# Patient Record
Sex: Female | Born: 1935 | Race: Black or African American | Hispanic: No | Smoking: Never smoker
Health system: Southern US, Community
[De-identification: ages and names within clinical notes are randomized; demographics above are authoritative.]

## PROBLEM LIST (undated history)

## (undated) DIAGNOSIS — R062 Wheezing: Secondary | ICD-10-CM

## (undated) DIAGNOSIS — I10 Essential (primary) hypertension: Secondary | ICD-10-CM

## (undated) DIAGNOSIS — R6251 Failure to thrive (child): Secondary | ICD-10-CM

## (undated) DIAGNOSIS — E785 Hyperlipidemia, unspecified: Secondary | ICD-10-CM

## (undated) DIAGNOSIS — R0600 Dyspnea, unspecified: Secondary | ICD-10-CM

## (undated) DIAGNOSIS — M810 Age-related osteoporosis without current pathological fracture: Secondary | ICD-10-CM

## (undated) DIAGNOSIS — R1900 Intra-abdominal and pelvic swelling, mass and lump, unspecified site: Secondary | ICD-10-CM

## (undated) DIAGNOSIS — G629 Polyneuropathy, unspecified: Secondary | ICD-10-CM

## (undated) HISTORY — DX: Hyperlipidemia, unspecified: E78.5

## (undated) HISTORY — PX: COLONOSCOPY: SHX174

## (undated) HISTORY — DX: Essential (primary) hypertension: I10

## (undated) HISTORY — PX: ABDOMINAL HYSTERECTOMY: SHX81

## (undated) HISTORY — PX: MULTIPLE TOOTH EXTRACTIONS: SHX2053

## (undated) HISTORY — DX: Age-related osteoporosis without current pathological fracture: M81.0

## (undated) HISTORY — PX: COLON SURGERY: SHX602

---

## 2005-12-18 HISTORY — PX: GLAUCOMA SURGERY: SHX656

## 2009-03-16 ENCOUNTER — Encounter: Admission: RE | Admit: 2009-03-16 | Discharge: 2009-03-16 | Payer: Self-pay | Admitting: Internal Medicine

## 2009-10-04 ENCOUNTER — Ambulatory Visit (HOSPITAL_COMMUNITY): Admission: RE | Admit: 2009-10-04 | Discharge: 2009-10-04 | Payer: Self-pay | Admitting: Ophthalmology

## 2009-12-23 ENCOUNTER — Ambulatory Visit: Payer: Self-pay | Admitting: Family Medicine

## 2010-01-18 HISTORY — PX: CATARACT EXTRACTION: SUR2

## 2010-03-23 ENCOUNTER — Encounter: Admission: RE | Admit: 2010-03-23 | Discharge: 2010-03-23 | Payer: Self-pay | Admitting: Family Medicine

## 2010-03-23 ENCOUNTER — Ambulatory Visit: Payer: Self-pay | Admitting: Family Medicine

## 2010-07-22 ENCOUNTER — Ambulatory Visit: Payer: Self-pay | Admitting: Family Medicine

## 2010-07-29 ENCOUNTER — Encounter: Admission: RE | Admit: 2010-07-29 | Discharge: 2010-07-29 | Payer: Self-pay | Admitting: Family Medicine

## 2010-07-29 ENCOUNTER — Ambulatory Visit: Payer: Self-pay | Admitting: Family Medicine

## 2010-11-07 ENCOUNTER — Ambulatory Visit: Payer: Self-pay | Admitting: Family Medicine

## 2011-03-08 ENCOUNTER — Other Ambulatory Visit: Payer: Self-pay | Admitting: Family Medicine

## 2011-03-08 DIAGNOSIS — Z1231 Encounter for screening mammogram for malignant neoplasm of breast: Secondary | ICD-10-CM

## 2011-04-04 ENCOUNTER — Ambulatory Visit
Admission: RE | Admit: 2011-04-04 | Discharge: 2011-04-04 | Disposition: A | Discharge status: Home / Self Care | Payer: Medicare Other | Source: Ambulatory Visit | Attending: Family Medicine | Admitting: Family Medicine

## 2011-04-04 DIAGNOSIS — Z1231 Encounter for screening mammogram for malignant neoplasm of breast: Secondary | ICD-10-CM

## 2011-04-13 ENCOUNTER — Encounter: Payer: Self-pay | Admitting: Family Medicine

## 2011-05-08 ENCOUNTER — Ambulatory Visit (INDEPENDENT_AMBULATORY_CARE_PROVIDER_SITE_OTHER): Payer: Medicare Other | Admitting: Family Medicine

## 2011-05-08 ENCOUNTER — Encounter: Payer: Self-pay | Admitting: Family Medicine

## 2011-05-08 DIAGNOSIS — E785 Hyperlipidemia, unspecified: Secondary | ICD-10-CM

## 2011-05-08 DIAGNOSIS — E1169 Type 2 diabetes mellitus with other specified complication: Secondary | ICD-10-CM

## 2011-05-08 DIAGNOSIS — I10 Essential (primary) hypertension: Secondary | ICD-10-CM

## 2011-05-08 DIAGNOSIS — E119 Type 2 diabetes mellitus without complications: Secondary | ICD-10-CM

## 2011-05-08 DIAGNOSIS — E1159 Type 2 diabetes mellitus with other circulatory complications: Secondary | ICD-10-CM

## 2011-05-08 LAB — POCT GLYCOSYLATED HEMOGLOBIN (HGB A1C): Hemoglobin A1C: 6.1

## 2011-05-08 MED ORDER — MAGNESIUM OXIDE 400 MG PO TABS: 400.0000 mg | ORAL_TABLET | Freq: Every day | ORAL | Status: AC

## 2011-05-08 MED ORDER — GLIMEPIRIDE 2 MG PO TABS: 2.0000 mg | ORAL_TABLET | ORAL | Status: DC

## 2011-05-08 MED ORDER — PRAVASTATIN SODIUM 40 MG PO TABS: 40.0000 mg | ORAL_TABLET | Freq: Every evening | ORAL | Status: DC

## 2011-05-08 MED ORDER — METFORMIN HCL 1000 MG PO TABS: 1000.0000 mg | ORAL_TABLET | Freq: Every day | ORAL | Status: DC

## 2011-05-08 MED ORDER — VALSARTAN-HYDROCHLOROTHIAZIDE 160-12.5 MG PO TABS: 1.0000 | ORAL_TABLET | Freq: Every day | ORAL | Status: DC

## 2011-05-08 NOTE — Progress Notes (Signed)
Subjective:    Patient ID: Theresa Howe, female    DOB: [birth year], 77 y.o.   MRN: [id number]  HPI she is here for recheck on her diabetes. Her medications were reviewed. She is having no difficulty with them. She checks her blood sugars daily and they run below 100. She has had an exam within the last several months. She checks her feet regularly. She does not smoke or drink. She walks 2 miles a day. She is been retired since 1998    Review of Systems negative except as above     Objective:   Physical Exam alert and in no distress otherwise not examined        Assessment & Plan:  Diabetes. Hyperlipidemia. Hypertension Will switch her to Diovan HCT since there is no good reason when she is on spironolactone . Continue on her other medications. Recheck here in 4 months.

## 2011-05-08 NOTE — Patient Instructions (Signed)
Stop the Aldactone. I will switch to  Diovan/hct. I will renew all your other medications. Checking your feet and your blood sugar

## 2011-09-08 ENCOUNTER — Ambulatory Visit: Payer: No Typology Code available for payment source | Admitting: Family Medicine

## 2011-09-18 ENCOUNTER — Ambulatory Visit (INDEPENDENT_AMBULATORY_CARE_PROVIDER_SITE_OTHER): Payer: Medicare Other | Admitting: Family Medicine

## 2011-09-18 ENCOUNTER — Encounter: Payer: Self-pay | Admitting: Family Medicine

## 2011-09-18 ENCOUNTER — Ambulatory Visit: Payer: No Typology Code available for payment source | Admitting: Family Medicine

## 2011-09-18 DIAGNOSIS — H409 Unspecified glaucoma: Secondary | ICD-10-CM | POA: Insufficient documentation

## 2011-09-18 DIAGNOSIS — E1159 Type 2 diabetes mellitus with other circulatory complications: Secondary | ICD-10-CM

## 2011-09-18 DIAGNOSIS — IMO0001 Reserved for inherently not codable concepts without codable children: Secondary | ICD-10-CM | POA: Insufficient documentation

## 2011-09-18 DIAGNOSIS — E785 Hyperlipidemia, unspecified: Secondary | ICD-10-CM

## 2011-09-18 DIAGNOSIS — I10 Essential (primary) hypertension: Secondary | ICD-10-CM | POA: Insufficient documentation

## 2011-09-18 DIAGNOSIS — Z79899 Other long term (current) drug therapy: Secondary | ICD-10-CM

## 2011-09-18 DIAGNOSIS — Z23 Encounter for immunization: Secondary | ICD-10-CM

## 2011-09-18 DIAGNOSIS — E1169 Type 2 diabetes mellitus with other specified complication: Secondary | ICD-10-CM

## 2011-09-18 DIAGNOSIS — E119 Type 2 diabetes mellitus without complications: Secondary | ICD-10-CM

## 2011-09-18 LAB — CBC WITH DIFFERENTIAL/PLATELET
Eosinophils Absolute: 0 10*3/uL (ref 0.0–0.7)
Eosinophils Relative: 1 % (ref 0–5)
Hemoglobin: 11.8 g/dL — ABNORMAL LOW (ref 12.0–15.0)
Lymphocytes Relative: 41 % (ref 12–46)
Lymphs Abs: 1.3 10*3/uL (ref 0.7–4.0)
Monocytes Absolute: 0.2 10*3/uL (ref 0.1–1.0)
Neutrophils Relative %: 50 % (ref 43–77)
Platelets: 269 10*3/uL (ref 150–400)
RBC: 4.2 MIL/uL (ref 3.87–5.11)
WBC: 3.3 10*3/uL — ABNORMAL LOW (ref 4.0–10.5)

## 2011-09-18 LAB — POCT UA - MICROALBUMIN
Albumin/Creatinine Ratio, Urine, POC: <16.6
Microalbumin Ur, POC: <5 mg/dL

## 2011-09-18 LAB — POCT GLYCOSYLATED HEMOGLOBIN (HGB A1C): Hemoglobin A1C: 6.1

## 2011-09-18 NOTE — Progress Notes (Signed)
Subjective:    Patient ID: Theresa Howe, female    DOB: January 25, 1931, 77 y.o.   MRN: [id number]  HPI She is here for a diabetes recheck. She continues on medications listed in the chart. Her blood sugars run quite low. She does not smoke or drink. She keeps himself fairly active. She does check her blood sugars regularly. Does have an appointment to see her eye doctor concerning her glaucoma. She has no other concerns or complaints.   Review of Systems     Objective:   Physical Exam Alert and in no distress. Exam of her feet shows good pulses. Skin is normal. Ankle reflex diminished. Hemoglobin A1c 6.1       Assessment & Plan:   1. Diabetes mellitus  POCT HgB A1C, POCT UA - Microalbumin, CBC with Differential, Comprehensive metabolic panel, Lipid panel  2. Need for prophylactic vaccination and inoculation against influenza  Flu vaccine greater than or equal to 3yo preservative free IM  3. Hypertension associated with diabetes  CBC with Differential, Comprehensive metabolic panel, Lipid panel  4. Hyperlipidemia LDL goal <70  Lipid panel  5. Glaucoma    6. Encounter for long-term (current) use of other medications  CBC with Differential, Comprehensive metabolic panel, Lipid panel   continue to take excellent care of herself. Return here in 4 months

## 2011-09-18 NOTE — Patient Instructions (Signed)
Continue take good care of yourself and stay on your present medication regimen

## 2011-09-19 LAB — LIPID PANEL
Cholesterol: 187 mg/dL (ref 0–200)
LDL Cholesterol: 112 mg/dL — ABNORMAL HIGH (ref 0–99)
Total CHOL/HDL Ratio: 3 Ratio
Triglycerides: 63 mg/dL (ref <150)
VLDL: 13 mg/dL (ref 0–40)

## 2011-09-19 LAB — COMPREHENSIVE METABOLIC PANEL
AST: 21 U/L (ref 0–37)
Albumin: 4.4 g/dL (ref 3.5–5.2)
Alkaline Phosphatase: 57 U/L (ref 39–117)
BUN: 16 mg/dL (ref 6–23)
CO2: 26 mEq/L (ref 19–32)
Creat: 1.02 mg/dL (ref 0.50–1.10)
Glucose, Bld: 81 mg/dL (ref 70–99)

## 2011-09-19 MED ORDER — PRAVASTATIN SODIUM 80 MG PO TABS: 80.0000 mg | ORAL_TABLET | Freq: Every day | ORAL | Status: DC

## 2011-09-19 NOTE — Progress Notes (Signed)
Addended by: Ronnald Nian on: 09/19/2011 01:13 PM   Modules accepted: Orders

## 2011-09-19 NOTE — Progress Notes (Signed)
Subjective:    Patient ID: Theresa Howe, female    DOB: 1931/11/03, 77 y.o.   MRN: [id number]  HPI    Review of Systems     Objective:   Physical Exam        Assessment & Plan:  Lipids are not at goal. I will increase her Pravachol to 80 mg area and recheck with next office visit

## 2011-11-21 ENCOUNTER — Telehealth: Payer: Self-pay | Admitting: Family Medicine

## 2011-11-21 NOTE — Telephone Encounter (Signed)
Left message

## 2012-01-16 ENCOUNTER — Encounter: Payer: Self-pay | Admitting: Internal Medicine

## 2012-01-24 ENCOUNTER — Ambulatory Visit: Payer: Medicare Other | Admitting: Family Medicine

## 2012-01-26 ENCOUNTER — Encounter: Payer: Self-pay | Admitting: Family Medicine

## 2012-01-26 ENCOUNTER — Other Ambulatory Visit: Payer: Self-pay

## 2012-01-26 ENCOUNTER — Ambulatory Visit (INDEPENDENT_AMBULATORY_CARE_PROVIDER_SITE_OTHER): Payer: Medicare Other | Admitting: Family Medicine

## 2012-01-26 DIAGNOSIS — Z23 Encounter for immunization: Secondary | ICD-10-CM | POA: Diagnosis not present

## 2012-01-26 DIAGNOSIS — E1169 Type 2 diabetes mellitus with other specified complication: Secondary | ICD-10-CM

## 2012-01-26 DIAGNOSIS — E119 Type 2 diabetes mellitus without complications: Secondary | ICD-10-CM | POA: Diagnosis not present

## 2012-01-26 DIAGNOSIS — E785 Hyperlipidemia, unspecified: Secondary | ICD-10-CM

## 2012-01-26 DIAGNOSIS — E1159 Type 2 diabetes mellitus with other circulatory complications: Secondary | ICD-10-CM

## 2012-01-26 DIAGNOSIS — H409 Unspecified glaucoma: Secondary | ICD-10-CM

## 2012-01-26 DIAGNOSIS — I10 Essential (primary) hypertension: Secondary | ICD-10-CM

## 2012-01-26 MED ORDER — PRAVASTATIN SODIUM 80 MG PO TABS: 80.0000 mg | ORAL_TABLET | Freq: Every day | ORAL | Status: DC

## 2012-01-26 MED ORDER — VALSARTAN-HYDROCHLOROTHIAZIDE 160-12.5 MG PO TABS: 1.0000 | ORAL_TABLET | Freq: Every day | ORAL | Status: DC

## 2012-01-26 NOTE — Telephone Encounter (Signed)
Pt request 90 day

## 2012-01-26 NOTE — Patient Instructions (Signed)
Keep taking good care of yourself 

## 2012-01-26 NOTE — Progress Notes (Signed)
Subjective:    Patient ID: Theresa Howe, female    DOB: 06-22-1931, 78 y.o.   MRN: [id number]  HPI She is here for a recheck. She walks 2 miles almost everyday. She does check her sugars daily. She gets followup on her glaucoma. She continues on medications listed in the chart. She checks her feet regularly. She continues on medications listed in the chart. She did injure her head approximately one month ago. She states that it was in the mid parietal area. Since then she has had a funny sensation in that part of her head but no increased headache, blurred vision, double vision, change in orientation.   Review of Systems     Objective:   Physical Exam alert and in no distress. Tympanic membranes and canals are normal. Throat is clear. Tonsils are normal. Neck is supple without adenopathy or thyromegaly. Cardiac exam shows a regular sinus rhythm without murmurs or gallops. Lungs are clear to auscultation. DTRs 1+. Foot exam shows 1+ pulses, decreased ankle reflex, normal skin in appearance.        Assessment & Plan:   1. Diabetes mellitus  POCT HgB A1C  2. Glaucoma    3. Hyperlipidemia LDL goal <70    4. Hypertension associated with diabetes     prescription written for Zostavax with instructions on where to get the shot TDaP also given.

## 2012-02-12 ENCOUNTER — Emergency Department (HOSPITAL_COMMUNITY): Payer: Medicare Other

## 2012-02-12 ENCOUNTER — Encounter (HOSPITAL_COMMUNITY): Payer: Self-pay | Admitting: *Deleted

## 2012-02-12 ENCOUNTER — Emergency Department (HOSPITAL_COMMUNITY)
Admission: EM | Admit: 2012-02-12 | Discharge: 2012-02-12 | Disposition: A | Discharge status: Home Health | Payer: Medicare Other | Attending: Emergency Medicine | Admitting: Emergency Medicine

## 2012-02-12 DIAGNOSIS — R51 Headache: Secondary | ICD-10-CM | POA: Insufficient documentation

## 2012-02-12 DIAGNOSIS — Z7982 Long term (current) use of aspirin: Secondary | ICD-10-CM | POA: Diagnosis not present

## 2012-02-12 DIAGNOSIS — Y92009 Unspecified place in unspecified non-institutional (private) residence as the place of occurrence of the external cause: Secondary | ICD-10-CM | POA: Insufficient documentation

## 2012-02-12 DIAGNOSIS — S0990XA Unspecified injury of head, initial encounter: Secondary | ICD-10-CM | POA: Insufficient documentation

## 2012-02-12 DIAGNOSIS — E119 Type 2 diabetes mellitus without complications: Secondary | ICD-10-CM | POA: Insufficient documentation

## 2012-02-12 DIAGNOSIS — Z79899 Other long term (current) drug therapy: Secondary | ICD-10-CM | POA: Diagnosis not present

## 2012-02-12 DIAGNOSIS — H409 Unspecified glaucoma: Secondary | ICD-10-CM | POA: Diagnosis not present

## 2012-02-12 DIAGNOSIS — E785 Hyperlipidemia, unspecified: Secondary | ICD-10-CM | POA: Diagnosis not present

## 2012-02-12 DIAGNOSIS — I10 Essential (primary) hypertension: Secondary | ICD-10-CM | POA: Insufficient documentation

## 2012-02-12 DIAGNOSIS — IMO0002 Reserved for concepts with insufficient information to code with codable children: Secondary | ICD-10-CM | POA: Insufficient documentation

## 2012-02-12 MED ORDER — TETRACAINE HCL 0.5 % OP SOLN
OPHTHALMIC | Status: AC
  Administered 2012-02-12: 17:00:00
  Filled 2012-02-12: qty 2

## 2012-02-12 NOTE — ED Notes (Signed)
Family at bedside. 

## 2012-02-12 NOTE — ED Notes (Signed)
The pt says she fell one month ago and struck her head.  No loc.  She is here for a strange feeling in her head.  Steady gait alert oriented skin warm and dry  No distress

## 2012-02-12 NOTE — ED Notes (Signed)
Patient states she hit her head in her closet over a mth ago.  She has had funny feelings in her head since and is concerned that she may need an xray.  Patient denies dizziness,  Denies confusion.  She states she has pain that radiates into her neck at times.

## 2012-02-12 NOTE — Discharge Instructions (Signed)

## 2012-02-12 NOTE — ED Provider Notes (Signed)
History     CSN: [id number]  Arrival date & time 02/12/12  1219   First MD Initiated Contact with Patient 02/12/12 1559      Chief Complaint  Patient presents with  . Head Injury  . Headache    (Consider location/radiation/quality/duration/timing/severity/associated sxs/prior treatment) Patient is a 78 y.o. female presenting with headaches. The history is provided by the patient.  Headache  This is a recurrent problem. Episode onset: One month ago after hitting her head on a beam in her attic. Episode frequency: Several times a week he states her head feels funny. The problem has not changed since onset.The headache is associated with nothing. Pain location: States it's located on the top of her head. The quality of the pain is described as dull. The pain is at a severity of 5/10. The patient is experiencing no pain. The pain does not radiate. Pertinent negatives include no anorexia, no near-syncope, no palpitations, no shortness of breath, no nausea and no vomiting. She has tried aspirin for the symptoms. The treatment provided significant relief.    Past Medical History  Diagnosis Date  . Hypertension   . Hyperlipidemia   . Glaucoma   . Diabetes mellitus     Past Surgical History  Procedure Date  . Cataract extraction 01/2010    Right Eye    No family history on file.  History  Substance Use Topics  . Smoking status: Never Smoker   . Smokeless tobacco: Never Used  . Alcohol Use: No    OB History    Grav Para Term Preterm Abortions TAB SAB Ect Mult Living                  Review of Systems  Eyes: Negative for visual disturbance.       States she has glaucoma and always has eye issues but nothing different from normal  Respiratory: Negative for shortness of breath.   Cardiovascular: Negative for palpitations and near-syncope.  Gastrointestinal: Negative for nausea, vomiting and anorexia.  Neurological: Positive for headaches.  All other systems reviewed and  are negative.    Allergies  Review of patient's allergies indicates no known allergies.  Home Medications   Current Outpatient Rx  Name Route Sig Dispense Refill  . ASPIRIN 81 MG PO TABS Oral Take 81 mg by mouth daily.      Marland Kitchen CALCIUM 600 + D PO Oral Take 1 tablet by mouth daily.     . DORZOLAMIDE HCL 2 % OP SOLN Both Eyes Place 1 drop into both eyes 2 (two) times daily.    Marland Kitchen GLIMEPIRIDE 2 MG PO TABS Oral Take 1 tablet (2 mg total) by mouth every morning. 90 tablet 1  . LATANOPROST 0.005 % OP SOLN Both Eyes Place 1 drop into both eyes at bedtime.    Marland Kitchen MAGNESIUM OXIDE 400 MG PO TABS Oral Take 1 tablet (400 mg total) by mouth daily. 90 tablet 4  . METFORMIN HCL 1000 MG PO TABS Oral Take 1,000 mg by mouth 2 (two) times daily with a meal.      . CENTRUM SILVER PO Oral Take 1 tablet by mouth daily.     Marland Kitchen PRAVASTATIN SODIUM 80 MG PO TABS Oral Take 80 mg by mouth daily. Patient takes in the afternoon.    Marland Kitchen SPIRONOLACTONE 25 MG PO TABS Oral Take 25 mg by mouth 4 (four) times daily.      Marland Kitchen VALSARTAN-HYDROCHLOROTHIAZIDE 160-12.5 MG PO TABS Oral Take 1 tablet by  mouth daily. Patient takes this in the afternoon.      BP 134/68  Pulse 84  Temp(Src) 97.8 F (36.6 C) (Oral)  Resp 18  SpO2 97%  Physical Exam  Nursing note and vitals reviewed. Constitutional: She is oriented to person, place, and time. She appears well-developed and well-nourished. No distress.  HENT:  Head: Normocephalic and atraumatic.  Right Ear: Tympanic membrane and ear canal normal.  Left Ear: Tympanic membrane and ear canal normal.  Mouth/Throat: Oropharynx is clear and moist.  Eyes: EOM are normal.       Abnormal pupil on the right.  Intraocular pressures 13 on the right and 11 on the left  Neck: Normal range of motion. Neck supple. No JVD present. Carotid bruit is not present.  Cardiovascular: Normal rate, regular rhythm, normal heart sounds and intact distal pulses.  Exam reveals no friction rub.   No murmur  heard. Pulmonary/Chest: Effort normal and breath sounds normal. She has no wheezes. She has no rales.  Abdominal: Soft. Bowel sounds are normal. She exhibits no distension. There is no tenderness. There is no rebound and no guarding.  Musculoskeletal: Normal range of motion. She exhibits no tenderness.       No edema  Neurological: She is alert and oriented to person, place, and time. She has normal strength. No cranial nerve deficit or sensory deficit. She displays a negative Romberg sign. Coordination and gait normal.  Skin: Skin is warm and dry. No rash noted.  Psychiatric: She has a normal mood and affect. Her behavior is normal.    ED Course  Procedures (including critical care time)  Labs Reviewed - No data to display Ct Head Wo Contrast  02/12/2012  *RADIOLOGY REPORT*  Clinical Data: Head trauma.  Strange feeling and head.  CT HEAD WITHOUT CONTRAST  Technique:  Contiguous axial images were obtained from the base of the skull through the vertex without contrast.  Comparison: 07/29/2010  Findings: The brain shows mild age related atrophy.  There is no evidence of old or acute focal small or large vessel infarction. No evidence of mass lesion, hemorrhage, hydrocephalus or extra- axial collection.  No skull fracture.  No fluid in the sinuses, middle ears or mastoids.  There is atherosclerotic calcification of the major vessels at the base of the brain.  IMPRESSION: Negative head CT for age.  No evidence of acute, reversible or traumatic finding.  Original Report Authenticated By: Thomasenia Sales, M.D.     1. Headache       MDM   Patient presented to intermittent head pain and feeling strange after hitting her head one month ago. She is not on any anticoagulation therapy and has no symptoms suggestive of stroke or focal deficits. Her head CT is negative for bleeding or increased intercranial pressure. 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 She still taking  her carcoma drops to bilateral eyes. Her pressures today are normal and bilateral eyes at 13 in the right and 11 and the left. She asked to be referred to Dr. Karleen Hampshire as she's heard good things about him. She was given the referral. Otherwise given normal tests and normal vital signs a normal exam will discharge home.        Gwyneth Sprout, MD 02/12/12 [phone]

## 2012-02-14 DIAGNOSIS — H538 Other visual disturbances: Secondary | ICD-10-CM | POA: Diagnosis not present

## 2012-02-14 DIAGNOSIS — H2510 Age-related nuclear cataract, unspecified eye: Secondary | ICD-10-CM | POA: Diagnosis not present

## 2012-02-14 DIAGNOSIS — H4011X0 Primary open-angle glaucoma, stage unspecified: Secondary | ICD-10-CM | POA: Diagnosis not present

## 2012-03-11 ENCOUNTER — Other Ambulatory Visit: Payer: Self-pay | Admitting: Family Medicine

## 2012-03-11 DIAGNOSIS — Z1231 Encounter for screening mammogram for malignant neoplasm of breast: Secondary | ICD-10-CM

## 2012-05-02 DIAGNOSIS — E785 Hyperlipidemia, unspecified: Secondary | ICD-10-CM | POA: Diagnosis not present

## 2012-05-02 DIAGNOSIS — E109 Type 1 diabetes mellitus without complications: Secondary | ICD-10-CM | POA: Diagnosis not present

## 2012-05-02 DIAGNOSIS — R29890 Loss of height: Secondary | ICD-10-CM | POA: Diagnosis not present

## 2012-05-02 DIAGNOSIS — E119 Type 2 diabetes mellitus without complications: Secondary | ICD-10-CM | POA: Diagnosis not present

## 2012-05-02 DIAGNOSIS — Z780 Asymptomatic menopausal state: Secondary | ICD-10-CM | POA: Diagnosis not present

## 2012-05-02 DIAGNOSIS — I10 Essential (primary) hypertension: Secondary | ICD-10-CM | POA: Diagnosis not present

## 2012-05-02 DIAGNOSIS — N3090 Cystitis, unspecified without hematuria: Secondary | ICD-10-CM | POA: Diagnosis not present

## 2012-05-07 DIAGNOSIS — E785 Hyperlipidemia, unspecified: Secondary | ICD-10-CM | POA: Diagnosis not present

## 2012-05-07 DIAGNOSIS — E119 Type 2 diabetes mellitus without complications: Secondary | ICD-10-CM | POA: Diagnosis not present

## 2012-05-07 DIAGNOSIS — Z780 Asymptomatic menopausal state: Secondary | ICD-10-CM | POA: Diagnosis not present

## 2012-05-07 DIAGNOSIS — N3090 Cystitis, unspecified without hematuria: Secondary | ICD-10-CM | POA: Diagnosis not present

## 2012-05-07 DIAGNOSIS — I10 Essential (primary) hypertension: Secondary | ICD-10-CM | POA: Diagnosis not present

## 2012-05-07 DIAGNOSIS — E109 Type 1 diabetes mellitus without complications: Secondary | ICD-10-CM | POA: Diagnosis not present

## 2012-05-07 DIAGNOSIS — R29890 Loss of height: Secondary | ICD-10-CM | POA: Diagnosis not present

## 2012-05-13 DIAGNOSIS — E109 Type 1 diabetes mellitus without complications: Secondary | ICD-10-CM | POA: Diagnosis not present

## 2012-05-13 DIAGNOSIS — N3090 Cystitis, unspecified without hematuria: Secondary | ICD-10-CM | POA: Diagnosis not present

## 2012-05-13 DIAGNOSIS — E785 Hyperlipidemia, unspecified: Secondary | ICD-10-CM | POA: Diagnosis not present

## 2012-05-13 DIAGNOSIS — I10 Essential (primary) hypertension: Secondary | ICD-10-CM | POA: Diagnosis not present

## 2012-05-13 DIAGNOSIS — E119 Type 2 diabetes mellitus without complications: Secondary | ICD-10-CM | POA: Diagnosis not present

## 2012-05-13 DIAGNOSIS — Z780 Asymptomatic menopausal state: Secondary | ICD-10-CM | POA: Diagnosis not present

## 2012-05-13 DIAGNOSIS — R29890 Loss of height: Secondary | ICD-10-CM | POA: Diagnosis not present

## 2012-05-14 ENCOUNTER — Other Ambulatory Visit: Payer: Self-pay | Admitting: Internal Medicine

## 2012-05-14 DIAGNOSIS — Z1231 Encounter for screening mammogram for malignant neoplasm of breast: Secondary | ICD-10-CM

## 2012-05-16 ENCOUNTER — Ambulatory Visit
Admission: RE | Admit: 2012-05-16 | Discharge: 2012-05-16 | Disposition: A | Discharge status: Home / Self Care | Payer: Medicare Other | Source: Ambulatory Visit | Attending: Internal Medicine | Admitting: Internal Medicine

## 2012-05-16 DIAGNOSIS — Z1231 Encounter for screening mammogram for malignant neoplasm of breast: Secondary | ICD-10-CM | POA: Diagnosis not present

## 2012-05-23 ENCOUNTER — Telehealth: Payer: Self-pay | Admitting: Family Medicine

## 2012-05-24 ENCOUNTER — Ambulatory Visit: Payer: Medicare Other | Admitting: Family Medicine

## 2012-05-27 NOTE — Telephone Encounter (Signed)
FYI

## 2012-05-28 DIAGNOSIS — Z1211 Encounter for screening for malignant neoplasm of colon: Secondary | ICD-10-CM | POA: Diagnosis not present

## 2012-05-29 ENCOUNTER — Telehealth: Payer: Self-pay | Admitting: Family Medicine

## 2012-05-29 NOTE — Telephone Encounter (Signed)
LM

## 2012-06-06 ENCOUNTER — Ambulatory Visit: Payer: Medicare Other

## 2012-06-06 DIAGNOSIS — Z1211 Encounter for screening for malignant neoplasm of colon: Secondary | ICD-10-CM | POA: Diagnosis not present

## 2012-06-06 DIAGNOSIS — K648 Other hemorrhoids: Secondary | ICD-10-CM | POA: Diagnosis not present

## 2012-08-15 DIAGNOSIS — H2510 Age-related nuclear cataract, unspecified eye: Secondary | ICD-10-CM | POA: Diagnosis not present

## 2012-08-15 DIAGNOSIS — H4011X0 Primary open-angle glaucoma, stage unspecified: Secondary | ICD-10-CM | POA: Diagnosis not present

## 2012-08-20 DIAGNOSIS — E785 Hyperlipidemia, unspecified: Secondary | ICD-10-CM | POA: Diagnosis not present

## 2012-08-20 DIAGNOSIS — E119 Type 2 diabetes mellitus without complications: Secondary | ICD-10-CM | POA: Diagnosis not present

## 2012-08-20 DIAGNOSIS — H409 Unspecified glaucoma: Secondary | ICD-10-CM | POA: Diagnosis not present

## 2012-08-20 DIAGNOSIS — I10 Essential (primary) hypertension: Secondary | ICD-10-CM | POA: Diagnosis not present

## 2012-08-20 DIAGNOSIS — E782 Mixed hyperlipidemia: Secondary | ICD-10-CM | POA: Diagnosis not present

## 2012-09-05 DIAGNOSIS — I10 Essential (primary) hypertension: Secondary | ICD-10-CM | POA: Diagnosis not present

## 2012-09-05 DIAGNOSIS — Z79899 Other long term (current) drug therapy: Secondary | ICD-10-CM | POA: Diagnosis not present

## 2012-09-05 DIAGNOSIS — L02519 Cutaneous abscess of unspecified hand: Secondary | ICD-10-CM | POA: Diagnosis not present

## 2012-09-05 DIAGNOSIS — T6391XA Toxic effect of contact with unspecified venomous animal, accidental (unintentional), initial encounter: Secondary | ICD-10-CM | POA: Diagnosis not present

## 2012-09-05 DIAGNOSIS — L03119 Cellulitis of unspecified part of limb: Secondary | ICD-10-CM | POA: Diagnosis not present

## 2012-09-05 DIAGNOSIS — Z7982 Long term (current) use of aspirin: Secondary | ICD-10-CM | POA: Diagnosis not present

## 2012-09-05 DIAGNOSIS — E119 Type 2 diabetes mellitus without complications: Secondary | ICD-10-CM | POA: Diagnosis not present

## 2012-11-19 DIAGNOSIS — E119 Type 2 diabetes mellitus without complications: Secondary | ICD-10-CM | POA: Diagnosis not present

## 2012-11-19 DIAGNOSIS — M549 Dorsalgia, unspecified: Secondary | ICD-10-CM | POA: Diagnosis not present

## 2012-11-19 DIAGNOSIS — I10 Essential (primary) hypertension: Secondary | ICD-10-CM | POA: Diagnosis not present

## 2012-11-19 DIAGNOSIS — E785 Hyperlipidemia, unspecified: Secondary | ICD-10-CM | POA: Diagnosis not present

## 2012-11-21 DIAGNOSIS — Z23 Encounter for immunization: Secondary | ICD-10-CM | POA: Diagnosis not present

## 2012-12-26 DIAGNOSIS — E119 Type 2 diabetes mellitus without complications: Secondary | ICD-10-CM | POA: Diagnosis not present

## 2012-12-26 DIAGNOSIS — E785 Hyperlipidemia, unspecified: Secondary | ICD-10-CM | POA: Diagnosis not present

## 2012-12-26 DIAGNOSIS — I10 Essential (primary) hypertension: Secondary | ICD-10-CM | POA: Diagnosis not present

## 2012-12-26 DIAGNOSIS — R1031 Right lower quadrant pain: Secondary | ICD-10-CM | POA: Diagnosis not present

## 2013-01-02 DIAGNOSIS — R109 Unspecified abdominal pain: Secondary | ICD-10-CM | POA: Diagnosis not present

## 2013-02-07 DIAGNOSIS — R109 Unspecified abdominal pain: Secondary | ICD-10-CM | POA: Diagnosis not present

## 2013-02-07 DIAGNOSIS — C250 Malignant neoplasm of head of pancreas: Secondary | ICD-10-CM | POA: Diagnosis not present

## 2013-02-14 DIAGNOSIS — H2510 Age-related nuclear cataract, unspecified eye: Secondary | ICD-10-CM | POA: Diagnosis not present

## 2013-02-14 DIAGNOSIS — H4011X0 Primary open-angle glaucoma, stage unspecified: Secondary | ICD-10-CM | POA: Diagnosis not present

## 2013-02-17 DIAGNOSIS — R109 Unspecified abdominal pain: Secondary | ICD-10-CM | POA: Diagnosis not present

## 2013-03-03 DIAGNOSIS — R933 Abnormal findings on diagnostic imaging of other parts of digestive tract: Secondary | ICD-10-CM | POA: Diagnosis not present

## 2013-03-03 DIAGNOSIS — K319 Disease of stomach and duodenum, unspecified: Secondary | ICD-10-CM | POA: Diagnosis not present

## 2013-03-03 DIAGNOSIS — R634 Abnormal weight loss: Secondary | ICD-10-CM | POA: Diagnosis not present

## 2013-03-05 DIAGNOSIS — R634 Abnormal weight loss: Secondary | ICD-10-CM | POA: Diagnosis not present

## 2013-03-05 DIAGNOSIS — R109 Unspecified abdominal pain: Secondary | ICD-10-CM | POA: Diagnosis not present

## 2013-03-18 DIAGNOSIS — R109 Unspecified abdominal pain: Secondary | ICD-10-CM | POA: Diagnosis not present

## 2013-03-24 ENCOUNTER — Other Ambulatory Visit: Payer: Self-pay

## 2013-03-24 ENCOUNTER — Telehealth: Payer: Self-pay

## 2013-03-24 DIAGNOSIS — R933 Abnormal findings on diagnostic imaging of other parts of digestive tract: Secondary | ICD-10-CM

## 2013-03-24 NOTE — Telephone Encounter (Signed)
Instructions have been mailed

## 2013-03-24 NOTE — Telephone Encounter (Signed)
No answer and no voice mail.

## 2013-03-25 NOTE — Telephone Encounter (Signed)
No answer and voicemail has not been set up

## 2013-03-26 NOTE — Telephone Encounter (Signed)
Pt has been instructed and meds reviewed pt will call with any questions after reviewing the information that was mailed

## 2013-03-27 ENCOUNTER — Other Ambulatory Visit: Payer: Self-pay | Admitting: Internal Medicine

## 2013-03-27 DIAGNOSIS — I10 Essential (primary) hypertension: Secondary | ICD-10-CM | POA: Diagnosis not present

## 2013-03-27 DIAGNOSIS — E119 Type 2 diabetes mellitus without complications: Secondary | ICD-10-CM | POA: Diagnosis not present

## 2013-03-27 DIAGNOSIS — E785 Hyperlipidemia, unspecified: Secondary | ICD-10-CM | POA: Diagnosis not present

## 2013-03-27 DIAGNOSIS — Z1231 Encounter for screening mammogram for malignant neoplasm of breast: Secondary | ICD-10-CM

## 2013-03-31 ENCOUNTER — Encounter (HOSPITAL_COMMUNITY): Payer: Self-pay | Admitting: *Deleted

## 2013-04-02 DIAGNOSIS — M79609 Pain in unspecified limb: Secondary | ICD-10-CM | POA: Diagnosis not present

## 2013-04-02 DIAGNOSIS — M2040 Other hammer toe(s) (acquired), unspecified foot: Secondary | ICD-10-CM | POA: Diagnosis not present

## 2013-04-11 ENCOUNTER — Encounter (HOSPITAL_COMMUNITY): Payer: Self-pay | Admitting: Pharmacy Technician

## 2013-04-17 ENCOUNTER — Encounter (HOSPITAL_COMMUNITY): Payer: Self-pay

## 2013-04-17 ENCOUNTER — Encounter (HOSPITAL_COMMUNITY): Payer: Self-pay | Admitting: Registered Nurse

## 2013-04-17 ENCOUNTER — Ambulatory Visit (HOSPITAL_COMMUNITY): Payer: Medicare Other | Admitting: Registered Nurse

## 2013-04-17 ENCOUNTER — Encounter (HOSPITAL_COMMUNITY)
Admission: RE | Disposition: A | Discharge status: Home / Self Care | Payer: Self-pay | Source: Ambulatory Visit | Attending: Gastroenterology

## 2013-04-17 ENCOUNTER — Ambulatory Visit (HOSPITAL_COMMUNITY)
Admission: RE | Admit: 2013-04-17 | Discharge: 2013-04-17 | Disposition: A | Discharge status: Home / Self Care | Payer: Medicare Other | Source: Ambulatory Visit | Attending: Gastroenterology | Admitting: Gastroenterology

## 2013-04-17 ENCOUNTER — Other Ambulatory Visit: Payer: Self-pay | Admitting: Family Medicine

## 2013-04-17 DIAGNOSIS — K8689 Other specified diseases of pancreas: Secondary | ICD-10-CM | POA: Insufficient documentation

## 2013-04-17 DIAGNOSIS — I10 Essential (primary) hypertension: Secondary | ICD-10-CM | POA: Insufficient documentation

## 2013-04-17 DIAGNOSIS — H409 Unspecified glaucoma: Secondary | ICD-10-CM | POA: Insufficient documentation

## 2013-04-17 DIAGNOSIS — R933 Abnormal findings on diagnostic imaging of other parts of digestive tract: Secondary | ICD-10-CM | POA: Diagnosis not present

## 2013-04-17 DIAGNOSIS — Q441 Other congenital malformations of gallbladder: Secondary | ICD-10-CM | POA: Insufficient documentation

## 2013-04-17 DIAGNOSIS — E119 Type 2 diabetes mellitus without complications: Secondary | ICD-10-CM | POA: Diagnosis not present

## 2013-04-17 DIAGNOSIS — E785 Hyperlipidemia, unspecified: Secondary | ICD-10-CM | POA: Insufficient documentation

## 2013-04-17 DIAGNOSIS — Q4479 Other congenital malformations of liver: Secondary | ICD-10-CM | POA: Insufficient documentation

## 2013-04-17 DIAGNOSIS — R932 Abnormal findings on diagnostic imaging of liver and biliary tract: Secondary | ICD-10-CM

## 2013-04-17 HISTORY — PX: EUS: SHX5427

## 2013-04-17 LAB — GLUCOSE, CAPILLARY: Glucose-Capillary: 91 mg/dL (ref 70–99)

## 2013-04-17 SURGERY — UPPER ENDOSCOPIC ULTRASOUND (EUS) LINEAR
Anesthesia: Monitor Anesthesia Care

## 2013-04-17 MED ORDER — LIDOCAINE HCL (CARDIAC) 20 MG/ML IV SOLN
INTRAVENOUS | Status: DC | PRN
  Administered 2013-04-17: 30 mg via INTRAVENOUS

## 2013-04-17 MED ORDER — GLYCOPYRROLATE 0.2 MG/ML IJ SOLN
INTRAMUSCULAR | Status: DC | PRN
  Administered 2013-04-17: 0.2 mg via INTRAVENOUS

## 2013-04-17 MED ORDER — SODIUM CHLORIDE 0.9 % IV SOLN: INTRAVENOUS | Status: DC

## 2013-04-17 MED ORDER — PROPOFOL 10 MG/ML IV EMUL
INTRAVENOUS | Status: DC | PRN
  Administered 2013-04-17: 30 mg via INTRAVENOUS

## 2013-04-17 MED ORDER — FENTANYL CITRATE 0.05 MG/ML IJ SOLN
INTRAMUSCULAR | Status: DC | PRN
  Administered 2013-04-17: 50 ug via INTRAVENOUS

## 2013-04-17 MED ORDER — PROPOFOL INFUSION 10 MG/ML OPTIME
INTRAVENOUS | Status: DC | PRN
  Administered 2013-04-17: 120 ug/kg/min via INTRAVENOUS

## 2013-04-17 MED ORDER — LACTATED RINGERS IV SOLN
INTRAVENOUS | Status: DC
  Administered 2013-04-17: 09:00:00 via INTRAVENOUS

## 2013-04-17 MED ORDER — MIDAZOLAM HCL 5 MG/5ML IJ SOLN
INTRAMUSCULAR | Status: DC | PRN
  Administered 2013-04-17: 1 mg via INTRAVENOUS

## 2013-04-17 NOTE — Op Note (Signed)
[facility] [address] [place], [address]   ENDOSCOPIC ULTRASOUND PROCEDURE REPORT  PATIENT: Theresa Howe, Theresa Howe  MR#: [id number] BIRTHDATE: [birth year]  GENDER: Female ENDOSCOPIST: Rachael Fee, MD REFERRED BY:  Lurline Del, MD PROCEDURE DATE:  04/17/2013 PROCEDURE:   Upper EUS ASA CLASS:      Class II INDICATIONS:   incidentally noted dilated PD, slightly dilated CBD; normal LFTs, normal CA 19-9; no weight loss, no previous personal or familial pancreatic disease. MEDICATIONS: MAC sedation, administered by CRNA  DESCRIPTION OF PROCEDURE:   After the risks benefits and alternatives of the procedure were  explained, informed consent was obtained. The patient was then placed in the left, lateral, decubitus postion and IV sedation was administered. Throughout the procedure, the patients blood pressure, pulse and oxygen saturations were monitored continuously.  Under direct visualization, the Pentax EUS Radial [id number]  endoscope was introduced through the mouth  and advanced to the second portion of the duodenum .  Water was used as necessary to provide an acoustic interface.  Upon completion of the imaging, water was removed and the patient was sent to the recovery room in satisfactory condition.   Endoscopic findings (with linear echoendoscope and standard duodenoscope): 1. Normal UGI tract including very good views of normal major papilla  EUS findings: 1. The main pancreatic duct was slightly dilated diffusely, into the head of pancreas. This measured 5.74mm in neck, body. There were no filling defects within the PD and no associated solid masses. 2. Uncinate pancreas and head of pancreas had mild lobularity with hyperechoic stranding. 3. No discrete pancreatic masses 4. CBD was normal; non-dilated and contained no stones 5. No peripancreatic adenopathy 6. Limited views of liver, spleen, portal and splenic vessels were all  normal  Impression: Main pancreatic duct was diffusely, slightly dilated without any signficant associated pathology. The parenchyma in head/uncinate may have some chronic scarring (mild chronic pancreatitis).  Since she has normal LFTs, no symptoms I recommend following her clinically.    _______________________________ eSignedRachael Fee, MD 04/17/2013 9:34 AM

## 2013-04-17 NOTE — H&P (Signed)
  HPI: This is a 79 yo woman, with chronic RLQ pain. Had CT, outside report states "dilation of the proximal pancreatic ducty measuring 5mm.  slightl prominence of the distal CBD"    Past Medical History  Diagnosis Date  . Hypertension   . Hyperlipidemia   . Glaucoma   . Diabetes mellitus     Past Surgical History  Procedure Laterality Date  . Cataract extraction  01/2010    Right Eye  . Glaucoma sugery  2007  . Abdominal hysterectomy      Current Facility-Administered Medications  Medication Dose Route Frequency Provider Last Rate Last Dose  . 0.9 %  sodium chloride infusion   Intravenous Continuous Rachael Fee, MD        Allergies as of 03/24/2013  . (No Known Allergies)    History reviewed. No pertinent family history.  History   Social History  . Marital Status: Widowed    Spouse Name: N/A    Number of Children: N/A  . Years of Education: N/A   Occupational History  . Not on file.   Social History Main Topics  . Smoking status: Never Smoker   . Smokeless tobacco: Never Used  . Alcohol Use: No  . Drug Use: No  . Sexually Active: Not Currently   Other Topics Concern  . Not on file   Social History Narrative  . No narrative on file      Physical Exam: BP 134/75  Pulse 77  Temp(Src) 98 F (36.7 C)  Resp 10  Ht 5\' 5"  (1.651 m)  Wt 138 lb (62.596 kg)  BMI 22.96 kg/m2  SpO2 98% Constitutional: generally well-appearing Psychiatric: alert and oriented x3 Abdomen: soft, nontender, nondistended, no obvious ascites, no peritoneal signs, normal bowel sounds     Assessment and plan: 79 y.o. female with slightly dilated pd and cbd  Normal lfts, normal ca 19 9.  For eus today

## 2013-04-17 NOTE — Anesthesia Preprocedure Evaluation (Signed)
Anesthesia Evaluation  Patient identified by MRN, date of birth, ID band Patient awake    Reviewed: Allergy & Precautions, H&P , NPO status , Patient's Chart, lab work & pertinent test results  Airway Mallampati: II TM Distance: >3 FB Neck ROM: Full    Dental  (+) Dental Advisory Given   Pulmonary neg pulmonary ROS,  breath sounds clear to auscultation  Pulmonary exam normal       Cardiovascular hypertension, Rhythm:Regular Rate:Normal     Neuro/Psych negative neurological ROS  negative psych ROS   GI/Hepatic negative GI ROS, Neg liver ROS,   Endo/Other  diabetes, Type 2, Oral Hypoglycemic Agents  Renal/GU negative Renal ROS  negative genitourinary   Musculoskeletal negative musculoskeletal ROS (+)   Abdominal   Peds  Hematology negative hematology ROS (+)   Anesthesia Other Findings   Reproductive/Obstetrics                           Anesthesia Physical Anesthesia Plan  ASA: II  Anesthesia Plan: MAC   Post-op Pain Management:    Induction: Intravenous  Airway Management Planned: Nasal Cannula  Additional Equipment:   Intra-op Plan:   Post-operative Plan:   Informed Consent:   Dental advisory given  Plan Discussed with: CRNA  Anesthesia Plan Comments:         Anesthesia Quick Evaluation

## 2013-04-17 NOTE — Transfer of Care (Signed)
Immediate Anesthesia Transfer of Care Note  Patient: Theresa Howe  Procedure(s) Performed: Procedure(s): UPPER ENDOSCOPIC ULTRASOUND (EUS) LINEAR (N/A)  Patient Location: PACU  Anesthesia Type:MAC  Level of Consciousness: awake, alert , oriented and patient cooperative  Airway & Oxygen Therapy: Patient Spontanous Breathing and Patient connected to nasal cannula oxygen  Post-op Assessment: Report given to PACU RN, Post -op Vital signs reviewed and stable and Patient moving all extremities X 4  Post vital signs: stable  Complications: No apparent anesthesia complications

## 2013-04-17 NOTE — Anesthesia Postprocedure Evaluation (Signed)
Anesthesia Post Note  Patient: Theresa Howe  Procedure(s) Performed: Procedure(s) (LRB): UPPER ENDOSCOPIC ULTRASOUND (EUS) LINEAR (N/A)  Anesthesia type: MAC  Patient location: PACU  Post pain: Pain level controlled  Post assessment: Post-op Vital signs reviewed  Last Vitals:  Filed Vitals:   04/17/13 0954  BP: 93/63  Pulse:   Temp:   Resp: 12    Post vital signs: Reviewed  Level of consciousness: sedated  Complications: No apparent anesthesia complications

## 2013-04-18 ENCOUNTER — Encounter (HOSPITAL_COMMUNITY): Payer: Self-pay | Admitting: Gastroenterology

## 2013-04-18 LAB — POCT I-STAT 4, (NA,K, GLUC, HGB,HCT): Sodium: 140 mEq/L (ref 135–145)

## 2013-04-19 ENCOUNTER — Other Ambulatory Visit: Payer: Self-pay | Admitting: Family Medicine

## 2013-04-21 DIAGNOSIS — R0989 Other specified symptoms and signs involving the circulatory and respiratory systems: Secondary | ICD-10-CM | POA: Diagnosis not present

## 2013-04-21 DIAGNOSIS — R011 Cardiac murmur, unspecified: Secondary | ICD-10-CM | POA: Diagnosis not present

## 2013-05-19 ENCOUNTER — Ambulatory Visit
Admission: RE | Admit: 2013-05-19 | Discharge: 2013-05-19 | Disposition: A | Discharge status: Home / Self Care | Payer: Medicare Other | Source: Ambulatory Visit | Attending: Internal Medicine | Admitting: Internal Medicine

## 2013-05-19 DIAGNOSIS — Z1231 Encounter for screening mammogram for malignant neoplasm of breast: Secondary | ICD-10-CM | POA: Diagnosis not present

## 2013-05-20 ENCOUNTER — Other Ambulatory Visit: Payer: Self-pay | Admitting: Family Medicine

## 2013-06-24 ENCOUNTER — Other Ambulatory Visit: Payer: Self-pay | Admitting: Family Medicine

## 2013-06-26 DIAGNOSIS — I10 Essential (primary) hypertension: Secondary | ICD-10-CM | POA: Diagnosis not present

## 2013-06-26 DIAGNOSIS — E785 Hyperlipidemia, unspecified: Secondary | ICD-10-CM | POA: Diagnosis not present

## 2013-06-26 DIAGNOSIS — E119 Type 2 diabetes mellitus without complications: Secondary | ICD-10-CM | POA: Diagnosis not present

## 2013-07-02 DIAGNOSIS — E119 Type 2 diabetes mellitus without complications: Secondary | ICD-10-CM | POA: Diagnosis not present

## 2013-07-02 DIAGNOSIS — E785 Hyperlipidemia, unspecified: Secondary | ICD-10-CM | POA: Diagnosis not present

## 2013-07-02 DIAGNOSIS — I10 Essential (primary) hypertension: Secondary | ICD-10-CM | POA: Diagnosis not present

## 2013-07-21 ENCOUNTER — Other Ambulatory Visit: Payer: Self-pay | Admitting: Family Medicine

## 2013-08-01 ENCOUNTER — Encounter: Payer: Self-pay | Admitting: Family Medicine

## 2013-08-01 ENCOUNTER — Ambulatory Visit (INDEPENDENT_AMBULATORY_CARE_PROVIDER_SITE_OTHER): Payer: Medicare Other | Admitting: Family Medicine

## 2013-08-01 VITALS — BP 138/80 | HR 78 | Temp 96.1°F | Resp 16 | Ht 65.0 in | Wt 133.5 lb

## 2013-08-01 DIAGNOSIS — E1169 Type 2 diabetes mellitus with other specified complication: Secondary | ICD-10-CM | POA: Diagnosis not present

## 2013-08-01 DIAGNOSIS — E119 Type 2 diabetes mellitus without complications: Secondary | ICD-10-CM | POA: Diagnosis not present

## 2013-08-01 DIAGNOSIS — M810 Age-related osteoporosis without current pathological fracture: Secondary | ICD-10-CM

## 2013-08-01 DIAGNOSIS — N8110 Cystocele, unspecified: Secondary | ICD-10-CM

## 2013-08-01 DIAGNOSIS — E785 Hyperlipidemia, unspecified: Secondary | ICD-10-CM

## 2013-08-01 DIAGNOSIS — N8111 Cystocele, midline: Secondary | ICD-10-CM | POA: Diagnosis not present

## 2013-08-01 DIAGNOSIS — E1159 Type 2 diabetes mellitus with other circulatory complications: Secondary | ICD-10-CM

## 2013-08-01 DIAGNOSIS — I152 Hypertension secondary to endocrine disorders: Secondary | ICD-10-CM

## 2013-08-01 DIAGNOSIS — I10 Essential (primary) hypertension: Secondary | ICD-10-CM

## 2013-08-01 NOTE — Progress Notes (Signed)
Subjective:    Patient ID: Theresa Howe, female    DOB: [birth year], 79 y.o.   MRN: [id number]  HPI Pt here to establish care, previous PCP Dr. Welton Flakes in [place] Medications and history reviewed DM- approx 15-20 years, well controlled, had recent labs done, fasting CBG 80-100, followed by opthalmology has small cataract being watched Mass in vagina- feels like something is protruding for the past few months, has not had this evaluated, scheduled an appt with GYN for next week Osteoporosis- recently had Bone Density scan started on fosamax also taking Vit D  HTN- taking meds as prescribed, no difficulties   Husband died in 11/30/07, moved back to [place], her twin brother lives in [place] Retired worked in school system  Review of Systems  GEN- denies fatigue, fever, weight loss,weakness, recent illness HEENT- denies eye drainage, change in vision, nasal discharge, CVS- denies chest pain, palpitations RESP- denies SOB, cough, wheeze ABD- denies N/V, change in stools, abd pain GU- denies dysuria, hematuria, dribbling, incontinence MSK- denies joint pain, muscle aches, injury Neuro- denies headache, dizziness, syncope, seizure activity      Objective:   Physical Exam  GEN- NAD, alert and oriented x3 HEENT- PERRL, EOMI, non injected sclera, pink conjunctiva, MMM, oropharynx clear Neck- Supple,  CVS- RRR, no murmur RESP-CTAB ABD-NABS,SOFT,ND,NT EXT- No edema Pulses- Radial, DP- 2+ GU- normal external genitalia, mild vaginal atrophy , prolapse of bladder seen at introitus, no vaginal bleeding, mucuosa moist.       Assessment & Plan:

## 2013-08-01 NOTE — Patient Instructions (Addendum)
Please get release of records from [facility], [place]- include, labs, and colonoscopy report You will see specialist for bladder prolapse Continue current medications F/U 3 monthsProlapse  Prolapse means the falling down, bulging, dropping, or drooping of a body part. Organs that commonly prolapse include the rectum, small intestine, bladder, urethra, vagina (birth canal), uterus (womb), and cervix. Prolapse occurs when the ligaments and muscle tissue around the rectum, bladder, and uterus are damaged or weakened.  CAUSES  This happens especially with:  Childbirth. Some women feel pelvic pressure or have trouble holding their urine right after childbirth, because of stretching and tearing of pelvic tissues. This generally gets better with time and the feeling usually goes away, but it may return with aging.  Chronic heavy lifting.  Aging.  Menopause, with loss of estrogen production weakening the pelvic ligaments and muscles.  Past pelvic surgery.  Obesity.  Chronic constipation.  Chronic cough. Prolapse may affect a single organ, or several organs may prolapse at the same time. The front wall of the vagina holds up the bladder. The back wall holds up part of the lower intestine, or rectum. The uterus fills a spot in the middle. All these organs can be involved when the ligaments and muscles around the vagina relax too much. This often gets worse when women stop producing estrogen (menopause). SYMPTOMS  Uncontrolled loss of urine (incontinence) with cough, sneeze, straining, and exercise.  More force may be required to have a bowel movement, due to trapping of the stool.  When part of an organ bulges through the opening of the vagina, there is sometimes a feeling of heaviness or pressure. It may feel as though something is falling out. This sensation increases with coughing or bearing down.  If the organs protrude through the opening of the vagina and rub  against the clothing, there may be soreness, ulcers, infection, pain, and bleeding.  Lower back pain.  Pushing in the upper or lower part of the vagina, to pass urine or have a bowel movement.  Problems having sexual intercourse.  Being unable to insert a tampon or applicator. DIAGNOSIS  Usually, a physical exam is all that is needed to identify the problem. During the examination, you may be asked to cough and strain while lying down, sitting up, and standing up. Your caregiver will determine if more testing is required, such as bladder function tests. Some diagnoses are:  Cystocele: Bulging and falling of the bladder into the top of the vagina.  Rectocele: Part of the rectum bulging into the vagina.  Prolapse of the uterus: The uterus falls or drops into the vagina.  Enterocele: Bulging of the top of the vagina, after a hysterectomy (uterus removal), with the small intestine bulging into the vagina. A hernia in the top of the vagina.  Urethrocele: The urethra (urine carrying tube) bulging into the vagina. TREATMENT  In most cases, prolapse needs to be treated only if it produces symptoms. If the symptoms are interfering with your usual daily or sexual activities, treatment may be necessary. The following are some measures that may be used to treat prolapse.  Estrogen may help elderly women with mild prolapse.  Kegel exercises may help mild cases of prolapse, by strengthening and tightening the muscles of the pelvic floor.  Pessaries are used in women who choose not to, or are unable to, have surgery. A pessary is a doughnut-shaped piece of plastic or rubber that is put into the vagina to keep the organs in  place. This device must be fitted by your caregiver. Your caregiver will also explain how to care for yourself with the pessary. If it works well for you, this may be the only treatment required.  Surgery is often the only form of treatment for more severe prolapses. There are  different types of surgery available. You should discuss what the best procedure is for you. If the uterus is prolapsed, it may be removed (hysterectomy) as part of the surgical treatment. Your caregiver will discuss the risks and benefits with you.  Uterine-vaginal suspension (surgery to hold up the organs) may be used, especially if you want to maintain your fertility. No form of treatment is guaranteed to correct the prolapse or relieve the symptoms. HOME CARE INSTRUCTIONS   Wear a sanitary pad or absorbent product if you have incontinence of urine.  Avoid heavy lifting and straining with exercise and work.  Take over-the-counter pain medicine for minor discomfort.  Try taking estrogen or using estrogen vaginal cream.  Try Kegel exercises or use a pessary, before deciding to have surgery.  Do Kegel exercises after having a baby. SEEK MEDICAL CARE IF:   Your symptoms interfere with your daily activities.  You need medicine to help with the discomfort.  You need to be fitted with a pessary.  You notice bleeding from the vagina.  You think you have ulcers or you notice ulcers on the cervix.  You have an oral temperature above 102 F (38.9 C).  You develop pain or blood with urination.  You have bleeding with a bowel movement.  The symptoms are interfering with your sex life.  You have urinary incontinence that interferes with your daily activities.  You lose urine with sexual intercourse.  You have a chronic cough.  You have chronic constipation. Document Released: 06/10/2003 Document Revised: 02/26/2012 Document Reviewed: 12/19/2009 [facility] Patient Information 2014 [place], [place]. Prolapse  Prolapse means the falling down, bulging, dropping, or drooping of a body part. Organs that commonly prolapse include the rectum, small intestine, bladder, urethra, vagina (birth canal), uterus (womb), and cervix. Prolapse occurs when the ligaments and muscle tissue around the  rectum, bladder, and uterus are damaged or weakened.  CAUSES  This happens especially with:  Childbirth. Some women feel pelvic pressure or have trouble holding their urine right after childbirth, because of stretching and tearing of pelvic tissues. This generally gets better with time and the feeling usually goes away, but it may return with aging.  Chronic heavy lifting.  Aging.  Menopause, with loss of estrogen production weakening the pelvic ligaments and muscles.  Past pelvic surgery.  Obesity.  Chronic constipation.  Chronic cough. Prolapse may affect a single organ, or several organs may prolapse at the same time. The front wall of the vagina holds up the bladder. The back wall holds up part of the lower intestine, or rectum. The uterus fills a spot in the middle. All these organs can be involved when the ligaments and muscles around the vagina relax too much. This often gets worse when women stop producing estrogen (menopause). SYMPTOMS  Uncontrolled loss of urine (incontinence) with cough, sneeze, straining, and exercise.  More force may be required to have a bowel movement, due to trapping of the stool.  When part of an organ bulges through the opening of the vagina, there is sometimes a feeling of heaviness or pressure. It may feel as though something is falling out. This sensation increases with coughing or bearing down.  If the organs  protrude through the opening of the vagina and rub against the clothing, there may be soreness, ulcers, infection, pain, and bleeding.  Lower back pain.  Pushing in the upper or lower part of the vagina, to pass urine or have a bowel movement.  Problems having sexual intercourse.  Being unable to insert a tampon or applicator. DIAGNOSIS  Usually, a physical exam is all that is needed to identify the problem. During the examination, you may be asked to cough and strain while lying down, sitting up, and standing up. Your caregiver will  determine if more testing is required, such as bladder function tests. Some diagnoses are:  Cystocele: Bulging and falling of the bladder into the top of the vagina.  Rectocele: Part of the rectum bulging into the vagina.  Prolapse of the uterus: The uterus falls or drops into the vagina.  Enterocele: Bulging of the top of the vagina, after a hysterectomy (uterus removal), with the small intestine bulging into the vagina. A hernia in the top of the vagina.  Urethrocele: The urethra (urine carrying tube) bulging into the vagina. TREATMENT  In most cases, prolapse needs to be treated only if it produces symptoms. If the symptoms are interfering with your usual daily or sexual activities, treatment may be necessary. The following are some measures that may be used to treat prolapse.  Estrogen may help elderly women with mild prolapse.  Kegel exercises may help mild cases of prolapse, by strengthening and tightening the muscles of the pelvic floor.  Pessaries are used in women who choose not to, or are unable to, have surgery. A pessary is a doughnut-shaped piece of plastic or rubber that is put into the vagina to keep the organs in place. This device must be fitted by your caregiver. Your caregiver will also explain how to care for yourself with the pessary. If it works well for you, this may be the only treatment required.  Surgery is often the only form of treatment for more severe prolapses. There are different types of surgery available. You should discuss what the best procedure is for you. If the uterus is prolapsed, it may be removed (hysterectomy) as part of the surgical treatment. Your caregiver will discuss the risks and benefits with you.  Uterine-vaginal suspension (surgery to hold up the organs) may be used, especially if you want to maintain your fertility. No form of treatment is guaranteed to correct the prolapse or relieve the symptoms. HOME CARE INSTRUCTIONS   Wear a sanitary  pad or absorbent product if you have incontinence of urine.  Avoid heavy lifting and straining with exercise and work.  Take over-the-counter pain medicine for minor discomfort.  Try taking estrogen or using estrogen vaginal cream.  Try Kegel exercises or use a pessary, before deciding to have surgery.  Do Kegel exercises after having a baby. SEEK MEDICAL CARE IF:   Your symptoms interfere with your daily activities.  You need medicine to help with the discomfort.  You need to be fitted with a pessary.  You notice bleeding from the vagina.  You think you have ulcers or you notice ulcers on the cervix.  You have an oral temperature above 102 F (38.9 C).  You develop pain or blood with urination.  You have bleeding with a bowel movement.  The symptoms are interfering with your sex life.  You have urinary incontinence that interferes with your daily activities.  You lose urine with sexual intercourse.  You have a chronic cough.  You have chronic constipation. Document Released: 06/10/2003 Document Revised: 02/26/2012 Document Reviewed: 12/19/2009 [facility] Patient Information 2014 [place], [place].

## 2013-08-03 DIAGNOSIS — N8110 Cystocele, unspecified: Secondary | ICD-10-CM | POA: Insufficient documentation

## 2013-08-03 DIAGNOSIS — M810 Age-related osteoporosis without current pathological fracture: Secondary | ICD-10-CM | POA: Insufficient documentation

## 2013-08-03 NOTE — Assessment & Plan Note (Signed)
She is not symptomatic with leaking, with her vaginal/bladder prolapse I will see if the GYN she is scheduled with will treat her for this, if not needs referral to urology

## 2013-08-03 NOTE — Assessment & Plan Note (Signed)
Continue statin obtain records

## 2013-08-03 NOTE — Assessment & Plan Note (Signed)
Continues meds

## 2013-08-03 NOTE — Assessment & Plan Note (Signed)
Well controlled 

## 2013-08-03 NOTE — Assessment & Plan Note (Signed)
Obtain last set of labs and records appears to be well controlled on oral meds On ASA and ACEI

## 2013-08-04 ENCOUNTER — Telehealth: Payer: Self-pay | Admitting: Family Medicine

## 2013-08-04 NOTE — Telephone Encounter (Signed)
She has been seeing Dr. Billy Coast [phone]  She said that you were going to call him about something and you needed his number.

## 2013-08-04 NOTE — Telephone Encounter (Signed)
Please let pt know, I spoke with office. Dr. Billy Coast can treat the bladder prolapse, keep the appointment for now

## 2013-08-04 NOTE — Telephone Encounter (Signed)
Pt aware.

## 2013-08-06 DIAGNOSIS — N8111 Cystocele, midline: Secondary | ICD-10-CM | POA: Diagnosis not present

## 2013-08-12 DIAGNOSIS — N8111 Cystocele, midline: Secondary | ICD-10-CM | POA: Diagnosis not present

## 2013-08-15 DIAGNOSIS — H4011X0 Primary open-angle glaucoma, stage unspecified: Secondary | ICD-10-CM | POA: Diagnosis not present

## 2013-08-15 DIAGNOSIS — H2510 Age-related nuclear cataract, unspecified eye: Secondary | ICD-10-CM | POA: Diagnosis not present

## 2013-08-15 DIAGNOSIS — H538 Other visual disturbances: Secondary | ICD-10-CM | POA: Diagnosis not present

## 2013-10-06 ENCOUNTER — Ambulatory Visit (INDEPENDENT_AMBULATORY_CARE_PROVIDER_SITE_OTHER): Payer: Medicare Other | Admitting: Family Medicine

## 2013-10-06 ENCOUNTER — Encounter: Payer: Self-pay | Admitting: Family Medicine

## 2013-10-06 VITALS — BP 138/70 | HR 78 | Temp 97.9°F | Resp 19 | Wt 131.0 lb

## 2013-10-06 DIAGNOSIS — I152 Hypertension secondary to endocrine disorders: Secondary | ICD-10-CM

## 2013-10-06 DIAGNOSIS — E119 Type 2 diabetes mellitus without complications: Secondary | ICD-10-CM | POA: Diagnosis not present

## 2013-10-06 DIAGNOSIS — N8111 Cystocele, midline: Secondary | ICD-10-CM | POA: Diagnosis not present

## 2013-10-06 DIAGNOSIS — E785 Hyperlipidemia, unspecified: Secondary | ICD-10-CM

## 2013-10-06 DIAGNOSIS — F411 Generalized anxiety disorder: Secondary | ICD-10-CM

## 2013-10-06 DIAGNOSIS — E1169 Type 2 diabetes mellitus with other specified complication: Secondary | ICD-10-CM

## 2013-10-06 DIAGNOSIS — E1159 Type 2 diabetes mellitus with other circulatory complications: Secondary | ICD-10-CM

## 2013-10-06 DIAGNOSIS — I10 Essential (primary) hypertension: Secondary | ICD-10-CM

## 2013-10-06 DIAGNOSIS — N898 Other specified noninflammatory disorders of vagina: Secondary | ICD-10-CM | POA: Diagnosis not present

## 2013-10-06 DIAGNOSIS — Z23 Encounter for immunization: Secondary | ICD-10-CM | POA: Diagnosis not present

## 2013-10-06 DIAGNOSIS — N8110 Cystocele, unspecified: Secondary | ICD-10-CM

## 2013-10-06 LAB — CBC WITH DIFFERENTIAL/PLATELET
Basophils Relative: 0 % (ref 0–1)
Eosinophils Absolute: 0 10*3/uL (ref 0.0–0.7)
HCT: 35.5 % — ABNORMAL LOW (ref 36.0–46.0)
Hemoglobin: 11.7 g/dL — ABNORMAL LOW (ref 12.0–15.0)
Lymphs Abs: 1 10*3/uL (ref 0.7–4.0)
MCH: 28.1 pg (ref 26.0–34.0)
MCHC: 33 g/dL (ref 30.0–36.0)
Monocytes Absolute: 0.3 10*3/uL (ref 0.1–1.0)
Monocytes Relative: 9 % (ref 3–12)
RBC: 4.16 MIL/uL (ref 3.87–5.11)

## 2013-10-06 LAB — LIPID PANEL
HDL: 63 mg/dL (ref >39)
LDL Cholesterol: 84 mg/dL (ref 0–99)
Total CHOL/HDL Ratio: 2.6 Ratio
Triglycerides: 91 mg/dL (ref <150)
VLDL: 18 mg/dL (ref 0–40)

## 2013-10-06 LAB — COMPLETE METABOLIC PANEL WITH GFR
Albumin: 4.2 g/dL (ref 3.5–5.2)
Alkaline Phosphatase: 46 U/L (ref 39–117)
BUN: 8 mg/dL (ref 6–23)
CO2: 26 mEq/L (ref 19–32)
GFR, Est African American: 59 mL/min — ABNORMAL LOW
GFR, Est Non African American: 51 mL/min — ABNORMAL LOW
Glucose, Bld: 76 mg/dL (ref 70–99)
Potassium: 3.9 mEq/L (ref 3.5–5.3)
Sodium: 140 mEq/L (ref 135–145)
Total Bilirubin: 0.6 mg/dL (ref 0.3–1.2)
Total Protein: 6.7 g/dL (ref 6.0–8.3)

## 2013-10-06 LAB — MICROALBUMIN / CREATININE URINE RATIO: Creatinine, Urine: 145.6 mg/dL

## 2013-10-06 LAB — HEMOGLOBIN A1C: Hgb A1c MFr Bld: 6.5 % — ABNORMAL HIGH (ref <5.7)

## 2013-10-06 MED ORDER — ESCITALOPRAM OXALATE 5 MG PO TABS: 5.0000 mg | ORAL_TABLET | Freq: Every day | ORAL | Status: DC

## 2013-10-06 NOTE — Assessment & Plan Note (Signed)
I think this is more situational anxiety secondary to where she lives in her neighborhood however she is unable to move from the current place. She states that her nerves are bad therefore we will try her on Lexapro 5 mg and see how she does.

## 2013-10-06 NOTE — Assessment & Plan Note (Signed)
Check A1C, Bmet, CBC Urine Micro

## 2013-10-06 NOTE — Assessment & Plan Note (Signed)
Well controlled 

## 2013-10-06 NOTE — Progress Notes (Signed)
Subjective:    Patient ID: Theresa Howe, female    DOB: 08/15/1931, 79 y.o.   MRN: [id number]  HPI  Pt here for f/u. She is a few concerns She has her colonoscopy from 2013 she was looking at the pictures and became very anxious and worried. Colonoscopy was reviewed with her it showed internal hemorrhoids and a poor prep otherwise normal.  She was seen by GYN and had pessary placed secondary to bladder prolapse and incontinence. She noticed over the past week she's had some bleeding and a vaginal discharge which was present before. She denies any abdominal pain or dysuria.  Diabetes mellitus her blood sugars have been around 80 to 100s fasting she denies any hypoglycemia no polyuria no polydipsia .  Anxiety she states that her nerves are bad she was in a very bad neighborhood and they're always gains hanging around her home. She does not sleep well at night he needs something to help calm her nerves. She lives alone   Review of Systems  GEN- denies fatigue, fever, weight loss,weakness, recent illness HEENT- denies eye drainage, change in vision, nasal discharge, CVS- denies chest pain, palpitations RESP- denies SOB, cough, wheeze ABD- denies N/V, change in stools, abd pain GU- denies dysuria, hematuria, dribbling, incontinence MSK- denies joint pain, muscle aches, injury Neuro- denies headache, dizziness, syncope, seizure activity      Objective:   Physical Exam GEN- NAD, alert and oriented x3 HEENT- PERRL, EOMI, non injected sclera, pink conjunctiva, MMM, oropharynx clear CVS- RRR, no murmur RESP-CTAB abd-nabs,soft,NT,ND GU- normal external genitalia, vaginal mucosa atrophy bladder prolpase, greenish/yellow thin watery discharge, pessary on floor of vagina, no bleeding seen, absent cervix/uterus EXT- No edema Pulses- Radial, DP- 2+ Psych- normal affect and mood        Assessment & Plan:

## 2013-10-06 NOTE — Assessment & Plan Note (Signed)
Check FLP 

## 2013-10-06 NOTE — Assessment & Plan Note (Signed)
I did check her pessary area she does have a significant amount of discharge and it seems like the pessary may be out of place. I did call her GYN they will see her this afternoon therefore  I will discard my  samples and let them take over.

## 2013-10-06 NOTE — Patient Instructions (Addendum)
Start the lexapro once a day for nerves Continue all other medications Appt Dr. Billy Coast- 1:45pm today We will call with lab results Flu shot given F/U 4 weeks for your mood

## 2013-11-03 ENCOUNTER — Ambulatory Visit (INDEPENDENT_AMBULATORY_CARE_PROVIDER_SITE_OTHER): Payer: Medicare Other | Admitting: Family Medicine

## 2013-11-03 ENCOUNTER — Encounter: Payer: Self-pay | Admitting: Family Medicine

## 2013-11-03 VITALS — BP 110/60 | HR 78 | Temp 97.9°F | Resp 18 | Ht 63.0 in | Wt 132.0 lb

## 2013-11-03 DIAGNOSIS — Z23 Encounter for immunization: Secondary | ICD-10-CM

## 2013-11-03 DIAGNOSIS — F411 Generalized anxiety disorder: Secondary | ICD-10-CM | POA: Diagnosis not present

## 2013-11-03 NOTE — Patient Instructions (Signed)
Continue current medications F/U 3 months  

## 2013-11-03 NOTE — Progress Notes (Signed)
Subjective:    Patient ID: Theresa Howe, female    DOB: Mar 07, 1931, 79 y.o.   MRN: [id number]  HPI Patient here to followup anxiety. She was started on Lexapro 5 mg at the last visit. She thinks that this really helped keep her calm during the day and also helps her sleep well. She's not had any difficulties with the medication. Of note she was seen by her GYN at her last visit and her pessary was cleaned and replaced   Review of Systems - per above  GEN- denies fatigue, fever, weight loss,weakness, recent illness HEENT- denies eye drainage, change in vision, nasal discharge, CVS- denies chest pain, palpitations RESP- denies SOB, cough, wheeze Neuro- denies headache, dizziness, syncope, seizure activity       Objective:   Physical Exam  GEN-NAD,alert and oriented x 3 Psych- normal affect and mood, good eye contact, well groomed, no SI      Assessment & Plan:

## 2013-11-03 NOTE — Assessment & Plan Note (Signed)
Symptoms much improved Continue low dose of lexapro

## 2013-12-08 ENCOUNTER — Telehealth: Payer: Self-pay | Admitting: Family Medicine

## 2013-12-08 MED ORDER — METFORMIN HCL 1000 MG PO TABS: 1000.0000 mg | ORAL_TABLET | Freq: Two times a day (BID) | ORAL | Status: DC

## 2013-12-08 NOTE — Telephone Encounter (Signed)
Meds refilled.

## 2013-12-08 NOTE — Telephone Encounter (Signed)
Needs refill on Metformin HCL 1000mg  #180 sent in to [facility]

## 2013-12-23 DIAGNOSIS — H4089 Other specified glaucoma: Secondary | ICD-10-CM | POA: Diagnosis not present

## 2013-12-23 DIAGNOSIS — H2510 Age-related nuclear cataract, unspecified eye: Secondary | ICD-10-CM | POA: Diagnosis not present

## 2013-12-23 DIAGNOSIS — Z961 Presence of intraocular lens: Secondary | ICD-10-CM | POA: Diagnosis not present

## 2013-12-23 DIAGNOSIS — H538 Other visual disturbances: Secondary | ICD-10-CM | POA: Diagnosis not present

## 2013-12-24 DIAGNOSIS — N8111 Cystocele, midline: Secondary | ICD-10-CM | POA: Diagnosis not present

## 2014-01-27 ENCOUNTER — Other Ambulatory Visit: Payer: Self-pay | Admitting: Family Medicine

## 2014-02-04 ENCOUNTER — Ambulatory Visit: Payer: Medicare Other | Admitting: Family Medicine

## 2014-02-11 ENCOUNTER — Ambulatory Visit (INDEPENDENT_AMBULATORY_CARE_PROVIDER_SITE_OTHER): Payer: Medicare Other | Admitting: Family Medicine

## 2014-02-11 ENCOUNTER — Encounter: Payer: Self-pay | Admitting: Family Medicine

## 2014-02-11 VITALS — BP 142/76 | HR 76 | Temp 98.2°F | Resp 18 | Ht 64.0 in | Wt 133.0 lb

## 2014-02-11 DIAGNOSIS — I152 Hypertension secondary to endocrine disorders: Secondary | ICD-10-CM

## 2014-02-11 DIAGNOSIS — I10 Essential (primary) hypertension: Secondary | ICD-10-CM | POA: Diagnosis not present

## 2014-02-11 DIAGNOSIS — E785 Hyperlipidemia, unspecified: Secondary | ICD-10-CM

## 2014-02-11 DIAGNOSIS — E1169 Type 2 diabetes mellitus with other specified complication: Secondary | ICD-10-CM | POA: Diagnosis not present

## 2014-02-11 DIAGNOSIS — E1159 Type 2 diabetes mellitus with other circulatory complications: Secondary | ICD-10-CM

## 2014-02-11 DIAGNOSIS — E119 Type 2 diabetes mellitus without complications: Secondary | ICD-10-CM | POA: Diagnosis not present

## 2014-02-11 LAB — CBC WITH DIFFERENTIAL/PLATELET
BASOS PCT: 0 % (ref 0–1)
Basophils Absolute: 0 10*3/uL (ref 0.0–0.1)
EOS ABS: 0 10*3/uL (ref 0.0–0.7)
EOS PCT: 1 % (ref 0–5)
HCT: 36.2 % (ref 36.0–46.0)
Hemoglobin: 11.7 g/dL — ABNORMAL LOW (ref 12.0–15.0)
Lymphocytes Relative: 44 % (ref 12–46)
Lymphs Abs: 1.5 10*3/uL (ref 0.7–4.0)
MCH: 27.6 pg (ref 26.0–34.0)
MCHC: 32.3 g/dL (ref 30.0–36.0)
MCV: 85.4 fL (ref 78.0–100.0)
Monocytes Absolute: 0.3 10*3/uL (ref 0.1–1.0)
Monocytes Relative: 9 % (ref 3–12)
NEUTROS PCT: 46 % (ref 43–77)
Neutro Abs: 1.5 10*3/uL — ABNORMAL LOW (ref 1.7–7.7)
PLATELETS: 282 10*3/uL (ref 150–400)
RBC: 4.24 MIL/uL (ref 3.87–5.11)
RDW: 14.7 % (ref 11.5–15.5)
WBC: 3.3 10*3/uL — ABNORMAL LOW (ref 4.0–10.5)

## 2014-02-11 LAB — COMPREHENSIVE METABOLIC PANEL WITH GFR
ALT: 11 U/L (ref 0–35)
AST: 24 U/L (ref 0–37)
Albumin: 4.1 g/dL (ref 3.5–5.2)
Alkaline Phosphatase: 52 U/L (ref 39–117)
BUN: 10 mg/dL (ref 6–23)
CO2: 27 meq/L (ref 19–32)
Calcium: 9.6 mg/dL (ref 8.4–10.5)
Chloride: 103 meq/L (ref 96–112)
Creat: 0.98 mg/dL (ref 0.50–1.10)
Glucose, Bld: 74 mg/dL (ref 70–99)
Potassium: 3.9 meq/L (ref 3.5–5.3)
Sodium: 140 meq/L (ref 135–145)
Total Bilirubin: 0.6 mg/dL (ref 0.2–1.2)
Total Protein: 6.4 g/dL (ref 6.0–8.3)

## 2014-02-11 LAB — HEMOGLOBIN A1C
HEMOGLOBIN A1C: 6.1 % — AB (ref <5.7)
MEAN PLASMA GLUCOSE: 128 mg/dL — AB (ref <117)

## 2014-02-11 NOTE — Assessment & Plan Note (Addendum)
I will check A1C and renal function I may be able to stop the amaryl

## 2014-02-11 NOTE — Progress Notes (Signed)
Patient ID: Theresa Howe, female   DOB: 05-16-1931, 80 y.o.   MRN: [id number]   Subjective:    Patient ID: Theresa Howe, female    DOB: 1931/06/08, 80 y.o.   MRN: [id number]  Patient presents for F/U No specific concerns, she has some chest discomfort a few weeks ago, non radiating , thought it was due to stress as she as having difficulty with a family member, has resolved now.  DM- CBG fasting around100, 1 episode of hypoglycemia , no polyuria, polydipsida, taking meds as prescribed  Anxiety- doing well on lexapro, states the neighbor that was bothering her has moved   Review Of Systems:  GEN- denies fatigue, fever, weight loss,weakness, recent illness HEENT- denies eye drainage, change in vision, nasal discharge, CVS- denies chest pain, palpitations RESP- denies SOB, cough, wheeze ABD- denies N/V, change in stools, abd pain GU- denies dysuria, hematuria, dribbling, incontinence MSK- denies joint pain, muscle aches, injury Neuro- denies headache, dizziness, syncope, seizure activity       Objective:    BP 142/76  Pulse 76  Temp(Src) 98.2 F (36.8 C)  Resp 18  Ht 5\' 4"  (1.626 m)  Wt 133 lb (60.328 kg)  BMI 22.82 kg/m2 GEN- NAD, alert and oriented x3 HEENT- PERRL, EOMI, non injected sclera, pink conjunctiva, MMM, oropharynx clear CVS- RRR, no murmur RESP-CTAB EXT- No edema Pulses- Radial, DP- 2+         Assessment & Plan:      Problem List Items Addressed This Visit   Diabetes mellitus - Primary     I will check A1C and renal function    Relevant Orders      CBC with Differential      Comprehensive metabolic panel      Hemoglobin A1c      Note: This dictation was prepared with Dragon dictation along with smaller phrase technology. Any transcriptional errors that result from this process are unintentional.

## 2014-02-11 NOTE — Patient Instructions (Signed)
We will call with lab results Continue current medications F/U 4 months 

## 2014-02-13 NOTE — Assessment & Plan Note (Signed)
LDL at goal 4 months ago, continue zocor

## 2014-02-13 NOTE — Assessment & Plan Note (Signed)
Well controlled 

## 2014-02-16 ENCOUNTER — Encounter: Payer: Self-pay | Admitting: *Deleted

## 2014-02-25 ENCOUNTER — Other Ambulatory Visit: Payer: Self-pay | Admitting: Family Medicine

## 2014-02-25 DIAGNOSIS — F419 Anxiety disorder, unspecified: Secondary | ICD-10-CM

## 2014-02-26 NOTE — Telephone Encounter (Signed)
Refill appropriate and filled per protocol. 

## 2014-03-25 DIAGNOSIS — N8111 Cystocele, midline: Secondary | ICD-10-CM | POA: Diagnosis not present

## 2014-04-03 ENCOUNTER — Other Ambulatory Visit: Payer: Self-pay | Admitting: *Deleted

## 2014-04-03 MED ORDER — SIMVASTATIN 40 MG PO TABS: 40.0000 mg | ORAL_TABLET | Freq: Every evening | ORAL | Status: DC

## 2014-04-03 NOTE — Telephone Encounter (Signed)
Refill appropriate and filled per protocol. 

## 2014-04-21 DIAGNOSIS — Z961 Presence of intraocular lens: Secondary | ICD-10-CM | POA: Diagnosis not present

## 2014-04-21 DIAGNOSIS — H4089 Other specified glaucoma: Secondary | ICD-10-CM | POA: Diagnosis not present

## 2014-04-21 DIAGNOSIS — H538 Other visual disturbances: Secondary | ICD-10-CM | POA: Diagnosis not present

## 2014-05-05 DIAGNOSIS — L97309 Non-pressure chronic ulcer of unspecified ankle with unspecified severity: Secondary | ICD-10-CM | POA: Diagnosis not present

## 2014-05-05 DIAGNOSIS — M79609 Pain in unspecified limb: Secondary | ICD-10-CM | POA: Diagnosis not present

## 2014-05-12 ENCOUNTER — Other Ambulatory Visit: Payer: Self-pay | Admitting: *Deleted

## 2014-05-12 MED ORDER — GLIMEPIRIDE 2 MG PO TABS: 2.0000 mg | ORAL_TABLET | Freq: Every day | ORAL | Status: DC

## 2014-05-12 NOTE — Telephone Encounter (Signed)
Refill appropriate and filled per protocol. 

## 2014-05-26 DIAGNOSIS — M79609 Pain in unspecified limb: Secondary | ICD-10-CM | POA: Diagnosis not present

## 2014-05-26 DIAGNOSIS — M898X9 Other specified disorders of bone, unspecified site: Secondary | ICD-10-CM | POA: Diagnosis not present

## 2014-06-01 ENCOUNTER — Other Ambulatory Visit: Payer: Self-pay | Admitting: Family Medicine

## 2014-06-01 ENCOUNTER — Other Ambulatory Visit: Payer: Self-pay

## 2014-06-01 DIAGNOSIS — Z1231 Encounter for screening mammogram for malignant neoplasm of breast: Secondary | ICD-10-CM

## 2014-06-02 ENCOUNTER — Ambulatory Visit: Payer: Medicare Other

## 2014-06-03 ENCOUNTER — Ambulatory Visit
Admission: RE | Admit: 2014-06-03 | Discharge: 2014-06-03 | Disposition: A | Discharge status: Home / Self Care | Payer: Medicare Other | Source: Ambulatory Visit

## 2014-06-03 DIAGNOSIS — Z1231 Encounter for screening mammogram for malignant neoplasm of breast: Secondary | ICD-10-CM | POA: Diagnosis not present

## 2014-06-12 ENCOUNTER — Ambulatory Visit (INDEPENDENT_AMBULATORY_CARE_PROVIDER_SITE_OTHER): Payer: Medicare Other | Admitting: Family Medicine

## 2014-06-12 ENCOUNTER — Encounter: Payer: Self-pay | Admitting: Family Medicine

## 2014-06-12 VITALS — BP 132/68 | HR 64 | Temp 97.5°F | Resp 16 | Ht 64.0 in | Wt 131.0 lb

## 2014-06-12 DIAGNOSIS — I152 Hypertension secondary to endocrine disorders: Secondary | ICD-10-CM

## 2014-06-12 DIAGNOSIS — M810 Age-related osteoporosis without current pathological fracture: Secondary | ICD-10-CM

## 2014-06-12 DIAGNOSIS — E1159 Type 2 diabetes mellitus with other circulatory complications: Secondary | ICD-10-CM

## 2014-06-12 DIAGNOSIS — E119 Type 2 diabetes mellitus without complications: Secondary | ICD-10-CM | POA: Diagnosis not present

## 2014-06-12 DIAGNOSIS — M7918 Myalgia, other site: Secondary | ICD-10-CM

## 2014-06-12 DIAGNOSIS — E785 Hyperlipidemia, unspecified: Secondary | ICD-10-CM | POA: Diagnosis not present

## 2014-06-12 DIAGNOSIS — IMO0001 Reserved for inherently not codable concepts without codable children: Secondary | ICD-10-CM

## 2014-06-12 DIAGNOSIS — E1169 Type 2 diabetes mellitus with other specified complication: Secondary | ICD-10-CM | POA: Diagnosis not present

## 2014-06-12 DIAGNOSIS — I10 Essential (primary) hypertension: Secondary | ICD-10-CM

## 2014-06-12 LAB — COMPREHENSIVE METABOLIC PANEL
ALK PHOS: 48 U/L (ref 39–117)
ALT: 14 U/L (ref 0–35)
AST: 29 U/L (ref 0–37)
Albumin: 4.3 g/dL (ref 3.5–5.2)
BUN: 12 mg/dL (ref 6–23)
CO2: 23 mEq/L (ref 19–32)
Calcium: 8.9 mg/dL (ref 8.4–10.5)
Chloride: 100 mEq/L (ref 96–112)
Creat: 0.98 mg/dL (ref 0.50–1.10)
Glucose, Bld: 70 mg/dL (ref 70–99)
POTASSIUM: 3.5 meq/L (ref 3.5–5.3)
SODIUM: 137 meq/L (ref 135–145)
TOTAL PROTEIN: 6.9 g/dL (ref 6.0–8.3)
Total Bilirubin: 0.7 mg/dL (ref 0.2–1.2)

## 2014-06-12 LAB — CBC WITH DIFFERENTIAL/PLATELET
BASOS PCT: 0 % (ref 0–1)
Basophils Absolute: 0 10*3/uL (ref 0.0–0.1)
EOS ABS: 0 10*3/uL (ref 0.0–0.7)
Eosinophils Relative: 1 % (ref 0–5)
HCT: 35.5 % — ABNORMAL LOW (ref 36.0–46.0)
Hemoglobin: 11.5 g/dL — ABNORMAL LOW (ref 12.0–15.0)
LYMPHS ABS: 1.3 10*3/uL (ref 0.7–4.0)
Lymphocytes Relative: 40 % (ref 12–46)
MCH: 27.7 pg (ref 26.0–34.0)
MCHC: 32.4 g/dL (ref 30.0–36.0)
MCV: 85.5 fL (ref 78.0–100.0)
Monocytes Absolute: 0.3 10*3/uL (ref 0.1–1.0)
Monocytes Relative: 10 % (ref 3–12)
NEUTROS ABS: 1.6 10*3/uL — AB (ref 1.7–7.7)
NEUTROS PCT: 49 % (ref 43–77)
PLATELETS: 270 10*3/uL (ref 150–400)
RBC: 4.15 MIL/uL (ref 3.87–5.11)
RDW: 14.9 % (ref 11.5–15.5)
WBC: 3.3 10*3/uL — ABNORMAL LOW (ref 4.0–10.5)

## 2014-06-12 LAB — LIPID PANEL
CHOL/HDL RATIO: 2.4 ratio
Cholesterol: 173 mg/dL (ref 0–200)
HDL: 72 mg/dL (ref >39)
LDL CALC: 87 mg/dL (ref 0–99)
Triglycerides: 68 mg/dL (ref <150)
VLDL: 14 mg/dL (ref 0–40)

## 2014-06-12 LAB — HEMOGLOBIN A1C
Hgb A1c MFr Bld: 6.6 % — ABNORMAL HIGH (ref <5.7)
Mean Plasma Glucose: 143 mg/dL — ABNORMAL HIGH (ref <117)

## 2014-06-12 MED ORDER — FLUTICASONE PROPIONATE 50 MCG/ACT NA SUSP: 2.0000 | Freq: Every day | NASAL | Status: DC

## 2014-06-12 NOTE — Assessment & Plan Note (Signed)
Check A1c her last was 6.1% on Amaryl which she does tolerate fairly well

## 2014-06-12 NOTE — Assessment & Plan Note (Signed)
I will hold her cholesterol medication for a couple weeks to see if this is contributing to her muscle aches

## 2014-06-12 NOTE — Assessment & Plan Note (Signed)
Blood pressure looks good today no change in medication 

## 2014-06-12 NOTE — Assessment & Plan Note (Signed)
Hold her cholesterol medication see at the statins are causing her muscle aches,

## 2014-06-12 NOTE — Patient Instructions (Addendum)
Start the flonase  Stop the Paulina your cholesterol medication for a few weeks to see if it helps  We will call with lab results  Bone Density test to be done Release of records- Dr. Frederico Hamman- EYE DOCTOR F/U 3 months

## 2014-06-12 NOTE — Progress Notes (Signed)
Patient ID: Theresa Howe, female   DOB: 05-Jul-1931, 80 y.o.   MRN: [id number]   Subjective:    Patient ID: Theresa Howe, female    DOB: Jun 28, 1931, 80 y.o.   MRN: [id number]  Patient presents for 4 month F/U and Allergies  patient here to followup chronic medical problems. She does complain of some muscle pain on and off for the past couple months. She denies any recent falls. Denies any knee swelling. She's been taking her medications as prescribed she does not have her glucometer with her today but states that her sugars have been less than 100 fasting. No polyuria no polydipsia She is history of osteoporosis and is currently on the Xanax and is due for repeat bone density. She has also had some nasal drainage for the past couple months she did try some Coricidin as it makes her cough and no allergy medications   Review Of Systems:  GEN- denies fatigue, fever, weight loss,weakness, recent illness HEENT- denies eye drainage, change in vision, nasal discharge, CVS- denies chest pain, palpitations RESP- denies SOB, cough, wheeze ABD- denies N/V, change in stools, abd pain GU- denies dysuria, hematuria, dribbling, +incontinence MSK- +oint pain, muscle aches, injury Neuro- denies headache, dizziness, syncope, seizure activity       Objective:    BP 132/68  Pulse 64  Temp(Src) 97.5 F (36.4 C) (Oral)  Resp 16  Ht 5\' 4"  (1.626 m)  Wt 131 lb (59.421 kg)  BMI 22.47 kg/m2 GEN- NAD, alert and oriented x3 HEENT- PERRL, EOMI, non injected sclera, pink conjunctiva, MMM, oropharynx clear Neck- Supple, no LAD CVS- RRR, no murmur RESP-CTAB ABD-NABS,soft,NT,ND MSK- spine NT, good ROM back, knees-normal inspection, fair ROM EXT- No edema Pulses- Radial, DP- 2+        Assessment & Plan:      Problem List Items Addressed This Visit   Osteoporosis, unspecified   Relevant Orders      DG Bone Density   Hypertension associated with diabetes     Blood pressure looks good today no  change in medication    Hyperlipidemia LDL goal <70     I will hold her cholesterol medication for a couple weeks to see if this is contributing to her muscle aches    Relevant Orders      Lipid panel   Diabetes mellitus - Primary     Check A1c her last was 6.1% on Amaryl which she does tolerate fairly well    Relevant Orders      CBC with Differential      Comprehensive metabolic panel      Hemoglobin A1c      Note: This dictation was prepared with Dragon dictation along with smaller phrase technology. Any transcriptional errors that result from this process are unintentional.

## 2014-06-24 ENCOUNTER — Other Ambulatory Visit: Payer: Self-pay | Admitting: Family Medicine

## 2014-06-25 DIAGNOSIS — N8111 Cystocele, midline: Secondary | ICD-10-CM | POA: Diagnosis not present

## 2014-07-01 ENCOUNTER — Encounter (HOSPITAL_COMMUNITY): Payer: Self-pay | Admitting: Emergency Medicine

## 2014-07-01 ENCOUNTER — Emergency Department (INDEPENDENT_AMBULATORY_CARE_PROVIDER_SITE_OTHER): Payer: Medicare Other

## 2014-07-01 ENCOUNTER — Emergency Department (INDEPENDENT_AMBULATORY_CARE_PROVIDER_SITE_OTHER)
Admission: EM | Admit: 2014-07-01 | Discharge: 2014-07-01 | Disposition: A | Discharge status: Home / Self Care | Payer: Medicare Other | Source: Home / Self Care | Attending: Family Medicine | Admitting: Family Medicine

## 2014-07-01 DIAGNOSIS — J189 Pneumonia, unspecified organism: Secondary | ICD-10-CM

## 2014-07-01 DIAGNOSIS — J988 Other specified respiratory disorders: Secondary | ICD-10-CM | POA: Diagnosis not present

## 2014-07-01 MED ORDER — IPRATROPIUM-ALBUTEROL 0.5-2.5 (3) MG/3ML IN SOLN
RESPIRATORY_TRACT | Status: AC
  Filled 2014-07-01: qty 3

## 2014-07-01 MED ORDER — GUAIFENESIN-CODEINE 100-10 MG/5ML PO SOLN: 5.0000 mL | Freq: Every evening | ORAL | Status: DC | PRN

## 2014-07-01 MED ORDER — IPRATROPIUM-ALBUTEROL 0.5-2.5 (3) MG/3ML IN SOLN
3.0000 mL | Freq: Once | RESPIRATORY_TRACT | Status: AC
  Administered 2014-07-01: 3 mL via RESPIRATORY_TRACT

## 2014-07-01 MED ORDER — LEVOFLOXACIN 500 MG PO TABS: 500.0000 mg | ORAL_TABLET | Freq: Every day | ORAL | Status: DC

## 2014-07-01 MED ORDER — ALBUTEROL SULFATE HFA 108 (90 BASE) MCG/ACT IN AERS: 2.0000 | INHALATION_SPRAY | Freq: Four times a day (QID) | RESPIRATORY_TRACT | Status: DC | PRN

## 2014-07-01 NOTE — ED Notes (Signed)
C/o cough States she was seen by pcp two weeks ago States cough is productive with white clear mucous

## 2014-07-01 NOTE — Discharge Instructions (Signed)
Thank you for coming in today. Take Levaquin daily for 7 days Use albuterol inhaler and codeine containing cough medication as needed Check your blood sugar more frequently while taking Levaquin Followup with your primary care Dr. in about 2 weeks for recheck Call or go to the emergency room if you get worse, have trouble breathing, have chest pains, or palpitations.   Pneumonia, Adult Pneumonia is an infection of the lungs.  CAUSES Pneumonia may be caused by bacteria or a virus. Usually, these infections are caused by breathing infectious particles into the lungs (respiratory tract). SYMPTOMS   Cough.  Fever.  Chest pain.  Increased rate of breathing.  Wheezing.  Mucus production. DIAGNOSIS  If you have the common symptoms of pneumonia, your caregiver will typically confirm the diagnosis with a chest X-ray. The X-ray will show an abnormality in the lung (pulmonary infiltrate) if you have pneumonia. Other tests of your blood, urine, or sputum may be done to find the specific cause of your pneumonia. Your caregiver may also do tests (blood gases or pulse oximetry) to see how well your lungs are working. TREATMENT  Some forms of pneumonia may be spread to other people when you cough or sneeze. You may be asked to wear a mask before and during your exam. Pneumonia that is caused by bacteria is treated with antibiotic medicine. Pneumonia that is caused by the influenza virus may be treated with an antiviral medicine. Most other viral infections must run their course. These infections will not respond to antibiotics.  PREVENTION A pneumococcal shot (vaccine) is available to prevent a common bacterial cause of pneumonia. This is usually suggested for:  People over 28 years old.  Patients on chemotherapy.  People with chronic lung problems, such as bronchitis or emphysema.  People with immune system problems. If you are over 65 or have a high risk condition, you may receive the  pneumococcal vaccine if you have not received it before. In some countries, a routine influenza vaccine is also recommended. This vaccine can help prevent some cases of pneumonia.You may be offered the influenza vaccine as part of your care. If you smoke, it is time to quit. You may receive instructions on how to stop smoking. Your caregiver can provide medicines and counseling to help you quit. HOME CARE INSTRUCTIONS   Cough suppressants may be used if you are losing too much rest. However, coughing protects you by clearing your lungs. You should avoid using cough suppressants if you can.  Your caregiver may have prescribed medicine if he or she thinks your pneumonia is caused by a bacteria or influenza. Finish your medicine even if you start to feel better.  Your caregiver may also prescribe an expectorant. This loosens the mucus to be coughed up.  Only take over-the-counter or prescription medicines for pain, discomfort, or fever as directed by your caregiver.  Do not smoke. Smoking is a common cause of bronchitis and can contribute to pneumonia. If you are a smoker and continue to smoke, your cough may last several weeks after your pneumonia has cleared.  A cold steam vaporizer or humidifier in your room or home may help loosen mucus.  Coughing is often worse at night. Sleeping in a semi-upright position in a recliner or using a couple pillows under your head will help with this.  Get rest as you feel it is needed. Your body will usually let you know when you need to rest. SEEK IMMEDIATE MEDICAL CARE IF:   Your illness  becomes worse. This is especially true if you are elderly or weakened from any other disease.  You cannot control your cough with suppressants and are losing sleep.  You begin coughing up blood.  You develop pain which is getting worse or is uncontrolled with medicines.  You have a fever.  Any of the symptoms which initially brought you in for treatment are getting  worse rather than better.  You develop shortness of breath or chest pain. MAKE SURE YOU:   Understand these instructions.  Will watch your condition.  Will get help right away if you are not doing well or get worse. Document Released: 12/04/2005 Document Revised: 02/26/2012 Document Reviewed: 02/23/2011 [facility] Patient Information 2015 [place], [place]. This information is not intended to replace advice given to you by your health care provider. Make sure you discuss any questions you have with your health care provider.

## 2014-07-01 NOTE — ED Provider Notes (Signed)
Theresa Howe is a 80 y.o. female who presents to Urgent Care today for cough. Patient notes a productive cough that has been present for about 4 weeks. The cough is bothersome. She's tried Flonase nasal spray which did not help much. She denies any significant fevers or chills nausea vomiting or diarrhea. She notes her mother history of asthma in the 55s. She denies any chest pains or palpitations or significant shortness of breath.   Past Medical History  Diagnosis Date  . Hypertension   . Hyperlipidemia   . Glaucoma   . Diabetes mellitus   . Osteoporosis    History  Substance Use Topics  . Smoking status: Never Smoker   . Smokeless tobacco: Never Used  . Alcohol Use: No   ROS as above Medications: No current facility-administered medications for this encounter.   Current Outpatient Prescriptions  Medication Sig Dispense Refill  . albuterol (PROVENTIL HFA;VENTOLIN HFA) 108 (90 BASE) MCG/ACT inhaler Inhale 2 puffs into the lungs every 6 (six) hours as needed for wheezing or shortness of breath.  1 Inhaler  2  . alendronate (FOSAMAX) 70 MG tablet Take 70 mg by mouth every 7 (seven) days. Take with a full glass of water on an empty stomach.      Marland Kitchen aspirin 81 MG tablet Take 81 mg by mouth daily.        . Calcium Carbonate-Vitamin D (CALCIUM 600 + D PO) Take 1 tablet by mouth daily.       . dorzolamide (TRUSOPT) 2 % ophthalmic solution Place 1 drop into both eyes 2 (two) times daily.      Marland Kitchen escitalopram (LEXAPRO) 5 MG tablet TAKE 1 TABLET BY MOUTH EVERY DAY  30 tablet  3  . escitalopram (LEXAPRO) 5 MG tablet TAKE 1 TABLET BY MOUTH EVERY DAY  30 tablet  3  . fluticasone (FLONASE) 50 MCG/ACT nasal spray Place 2 sprays into both nostrils daily.  16 g  6  . glimepiride (AMARYL) 2 MG tablet Take 1 tablet (2 mg total) by mouth daily.  30 tablet  3  . guaiFENesin-codeine 100-10 MG/5ML syrup Take 5 mLs by mouth at bedtime as needed for cough.  120 mL  0  . latanoprost (XALATAN) 0.005 %  ophthalmic solution Place 1 drop into both eyes at bedtime.      Marland Kitchen levofloxacin (LEVAQUIN) 500 MG tablet Take 1 tablet (500 mg total) by mouth daily.  7 tablet  0  . metFORMIN (GLUCOPHAGE) 1000 MG tablet Take 1 tablet (1,000 mg total) by mouth 2 (two) times daily with a meal.  180 tablet  2  . Multiple Vitamins-Minerals (CENTRUM SILVER PO) Take 1 tablet by mouth daily.       . simvastatin (ZOCOR) 40 MG tablet Take 1 tablet (40 mg total) by mouth every evening.  30 tablet  6  . valsartan-hydrochlorothiazide (DIOVAN-HCT) 160-12.5 MG per tablet TAKE 1 TABLET BY MOUTH EVERY DAY  30 tablet  11    Exam:  BP 138/81  Pulse 86  Temp(Src) 98.1 F (36.7 C) (Oral)  Resp 16  SpO2 100% Gen: Well NAD HEENT: EOMI,  MMM posterior pharynx with cobblestoning Lungs: Normal work of breathing. CTABL Heart: RRR no MRG Abd: NABS, Soft. NT, ND Exts: Brisk capillary refill, warm and well perfused.   Patient was given a DuoNeb nebulizer treatment, and felt a little better  No results found for this or any previous visit (from the past 24 hour(s)). Dg Chest 2 View  07/01/2014   CLINICAL DATA:  Persistent cough for 1 month.  History of diabetes.  EXAM: CHEST  2 VIEW  COMPARISON:  12/18/2008 [facility].  FINDINGS: The heart size and mediastinal contours are stable with aortic atherosclerosis. There is new patchy left lower lobe airspace disease. The right lung is clear. There is no significant pleural effusion. Thoracolumbar scoliosis appears stable.  IMPRESSION: New patchy left lower lobe airspace disease suspicious for pneumonia, possibly partially treated. Radiographic follow up recommended to document clearing.   Electronically Signed   By: Camie Patience M.D.   On: 07/01/2014 10:36    Assessment and Plan: 80 y.o. female with CAP. Her symptoms may be due to old history of asthma. Symptoms present for around 1 month.  Plan to treat with Levaquin and albuterol. Use codeine cough medicine as needed.   F/u with PCP for repeat xray. If not better consider prednisone at that time.   Discussed warning signs or symptoms. Please see discharge instructions. Patient expresses understanding.    Gregor Hams, MD 07/01/14 1120

## 2014-07-03 ENCOUNTER — Ambulatory Visit (INDEPENDENT_AMBULATORY_CARE_PROVIDER_SITE_OTHER): Payer: 59 | Admitting: Family Medicine

## 2014-07-03 ENCOUNTER — Encounter: Payer: Self-pay | Admitting: Family Medicine

## 2014-07-03 VITALS — BP 128/62 | HR 68 | Temp 98.2°F | Resp 12 | Ht 63.5 in | Wt 132.0 lb

## 2014-07-03 DIAGNOSIS — J189 Pneumonia, unspecified organism: Secondary | ICD-10-CM

## 2014-07-03 NOTE — Progress Notes (Signed)
Patient ID: Theresa Howe, female   DOB: 10/17/1931, 80 y.o.   MRN: [id number]   Subjective:    Patient ID: Theresa Howe, female    DOB: 01-23-1931, 80 y.o.   MRN: [id number]  Patient presents for F/U Urgent Care- dx: pneumonia  patient here for interim followup on pneumonia. She was seen at the urgent care on the 15th at that time she had a few days of cough left side pain no fever. Chest x-ray showed concern for left infiltrate. She was started on Levaquin as well as Robitussin with codeine and given azithromycin and she did have some wheezing. She's taken her medications as prescribed and is improving. She has no new concerns today.    Review Of Systems:  GEN- + fatigue, fever, weight loss,weakness, recent illness HEENT- denies eye drainage, change in vision, nasal discharge, CVS- denies chest pain, palpitations RESP- denies SOB, +cough, wheeze ABD- denies N/V, change in stools, abd pain GU- denies dysuria, hematuria, dribbling, incontinence MSK- denies joint pain, muscle aches, injury Neuro- denies headache, dizziness, syncope, seizure activity       Objective:    BP 128/62  Pulse 68  Temp(Src) 98.2 F (36.8 C) (Oral)  Resp 12  Ht 5' 3.5" (1.613 m)  Wt 132 lb (59.875 kg)  BMI 23.01 kg/m2  SpO2 98% GEN- NAD, alert and oriented x3 HEENT- PERRL, EOMI, non injected sclera, pink conjunctiva, MMM, oropharynx clear Neck- Supple, no lymphadenopathy CVS- RRR, no murmur RESP-few crackles left base, no wheezing normal work of breathing good air movement EXT- No edema Pulses- Radial 2+        Assessment & Plan:      Problem List Items Addressed This Visit   None    Visit Diagnoses   CAP (community acquired pneumonia)    -  Primary    Her oxygen saturation is normal her exam looks okay today she's stable to be treated as an outpatient. Continue antibiotics recheck chest x-ray in 4 weeks       Note: This dictation was prepared with Dragon dictation along with smaller  phrase technology. Any transcriptional errors that result from this process are unintentional.

## 2014-07-03 NOTE — Patient Instructions (Signed)
Xray in 4 weeks Complete the antibiotics and cough syrup  Bone Density to be scheduled F/U as previous in 3 months

## 2014-07-03 NOTE — Addendum Note (Signed)
Addended by: Vic Blackbird F on: 07/03/2014 01:58 PM   Modules accepted: Orders

## 2014-07-06 ENCOUNTER — Telehealth: Payer: Self-pay | Admitting: *Deleted

## 2014-07-06 ENCOUNTER — Encounter: Payer: Self-pay | Admitting: *Deleted

## 2014-07-06 NOTE — Telephone Encounter (Signed)
Per Dr. Buelah Manis, check on scheduling bone density for pt, I have tried calling numerous times to contact patient, I sent out a letter today to pt stating needing to contact [facility]iology to make appointment for Bone Density test.

## 2014-07-07 ENCOUNTER — Other Ambulatory Visit: Payer: Self-pay | Admitting: Family Medicine

## 2014-07-08 ENCOUNTER — Ambulatory Visit (HOSPITAL_COMMUNITY)
Admission: RE | Admit: 2014-07-08 | Discharge: 2014-07-08 | Disposition: A | Discharge status: Home / Self Care | Payer: Medicare Other | Source: Ambulatory Visit | Attending: Family Medicine | Admitting: Family Medicine

## 2014-07-08 ENCOUNTER — Encounter: Payer: Self-pay | Admitting: *Deleted

## 2014-07-08 DIAGNOSIS — J189 Pneumonia, unspecified organism: Secondary | ICD-10-CM | POA: Diagnosis not present

## 2014-07-10 ENCOUNTER — Ambulatory Visit (HOSPITAL_COMMUNITY)
Admission: RE | Admit: 2014-07-10 | Discharge: 2014-07-10 | Disposition: A | Discharge status: Home / Self Care | Payer: Medicare Other | Source: Ambulatory Visit | Attending: Family Medicine | Admitting: Family Medicine

## 2014-07-10 DIAGNOSIS — M949 Disorder of cartilage, unspecified: Secondary | ICD-10-CM | POA: Diagnosis not present

## 2014-07-10 DIAGNOSIS — M899 Disorder of bone, unspecified: Secondary | ICD-10-CM | POA: Diagnosis not present

## 2014-07-10 DIAGNOSIS — Z780 Asymptomatic menopausal state: Secondary | ICD-10-CM | POA: Diagnosis not present

## 2014-07-10 DIAGNOSIS — M810 Age-related osteoporosis without current pathological fracture: Secondary | ICD-10-CM | POA: Insufficient documentation

## 2014-07-17 ENCOUNTER — Inpatient Hospital Stay: Payer: Medicare Other | Admitting: Family Medicine

## 2014-08-10 ENCOUNTER — Telehealth: Payer: Self-pay | Admitting: *Deleted

## 2014-08-10 MED ORDER — ALBUTEROL SULFATE HFA 108 (90 BASE) MCG/ACT IN AERS: 2.0000 | INHALATION_SPRAY | Freq: Four times a day (QID) | RESPIRATORY_TRACT | Status: DC | PRN

## 2014-08-10 MED ORDER — HYDROCODONE-HOMATROPINE 5-1.5 MG/5ML PO SYRP: 5.0000 mL | ORAL_SOLUTION | Freq: Four times a day (QID) | ORAL | Status: DC | PRN

## 2014-08-10 NOTE — Telephone Encounter (Signed)
Ok with proair and hycodan 1 tsp poq6 hrs prn 4 oz.

## 2014-08-10 NOTE — Telephone Encounter (Signed)
Prescription sent to pharmacy for [place].   Prescription printed for cough syrup and patient made aware to come to office to pick up.

## 2014-08-10 NOTE — Telephone Encounter (Signed)
Received call from patient.   Reports that she was seen in July 2015 for pneumonia and states that she continues to have cough.   Requested refill on ProAir inhaler and cough syrup with codeine.   Ok to refill?

## 2014-08-11 ENCOUNTER — Other Ambulatory Visit: Payer: Self-pay | Admitting: *Deleted

## 2014-08-11 MED ORDER — VALSARTAN-HYDROCHLOROTHIAZIDE 160-12.5 MG PO TABS: ORAL_TABLET | ORAL | Status: DC

## 2014-08-11 NOTE — Telephone Encounter (Signed)
Received fax requesting refill on [place].   Refill appropriate and filled per protocol.

## 2014-08-27 ENCOUNTER — Other Ambulatory Visit: Payer: Self-pay | Admitting: Family Medicine

## 2014-08-29 NOTE — Telephone Encounter (Signed)
Refill appropriate and filled per protocol. 

## 2014-08-30 ENCOUNTER — Other Ambulatory Visit: Payer: Self-pay | Admitting: Family Medicine

## 2014-09-14 ENCOUNTER — Ambulatory Visit: Payer: Medicare Other | Admitting: Family Medicine

## 2014-09-21 ENCOUNTER — Ambulatory Visit (INDEPENDENT_AMBULATORY_CARE_PROVIDER_SITE_OTHER): Payer: 59 | Admitting: Family Medicine

## 2014-09-21 ENCOUNTER — Encounter: Payer: Self-pay | Admitting: Family Medicine

## 2014-09-21 VITALS — BP 126/60 | HR 78 | Temp 97.6°F | Resp 14 | Ht 64.0 in | Wt 132.0 lb

## 2014-09-21 DIAGNOSIS — Z23 Encounter for immunization: Secondary | ICD-10-CM

## 2014-09-21 DIAGNOSIS — E1159 Type 2 diabetes mellitus with other circulatory complications: Secondary | ICD-10-CM

## 2014-09-21 DIAGNOSIS — I10 Essential (primary) hypertension: Secondary | ICD-10-CM

## 2014-09-21 DIAGNOSIS — M791 Myalgia: Secondary | ICD-10-CM

## 2014-09-21 DIAGNOSIS — E119 Type 2 diabetes mellitus without complications: Secondary | ICD-10-CM | POA: Diagnosis not present

## 2014-09-21 DIAGNOSIS — I152 Hypertension secondary to endocrine disorders: Secondary | ICD-10-CM

## 2014-09-21 DIAGNOSIS — M25559 Pain in unspecified hip: Secondary | ICD-10-CM | POA: Insufficient documentation

## 2014-09-21 DIAGNOSIS — E1169 Type 2 diabetes mellitus with other specified complication: Secondary | ICD-10-CM

## 2014-09-21 DIAGNOSIS — E785 Hyperlipidemia, unspecified: Secondary | ICD-10-CM | POA: Diagnosis not present

## 2014-09-21 DIAGNOSIS — M7918 Myalgia, other site: Secondary | ICD-10-CM

## 2014-09-21 DIAGNOSIS — M25552 Pain in left hip: Secondary | ICD-10-CM

## 2014-09-21 LAB — CBC WITH DIFFERENTIAL/PLATELET
Basophils Absolute: 0 10*3/uL (ref 0.0–0.1)
Basophils Relative: 1 % (ref 0–1)
EOS PCT: 2 % (ref 0–5)
Eosinophils Absolute: 0.1 10*3/uL (ref 0.0–0.7)
HCT: 34.4 % — ABNORMAL LOW (ref 36.0–46.0)
Hemoglobin: 11.6 g/dL — ABNORMAL LOW (ref 12.0–15.0)
LYMPHS PCT: 29 % (ref 12–46)
Lymphs Abs: 1.2 10*3/uL (ref 0.7–4.0)
MCH: 28.2 pg (ref 26.0–34.0)
MCHC: 33.7 g/dL (ref 30.0–36.0)
MCV: 83.7 fL (ref 78.0–100.0)
Monocytes Absolute: 0.3 10*3/uL (ref 0.1–1.0)
Monocytes Relative: 7 % (ref 3–12)
NEUTROS ABS: 2.4 10*3/uL (ref 1.7–7.7)
NEUTROS PCT: 61 % (ref 43–77)
Platelets: 273 10*3/uL (ref 150–400)
RBC: 4.11 MIL/uL (ref 3.87–5.11)
RDW: 15 % (ref 11.5–15.5)
WBC: 4 10*3/uL (ref 4.0–10.5)

## 2014-09-21 LAB — COMPLETE METABOLIC PANEL WITH GFR
ALBUMIN: 4.1 g/dL (ref 3.5–5.2)
ALT: 13 U/L (ref 0–35)
AST: 24 U/L (ref 0–37)
Alkaline Phosphatase: 55 U/L (ref 39–117)
BUN: 10 mg/dL (ref 6–23)
CO2: 26 mEq/L (ref 19–32)
Calcium: 9.5 mg/dL (ref 8.4–10.5)
Chloride: 100 mEq/L (ref 96–112)
Creat: 0.98 mg/dL (ref 0.50–1.10)
GFR, Est African American: 63 mL/min
GFR, Est Non African American: 54 mL/min — ABNORMAL LOW
Glucose, Bld: 80 mg/dL (ref 70–99)
Potassium: 3.7 mEq/L (ref 3.5–5.3)
Sodium: 135 mEq/L (ref 135–145)
Total Bilirubin: 0.5 mg/dL (ref 0.2–1.2)
Total Protein: 6.5 g/dL (ref 6.0–8.3)

## 2014-09-21 LAB — HEMOGLOBIN A1C
HEMOGLOBIN A1C: 6.5 % — AB (ref <5.7)
Mean Plasma Glucose: 140 mg/dL — ABNORMAL HIGH (ref <117)

## 2014-09-21 LAB — LIPID PANEL
Cholesterol: 252 mg/dL — ABNORMAL HIGH (ref 0–200)
HDL: 63 mg/dL (ref >39)
LDL Cholesterol: 166 mg/dL — ABNORMAL HIGH (ref 0–99)
TRIGLYCERIDES: 116 mg/dL (ref <150)
Total CHOL/HDL Ratio: 4 Ratio
VLDL: 23 mg/dL (ref 0–40)

## 2014-09-21 NOTE — Progress Notes (Signed)
Patient ID: Theresa Howe, female   DOB: [birth year], 80 y.o.   MRN: [id number]   Subjective:    Patient ID: Theresa Howe, female    DOB: July 13, 1931, 80 y.o.   MRN: [id number]  Patient presents for 3 month F/U and Insomnia  Patient here for followup on chronic medical problems. She does complain of left leg pain in airborne pain on and off. She tries to stay very active but sometimes it flares up on her. At her last visit she did complain of some leg pain was stopped her back and drug however she did not notice a difference with the episodes.  She's also had some difficulty sleeping this has occurred since she's had the pneumonia this summer where she coughs so much at night but now she wakes up during the night. She does not want a prescription drug to help with this. Her cough is actually resolved with no she could stop taking the cough medicine.  Diabetes mellitus she's currently on metformin and Amaryl her last A1c was 6.6% her fasting blood sugars around 98 to low 100s. Denies any hypoglycemia symptoms   Review Of Systems:  GEN- denies fatigue, fever, weight loss,weakness, recent illness HEENT- denies eye drainage, change in vision, nasal discharge, CVS- denies chest pain, palpitations RESP- denies SOB, cough, wheeze ABD- denies N/V, change in stools, abd pain GU- denies dysuria, hematuria, dribbling, incontinence MSK- denies + joint pain  denies headache, dizziness, syncope, seizure activity       Objective:    BP 126/60  Pulse 78  Temp(Src) 97.6 F (36.4 C) (Oral)  Resp 14  Ht 5\' 4"  (1.626 m)  Wt 132 lb (59.875 kg)  BMI 22.65 kg/m2 GEN- NAD, alert and oriented x3 HEENT- PERRL, EOMI, non injected sclera, pink conjunctiva, MMM, oropharynx clear Neck- Supple, no LAD CVS- RRR, no murmur RESP-CTAB ABD-NABS,soft,NT,ND MSK- Bilat Hip- neg hip rock, fair ROM, no click noted, no pain with IR/ER, knees-normal inspection, fair ROM EXT- No edema Pulses- Radial, DP- 2+          Assessment & Plan:      Problem List Items Addressed This Visit   Hypertension associated with diabetes   Hyperlipidemia LDL goal <70   Relevant Orders      Lipid panel (Completed)   Hip pain   Diabetes mellitus   Relevant Orders      COMPLETE METABOLIC PANEL WITH GFR (Completed)      CBC with Differential (Completed)      Hemoglobin A1c (Completed)    Other Visit Diagnoses   Need for prophylactic vaccination and inoculation against influenza    -  Primary       Note: This dictation was prepared with Dragon dictation along with smaller phrase technology. Any transcriptional errors that result from this process are unintentional.

## 2014-09-21 NOTE — Assessment & Plan Note (Signed)
Blood pressure well controlled

## 2014-09-21 NOTE — Patient Instructions (Signed)
Melatonin for sleep- over the counter Flu shot given Use tylenol may as needed for pain We will call with lab results F/U 4 months

## 2014-09-21 NOTE — Assessment & Plan Note (Signed)
Recheck A1c we'll continue metformin and glimepiride

## 2014-09-21 NOTE — Assessment & Plan Note (Signed)
This may be some referred pain from her hip. I will have her restart her cholesterol medication and she is diabetic and this will provide protection against coronary artery disease

## 2014-09-21 NOTE — Assessment & Plan Note (Signed)
I think that this is muscle skeletal and she probably has some underlying osteoarthritis. She's actually fairly active. We will just use Tylenol as needed she does not want any specific intervention at that time I think this is very reasonable based on the examination

## 2014-09-25 ENCOUNTER — Other Ambulatory Visit: Payer: Self-pay | Admitting: Family Medicine

## 2014-09-25 NOTE — Telephone Encounter (Signed)
Medication refilled per protocol. 

## 2014-09-28 DIAGNOSIS — N8111 Cystocele, midline: Secondary | ICD-10-CM | POA: Diagnosis not present

## 2014-10-20 DIAGNOSIS — H4050X0 Glaucoma secondary to other eye disorders, unspecified eye, stage unspecified: Secondary | ICD-10-CM | POA: Diagnosis not present

## 2014-10-20 DIAGNOSIS — H2512 Age-related nuclear cataract, left eye: Secondary | ICD-10-CM | POA: Diagnosis not present

## 2014-10-20 DIAGNOSIS — Z961 Presence of intraocular lens: Secondary | ICD-10-CM | POA: Diagnosis not present

## 2014-10-20 DIAGNOSIS — H538 Other visual disturbances: Secondary | ICD-10-CM | POA: Diagnosis not present

## 2014-11-09 ENCOUNTER — Encounter: Payer: Self-pay | Admitting: Family Medicine

## 2014-11-09 ENCOUNTER — Ambulatory Visit (INDEPENDENT_AMBULATORY_CARE_PROVIDER_SITE_OTHER): Payer: Medicare Other | Admitting: Family Medicine

## 2014-11-09 VITALS — BP 148/76 | HR 88 | Temp 98.7°F | Resp 18 | Wt 136.0 lb

## 2014-11-09 DIAGNOSIS — J209 Acute bronchitis, unspecified: Secondary | ICD-10-CM

## 2014-11-09 MED ORDER — ALBUTEROL SULFATE HFA 108 (90 BASE) MCG/ACT IN AERS: 2.0000 | INHALATION_SPRAY | Freq: Four times a day (QID) | RESPIRATORY_TRACT | Status: DC | PRN

## 2014-11-09 MED ORDER — AZITHROMYCIN 250 MG PO TABS: ORAL_TABLET | ORAL | Status: DC

## 2014-11-09 MED ORDER — PREDNISONE 20 MG PO TABS: 40.0000 mg | ORAL_TABLET | Freq: Every day | ORAL | Status: DC

## 2014-11-09 MED ORDER — HYDROCODONE-HOMATROPINE 5-1.5 MG/5ML PO SYRP: 5.0000 mL | ORAL_SOLUTION | Freq: Four times a day (QID) | ORAL | Status: DC | PRN

## 2014-11-09 NOTE — Progress Notes (Signed)
Patient ID: Theresa Howe, female   DOB: [birth year], 80 y.o.   MRN: [id number]   Subjective:    Patient ID: Theresa Howe, female    DOB: Apr 15, 1931, 80 y.o.   MRN: [id number]  Patient presents for Cough patient here with cough with production wheezing worsening over the past week. She's not had any fever. She feels tight in her chest. She does not have her albuterol inhaler. She does have remote history of asthma and states it feels like when she had asthma in the past. Of note she was treated for pneumonia a couple months ago.    Review Of Systems:  GEN- denies fatigue, fever, weight loss,weakness, recent illness HEENT- denies eye drainage, change in vision, nasal discharge, CVS- denies chest pain, palpitations RESP- +SOB, +cough, +wheeze ABD- denies N/V, change in stools, abd pain Neuro- denies headache, dizziness, syncope, seizure activity       Objective:    BP 148/76 mmHg  Pulse 88  Temp(Src) 98.7 F (37.1 C) (Oral)  Resp 18  Wt 136 lb (61.689 kg)  SpO2 98% GEN- NAD, alert and oriented x3 HEENT- PERRL, EOMI, non injected sclera, pink conjunctiva, MMM, oropharynx mild injection Neck- Supple, no LAD CVS- RRR, no murmur RESP-mild rhonchi bilat, no wheeze, normal WOB, harsh cough EXT- No edema Pulses- Radial, DP- 2+        Assessment & Plan:      Problem List Items Addressed This Visit    None    Visit Diagnoses    Acute bronchitis, unspecified organism    -  Primary    cover with zpak, recent pneumonia, steroid burst, albuterol, cough medication, no improvement CXR, stable for outpatient treatment       Note: This dictation was prepared with Dragon dictation along with smaller phrase technology. Any transcriptional errors that result from this process are unintentional.

## 2014-11-09 NOTE — Patient Instructions (Signed)
Take antibiotics as prescribed Take the prednisone, use the albuterol inhaler as needed Call if not getting better F/u -as previous

## 2014-11-21 ENCOUNTER — Other Ambulatory Visit: Payer: Self-pay | Admitting: Family Medicine

## 2014-11-23 ENCOUNTER — Telehealth: Payer: Self-pay | Admitting: Family Medicine

## 2014-11-23 MED ORDER — GLUCOSE BLOOD VI STRP: ORAL_STRIP | Status: DC

## 2014-11-23 NOTE — Telephone Encounter (Signed)
Prescription sent to pharmacy.

## 2014-11-23 NOTE — Telephone Encounter (Signed)
[phone]  Pt is needing a refill on her test strips (she uses one touch meter)  [facility]

## 2014-11-24 NOTE — Telephone Encounter (Signed)
Refill appropriate and filled per protocol. 

## 2014-12-23 DIAGNOSIS — N8111 Cystocele, midline: Secondary | ICD-10-CM | POA: Diagnosis not present

## 2014-12-29 ENCOUNTER — Other Ambulatory Visit: Payer: Self-pay | Admitting: Family Medicine

## 2014-12-29 NOTE — Telephone Encounter (Signed)
Refill appropriate and filled per protocol. 

## 2015-01-22 ENCOUNTER — Ambulatory Visit (INDEPENDENT_AMBULATORY_CARE_PROVIDER_SITE_OTHER): Payer: Medicare Other | Admitting: Family Medicine

## 2015-01-22 ENCOUNTER — Encounter: Payer: Self-pay | Admitting: Family Medicine

## 2015-01-22 VITALS — BP 128/74 | HR 68 | Temp 98.1°F | Resp 16 | Ht 64.0 in | Wt 136.0 lb

## 2015-01-22 DIAGNOSIS — E1159 Type 2 diabetes mellitus with other circulatory complications: Secondary | ICD-10-CM

## 2015-01-22 DIAGNOSIS — E119 Type 2 diabetes mellitus without complications: Secondary | ICD-10-CM | POA: Diagnosis not present

## 2015-01-22 DIAGNOSIS — I10 Essential (primary) hypertension: Secondary | ICD-10-CM

## 2015-01-22 DIAGNOSIS — I152 Hypertension secondary to endocrine disorders: Secondary | ICD-10-CM

## 2015-01-22 DIAGNOSIS — E785 Hyperlipidemia, unspecified: Secondary | ICD-10-CM

## 2015-01-22 DIAGNOSIS — F411 Generalized anxiety disorder: Secondary | ICD-10-CM

## 2015-01-22 DIAGNOSIS — E1169 Type 2 diabetes mellitus with other specified complication: Secondary | ICD-10-CM

## 2015-01-22 LAB — CBC WITH DIFFERENTIAL/PLATELET
Basophils Absolute: 0.1 10*3/uL (ref 0.0–0.1)
Basophils Relative: 2 % — ABNORMAL HIGH (ref 0–1)
Eosinophils Absolute: 0.6 10*3/uL (ref 0.0–0.7)
Eosinophils Relative: 18 % — ABNORMAL HIGH (ref 0–5)
HEMATOCRIT: 37.1 % (ref 36.0–46.0)
Hemoglobin: 11.8 g/dL — ABNORMAL LOW (ref 12.0–15.0)
LYMPHS PCT: 31 % (ref 12–46)
Lymphs Abs: 1 10*3/uL (ref 0.7–4.0)
MCH: 28.5 pg (ref 26.0–34.0)
MCHC: 31.8 g/dL (ref 30.0–36.0)
MCV: 89.6 fL (ref 78.0–100.0)
MONOS PCT: 7 % (ref 3–12)
MPV: 10 fL (ref 8.6–12.4)
Monocytes Absolute: 0.2 10*3/uL (ref 0.1–1.0)
NEUTROS PCT: 42 % — AB (ref 43–77)
Neutro Abs: 1.4 10*3/uL — ABNORMAL LOW (ref 1.7–7.7)
Platelets: 282 10*3/uL (ref 150–400)
RBC: 4.14 MIL/uL (ref 3.87–5.11)
RDW: 14.5 % (ref 11.5–15.5)
WBC: 3.3 10*3/uL — AB (ref 4.0–10.5)

## 2015-01-22 LAB — COMPREHENSIVE METABOLIC PANEL
ALK PHOS: 58 U/L (ref 39–117)
ALT: 16 U/L (ref 0–35)
AST: 28 U/L (ref 0–37)
Albumin: 4.3 g/dL (ref 3.5–5.2)
BILIRUBIN TOTAL: 0.6 mg/dL (ref 0.2–1.2)
BUN: 7 mg/dL (ref 6–23)
CO2: 25 meq/L (ref 19–32)
CREATININE: 0.99 mg/dL (ref 0.50–1.10)
Calcium: 9.8 mg/dL (ref 8.4–10.5)
Chloride: 102 mEq/L (ref 96–112)
GLUCOSE: 79 mg/dL (ref 70–99)
Potassium: 4 mEq/L (ref 3.5–5.3)
Sodium: 141 mEq/L (ref 135–145)
TOTAL PROTEIN: 6.8 g/dL (ref 6.0–8.3)

## 2015-01-22 LAB — LIPID PANEL
Cholesterol: 170 mg/dL (ref 0–200)
HDL: 76 mg/dL (ref >39)
LDL CALC: 83 mg/dL (ref 0–99)
TRIGLYCERIDES: 55 mg/dL (ref <150)
Total CHOL/HDL Ratio: 2.2 Ratio
VLDL: 11 mg/dL (ref 0–40)

## 2015-01-22 LAB — HEMOGLOBIN A1C
HEMOGLOBIN A1C: 6.4 % — AB (ref <5.7)
MEAN PLASMA GLUCOSE: 137 mg/dL — AB (ref <117)

## 2015-01-22 LAB — MICROALBUMIN / CREATININE URINE RATIO
CREATININE, URINE: 81.6 mg/dL
MICROALB UR: 0.2 mg/dL (ref <2.0)
Microalb Creat Ratio: 2.5 mg/g (ref 0.0–30.0)

## 2015-01-22 MED ORDER — ESCITALOPRAM OXALATE 10 MG PO TABS: 10.0000 mg | ORAL_TABLET | Freq: Every day | ORAL | Status: DC

## 2015-01-22 NOTE — Assessment & Plan Note (Signed)
MTF as prescribed, recheck A1C and renal function

## 2015-01-22 NOTE — Assessment & Plan Note (Signed)
Recheck LDL on statin drug , tolerating without difficulty

## 2015-01-22 NOTE — Progress Notes (Signed)
Patient ID: Theresa Howe, female   DOB: 19-Aug-1931, 81 y.o.   MRN: [id number]   Subjective:    Patient ID: Theresa Howe, female    DOB: Dec 01, 1931, 81 y.o.   MRN: [id number]  Patient presents for 4 month F/U  patient had a follow-up chronic medical problems. She has been under a lot of stress recently with her niece and does not think that her Lexapro dose is strong enough. She signs herself still getting quite upset and anxious. She denies any suicidal ideations. She sleeps okay however she does go to bed quite early therefore wakes up very early in the morning.  Diabetes mellitus her blood sugars have been around the low 100s fasting and she has not had any hypoglycemia symptoms no polyuria polydipsia. She does get urinary frequency at nighttime but tends to drink a lot of caffeine such as coffee and teabefore bedtime. She has not had any urinary incontinence  Meds reviewed    Review Of Systems:  GEN- denies fatigue, fever, weight loss,weakness, recent illness HEENT- denies eye drainage, change in vision, nasal discharge, CVS- denies chest pain, palpitations RESP- denies SOB, cough, wheeze ABD- denies N/V, change in stools, abd pain GU- denies dysuria, hematuria, dribbling, incontinence MSK- + joint pain, muscle aches, injury Neuro- denies headache, dizziness, syncope, seizure activity       Objective:    BP 128/74 mmHg  Pulse 68  Temp(Src) 98.1 F (36.7 C) (Oral)  Resp 16  Ht 5\' 4"  (1.626 m)  Wt 136 lb (61.689 kg)  BMI 23.33 kg/m2 GEN- NAD, alert and oriented x3 HEENT- PERRL, EOMI, non injected sclera, pink conjunctiva, MMM, oropharynx clear Neck- Supple, no thyromegaly CVS- RRR, no murmur RESP-CTAB ABD-NABS,soft,NT,ND Psych- normal affect and mood, very pleasant EXT- No edema Pulses- Radial, DP- 2+        Assessment & Plan:      Problem List Items Addressed This Visit      Unprioritized   Hypertension associated with diabetes - Primary   Relevant  Orders   CBC with Differential/Platelet   Comprehensive metabolic panel   Hyperlipidemia LDL goal <70   Relevant Orders   Lipid panel   Diabetes mellitus   Relevant Orders   Hemoglobin A1c   HM DIABETES FOOT EXAM (Completed)   Microalbumin / creatinine urine ratio   Anxiety state   Relevant Medications   escitalopram (LEXAPRO) tablet      Note: This dictation was prepared with Dragon dictation along with smaller phrase technology. Any transcriptional errors that result from this process are unintentional.

## 2015-01-22 NOTE — Patient Instructions (Signed)
New dose of stress medication We will call with lab results F/U 4 months

## 2015-01-22 NOTE — Assessment & Plan Note (Signed)
Well controlled no change to meds 

## 2015-01-22 NOTE — Assessment & Plan Note (Signed)
Increase lexapro to 10mg  at bedtime

## 2015-03-23 DIAGNOSIS — N8111 Cystocele, midline: Secondary | ICD-10-CM | POA: Diagnosis not present

## 2015-04-14 ENCOUNTER — Ambulatory Visit: Payer: Medicare Other | Admitting: Family Medicine

## 2015-04-14 ENCOUNTER — Ambulatory Visit (HOSPITAL_COMMUNITY)
Admission: RE | Admit: 2015-04-14 | Discharge: 2015-04-14 | Disposition: A | Discharge status: Home / Self Care | Payer: Medicare Other | Source: Ambulatory Visit | Attending: Family Medicine | Admitting: Family Medicine

## 2015-04-14 ENCOUNTER — Emergency Department (HOSPITAL_COMMUNITY)
Admission: EM | Admit: 2015-04-14 | Discharge: 2015-04-14 | Discharge status: Against Medical Advice | Payer: Medicare Other | Attending: Emergency Medicine | Admitting: Emergency Medicine

## 2015-04-14 ENCOUNTER — Ambulatory Visit (INDEPENDENT_AMBULATORY_CARE_PROVIDER_SITE_OTHER): Payer: Medicare Other | Admitting: Family Medicine

## 2015-04-14 ENCOUNTER — Encounter: Payer: Self-pay | Admitting: Family Medicine

## 2015-04-14 ENCOUNTER — Encounter (HOSPITAL_COMMUNITY): Payer: Self-pay | Admitting: Emergency Medicine

## 2015-04-14 VITALS — BP 126/68 | HR 72 | Temp 98.3°F | Resp 16 | Ht 64.0 in | Wt 132.0 lb

## 2015-04-14 DIAGNOSIS — R42 Dizziness and giddiness: Secondary | ICD-10-CM | POA: Diagnosis not present

## 2015-04-14 DIAGNOSIS — R51 Headache: Secondary | ICD-10-CM | POA: Diagnosis not present

## 2015-04-14 DIAGNOSIS — W19XXXA Unspecified fall, initial encounter: Secondary | ICD-10-CM | POA: Diagnosis not present

## 2015-04-14 DIAGNOSIS — R55 Syncope and collapse: Secondary | ICD-10-CM | POA: Diagnosis not present

## 2015-04-14 DIAGNOSIS — M25511 Pain in right shoulder: Secondary | ICD-10-CM

## 2015-04-14 DIAGNOSIS — E119 Type 2 diabetes mellitus without complications: Secondary | ICD-10-CM | POA: Insufficient documentation

## 2015-04-14 DIAGNOSIS — G44319 Acute post-traumatic headache, not intractable: Secondary | ICD-10-CM

## 2015-04-14 DIAGNOSIS — M542 Cervicalgia: Secondary | ICD-10-CM | POA: Diagnosis not present

## 2015-04-14 DIAGNOSIS — I10 Essential (primary) hypertension: Secondary | ICD-10-CM | POA: Insufficient documentation

## 2015-04-14 DIAGNOSIS — S0990XA Unspecified injury of head, initial encounter: Secondary | ICD-10-CM | POA: Diagnosis not present

## 2015-04-14 MED ORDER — TRAMADOL HCL 50 MG PO TABS: 50.0000 mg | ORAL_TABLET | Freq: Two times a day (BID) | ORAL | Status: DC | PRN

## 2015-04-14 NOTE — Progress Notes (Signed)
Patient ID: Theresa Howe, female   DOB: Sep 26, 1931, 81 y.o.   MRN: [id number]   Subjective:    Patient ID: Theresa Howe, female    DOB: July 05, 1931, 81 y.o.   MRN: [id number]  Patient presents for Fall and Edema  patient here status post fall. She fell on April 5 she states that she was very tired and she sat on some steps that she has by her bed and she fell asleep and fell over onto the floor she states that her floors concrete and has carpet over top. She had a large knot on her head on the right side as well as a smaller not she also hurt her shoulder and has had pain in her neck ever since then. She's had intermittent headaches for a while as well. She states that she thinks she blacked out for a few moments before she woke up on the floor. It seems that she tried to seek care she went to urgent care they told her she needed to be seen in the emergency room is that of going to the emergency room she actually went to the radiology department and they told her that she needed an order to have her head scan she became frustrated and went back home. She presents today with the ongoing symptoms. She states that her blood pressure and her blood sugar have been good. She is only taking aspirin for the pain which helps some. She also noted today that she had some mild swelling of her right foot where her sandal ended   Review Of Systems:  GEN- denies fatigue, fever, weight loss,weakness, recent illness HEENT- denies eye drainage, change in vision, nasal discharge, CVS- denies chest pain, palpitations RESP- denies SOB, cough, wheeze ABD- denies N/V, change in stools, abd pain GU- denies dysuria, hematuria, dribbling, incontinence MSK- + joint pain, +muscle aches, injury Neuro- + headache, dizziness, syncope, seizure activity       Objective:    BP 126/68 mmHg  Pulse 72  Temp(Src) 98.3 F (36.8 C) (Oral)  Resp 16  Ht 5\' 4"  (1.626 m)  Wt 132 lb (59.875 kg)  BMI 22.65 kg/m2 GEN- NAD,  alert and oriented x3 HEENT- PERRL, EOMI, non injected sclera, pink conjunctiva, MMM, oropharynx clear Neck- Supple, mild TTP along right side of neck, no spasm noted, fair ROM CVS- RRR, no murmur RESP-CTAB ABD-NABS,soft,NT,ND MSK- Right parieto region- small knot palpated, NT,no erythema,no fluctuance, bilat UE fair ROM, TTP along, pain with empty can on right side, biceps in tact, no impingement signs, Skin-  no bruising of skin Neuro-CNII-XII intact, no focal deficits EXT- trace of over midfoot of right foot with sandal imprint Pulses- Radial, DP- 2     Assessment & Plan:    No significant swelling in feet   Problem List Items Addressed This Visit    None    Visit Diagnoses    Fall, initial encounter    -  Primary    Relevant Orders    CT Head Wo Contrast    DG Cervical Spine Complete    Neck pain        Relevant Orders    DG Cervical Spine Complete    Pain in joint, shoulder region, right        Xray of shoulder and neck to be done, doubt fracture but still in pain, possible strain on rotator cuff depending on how she fell. Ultram for pain    Relevant Orders    DG  Shoulder Right    Acute post-traumatic headache, not intractable        Concern for ongoing HA, she does have small hematoma now resolving, STAT CT of head to be done, mentation is at baseline and neurological exam    Relevant Medications    traMADol (ULTRAM) 50 MG tablet    Other Relevant Orders    CT Head Wo Contrast       Note: This dictation was prepared with Dragon dictation along with smaller phrase technology. Any transcriptional errors that result from this process are unintentional.

## 2015-04-14 NOTE — ED Provider Notes (Signed)
Pt registered, then after speaking with her PCP, pt has elected to NOT be seen in ER, but to go to Radiology for out patient studies ordered by her PCP. Not seen by ER MD.  Tanna Furry, MD 04/14/15 [phone]

## 2015-04-14 NOTE — ED Notes (Signed)
Pt reports headache since falling off of a stool on April 5th. Pt denies being on any blood thinners. Pt reports hit head and brief loc. Pt alert and oriented. nad noted.

## 2015-04-14 NOTE — Patient Instructions (Signed)
Take the ultram for pain Continue Aspirin as needed We will call with results F/U as previous

## 2015-04-15 ENCOUNTER — Other Ambulatory Visit: Payer: Self-pay | Admitting: Family Medicine

## 2015-04-15 ENCOUNTER — Ambulatory Visit (HOSPITAL_COMMUNITY)
Admission: RE | Admit: 2015-04-15 | Discharge: 2015-04-15 | Disposition: A | Discharge status: Home / Self Care | Payer: Medicare Other | Source: Ambulatory Visit | Attending: Family Medicine | Admitting: Family Medicine

## 2015-04-15 DIAGNOSIS — W1789XA Other fall from one level to another, initial encounter: Secondary | ICD-10-CM | POA: Insufficient documentation

## 2015-04-15 DIAGNOSIS — M25511 Pain in right shoulder: Secondary | ICD-10-CM | POA: Diagnosis not present

## 2015-04-15 DIAGNOSIS — W19XXXA Unspecified fall, initial encounter: Secondary | ICD-10-CM

## 2015-04-15 DIAGNOSIS — S4991XA Unspecified injury of right shoulder and upper arm, initial encounter: Secondary | ICD-10-CM | POA: Diagnosis not present

## 2015-04-15 DIAGNOSIS — M5031 Other cervical disc degeneration,  high cervical region: Secondary | ICD-10-CM | POA: Diagnosis not present

## 2015-04-15 DIAGNOSIS — S199XXA Unspecified injury of neck, initial encounter: Secondary | ICD-10-CM | POA: Diagnosis not present

## 2015-04-15 DIAGNOSIS — M7581 Other shoulder lesions, right shoulder: Secondary | ICD-10-CM

## 2015-04-15 DIAGNOSIS — M542 Cervicalgia: Secondary | ICD-10-CM | POA: Diagnosis not present

## 2015-04-15 DIAGNOSIS — R937 Abnormal findings on diagnostic imaging of other parts of musculoskeletal system: Secondary | ICD-10-CM | POA: Insufficient documentation

## 2015-04-15 DIAGNOSIS — S43431A Superior glenoid labrum lesion of right shoulder, initial encounter: Secondary | ICD-10-CM

## 2015-04-20 DIAGNOSIS — H4050X0 Glaucoma secondary to other eye disorders, unspecified eye, stage unspecified: Secondary | ICD-10-CM | POA: Diagnosis not present

## 2015-04-20 DIAGNOSIS — Z961 Presence of intraocular lens: Secondary | ICD-10-CM | POA: Diagnosis not present

## 2015-04-20 DIAGNOSIS — H2512 Age-related nuclear cataract, left eye: Secondary | ICD-10-CM | POA: Diagnosis not present

## 2015-04-20 DIAGNOSIS — H538 Other visual disturbances: Secondary | ICD-10-CM | POA: Diagnosis not present

## 2015-04-22 ENCOUNTER — Other Ambulatory Visit: Payer: Self-pay | Admitting: *Deleted

## 2015-04-22 MED ORDER — ESCITALOPRAM OXALATE 10 MG PO TABS: 10.0000 mg | ORAL_TABLET | Freq: Every day | ORAL | Status: DC

## 2015-04-22 NOTE — Telephone Encounter (Signed)
Received fax requesting refill on Lexapro.   Refill appropriate and filled per protocol.  

## 2015-04-28 DIAGNOSIS — M542 Cervicalgia: Secondary | ICD-10-CM | POA: Diagnosis not present

## 2015-04-28 DIAGNOSIS — M5030 Other cervical disc degeneration, unspecified cervical region: Secondary | ICD-10-CM | POA: Diagnosis not present

## 2015-05-25 ENCOUNTER — Encounter: Payer: Self-pay | Admitting: Family Medicine

## 2015-05-25 ENCOUNTER — Ambulatory Visit (INDEPENDENT_AMBULATORY_CARE_PROVIDER_SITE_OTHER): Payer: Medicare Other | Admitting: Family Medicine

## 2015-05-25 VITALS — BP 130/78 | HR 68 | Temp 97.9°F | Resp 14 | Ht 63.0 in | Wt 131.0 lb

## 2015-05-25 DIAGNOSIS — I152 Hypertension secondary to endocrine disorders: Secondary | ICD-10-CM

## 2015-05-25 DIAGNOSIS — E785 Hyperlipidemia, unspecified: Secondary | ICD-10-CM | POA: Diagnosis not present

## 2015-05-25 DIAGNOSIS — E119 Type 2 diabetes mellitus without complications: Secondary | ICD-10-CM | POA: Diagnosis not present

## 2015-05-25 DIAGNOSIS — E1169 Type 2 diabetes mellitus with other specified complication: Secondary | ICD-10-CM | POA: Diagnosis not present

## 2015-05-25 DIAGNOSIS — I10 Essential (primary) hypertension: Secondary | ICD-10-CM | POA: Diagnosis not present

## 2015-05-25 DIAGNOSIS — E1159 Type 2 diabetes mellitus with other circulatory complications: Secondary | ICD-10-CM

## 2015-05-25 LAB — CBC WITH DIFFERENTIAL/PLATELET
BASOS PCT: 2 % — AB (ref 0–1)
Basophils Absolute: 0.1 10*3/uL (ref 0.0–0.1)
Eosinophils Absolute: 0.1 10*3/uL (ref 0.0–0.7)
Eosinophils Relative: 3 % (ref 0–5)
HCT: 37 % (ref 36.0–46.0)
Hemoglobin: 11.8 g/dL — ABNORMAL LOW (ref 12.0–15.0)
Lymphocytes Relative: 34 % (ref 12–46)
Lymphs Abs: 1.2 10*3/uL (ref 0.7–4.0)
MCH: 27.9 pg (ref 26.0–34.0)
MCHC: 31.9 g/dL (ref 30.0–36.0)
MCV: 87.5 fL (ref 78.0–100.0)
MONOS PCT: 5 % (ref 3–12)
MPV: 10 fL (ref 8.6–12.4)
Monocytes Absolute: 0.2 10*3/uL (ref 0.1–1.0)
Neutro Abs: 2 10*3/uL (ref 1.7–7.7)
Neutrophils Relative %: 56 % (ref 43–77)
Platelets: 296 10*3/uL (ref 150–400)
RBC: 4.23 MIL/uL (ref 3.87–5.11)
RDW: 14.5 % (ref 11.5–15.5)
WBC: 3.5 10*3/uL — ABNORMAL LOW (ref 4.0–10.5)

## 2015-05-25 NOTE — Progress Notes (Signed)
Patient ID: Theresa Howe, female   DOB: 1931-02-11, 81 y.o.   MRN: [id number]   Subjective:    Patient ID: Theresa Howe, female    DOB: 21-Jul-1931, 81 y.o.   MRN: [id number]  Patient presents for 4 month F/U  patient to follow-up chronic medical problems. She has no particular concerns. She's history of diabetes mellitus hypertension hyperlipidemia. She states her sugars have been good. Her last A1c was 6.4% her fasting blood sugars are less than 120. No hypoglycemia and no polyuria or polydipsia. Regarding her last visit where she had the fall she was seen by orthopedics there was no particular intervention she was actually quite upset about the office visit she felt like they did not listen to her. But her shoulder pain is now resolved and she has not had any further headaches.    Review Of Systems:  GEN- denies fatigue, fever, weight loss,weakness, recent illness HEENT- denies eye drainage, change in vision, nasal discharge, CVS- denies chest pain, palpitations RESP- denies SOB, cough, wheeze ABD- denies N/V, change in stools, abd pain GU- denies dysuria, hematuria, dribbling, incontinence MSK- denies joint pain, muscle aches, injury Neuro- denies headache, dizziness, syncope, seizure activity       Objective:    BP 130/78 mmHg  Pulse 68  Temp(Src) 97.9 F (36.6 C) (Oral)  Resp 14  Ht 5\' 3"  (1.6 m)  Wt 131 lb (59.421 kg)  BMI 23.21 kg/m2 GEN- NAD, alert and oriented x3 HEENT- PERRL, EOMI, non injected sclera, pink conjunctiva, MMM, oropharynx clear CVS- RRR, no murmur RESP-CTAB EXT- No edema Pulses- Radial, DP- 2+        Assessment & Plan:      Problem List Items Addressed This Visit    Hypertension associated with diabetes - Primary   Relevant Orders   Comprehensive metabolic panel   CBC with Differential/Platelet   Hyperlipidemia LDL goal <70   Relevant Orders   Lipid panel   Diabetes mellitus   Relevant Orders   Hemoglobin A1c      Note: This  dictation was prepared with Dragon dictation along with smaller phrase technology. Any transcriptional errors that result from this process are unintentional.

## 2015-05-25 NOTE — Assessment & Plan Note (Signed)
Well controlled recheck A1C On statin and ACEI

## 2015-05-25 NOTE — Assessment & Plan Note (Signed)
Well controlled no change to meds 

## 2015-05-25 NOTE — Patient Instructions (Addendum)
Try the GLucerna or Boost for diabetes Continue current medications Release of records Dr. Frederico Hamman - Eye doctor - [facility] [place] F/U 4 months - Physical

## 2015-05-26 LAB — COMPREHENSIVE METABOLIC PANEL
ALT: 17 U/L (ref 0–35)
AST: 28 U/L (ref 0–37)
Albumin: 4 g/dL (ref 3.5–5.2)
Alkaline Phosphatase: 49 U/L (ref 39–117)
BUN: 9 mg/dL (ref 6–23)
CALCIUM: 9 mg/dL (ref 8.4–10.5)
CHLORIDE: 102 meq/L (ref 96–112)
CO2: 26 mEq/L (ref 19–32)
CREATININE: 0.99 mg/dL (ref 0.50–1.10)
Glucose, Bld: 86 mg/dL (ref 70–99)
Potassium: 3.7 mEq/L (ref 3.5–5.3)
SODIUM: 138 meq/L (ref 135–145)
Total Bilirubin: 0.5 mg/dL (ref 0.2–1.2)
Total Protein: 6.3 g/dL (ref 6.0–8.3)

## 2015-05-26 LAB — LIPID PANEL
CHOL/HDL RATIO: 2.3 ratio
Cholesterol: 171 mg/dL (ref 0–200)
HDL: 75 mg/dL (ref >=46)
LDL CALC: 79 mg/dL (ref 0–99)
Triglycerides: 84 mg/dL (ref <150)
VLDL: 17 mg/dL (ref 0–40)

## 2015-05-26 LAB — HEMOGLOBIN A1C
Hgb A1c MFr Bld: 6.6 % — ABNORMAL HIGH (ref <5.7)
MEAN PLASMA GLUCOSE: 143 mg/dL — AB (ref <117)

## 2015-05-27 ENCOUNTER — Encounter: Payer: Self-pay | Admitting: *Deleted

## 2015-05-31 ENCOUNTER — Telehealth: Payer: Self-pay | Admitting: Family Medicine

## 2015-05-31 MED ORDER — ALENDRONATE SODIUM 70 MG PO TABS: 70.0000 mg | ORAL_TABLET | ORAL | Status: DC

## 2015-05-31 NOTE — Telephone Encounter (Signed)
Prescription sent to pharmacy.

## 2015-05-31 NOTE — Telephone Encounter (Signed)
Patient needs refill on alendronate  If possible  [facility]  [phone] if any questions for patient

## 2015-06-23 ENCOUNTER — Other Ambulatory Visit: Payer: Self-pay | Admitting: Family Medicine

## 2015-06-23 ENCOUNTER — Other Ambulatory Visit: Payer: Self-pay

## 2015-06-23 DIAGNOSIS — Z1231 Encounter for screening mammogram for malignant neoplasm of breast: Secondary | ICD-10-CM

## 2015-06-24 ENCOUNTER — Ambulatory Visit: Payer: Medicare Other

## 2015-06-24 DIAGNOSIS — N8111 Cystocele, midline: Secondary | ICD-10-CM | POA: Diagnosis not present

## 2015-06-24 DIAGNOSIS — Z1231 Encounter for screening mammogram for malignant neoplasm of breast: Secondary | ICD-10-CM | POA: Diagnosis not present

## 2015-06-25 ENCOUNTER — Telehealth: Payer: Self-pay | Admitting: *Deleted

## 2015-06-25 NOTE — Telephone Encounter (Signed)
Discussed with MD.   Advised that if S/Sx return to go to ER for eval.   Call placed to patient and patient made aware.

## 2015-06-25 NOTE — Telephone Encounter (Signed)
Agree with above 

## 2015-06-25 NOTE — Telephone Encounter (Signed)
Received call from patient.   Reports that she woke up last night about 10:30- 11pm with swelling and pain to the R side of her neck. States that she went back to sleep and woke up again with nosebleed. States that blood noted bright red to begin with, but then progressed to brown, then black in color. States that blood noted to be coming out of B nares. Reports that swelling and pain resolved after nosebleed.   Reports that neck has swollen in the past, but has not caused nosebleed.   MD please advise.

## 2015-07-05 ENCOUNTER — Other Ambulatory Visit: Payer: Self-pay | Admitting: Family Medicine

## 2015-07-06 NOTE — Telephone Encounter (Signed)
Medication refilled per protocol. 

## 2015-07-16 ENCOUNTER — Other Ambulatory Visit: Payer: Self-pay | Admitting: Family Medicine

## 2015-07-16 NOTE — Telephone Encounter (Signed)
Medication refilled per protocol. 

## 2015-07-19 ENCOUNTER — Other Ambulatory Visit: Payer: Self-pay | Admitting: Family Medicine

## 2015-07-19 NOTE — Telephone Encounter (Signed)
Medication refilled per protocol. 

## 2015-07-27 ENCOUNTER — Other Ambulatory Visit: Payer: Self-pay | Admitting: Family Medicine

## 2015-07-27 NOTE — Telephone Encounter (Signed)
Medication refilled per protocol. 

## 2015-08-19 ENCOUNTER — Other Ambulatory Visit: Payer: Self-pay | Admitting: Family Medicine

## 2015-08-19 NOTE — Telephone Encounter (Signed)
Refill appropriate and filled per protocol. 

## 2015-08-24 DIAGNOSIS — H538 Other visual disturbances: Secondary | ICD-10-CM | POA: Diagnosis not present

## 2015-08-24 DIAGNOSIS — H2512 Age-related nuclear cataract, left eye: Secondary | ICD-10-CM | POA: Diagnosis not present

## 2015-08-24 DIAGNOSIS — H4050X0 Glaucoma secondary to other eye disorders, unspecified eye, stage unspecified: Secondary | ICD-10-CM | POA: Diagnosis not present

## 2015-09-28 ENCOUNTER — Ambulatory Visit (INDEPENDENT_AMBULATORY_CARE_PROVIDER_SITE_OTHER): Payer: Medicare Other | Admitting: Family Medicine

## 2015-09-28 ENCOUNTER — Encounter: Payer: Self-pay | Admitting: Family Medicine

## 2015-09-28 VITALS — BP 104/62 | HR 68 | Temp 98.2°F | Resp 16 | Ht 63.0 in | Wt 132.0 lb

## 2015-09-28 DIAGNOSIS — E1143 Type 2 diabetes mellitus with diabetic autonomic (poly)neuropathy: Secondary | ICD-10-CM

## 2015-09-28 DIAGNOSIS — Z23 Encounter for immunization: Secondary | ICD-10-CM

## 2015-09-28 DIAGNOSIS — E1159 Type 2 diabetes mellitus with other circulatory complications: Secondary | ICD-10-CM | POA: Diagnosis not present

## 2015-09-28 DIAGNOSIS — E119 Type 2 diabetes mellitus without complications: Secondary | ICD-10-CM

## 2015-09-28 DIAGNOSIS — I10 Essential (primary) hypertension: Secondary | ICD-10-CM | POA: Diagnosis not present

## 2015-09-28 DIAGNOSIS — E1140 Type 2 diabetes mellitus with diabetic neuropathy, unspecified: Secondary | ICD-10-CM | POA: Insufficient documentation

## 2015-09-28 DIAGNOSIS — F411 Generalized anxiety disorder: Secondary | ICD-10-CM | POA: Diagnosis not present

## 2015-09-28 DIAGNOSIS — Z0000 Encounter for general adult medical examination without abnormal findings: Secondary | ICD-10-CM

## 2015-09-28 DIAGNOSIS — H409 Unspecified glaucoma: Secondary | ICD-10-CM

## 2015-09-28 DIAGNOSIS — I152 Hypertension secondary to endocrine disorders: Secondary | ICD-10-CM

## 2015-09-28 LAB — CBC WITH DIFFERENTIAL/PLATELET
Basophils Absolute: 0 10*3/uL (ref 0.0–0.1)
Basophils Relative: 1 % (ref 0–1)
EOS ABS: 0.1 10*3/uL (ref 0.0–0.7)
Eosinophils Relative: 3 % (ref 0–5)
HCT: 36.2 % (ref 36.0–46.0)
Hemoglobin: 11.9 g/dL — ABNORMAL LOW (ref 12.0–15.0)
LYMPHS ABS: 1.1 10*3/uL (ref 0.7–4.0)
LYMPHS PCT: 28 % (ref 12–46)
MCH: 27.7 pg (ref 26.0–34.0)
MCHC: 32.9 g/dL (ref 30.0–36.0)
MCV: 84.2 fL (ref 78.0–100.0)
MONOS PCT: 10 % (ref 3–12)
MPV: 10 fL (ref 8.6–12.4)
Monocytes Absolute: 0.4 10*3/uL (ref 0.1–1.0)
NEUTROS PCT: 58 % (ref 43–77)
Neutro Abs: 2.2 10*3/uL (ref 1.7–7.7)
PLATELETS: 315 10*3/uL (ref 150–400)
RBC: 4.3 MIL/uL (ref 3.87–5.11)
RDW: 14.4 % (ref 11.5–15.5)
WBC: 3.8 10*3/uL — ABNORMAL LOW (ref 4.0–10.5)

## 2015-09-28 LAB — COMPREHENSIVE METABOLIC PANEL
ALBUMIN: 4.1 g/dL (ref 3.6–5.1)
ALT: 14 U/L (ref 6–29)
AST: 26 U/L (ref 10–35)
Alkaline Phosphatase: 54 U/L (ref 33–130)
BUN: 13 mg/dL (ref 7–25)
CHLORIDE: 99 mmol/L (ref 98–110)
CO2: 27 mmol/L (ref 20–31)
Calcium: 9.4 mg/dL (ref 8.6–10.4)
Creat: 0.93 mg/dL — ABNORMAL HIGH (ref 0.60–0.88)
Glucose, Bld: 72 mg/dL (ref 70–99)
Potassium: 3.6 mmol/L (ref 3.5–5.3)
Sodium: 137 mmol/L (ref 135–146)
Total Bilirubin: 0.6 mg/dL (ref 0.2–1.2)
Total Protein: 6.6 g/dL (ref 6.1–8.1)

## 2015-09-28 LAB — LIPID PANEL
Cholesterol: 162 mg/dL (ref 125–200)
HDL: 70 mg/dL (ref >=46)
LDL Cholesterol: 76 mg/dL (ref <130)
TRIGLYCERIDES: 81 mg/dL (ref <150)
Total CHOL/HDL Ratio: 2.3 Ratio (ref <=5.0)
VLDL: 16 mg/dL (ref <30)

## 2015-09-28 MED ORDER — GABAPENTIN 100 MG PO CAPS: ORAL_CAPSULE | ORAL | Status: DC

## 2015-09-28 NOTE — Assessment & Plan Note (Signed)
Well controlled 

## 2015-09-28 NOTE — Patient Instructions (Addendum)
Continue current medications FLu shot given We will call with lab results Release of records- Dr. Edwyna Ready- last exam and Mammogram  Start the gabapentin- take 1 at bedtime for a week, then go up to 2  dIABETIC SHOES to be ordered  F/U 4 months

## 2015-09-28 NOTE — Progress Notes (Signed)
Patient ID: Theresa Howe, female   DOB: Dec 17, 1931, 81 y.o.   MRN: [id number] Subjective:   Patient presents for Medicare Annual/Subsequent preventive examination.    Pt here for Wellness exam, doing fairly well. Seen by GYN recently had Mammogram UTD   DM- blood sugars have been good , 120 fasting, no hypoglycemia    She has had burning sensation on both feet at the tips for past few months, also has a corn on her foot that is painful. Recently started using OTC corn medication.   Had acid reflux, thsi resolved after stopped eating tomatoes Review Past Medical/Family/Social: Per EMR   Risk Factors  Current exercise habits: walks Dietary issues discussed: None of concern  Cardiac risk factors: HTN,DM  Depression Screen -- Current treatment  (Note: if answer to either of the following is "Yes", a more complete depression screening is indicated)  Over the past two weeks, have you felt down, depressed or hopeless? No Over the past two weeks, have you felt little interest or pleasure in doing things? No Have you lost interest or pleasure in daily life? No Do you often feel hopeless? No Do you cry easily over simple problems? No   Activities of Daily Living  In your present state of health, do you have any difficulty performing the following activities?:  Driving? No  Managing money? No  Feeding yourself? No  Getting from bed to chair? No  Climbing a flight of stairs? No  Preparing food and eating?: No  Bathing or showering? No  Getting dressed: No  Getting to the toilet? No  Using the toilet:No  Moving around from place to place: No  In the past year have you fallen or had a near fall?:No  Are you sexually active? No  Do you have more than one partner? No   Hearing Difficulties: No  Do you often ask people to speak up or repeat themselves? No  Do you experience ringing or noises in your ears? No Do you have difficulty understanding soft or whispered voices? No  Do you feel  that you have a problem with memory? No Do you often misplace items? No  Do you feel safe at home? Yes  Cognitive Testing  Alert? Yes Normal Appearance?Yes  Oriented to person? Yes Place? Yes  Time? Yes  Recall of three objects? Yes  Can perform simple calculations? Yes  Displays appropriate judgment?Yes  Can read the correct time from a watch face?Yes   List the Names of Other Physician/Practitioners you currently use:   Dr. Rosemary Holms   Screening Tests / Date Colonoscopy  - age limit                   Zostavax -unable to afford Mammogram -UTD 2015 Influenza Vaccine Due today  Tetanus/tdap- UTD  ROS: GEN- denies fatigue, fever, weight loss,weakness, recent illness HEENT- denies eye drainage, change in vision, nasal discharge, CVS- denies chest pain, palpitations RESP- denies SOB, cough, wheeze ABD- denies N/V, change in stools, abd pain GU- denies dysuria, hematuria, dribbling, incontinence MSK- denies joint pain, muscle aches, injury Neuro- denies headache, dizziness, syncope, seizure activity  PHYSICAL: GEN- NAD, alert and oriented x3 HEENT- PERRL, EOMI, non injected sclera, pink conjunctiva, MMM, oropharynx clear Neck- Supple, no thryomegaly CVS- RRR, no murmur RESP-CTAB EXT- No edema, hammer toe- Right foot 2nd digit, corn 4th digit right foot Pulses- Radial, DP- 2+     Assessment:    Annual wellness medicare exam   Plan:  During the course of the visit the patient was educated and counseled about appropriate screening and preventive services including:  Screening mammography - obtain from GYN Fasting labs  Diabetic shoes to be ordered    Diet review for nutrition referral? Yes ____ Not Indicated __x__  Patient Instructions (the written plan) was given to the patient.  Medicare Attestation  I have personally reviewed:  The patient's medical and social history  Their use of alcohol, tobacco or illicit drugs  Their current medications and supplements   The patient's functional ability including ADLs,fall risks, home safety risks, cognitive, and hearing and visual impairment  Diet and physical activities  Evidence for depression or mood disorders  The patient's weight, height, BMI, and visual acuity have been recorded in the chart. I have made referrals, counseling, and provided education to the patient based on review of the above and I have provided the patient with a written personalized care plan for preventive services.

## 2015-09-28 NOTE — Assessment & Plan Note (Signed)
DM has been well-controlled, continue current meds Recheck A1C Diabetic shoes Start gabapentin at bedtime for neuropathic symptoms

## 2015-09-28 NOTE — Assessment & Plan Note (Signed)
Continue drops per opthalmology

## 2015-09-29 ENCOUNTER — Other Ambulatory Visit: Payer: Self-pay | Admitting: *Deleted

## 2015-09-29 ENCOUNTER — Encounter: Payer: Self-pay | Admitting: *Deleted

## 2015-09-29 LAB — HEMOGLOBIN A1C
HEMOGLOBIN A1C: 6.5 % — AB (ref <5.7)
Mean Plasma Glucose: 140 mg/dL — ABNORMAL HIGH (ref <117)

## 2015-09-29 MED ORDER — GABAPENTIN 100 MG PO CAPS: ORAL_CAPSULE | ORAL | Status: DC

## 2015-09-29 MED ORDER — FLUTICASONE PROPIONATE 50 MCG/ACT NA SUSP: 2.0000 | Freq: Every day | NASAL | Status: DC

## 2015-09-29 MED ORDER — VALSARTAN-HYDROCHLOROTHIAZIDE 160-12.5 MG PO TABS: 1.0000 | ORAL_TABLET | Freq: Every day | ORAL | Status: DC

## 2015-09-29 MED ORDER — SIMVASTATIN 40 MG PO TABS: ORAL_TABLET | ORAL | Status: DC

## 2015-09-29 MED ORDER — METFORMIN HCL 1000 MG PO TABS: 500.0000 mg | ORAL_TABLET | Freq: Two times a day (BID) | ORAL | Status: DC

## 2015-09-29 MED ORDER — GLIMEPIRIDE 2 MG PO TABS: 2.0000 mg | ORAL_TABLET | Freq: Every day | ORAL | Status: DC

## 2015-09-29 MED ORDER — ALENDRONATE SODIUM 70 MG PO TABS: 70.0000 mg | ORAL_TABLET | ORAL | Status: DC

## 2015-09-29 MED ORDER — ALBUTEROL SULFATE HFA 108 (90 BASE) MCG/ACT IN AERS: 2.0000 | INHALATION_SPRAY | Freq: Four times a day (QID) | RESPIRATORY_TRACT | Status: DC | PRN

## 2015-09-29 MED ORDER — ESCITALOPRAM OXALATE 10 MG PO TABS: 10.0000 mg | ORAL_TABLET | Freq: Every day | ORAL | Status: DC

## 2015-10-06 ENCOUNTER — Telehealth: Payer: Self-pay | Admitting: Family Medicine

## 2015-10-06 DIAGNOSIS — N8111 Cystocele, midline: Secondary | ICD-10-CM | POA: Diagnosis not present

## 2015-10-06 NOTE — Telephone Encounter (Signed)
Patient is calling to get some clarification on directions for her gabapentin [phone]

## 2015-10-06 NOTE — Telephone Encounter (Signed)
Call placed to patient and directions clarified

## 2015-10-18 DIAGNOSIS — L89892 Pressure ulcer of other site, stage 2: Secondary | ICD-10-CM | POA: Diagnosis not present

## 2015-10-18 DIAGNOSIS — E1140 Type 2 diabetes mellitus with diabetic neuropathy, unspecified: Secondary | ICD-10-CM | POA: Diagnosis not present

## 2015-10-19 ENCOUNTER — Telehealth: Payer: Self-pay | Admitting: Family Medicine

## 2015-10-19 NOTE — Telephone Encounter (Signed)
[facility] called and order corrected to say Metformin 1000 mg BID.

## 2015-10-19 NOTE — Telephone Encounter (Signed)
Med list corrected to say 1000 mg Metformin BID.  Niece called back and had Aunt on conference call.  Both very confused.  Pt is going to bring her medications here at 2 PM today and we will go over face to face.

## 2015-10-19 NOTE — Telephone Encounter (Signed)
Pt was on Metformin 1000 mg BID.  Last refill says take 1/2 tablet (500 mg) BID.  Is this correct???

## 2015-10-19 NOTE — Telephone Encounter (Signed)
I did not change her meds, so it should be 1 tab BID,

## 2015-11-08 DIAGNOSIS — L89892 Pressure ulcer of other site, stage 2: Secondary | ICD-10-CM | POA: Diagnosis not present

## 2015-11-08 DIAGNOSIS — E1140 Type 2 diabetes mellitus with diabetic neuropathy, unspecified: Secondary | ICD-10-CM | POA: Diagnosis not present

## 2015-11-26 ENCOUNTER — Other Ambulatory Visit: Payer: Self-pay | Admitting: Family Medicine

## 2015-11-26 NOTE — Telephone Encounter (Signed)
Test strips refills

## 2015-12-14 ENCOUNTER — Telehealth: Payer: Self-pay | Admitting: *Deleted

## 2015-12-14 MED ORDER — ALBUTEROL SULFATE HFA 108 (90 BASE) MCG/ACT IN AERS: 2.0000 | INHALATION_SPRAY | Freq: Four times a day (QID) | RESPIRATORY_TRACT | Status: DC | PRN

## 2015-12-14 NOTE — Telephone Encounter (Signed)
[facility] from [facility].   Advised that Proventil will no longer be covered; instead ProAir will be covered.   MD made aware and approved interchange.   Prescription sent to pharmacy.

## 2015-12-23 ENCOUNTER — Telehealth: Payer: Self-pay | Admitting: *Deleted

## 2015-12-23 MED ORDER — ALBUTEROL SULFATE HFA 108 (90 BASE) MCG/ACT IN AERS: 2.0000 | INHALATION_SPRAY | Freq: Four times a day (QID) | RESPIRATORY_TRACT | Status: DC | PRN

## 2015-12-23 NOTE — Telephone Encounter (Signed)
Received fax from insurance regarding the prescription for Proventil.   Insurance will not cover this medication.   MD made aware and recommendations are as follows:  D/C Proventil.  Begin ProAir.    Prescription sent to pharmacy.

## 2016-01-03 DIAGNOSIS — E1151 Type 2 diabetes mellitus with diabetic peripheral angiopathy without gangrene: Secondary | ICD-10-CM | POA: Diagnosis not present

## 2016-01-03 DIAGNOSIS — E1140 Type 2 diabetes mellitus with diabetic neuropathy, unspecified: Secondary | ICD-10-CM | POA: Diagnosis not present

## 2016-01-04 DIAGNOSIS — N8111 Cystocele, midline: Secondary | ICD-10-CM | POA: Diagnosis not present

## 2016-02-01 ENCOUNTER — Ambulatory Visit (INDEPENDENT_AMBULATORY_CARE_PROVIDER_SITE_OTHER): Payer: Medicare Other | Admitting: Family Medicine

## 2016-02-01 ENCOUNTER — Encounter: Payer: Self-pay | Admitting: Family Medicine

## 2016-02-01 VITALS — BP 110/68 | HR 78 | Temp 98.7°F | Resp 14 | Ht 63.0 in | Wt 132.0 lb

## 2016-02-01 DIAGNOSIS — G990 Autonomic neuropathy in diseases classified elsewhere: Secondary | ICD-10-CM | POA: Diagnosis not present

## 2016-02-01 DIAGNOSIS — E785 Hyperlipidemia, unspecified: Secondary | ICD-10-CM

## 2016-02-01 DIAGNOSIS — I10 Essential (primary) hypertension: Secondary | ICD-10-CM

## 2016-02-01 DIAGNOSIS — E1159 Type 2 diabetes mellitus with other circulatory complications: Secondary | ICD-10-CM | POA: Diagnosis not present

## 2016-02-01 DIAGNOSIS — E1143 Type 2 diabetes mellitus with diabetic autonomic (poly)neuropathy: Secondary | ICD-10-CM | POA: Diagnosis not present

## 2016-02-01 DIAGNOSIS — I152 Hypertension secondary to endocrine disorders: Secondary | ICD-10-CM

## 2016-02-01 LAB — CBC WITH DIFFERENTIAL/PLATELET
BASOS ABS: 0 10*3/uL (ref 0.0–0.1)
Basophils Relative: 1 % (ref 0–1)
Eosinophils Absolute: 0.1 10*3/uL (ref 0.0–0.7)
Eosinophils Relative: 3 % (ref 0–5)
HEMATOCRIT: 36 % (ref 36.0–46.0)
HEMOGLOBIN: 11.7 g/dL — AB (ref 12.0–15.0)
LYMPHS ABS: 1.2 10*3/uL (ref 0.7–4.0)
LYMPHS PCT: 34 % (ref 12–46)
MCH: 28 pg (ref 26.0–34.0)
MCHC: 32.5 g/dL (ref 30.0–36.0)
MCV: 86.1 fL (ref 78.0–100.0)
MPV: 9.8 fL (ref 8.6–12.4)
Monocytes Absolute: 0.3 10*3/uL (ref 0.1–1.0)
Monocytes Relative: 9 % (ref 3–12)
NEUTROS ABS: 1.9 10*3/uL (ref 1.7–7.7)
Neutrophils Relative %: 53 % (ref 43–77)
Platelets: 293 10*3/uL (ref 150–400)
RBC: 4.18 MIL/uL (ref 3.87–5.11)
RDW: 14.5 % (ref 11.5–15.5)
WBC: 3.6 10*3/uL — ABNORMAL LOW (ref 4.0–10.5)

## 2016-02-01 LAB — BASIC METABOLIC PANEL
BUN: 12 mg/dL (ref 7–25)
CO2: 26 mmol/L (ref 20–31)
Calcium: 9.6 mg/dL (ref 8.6–10.4)
Chloride: 102 mmol/L (ref 98–110)
Creat: 1.05 mg/dL — ABNORMAL HIGH (ref 0.60–0.88)
GLUCOSE: 73 mg/dL (ref 70–99)
POTASSIUM: 3.8 mmol/L (ref 3.5–5.3)
Sodium: 137 mmol/L (ref 135–146)

## 2016-02-01 LAB — HEMOGLOBIN A1C
Hgb A1c MFr Bld: 6.6 % — ABNORMAL HIGH (ref <5.7)
MEAN PLASMA GLUCOSE: 143 mg/dL — AB (ref <117)

## 2016-02-01 NOTE — Assessment & Plan Note (Signed)
Well controlled, no changes 

## 2016-02-01 NOTE — Progress Notes (Signed)
Patient ID: Theresa Howe, female   DOB: 1931/03/28, 82 y.o.   MRN: [id number]   Subjective:    Patient ID: Theresa Howe, female    DOB: Dec 20, 1930, 82 y.o.   MRN: [id number]  Patient presents for 4 month F/U  DM- last A1C 6.5% , her blood sugars are less than 120 fasting no hypoglycemia she is taking her medicine as prescribed LDL at goal  she is following with the foot doctor   He is taking all her medicines as described she has no new concerns today she did get her flu shot at the pharmacy    Review Of Systems:  GEN- denies fatigue, fever, weight loss,weakness, recent illness HEENT- denies eye drainage, change in vision, nasal discharge, CVS- denies chest pain, palpitations RESP- denies SOB, cough, wheeze ABD- denies N/V, change in stools, abd pain GU- denies dysuria, hematuria, dribbling, incontinence MSK- denies joint pain, muscle aches, injury Neuro- denies headache, dizziness, syncope, seizure activity       Objective:    BP 110/68 mmHg  Pulse 78  Temp(Src) 98.7 F (37.1 C) (Oral)  Resp 14  Ht 5\' 3"  (1.6 m)  Wt 132 lb (59.875 kg)  BMI 23.39 kg/m2 GEN- NAD, alert and oriented x3 HEENT- PERRL, EOMI, non injected sclera, pink conjunctiva, MMM, oropharynx clear CVS- RRR, no murmur RESP-CTAB ABD-NABS,soft,NT,ND EXT- No edema Pulses- Radial  2+        Assessment & Plan:      Problem List Items Addressed This Visit    Hypertension associated with diabetes ([place])    Well controlled, no changes      Hyperlipidemia LDL goal <70   Diabetic neuropathy (HCC)   Diabetes with neurologic complications ([place]) - Primary    Well controlled, continue meds Check A1C Lipids at goal on statin and ASA, ACEI      Relevant Orders   CBC with Differential/Platelet   Basic metabolic panel   Hemoglobin A1c      Note: This dictation was prepared with Dragon dictation along with smaller phrase technology. Any transcriptional errors that result from this process are  unintentional.

## 2016-02-01 NOTE — Assessment & Plan Note (Signed)
Well controlled, continue meds Check A1C Lipids at goal on statin and ASA, ACEI

## 2016-02-01 NOTE — Patient Instructions (Signed)
Dr. Berline [phone] We will call with lab results F/U 4 months

## 2016-02-03 ENCOUNTER — Encounter: Payer: Self-pay | Admitting: *Deleted

## 2016-02-22 DIAGNOSIS — H2512 Age-related nuclear cataract, left eye: Secondary | ICD-10-CM | POA: Diagnosis not present

## 2016-02-22 DIAGNOSIS — H4050X0 Glaucoma secondary to other eye disorders, unspecified eye, stage unspecified: Secondary | ICD-10-CM | POA: Diagnosis not present

## 2016-02-22 DIAGNOSIS — H538 Other visual disturbances: Secondary | ICD-10-CM | POA: Diagnosis not present

## 2016-02-23 DIAGNOSIS — N8111 Cystocele, midline: Secondary | ICD-10-CM | POA: Diagnosis not present

## 2016-03-14 DIAGNOSIS — E1151 Type 2 diabetes mellitus with diabetic peripheral angiopathy without gangrene: Secondary | ICD-10-CM | POA: Diagnosis not present

## 2016-03-14 DIAGNOSIS — E1140 Type 2 diabetes mellitus with diabetic neuropathy, unspecified: Secondary | ICD-10-CM | POA: Diagnosis not present

## 2016-04-12 ENCOUNTER — Ambulatory Visit (INDEPENDENT_AMBULATORY_CARE_PROVIDER_SITE_OTHER): Payer: Medicare Other | Admitting: Family Medicine

## 2016-04-12 ENCOUNTER — Encounter: Payer: Self-pay | Admitting: Family Medicine

## 2016-04-12 ENCOUNTER — Other Ambulatory Visit: Payer: Self-pay | Admitting: Family Medicine

## 2016-04-12 VITALS — BP 128/64 | HR 72 | Temp 97.9°F | Resp 18 | Ht 63.0 in | Wt 130.0 lb

## 2016-04-12 DIAGNOSIS — W1809XA Striking against other object with subsequent fall, initial encounter: Secondary | ICD-10-CM

## 2016-04-12 DIAGNOSIS — M25551 Pain in right hip: Secondary | ICD-10-CM

## 2016-04-12 NOTE — Patient Instructions (Signed)
Okay to use tylenol F/U as previous

## 2016-04-12 NOTE — Telephone Encounter (Signed)
Refill appropriate and filled per protocol. 

## 2016-04-13 ENCOUNTER — Encounter: Payer: Self-pay | Admitting: Family Medicine

## 2016-04-13 NOTE — Assessment & Plan Note (Signed)
Based on exam deep bruising, doubt fracture, symptoms pretty much resolved Discussed xray but will hold off at this time She wanted more reassurance today

## 2016-04-13 NOTE — Progress Notes (Signed)
Patient ID: Theresa Howe, female   DOB: 12/23/1930, 82 y.o.   MRN: [id number]    Subjective:    Patient ID: Theresa Howe, female    DOB: 07/19/1931, 82 y.o.   MRN: [id number]  Patient presents for R Hip Pain   Pt here with right hip pain. She fell 2-3 weeks ago at [employer], she was picking up a heavy case of water and shifted her weight falling into the display. She did not hit the ground, but had pain in hip, top of ilium , no bruisng seen, no swelling She took tylenol. Now minimal pain, able to do her regular activites, no pain with walking sitting or climbing stairs    Review Of Systems:  GEN- denies fatigue, fever, weight loss,weakness, recent illness HEENT- denies eye drainage, change in vision, nasal discharge, CVS- denies chest pain, palpitations RESP- denies SOB, cough, wheeze ABD- denies N/V, change in stools, abd pain GU- denies dysuria, hematuria, dribbling, incontinence MSK-+joint pain, muscle aches, injury Neuro- denies headache, dizziness, syncope, seizure activity       Objective:    BP 128/64 mmHg  Pulse 72  Temp(Src) 97.9 F (36.6 C) (Oral)  Resp 18  Ht 5\' 3"  (1.6 m)  Wt 130 lb (58.968 kg)  BMI 23.03 kg/m2 GEN- NAD, alert and oriented x3 CVS- RRR, no murmur RESP-CTAB ABD-NABS,soft,NT,ND MSK- spine NT, fair ROM , Hips, NT, neg hip rock, fair ROM, no restricted movement, knees no effusion,normal appearance fair ROM  Skin in tact  EXT- No edema Pulses- Radial 2+        Assessment & Plan:      Problem List Items Addressed This Visit    Hip pain - Primary    Based on exam deep bruising, doubt fracture, symptoms pretty much resolved Discussed xray but will hold off at this time She wanted more reassurance today       Other Visit Diagnoses    Fall against object, initial encounter           Note: This dictation was prepared with Dragon dictation along with smaller phrase technology. Any transcriptional errors that result from this process  are unintentional.

## 2016-05-05 ENCOUNTER — Other Ambulatory Visit: Payer: Self-pay | Admitting: Family Medicine

## 2016-05-05 NOTE — Telephone Encounter (Signed)
Refill appropriate and filled per protocol. 

## 2016-05-26 DIAGNOSIS — N993 Prolapse of vaginal vault after hysterectomy: Secondary | ICD-10-CM | POA: Diagnosis not present

## 2016-05-28 ENCOUNTER — Other Ambulatory Visit: Payer: Self-pay | Admitting: Family Medicine

## 2016-05-29 NOTE — Telephone Encounter (Signed)
Refill appropriate and filled per protocol. 

## 2016-05-30 DIAGNOSIS — E1151 Type 2 diabetes mellitus with diabetic peripheral angiopathy without gangrene: Secondary | ICD-10-CM | POA: Diagnosis not present

## 2016-05-30 DIAGNOSIS — E1140 Type 2 diabetes mellitus with diabetic neuropathy, unspecified: Secondary | ICD-10-CM | POA: Diagnosis not present

## 2016-06-02 ENCOUNTER — Ambulatory Visit (INDEPENDENT_AMBULATORY_CARE_PROVIDER_SITE_OTHER): Payer: Medicare Other | Admitting: Family Medicine

## 2016-06-02 ENCOUNTER — Encounter: Payer: Self-pay | Admitting: Family Medicine

## 2016-06-02 VITALS — BP 122/64 | HR 76 | Temp 97.5°F | Resp 18 | Wt 125.0 lb

## 2016-06-02 DIAGNOSIS — I152 Hypertension secondary to endocrine disorders: Secondary | ICD-10-CM

## 2016-06-02 DIAGNOSIS — I10 Essential (primary) hypertension: Secondary | ICD-10-CM

## 2016-06-02 DIAGNOSIS — E785 Hyperlipidemia, unspecified: Secondary | ICD-10-CM

## 2016-06-02 DIAGNOSIS — E1143 Type 2 diabetes mellitus with diabetic autonomic (poly)neuropathy: Secondary | ICD-10-CM

## 2016-06-02 DIAGNOSIS — E1159 Type 2 diabetes mellitus with other circulatory complications: Secondary | ICD-10-CM | POA: Diagnosis not present

## 2016-06-02 DIAGNOSIS — G990 Autonomic neuropathy in diseases classified elsewhere: Secondary | ICD-10-CM | POA: Diagnosis not present

## 2016-06-02 LAB — LIPID PANEL
CHOLESTEROL: 178 mg/dL (ref 125–200)
HDL: 73 mg/dL (ref >=46)
LDL Cholesterol: 82 mg/dL (ref <130)
TRIGLYCERIDES: 113 mg/dL (ref <150)
Total CHOL/HDL Ratio: 2.4 Ratio (ref <=5.0)
VLDL: 23 mg/dL (ref <30)

## 2016-06-02 LAB — CBC WITH DIFFERENTIAL/PLATELET
BASOS ABS: 37 {cells}/uL (ref 0–200)
Basophils Relative: 1 %
EOS ABS: 111 {cells}/uL (ref 15–500)
Eosinophils Relative: 3 %
HEMATOCRIT: 35.2 % (ref 35.0–45.0)
HEMOGLOBIN: 11.4 g/dL — AB (ref 12.0–15.0)
LYMPHS ABS: 1258 {cells}/uL (ref 850–3900)
Lymphocytes Relative: 34 %
MCH: 28.2 pg (ref 27.0–33.0)
MCHC: 32.4 g/dL (ref 32.0–36.0)
MCV: 87.1 fL (ref 80.0–100.0)
MPV: 10 fL (ref 7.5–12.5)
Monocytes Absolute: 259 cells/uL (ref 200–950)
Monocytes Relative: 7 %
NEUTROS ABS: 2035 {cells}/uL (ref 1500–7800)
NEUTROS PCT: 55 %
Platelets: 348 10*3/uL (ref 140–400)
RBC: 4.04 MIL/uL (ref 3.80–5.10)
RDW: 14.4 % (ref 11.0–15.0)
WBC: 3.7 10*3/uL — ABNORMAL LOW (ref 3.8–10.8)

## 2016-06-02 LAB — COMPREHENSIVE METABOLIC PANEL
ALBUMIN: 4.1 g/dL (ref 3.6–5.1)
ALK PHOS: 53 U/L (ref 33–130)
ALT: 11 U/L (ref 6–29)
AST: 19 U/L (ref 10–35)
BILIRUBIN TOTAL: 0.5 mg/dL (ref 0.2–1.2)
BUN: 21 mg/dL (ref 7–25)
CO2: 25 mmol/L (ref 20–31)
CREATININE: 1.05 mg/dL — AB (ref 0.60–0.88)
Calcium: 9.5 mg/dL (ref 8.6–10.4)
Chloride: 101 mmol/L (ref 98–110)
Glucose, Bld: 83 mg/dL (ref 70–99)
Potassium: 3.9 mmol/L (ref 3.5–5.3)
SODIUM: 139 mmol/L (ref 135–146)
TOTAL PROTEIN: 7 g/dL (ref 6.1–8.1)

## 2016-06-02 MED ORDER — VALSARTAN-HYDROCHLOROTHIAZIDE 160-12.5 MG PO TABS
1.0000 | ORAL_TABLET | Freq: Every day | ORAL | Status: DC
Start: 2016-06-02 — End: 2016-12-06

## 2016-06-02 MED ORDER — ALENDRONATE SODIUM 70 MG PO TABS: 70.0000 mg | ORAL_TABLET | ORAL | Status: DC

## 2016-06-02 MED ORDER — DORZOLAMIDE HCL 2 % OP SOLN: 1.0000 [drp] | Freq: Two times a day (BID) | OPHTHALMIC | Status: DC

## 2016-06-02 MED ORDER — GLIMEPIRIDE 2 MG PO TABS: 2.0000 mg | ORAL_TABLET | Freq: Every day | ORAL | Status: DC

## 2016-06-02 MED ORDER — LATANOPROST 0.005 % OP SOLN: 1.0000 [drp] | Freq: Every day | OPHTHALMIC | Status: DC

## 2016-06-02 MED ORDER — METFORMIN HCL 1000 MG PO TABS: 1000.0000 mg | ORAL_TABLET | Freq: Two times a day (BID) | ORAL | Status: DC

## 2016-06-02 MED ORDER — ESCITALOPRAM OXALATE 10 MG PO TABS: 10.0000 mg | ORAL_TABLET | Freq: Every day | ORAL | Status: DC

## 2016-06-02 MED ORDER — GABAPENTIN 100 MG PO CAPS: ORAL_CAPSULE | ORAL | Status: DC

## 2016-06-02 MED ORDER — SIMVASTATIN 40 MG PO TABS: ORAL_TABLET | ORAL | Status: DC

## 2016-06-02 NOTE — Patient Instructions (Signed)
Continue current medications We will call with lab results  F/U 4 months  

## 2016-06-02 NOTE — Progress Notes (Signed)
Patient ID: Theresa Howe, female   DOB: 27-Dec-1930, 82 y.o.   MRN: [id number]   Subjective:    Patient ID: Theresa Howe, female    DOB: 1931-05-10, 81 y.o.   MRN: [id number]  Patient presents for 4 mth check up Patient to follow chronic medical problems. States that in general she's been doing well. Her pains in her back and her hip is now resolved. She is back to her regular activities.  Diabetes mellitus states her blood sugar has been fluctuating some G did have a couple times when he got up to 200 but has been mostly less than 130 fasting. She is taking her metformin and her Amaryl as described. No hypoglycemic symptoms.  Medications reviewed  Review Of Systems:  GEN- denies fatigue, fever, weight loss,weakness, recent illness HEENT- denies eye drainage, change in vision, nasal discharge, CVS- denies chest pain, palpitations RESP- denies SOB, cough, wheeze ABD- denies N/V, change in stools, abd pain GU- denies dysuria, hematuria, dribbling, incontinence MSK- denies joint pain, muscle aches, injury Neuro- denies headache, dizziness, syncope, seizure activity       Objective:    BP 122/64 mmHg  Pulse 76  Temp(Src) 97.5 F (36.4 C) (Oral)  Resp 18  Wt 125 lb (56.7 kg) GEN- NAD, alert and oriented x3 HEENT- PERRL, EOMI, non injected sclera, pink conjunctiva, MMM, oropharynx clear Neck- Supple, no thyromegaly CVS- RRR, no murmur RESP-CTAB ABD-NABS,soft,NT,ND EXT- No edema Pulses- Radial, DP- 2+         Assessment & Plan:      Problem List Items Addressed This Visit    Hypertension associated with diabetes ([place]) - Primary    Blood pressure is well-controlled. She is also on statin drug. I did note that her weight was down a few pounds she states now she is eating 3 meals a day also uses some boost      Relevant Medications   simvastatin (ZOCOR) 40 MG tablet   metFORMIN (GLUCOPHAGE) 1000 MG tablet   glimepiride (AMARYL) 2 MG tablet   valsartan-hydrochlorothiazide (DIOVAN-HCT) 160-12.5 MG tablet   Other Relevant Orders   CBC with Differential/Platelet   Comprehensive metabolic panel   Hyperlipidemia LDL goal <70   Relevant Medications   simvastatin (ZOCOR) 40 MG tablet   valsartan-hydrochlorothiazide (DIOVAN-HCT) 160-12.5 MG tablet   Other Relevant Orders   Lipid panel   Diabetic neuropathy (HCC)   Relevant Medications   simvastatin (ZOCOR) 40 MG tablet   metFORMIN (GLUCOPHAGE) 1000 MG tablet   glimepiride (AMARYL) 2 MG tablet   valsartan-hydrochlorothiazide (DIOVAN-HCT) 160-12.5 MG tablet   Other Relevant Orders   Hemoglobin A1c   Diabetes with neurologic complications ([place])    Diabetes has been well-controlled last A1c is 6.6% recheck her labs today: As around 7% when she starts having hypoglycemic and will allow her A1c go up a little more      Relevant Medications   simvastatin (ZOCOR) 40 MG tablet   metFORMIN (GLUCOPHAGE) 1000 MG tablet   glimepiride (AMARYL) 2 MG tablet   valsartan-hydrochlorothiazide (DIOVAN-HCT) 160-12.5 MG tablet   Other Relevant Orders   Hemoglobin A1c      Note: This dictation was prepared with Dragon dictation along with smaller phrase technology. Any transcriptional errors that result from this process are unintentional.

## 2016-06-02 NOTE — Assessment & Plan Note (Signed)
Diabetes has been well-controlled last A1c is 6.6% recheck her labs today: As around 7% when she starts having hypoglycemic and will allow her A1c go up a little more

## 2016-06-02 NOTE — Assessment & Plan Note (Signed)
Blood pressure is well-controlled. She is also on statin drug. I did note that her weight was down a few pounds she states now she is eating 3 meals a day also uses some boost

## 2016-06-03 LAB — HEMOGLOBIN A1C
Hgb A1c MFr Bld: 6.5 % — ABNORMAL HIGH (ref <5.7)
MEAN PLASMA GLUCOSE: 140 mg/dL

## 2016-06-06 ENCOUNTER — Encounter: Payer: Self-pay | Admitting: *Deleted

## 2016-06-11 ENCOUNTER — Other Ambulatory Visit: Payer: Self-pay | Admitting: Physician Assistant

## 2016-06-12 NOTE — Telephone Encounter (Signed)
Medication refilled per protocol. 

## 2016-06-14 ENCOUNTER — Other Ambulatory Visit: Payer: Self-pay | Admitting: Family Medicine

## 2016-06-26 DIAGNOSIS — Z1231 Encounter for screening mammogram for malignant neoplasm of breast: Secondary | ICD-10-CM | POA: Diagnosis not present

## 2016-07-11 ENCOUNTER — Other Ambulatory Visit: Payer: Self-pay | Admitting: *Deleted

## 2016-07-11 MED ORDER — METFORMIN HCL 1000 MG PO TABS: 1000.0000 mg | ORAL_TABLET | Freq: Two times a day (BID) | ORAL | 2 refills | Status: DC

## 2016-07-11 NOTE — Telephone Encounter (Signed)
Received fax requesting refill on MTF.   Refill appropriate and filled per protocol. 

## 2016-07-14 ENCOUNTER — Other Ambulatory Visit: Payer: Self-pay | Admitting: *Deleted

## 2016-07-14 MED ORDER — METFORMIN HCL 1000 MG PO TABS: 1000.0000 mg | ORAL_TABLET | Freq: Two times a day (BID) | ORAL | 2 refills | Status: DC

## 2016-07-14 NOTE — Telephone Encounter (Signed)
Received fax requesting refill on MTF.   Refill appropriate and filled per protocol. 

## 2016-08-16 DIAGNOSIS — E1151 Type 2 diabetes mellitus with diabetic peripheral angiopathy without gangrene: Secondary | ICD-10-CM | POA: Diagnosis not present

## 2016-08-16 DIAGNOSIS — E1140 Type 2 diabetes mellitus with diabetic neuropathy, unspecified: Secondary | ICD-10-CM | POA: Diagnosis not present

## 2016-08-22 ENCOUNTER — Other Ambulatory Visit: Payer: Self-pay | Admitting: Family Medicine

## 2016-08-22 DIAGNOSIS — H2512 Age-related nuclear cataract, left eye: Secondary | ICD-10-CM | POA: Diagnosis not present

## 2016-08-22 DIAGNOSIS — H538 Other visual disturbances: Secondary | ICD-10-CM | POA: Diagnosis not present

## 2016-08-22 DIAGNOSIS — Z961 Presence of intraocular lens: Secondary | ICD-10-CM | POA: Diagnosis not present

## 2016-08-22 DIAGNOSIS — H4050X0 Glaucoma secondary to other eye disorders, unspecified eye, stage unspecified: Secondary | ICD-10-CM | POA: Diagnosis not present

## 2016-08-25 DIAGNOSIS — N8111 Cystocele, midline: Secondary | ICD-10-CM | POA: Diagnosis not present

## 2016-10-03 ENCOUNTER — Ambulatory Visit (INDEPENDENT_AMBULATORY_CARE_PROVIDER_SITE_OTHER): Payer: Medicare Other | Admitting: Family Medicine

## 2016-10-03 ENCOUNTER — Encounter: Payer: Self-pay | Admitting: Family Medicine

## 2016-10-03 VITALS — BP 120/62 | HR 82 | Temp 98.4°F | Resp 14 | Ht 63.0 in | Wt 123.0 lb

## 2016-10-03 DIAGNOSIS — Z23 Encounter for immunization: Secondary | ICD-10-CM | POA: Diagnosis not present

## 2016-10-03 DIAGNOSIS — I152 Hypertension secondary to endocrine disorders: Secondary | ICD-10-CM

## 2016-10-03 DIAGNOSIS — E1159 Type 2 diabetes mellitus with other circulatory complications: Secondary | ICD-10-CM | POA: Diagnosis not present

## 2016-10-03 DIAGNOSIS — E785 Hyperlipidemia, unspecified: Secondary | ICD-10-CM

## 2016-10-03 DIAGNOSIS — I10 Essential (primary) hypertension: Secondary | ICD-10-CM

## 2016-10-03 DIAGNOSIS — E1143 Type 2 diabetes mellitus with diabetic autonomic (poly)neuropathy: Secondary | ICD-10-CM

## 2016-10-03 LAB — CBC WITH DIFFERENTIAL/PLATELET
BASOS ABS: 33 {cells}/uL (ref 0–200)
Basophils Relative: 1 %
EOS PCT: 4 %
Eosinophils Absolute: 132 cells/uL (ref 15–500)
HCT: 35.1 % (ref 35.0–45.0)
HEMOGLOBIN: 11.2 g/dL — AB (ref 12.0–15.0)
Lymphocytes Relative: 32 %
Lymphs Abs: 1056 cells/uL (ref 850–3900)
MCH: 27.3 pg (ref 27.0–33.0)
MCHC: 31.9 g/dL — AB (ref 32.0–36.0)
MCV: 85.6 fL (ref 80.0–100.0)
MONOS PCT: 9 %
MPV: 9.8 fL (ref 7.5–12.5)
Monocytes Absolute: 297 cells/uL (ref 200–950)
NEUTROS PCT: 54 %
Neutro Abs: 1782 cells/uL (ref 1500–7800)
PLATELETS: 306 10*3/uL (ref 140–400)
RBC: 4.1 MIL/uL (ref 3.80–5.10)
RDW: 15.3 % — ABNORMAL HIGH (ref 11.0–15.0)
WBC: 3.3 10*3/uL — ABNORMAL LOW (ref 3.8–10.8)

## 2016-10-03 LAB — HEMOGLOBIN A1C
HEMOGLOBIN A1C: 6 % — AB (ref <5.7)
Mean Plasma Glucose: 126 mg/dL

## 2016-10-03 NOTE — Assessment & Plan Note (Addendum)
Continue with Zocor her LDL is at goal of 82, approx 4 months ago

## 2016-10-03 NOTE — Progress Notes (Signed)
Subjective:    Patient ID: Theresa Howe, female    DOB: October 06, 1931, 82 y.o.   MRN: [id number]  Patient presents for 4 month F/U (is fasting)  Patient to follow-up chronic medical problems. She is no particular concerns today. Diabetes mellitus she is taking her metformin and Amaryl as prescribed. Her last A1c was 6.5% she denies any hypoglycemia symptoms. She did not bring her meter with her today. She is on statin drug and ARB.  She is still followed by her ophthalmologist.   Review Of Systems:  GEN- denies fatigue, fever, weight loss,weakness, recent illness HEENT- denies eye drainage, change in vision, nasal discharge, CVS- denies chest pain, palpitations RESP- denies SOB, cough, wheeze ABD- denies N/V, change in stools, abd pain GU- denies dysuria, hematuria, dribbling, incontinence MSK- denies joint pain, muscle aches, injury Neuro- denies headache, dizziness, syncope, seizure activity       Objective:    BP 120/62 (BP Location: Left Arm, Patient Position: Sitting, Cuff Size: Normal)   Pulse 82   Temp 98.4 F (36.9 C) (Oral)   Resp 14   Ht 5\' 3"  (1.6 m)   Wt 123 lb (55.8 kg)   BMI 21.79 kg/m  GEN- NAD, alert and oriented x3 HEENT- PERRL, EOMI, non injected sclera, pink conjunctiva, MMM, oropharynx clear Neck- Supple, no thyromegaly CVS- RRR, no murmur RESP-CTAB ABD-NABS,soft,NT,ND EXT- No edema Pulses- Radial, DP- 2+        Assessment & Plan:      Problem List Items Addressed This Visit    Hypertension associated with diabetes (HCC) - Primary    Blood pressure well controlled to change her medication check electrolytes      Hyperlipidemia LDL goal <70    Continue with Zocor her LDL is at goal of 82, approx 4 months ago      Diabetic neuropathy (HCC)   Diabetes with neurologic complications (HCC)    Diabetes well controlled she does have underlying neuropathy that is also controlled at this time. No change to her current regimen continue to  monitor her renal function with the metformin      Relevant Orders   CBC with Differential/Platelet   Hemoglobin A1c   HM DIABETES FOOT EXAM    Other Visit Diagnoses    Need for prophylactic vaccination and inoculation against influenza       Relevant Orders   Flu Vaccine QUAD 36+ mos PF IM (Fluarix & Fluzone Quad PF) (Completed)      Note: This dictation was prepared with Dragon dictation along with smaller phrase technology. Any transcriptional errors that result from this process are unintentional.

## 2016-10-03 NOTE — Assessment & Plan Note (Signed)
Blood pressure well controlled to change her medication check electrolytes

## 2016-10-03 NOTE — Assessment & Plan Note (Signed)
Diabetes well controlled she does have underlying neuropathy that is also controlled at this time. No change to her current regimen continue to monitor her renal function with the metformin

## 2016-10-03 NOTE — Patient Instructions (Signed)
F/U 4 months We will call with lab results Flu shot

## 2016-10-09 ENCOUNTER — Other Ambulatory Visit: Payer: Self-pay | Admitting: *Deleted

## 2016-10-09 MED ORDER — METFORMIN HCL 1000 MG PO TABS: 500.0000 mg | ORAL_TABLET | Freq: Two times a day (BID) | ORAL | 2 refills | Status: DC

## 2016-10-09 NOTE — Telephone Encounter (Signed)
Received fax requesting refill on MTF.   Refill appropriate and filled per protocol. 

## 2016-10-31 DIAGNOSIS — E1140 Type 2 diabetes mellitus with diabetic neuropathy, unspecified: Secondary | ICD-10-CM | POA: Diagnosis not present

## 2016-10-31 DIAGNOSIS — E1151 Type 2 diabetes mellitus with diabetic peripheral angiopathy without gangrene: Secondary | ICD-10-CM | POA: Diagnosis not present

## 2016-11-21 DIAGNOSIS — N8111 Cystocele, midline: Secondary | ICD-10-CM | POA: Diagnosis not present

## 2016-12-06 ENCOUNTER — Other Ambulatory Visit: Payer: Self-pay | Admitting: Family Medicine

## 2016-12-26 ENCOUNTER — Other Ambulatory Visit: Payer: Self-pay | Admitting: *Deleted

## 2016-12-26 MED ORDER — METFORMIN HCL 1000 MG PO TABS: 500.0000 mg | ORAL_TABLET | Freq: Two times a day (BID) | ORAL | 2 refills | Status: DC

## 2016-12-26 MED ORDER — VALSARTAN-HYDROCHLOROTHIAZIDE 160-12.5 MG PO TABS: 1.0000 | ORAL_TABLET | Freq: Every day | ORAL | 2 refills | Status: DC

## 2016-12-26 MED ORDER — GLIMEPIRIDE 2 MG PO TABS: 2.0000 mg | ORAL_TABLET | Freq: Every day | ORAL | 1 refills | Status: DC

## 2016-12-26 MED ORDER — GABAPENTIN 100 MG PO CAPS: ORAL_CAPSULE | ORAL | 2 refills | Status: DC

## 2016-12-26 MED ORDER — ALENDRONATE SODIUM 70 MG PO TABS: ORAL_TABLET | ORAL | 2 refills | Status: DC

## 2016-12-26 MED ORDER — SIMVASTATIN 40 MG PO TABS: ORAL_TABLET | ORAL | 1 refills | Status: DC

## 2016-12-26 MED ORDER — ESCITALOPRAM OXALATE 10 MG PO TABS: 10.0000 mg | ORAL_TABLET | Freq: Every day | ORAL | 2 refills | Status: DC

## 2016-12-26 MED ORDER — FLUTICASONE PROPIONATE 50 MCG/ACT NA SUSP: 2.0000 | Freq: Every day | NASAL | 2 refills | Status: DC

## 2016-12-26 NOTE — Telephone Encounter (Signed)
Received letter from patient insurance.   Requested refills on routine medications.   Refill appropriate and filled per protocol.

## 2017-01-16 DIAGNOSIS — E1140 Type 2 diabetes mellitus with diabetic neuropathy, unspecified: Secondary | ICD-10-CM | POA: Diagnosis not present

## 2017-01-16 DIAGNOSIS — E1151 Type 2 diabetes mellitus with diabetic peripheral angiopathy without gangrene: Secondary | ICD-10-CM | POA: Diagnosis not present

## 2017-02-06 ENCOUNTER — Ambulatory Visit (INDEPENDENT_AMBULATORY_CARE_PROVIDER_SITE_OTHER): Payer: Medicare Other | Admitting: Family Medicine

## 2017-02-06 ENCOUNTER — Encounter: Payer: Self-pay | Admitting: Family Medicine

## 2017-02-06 DIAGNOSIS — I10 Essential (primary) hypertension: Secondary | ICD-10-CM

## 2017-02-06 DIAGNOSIS — E1159 Type 2 diabetes mellitus with other circulatory complications: Secondary | ICD-10-CM | POA: Diagnosis not present

## 2017-02-06 DIAGNOSIS — E785 Hyperlipidemia, unspecified: Secondary | ICD-10-CM

## 2017-02-06 DIAGNOSIS — I152 Hypertension secondary to endocrine disorders: Secondary | ICD-10-CM

## 2017-02-06 DIAGNOSIS — E1143 Type 2 diabetes mellitus with diabetic autonomic (poly)neuropathy: Secondary | ICD-10-CM | POA: Diagnosis not present

## 2017-02-06 LAB — CBC WITH DIFFERENTIAL/PLATELET
Basophils Absolute: 29 cells/uL (ref 0–200)
Basophils Relative: 1 %
Eosinophils Absolute: 58 cells/uL (ref 15–500)
Eosinophils Relative: 2 %
HEMATOCRIT: 36.6 % (ref 35.0–45.0)
Hemoglobin: 11.8 g/dL — ABNORMAL LOW (ref 12.0–15.0)
LYMPHS PCT: 39 %
Lymphs Abs: 1131 cells/uL (ref 850–3900)
MCH: 28.3 pg (ref 27.0–33.0)
MCHC: 32.2 g/dL (ref 32.0–36.0)
MCV: 87.8 fL (ref 80.0–100.0)
MONO ABS: 261 {cells}/uL (ref 200–950)
MPV: 10.1 fL (ref 7.5–12.5)
Monocytes Relative: 9 %
NEUTROS PCT: 49 %
Neutro Abs: 1421 cells/uL — ABNORMAL LOW (ref 1500–7800)
Platelets: 294 10*3/uL (ref 140–400)
RBC: 4.17 MIL/uL (ref 3.80–5.10)
RDW: 15.1 % — AB (ref 11.0–15.0)
WBC: 2.9 10*3/uL — ABNORMAL LOW (ref 3.8–10.8)

## 2017-02-06 LAB — COMPREHENSIVE METABOLIC PANEL
ALK PHOS: 52 U/L (ref 33–130)
ALT: 14 U/L (ref 6–29)
AST: 30 U/L (ref 10–35)
Albumin: 4 g/dL (ref 3.6–5.1)
BUN: 11 mg/dL (ref 7–25)
CHLORIDE: 106 mmol/L (ref 98–110)
CO2: 25 mmol/L (ref 20–31)
CREATININE: 0.96 mg/dL — AB (ref 0.60–0.88)
Calcium: 9.5 mg/dL (ref 8.6–10.4)
Glucose, Bld: 87 mg/dL (ref 70–99)
POTASSIUM: 3.8 mmol/L (ref 3.5–5.3)
SODIUM: 141 mmol/L (ref 135–146)
TOTAL PROTEIN: 6.7 g/dL (ref 6.1–8.1)
Total Bilirubin: 0.6 mg/dL (ref 0.2–1.2)

## 2017-02-06 LAB — LIPID PANEL
CHOLESTEROL: 197 mg/dL (ref <200)
HDL: 88 mg/dL (ref >50)
LDL Cholesterol: 94 mg/dL (ref <100)
TRIGLYCERIDES: 76 mg/dL (ref <150)
Total CHOL/HDL Ratio: 2.2 Ratio (ref <5.0)
VLDL: 15 mg/dL (ref <30)

## 2017-02-06 NOTE — Assessment & Plan Note (Signed)
Diabetes has been well-controlled. There is confusion on which medication she is taking. I decrease her to a half a tablet of metformin so 500 mg twice a day but she states she has been taking 1000 mg twice a day. She is not sure she is taking the glimepiride. Med check her labs today she will calls with the names of her medications that I can titrate things. I would like to stop the sulfonylurea

## 2017-02-06 NOTE — Patient Instructions (Addendum)
Check medicine box look for glimiperide or amaryl Metformin should be 1/2 tablet twice a day  F/U  6 months for Physical

## 2017-02-06 NOTE — Progress Notes (Signed)
Subjective:    Patient ID: Theresa Howe, female    DOB: 1931/01/08, 83 y.o.   MRN: [id number]  Patient presents for 4 month F/U (is fasting)   DM- Last A1C 6%, no hypoglycemia, taking amaryl 2mg  once a day and metformin on statin and ASA   - last LDL 82,states CBG   Has eye appt next week- Dr. Kateri Mc with podiatry    Hypertension- taking diovan without difficulty   Her dog was injured by some men in the neighborhood, she has been taking care of him, giving meds and checking on the dog every 2-3 hours therefore not sleeping well   She is walking regulary and exercising   Review Of Systems:  GEN- denies fatigue, fever, weight loss,weakness, recent illness HEENT- denies eye drainage, change in vision, nasal discharge, CVS- denies chest pain, palpitations RESP- denies SOB, cough, wheeze ABD- denies N/V, change in stools, abd pain GU- denies dysuria, hematuria, dribbling, incontinence MSK- denies joint pain, muscle aches, injury Neuro- denies headache, dizziness, syncope, seizure activity       Objective:    BP 122/78   Pulse 68   Temp 98.1 F (36.7 C) (Oral)   Resp 16   Ht 5\' 3"  (1.6 m)   Wt 127 lb (57.6 kg)   SpO2 98%   BMI 22.50 kg/m  GEN- NAD, alert and oriented x3 HEENT- PERRL, EOMI, non injected sclera, pink conjunctiva, MMM, oropharynx clear CVS- RRR, no murmur RESP-CTAB ABD-NABS,soft,NT,ND EXT- No edema Pulses- Radial  2+        Assessment & Plan:      Problem List Items Addressed This Visit    Hypertension associated with diabetes (HCC)    Pressures controlled to change her medication. Continue statin drug check her labs today      Hyperlipidemia LDL goal <70 - Primary   Relevant Orders   Lipid panel   Diabetic neuropathy (HCC)   Diabetes with neurologic complications (HCC)    Diabetes has been well-controlled. There is confusion on which medication she is taking. I decrease her to a half a tablet of metformin so 500 mg twice a day  but she states she has been taking 1000 mg twice a day. She is not sure she is taking the glimepiride. Med check her labs today she will calls with the names of her medications that I can titrate things. I would like to stop the sulfonylurea      Relevant Orders   CBC with Differential/Platelet   Hemoglobin A1c   Comprehensive metabolic panel      Note: This dictation was prepared with Dragon dictation along with smaller phrase technology. Any transcriptional errors that result from this process are unintentional.

## 2017-02-06 NOTE — Assessment & Plan Note (Signed)
Pressures controlled to change her medication. Continue statin drug check her labs today

## 2017-02-07 LAB — HEMOGLOBIN A1C
Hgb A1c MFr Bld: 6.2 % — ABNORMAL HIGH (ref <5.7)
Mean Plasma Glucose: 131 mg/dL

## 2017-02-09 ENCOUNTER — Other Ambulatory Visit: Payer: Self-pay

## 2017-02-20 ENCOUNTER — Other Ambulatory Visit: Payer: Self-pay | Admitting: Family Medicine

## 2017-02-20 DIAGNOSIS — H2512 Age-related nuclear cataract, left eye: Secondary | ICD-10-CM | POA: Diagnosis not present

## 2017-02-20 DIAGNOSIS — N8111 Cystocele, midline: Secondary | ICD-10-CM | POA: Diagnosis not present

## 2017-02-20 DIAGNOSIS — H4050X0 Glaucoma secondary to other eye disorders, unspecified eye, stage unspecified: Secondary | ICD-10-CM | POA: Diagnosis not present

## 2017-02-20 DIAGNOSIS — H538 Other visual disturbances: Secondary | ICD-10-CM | POA: Diagnosis not present

## 2017-03-27 DIAGNOSIS — E1151 Type 2 diabetes mellitus with diabetic peripheral angiopathy without gangrene: Secondary | ICD-10-CM | POA: Diagnosis not present

## 2017-03-27 DIAGNOSIS — E1140 Type 2 diabetes mellitus with diabetic neuropathy, unspecified: Secondary | ICD-10-CM | POA: Diagnosis not present

## 2017-05-16 DIAGNOSIS — N8111 Cystocele, midline: Secondary | ICD-10-CM | POA: Diagnosis not present

## 2017-06-26 DIAGNOSIS — E1151 Type 2 diabetes mellitus with diabetic peripheral angiopathy without gangrene: Secondary | ICD-10-CM | POA: Diagnosis not present

## 2017-06-26 DIAGNOSIS — E1140 Type 2 diabetes mellitus with diabetic neuropathy, unspecified: Secondary | ICD-10-CM | POA: Diagnosis not present

## 2017-07-17 ENCOUNTER — Telehealth: Payer: Self-pay | Admitting: *Deleted

## 2017-07-17 MED ORDER — LOSARTAN POTASSIUM-HCTZ 50-12.5 MG PO TABS: 1.0000 | ORAL_TABLET | Freq: Every day | ORAL | 3 refills | Status: DC

## 2017-07-17 NOTE — Telephone Encounter (Signed)
Received letter from insurance requesting MD to advise on alternative for Valsartan D/T recall.   MD made aware and NO given for Losartan HCTZ 50/12.5mg .   Prescription sent to pharmacy.   Call placed to patient. Theresa Howe.

## 2017-07-23 NOTE — Telephone Encounter (Signed)
Call placed to patient. LMTRC.  

## 2017-07-24 NOTE — Telephone Encounter (Signed)
Multiple calls placed to patient with no answer and no return call.   Message to be closed.  

## 2017-08-07 ENCOUNTER — Ambulatory Visit: Payer: Medicare Other | Admitting: Family Medicine

## 2017-08-07 DIAGNOSIS — N8111 Cystocele, midline: Secondary | ICD-10-CM | POA: Diagnosis not present

## 2017-08-07 DIAGNOSIS — Z1231 Encounter for screening mammogram for malignant neoplasm of breast: Secondary | ICD-10-CM | POA: Diagnosis not present

## 2017-08-08 ENCOUNTER — Encounter: Payer: Self-pay | Admitting: Family Medicine

## 2017-08-08 ENCOUNTER — Ambulatory Visit (INDEPENDENT_AMBULATORY_CARE_PROVIDER_SITE_OTHER): Payer: Medicare Other | Admitting: Family Medicine

## 2017-08-08 VITALS — BP 136/72 | HR 62 | Temp 97.9°F | Resp 14 | Ht 63.0 in | Wt 114.0 lb

## 2017-08-08 DIAGNOSIS — R634 Abnormal weight loss: Secondary | ICD-10-CM

## 2017-08-08 DIAGNOSIS — I10 Essential (primary) hypertension: Secondary | ICD-10-CM | POA: Diagnosis not present

## 2017-08-08 DIAGNOSIS — E785 Hyperlipidemia, unspecified: Secondary | ICD-10-CM

## 2017-08-08 DIAGNOSIS — E1143 Type 2 diabetes mellitus with diabetic autonomic (poly)neuropathy: Secondary | ICD-10-CM

## 2017-08-08 DIAGNOSIS — E1159 Type 2 diabetes mellitus with other circulatory complications: Secondary | ICD-10-CM

## 2017-08-08 DIAGNOSIS — I152 Hypertension secondary to endocrine disorders: Secondary | ICD-10-CM

## 2017-08-08 LAB — CBC WITH DIFFERENTIAL/PLATELET
Basophils Absolute: 32 cells/uL (ref 0–200)
Basophils Relative: 1 %
EOS PCT: 2 %
Eosinophils Absolute: 64 cells/uL (ref 15–500)
HCT: 35.7 % (ref 35.0–45.0)
HEMOGLOBIN: 11.2 g/dL — AB (ref 12.0–15.0)
LYMPHS ABS: 1440 {cells}/uL (ref 850–3900)
Lymphocytes Relative: 45 %
MCH: 27.6 pg (ref 27.0–33.0)
MCHC: 31.4 g/dL — ABNORMAL LOW (ref 32.0–36.0)
MCV: 87.9 fL (ref 80.0–100.0)
MONO ABS: 256 {cells}/uL (ref 200–950)
MPV: 10 fL (ref 7.5–12.5)
Monocytes Relative: 8 %
NEUTROS ABS: 1408 {cells}/uL — AB (ref 1500–7800)
Neutrophils Relative %: 44 %
Platelets: 316 10*3/uL (ref 140–400)
RBC: 4.06 MIL/uL (ref 3.80–5.10)
RDW: 14.8 % (ref 11.0–15.0)
WBC: 3.2 10*3/uL — AB (ref 3.8–10.8)

## 2017-08-08 LAB — TSH: TSH: 0.78 mIU/L

## 2017-08-08 NOTE — Assessment & Plan Note (Signed)
Controlled, no changes. 

## 2017-08-08 NOTE — Patient Instructions (Addendum)
Eat more protein We will call with lab results  F/U 1 month for WEIGHT CHECK

## 2017-08-08 NOTE — Progress Notes (Signed)
Subjective:    Patient ID: Theresa Howe, female    DOB: [birth year], 83 y.o.   MRN: [id number]  Patient presents for Follow-up (is fasting)   Went UC 2-3 weeks ago, after eating restaurant had vomiting/diarrhea,had fever. Was sick for 3 days then went to UC given nausea medication, no IVF were given.   drinking boost once a day  States she also had a period were her cousin died and someone else died in the family she did not eat well for about a week this was or she had a stomach bug. Now her appetite is good is not having pain no nausea vomiting  DM-  CBG this morning was 122, her last A1c was 6.2% and discontinue the Amaryl she was continued on metformin 500 mg twice a day.  HTN -changed to losartan HCTZ daily for blood pressure is not had any difficulty with this medication.     Review Of Systems:  GEN- denies fatigue, fever, +weight loss,weakness, recent illness HEENT- denies eye drainage, change in vision, nasal discharge, CVS- denies chest pain, palpitations RESP- denies SOB, cough, wheeze ABD- denies N/V, change in stools, abd pain GU- denies dysuria, hematuria, dribbling, incontinence MSK- denies joint pain, muscle aches, injury Neuro- denies headache, dizziness, syncope, seizure activity       Objective:    BP 136/72   Pulse 62   Temp 97.9 F (36.6 C) (Oral)   Resp 14   Ht 5\' 3"  (1.6 m)   Wt 114 lb (51.7 kg)   SpO2 98%   BMI 20.19 kg/m  GEN- NAD, alert and oriented x3 HEENT- PERRL, EOMI, non injected sclera, pink conjunctiva, MMM, oropharynx clear Neck- Supple, no thyromegaly CVS- RRR, no murmur RESP-CTAB ABD-NABS,soft,NT,ND EXT- No edema Pulses- Radial  2+        Assessment & Plan:      Problem List Items Addressed This Visit      Unprioritized   Diabetic neuropathy (HCC)   Hypertension associated with diabetes (HCC) - Primary    Controlled, no changes       Relevant Orders   CBC with Differential/Platelet   Comprehensive metabolic  panel   Hyperlipidemia LDL goal <70    At goal on zocor, no changes       Diabetes with neurologic complications (HCC)    Diabetes has been quite well-controlled now with her weight loss would likely need to decrease the metformin as well as check an A1c today as well as a metabolic panel. Will also check a TSH with her weight loss but I think was due to accommodation of the stomach and her not eating very well when she gets a low mood. We discussed eating more protein discussed some meals that she can use as well as increase in her bases to twice a day. I will have her come back in one month for recheck on her weight and she has continued to lose weight we'll look at malignancy screening      Relevant Orders   Hemoglobin A1c    Other Visit Diagnoses    Weight loss       Relevant Orders   TSH      Note: This dictation was prepared with Dragon dictation along with smaller phrase technology. Any transcriptional errors that result from this process are unintentional.

## 2017-08-08 NOTE — Assessment & Plan Note (Signed)
At goal on zocor, no changes

## 2017-08-08 NOTE — Assessment & Plan Note (Signed)
Diabetes has been quite well-controlled now with her weight loss would likely need to decrease the metformin as well as check an A1c today as well as a metabolic panel. Will also check a TSH with her weight loss but I think was due to accommodation of the stomach and her not eating very well when she gets a low mood. We discussed eating more protein discussed some meals that she can use as well as increase in her bases to twice a day. I will have her come back in one month for recheck on her weight and she has continued to lose weight we'll look at malignancy screening

## 2017-08-09 ENCOUNTER — Telehealth: Payer: Self-pay | Admitting: *Deleted

## 2017-08-09 LAB — COMPREHENSIVE METABOLIC PANEL
ALK PHOS: 53 U/L (ref 33–130)
ALT: 12 U/L (ref 6–29)
AST: 22 U/L (ref 10–35)
Albumin: 3.9 g/dL (ref 3.6–5.1)
BILIRUBIN TOTAL: 0.7 mg/dL (ref 0.2–1.2)
BUN: 11 mg/dL (ref 7–25)
CO2: 24 mmol/L (ref 20–32)
CREATININE: 0.81 mg/dL (ref 0.60–0.88)
Calcium: 9.6 mg/dL (ref 8.6–10.4)
Chloride: 103 mmol/L (ref 98–110)
Glucose, Bld: 88 mg/dL (ref 70–99)
Potassium: 3.6 mmol/L (ref 3.5–5.3)
SODIUM: 141 mmol/L (ref 135–146)
TOTAL PROTEIN: 6.3 g/dL (ref 6.1–8.1)

## 2017-08-09 LAB — HEMOGLOBIN A1C
HEMOGLOBIN A1C: 6.9 % — AB (ref <5.7)
MEAN PLASMA GLUCOSE: 151 mg/dL

## 2017-08-09 MED ORDER — BLOOD GLUCOSE SYSTEM PAK KIT: PACK | 1 refills | Status: DC

## 2017-08-09 NOTE — Telephone Encounter (Signed)
Patient in office and requested new prescription for meter.   Prescription sent to pharmacy.

## 2017-08-14 ENCOUNTER — Encounter: Payer: Self-pay | Admitting: *Deleted

## 2017-08-27 ENCOUNTER — Other Ambulatory Visit: Payer: Self-pay | Admitting: Family Medicine

## 2017-08-27 MED ORDER — ALENDRONATE SODIUM 70 MG PO TABS: ORAL_TABLET | ORAL | 0 refills | Status: DC

## 2017-08-27 NOTE — Telephone Encounter (Signed)
Refill appropriate 

## 2017-08-27 NOTE — Addendum Note (Signed)
Addended by: Vonna Kotyk A on: 08/27/2017 04:59 PM   Modules accepted: Orders

## 2017-08-28 ENCOUNTER — Other Ambulatory Visit: Payer: Self-pay | Admitting: Family Medicine

## 2017-08-28 DIAGNOSIS — H538 Other visual disturbances: Secondary | ICD-10-CM | POA: Diagnosis not present

## 2017-08-28 DIAGNOSIS — H2512 Age-related nuclear cataract, left eye: Secondary | ICD-10-CM | POA: Diagnosis not present

## 2017-08-28 DIAGNOSIS — H4050X0 Glaucoma secondary to other eye disorders, unspecified eye, stage unspecified: Secondary | ICD-10-CM | POA: Diagnosis not present

## 2017-08-30 MED ORDER — ALENDRONATE SODIUM 70 MG PO TABS: ORAL_TABLET | ORAL | 0 refills | Status: DC

## 2017-08-30 NOTE — Addendum Note (Signed)
Addended by: Vonna Kotyk A on: 08/30/2017 02:27 PM   Modules accepted: Orders

## 2017-09-10 ENCOUNTER — Other Ambulatory Visit: Payer: Self-pay | Admitting: Family Medicine

## 2017-09-10 NOTE — Telephone Encounter (Signed)
Test strips refilled

## 2017-09-11 ENCOUNTER — Encounter: Payer: Self-pay | Admitting: Family Medicine

## 2017-09-11 ENCOUNTER — Ambulatory Visit (INDEPENDENT_AMBULATORY_CARE_PROVIDER_SITE_OTHER): Payer: Medicare Other | Admitting: Family Medicine

## 2017-09-11 VITALS — BP 122/64 | HR 80 | Temp 97.9°F | Resp 16 | Ht 63.0 in | Wt 110.0 lb

## 2017-09-11 DIAGNOSIS — E440 Moderate protein-calorie malnutrition: Secondary | ICD-10-CM

## 2017-09-11 DIAGNOSIS — R634 Abnormal weight loss: Secondary | ICD-10-CM | POA: Insufficient documentation

## 2017-09-11 DIAGNOSIS — Z23 Encounter for immunization: Secondary | ICD-10-CM

## 2017-09-11 DIAGNOSIS — E46 Unspecified protein-calorie malnutrition: Secondary | ICD-10-CM | POA: Insufficient documentation

## 2017-09-11 DIAGNOSIS — R194 Change in bowel habit: Secondary | ICD-10-CM | POA: Diagnosis not present

## 2017-09-11 LAB — CBC WITH DIFFERENTIAL/PLATELET
BASOS ABS: 11 {cells}/uL (ref 0–200)
Basophils Relative: 0.4 %
EOS ABS: 20 {cells}/uL (ref 15–500)
Eosinophils Relative: 0.7 %
HCT: 36 % (ref 35.0–45.0)
Hemoglobin: 11.5 g/dL — ABNORMAL LOW (ref 11.7–15.5)
Lymphs Abs: 899 cells/uL (ref 850–3900)
MCH: 27.1 pg (ref 27.0–33.0)
MCHC: 31.9 g/dL — AB (ref 32.0–36.0)
MCV: 84.9 fL (ref 80.0–100.0)
MONOS PCT: 9 %
MPV: 11.1 fL (ref 7.5–12.5)
Neutro Abs: 1618 cells/uL (ref 1500–7800)
Neutrophils Relative %: 57.8 %
PLATELETS: 294 10*3/uL (ref 140–400)
RBC: 4.24 10*6/uL (ref 3.80–5.10)
RDW: 13.4 % (ref 11.0–15.0)
TOTAL LYMPHOCYTE: 32.1 %
WBC mixed population: 252 cells/uL (ref 200–950)
WBC: 2.8 10*3/uL — ABNORMAL LOW (ref 3.8–10.8)

## 2017-09-11 LAB — BASIC METABOLIC PANEL
BUN: 13 mg/dL (ref 7–25)
CALCIUM: 9.4 mg/dL (ref 8.6–10.4)
CO2: 25 mmol/L (ref 20–32)
Chloride: 99 mmol/L (ref 98–110)
Creat: 0.79 mg/dL (ref 0.60–0.88)
GLUCOSE: 175 mg/dL — AB (ref 65–99)
Potassium: 3.4 mmol/L — ABNORMAL LOW (ref 3.5–5.3)
SODIUM: 137 mmol/L (ref 135–146)

## 2017-09-11 MED ORDER — SIMVASTATIN 40 MG PO TABS: ORAL_TABLET | ORAL | 2 refills | Status: DC

## 2017-09-11 MED ORDER — MIRTAZAPINE 15 MG PO TABS: 7.5000 mg | ORAL_TABLET | Freq: Every day | ORAL | 3 refills | Status: DC

## 2017-09-11 NOTE — Patient Instructions (Signed)
Release of records- Dr. Ronita Hipps, need last Mammogram/Exam Start remeron 7.5mg  at bedtime- appettite  We will call for your chest xray and CT scan

## 2017-09-11 NOTE — Progress Notes (Signed)
Subjective:    Patient ID: Theresa Howe, female    DOB: Feb 16, 1931, 83 y.o.   MRN: [id number]  Patient presents for Follow-up (is not fasting)  Pt here for weight check, seen 8/22 she has been stressed states she was not eating well. Her weight at last visit 114lbs. Since Feb she has lost 17lbs.  Recent labs were normal  Face and her brothers are both sick right now but has been on her mind. States she has been trying to eat better in drink ensure. When asked if she had any pain anywhere she states no but did state that she knows a change in her stools sometimes it is darker last week she had diarrhea after eating. Sometimes she just doesn't feel like eating and gets full early. Other times he has a good appetite.  24 hour recall: Dinner- had fried potatoes, pork chop, cabbage Lunch- [place] sandwhich and fruit  Breakfast this morning- banana and cereal  Ensure drinks 1-2 a day,   Mammogram UTD done by GYN Colonoscopy 2013 internal hemorroids  Review Of Systems:  GEN- denies fatigue, fever,+ weight loss,weakness, recent illness HEENT- denies eye drainage, change in vision, nasal discharge, CVS- denies chest pain, palpitations RESP- denies SOB, cough, wheeze ABD- denies N/V, change in stools, abd pain GU- denies dysuria, hematuria, dribbling, incontinence MSK- denies joint pain, muscle aches, injury Neuro- denies headache, dizziness, syncope, seizure activity       Objective:    BP 122/64   Pulse 80   Temp 97.9 F (36.6 C) (Oral)   Resp 16   Ht 5\' 3"  (1.6 m)   Wt 110 lb (49.9 kg)   SpO2 99%   BMI 19.49 kg/m  GEN- NAD, alert and oriented x3,well appearing  HEENT- PERRL, EOMI, non injected sclera, pink conjunctiva, MMM, oropharynx clear Neck- Supple, no thyromegaly CVS- RRR, no murmur RESP-CTAB ABD-NABS,soft,NT,ND EXT- No edema Pulses- Radial  2+        Assessment & Plan:      Problem List Items Addressed This Visit      Unprioritized   Weight loss   I'm concerned about her ongoing and intentional weight loss. While I think there is a component with her being a little and the depressed side from her family members being sick she still is losing significant weight. Her blood sugars have been okay. Thyroid is normal her last metabolic CBC were normal though I am repeating this today. She is a nonsmoker. As she has had some GI discomfort and upset recently questionable early satiety and then obtain a CT of abdomen and pelvis also didn't obtain a chest x-ray to look for any other underlying cause of weight loss. We'll start her on mirtazapine 7.5 mg to help with appetite she will continue ensure twice a day      Relevant Orders   DG Chest 2 View   Protein-calorie malnutrition (HCC)   Relevant Orders   CBC with Differential/Platelet   Basic metabolic panel    Other Visit Diagnoses    Bowel habit changes    -  Primary   Relevant Orders   CT Abdomen Pelvis Wo Contrast   Encounter for immunization       Relevant Medications   simvastatin (ZOCOR) 40 MG tablet   mirtazapine (REMERON) 15 MG tablet   Other Relevant Orders   Flu vaccine HIGH DOSE PF (Completed)   CBC with Differential/Platelet   Basic metabolic panel   DG Chest 2 View  Abnormal weight loss       Relevant Orders   DG Chest 2 View   CT Abdomen Pelvis Wo Contrast      Note: This dictation was prepared with Dragon dictation along with smaller phrase technology. Any transcriptional errors that result from this process are unintentional.

## 2017-09-11 NOTE — Assessment & Plan Note (Signed)
I'm concerned about her ongoing and intentional weight loss. While I think there is a component with her being a little and the depressed side from her family members being sick she still is losing significant weight. Her blood sugars have been okay. Thyroid is normal her last metabolic CBC were normal though I am repeating this today. She is a nonsmoker. As she has had some GI discomfort and upset recently questionable early satiety and then obtain a CT of abdomen and pelvis also didn't obtain a chest x-ray to look for any other underlying cause of weight loss. We'll start her on mirtazapine 7.5 mg to help with appetite she will continue ensure twice a day

## 2017-09-13 ENCOUNTER — Other Ambulatory Visit: Payer: Self-pay | Admitting: *Deleted

## 2017-09-13 MED ORDER — POTASSIUM CHLORIDE CRYS ER 20 MEQ PO TBCR: 20.0000 meq | EXTENDED_RELEASE_TABLET | Freq: Every day | ORAL | 0 refills | Status: DC

## 2017-09-20 ENCOUNTER — Other Ambulatory Visit: Payer: Self-pay | Admitting: Family Medicine

## 2017-09-20 MED ORDER — ALENDRONATE SODIUM 70 MG PO TABS: ORAL_TABLET | ORAL | 3 refills | Status: DC

## 2017-09-25 ENCOUNTER — Ambulatory Visit (HOSPITAL_COMMUNITY)
Admission: RE | Admit: 2017-09-25 | Discharge: 2017-09-25 | Disposition: A | Discharge status: Home / Self Care | Payer: Medicare Other | Source: Ambulatory Visit | Attending: Family Medicine | Admitting: Family Medicine

## 2017-09-25 DIAGNOSIS — R634 Abnormal weight loss: Secondary | ICD-10-CM | POA: Diagnosis not present

## 2017-09-25 DIAGNOSIS — J984 Other disorders of lung: Secondary | ICD-10-CM | POA: Diagnosis not present

## 2017-09-25 DIAGNOSIS — M47896 Other spondylosis, lumbar region: Secondary | ICD-10-CM | POA: Diagnosis not present

## 2017-09-25 DIAGNOSIS — R194 Change in bowel habit: Secondary | ICD-10-CM | POA: Diagnosis not present

## 2017-09-25 DIAGNOSIS — Z23 Encounter for immunization: Secondary | ICD-10-CM | POA: Diagnosis not present

## 2017-09-25 DIAGNOSIS — M5136 Other intervertebral disc degeneration, lumbar region: Secondary | ICD-10-CM | POA: Insufficient documentation

## 2017-09-25 DIAGNOSIS — I700 Atherosclerosis of aorta: Secondary | ICD-10-CM | POA: Insufficient documentation

## 2017-09-25 DIAGNOSIS — M419 Scoliosis, unspecified: Secondary | ICD-10-CM | POA: Diagnosis not present

## 2017-09-25 DIAGNOSIS — K6389 Other specified diseases of intestine: Secondary | ICD-10-CM | POA: Diagnosis not present

## 2017-09-27 ENCOUNTER — Other Ambulatory Visit: Payer: Self-pay | Admitting: Family Medicine

## 2017-09-27 DIAGNOSIS — I81 Portal vein thrombosis: Secondary | ICD-10-CM

## 2017-09-27 DIAGNOSIS — K8689 Other specified diseases of pancreas: Secondary | ICD-10-CM

## 2017-09-27 DIAGNOSIS — D3A00 Benign carcinoid tumor of unspecified site: Secondary | ICD-10-CM

## 2017-09-27 DIAGNOSIS — D490 Neoplasm of unspecified behavior of digestive system: Secondary | ICD-10-CM

## 2017-09-27 DIAGNOSIS — R634 Abnormal weight loss: Secondary | ICD-10-CM

## 2017-09-27 NOTE — Progress Notes (Signed)
Needs MRI upper abodmen, pancreatic protcal Also to look at portal vein

## 2017-09-28 ENCOUNTER — Ambulatory Visit: Payer: Medicare Other | Admitting: Family Medicine

## 2017-10-01 ENCOUNTER — Ambulatory Visit (INDEPENDENT_AMBULATORY_CARE_PROVIDER_SITE_OTHER): Payer: Medicare Other | Admitting: Family Medicine

## 2017-10-01 ENCOUNTER — Telehealth: Payer: Self-pay | Admitting: Gastroenterology

## 2017-10-01 ENCOUNTER — Encounter: Payer: Self-pay | Admitting: Family Medicine

## 2017-10-01 VITALS — BP 122/64 | HR 80 | Temp 98.0°F | Resp 16 | Ht 63.0 in | Wt 107.0 lb

## 2017-10-01 DIAGNOSIS — D3A00 Benign carcinoid tumor of unspecified site: Secondary | ICD-10-CM

## 2017-10-01 DIAGNOSIS — K8689 Other specified diseases of pancreas: Secondary | ICD-10-CM

## 2017-10-01 MED ORDER — LATANOPROST 0.005 % OP SOLN: 1.0000 [drp] | Freq: Every day | OPHTHALMIC | 3 refills | Status: DC

## 2017-10-01 MED ORDER — FLUTICASONE PROPIONATE 50 MCG/ACT NA SUSP: 2.0000 | Freq: Every day | NASAL | 3 refills | Status: DC

## 2017-10-01 MED ORDER — ALENDRONATE SODIUM 70 MG PO TABS: ORAL_TABLET | ORAL | 3 refills | Status: DC

## 2017-10-01 NOTE — Progress Notes (Signed)
Subjective:    Patient ID: Theresa Howe, female    DOB: [birth year], 83 y.o.   MRN: [id number]  Patient presents for Discuss CT  Patient here to discuss her CT scan. She was seen secondary to weight loss couple months ago she had continued weight loss on her follow-up visits therefore started cancer workup. 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 Recommendation was to have an MRI of her upper abdomen to distinguish the pancreatic duct lesion in the portal vein lesion to make sure these are not metastatic lesions. This is scheduled for Wednesday.She denies any abdominal pain states that her bowels are back to normal states that she is eating well her weight is down another 3 pounds. She denies any symptoms of carcinoid at this time.  I did send a referral to her gastroenterologist with Theresa Howe a message back to states that they recommended sending directly to general surgery.   Review Of Systems:  GEN- denies fatigue, fever, weight loss,weakness, recent illness HEENT- denies eye drainage, change in vision, nasal discharge, CVS- denies chest pain, palpitations RESP- denies SOB, cough, wheeze ABD- denies N/V, change in stools, abd pain GU- denies dysuria, hematuria, dribbling, incontinence MSK- denies joint pain, muscle aches, injury Neuro- denies headache, dizziness, syncope, seizure activity       Objective:    BP 122/64   Pulse 80   Temp 98 F (36.7 C) (Oral)   Resp 16   Ht 5\' 3"  (1.6 m)   Wt 107 lb (48.5 kg)   SpO2 94%   BMI 18.95 kg/m  GEN- NAD, alert and oriented x3        Assessment & Plan:      Problem List Items Addressed This Visit    None    Visit Diagnoses    Carcinoid tumor    -  Primary   Refer to general surgery, MRI to be done Wed of upper abdomen, to look at pancreatic  duct and portal vein. Discussed with pt, also discussed with her Niece Theresa Howe the patient's request that she does not have any children of her own. Her niece will also accompany her to the surgeon's appointment.   Dilated pancreatic duct          Note: This dictation was prepared with Dragon dictation along with smaller phrase technology. Any transcriptional errors that result from this process are unintentional.

## 2017-10-01 NOTE — Patient Instructions (Signed)
MRI to be done Wed at 3:45pm Do not eat after [facility]  Referral to surgeon for tumor F/U pending results

## 2017-10-01 NOTE — Telephone Encounter (Signed)
Theresa Howe please see Dr Ardis Hughs note

## 2017-10-01 NOTE — Telephone Encounter (Signed)
Sent message to Dr.Butte's office notifying them of Dr.Jacobs' recommendation.

## 2017-10-01 NOTE — Telephone Encounter (Signed)
I reviewed her CT scan, she does not need to see GI. She should be referred to general surgery instead.  Thanks

## 2017-10-01 NOTE — Telephone Encounter (Signed)
Dr Jacobs please advise  

## 2017-10-02 DIAGNOSIS — E1151 Type 2 diabetes mellitus with diabetic peripheral angiopathy without gangrene: Secondary | ICD-10-CM | POA: Diagnosis not present

## 2017-10-02 DIAGNOSIS — E1140 Type 2 diabetes mellitus with diabetic neuropathy, unspecified: Secondary | ICD-10-CM | POA: Diagnosis not present

## 2017-10-03 ENCOUNTER — Other Ambulatory Visit: Payer: Self-pay | Admitting: Family Medicine

## 2017-10-03 ENCOUNTER — Ambulatory Visit (HOSPITAL_COMMUNITY)
Admission: RE | Admit: 2017-10-03 | Discharge: 2017-10-03 | Disposition: A | Discharge status: Home / Self Care | Payer: Medicare Other | Source: Ambulatory Visit | Attending: Family Medicine | Admitting: Family Medicine

## 2017-10-03 DIAGNOSIS — K8689 Other specified diseases of pancreas: Secondary | ICD-10-CM | POA: Insufficient documentation

## 2017-10-03 DIAGNOSIS — R634 Abnormal weight loss: Secondary | ICD-10-CM

## 2017-10-03 DIAGNOSIS — D3A00 Benign carcinoid tumor of unspecified site: Secondary | ICD-10-CM

## 2017-10-03 DIAGNOSIS — I81 Portal vein thrombosis: Secondary | ICD-10-CM

## 2017-10-03 MED ORDER — GADOBENATE DIMEGLUMINE 529 MG/ML IV SOLN
10.0000 mL | Freq: Once | INTRAVENOUS | Status: AC | PRN
  Administered 2017-10-03: 9 mL via INTRAVENOUS

## 2017-10-04 ENCOUNTER — Other Ambulatory Visit: Payer: Self-pay | Admitting: *Deleted

## 2017-10-04 DIAGNOSIS — R630 Anorexia: Secondary | ICD-10-CM

## 2017-10-04 MED ORDER — MIRTAZAPINE 15 MG PO TABS: 7.5000 mg | ORAL_TABLET | Freq: Every day | ORAL | 3 refills | Status: DC

## 2017-10-05 ENCOUNTER — Other Ambulatory Visit: Payer: Medicare Other

## 2017-10-05 ENCOUNTER — Telehealth: Payer: Self-pay | Admitting: Family Medicine

## 2017-10-05 DIAGNOSIS — K769 Liver disease, unspecified: Secondary | ICD-10-CM

## 2017-10-05 DIAGNOSIS — D3A019 Benign carcinoid tumor of the small intestine, unspecified portion: Secondary | ICD-10-CM

## 2017-10-05 DIAGNOSIS — K8689 Other specified diseases of pancreas: Secondary | ICD-10-CM

## 2017-10-05 DIAGNOSIS — R634 Abnormal weight loss: Secondary | ICD-10-CM

## 2017-10-05 NOTE — Telephone Encounter (Signed)
I spoke to patient, she will come in to have labs drawn And will have CT and MRI was insufficient  I also left message with her Niece Seth Bake about the same information She will see Surgeon for this

## 2017-10-06 LAB — COMPLETE METABOLIC PANEL WITH GFR
AG RATIO: 1.4 (calc) (ref 1.0–2.5)
ALBUMIN MSPROF: 3.7 g/dL (ref 3.6–5.1)
ALT: 17 U/L (ref 6–29)
AST: 25 U/L (ref 10–35)
Alkaline phosphatase (APISO): 70 U/L (ref 33–130)
BUN / CREAT RATIO: 8 (calc) (ref 6–22)
BUN: 8 mg/dL (ref 7–25)
CHLORIDE: 104 mmol/L (ref 98–110)
CO2: 26 mmol/L (ref 20–32)
CREATININE: 0.97 mg/dL — AB (ref 0.60–0.88)
Calcium: 9.4 mg/dL (ref 8.6–10.4)
GFR, EST AFRICAN AMERICAN: 62 mL/min/{1.73_m2} (ref >=60)
GFR, EST NON AFRICAN AMERICAN: 54 mL/min/{1.73_m2} — AB (ref >=60)
GLOBULIN: 2.6 g/dL (ref 1.9–3.7)
Glucose, Bld: 144 mg/dL — ABNORMAL HIGH (ref 65–99)
Potassium: 4.6 mmol/L (ref 3.5–5.3)
Sodium: 140 mmol/L (ref 135–146)
Total Bilirubin: 0.5 mg/dL (ref 0.2–1.2)
Total Protein: 6.3 g/dL (ref 6.1–8.1)

## 2017-10-06 LAB — LIPASE: LIPASE: 54 U/L (ref 7–60)

## 2017-10-08 ENCOUNTER — Other Ambulatory Visit: Payer: Self-pay | Admitting: *Deleted

## 2017-10-13 ENCOUNTER — Other Ambulatory Visit: Payer: Self-pay | Admitting: Family Medicine

## 2017-10-17 ENCOUNTER — Ambulatory Visit (HOSPITAL_COMMUNITY)
Admission: RE | Admit: 2017-10-17 | Discharge: 2017-10-17 | Disposition: A | Discharge status: Home / Self Care | Payer: Medicare Other | Source: Ambulatory Visit | Attending: Family Medicine | Admitting: Family Medicine

## 2017-10-17 DIAGNOSIS — K769 Liver disease, unspecified: Secondary | ICD-10-CM | POA: Insufficient documentation

## 2017-10-17 DIAGNOSIS — K8689 Other specified diseases of pancreas: Secondary | ICD-10-CM | POA: Diagnosis not present

## 2017-10-17 DIAGNOSIS — Z681 Body mass index (BMI) 19 or less, adult: Secondary | ICD-10-CM | POA: Diagnosis not present

## 2017-10-17 DIAGNOSIS — R634 Abnormal weight loss: Secondary | ICD-10-CM | POA: Diagnosis not present

## 2017-10-17 DIAGNOSIS — D3A019 Benign carcinoid tumor of the small intestine, unspecified portion: Secondary | ICD-10-CM | POA: Insufficient documentation

## 2017-10-17 MED ORDER — IOPAMIDOL (ISOVUE-300) INJECTION 61%
100.0000 mL | Freq: Once | INTRAVENOUS | Status: AC | PRN
  Administered 2017-10-17: 100 mL via INTRAVENOUS

## 2017-10-18 DIAGNOSIS — R1900 Intra-abdominal and pelvic swelling, mass and lump, unspecified site: Secondary | ICD-10-CM

## 2017-10-18 HISTORY — DX: Intra-abdominal and pelvic swelling, mass and lump, unspecified site: R19.00

## 2017-10-24 ENCOUNTER — Other Ambulatory Visit: Payer: Self-pay | Admitting: Family Medicine

## 2017-10-25 ENCOUNTER — Other Ambulatory Visit: Payer: Self-pay | Admitting: *Deleted

## 2017-10-25 MED ORDER — LOSARTAN POTASSIUM-HCTZ 50-12.5 MG PO TABS: 1.0000 | ORAL_TABLET | Freq: Every day | ORAL | 3 refills | Status: DC

## 2017-10-30 ENCOUNTER — Telehealth: Payer: Self-pay | Admitting: *Deleted

## 2017-10-30 NOTE — Telephone Encounter (Signed)
I dont see anything in system from surgery yet I called [facility], left message with referral coordinator Can you check on this, she was suppose to be seen for the carcinoid tumor

## 2017-10-30 NOTE — Telephone Encounter (Signed)
Received call from Lilli Light, patient niece. [phone]~ telephone.   Requested update on patient with her current health to make sure she is not missing anything.   Advised that caller is not on DPR list. States that she has been discussing patient care with MD and requested MD to call back to update.   MD made aware.

## 2017-10-31 NOTE — Telephone Encounter (Signed)
[facility] called.  Explained pt has confusion, reason why not making appt.  They gave me appt for 11/06/17 ck in 130 PM Dr Barry Dienes.  Niece Venora Maples was called and made aware.Marland Kitchen

## 2017-10-31 NOTE — Telephone Encounter (Signed)
noted 

## 2017-11-06 ENCOUNTER — Other Ambulatory Visit: Payer: Self-pay | Admitting: General Surgery

## 2017-11-06 DIAGNOSIS — K6389 Other specified diseases of intestine: Secondary | ICD-10-CM | POA: Diagnosis not present

## 2017-11-13 ENCOUNTER — Other Ambulatory Visit: Payer: Self-pay | Admitting: Family Medicine

## 2017-11-27 NOTE — H&P (Signed)
Theresa Howe 11/06/2017 2:16 PM Location: [facility] Patient #: [id number] DOB: June 26, 1931 Single / Language: [place] / Race: Black or African American Female   History of Present Illness Almond Lint MD; 11/06/2017 7:32 PM) The patient is a 83 year old female who presents with an abdominal mass. Pt is an 83 yo F who presented to her PCP with significant weight loss. The patient attributed it to stress, but based on bowel habit changing and blood in stool, a CT was done. This showed a mesenteric mass and small bowel mass. She had a dilated pancreatic duct as well. An MRi was done to evaluate this. NO pancreatic head mass was seen. There was also seen a mass in the liver. Due to the issues with breath hold on MRI and no contrast on CT, a triple phase CT was done with contrast to help resolve the liver issues.   She actually had an EUS in 2014 to assess the pancreatic ductal dilation. This was negaitve for pancreatic duct lesions or masses.   The patient does have some bloating, but no n/v. She has lost 25-30 pounds. She is still mowing her lawn and 4 of her family members, moving the lawnmower in her car. She has had several deaths in the family this year. Her brother has had cancer and Dr. Janee Morn operated on him this year.     CT abd/p;elvis 09/26/2017 IMPRESSION: 1. 2.9 cm mass in the left pelvic small bowel, image 55/3. 3.1 cm central pelvic mesenteric mass, image 49/3. Features are suspicious for carcinoid tumor, although metastatic disease or adenocarcinoma could have a similar appearance. I do not see obvious hepatic metastatic lesions, but today's exam lacks IV contrast and accordingly has reduced negative predictive value for hepatic metastatic lesions. 2. Soft tissue density along the right pelvic sidewall is probably ovarian tissue, less likely to represent further tumor extension. 3. Indistinctness of tissue planes around the portal vein.  Well possibly incidental, portal vein thrombosis is not excluded. Limited right upper quadrant sonography could be used to ensure expected hepatopetal flow in the portal vein. 4. Dilated dorsal pancreatic duct in the pancreatic head and pancreatic body. Mild nonspecific fullness of the pancreatic head. Strictly speaking I cannot exclude pancreatic cancer. 5. Aortic Atherosclerosis (ICD10-I70.0). 6. Multilevel lumbar impingement due to spondylosis, scoliosis, and degenerative disc disease.  MRI abd 10/03/2017 IMPRESSION: 1. Nondiagnostic study due to motion artifact. 2. Diffuse distention of main pancreatic duct in the head and body of pancreas as seen on CT. 3. 11 mm enhancing lesion in the inferior right liver cannot be further characterized. Follow-up imaging may prove helpful to ensure stability. If the patient is able to receive iodinated intravenous contrast, CT may be a better means of follow-up due to the better temporal resolution of CT imaging.   Ct abd triple phase contrast. 10/17/2017  IMPRESSION: 1. Subcapsular enhancing lesion in the RIGHT hepatic lobe is favored a benign vascular phenomena. Recommend attention on follow-up. 2. Dilatation of the pancreatic duct without obstructing mass identified. Cannot exclude a ampullary lesion. Consider ERCP for evaluation. 3. The small bowel and pelvic mesenteric mass are not imaged on CT abdomen. In patient with potential neuroendocrine tumor, consider a Ga 73 DOTATATE PET/CT scan which has high sensitivity and specificity for well differentiated neuroendocrine tumors. This could also evaluate the potential ampullary lesion if of neuroendocrine origin.   Past Surgical History (Tanisha A. Manson Passey, RMA; 11/06/2017 2:16 PM) Hysterectomy (not due to cancer) - Complete  Diagnostic Studies History (Tanisha A. Manson Passey, RMA; 11/06/2017 2:16 PM) Colonoscopy  >10 years ago Mammogram  within last year Pap Smear  >5 years  ago  Allergies (Tanisha A. Manson Passey, RMA; 11/06/2017 2:17 PM) No Known Drug Allergies 11/06/2017 Allergies Reconciled   Medication History (Tanisha A. Manson Passey, RMA; 11/06/2017 2:17 PM) Alendronate Sodium (70MG  Tablet, Oral) Active. Losartan Potassium-HCTZ (50-12.5MG  Tablet, Oral) Active. Mirtazapine (15MG  Tablet, Oral) Active. OneTouch Ultra Blue (In Vitro) Active. Simvastatin (40MG  Tablet, Oral) Active. Klor-Con M20 ( Tablet ER, Oral) Active. Medications Reconciled  Social History (Tanisha A. Manson Passey, RMA; 11/06/2017 2:16 PM) Caffeine use  Coffee, Tea. No alcohol use  No drug use  Tobacco use  Never smoker.  Family History (Tanisha A. Manson Passey, RMA; 11/06/2017 2:16 PM) Arthritis  Brother, Mother. Cancer  Brother. Cerebrovascular Accident  Father, Sister. Cervical Cancer  Sister. Colon Polyps  Brother. Depression  Family Members In General, Sister. Diabetes Mellitus  Family Members In General. Heart Disease  Brother. Hypertension  Brother, Family Members In [place], Sister. Migraine Headache  Brother, Family Members In General. Prostate Cancer  Brother.  Pregnancy / Birth History (Tanisha A. Manson Passey, RMA; 11/06/2017 2:16 PM) Age at menarche  19 years. Age of menopause  <47 Gravida  0 Irregular periods  Para  0  Other Problems (Tanisha A. Manson Passey, RMA; 11/06/2017 2:16 PM) Anxiety Disorder  Arthritis  Diabetes Mellitus  High blood pressure  Hypercholesterolemia     Review of Systems (Tanisha A. Brown RMA; 11/06/2017 2:16 PM) General Present- Appetite Loss and Weight Loss. Not Present- Chills, Fatigue, Fever, Night Sweats and Weight Gain. HEENT Present- Wears glasses/contact lenses. Not Present- Earache, Hearing Loss, Hoarseness, Nose Bleed, Oral Ulcers, Ringing in the Ears, Seasonal Allergies, Sinus Pain, Sore Throat, Visual Disturbances and Yellow Eyes. Respiratory Not Present- Bloody sputum, Chronic Cough, Difficulty Breathing, Snoring and  Wheezing. Breast Not Present- Breast Mass, Breast Pain, Nipple Discharge and Skin Changes. Cardiovascular Present- Swelling of Extremities. Not Present- Chest Pain, Difficulty Breathing Lying Down, Leg Cramps, Palpitations, Rapid Heart Rate and Shortness of Breath. Gastrointestinal Present- Abdominal Pain, Bloating, Gets full quickly at meals and Indigestion. Not Present- Bloody Stool, Change in Bowel Habits, Chronic diarrhea, Constipation, Difficulty Swallowing, Excessive gas, Hemorrhoids, Nausea, Rectal Pain and Vomiting. Female Genitourinary Not Present- Frequency, Nocturia, Painful Urination, Pelvic Pain and Urgency. Musculoskeletal Present- Back Pain. Not Present- Joint Pain, Joint Stiffness, Muscle Pain, Muscle Weakness and Swelling of Extremities. Neurological Not Present- Decreased Memory, Fainting, Headaches, Numbness, Seizures, Tingling, Tremor, Trouble walking and Weakness. Psychiatric Present- Anxiety. Not Present- Bipolar, Change in Sleep Pattern, Depression, Fearful and Frequent crying.  Vitals (Tanisha A. Brown RMA; 11/06/2017 2:17 PM) 11/06/2017 2:16 PM Weight: 114.6 lb Height: 63in Body Surface Area: 1.53 m Body Mass Index: 20.3 kg/m  Temp.: 98.53F  Pulse: 89 (Regular)  BP: 122/68 (Sitting, Left Arm, Standard)       Physical Exam Almond Lint MD; 11/06/2017 7:32 PM) General Mental Status-Alert. General Appearance-Consistent with stated age. Hydration-Well hydrated. Voice-Normal.  Head and Neck Head-normocephalic, atraumatic with no lesions or palpable masses. Trachea-midline. Thyroid Gland Characteristics - normal size and consistency.  Eye Eyeball - Bilateral-Extraocular movements intact. Sclera/Conjunctiva - Bilateral-No scleral icterus.  Chest and Lung Exam Chest and lung exam reveals -quiet, even and easy respiratory effort with no use of accessory muscles and on auscultation, normal breath sounds, no adventitious sounds and  normal vocal resonance. Inspection Chest Wall - Normal. Back - normal.  Cardiovascular Cardiovascular examination reveals -normal heart sounds, regular rate and rhythm with  no murmurs and normal pedal pulses bilaterally.  Abdomen Inspection Inspection of the abdomen reveals - No Hernias. Palpation/Percussion Palpation and Percussion of the abdomen reveal - Soft, Non Tender, No Rebound tenderness, No Rigidity (guarding) and No hepatosplenomegaly. Auscultation Auscultation of the abdomen reveals - Bowel sounds normal. Note: lower midline scar c/w hysterectomy   Neurologic Neurologic evaluation reveals -alert and oriented x 3 with no impairment of recent or remote memory. Mental Status-Normal.  Musculoskeletal Global Assessment -Note: no gross deformities.  Normal Exam - Left-Upper Extremity Strength Normal and Lower Extremity Strength Normal. Normal Exam - Right-Upper Extremity Strength Normal and Lower Extremity Strength Normal.  Lymphatic Head & Neck  General Head & Neck Lymphatics: Bilateral - Description - Normal. Axillary  General Axillary Region: Bilateral - Description - Normal. Tenderness - Non Tender. Femoral & Inguinal  Generalized Femoral & Inguinal Lymphatics: Bilateral - Description - No Generalized lymphadenopathy.    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 I will plan for a laparoscopic small bowel resection. This may be difficult due to the potential for adhesions in the pelvis. I discussed that I may need to make the incision larger in order to minimize bleeding or potential bowel injury.  I reviewed risks of surgery including the possibility that I may find significant cancer. I will also plan to examine the external liver and biopsy any visible lesions.  I discussed bleeding, infection, damage to adjacent structures, leak of connection  possibly leading to an ostomy, pneumonia, death, possible prolonged recovery or hospital stay, delirium, pain, blood clot and more.  I advised her and family member that she will need someone with her when discharged for at least a week.  She understands and wishes to proceed. Current Plans Pt Education - [facility] HCI Schedule for Surgery   Signed by Almond Lint, MD (11/06/2017 7:35 PM)

## 2017-11-27 NOTE — Pre-Procedure Instructions (Signed)
KATENA PETITJEAN  11/27/2017      [id number] [place] [place] [address] [place] [address] Phone: [phone] Fax: [phone]  [facility] [place] [place] [facility] [address] [place] [address] Phone: [phone] Fax: [phone]    Your procedure is scheduled on December 18  Report to [facility] at [place].M.  Call this number if you have problems the morning of surgery:  [phone]   Remember:  Do not eat food or drink liquids after midnight.  Continue all medications as directed by your physician except follow these medication instructions before surgery below  Please complete your PRE-SURGERY ENSURE that was given to before you leave your house the morning of surgery.  Please, if able, drink it in one setting. DO NOT SIP.   Take these medicines the morning of surgery with A SIP OF WATER  albuterol (PROAIR HFA)  Eye drops fluticasone (FLONASE) PROAIR HFA  7 days prior to surgery STOP taking any Aspirin(unless otherwise instructed by your surgeon), Aleve, Naproxen, Ibuprofen, Motrin, Advil, Goody's, BC's, all herbal medications, fish oil, and all vitamins  Follow your doctors instructions regarding your Aspirin.  If no instructions were given by your doctor, then you will need to call the prescribing office office to get instructions.     WHAT DO I DO ABOUT MY DIABETES MEDICATION?   Marland Kitchen Do not take oral diabetes medicines (pills) the morning of surgery. metFORMIN (GLUCOPHAGE)   How to Manage Your Diabetes Before and After Surgery  Why is it important to control my blood sugar before and after surgery? . Improving blood sugar levels before and after surgery helps healing and can limit problems. . A way of improving blood sugar control is eating a healthy diet by: o  Eating less sugar and  carbohydrates o  Increasing activity/exercise o  Talking with your doctor about reaching your blood sugar goals . High blood sugars (greater than 180 mg/dL) can raise your risk of infections and slow your recovery, so you will need to focus on controlling your diabetes during the weeks before surgery. . Make sure that the doctor who takes care of your diabetes knows about your planned surgery including the date and location.  How do I manage my blood sugar before surgery? . Check your blood sugar at least 4 times a day, starting 2 days before surgery, to make sure that the level is not too high or low. o Check your blood sugar the morning of your surgery when you wake up and every 2 hours until you get to the Short Stay unit. . If your blood sugar is less than 70 mg/dL, you will need to treat for low blood sugar: o Do not take insulin. o Treat a low blood sugar (less than 70 mg/dL) with  cup of clear juice (cranberry or apple), 4 glucose tablets, OR glucose gel. o Recheck blood sugar in 15 minutes after treatment (to make sure it is greater than 70 mg/dL). If your blood sugar is not greater than 70 mg/dL on recheck, call [phone] for further instructions. . Report your blood sugar to the short stay nurse when you get to Short Stay.  . If you are admitted to the hospital after surgery: o Your blood sugar will be checked by the staff and you will  probably be given insulin after surgery (instead of oral diabetes medicines) to make sure you have good blood sugar levels. o The goal for blood sugar control after surgery is 80-180 mg/dL.    Do not wear jewelry, make-up or nail polish.  Do not wear lotions, powders, or perfumes, or deodorant.  Do not shave 48 hours prior to surgery.    Do not bring valuables to the hospital.  [facility] is not responsible for any belongings or valuables.  Contacts, dentures or bridgework may not be worn into surgery.  Leave your suitcase in the car.  After  surgery it may be brought to your room.  For patients admitted to the hospital, discharge time will be determined by your treatment team.  Patients discharged the day of surgery will not be allowed to drive home.    Special instructions:   [facility]- Preparing For Surgery  Before surgery, you can play an important role. Because skin is not sterile, your skin needs to be as free of germs as possible. You can reduce the number of germs on your skin by washing with CHG (chlorahexidine gluconate) Soap before surgery.  CHG is an antiseptic cleaner which kills germs and bonds with the skin to continue killing germs even after washing.  Please do not use if you have an allergy to CHG or antibacterial soaps. If your skin becomes reddened/irritated stop using the CHG.  Do not shave (including legs and underarms) for at least 48 hours prior to first CHG shower. It is OK to shave your face.  Please follow these instructions carefully.   1. Shower the NIGHT BEFORE SURGERY and the MORNING OF SURGERY with CHG.   2. If you chose to wash your hair, wash your hair first as usual with your normal shampoo.  3. After you shampoo, rinse your hair and body thoroughly to remove the shampoo.  4. Use CHG as you would any other liquid soap. You can apply CHG directly to the skin and wash gently with a scrungie or a clean washcloth.   5. Apply the CHG Soap to your body ONLY FROM THE NECK DOWN.  Do not use on open wounds or open sores. Avoid contact with your eyes, ears, mouth and genitals (private parts). Wash Face and genitals (private parts)  with your normal soap.  6. Wash thoroughly, paying special attention to the area where your surgery will be performed.  7. Thoroughly rinse your body with warm water from the neck down.  8. DO NOT shower/wash with your normal soap after using and rinsing off the CHG Soap.  9. Pat yourself dry with a CLEAN TOWEL.  10. Wear CLEAN PAJAMAS to bed the night before  surgery, wear comfortable clothes the morning of surgery  11. Place CLEAN SHEETS on your bed the night of your first shower and DO NOT SLEEP WITH PETS.    Day of Surgery: Do not apply any deodorants/lotions. Please wear clean clothes to the hospital/surgery center.      Please read over the following fact sheets that you were given.

## 2017-11-28 ENCOUNTER — Other Ambulatory Visit: Payer: Self-pay

## 2017-11-28 ENCOUNTER — Telehealth: Payer: Self-pay | Admitting: Family Medicine

## 2017-11-28 ENCOUNTER — Other Ambulatory Visit: Payer: Self-pay | Admitting: *Deleted

## 2017-11-28 ENCOUNTER — Encounter (HOSPITAL_COMMUNITY)
Admission: RE | Admit: 2017-11-28 | Discharge: 2017-11-28 | Disposition: A | Discharge status: Home / Self Care | Payer: Medicare Other | Source: Ambulatory Visit | Attending: General Surgery | Admitting: General Surgery

## 2017-11-28 ENCOUNTER — Encounter (HOSPITAL_COMMUNITY): Payer: Self-pay

## 2017-11-28 DIAGNOSIS — E1159 Type 2 diabetes mellitus with other circulatory complications: Secondary | ICD-10-CM | POA: Insufficient documentation

## 2017-11-28 DIAGNOSIS — E876 Hypokalemia: Secondary | ICD-10-CM

## 2017-11-28 DIAGNOSIS — N8110 Cystocele, unspecified: Secondary | ICD-10-CM | POA: Diagnosis not present

## 2017-11-28 DIAGNOSIS — Z01812 Encounter for preprocedural laboratory examination: Secondary | ICD-10-CM | POA: Diagnosis not present

## 2017-11-28 DIAGNOSIS — F411 Generalized anxiety disorder: Secondary | ICD-10-CM | POA: Insufficient documentation

## 2017-11-28 DIAGNOSIS — R634 Abnormal weight loss: Secondary | ICD-10-CM | POA: Diagnosis not present

## 2017-11-28 DIAGNOSIS — M810 Age-related osteoporosis without current pathological fracture: Secondary | ICD-10-CM | POA: Insufficient documentation

## 2017-11-28 DIAGNOSIS — E1149 Type 2 diabetes mellitus with other diabetic neurological complication: Secondary | ICD-10-CM | POA: Diagnosis not present

## 2017-11-28 DIAGNOSIS — H409 Unspecified glaucoma: Secondary | ICD-10-CM | POA: Insufficient documentation

## 2017-11-28 DIAGNOSIS — I10 Essential (primary) hypertension: Secondary | ICD-10-CM | POA: Insufficient documentation

## 2017-11-28 DIAGNOSIS — E785 Hyperlipidemia, unspecified: Secondary | ICD-10-CM | POA: Insufficient documentation

## 2017-11-28 DIAGNOSIS — M7918 Myalgia, other site: Secondary | ICD-10-CM | POA: Diagnosis not present

## 2017-11-28 HISTORY — DX: Polyneuropathy, unspecified: G62.9

## 2017-11-28 HISTORY — DX: Wheezing: R06.2

## 2017-11-28 HISTORY — DX: Dyspnea, unspecified: R06.00

## 2017-11-28 LAB — ABO/RH: ABO/RH(D): AB POS

## 2017-11-28 LAB — CBC WITH DIFFERENTIAL/PLATELET
Basophils Absolute: 0 10*3/uL (ref 0.0–0.1)
Basophils Relative: 0 %
EOS ABS: 0 10*3/uL (ref 0.0–0.7)
Eosinophils Relative: 0 %
HEMATOCRIT: 34.7 % — AB (ref 36.0–46.0)
HEMOGLOBIN: 11.6 g/dL — AB (ref 12.0–15.0)
LYMPHS ABS: 1.2 10*3/uL (ref 0.7–4.0)
Lymphocytes Relative: 42 %
MCH: 27.6 pg (ref 26.0–34.0)
MCHC: 33.4 g/dL (ref 30.0–36.0)
MCV: 82.4 fL (ref 78.0–100.0)
MONOS PCT: 12 %
Monocytes Absolute: 0.3 10*3/uL (ref 0.1–1.0)
NEUTROS ABS: 1.2 10*3/uL — AB (ref 1.7–7.7)
NEUTROS PCT: 46 %
Platelets: 259 10*3/uL (ref 150–400)
RBC: 4.21 MIL/uL (ref 3.87–5.11)
RDW: 14.5 % (ref 11.5–15.5)
WBC: 2.7 10*3/uL — ABNORMAL LOW (ref 4.0–10.5)

## 2017-11-28 LAB — TYPE AND SCREEN
ABO/RH(D): AB POS
Antibody Screen: NEGATIVE

## 2017-11-28 LAB — COMPREHENSIVE METABOLIC PANEL
ALK PHOS: 61 U/L (ref 38–126)
ALT: 18 U/L (ref 14–54)
ANION GAP: 11 (ref 5–15)
AST: 30 U/L (ref 15–41)
Albumin: 3.4 g/dL — ABNORMAL LOW (ref 3.5–5.0)
BILIRUBIN TOTAL: 0.8 mg/dL (ref 0.3–1.2)
BUN: 10 mg/dL (ref 6–20)
CALCIUM: 9 mg/dL (ref 8.9–10.3)
CO2: 25 mmol/L (ref 22–32)
Chloride: 101 mmol/L (ref 101–111)
Creatinine, Ser: 0.82 mg/dL (ref 0.44–1.00)
GFR calc non Af Amer: >60 mL/min (ref >60)
Glucose, Bld: 107 mg/dL — ABNORMAL HIGH (ref 65–99)
Potassium: 2.7 mmol/L — CL (ref 3.5–5.1)
Sodium: 137 mmol/L (ref 135–145)
TOTAL PROTEIN: 6.7 g/dL (ref 6.5–8.1)

## 2017-11-28 LAB — PROTIME-INR
INR: 1.08
Prothrombin Time: 14 seconds (ref 11.4–15.2)

## 2017-11-28 LAB — HEMOGLOBIN A1C
Hgb A1c MFr Bld: 7.1 % — ABNORMAL HIGH (ref 4.8–5.6)
Mean Plasma Glucose: 157.07 mg/dL

## 2017-11-28 LAB — GLUCOSE, CAPILLARY: Glucose-Capillary: 101 mg/dL — ABNORMAL HIGH (ref 65–99)

## 2017-11-28 MED ORDER — POTASSIUM CHLORIDE CRYS ER 20 MEQ PO TBCR: EXTENDED_RELEASE_TABLET | ORAL | 0 refills | Status: DC

## 2017-11-28 NOTE — Progress Notes (Signed)
Patient unable to give urine sample at PAT appointment send urine cup home with  Patient and asked to bring back the morning of surgery

## 2017-11-28 NOTE — Progress Notes (Signed)
PCP - [facility] Cardiologist - denies  Chest x-ray - 09/26/17 EKG - 11/28/17 Stress Test - denies ECHO - 2014 Cardiac Cathdenies -    Fasting Blood Sugar - 120s Checks Blood Sugar ___2__ times a day   Aspirin Instructions: stop 5 days per office  Anesthesia review: NO  Patient denies shortness of breath, fever, cough and chest pain at PAT appointment   Patient verbalized understanding of instructions that were given to them at the PAT appointment. Patient was also instructed that they will need to review over the PAT instructions again at home before surgery.

## 2017-11-28 NOTE — Telephone Encounter (Signed)
Surgeon's office called Pre-op potassium was 2.7, sent in 36meq

## 2017-11-28 NOTE — Progress Notes (Signed)
Anesthesia Chart Review:  Patient is an 83 year old female scheduled for laparoscopic small bowel resection on 12/04/2017 with Stark Klein, M.D.  - PCP is Vic Blackbird, MD  PMH includes:  HTN, DM, hyperlipidemia, glaucoma.  Never smoker.  Medications include: Albuterol, ASA 81 mg, potassium, losartan-HCTZ, metformin, simvastatin  BP 133/78   Pulse 86   Temp 36.8 C   Resp 18   Ht 5' 4.5" (1.638 m)   Wt 106 lb 3.2 oz (48.2 kg)   SpO2 100%   BMI 17.95 kg/m   Preoperative labs reviewed.   - UA to be obtained day of surgery - HbA1c 7.1, glucose 107 - K 2.7.  Pre-admission testing RN notified Dr. Marlowe Aschoff office.  Will recheck K day of surgery.   CXR 09/26/17:  - Minimal linear scarring at the left base.  No active disease.  EKG 11/28/17: NSR  Carotid duplex 04/21/13:  - No significant carotid disease with antegrade flow and vertebrals  Echo 04/21/13:  1. Normal chamber size. 2. Normal LV systolic function. Moderate septal hypokinesis due to CAD. Mild LV hypertrophy with mild diastolic dysfunction. 3. Mild to moderate pulmonary regurgitation. 4. Trace tricuspid regurgitation.  5. Normal pulmonary artery pressure. 6. Trace mitral regurgitation. 7. Fiber calcified aortic valve without stenosis. 8. No pericardial effusion.  If labs acceptable day of surgery, I anticipate pt can proceed with surgery as scheduled.   Willeen Cass, FNP-BC [facility]/Anesthesiology Phone: [phone] 11/28/2017 2:36 PM

## 2017-11-28 NOTE — Progress Notes (Signed)
CRITICAL VALUE ALERT  Critical Value:  K 2.7  Date & Time Notied: 11/28/17 2:13 PM   Provider Notified: Levada Dy NP, Dr. Marlowe Aschoff office  Orders Received/Actions taken: office is calling patient for verification of previous KLOR medication ordered for patient - requested office to call me back

## 2017-11-30 LAB — CHROMOGRANIN A: CHROMOGRANIN A: 7 nmol/L — AB (ref 0–5)

## 2017-12-02 ENCOUNTER — Other Ambulatory Visit: Payer: Self-pay | Admitting: Family Medicine

## 2017-12-03 ENCOUNTER — Other Ambulatory Visit: Payer: Self-pay | Admitting: *Deleted

## 2017-12-03 ENCOUNTER — Other Ambulatory Visit: Payer: Medicare Other

## 2017-12-03 DIAGNOSIS — E7800 Pure hypercholesterolemia, unspecified: Secondary | ICD-10-CM

## 2017-12-03 MED ORDER — SIMVASTATIN 40 MG PO TABS: ORAL_TABLET | ORAL | 2 refills | Status: DC

## 2017-12-04 ENCOUNTER — Encounter (HOSPITAL_COMMUNITY)
Admission: RE | Disposition: A | Discharge status: Skilled Nursing Facility | Payer: Self-pay | Source: Ambulatory Visit | Attending: General Surgery

## 2017-12-04 ENCOUNTER — Inpatient Hospital Stay (HOSPITAL_COMMUNITY): Payer: Medicare Other | Admitting: Emergency Medicine

## 2017-12-04 ENCOUNTER — Encounter (HOSPITAL_COMMUNITY): Payer: Self-pay

## 2017-12-04 ENCOUNTER — Other Ambulatory Visit: Payer: Self-pay

## 2017-12-04 ENCOUNTER — Inpatient Hospital Stay (HOSPITAL_COMMUNITY)
Admission: RE | Admit: 2017-12-04 | Discharge: 2017-12-13 | DRG: 329 | Disposition: A | Discharge status: Skilled Nursing Facility | Payer: Medicare Other | Source: Ambulatory Visit | Attending: General Surgery | Admitting: General Surgery

## 2017-12-04 DIAGNOSIS — I700 Atherosclerosis of aorta: Secondary | ICD-10-CM | POA: Diagnosis present

## 2017-12-04 DIAGNOSIS — C7A019 Malignant carcinoid tumor of the small intestine, unspecified portion: Principal | ICD-10-CM | POA: Diagnosis present

## 2017-12-04 DIAGNOSIS — Z681 Body mass index (BMI) 19 or less, adult: Secondary | ICD-10-CM

## 2017-12-04 DIAGNOSIS — D649 Anemia, unspecified: Secondary | ICD-10-CM | POA: Diagnosis not present

## 2017-12-04 DIAGNOSIS — E1140 Type 2 diabetes mellitus with diabetic neuropathy, unspecified: Secondary | ICD-10-CM | POA: Diagnosis present

## 2017-12-04 DIAGNOSIS — K56609 Unspecified intestinal obstruction, unspecified as to partial versus complete obstruction: Secondary | ICD-10-CM | POA: Diagnosis not present

## 2017-12-04 DIAGNOSIS — Z5331 Laparoscopic surgical procedure converted to open procedure: Secondary | ICD-10-CM | POA: Diagnosis not present

## 2017-12-04 DIAGNOSIS — F339 Major depressive disorder, recurrent, unspecified: Secondary | ICD-10-CM | POA: Diagnosis not present

## 2017-12-04 DIAGNOSIS — F411 Generalized anxiety disorder: Secondary | ICD-10-CM | POA: Diagnosis not present

## 2017-12-04 DIAGNOSIS — E1149 Type 2 diabetes mellitus with other diabetic neurological complication: Secondary | ICD-10-CM | POA: Diagnosis not present

## 2017-12-04 DIAGNOSIS — H409 Unspecified glaucoma: Secondary | ICD-10-CM | POA: Diagnosis present

## 2017-12-04 DIAGNOSIS — E43 Unspecified severe protein-calorie malnutrition: Secondary | ICD-10-CM | POA: Diagnosis present

## 2017-12-04 DIAGNOSIS — C772 Secondary and unspecified malignant neoplasm of intra-abdominal lymph nodes: Secondary | ICD-10-CM | POA: Diagnosis present

## 2017-12-04 DIAGNOSIS — K5649 Other impaction of intestine: Secondary | ICD-10-CM | POA: Diagnosis not present

## 2017-12-04 DIAGNOSIS — E7800 Pure hypercholesterolemia, unspecified: Secondary | ICD-10-CM | POA: Diagnosis present

## 2017-12-04 DIAGNOSIS — M6281 Muscle weakness (generalized): Secondary | ICD-10-CM | POA: Diagnosis not present

## 2017-12-04 DIAGNOSIS — D3A8 Other benign neuroendocrine tumors: Secondary | ICD-10-CM | POA: Diagnosis not present

## 2017-12-04 DIAGNOSIS — E871 Hypo-osmolality and hyponatremia: Secondary | ICD-10-CM | POA: Diagnosis not present

## 2017-12-04 DIAGNOSIS — C7A8 Other malignant neuroendocrine tumors: Secondary | ICD-10-CM | POA: Diagnosis present

## 2017-12-04 DIAGNOSIS — C179 Malignant neoplasm of small intestine, unspecified: Secondary | ICD-10-CM | POA: Diagnosis not present

## 2017-12-04 DIAGNOSIS — M1280 Other specific arthropathies, not elsewhere classified, unspecified site: Secondary | ICD-10-CM | POA: Diagnosis not present

## 2017-12-04 DIAGNOSIS — K6389 Other specified diseases of intestine: Secondary | ICD-10-CM | POA: Diagnosis not present

## 2017-12-04 DIAGNOSIS — R100 Acute abdomen: Secondary | ICD-10-CM | POA: Diagnosis not present

## 2017-12-04 DIAGNOSIS — C7A1 Malignant poorly differentiated neuroendocrine tumors: Secondary | ICD-10-CM | POA: Diagnosis not present

## 2017-12-04 DIAGNOSIS — M810 Age-related osteoporosis without current pathological fracture: Secondary | ICD-10-CM | POA: Diagnosis not present

## 2017-12-04 DIAGNOSIS — I10 Essential (primary) hypertension: Secondary | ICD-10-CM | POA: Diagnosis present

## 2017-12-04 DIAGNOSIS — E7849 Other hyperlipidemia: Secondary | ICD-10-CM | POA: Diagnosis not present

## 2017-12-04 DIAGNOSIS — F419 Anxiety disorder, unspecified: Secondary | ICD-10-CM | POA: Diagnosis present

## 2017-12-04 DIAGNOSIS — C7B8 Other secondary neuroendocrine tumors: Secondary | ICD-10-CM | POA: Diagnosis not present

## 2017-12-04 HISTORY — PX: LAPAROSCOPY: SHX197

## 2017-12-04 HISTORY — PX: LAPAROSCOPIC SMALL BOWEL RESECTION: SHX5929

## 2017-12-04 HISTORY — PX: LAPAROSCOPIC SMALL BOWEL RESECTION: SUR793

## 2017-12-04 LAB — GLUCOSE, CAPILLARY
GLUCOSE-CAPILLARY: 232 mg/dL — AB (ref 65–99)
Glucose-Capillary: 226 mg/dL — ABNORMAL HIGH (ref 65–99)
Glucose-Capillary: 243 mg/dL — ABNORMAL HIGH (ref 65–99)
Glucose-Capillary: 257 mg/dL — ABNORMAL HIGH (ref 65–99)

## 2017-12-04 LAB — CBC
HCT: 33 % — ABNORMAL LOW (ref 36.0–46.0)
HEMOGLOBIN: 10.7 g/dL — AB (ref 12.0–15.0)
MCH: 27.2 pg (ref 26.0–34.0)
MCHC: 32.4 g/dL (ref 30.0–36.0)
MCV: 83.8 fL (ref 78.0–100.0)
PLATELETS: 250 10*3/uL (ref 150–400)
RBC: 3.94 MIL/uL (ref 3.87–5.11)
RDW: 15 % (ref 11.5–15.5)
WBC: 7.7 10*3/uL (ref 4.0–10.5)

## 2017-12-04 LAB — POCT I-STAT 4, (NA,K, GLUC, HGB,HCT)
Glucose, Bld: 269 mg/dL — ABNORMAL HIGH (ref 65–99)
HEMATOCRIT: 31 % — AB (ref 36.0–46.0)
HEMOGLOBIN: 10.5 g/dL — AB (ref 12.0–15.0)
Potassium: 3.3 mmol/L — ABNORMAL LOW (ref 3.5–5.1)
SODIUM: 136 mmol/L (ref 135–145)

## 2017-12-04 LAB — CREATININE, SERUM
CREATININE: 0.9 mg/dL (ref 0.44–1.00)
GFR calc Af Amer: >60 mL/min (ref >60)
GFR, EST NON AFRICAN AMERICAN: 57 mL/min — AB (ref >60)

## 2017-12-04 LAB — URINALYSIS, ROUTINE W REFLEX MICROSCOPIC
BILIRUBIN URINE: NEGATIVE
Glucose, UA: NEGATIVE mg/dL
Ketones, ur: 80 mg/dL — AB
Nitrite: NEGATIVE
PROTEIN: 30 mg/dL — AB
Specific Gravity, Urine: 1.033 — ABNORMAL HIGH (ref 1.005–1.030)
pH: 5 (ref 5.0–8.0)

## 2017-12-04 SURGERY — EXCISION, SMALL INTESTINE, LAPAROSCOPIC
Anesthesia: General | Site: Abdomen

## 2017-12-04 MED ORDER — ACETAMINOPHEN 325 MG PO TABS
650.0000 mg | ORAL_TABLET | Freq: Four times a day (QID) | ORAL | Status: DC | PRN
  Administered 2017-12-05: 650 mg via ORAL
  Filled 2017-12-04: qty 2

## 2017-12-04 MED ORDER — ACETAMINOPHEN 500 MG PO TABS
1000.0000 mg | ORAL_TABLET | ORAL | Status: AC
  Administered 2017-12-04: 1000 mg via ORAL

## 2017-12-04 MED ORDER — DEXMEDETOMIDINE HCL IN NACL 200 MCG/50ML IV SOLN
INTRAVENOUS | Status: AC
  Filled 2017-12-04: qty 50

## 2017-12-04 MED ORDER — SUGAMMADEX SODIUM 200 MG/2ML IV SOLN
INTRAVENOUS | Status: DC | PRN
Start: 2017-12-04 — End: 2017-12-04
  Administered 2017-12-04: 200 mg via INTRAVENOUS

## 2017-12-04 MED ORDER — NALOXONE HCL 0.4 MG/ML IJ SOLN: 0.4000 mg | INTRAMUSCULAR | Status: DC | PRN

## 2017-12-04 MED ORDER — LIDOCAINE 2% (20 MG/ML) 5 ML SYRINGE
INTRAMUSCULAR | Status: AC
  Filled 2017-12-04: qty 10

## 2017-12-04 MED ORDER — LIDOCAINE 2% (20 MG/ML) 5 ML SYRINGE
INTRAMUSCULAR | Status: DC | PRN
  Administered 2017-12-04: 1.5 mg/kg/h via INTRAVENOUS
  Administered 2017-12-04: 09:00:00 via INTRAVENOUS

## 2017-12-04 MED ORDER — PHENYLEPHRINE 40 MCG/ML (10ML) SYRINGE FOR IV PUSH (FOR BLOOD PRESSURE SUPPORT)
PREFILLED_SYRINGE | INTRAVENOUS | Status: DC | PRN
  Administered 2017-12-04: 80 ug via INTRAVENOUS
  Administered 2017-12-04: 120 ug via INTRAVENOUS
  Administered 2017-12-04: 80 ug via INTRAVENOUS

## 2017-12-04 MED ORDER — KETAMINE HCL-SODIUM CHLORIDE 100-0.9 MG/10ML-% IV SOSY
PREFILLED_SYRINGE | INTRAVENOUS | Status: AC
  Filled 2017-12-04: qty 10

## 2017-12-04 MED ORDER — ENOXAPARIN SODIUM 40 MG/0.4ML ~~LOC~~ SOLN
40.0000 mg | SUBCUTANEOUS | Status: DC
  Administered 2017-12-05 – 2017-12-13 (×9): 40 mg via SUBCUTANEOUS
  Filled 2017-12-04 (×9): qty 0.4

## 2017-12-04 MED ORDER — SUGAMMADEX SODIUM 200 MG/2ML IV SOLN
INTRAVENOUS | Status: AC
  Filled 2017-12-04: qty 2

## 2017-12-04 MED ORDER — ONDANSETRON HCL 4 MG/2ML IJ SOLN
4.0000 mg | Freq: Four times a day (QID) | INTRAMUSCULAR | Status: DC | PRN
  Administered 2017-12-06 – 2017-12-10 (×3): 4 mg via INTRAVENOUS
  Filled 2017-12-04 (×4): qty 2

## 2017-12-04 MED ORDER — FLUTICASONE PROPIONATE 50 MCG/ACT NA SUSP
2.0000 | Freq: Every day | NASAL | Status: DC
  Administered 2017-12-05 – 2017-12-13 (×9): 2 via NASAL
  Filled 2017-12-04 (×2): qty 16

## 2017-12-04 MED ORDER — ACETAMINOPHEN 650 MG RE SUPP: 650.0000 mg | Freq: Four times a day (QID) | RECTAL | Status: DC | PRN

## 2017-12-04 MED ORDER — ROCURONIUM BROMIDE 100 MG/10ML IV SOLN
INTRAVENOUS | Status: DC | PRN
  Administered 2017-12-04 (×2): 10 mg via INTRAVENOUS
  Administered 2017-12-04: 50 mg via INTRAVENOUS

## 2017-12-04 MED ORDER — FENTANYL CITRATE (PF) 250 MCG/5ML IJ SOLN
INTRAMUSCULAR | Status: AC
  Filled 2017-12-04: qty 5

## 2017-12-04 MED ORDER — MEPERIDINE HCL 25 MG/ML IJ SOLN: 6.2500 mg | INTRAMUSCULAR | Status: DC | PRN

## 2017-12-04 MED ORDER — LIDOCAINE 2% (20 MG/ML) 5 ML SYRINGE
INTRAMUSCULAR | Status: AC
  Filled 2017-12-04: qty 5

## 2017-12-04 MED ORDER — SIMETHICONE 80 MG PO CHEW: 40.0000 mg | CHEWABLE_TABLET | Freq: Four times a day (QID) | ORAL | Status: DC | PRN

## 2017-12-04 MED ORDER — HYDRALAZINE HCL 20 MG/ML IJ SOLN: 10.0000 mg | INTRAMUSCULAR | Status: DC | PRN

## 2017-12-04 MED ORDER — KETOROLAC TROMETHAMINE 15 MG/ML IJ SOLN: 15.0000 mg | Freq: Four times a day (QID) | INTRAMUSCULAR | Status: AC | PRN

## 2017-12-04 MED ORDER — ALVIMOPAN 12 MG PO CAPS
ORAL_CAPSULE | ORAL | Status: AC
  Administered 2017-12-04: 12 mg via ORAL
  Filled 2017-12-04: qty 1

## 2017-12-04 MED ORDER — HYDROMORPHONE HCL 1 MG/ML IJ SOLN: 0.2500 mg | INTRAMUSCULAR | Status: DC | PRN

## 2017-12-04 MED ORDER — KETOROLAC TROMETHAMINE 15 MG/ML IJ SOLN
INTRAMUSCULAR | Status: AC
  Filled 2017-12-04: qty 1

## 2017-12-04 MED ORDER — SUCCINYLCHOLINE CHLORIDE 200 MG/10ML IV SOSY
PREFILLED_SYRINGE | INTRAVENOUS | Status: AC
  Filled 2017-12-04: qty 10

## 2017-12-04 MED ORDER — INSULIN ASPART 100 UNIT/ML ~~LOC~~ SOLN
0.0000 [IU] | Freq: Three times a day (TID) | SUBCUTANEOUS | Status: DC
  Administered 2017-12-04: 3 [IU] via SUBCUTANEOUS
  Administered 2017-12-05: 2 [IU] via SUBCUTANEOUS
  Administered 2017-12-05 (×2): 5 [IU] via SUBCUTANEOUS
  Administered 2017-12-06 (×3): 3 [IU] via SUBCUTANEOUS
  Administered 2017-12-07: 5 [IU] via SUBCUTANEOUS
  Administered 2017-12-07 (×2): 3 [IU] via SUBCUTANEOUS
  Administered 2017-12-08: 5 [IU] via SUBCUTANEOUS
  Administered 2017-12-08: 2 [IU] via SUBCUTANEOUS
  Administered 2017-12-08: 5 [IU] via SUBCUTANEOUS
  Administered 2017-12-09: 2 [IU] via SUBCUTANEOUS
  Administered 2017-12-09: 3 [IU] via SUBCUTANEOUS
  Administered 2017-12-10: 2 [IU] via SUBCUTANEOUS
  Administered 2017-12-10 (×2): 3 [IU] via SUBCUTANEOUS
  Administered 2017-12-11: 2 [IU] via SUBCUTANEOUS
  Administered 2017-12-11 (×2): 1 [IU] via SUBCUTANEOUS
  Administered 2017-12-12 (×3): 2 [IU] via SUBCUTANEOUS
  Administered 2017-12-13: 1 [IU] via SUBCUTANEOUS

## 2017-12-04 MED ORDER — ALVIMOPAN 12 MG PO CAPS
12.0000 mg | ORAL_CAPSULE | ORAL | Status: AC
  Administered 2017-12-04: 12 mg via ORAL

## 2017-12-04 MED ORDER — GABAPENTIN 100 MG PO CAPS
200.0000 mg | ORAL_CAPSULE | Freq: Two times a day (BID) | ORAL | Status: DC
  Administered 2017-12-04 – 2017-12-13 (×17): 200 mg via ORAL
  Filled 2017-12-04 (×18): qty 2

## 2017-12-04 MED ORDER — CEFAZOLIN SODIUM-DEXTROSE 2-4 GM/100ML-% IV SOLN
2.0000 g | INTRAVENOUS | Status: AC
  Administered 2017-12-04: 2 g via INTRAVENOUS

## 2017-12-04 MED ORDER — DEXAMETHASONE SODIUM PHOSPHATE 10 MG/ML IJ SOLN
INTRAMUSCULAR | Status: DC | PRN
  Administered 2017-12-04: 4 mg via INTRAVENOUS

## 2017-12-04 MED ORDER — CEFAZOLIN SODIUM-DEXTROSE 2-4 GM/100ML-% IV SOLN
2.0000 g | Freq: Three times a day (TID) | INTRAVENOUS | Status: AC
  Administered 2017-12-04: 2 g via INTRAVENOUS
  Filled 2017-12-04: qty 100

## 2017-12-04 MED ORDER — MIRTAZAPINE 7.5 MG PO TABS
7.5000 mg | ORAL_TABLET | Freq: Every day | ORAL | Status: DC
  Administered 2017-12-04 – 2017-12-12 (×8): 7.5 mg via ORAL
  Filled 2017-12-04 (×10): qty 1

## 2017-12-04 MED ORDER — LACTATED RINGERS IV SOLN
INTRAVENOUS | Status: DC | PRN
  Administered 2017-12-04: 07:00:00 via INTRAVENOUS

## 2017-12-04 MED ORDER — BUPIVACAINE-EPINEPHRINE (PF) 0.25% -1:200000 IJ SOLN
INTRAMUSCULAR | Status: DC | PRN
  Administered 2017-12-04: 40 mL via INTRAMUSCULAR

## 2017-12-04 MED ORDER — OXYCODONE HCL 5 MG/5ML PO SOLN: 5.0000 mg | Freq: Once | ORAL | Status: DC | PRN

## 2017-12-04 MED ORDER — LIDOCAINE HCL (CARDIAC) 20 MG/ML IV SOLN
INTRAVENOUS | Status: DC | PRN
  Administered 2017-12-04: 60 mg via INTRAVENOUS

## 2017-12-04 MED ORDER — DIPHENHYDRAMINE HCL 50 MG/ML IJ SOLN: 12.5000 mg | Freq: Four times a day (QID) | INTRAMUSCULAR | Status: DC | PRN

## 2017-12-04 MED ORDER — DIPHENHYDRAMINE HCL 12.5 MG/5ML PO ELIX: 12.5000 mg | ORAL_SOLUTION | Freq: Four times a day (QID) | ORAL | Status: DC | PRN

## 2017-12-04 MED ORDER — ALBUTEROL SULFATE (2.5 MG/3ML) 0.083% IN NEBU: 2.5000 mg | INHALATION_SOLUTION | Freq: Four times a day (QID) | RESPIRATORY_TRACT | Status: DC | PRN

## 2017-12-04 MED ORDER — DORZOLAMIDE HCL 2 % OP SOLN
1.0000 [drp] | Freq: Two times a day (BID) | OPHTHALMIC | Status: DC
  Administered 2017-12-04 – 2017-12-13 (×17): 1 [drp] via OPHTHALMIC
  Filled 2017-12-04: qty 10

## 2017-12-04 MED ORDER — BUPIVACAINE-EPINEPHRINE (PF) 0.25% -1:200000 IJ SOLN
INTRAMUSCULAR | Status: AC
  Filled 2017-12-04: qty 30

## 2017-12-04 MED ORDER — PHENYLEPHRINE 40 MCG/ML (10ML) SYRINGE FOR IV PUSH (FOR BLOOD PRESSURE SUPPORT)
PREFILLED_SYRINGE | INTRAVENOUS | Status: AC
  Filled 2017-12-04: qty 10

## 2017-12-04 MED ORDER — KCL IN DEXTROSE-NACL 40-5-0.45 MEQ/L-%-% IV SOLN
INTRAVENOUS | Status: DC
  Administered 2017-12-04 – 2017-12-13 (×16): via INTRAVENOUS
  Filled 2017-12-04 (×18): qty 1000

## 2017-12-04 MED ORDER — ONDANSETRON HCL 4 MG/2ML IJ SOLN: 4.0000 mg | Freq: Four times a day (QID) | INTRAMUSCULAR | Status: DC | PRN

## 2017-12-04 MED ORDER — PROMETHAZINE HCL 25 MG/ML IJ SOLN: 6.2500 mg | INTRAMUSCULAR | Status: DC | PRN

## 2017-12-04 MED ORDER — KETAMINE HCL 10 MG/ML IJ SOLN
INTRAMUSCULAR | Status: DC | PRN
  Administered 2017-12-04: 10 mg via INTRAVENOUS
  Administered 2017-12-04: 25 mg via INTRAVENOUS

## 2017-12-04 MED ORDER — DEXAMETHASONE SODIUM PHOSPHATE 10 MG/ML IJ SOLN
INTRAMUSCULAR | Status: AC
  Filled 2017-12-04: qty 1

## 2017-12-04 MED ORDER — ACETAMINOPHEN 500 MG PO TABS
ORAL_TABLET | ORAL | Status: AC
  Administered 2017-12-04: 1000 mg via ORAL
  Filled 2017-12-04: qty 2

## 2017-12-04 MED ORDER — LIDOCAINE HCL (PF) 1 % IJ SOLN
INTRAMUSCULAR | Status: AC
  Filled 2017-12-04: qty 30

## 2017-12-04 MED ORDER — ONDANSETRON HCL 4 MG/2ML IJ SOLN
INTRAMUSCULAR | Status: DC | PRN
  Administered 2017-12-04: 4 mg via INTRAVENOUS

## 2017-12-04 MED ORDER — OXYCODONE HCL 5 MG PO TABS: 5.0000 mg | ORAL_TABLET | Freq: Once | ORAL | Status: DC | PRN

## 2017-12-04 MED ORDER — GABAPENTIN 300 MG PO CAPS
ORAL_CAPSULE | ORAL | Status: AC
  Filled 2017-12-04: qty 1

## 2017-12-04 MED ORDER — ALBUTEROL SULFATE HFA 108 (90 BASE) MCG/ACT IN AERS: 2.0000 | INHALATION_SPRAY | Freq: Four times a day (QID) | RESPIRATORY_TRACT | Status: DC | PRN

## 2017-12-04 MED ORDER — CHLORHEXIDINE GLUCONATE CLOTH 2 % EX PADS: 6.0000 | MEDICATED_PAD | Freq: Once | CUTANEOUS | Status: DC

## 2017-12-04 MED ORDER — FENTANYL CITRATE (PF) 100 MCG/2ML IJ SOLN
INTRAMUSCULAR | Status: DC | PRN
  Administered 2017-12-04: 50 ug via INTRAVENOUS
  Administered 2017-12-04: 12.5 ug via INTRAVENOUS
  Administered 2017-12-04 (×2): 25 ug via INTRAVENOUS
  Administered 2017-12-04: 12.5 ug via INTRAVENOUS
  Administered 2017-12-04: 50 ug via INTRAVENOUS

## 2017-12-04 MED ORDER — KETOROLAC TROMETHAMINE 15 MG/ML IJ SOLN
15.0000 mg | Freq: Four times a day (QID) | INTRAMUSCULAR | Status: AC
  Administered 2017-12-04 – 2017-12-05 (×3): 15 mg via INTRAVENOUS
  Filled 2017-12-04 (×2): qty 1

## 2017-12-04 MED ORDER — GABAPENTIN 100 MG PO CAPS
200.0000 mg | ORAL_CAPSULE | ORAL | Status: AC
  Administered 2017-12-04: 200 mg via ORAL
  Filled 2017-12-04: qty 2

## 2017-12-04 MED ORDER — 0.9 % SODIUM CHLORIDE (POUR BTL) OPTIME
TOPICAL | Status: DC | PRN
  Administered 2017-12-04: 1000 mL

## 2017-12-04 MED ORDER — EPHEDRINE 5 MG/ML INJ
INTRAVENOUS | Status: AC
  Filled 2017-12-04: qty 10

## 2017-12-04 MED ORDER — ONDANSETRON 4 MG PO TBDP
4.0000 mg | ORAL_TABLET | Freq: Four times a day (QID) | ORAL | Status: DC | PRN
  Administered 2017-12-13: 4 mg via ORAL
  Filled 2017-12-04: qty 1

## 2017-12-04 MED ORDER — PROPOFOL 10 MG/ML IV BOLUS
INTRAVENOUS | Status: AC
  Filled 2017-12-04: qty 20

## 2017-12-04 MED ORDER — ESCITALOPRAM OXALATE 10 MG PO TABS
10.0000 mg | ORAL_TABLET | Freq: Every day | ORAL | Status: DC
  Administered 2017-12-04 – 2017-12-12 (×8): 10 mg via ORAL
  Filled 2017-12-04 (×9): qty 1

## 2017-12-04 MED ORDER — LATANOPROST 0.005 % OP SOLN
1.0000 [drp] | Freq: Every day | OPHTHALMIC | Status: DC
  Administered 2017-12-04 – 2017-12-12 (×8): 1 [drp] via OPHTHALMIC
  Filled 2017-12-04: qty 2.5

## 2017-12-04 MED ORDER — STERILE WATER FOR IRRIGATION IR SOLN
Status: DC | PRN
  Administered 2017-12-04 (×2): 1000 mL

## 2017-12-04 MED ORDER — DEXMEDETOMIDINE HCL 200 MCG/2ML IV SOLN
INTRAVENOUS | Status: DC | PRN
  Administered 2017-12-04: 4 ug via INTRAVENOUS

## 2017-12-04 MED ORDER — CEFAZOLIN SODIUM-DEXTROSE 2-4 GM/100ML-% IV SOLN
INTRAVENOUS | Status: AC
  Filled 2017-12-04: qty 100

## 2017-12-04 MED ORDER — SODIUM CHLORIDE 0.9% FLUSH: 9.0000 mL | INTRAVENOUS | Status: DC | PRN

## 2017-12-04 MED ORDER — PROPOFOL 10 MG/ML IV BOLUS
INTRAVENOUS | Status: DC | PRN
  Administered 2017-12-04: 120 mg via INTRAVENOUS

## 2017-12-04 MED ORDER — MORPHINE SULFATE 2 MG/ML IV SOLN
INTRAVENOUS | Status: DC
  Administered 2017-12-04: 11:00:00 via INTRAVENOUS
  Filled 2017-12-04: qty 30

## 2017-12-04 MED ORDER — ROCURONIUM BROMIDE 10 MG/ML (PF) SYRINGE
PREFILLED_SYRINGE | INTRAVENOUS | Status: AC
  Filled 2017-12-04: qty 5

## 2017-12-04 MED ORDER — METHOCARBAMOL 500 MG PO TABS
500.0000 mg | ORAL_TABLET | Freq: Four times a day (QID) | ORAL | Status: DC | PRN
  Administered 2017-12-09: 500 mg via ORAL
  Filled 2017-12-04: qty 1

## 2017-12-04 MED ORDER — ONDANSETRON HCL 4 MG/2ML IJ SOLN
INTRAMUSCULAR | Status: AC
  Filled 2017-12-04: qty 2

## 2017-12-04 SURGICAL SUPPLY — 82 items
APPLIER CLIP 5 13 M/L LIGAMAX5 (MISCELLANEOUS)
APPLIER CLIP ROT 10 11.4 M/L (STAPLE)
BLADE CLIPPER SURG (BLADE) IMPLANT
CANISTER SUCT 3000ML PPV (MISCELLANEOUS) ×3 IMPLANT
CELLS DAT CNTRL 66122 CELL SVR (MISCELLANEOUS) IMPLANT
CHLORAPREP W/TINT 26ML (MISCELLANEOUS) ×3 IMPLANT
CLIP APPLIE 5 13 M/L LIGAMAX5 (MISCELLANEOUS) IMPLANT
CLIP APPLIE ROT 10 11.4 M/L (STAPLE) IMPLANT
COVER MAYO STAND STRL (DRAPES) ×3 IMPLANT
COVER SURGICAL LIGHT HANDLE (MISCELLANEOUS) ×6 IMPLANT
DISSECTOR BLUNT TIP ENDO 5MM (MISCELLANEOUS) IMPLANT
DRAPE HALF SHEET 40X57 (DRAPES) ×3 IMPLANT
DRAPE UTILITY XL STRL (DRAPES) IMPLANT
DRAPE WARM FLUID 44X44 (DRAPE) ×3 IMPLANT
DRSG COVADERM 4X8 (GAUZE/BANDAGES/DRESSINGS) ×3 IMPLANT
DRSG OPSITE POSTOP 4X10 (GAUZE/BANDAGES/DRESSINGS) IMPLANT
DRSG OPSITE POSTOP 4X8 (GAUZE/BANDAGES/DRESSINGS) IMPLANT
ELECT BLADE 6.5 EXT (BLADE) ×3 IMPLANT
ELECT CAUTERY BLADE 6.4 (BLADE) ×9 IMPLANT
ELECT REM PT RETURN 9FT ADLT (ELECTROSURGICAL) ×3
ELECTRODE REM PT RTRN 9FT ADLT (ELECTROSURGICAL) ×1 IMPLANT
GEL ULTRASOUND 20GR AQUASONIC (MISCELLANEOUS) IMPLANT
GLOVE BIO SURGEON STRL SZ 6 (GLOVE) ×6 IMPLANT
GLOVE BIO SURGEON STRL SZ7.5 (GLOVE) ×3 IMPLANT
GLOVE BIOGEL PI IND STRL 6.5 (GLOVE) ×2 IMPLANT
GLOVE BIOGEL PI IND STRL 7.5 (GLOVE) ×1 IMPLANT
GLOVE BIOGEL PI INDICATOR 6.5 (GLOVE) ×4
GLOVE BIOGEL PI INDICATOR 7.5 (GLOVE) ×2
GOWN STRL REUS W/ TWL LRG LVL3 (GOWN DISPOSABLE) ×6 IMPLANT
GOWN STRL REUS W/TWL 2XL LVL3 (GOWN DISPOSABLE) ×6 IMPLANT
GOWN STRL REUS W/TWL LRG LVL3 (GOWN DISPOSABLE) ×12
KIT BASIN OR (CUSTOM PROCEDURE TRAY) ×3 IMPLANT
KIT ROOM TURNOVER OR (KITS) ×3 IMPLANT
L-HOOK LAP DISP 36CM (ELECTROSURGICAL) ×3
LEGGING LITHOTOMY PAIR STRL (DRAPES) IMPLANT
LHOOK LAP DISP 36CM (ELECTROSURGICAL) ×1 IMPLANT
LIGASURE IMPACT 36 18CM CVD LR (INSTRUMENTS) IMPLANT
LIGASURE MARYLAND LAP STAND (ELECTROSURGICAL) IMPLANT
NS IRRIG 1000ML POUR BTL (IV SOLUTION) ×9 IMPLANT
PAD ARMBOARD 7.5X6 YLW CONV (MISCELLANEOUS) ×6 IMPLANT
PENCIL BUTTON HOLSTER BLD 10FT (ELECTRODE) ×9 IMPLANT
RELOAD PROXIMATE 75MM BLUE (ENDOMECHANICALS) ×6 IMPLANT
RTRCTR WOUND ALEXIS 18CM MED (MISCELLANEOUS)
SCISSORS LAP 5X35 DISP (ENDOMECHANICALS) ×3 IMPLANT
SET IRRIG TUBING LAPAROSCOPIC (IRRIGATION / IRRIGATOR) ×3 IMPLANT
SHEARS HARMONIC ACE PLUS 36CM (ENDOMECHANICALS) ×3 IMPLANT
SLEEVE ENDOPATH XCEL 5M (ENDOMECHANICALS) ×3 IMPLANT
SPECIMEN JAR LARGE (MISCELLANEOUS) ×3 IMPLANT
SPONGE LAP 18X18 X RAY DECT (DISPOSABLE) ×3 IMPLANT
STAPLER GUN LINEAR PROX 60 (STAPLE) ×3 IMPLANT
STAPLER PROXIMATE 75MM BLUE (STAPLE) ×3 IMPLANT
STAPLER VISISTAT 35W (STAPLE) ×6 IMPLANT
SUCTION POOLE TIP (SUCTIONS) ×3 IMPLANT
SURGILUBE 2OZ TUBE FLIPTOP (MISCELLANEOUS) IMPLANT
SUT PDS AB 1 CT  36 (SUTURE)
SUT PDS AB 1 CT 36 (SUTURE) IMPLANT
SUT PDS II 0 TP-1 LOOPED 60 (SUTURE) ×6 IMPLANT
SUT PROLENE 2 0 CT2 30 (SUTURE) IMPLANT
SUT PROLENE 2 0 KS (SUTURE) IMPLANT
SUT VIC AB 2-0 SH 18 (SUTURE) ×6 IMPLANT
SUT VIC AB 2-0 SH 27 (SUTURE) ×4
SUT VIC AB 2-0 SH 27XBRD (SUTURE) ×2 IMPLANT
SUT VIC AB 3-0 SH 18 (SUTURE) ×3 IMPLANT
SUT VICRYL AB 2 0 TIES (SUTURE) ×3 IMPLANT
SUT VICRYL AB 3 0 TIES (SUTURE) ×3 IMPLANT
SYR BULB IRRIGATION 50ML (SYRINGE) ×3 IMPLANT
SYS LAPSCP GELPORT 120MM (MISCELLANEOUS)
SYSTEM LAPSCP GELPORT 120MM (MISCELLANEOUS) IMPLANT
TOWEL OR 17X26 10 PK STRL BLUE (TOWEL DISPOSABLE) ×6 IMPLANT
TRAY FOLEY W/METER SILVER 16FR (SET/KITS/TRAYS/PACK) ×3 IMPLANT
TRAY LAPAROSCOPIC MC (CUSTOM PROCEDURE TRAY) ×3 IMPLANT
TRAY PROCTOSCOPIC FIBER OPTIC (SET/KITS/TRAYS/PACK) IMPLANT
TROCAR XCEL 12X100 BLDLESS (ENDOMECHANICALS) IMPLANT
TROCAR XCEL BLUNT TIP 100MML (ENDOMECHANICALS) IMPLANT
TROCAR XCEL NON-BLD 11X100MML (ENDOMECHANICALS) IMPLANT
TROCAR XCEL NON-BLD 5MMX100MML (ENDOMECHANICALS) ×3 IMPLANT
TUBE CONNECTING 12'X1/4 (SUCTIONS) ×2
TUBE CONNECTING 12X1/4 (SUCTIONS) ×4 IMPLANT
TUBING INSUF HEATED (TUBING) IMPLANT
TUBING INSUFFLATION (TUBING) ×3 IMPLANT
WATER STERILE IRR 1000ML POUR (IV SOLUTION) ×6 IMPLANT
YANKAUER SUCT BULB TIP NO VENT (SUCTIONS) ×6 IMPLANT

## 2017-12-04 NOTE — Anesthesia Postprocedure Evaluation (Signed)
Anesthesia Post Note  Patient: Theresa Howe  Procedure(s) Performed: LAPAROSCOPIC SMALL BOWEL RESECTION (N/A Abdomen) LAPAROSCOPY DIAGNOSTIC (N/A Abdomen)     Patient location during evaluation: PACU Anesthesia Type: General Level of consciousness: awake and alert Pain management: pain level controlled Vital Signs Assessment: post-procedure vital signs reviewed and stable Respiratory status: spontaneous breathing, nonlabored ventilation and respiratory function stable Cardiovascular status: blood pressure returned to baseline and stable Postop Assessment: no apparent nausea or vomiting Anesthetic complications: no    Last Vitals:  Vitals:   12/04/17 1115 12/04/17 1130  BP: (!) 97/57 119/65  Pulse: 80 80  Resp: 16 (!) 22  Temp:  (!) 36.3 C  SpO2: 100% 99%    Last Pain:  Vitals:   12/04/17 1130  TempSrc:   PainSc: [place]

## 2017-12-04 NOTE — Discharge Instructions (Signed)
[facility] °[phone] ° °ABDOMINAL SURGERY: POST OP INSTRUCTIONS ° °Always review your discharge instruction sheet given to you by the facility where your surgery was performed. ° °IF YOU HAVE DISABILITY OR FAMILY LEAVE FORMS, YOU MUST BRING THEM TO THE OFFICE FOR PROCESSING.  PLEASE DO NOT GIVE THEM TO YOUR DOCTOR. ° °1. A prescription for pain medication may be given to you upon discharge.  Take your pain medication as prescribed, if needed.  If narcotic pain medicine is not needed, then you may take acetaminophen (Tylenol) or ibuprofen (Advil) as needed. °2. Take your usually prescribed medications unless otherwise directed. °3. If you need a refill on your pain medication, please contact your pharmacy. They will contact our office to request authorization.  Prescriptions will not be filled after 5pm or on week-ends. °4. You should follow a light diet the first few days after arrival home, such as soup and crackers, pudding, etc.unless your doctor has advised otherwise. A high-fiber, low fat diet can be resumed as tolerated.   Be sure to include lots of fluids daily. Most patients will experience some swelling and bruising on the chest and neck area.  Ice packs will help.  Swelling and bruising can take several days to resolve °5. Most patients will experience some swelling and bruising in the area of the incision. Ice pack will help. Swelling and bruising can take several days to resolve..  °6. It is common to experience some constipation if taking pain medication after surgery.  Increasing fluid intake and taking a stool softener will usually help or prevent this problem from occurring.  A mild laxative (Milk of Magnesia or Miralax) should be taken according to package directions if there are no bowel movements after 48 hours. °7.  You may have steri-strips (small skin tapes) in place directly over the incision.  These strips should be left on the skin for 10-14 days.  If your  surgeon used skin glue on the incision, you may shower in 48 hours.  The glue will flake off over the next 2-3 weeks.  Any sutures or staples will be removed at the office during your follow-up visit. You may find that a light gauze bandage over your incision may keep your staples from being rubbed or pulled. You may shower and replace the bandage daily. °8. ACTIVITIES:  You may resume regular (light) daily activities beginning the next day--such as daily self-care, walking, climbing stairs--gradually increasing activities as tolerated.  You may have sexual intercourse when it is comfortable.  Refrain from any heavy lifting or straining until approved by your doctor. °a. You may drive when you no longer are taking prescription pain medication, you can comfortably wear a seatbelt, and you can safely maneuver your car and apply brakes °b. Return to Work: __________8 weeks if applicable_________________________ °9. You should see your doctor in the office for a follow-up appointment approximately two weeks after your surgery.  Make sure that you call for this appointment within a day or two after you arrive home to insure a convenient appointment time. °OTHER INSTRUCTIONS:  °_____________________________________________________________ °_____________________________________________________________ ° °WHEN TO CALL YOUR DOCTOR: °1. Fever over 101.0 °2. Inability to urinate °3. Nausea and/or vomiting °4. Extreme swelling or bruising °5. Continued bleeding from incision. °6. Increased pain, redness, or drainage from the incision. °7. Difficulty swallowing or breathing °8. Muscle cramping or spasms. °9. Numbness or tingling in hands or feet or around lips. ° °The clinic staff is   available to answer your questions during regular business hours.  Please don’t hesitate to call and ask to speak to one of the nurses if you have concerns. ° °For further questions, please visit [email or web address] ° ° ° °

## 2017-12-04 NOTE — Transfer of Care (Signed)
Immediate Anesthesia Transfer of Care Note  Patient: Theresa Howe  Procedure(s) Performed: LAPAROSCOPIC SMALL BOWEL RESECTION (N/A Abdomen) LAPAROSCOPY DIAGNOSTIC (N/A Abdomen)  Patient Location: PACU  Anesthesia Type:General  Level of Consciousness: awake, alert  and oriented  Airway & Oxygen Therapy: Patient Spontanous Breathing and Patient connected to face mask oxygen  Post-op Assessment: Report given to RN and Post -op Vital signs reviewed and stable  Post vital signs: Reviewed and stable  Last Vitals:  Vitals:   12/04/17 0628  BP: (!) 126/58  Pulse: 86  Resp: 18  Temp: 37 C  SpO2: 100%    Last Pain:  Vitals:   12/04/17 0628  TempSrc: Oral      Patients Stated Pain Goal: 3 (76/22/63 3354)  Complications: No apparent anesthesia complications

## 2017-12-04 NOTE — Op Note (Signed)
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  FINDINGS: Obstructing small bowel mass tethered in the pelvis, large mesenteric mass, mesenteric and peritoneal implants, liver implants.  EBL: 50 mLs  PROCEDURE:  Patient was identified in the holding area and taken to the operating room where she was placed supine on the operating room table.  General endotracheal anesthesia was induced.  A Foley catheter was placed and her arms were tucked.    Her abdomen was prepped and draped in sterile fashion.  A timeout was performed according to the surgical safety checklist.  When all was correct, we continued.  The patient was placed into reverse Trendelenburg position and rotated to the right.  Local anesthetic was administered at the costal margin on the left and a 6 mm incision was made with a #11 blade.  A 5 mm Optiview trocar was placed under direct visualization.  The abdomen was insufflated to a pressure of 15 mmHg.  The patient was then placed into Trendelenburg position.  Two additional 5 mm trocars were placed in the left abdomen after administration of local.  The small bowel was attempted to be run. There was a loop that was resistant to staying in the upper abdomen.  An additional 5 mm trocar was placed in the right abdomen to assist with retraction.  It was then apparent that the loop of  small bowel was tethered in the pelvis and that area was not able to be visualized.  Decision was made to convert to open.  The previous midline incision was opened with a #10 blade.  This was all infraumbilical.  The subcutaneous tissues were divided with the cautery.  A wound protector was placed in the abdomen.  The small bowel was partially eviscerated.  There were numerous peritoneal and mesenteric implants visible.   Palpation confirmed several superficial liver lesions on the right liver and along the right falciform.  There was a partial obstruction however from the mass as demonstrated by significant change in bowel caliber from before the mass to after the mass.  The small bowel was sharply dissected from the pelvis where it was adherent to the left fallopian tube.  The small bowel was divided proximal to the mass approximately 20 cm.  The mesentery was divided with a combination of the harmonic scalpel and clamps and ties.  The mesenteric mass was identified in the mesentery was divided proximal to the presumed lymph node conglomerate.  Distally, the small bowel was divided past the mass then past the area that had been tethered in the pelvis.  GIA-75 stapler was used.  The proximal mesentery was suture ligated to minimize risk of bleeding.  A side-to-side, functional end-to-end anastomosis was created using a GIA-75 stapler and the TX 60 stapler.  The apex of the anastomosis was reinforced with 3-0 Vicryl.  The mesenteric defect was closed using running 2-0 Vicryl.  The bowel was placed back into the  abdomen and the abdomen was irrigated with saline and then with water.  The bowel at the anastamosis was well perfused.  The peritoneum was closed using running 0 Vicryl.  The fascia was closed using running 0 looped PDS suture.  The skin of all the incisions was closed with staples.  The abdomen was then cleaned, dried, and dressed with dry sterile dressings.  The patient was allowed to emerge from  anesthesia and was taken to the PACU in stable condition.  Needle, sponge, and instrument counts were correct x2.

## 2017-12-04 NOTE — Anesthesia Procedure Notes (Signed)
Procedure Name: Intubation Date/Time: 12/04/2017 7:50 AM Performed by: Lynda Rainwater, MD Pre-anesthesia Checklist: Patient identified, Emergency Drugs available and Patient being monitored Patient Re-evaluated:Patient Re-evaluated prior to induction Oxygen Delivery Method: Circle system utilized Preoxygenation: Pre-oxygenation with 100% oxygen Induction Type: IV induction Ventilation: Mask ventilation without difficulty Laryngoscope Size: Mac and 3 Grade View: Grade I Tube type: Oral Tube size: 6.5 mm Number of attempts: 1 Airway Equipment and Method: Stylet Placement Confirmation: ETT inserted through vocal cords under direct vision,  positive ETCO2,  CO2 detector and breath sounds checked- equal and bilateral Secured at: 22 cm Tube secured with: Tape Dental Injury: Teeth and Oropharynx as per pre-operative assessment

## 2017-12-04 NOTE — Interval H&P Note (Signed)
History and Physical Interval Note:  12/04/2017 7:18 AM  Theresa Howe  has presented today for surgery, with the diagnosis of small bowel resection.  The various methods of treatment have been discussed with the patient and family. After consideration of risks, benefits and other options for treatment, the patient has consented to  Procedure(s): [place] (N/A) as a surgical intervention .  The patient's history has been reviewed, patient examined, no change in status, stable for surgery.  I have reviewed the patient's chart and labs.  Questions were answered to the patient's satisfaction.     Stark Klein

## 2017-12-04 NOTE — Anesthesia Preprocedure Evaluation (Signed)
Anesthesia Evaluation  Patient identified by MRN, date of birth, ID band Patient awake    Reviewed: Allergy & Precautions, H&P , NPO status , Patient's Chart, lab work & pertinent test results  Airway Mallampati: II  TM Distance: >3 FB Neck ROM: Full    Dental  (+) Dental Advisory Given   Pulmonary neg pulmonary ROS,    Pulmonary exam normal breath sounds clear to auscultation       Cardiovascular hypertension, Pt. on medications and Pt. on home beta blockers Normal cardiovascular exam Rhythm:Regular Rate:Normal     Neuro/Psych Anxiety negative neurological ROS  negative psych ROS   GI/Hepatic negative GI ROS, Neg liver ROS,   Endo/Other  diabetes, Type 2, Oral Hypoglycemic Agents  Renal/GU negative Renal ROS  negative genitourinary   Musculoskeletal negative musculoskeletal ROS (+)   Abdominal   Peds  Hematology negative hematology ROS (+)   Anesthesia Other Findings Abdominal mass  Reproductive/Obstetrics                             Anesthesia Physical  Anesthesia Plan  ASA: III  Anesthesia Plan: General   Post-op Pain Management:    Induction: Intravenous  PONV Risk Score and Plan: 3 and Ondansetron, Dexamethasone, Midazolam and Treatment may vary due to age or medical condition  Airway Management Planned: Oral ETT  Additional Equipment:   Intra-op Plan:   Post-operative Plan: Extubation in OR  Informed Consent:   Dental advisory given  Plan Discussed with: CRNA  Anesthesia Plan Comments:         Anesthesia Quick Evaluation

## 2017-12-05 ENCOUNTER — Encounter (HOSPITAL_COMMUNITY): Payer: Self-pay | Admitting: General Surgery

## 2017-12-05 LAB — CBC
HEMATOCRIT: 30.2 % — AB (ref 36.0–46.0)
Hemoglobin: 10.2 g/dL — ABNORMAL LOW (ref 12.0–15.0)
MCH: 27.8 pg (ref 26.0–34.0)
MCHC: 33.8 g/dL (ref 30.0–36.0)
MCV: 82.3 fL (ref 78.0–100.0)
PLATELETS: 225 10*3/uL (ref 150–400)
RBC: 3.67 MIL/uL — ABNORMAL LOW (ref 3.87–5.11)
RDW: 14.8 % (ref 11.5–15.5)
WBC: 10.5 10*3/uL (ref 4.0–10.5)

## 2017-12-05 LAB — BASIC METABOLIC PANEL
Anion gap: 9 (ref 5–15)
BUN: 18 mg/dL (ref 6–20)
CALCIUM: 7.9 mg/dL — AB (ref 8.9–10.3)
CO2: 19 mmol/L — AB (ref 22–32)
CREATININE: 0.89 mg/dL (ref 0.44–1.00)
Chloride: 103 mmol/L (ref 101–111)
GFR calc Af Amer: >60 mL/min (ref >60)
GFR calc non Af Amer: 58 mL/min — ABNORMAL LOW (ref >60)
GLUCOSE: 239 mg/dL — AB (ref 65–99)
Potassium: 4.3 mmol/L (ref 3.5–5.1)
Sodium: 131 mmol/L — ABNORMAL LOW (ref 135–145)

## 2017-12-05 LAB — GLUCOSE, CAPILLARY
Glucose-Capillary: 256 mg/dL — ABNORMAL HIGH (ref 65–99)
Glucose-Capillary: 273 mg/dL — ABNORMAL HIGH (ref 65–99)
Glucose-Capillary: 185 mg/dL — ABNORMAL HIGH (ref 65–99)
Glucose-Capillary: 210 mg/dL — ABNORMAL HIGH (ref 65–99)

## 2017-12-05 MED ORDER — BOOST / RESOURCE BREEZE PO LIQD CUSTOM
1.0000 | Freq: Three times a day (TID) | ORAL | Status: DC
  Administered 2017-12-05 – 2017-12-10 (×11): 1 via ORAL

## 2017-12-05 NOTE — Progress Notes (Signed)
Inpatient Diabetes Program Recommendations  AACE/ADA: New Consensus Statement on Inpatient Glycemic Control (2015)  Target Ranges:  Prepandial:   less than 140 mg/dL      Peak postprandial:   less than 180 mg/dL (1-2 hours)      Critically ill patients:  140 - 180 mg/dL   Lab Results  Component Value Date   GLUCAP 273 (H) 12/05/2017   HGBA1C 7.1 (H) 11/28/2017    Review of Glycemic Control Results for Theresa Howe, Theresa Howe (MRN [id number]) as of 12/05/2017 13:38  Ref. Range 12/04/2017 17:03 12/04/2017 21:34 12/05/2017 04:01 12/05/2017 07:56 12/05/2017 11:48  Glucose-Capillary Latest Ref Range: 65 - 99 mg/dL 243 (H) 257 (H)  256 (H) 273 (H)   Diabetes history: Type 2 DM Outpatient Diabetes medications: Metformin 1,000mg  BID Current orders for Inpatient glycemic control: Novolog 0-9 TID with meals  Inpatient Diabetes Program Recommendations:    Would recommend starting Levemir 6 Units QD due to increased blood sugars.   Thanks, Bronson Curb, MSN, RNC-OB Diabetes Coordinator [phone] (8a-5p)

## 2017-12-05 NOTE — Progress Notes (Signed)
   12/05/17 4665  What Happened  Was fall witnessed? No  Was patient injured? No  Patient found on floor  Found by Staff-comment (pt yelling for help)  Stated prior activity other (comment) (looking where a noise was coming from)  Follow Up  MD notified Dr. Georgette Dover  Time MD notified [phone]  Family notified Yes-comment  Time family notified [phone]  Additional tests No  Adult Fall Risk Assessment  Risk Factor Category (scoring not indicated) Fall has occurred during this admission (document High fall risk)  Age 83  Fall History: Fall within 6 months prior to admission 0  Elimination; Bowel and/or Urine Incontinence 0  Elimination; Bowel and/or Urine Urgency/Frequency 0  Medications: includes PCA/Opiates, Anti-convulsants, Anti-hypertensives, Diuretics, Hypnotics, Laxatives, Sedatives, and Psychotropics 5  Patient Care Equipment 2  Mobility-Assistance 2  Mobility-Gait 2  Mobility-Sensory Deficit 0  Altered awareness of immediate physical environment 0  Impulsiveness 0  Lack of understanding of one's physical/cognitive limitations 0  Total Score 14  Patient's Fall Risk High Fall Risk (>13 points)  Adult Fall Risk Interventions  Required Bundle Interventions *See Row Information* High fall risk - low, moderate, and high requirements implemented  Additional Interventions Family Supervision  Screening for Fall Injury Risk  Risk For Fall Injury- See Row Information  None identified

## 2017-12-05 NOTE — Progress Notes (Signed)
Initial Nutrition Assessment  DOCUMENTATION CODES:   Severe malnutrition in context of chronic illness  INTERVENTION:   -Boost Breeze po TID, each supplement provides 250 kcal and 9 grams of protein -RD will follow for diet advancement and supplement as appropriate  NUTRITION DIAGNOSIS:   Severe Malnutrition related to chronic illness(abdominal mass) as evidenced by energy intake < 75% for > or equal to 1 month, moderate fat depletion, severe fat depletion, moderate muscle depletion, severe muscle depletion.  GOAL:   Patient will meet greater than or equal to 90% of their needs  MONITOR:   PO intake, Supplement acceptance, Diet advancement, Labs, Weight trends, Skin, I & O's  REASON FOR ASSESSMENT:   Malnutrition Screening Tool    ASSESSMENT:   The patient is a 83 year old female who presents with an abdominal mass.   S/p Procedure(s) 12/04/17: Diagnostic laparoscopy and small bowel resection, partial omentectomy  Spoke with pt and niece at bedside. Pt reports appetite and decreased weight loss over the past 5-6 months, which he attributes to social circumstances (death of a family member and her dog's traumatic illness). She shares UBW is around 126#. Reviewed wt hx; noted 6.7% wt loss over the past 10 months, which is not significant for time frame.  Per pt niece, pt has always been small framed, but clothing size has decreased from a size 10 to a size 6-8.   Pt niece reports minimal intake over the past several months. Pt has been consuming about 2 meals per day- a lot of chicken and soup. Pt niece estimates that pt has been consuming about 50% of her usual intake. Despite weight loss and poor intake, pt has remained very independent (pt still lives alone and drives).   Pt consumed very little of clear liquids today (just a few bites of jello). Per niece, anticipating advancing diety to full liquids later on today. Pt was consuming Boost supplements at home. Discussed  importance of good meal and supplement intake to promote healing. Also discussed potential for further diet progression.   Labs reviewed: CBGS: 243-257 (inpatinet orders for glycemic control are 0-9 units insulin aspart TID with meals), Na: !31 (on IV supplementation)  NUTRITION - FOCUSED PHYSICAL EXAM:    Most Recent Value  Orbital Region  Moderate depletion  Upper Arm Region  Severe depletion  Thoracic and Lumbar Region  Moderate depletion  Buccal Region  Severe depletion  Temple Region  Severe depletion  Clavicle Bone Region  Severe depletion  Clavicle and Acromion Bone Region  Moderate depletion  Scapular Bone Region  Severe depletion  Dorsal Hand  Severe depletion  Patellar Region  Moderate depletion  Anterior Thigh Region  Moderate depletion  Posterior Calf Region  Moderate depletion  Edema (RD Assessment)  Mild  Hair  Reviewed  Eyes  Reviewed  Mouth  Reviewed  Skin  Reviewed  Nails  Reviewed       Diet Order:  Diet bariatric clear liquid Room service appropriate? Yes; Fluid consistency: Thin  EDUCATION NEEDS:   Education needs have been addressed  Skin:  Skin Assessment: Skin Integrity Issues: Skin Integrity Issues:: Incisions Incisions: closed abdomen  Last BM:  12/04/17  Height:   Ht Readings from Last 1 Encounters:  12/04/17 5\' 5"  (1.651 m)    Weight:   Wt Readings from Last 1 Encounters:  12/04/17 119 lb 11.4 oz (54.3 kg)    Ideal Body Weight:  56.8 kg  BMI:  Body mass index is 19.92 kg/m.  Estimated Nutritional Needs:   Kcal:  1400-1600  Protein:  65-80 grams  Fluid:  > 1.4 L    Theresa Howe, RD, LDN, CDE Pager: [phone] After hours Pager: [phone]

## 2017-12-06 DIAGNOSIS — E43 Unspecified severe protein-calorie malnutrition: Secondary | ICD-10-CM

## 2017-12-06 LAB — BASIC METABOLIC PANEL
ANION GAP: 6 (ref 5–15)
BUN: 16 mg/dL (ref 6–20)
CALCIUM: 7.9 mg/dL — AB (ref 8.9–10.3)
CO2: 18 mmol/L — AB (ref 22–32)
Chloride: 106 mmol/L (ref 101–111)
Creatinine, Ser: 0.86 mg/dL (ref 0.44–1.00)
Glucose, Bld: 255 mg/dL — ABNORMAL HIGH (ref 65–99)
Potassium: 4.5 mmol/L (ref 3.5–5.1)
SODIUM: 130 mmol/L — AB (ref 135–145)

## 2017-12-06 LAB — CBC
HEMATOCRIT: 29.6 % — AB (ref 36.0–46.0)
Hemoglobin: 10.1 g/dL — ABNORMAL LOW (ref 12.0–15.0)
MCH: 27.5 pg (ref 26.0–34.0)
MCHC: 34.1 g/dL (ref 30.0–36.0)
MCV: 80.7 fL (ref 78.0–100.0)
Platelets: 217 10*3/uL (ref 150–400)
RBC: 3.67 MIL/uL — ABNORMAL LOW (ref 3.87–5.11)
RDW: 14.9 % (ref 11.5–15.5)
WBC: 15.2 10*3/uL — AB (ref 4.0–10.5)

## 2017-12-06 LAB — GLUCOSE, CAPILLARY
GLUCOSE-CAPILLARY: 249 mg/dL — AB (ref 65–99)
GLUCOSE-CAPILLARY: 209 mg/dL — AB (ref 65–99)
GLUCOSE-CAPILLARY: 206 mg/dL — AB (ref 65–99)
Glucose-Capillary: 141 mg/dL — ABNORMAL HIGH (ref 65–99)

## 2017-12-06 MED ORDER — INSULIN GLARGINE 100 UNIT/ML ~~LOC~~ SOLN
6.0000 [IU] | Freq: Every day | SUBCUTANEOUS | Status: DC
  Administered 2017-12-06 – 2017-12-13 (×8): 6 [IU] via SUBCUTANEOUS
  Filled 2017-12-06 (×8): qty 0.06

## 2017-12-06 MED ORDER — PROCHLORPERAZINE EDISYLATE 5 MG/ML IJ SOLN: 5.0000 mg | Freq: Four times a day (QID) | INTRAMUSCULAR | Status: DC | PRN

## 2017-12-06 NOTE — Progress Notes (Signed)
Delayed entry from 12/19  POD 1   Subjective/Chief Complaint: No n/v.  No flatus.     Objective: Vital signs in last 24 hours: Temp:  [98 F (36.7 C)-99.5 F (37.5 C)] 98 F (36.7 C) (12/20 0500) Pulse Rate:  [116-131] 119 (12/20 0500) Resp:  [18] 18 (12/20 0500) BP: (94-111)/(60-66) 111/60 (12/20 0500) SpO2:  [99 %-100 %] 99 % (12/20 0500) Last BM Date: 12/05/17  Intake/Output from previous day: 12/19 0701 - 12/20 0700 In: 0  Out: 250 [Emesis/NG output:250] Intake/Output this shift: No intake/output data recorded.  General appearance: alert, cooperative and no distress Resp: breathing comfortably GI: soft, non distended, dressings c/d/i.  Lab Results:  Recent Labs    12/05/17 0401 12/06/17 0618  WBC 10.5 15.2*  HGB 10.2* 10.1*  HCT 30.2* 29.6*  PLT 225 217   BMET Recent Labs    12/05/17 0401 12/06/17 0618  NA 131* 130*  K 4.3 4.5  CL 103 106  CO2 19* 18*  GLUCOSE 239* 255*  BUN 18 16  CREATININE 0.89 0.86  CALCIUM 7.9* 7.9*   PT/INR No results for input(s): LABPROT, INR in the last 72 hours. ABG No results for input(s): PHART, HCO3 in the last 72 hours.  Invalid input(s): PCO2, PO2  Studies/Results: No results found.  Anti-infectives: Anti-infectives (From admission, onward)   Start     Dose/Rate Route Frequency Ordered Stop   12/04/17 1600  ceFAZolin (ANCEF) IVPB 2g/100 mL premix     2 g 200 mL/hr over 30 Minutes Intravenous Every 8 hours 12/04/17 1344 12/04/17 1746   12/04/17 0643  ceFAZolin (ANCEF) IVPB 2g/100 mL premix     2 g 200 mL/hr over 30 Minutes Intravenous On call to O.R. 12/04/17 1610 12/04/17 0825      Assessment/Plan: s/p Procedure(s): LAPAROSCOPIC SMALL BOWEL RESECTION (N/A) LAPAROSCOPY DIAGNOSTIC (N/A)   d/c foley  D/c PCA as not using. Clears Diabetes, - RISS   LOS: 2 days    Stark Klein 12/06/2017

## 2017-12-06 NOTE — Progress Notes (Signed)
Delayed entry from 12/19  POD 2  Subjective/Chief Complaint: Several episodes of emesis.     Objective: Vital signs in last 24 hours: Temp:  [98 F (36.7 C)-99.5 F (37.5 C)] 98 F (36.7 C) (12/20 0500) Pulse Rate:  [116-131] 119 (12/20 0500) Resp:  [18] 18 (12/20 0500) BP: (94-111)/(60-66) 111/60 (12/20 0500) SpO2:  [99 %-100 %] 99 % (12/20 0500) Last BM Date: 12/05/17  Intake/Output from previous day: 12/19 0701 - 12/20 0700 In: 0  Out: 250 [Emesis/NG output:250] Intake/Output this shift: No intake/output data recorded.  General appearance: alert, cooperative and no distress Resp: breathing comfortably GI: soft, mildly distended, dressings c/d/i.  Lab Results:  Recent Labs    12/05/17 0401 12/06/17 0618  WBC 10.5 15.2*  HGB 10.2* 10.1*  HCT 30.2* 29.6*  PLT 225 217   BMET Recent Labs    12/05/17 0401 12/06/17 0618  NA 131* 130*  K 4.3 4.5  CL 103 106  CO2 19* 18*  GLUCOSE 239* 255*  BUN 18 16  CREATININE 0.89 0.86  CALCIUM 7.9* 7.9*   PT/INR No results for input(s): LABPROT, INR in the last 72 hours. ABG No results for input(s): PHART, HCO3 in the last 72 hours.  Invalid input(s): PCO2, PO2  Studies/Results: No results found.  Anti-infectives: Anti-infectives (From admission, onward)   Start     Dose/Rate Route Frequency Ordered Stop   12/04/17 1600  ceFAZolin (ANCEF) IVPB 2g/100 mL premix     2 g 200 mL/hr over 30 Minutes Intravenous Every 8 hours 12/04/17 1344 12/04/17 1746   12/04/17 0643  ceFAZolin (ANCEF) IVPB 2g/100 mL premix     2 g 200 mL/hr over 30 Minutes Intravenous On call to O.R. 12/04/17 6226 12/04/17 0825      Assessment/Plan: s/p Procedure(s): LAPAROSCOPIC SMALL BOWEL RESECTION (N/A) LAPAROSCOPY DIAGNOSTIC (N/A)   d/c foley  Antiemetics. NGT if vomits again. Await return of bowel function Await pathology  Diabetes, - RISS   LOS: 2 days    Stark Klein 12/06/2017

## 2017-12-07 LAB — BASIC METABOLIC PANEL
Anion gap: 9 (ref 5–15)
BUN: 14 mg/dL (ref 6–20)
CO2: 15 mmol/L — ABNORMAL LOW (ref 22–32)
CREATININE: 0.74 mg/dL (ref 0.44–1.00)
Calcium: 8 mg/dL — ABNORMAL LOW (ref 8.9–10.3)
Chloride: 108 mmol/L (ref 101–111)
GFR calc Af Amer: >60 mL/min (ref >60)
Glucose, Bld: 207 mg/dL — ABNORMAL HIGH (ref 65–99)
Potassium: 4.2 mmol/L (ref 3.5–5.1)
SODIUM: 132 mmol/L — AB (ref 135–145)

## 2017-12-07 LAB — CBC
HCT: 29.7 % — ABNORMAL LOW (ref 36.0–46.0)
Hemoglobin: 10.1 g/dL — ABNORMAL LOW (ref 12.0–15.0)
MCH: 27.8 pg (ref 26.0–34.0)
MCHC: 34 g/dL (ref 30.0–36.0)
MCV: 81.8 fL (ref 78.0–100.0)
PLATELETS: 217 10*3/uL (ref 150–400)
RBC: 3.63 MIL/uL — ABNORMAL LOW (ref 3.87–5.11)
RDW: 15.4 % (ref 11.5–15.5)
WBC: 12.2 10*3/uL — ABNORMAL HIGH (ref 4.0–10.5)

## 2017-12-07 LAB — GLUCOSE, CAPILLARY
GLUCOSE-CAPILLARY: 237 mg/dL — AB (ref 65–99)
GLUCOSE-CAPILLARY: 187 mg/dL — AB (ref 65–99)
Glucose-Capillary: 276 mg/dL — ABNORMAL HIGH (ref 65–99)
Glucose-Capillary: 244 mg/dL — ABNORMAL HIGH (ref 65–99)

## 2017-12-07 MED ORDER — OXYCODONE HCL 5 MG/5ML PO SOLN
5.0000 mg | Freq: Four times a day (QID) | ORAL | Status: DC
  Administered 2017-12-07 – 2017-12-13 (×20): 5 mg via ORAL
  Filled 2017-12-07 (×24): qty 5

## 2017-12-07 NOTE — Progress Notes (Signed)
Inpatient Diabetes Program Recommendations  AACE/ADA: New Consensus Statement on Inpatient Glycemic Control (2015)  Target Ranges:  Prepandial:   less than 140 mg/dL      Peak postprandial:   less than 180 mg/dL (1-2 hours)      Critically ill patients:  140 - 180 mg/dL   Results for KHILYNN, BORNTREGER (MRN [id number]) as of 12/07/2017 13:45  Ref. Range 12/06/2017 07:48 12/06/2017 12:01 12/06/2017 17:10 12/06/2017 21:07  Glucose-Capillary Latest Ref Range: 65 - 99 mg/dL 249 (H) 209 (H) 206 (H) 141 (H)   Results for JANALYN, HIGBY (MRN [id number]) as of 12/07/2017 13:45  Ref. Range 12/07/2017 08:27 12/07/2017 12:07  Glucose-Capillary Latest Ref Range: 65 - 99 mg/dL 237 (H) 276 (H)   Results for YONG, WAHLQUIST (MRN [id number]) as of 12/07/2017 13:45  Ref. Range 11/28/2017 12:20  Hemoglobin A1C Latest Ref Range: 4.8 - 5.6 % 7.1 (H)    Home DM Meds: Metformin 500 mg BID  Current Insulin Orders: Lantus 6 units daily      Novolog Sensitive Correction Scale/ SSI (0-9 units) TID AC      MD- Patient has good glucose control at home with Metformin alone as evidenced by current A1c of 7.1%.   Having some glucose elevations here in hospital.  Please consider the following in-hospital adjustments while home Metformin on hold:  1. Increase Lantus to 10 units daily (0.2 units/kg dosing)  2. Increase frequency of Novolog SSI to Q4 hours while PO intake low     --Will follow patient during hospitalization--  Wyn Quaker RN, MSN, CDE Diabetes Coordinator Inpatient Glycemic Control Team Team Pager: [phone] (8a-5p)

## 2017-12-07 NOTE — Care Management Important Message (Signed)
Important Message  Patient Details  Name: Theresa Howe MRN: [id number] Date of Birth: [birth year]   Medicare Important Message Given:  Yes    Tijah Hane [address] 12/07/2017, 1:56 PM

## 2017-12-07 NOTE — Progress Notes (Signed)
Delayed entry from 12/19  POD 3  Subjective/Chief Complaint: + flatus and 4 BMs.  Is having some pain, but not asking for pain meds.  HR was a little elevated.  No more emesis since yesterday AM.   Objective: Vital signs in last 24 hours: Temp:  [98.2 F (36.8 C)-98.6 F (37 C)] 98.2 F (36.8 C) (12/21 0521) Pulse Rate:  [118-123] 118 (12/21 0521) Resp:  [17-18] 17 (12/21 0521) BP: (110-113)/(60-66) 112/66 (12/21 0521) SpO2:  [97 %-98 %] 97 % (12/21 0521) Last BM Date: 12/06/17  Intake/Output from previous day: 12/20 0701 - 12/21 0700 In: 3630 [P.O.:30; I.V.:3600] Out: 150 [Urine:150] Intake/Output this shift: No intake/output data recorded.  General appearance: alert, cooperative and no distress Resp: breathing comfortably GI: soft, non distended, dressings c/d/i. approp tender.  Lab Results:  Recent Labs    12/06/17 0618 12/07/17 0522  WBC 15.2* 12.2*  HGB 10.1* 10.1*  HCT 29.6* 29.7*  PLT 217 217   BMET Recent Labs    12/06/17 0618 12/07/17 0522  NA 130* 132*  K 4.5 4.2  CL 106 108  CO2 18* 15*  GLUCOSE 255* 207*  BUN 16 14  CREATININE 0.86 0.74  CALCIUM 7.9* 8.0*   PT/INR No results for input(s): LABPROT, INR in the last 72 hours. ABG No results for input(s): PHART, HCO3 in the last 72 hours.  Invalid input(s): PCO2, PO2  Studies/Results: No results found.  Anti-infectives: Anti-infectives (From admission, onward)   Start     Dose/Rate Route Frequency Ordered Stop   12/04/17 1600  ceFAZolin (ANCEF) IVPB 2g/100 mL premix     2 g 200 mL/hr over 30 Minutes Intravenous Every 8 hours 12/04/17 1344 12/04/17 1746   12/04/17 0643  ceFAZolin (ANCEF) IVPB 2g/100 mL premix     2 g 200 mL/hr over 30 Minutes Intravenous On call to O.R. 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  Residual low  volume carcinomatosis.  Oncology stopped by this AM, setting up outpatient follow up.   Back to clears, then fulls in AM.   Given the fact that patient is hurting but not asking for pain meds, will do low dose oxycodone q 6 hours.   PT consult.   Diabetes, - RISS   LOS: 3 days    Stark Klein 12/07/2017

## 2017-12-08 LAB — CBC
HCT: 24.6 % — ABNORMAL LOW (ref 36.0–46.0)
Hemoglobin: 8.3 g/dL — ABNORMAL LOW (ref 12.0–15.0)
MCH: 27.1 pg (ref 26.0–34.0)
MCHC: 33.7 g/dL (ref 30.0–36.0)
MCV: 80.4 fL (ref 78.0–100.0)
PLATELETS: 213 10*3/uL (ref 150–400)
RBC: 3.06 MIL/uL — AB (ref 3.87–5.11)
RDW: 14.9 % (ref 11.5–15.5)
WBC: 8.9 10*3/uL (ref 4.0–10.5)

## 2017-12-08 LAB — GLUCOSE, CAPILLARY
GLUCOSE-CAPILLARY: 254 mg/dL — AB (ref 65–99)
Glucose-Capillary: 183 mg/dL — ABNORMAL HIGH (ref 65–99)
Glucose-Capillary: 292 mg/dL — ABNORMAL HIGH (ref 65–99)
Glucose-Capillary: 190 mg/dL — ABNORMAL HIGH (ref 65–99)

## 2017-12-08 LAB — BASIC METABOLIC PANEL
Anion gap: 6 (ref 5–15)
BUN: 11 mg/dL (ref 6–20)
CALCIUM: 7.8 mg/dL — AB (ref 8.9–10.3)
CO2: 18 mmol/L — ABNORMAL LOW (ref 22–32)
CREATININE: 0.79 mg/dL (ref 0.44–1.00)
Chloride: 108 mmol/L (ref 101–111)
GFR calc Af Amer: >60 mL/min (ref >60)
Glucose, Bld: 260 mg/dL — ABNORMAL HIGH (ref 65–99)
POTASSIUM: 4.3 mmol/L (ref 3.5–5.1)
SODIUM: 132 mmol/L — AB (ref 135–145)

## 2017-12-08 NOTE — Evaluation (Signed)
Physical Therapy Evaluation Patient Details Name: Theresa Howe MRN: [id number] DOB: [birth year] Today's Date: 12/08/2017   History of Present Illness  Pt is an 83 y/o female who presents with an abdominal mass and 25-30 pound weight loss. CT revealed a mesenteric mass and small bowel mass, dilated pancreatic duct, and mass in the liver. The patient is now s/p small bowel resection on 12/04/17.  Clinical Impression  Pt admitted with above diagnosis. Pt currently with functional limitations due to the deficits listed below (see PT Problem List). At the time of PT eval pt was able to perform transfers and ambulation with gross min to mod assist and RW for support. Pt very unsteady on her feet and was moderately confused throughout session. Niece present for most of session and provided home living and PLOF. Discussed options for d/c at length, and feel continued rehab at the SNF level is most appropriate at this time. Pt and niece agreeable to SNF with the goal of improving independence to return home with niece and family to assist. Pt will benefit from skilled PT to increase their independence and safety with mobility to allow discharge to the venue listed below.       Follow Up Recommendations SNF;Supervision/Assistance - 24 hour    Equipment Recommendations  Rolling walker with 5" wheels;3in1 (PT)    Recommendations for Other Services       Precautions / Restrictions Precautions Precautions: Fall Precaution Comments: Abdominial incision Restrictions Weight Bearing Restrictions: No      Mobility  Bed Mobility Overal bed mobility: Needs Assistance Bed Mobility: Supine to Sit     Supine to sit: Min assist     General bed mobility comments: Pt initiating transition to EOB well, however required assist to complete. Posterior lean noted and heavy use of rails for support when scooting.   Transfers Overall transfer level: Needs assistance Equipment used: Rolling walker (2  wheeled);1 person hand held assist Transfers: Sit to/from Stand Sit to Stand: Min assist;Mod assist         General transfer comment: Initially with HHA only. Pt required min assist to power-up to full stand and mod assist to gain/maintain standing balance. Stood to [employer] with min assist throughout.   Ambulation/Gait Ambulation/Gait assistance: Min assist;Mod assist Ambulation Distance (Feet): 40 Feet Assistive device: Rolling walker (2 wheeled);1 person hand held assist Gait Pattern/deviations: Step-through pattern;Decreased stride length;Trunk flexed Gait velocity: Decreased Gait velocity interpretation: Below normal speed for age/gender General Gait Details: Initially with HHA for support - pt required mod assist for balance support. Posterior lean noted and pt with several staggering steps laterally. With RW, pt was able to ambulate with heavy min assist for balance, increasing to mod assist when turning.   Stairs            Wheelchair Mobility    Modified Rankin (Stroke Patients Only)       Balance Overall balance assessment: Needs assistance Sitting-balance support: Feet supported;No upper extremity supported Sitting balance-Leahy Scale: Poor Sitting balance - Comments: poor initially improving to fair by end of session Postural control: Posterior lean Standing balance support: Bilateral upper extremity supported;During functional activity Standing balance-Leahy Scale: Poor Standing balance comment: reliant on UE support during functional mobility.                             Pertinent Vitals/Pain Pain Assessment: Faces Faces Pain Scale: Hurts a little bit Pain Location: Abdomen Pain Descriptors /  Indicators: Operative site guarding;Grimacing Pain Intervention(s): Limited activity within patient's tolerance;Monitored during session;Repositioned    Home Living Family/patient expects to be discharged to:: Private residence Living Arrangements: Other  relatives Available Help at Discharge: Family;Available 24 hours/day Type of Home: House Home Access: Stairs to enter   [employer] of Steps: 2 Home Layout: Two level;Bed/bath upstairs Home Equipment: None Additional Comments: Above information is for niece's house    Prior Function Level of Independence: Independent         Comments: Per family, pt very independent PTA. Currently living alone with her dog, Charlie.      Hand Dominance        Extremity/Trunk Assessment   Upper Extremity Assessment Upper Extremity Assessment: Defer to OT evaluation    Lower Extremity Assessment Lower Extremity Assessment: Generalized weakness    Cervical / Trunk Assessment Cervical / Trunk Assessment: Kyphotic  Communication   Communication: No difficulties  Cognition Arousal/Alertness: Awake/alert Behavior During Therapy: WFL for tasks assessed/performed Overall Cognitive Status: Impaired/Different from baseline Area of Impairment: Following commands;Safety/judgement;Awareness;Problem solving                       Following Commands: Follows one step commands consistently;Follows one step commands with increased time;Follows multi-step commands inconsistently Safety/Judgement: Decreased awareness of safety;Decreased awareness of deficits Awareness: Intellectual Problem Solving: Slow processing;Decreased initiation;Difficulty sequencing;Requires verbal cues General Comments: Pt with mittens donned when PT entered. Room was empty and pt asked several times "if all the kids had left".       General Comments      Exercises     Assessment/Plan    PT Assessment Patient needs continued PT services  PT Problem List Decreased strength;Decreased range of motion;Decreased activity tolerance;Decreased balance;Decreased mobility;Decreased knowledge of use of DME;Decreased cognition;Decreased safety awareness;Decreased knowledge of precautions;Pain       PT  Treatment Interventions DME instruction;Gait training;Stair training;Functional mobility training;Therapeutic activities;Therapeutic exercise;Neuromuscular re-education;Patient/family education    PT Goals (Current goals can be found in the Care Plan section)  Acute Rehab PT Goals Patient Stated Goal: Get better, go to niece's house PT Goal Formulation: With patient/family Time For Goal Achievement: 12/22/17 Potential to Achieve Goals: Good    Frequency Min 2X/week   Barriers to discharge        Co-evaluation               AM-PAC PT "6 Clicks" Daily Activity  Outcome Measure Difficulty turning over in bed (including adjusting bedclothes, sheets and blankets)?: A Little Difficulty moving from lying on back to sitting on the side of the bed? : A Lot Difficulty sitting down on and standing up from a chair with arms (e.g., wheelchair, bedside commode, etc,.)?: Unable Help needed moving to and from a bed to chair (including a wheelchair)?: A Little Help needed walking in hospital room?: A Little Help needed climbing 3-5 steps with a railing? : Total 6 Click Score: 13    End of Session Equipment Utilized During Treatment: Gait belt Activity Tolerance: Patient tolerated treatment well Patient left: in bed;with call bell/phone within reach;with family/visitor present Nurse Communication: Mobility status PT Visit Diagnosis: Unsteadiness on feet (R26.81);Difficulty in walking, not elsewhere classified (R26.2);Pain Pain - part of body: (abdomen)    Time: 1610-9604 PT Time Calculation (min) (ACUTE ONLY): 38 min   Charges:   PT Evaluation $PT Eval Moderate Complexity: 1 Mod PT Treatments $Gait Training: 23-37 mins   PT G Codes:  Conni Slipper, PT, DPT Acute Rehabilitation Services Pager: [phone]   Marylynn Pearson 12/08/2017, 1:41 PM

## 2017-12-08 NOTE — Progress Notes (Signed)
[facility]/Trauma Progress Note  4 Days Post-Op   Assessment/Plan  Active Problems:   Small bowel mass   Protein-calorie malnutrition, severe DM - SSI HTN Glaucoma  - home drops  Neuroendocrine tumpr and carcinomatosis - S/P laparoscopic small bowel resection. Dr. Barry Dienes, 12/18 - no BM since yesterday and pt states no flatus, no BS appreciated, will leave on clears  FEN: clears VTE: SCD's, lovenox ID: Ancef pre-op only Follow up: Dr. Barry Dienes   LOS: 4 days   Subjective:  CC: Small Bowel resection  Pt denies nausea or vomiting. She states no flatus or BM since yesterday. Pain controlled.   Objective: Vital signs in last 24 hours: Temp:  [97.8 F (36.6 C)-98.3 F (36.8 C)] 98.3 F (36.8 C) (12/22 0601) Pulse Rate:  [63-102] 100 (12/22 0601) Resp:  [17-20] 17 (12/22 0601) BP: (112-117)/(58-64) 112/58 (12/22 0601) SpO2:  [96 %-97 %] 96 % (12/22 0601) Last BM Date: 12/07/17  Intake/Output from previous day: 12/21 0701 - 12/22 0700 In: 1017 [P.O.:240; I.V.:905] Out: 0  Intake/Output this shift: No intake/output data recorded.  PE: Gen:  Alert, NAD, pleasant Card:  RRR, no M/G/R heard Pulm:  CTA, no W/R/R, effort normal Abd: Soft, not distended, no BS appreciated, incisions C/D/I, TTP of right sided abdomen without guarding Skin: no rashes noted, warm and dry Extremities: no edema   Anti-infectives: Anti-infectives (From admission, onward)   Start     Dose/Rate Route Frequency Ordered Stop   12/04/17 1600  ceFAZolin (ANCEF) IVPB 2g/100 mL premix     2 g 200 mL/hr over 30 Minutes Intravenous Every 8 hours 12/04/17 1344 12/04/17 1746   12/04/17 0643  ceFAZolin (ANCEF) IVPB 2g/100 mL premix     2 g 200 mL/hr over 30 Minutes Intravenous On call to O.R. 12/04/17 0643 12/04/17 0825      Lab Results:  Recent Labs    12/07/17 0522 12/08/17 0653  WBC 12.2* 8.9  HGB 10.1* 8.3*  HCT 29.7* 24.6*  PLT 217 213   BMET Recent Labs     12/07/17 0522 12/08/17 0653  NA 132* 132*  K 4.2 4.3  CL 108 108  CO2 15* 18*  GLUCOSE 207* 260*  BUN 14 11  CREATININE 0.74 0.79  CALCIUM 8.0* 7.8*   PT/INR No results for input(s): LABPROT, INR in the last 72 hours. CMP     Component Value Date/Time   NA 132 (L) 12/08/2017 0653   K 4.3 12/08/2017 0653   CL 108 12/08/2017 0653   CO2 18 (L) 12/08/2017 0653   GLUCOSE 260 (H) 12/08/2017 0653   BUN 11 12/08/2017 0653   CREATININE 0.79 12/08/2017 0653   CREATININE 0.97 (H) 10/05/2017 1400   CALCIUM 7.8 (L) 12/08/2017 0653   PROT 6.7 11/28/2017 1220   ALBUMIN 3.4 (L) 11/28/2017 1220   AST 30 11/28/2017 1220   ALT 18 11/28/2017 1220   ALKPHOS 61 11/28/2017 1220   BILITOT 0.8 11/28/2017 1220   GFRNONAA >60 12/08/2017 0653   GFRNONAA 54 (L) 10/05/2017 1400   GFRAA >60 12/08/2017 0653   GFRAA 62 10/05/2017 1400   Lipase     Component Value Date/Time   LIPASE 54 10/05/2017 1400    Studies/Results: No results found.    [name] , [facility] 12/08/2017, 8:36 AM Pager: [phone] Consults: [phone] Mon-Fri 7:00 am-4:30 pm Sat-Sun 7:00 am-11:30 am

## 2017-12-09 LAB — GLUCOSE, CAPILLARY
GLUCOSE-CAPILLARY: 212 mg/dL — AB (ref 65–99)
GLUCOSE-CAPILLARY: 173 mg/dL — AB (ref 65–99)
Glucose-Capillary: 153 mg/dL — ABNORMAL HIGH (ref 65–99)
Glucose-Capillary: 100 mg/dL — ABNORMAL HIGH (ref 65–99)

## 2017-12-09 LAB — BASIC METABOLIC PANEL
Anion gap: 6 (ref 5–15)
BUN: 8 mg/dL (ref 6–20)
CALCIUM: 7.8 mg/dL — AB (ref 8.9–10.3)
CHLORIDE: 108 mmol/L (ref 101–111)
CO2: 17 mmol/L — ABNORMAL LOW (ref 22–32)
CREATININE: 0.75 mg/dL (ref 0.44–1.00)
GFR calc Af Amer: >60 mL/min (ref >60)
GFR calc non Af Amer: >60 mL/min (ref >60)
Glucose, Bld: 218 mg/dL — ABNORMAL HIGH (ref 65–99)
Potassium: 4.7 mmol/L (ref 3.5–5.1)
SODIUM: 131 mmol/L — AB (ref 135–145)

## 2017-12-09 LAB — CBC
HCT: 23.7 % — ABNORMAL LOW (ref 36.0–46.0)
HEMOGLOBIN: 8.1 g/dL — AB (ref 12.0–15.0)
MCH: 27.4 pg (ref 26.0–34.0)
MCHC: 34.2 g/dL (ref 30.0–36.0)
MCV: 80.1 fL (ref 78.0–100.0)
Platelets: 228 10*3/uL (ref 150–400)
RBC: 2.96 MIL/uL — ABNORMAL LOW (ref 3.87–5.11)
RDW: 15.5 % (ref 11.5–15.5)
WBC: 8.9 10*3/uL (ref 4.0–10.5)

## 2017-12-09 NOTE — Progress Notes (Signed)
[facility]/Trauma Progress Note  5 Days Post-Op   Assessment/Plan  Active Problems:   Small bowel mass   Protein-calorie malnutrition, severe DM - SSI HTN Glaucoma  - home drops  Neuroendocrine tumor and carcinomatosis - S/P laparoscopic small bowel resection. Dr. Barry Dienes, 12/18 - nurse states pt is not drinking much, episode of emesis and BM yesterday  Tachycardia up to 115 - cardiac monitoring - Hg 8.1 stable - IVF  Hyponatremia - 131 12/23 - IVF  FEN: clears, slow to advance VTE: SCD's, lovenox ID: Ancef pre-op only Follow up: Dr. Barry Dienes  Plan: AM labs, advance diet slowly, PT recommending SNF, SNF when pt tolerates diet   LOS: 5 days    Subjective:  CC: Small bowel resection  Pt is unsure if she vomited yesterday or if she had a BM. She states no abdominal pain. No family at bedside. Pt denies CP or SOB. No difficulty breathing.   Objective: Vital signs in last 24 hours: Temp:  [98.2 F (36.8 C)-99.1 F (37.3 C)] 98.2 F (36.8 C) (12/23 0449) Pulse Rate:  [109-115] 109 (12/23 0449) Resp:  [17-18] 17 (12/23 0449) BP: (100-111)/(54-63) 100/63 (12/23 0449) SpO2:  [98 %-99 %] 98 % (12/23 0449) Last BM Date: 12/08/17  Intake/Output from previous day: 12/22 0701 - 12/23 0700 In: 2255 [P.O.:240; I.V.:1575] Out: 300 [Urine:300] Intake/Output this shift: No intake/output data recorded.  PE: Gen:  Alert, NAD, pleasant Card:  tachycardic, regular rhythm, normal S1 and S2 Pulm:  CTA, no W/R/R, rate and effort normal Abd: Soft, not distended, hypoactive BS, incisions C/D/I, TTP of right sided abdomen without guarding Skin: no rashes noted, warm and dry Extremities: no edema   Anti-infectives: Anti-infectives (From admission, onward)   Start     Dose/Rate Route Frequency Ordered Stop   12/04/17 1600  ceFAZolin (ANCEF) IVPB 2g/100 mL premix     2 g 200 mL/hr over 30 Minutes Intravenous Every 8 hours 12/04/17 1344 12/04/17 1746   12/04/17 0643  ceFAZolin (ANCEF) IVPB 2g/100 mL premix     2 g 200 mL/hr over 30 Minutes Intravenous On call to O.R. 12/04/17 0643 12/04/17 0825      Lab Results:  Recent Labs    12/08/17 0653 12/09/17 0450  WBC 8.9 8.9  HGB 8.3* 8.1*  HCT 24.6* 23.7*  PLT 213 228   BMET Recent Labs    12/08/17 0653 12/09/17 0450  NA 132* 131*  K 4.3 4.7  CL 108 108  CO2 18* 17*  GLUCOSE 260* 218*  BUN 11 8  CREATININE 0.79 0.75  CALCIUM 7.8* 7.8*   PT/INR No results for input(s): LABPROT, INR in the last 72 hours. CMP     Component Value Date/Time   NA 131 (L) 12/09/2017 0450   K 4.7 12/09/2017 0450   CL 108 12/09/2017 0450   CO2 17 (L) 12/09/2017 0450   GLUCOSE 218 (H) 12/09/2017 0450   BUN 8 12/09/2017 0450   CREATININE 0.75 12/09/2017 0450   CREATININE 0.97 (H) 10/05/2017 1400   CALCIUM 7.8 (L) 12/09/2017 0450   PROT 6.7 11/28/2017 1220   ALBUMIN 3.4 (L) 11/28/2017 1220   AST 30 11/28/2017 1220   ALT 18 11/28/2017 1220   ALKPHOS 61 11/28/2017 1220   BILITOT 0.8 11/28/2017 1220   GFRNONAA >60 12/09/2017 0450   GFRNONAA 54 (L) 10/05/2017 1400   GFRAA >60 12/09/2017 0450   GFRAA 62 10/05/2017 1400   Lipase     Component Value Date/Time  LIPASE 54 10/05/2017 1400    Studies/Results: No results found.    [name] , [facility] 12/09/2017, 10:15 AM Pager: [phone] Consults: [phone] Mon-Fri 7:00 am-4:30 pm Sat-Sun 7:00 am-11:30 am

## 2017-12-09 NOTE — Progress Notes (Signed)
When Patient placed on monitor, she was not responding to staff, she was looking down to the right, tongue was protruding out, and right leg was jerking. Pt was not responding to RN's questions, Sternal rub was performed, Pt slow to respond to questions, she did state her name, but very weak to the response - mumbled, and then drifted her sight back to there right again.  She continued with the jerking of her leg.  Activity continued for approximately for 5+ minutes.   MD was notified - Dr. Grandville Silos.  Pt was re-evaluated with Charge nurse, and Pt jumped in bed when I called her name and opened her eyes immediately and was able to answers questions at her baseline.   Spoke to Dr. Grandville Silos again, advised him that Pt was back to her baseline after the episode.

## 2017-12-10 LAB — CBC
HEMATOCRIT: 26 % — AB (ref 36.0–46.0)
Hemoglobin: 8.8 g/dL — ABNORMAL LOW (ref 12.0–15.0)
MCH: 27 pg (ref 26.0–34.0)
MCHC: 33.8 g/dL (ref 30.0–36.0)
MCV: 79.8 fL (ref 78.0–100.0)
PLATELETS: 296 10*3/uL (ref 150–400)
RBC: 3.26 MIL/uL — ABNORMAL LOW (ref 3.87–5.11)
RDW: 15.3 % (ref 11.5–15.5)
WBC: 11.7 10*3/uL — AB (ref 4.0–10.5)

## 2017-12-10 LAB — BASIC METABOLIC PANEL
Anion gap: 6 (ref 5–15)
BUN: 7 mg/dL (ref 6–20)
CALCIUM: 7.9 mg/dL — AB (ref 8.9–10.3)
CO2: 18 mmol/L — ABNORMAL LOW (ref 22–32)
CREATININE: 0.68 mg/dL (ref 0.44–1.00)
Chloride: 106 mmol/L (ref 101–111)
GFR calc Af Amer: >60 mL/min (ref >60)
Glucose, Bld: 242 mg/dL — ABNORMAL HIGH (ref 65–99)
POTASSIUM: 4.7 mmol/L (ref 3.5–5.1)
SODIUM: 130 mmol/L — AB (ref 135–145)

## 2017-12-10 LAB — GLUCOSE, CAPILLARY
GLUCOSE-CAPILLARY: 236 mg/dL — AB (ref 65–99)
GLUCOSE-CAPILLARY: 227 mg/dL — AB (ref 65–99)
Glucose-Capillary: 170 mg/dL — ABNORMAL HIGH (ref 65–99)
Glucose-Capillary: 123 mg/dL — ABNORMAL HIGH (ref 65–99)

## 2017-12-10 MED ORDER — ENSURE ENLIVE PO LIQD
237.0000 mL | Freq: Three times a day (TID) | ORAL | Status: DC
  Administered 2017-12-10 – 2017-12-13 (×8): 237 mL via ORAL

## 2017-12-10 NOTE — Progress Notes (Signed)
Patient ID: Theresa Howe, female   DOB: [birth year], 83 y.o.   MRN: [id number] [facility] Surgery Progress Note:   6 Days Post-Op  Subjective: Mental status is alert;  No complaints Objective: Vital signs in last 24 hours: Temp:  [98.6 F (37 C)-99.5 F (37.5 C)] 98.7 F (37.1 C) (12/24 0505) Pulse Rate:  [111-120] 111 (12/24 0505) Resp:  [12-16] 16 (12/24 0505) BP: (96-119)/(60-70) 114/70 (12/24 0505) SpO2:  [94 %-99 %] 99 % (12/24 0505) Weight:  [57.6 kg (127 lb)] 57.6 kg (127 lb) (12/24 0505)  Intake/Output from previous day: 12/23 0701 - 12/24 0700 In: 0  Out: 400 [Urine:400] Intake/Output this shift: No intake/output data recorded.  Physical Exam: Work of breathing is normal.  Taking clears OK  Lab Results:  Results for orders placed or performed during the hospital encounter of 12/04/17 (from the past 48 hour(s))  Glucose, capillary     Status: Abnormal   Collection Time: 12/08/17 12:04 PM  Result Value Ref Range   Glucose-Capillary 183 (H) 65 - 99 mg/dL  Glucose, capillary     Status: Abnormal   Collection Time: 12/08/17  5:00 PM  Result Value Ref Range   Glucose-Capillary 292 (H) 65 - 99 mg/dL  Glucose, capillary     Status: Abnormal   Collection Time: 12/08/17  9:17 PM  Result Value Ref Range   Glucose-Capillary 190 (H) 65 - 99 mg/dL  CBC     Status: Abnormal   Collection Time: 12/09/17  4:50 AM  Result Value Ref Range   WBC 8.9 4.0 - 10.5 K/uL   RBC 2.96 (L) 3.87 - 5.11 MIL/uL   Hemoglobin 8.1 (L) 12.0 - 15.0 g/dL   HCT 78.2 (L) 95.6 - 21.3 %   MCV 80.1 78.0 - 100.0 fL   MCH 27.4 26.0 - 34.0 pg   MCHC 34.2 30.0 - 36.0 g/dL   RDW 08.6 57.8 - 46.9 %   Platelets 228 150 - 400 K/uL  Basic metabolic panel     Status: Abnormal   Collection Time: 12/09/17  4:50 AM  Result Value Ref Range   Sodium 131 (L) 135 - 145 mmol/L   Potassium 4.7 3.5 - 5.1 mmol/L   Chloride 108 101 - 111 mmol/L   CO2 17 (L) 22 - 32 mmol/L   Glucose, Bld 218 (H) 65 - 99 mg/dL    BUN 8 6 - 20 mg/dL   Creatinine, Ser 6.29 0.44 - 1.00 mg/dL   Calcium 7.8 (L) 8.9 - 10.3 mg/dL   GFR calc non Af Amer >60 >60 mL/min   GFR calc Af Amer >60 >60 mL/min    Comment: (NOTE) The eGFR has been calculated using the CKD EPI equation. This calculation has not been validated in all clinical situations. eGFR's persistently <60 mL/min signify possible Chronic Kidney Disease.    Anion gap 6 5 - 15  Glucose, capillary     Status: Abnormal   Collection Time: 12/09/17  8:04 AM  Result Value Ref Range   Glucose-Capillary 212 (H) 65 - 99 mg/dL  Glucose, capillary     Status: Abnormal   Collection Time: 12/09/17 11:52 AM  Result Value Ref Range   Glucose-Capillary 153 (H) 65 - 99 mg/dL  Glucose, capillary     Status: Abnormal   Collection Time: 12/09/17  4:39 PM  Result Value Ref Range   Glucose-Capillary 100 (H) 65 - 99 mg/dL  Glucose, capillary     Status: Abnormal   Collection  Time: 12/09/17  9:35 PM  Result Value Ref Range   Glucose-Capillary 173 (H) 65 - 99 mg/dL  Basic metabolic panel     Status: Abnormal   Collection Time: 12/10/17  6:55 AM  Result Value Ref Range   Sodium 130 (L) 135 - 145 mmol/L   Potassium 4.7 3.5 - 5.1 mmol/L   Chloride 106 101 - 111 mmol/L   CO2 18 (L) 22 - 32 mmol/L   Glucose, Bld 242 (H) 65 - 99 mg/dL   BUN 7 6 - 20 mg/dL   Creatinine, Ser 1.61 0.44 - 1.00 mg/dL   Calcium 7.9 (L) 8.9 - 10.3 mg/dL   GFR calc non Af Amer >60 >60 mL/min   GFR calc Af Amer >60 >60 mL/min    Comment: (NOTE) The eGFR has been calculated using the CKD EPI equation. This calculation has not been validated in all clinical situations. eGFR's persistently <60 mL/min signify possible Chronic Kidney Disease.    Anion gap 6 5 - 15  CBC     Status: Abnormal   Collection Time: 12/10/17  6:55 AM  Result Value Ref Range   WBC 11.7 (H) 4.0 - 10.5 K/uL   RBC 3.26 (L) 3.87 - 5.11 MIL/uL   Hemoglobin 8.8 (L) 12.0 - 15.0 g/dL   HCT 09.6 (L) 04.5 - 40.9 %   MCV 79.8 78.0  - 100.0 fL   MCH 27.0 26.0 - 34.0 pg   MCHC 33.8 30.0 - 36.0 g/dL   RDW 81.1 91.4 - 78.2 %   Platelets 296 150 - 400 K/uL  Glucose, capillary     Status: Abnormal   Collection Time: 12/10/17  7:58 AM  Result Value Ref Range   Glucose-Capillary 236 (H) 65 - 99 mg/dL    Radiology/Results: No results found.  Anti-infectives: Anti-infectives (From admission, onward)   Start     Dose/Rate Route Frequency Ordered Stop   12/04/17 1600  ceFAZolin (ANCEF) IVPB 2g/100 mL premix     2 g 200 mL/hr over 30 Minutes Intravenous Every 8 hours 12/04/17 1344 12/04/17 1746   12/04/17 0643  ceFAZolin (ANCEF) IVPB 2g/100 mL premix     2 g 200 mL/hr over 30 Minutes Intravenous On call to O.R. 12/04/17 0643 12/04/17 0825      Assessment/Plan: Problem List: Patient Active Problem List   Diagnosis Date Noted  . Protein-calorie malnutrition, severe 12/06/2017  . Small bowel mass 12/04/2017  . Protein-calorie malnutrition (HCC) 09/11/2017  . Weight loss 09/11/2017  . Diabetic neuropathy (HCC) 09/28/2015  . Hip pain 09/21/2014  . Muscle ache of extremity 06/12/2014  . Anxiety state 10/06/2013  . Bladder prolapse, female, acquired 08/03/2013  . Osteoporosis, unspecified 08/03/2013  . Nonspecific (abnormal) findings on radiological and other examination of biliary tract 04/17/2013  . Diabetes with neurologic complications (HCC) 09/18/2011  . Hypertension associated with diabetes (HCC) 09/18/2011  . Hyperlipidemia LDL goal <70 09/18/2011  . Glaucoma 09/18/2011    Advance to full liquid diet.  Patient lives with family.  May be able to go home soon.   6 Days Post-Op    LOS: 6 days   Matt B. Daphine Deutscher, MD, [facility] [phone] beeper [phone]  104/26/202018 9:35 AM

## 2017-12-10 NOTE — Progress Notes (Signed)
Nutrition Follow-up  DOCUMENTATION CODES:   Severe malnutrition in context of chronic illness  INTERVENTION:   -D/c Boost Breeze po TID, each supplement provides 250 kcal and 9 grams of protein -Ensure Enlive po TID, each supplement provides 350 kcal and 20 grams of protein  NUTRITION DIAGNOSIS:   Severe Malnutrition related to chronic illness(abdominal mass) as evidenced by energy intake < 75% for > or equal to 1 month, moderate fat depletion, severe fat depletion, moderate muscle depletion, severe muscle depletion.  Ongoing  GOAL:   Patient will meet greater than or equal to 90% of their needs  Progressing  MONITOR:   PO intake, Supplement acceptance, Diet advancement, Labs, Weight trends, Skin, I & O's  REASON FOR ASSESSMENT:   Malnutrition Screening Tool    ASSESSMENT:   The patient is a 83 year old female who presents with an abdominal mass.   S/p Procedure(s) 12/04/17: Diagnostic laparoscopy and small bowel resection, partial omentectomy  Per general surgery notes, pt with low grade neuroendocrine tumor with implants and + LN residual low volume carcinomatosis.   Pt sleeping soundly at time of visit. She was just advanced to a full liquid diet this morning. Meal completion has been very poor (PO: 0-10%). Pt has been accepting Boost Breeze supplements. Due to poor oral intake and malnutrition, pt would benefit from nutrient dense supplement. One Ensure Enlive supplement provides 350 kcals, 20 grams protein, and 44-45 grams of carbohydrate vs one Glucerna shake supplement, which provides 220 kcals, 10 grams of protein, and 26 grams of carbohydrate. Given pt's hx of DM, RD will continue to monitor PO intake, CBGS, and adjust supplement regimen as appropriate.   Labs reviewed: Na: 130 (on IV supplementation), CBGS: 100-236 (inpatient orders for glycemic control are 0-9 units insulin aspart TID with meals, 6 units insulin glargine daily).   Diet Order:  Diet full liquid  Room service appropriate? Yes; Fluid consistency: Thin  EDUCATION NEEDS:   Education needs have been addressed  Skin:  Skin Assessment: Skin Integrity Issues: Skin Integrity Issues:: Incisions Incisions: closed abdomen  Last BM:  12/08/17  Height:   Ht Readings from Last 1 Encounters:  12/04/17 5\' 5"  (1.651 m)    Weight:   Wt Readings from Last 1 Encounters:  12/10/17 127 lb (57.6 kg)    Ideal Body Weight:  56.8 kg  BMI:  Body mass index is 21.13 kg/m.  Estimated Nutritional Needs:   Kcal:  1400-1600  Protein:  65-80 grams  Fluid:  > 1.4 L    Theresa Howe, RD, LDN, CDE Pager: [phone] After hours Pager: [phone]

## 2017-12-10 NOTE — Progress Notes (Signed)
Pt had a one time episode of confusion this morning. Pt keep saying "How do I get out of here". Reoriented pt.

## 2017-12-10 NOTE — Progress Notes (Signed)
Pt ate less then 10% of her meals today, she refused majority of her nutritional drinks.  She should take a couple of sips and say that was all she would drink. She vomited a little of her dinner, and according to her niece there as some foam substance that came up prior to small amnt of vomit.

## 2017-12-10 NOTE — NC FL2 (Signed)
[place] MEDICAID FL2 LEVEL OF CARE SCREENING TOOL     IDENTIFICATION  Patient Name: Theresa Howe Birthdate: 08-25-1931 Sex: female Admission Date (Current Location): 12/04/2017  [facility] and [place] Number:  [employer] and Address:  [facility], [address], [place], [place] [address]      Provider Number: [id number]  Attending Physician Name and Address:  Almond Lint, MD  Relative Name and Phone Number:       Current Level of Care: Hospital Recommended Level of Care: Skilled Nursing Facility Prior Approval Number:    Date Approved/Denied:   PASRR Number: [id number]  Discharge Plan: SNF    Current Diagnoses: Patient Active Problem List   Diagnosis Date Noted  . Protein-calorie malnutrition, severe 12/06/2017  . Small bowel mass 12/04/2017  . Protein-calorie malnutrition (HCC) 09/11/2017  . Weight loss 09/11/2017  . Diabetic neuropathy (HCC) 09/28/2015  . Hip pain 09/21/2014  . Muscle ache of extremity 06/12/2014  . Anxiety state 10/06/2013  . Bladder prolapse, female, acquired 08/03/2013  . Osteoporosis, unspecified 08/03/2013  . Nonspecific (abnormal) findings on radiological and other examination of biliary tract 04/17/2013  . Diabetes with neurologic complications (HCC) 09/18/2011  . Hypertension associated with diabetes (HCC) 09/18/2011  . Hyperlipidemia LDL goal <70 09/18/2011  . Glaucoma 09/18/2011    Orientation RESPIRATION BLADDER Height & Weight     Self, Situation  Normal Continent Weight: 127 lb (57.6 kg) Height:  5\' 5"  (165.1 cm)  BEHAVIORAL SYMPTOMS/MOOD NEUROLOGICAL BOWEL NUTRITION STATUS      Continent Diet(see DC summary)  AMBULATORY STATUS COMMUNICATION OF NEEDS Skin   Extensive Assist Verbally Surgical wounds(abdominal wound with adhesive bandage)                       Personal Care Assistance Level of Assistance  Bathing, Dressing Bathing Assistance: Maximum assistance   Dressing  Assistance: Maximum assistance     Functional Limitations Info             SPECIAL CARE FACTORS FREQUENCY  PT (By licensed PT), OT (By licensed OT)     PT Frequency: 5/wk OT Frequency: 5/wk            Contractures      Additional Factors Info  Code Status, Allergies, Insulin Sliding Scale Code Status Info: FULL Allergies Info: NKA   Insulin Sliding Scale Info: 3/day       Current Medications (12020/03/2517):  This is the current hospital active medication list Current Facility-Administered Medications  Medication Dose Route Frequency Provider Last Rate Last Dose  . acetaminophen (TYLENOL) tablet 650 mg  650 mg Oral Q6H PRN Almond Lint, MD   650 mg at 12/05/17 1901   Or  . acetaminophen (TYLENOL) suppository 650 mg  650 mg Rectal Q6H PRN Almond Lint, MD      . albuterol (PROVENTIL) (2.5 MG/3ML) 0.083% nebulizer solution 2.5 mg  2.5 mg Nebulization Q6H PRN Almond Lint, MD      . dextrose 5 % and 0.45 % NaCl with KCl 40 mEq/L infusion   Intravenous Continuous Almond Lint, MD 75 mL/hr at 12/10/17 1006    . diphenhydrAMINE (BENADRYL) 12.5 MG/5ML elixir 12.5 mg  12.5 mg Oral Q6H PRN Almond Lint, MD       Or  . diphenhydrAMINE (BENADRYL) injection 12.5 mg  12.5 mg Intravenous Q6H PRN Almond Lint, MD      . dorzolamide (TRUSOPT) 2 % ophthalmic solution 1 drop  1 drop Both Eyes BID Almond Lint, MD   1 drop at 12/10/17 1006  . enoxaparin (LOVENOX) injection 40 mg  40 mg Subcutaneous Q24H Almond Lint, MD   40 mg at 12/10/17 0853  . escitalopram (LEXAPRO) tablet 10 mg  10 mg Oral QHS Almond Lint, MD   10 mg at 12/09/17 2003  . feeding supplement (ENSURE ENLIVE) (ENSURE ENLIVE) liquid 237 mL  237 mL Oral TID BM Almond Lint, MD      . fluticasone (FLONASE) 50 MCG/ACT nasal spray 2 spray  2 spray Each Nare Daily Almond Lint, MD   2 spray at 12/10/17 1006  . gabapentin (NEURONTIN) capsule 200 mg  200 mg Oral BID Almond Lint, MD   200 mg at 12/10/17 1005  .  hydrALAZINE (APRESOLINE) injection 10 mg  10 mg Intravenous Q2H PRN Almond Lint, MD      . insulin aspart (novoLOG) injection 0-9 Units  0-9 Units Subcutaneous TID WC Almond Lint, MD   3 Units at 12/10/17 1203  . insulin glargine (LANTUS) injection 6 Units  6 Units Subcutaneous Daily Almond Lint, MD   6 Units at 12/10/17 1006  . latanoprost (XALATAN) 0.005 % ophthalmic solution 1 drop  1 drop Both Eyes QHS Almond Lint, MD   1 drop at 12/09/17 2004  . methocarbamol (ROBAXIN) tablet 500 mg  500 mg Oral Q6H PRN Almond Lint, MD   500 mg at 12/09/17 2002  . mirtazapine (REMERON) tablet 7.5 mg  7.5 mg Oral QHS Almond Lint, MD   7.5 mg at 12/09/17 2002  . ondansetron (ZOFRAN-ODT) disintegrating tablet 4 mg  4 mg Oral Q6H PRN Almond Lint, MD       Or  . ondansetron (ZOFRAN) injection 4 mg  4 mg Intravenous Q6H PRN Almond Lint, MD   4 mg at 12/06/17 1920  . oxyCODONE (ROXICODONE) 5 MG/5ML solution 5 mg  5 mg Oral Q6H Almond Lint, MD   5 mg at 12/10/17 0845  . prochlorperazine (COMPAZINE) injection 5 mg  5 mg Intravenous Q6H PRN Almond Lint, MD      . simethicone (MYLICON) chewable tablet 40 mg  40 mg Oral Q6H PRN Almond Lint, MD         Discharge Medications: Please see discharge summary for a list of discharge medications.  Relevant Imaging Results:  Relevant Lab Results:   Additional Information SS#: [id number]  Burna Sis, LCSW

## 2017-12-10 NOTE — Progress Notes (Signed)
Physical Therapy Treatment Patient Details Name: Theresa Howe MRN: [id number] DOB: 1931/04/03 Today's Date: 123-Jan-202018    History of Present Illness Pt is an 83 y/o female who presents with an abdominal mass and 25-30 pound weight loss. CT revealed a mesenteric mass and small bowel mass, dilated pancreatic duct, and mass in the liver. The patient is now s/p small bowel resection on 12/04/17.    PT Comments    Pt required increased assistance this session for transfers and minimal ambulation with RW. Pt appears confused at times with inappropriate response to commands (pt began doffing gown when instructed to sit up EOB). SNF remains the most appropriate d/c disposition.   Follow Up Recommendations  SNF;Supervision/Assistance - 24 hour     Equipment Recommendations  Rolling walker with 5" wheels;3in1 (PT)    Recommendations for Other Services       Precautions / Restrictions Precautions Precautions: Fall Precaution Comments: Abdominial incision Restrictions Weight Bearing Restrictions: No    Mobility  Bed Mobility Overal bed mobility: Needs Assistance Bed Mobility: Supine to Sit     Supine to sit: Min assist     General bed mobility comments: Pt required hand over hand assist for use of rails and step-by-step cueing to complete transfer to EOB.   Transfers Overall transfer level: Needs assistance Equipment used: Rolling walker (2 wheeled) Transfers: Sit to/from Stand Sit to Stand: Mod assist         General transfer comment: VC's for hand placement on seated surface for safety. Pt required heavy mod assist to power up to full standing position and place hands on the walker. Pt not wanting to keep hands on the walker and consistently attempting to pull robe around her to cover her even though she was fully covered front and back.   Ambulation/Gait Ambulation/Gait assistance: Mod assist Ambulation Distance (Feet): 10 Feet(5 feet x2) Assistive device: Rolling  walker (2 wheeled) Gait Pattern/deviations: Step-to pattern;Decreased stride length;Shuffle;Trunk flexed;Narrow base of support Gait velocity: Decreased Gait velocity interpretation: Below normal speed for age/gender General Gait Details: Pt required multimodal cues to ambulate bed to chair. Assist provided for balance, advancing walker, and walker position during turns.    Stairs            Wheelchair Mobility    Modified Rankin (Stroke Patients Only)       Balance Overall balance assessment: Needs assistance Sitting-balance support: Feet supported;No upper extremity supported Sitting balance-Leahy Scale: Poor     Standing balance support: Bilateral upper extremity supported;During functional activity Standing balance-Leahy Scale: Poor                              Cognition Arousal/Alertness: Awake/alert Behavior During Therapy: WFL for tasks assessed/performed Overall Cognitive Status: Impaired/Different from baseline Area of Impairment: Following commands;Safety/judgement;Awareness;Problem solving                       Following Commands: Follows one step commands inconsistently;Follows one step commands with increased time Safety/Judgement: Decreased awareness of safety;Decreased awareness of deficits Awareness: Intellectual Problem Solving: Slow processing;Decreased initiation;Difficulty sequencing;Requires verbal cues General Comments: Pt distracted and appeared confused. When PT told pt we were going to sit up, pt immediately began doffing gown. When standing, pt perseverating on if she was covered, even though pt had a gown and robe donned. Not consistently answering questions       Exercises      General Comments  Pertinent Vitals/Pain Pain Assessment: Faces Faces Pain Scale: Hurts little more Pain Location: Abdomen Pain Descriptors / Indicators: Operative site guarding;Grimacing Pain Intervention(s): Limited activity within  patient's tolerance;Monitored during session;Repositioned    Home Living                      Prior Function            PT Goals (current goals can now be found in the care plan section) Acute Rehab PT Goals Patient Stated Goal: Get better, go to niece's house PT Goal Formulation: With patient/family Time For Goal Achievement: 12/22/17 Potential to Achieve Goals: Good Progress towards PT goals: Progressing toward goals    Frequency    Min 2X/week      PT Plan Current plan remains appropriate    Co-evaluation              AM-PAC PT "6 Clicks" Daily Activity  Outcome Measure  Difficulty turning over in bed (including adjusting bedclothes, sheets and blankets)?: Unable Difficulty moving from lying on back to sitting on the side of the bed? : Unable Difficulty sitting down on and standing up from a chair with arms (e.g., wheelchair, bedside commode, etc,.)?: Unable Help needed moving to and from a bed to chair (including a wheelchair)?: A Little Help needed walking in hospital room?: A Little Help needed climbing 3-5 steps with a railing? : Total 6 Click Score: 10    End of Session Equipment Utilized During Treatment: Gait belt Activity Tolerance: Patient tolerated treatment well Patient left: in bed;with call bell/phone within reach;with bed alarm set Nurse Communication: Mobility status PT Visit Diagnosis: Unsteadiness on feet (R26.81);Difficulty in walking, not elsewhere classified (R26.2);Pain Pain - part of body: (abdomen)     Time: 9381-0175 PT Time Calculation (min) (ACUTE ONLY): 30 min  Charges:  $Gait Training: 23-37 mins                    G Codes:       Conni Slipper, PT, DPT Acute Rehabilitation Services Pager: [phone]    Theresa Howe 103/03/2017, 11:32 AM

## 2017-12-11 LAB — GLUCOSE, CAPILLARY
GLUCOSE-CAPILLARY: 139 mg/dL — AB (ref 65–99)
GLUCOSE-CAPILLARY: 172 mg/dL — AB (ref 65–99)
Glucose-Capillary: 181 mg/dL — ABNORMAL HIGH (ref 65–99)
Glucose-Capillary: 132 mg/dL — ABNORMAL HIGH (ref 65–99)

## 2017-12-11 LAB — CREATININE, SERUM
CREATININE: 0.64 mg/dL (ref 0.44–1.00)
GFR calc Af Amer: >60 mL/min (ref >60)
GFR calc non Af Amer: >60 mL/min (ref >60)

## 2017-12-11 NOTE — Progress Notes (Signed)
Notified Kinsinger via text page pt's BP 90/57, HR 108, T 98.5, R 16, O2 99% on RA. Kinsinger returned call. No further orders given; stated to continue to monitor patient.

## 2017-12-11 NOTE — Progress Notes (Signed)
Patient ID: Theresa Howe, female   DOB: 1931/10/16, 83 y.o.   MRN: [id number] [facility] Surgery Progress Note:   7 Days Post-Op  Subjective: Mental status is fairly clear and coherent.  No complaints.  No nausea or vomiting complaints Objective: Vital signs in last 24 hours: Temp:  [98.4 F (36.9 C)-98.5 F (36.9 C)] 98.4 F (36.9 C) (12/25 0450) Pulse Rate:  [108-110] 109 (12/25 0450) Resp:  [16-17] 17 (12/25 0450) BP: (90-116)/(54-72) 97/54 (12/25 0450) SpO2:  [93 %-99 %] 93 % (12/25 0450)  Intake/Output from previous day: 12/24 0701 - 12/25 0700 In: 1905 [P.O.:150; I.V.:1755] Out: 100 [Urine:100] Intake/Output this shift: No intake/output data recorded.  Physical Exam: Work of breathing is normal  Abdomen is flat   Lab Results:  Results for orders placed or performed during the hospital encounter of 12/04/17 (from the past 48 hour(s))  Glucose, capillary     Status: Abnormal   Collection Time: 12/09/17 11:52 AM  Result Value Ref Range   Glucose-Capillary 153 (H) 65 - 99 mg/dL  Glucose, capillary     Status: Abnormal   Collection Time: 12/09/17  4:39 PM  Result Value Ref Range   Glucose-Capillary 100 (H) 65 - 99 mg/dL  Glucose, capillary     Status: Abnormal   Collection Time: 12/09/17  9:35 PM  Result Value Ref Range   Glucose-Capillary 173 (H) 65 - 99 mg/dL  Basic metabolic panel     Status: Abnormal   Collection Time: 12/10/17  6:55 AM  Result Value Ref Range   Sodium 130 (L) 135 - 145 mmol/L   Potassium 4.7 3.5 - 5.1 mmol/L   Chloride 106 101 - 111 mmol/L   CO2 18 (L) 22 - 32 mmol/L   Glucose, Bld 242 (H) 65 - 99 mg/dL   BUN 7 6 - 20 mg/dL   Creatinine, Ser 4.09 0.44 - 1.00 mg/dL   Calcium 7.9 (L) 8.9 - 10.3 mg/dL   GFR calc non Af Amer >60 >60 mL/min   GFR calc Af Amer >60 >60 mL/min    Comment: (NOTE) The eGFR has been calculated using the CKD EPI equation. This calculation has not been validated in all clinical situations. eGFR's persistently <60  mL/min signify possible Chronic Kidney Disease.    Anion gap 6 5 - 15  CBC     Status: Abnormal   Collection Time: 12/10/17  6:55 AM  Result Value Ref Range   WBC 11.7 (H) 4.0 - 10.5 K/uL   RBC 3.26 (L) 3.87 - 5.11 MIL/uL   Hemoglobin 8.8 (L) 12.0 - 15.0 g/dL   HCT 81.1 (L) 91.4 - 78.2 %   MCV 79.8 78.0 - 100.0 fL   MCH 27.0 26.0 - 34.0 pg   MCHC 33.8 30.0 - 36.0 g/dL   RDW 95.6 21.3 - 08.6 %   Platelets 296 150 - 400 K/uL  Glucose, capillary     Status: Abnormal   Collection Time: 12/10/17  7:58 AM  Result Value Ref Range   Glucose-Capillary 236 (H) 65 - 99 mg/dL  Glucose, capillary     Status: Abnormal   Collection Time: 12/10/17 11:49 AM  Result Value Ref Range   Glucose-Capillary 227 (H) 65 - 99 mg/dL  Glucose, capillary     Status: Abnormal   Collection Time: 12/10/17  5:26 PM  Result Value Ref Range   Glucose-Capillary 170 (H) 65 - 99 mg/dL  Glucose, capillary     Status: Abnormal   Collection  Time: 12/10/17  8:49 PM  Result Value Ref Range   Glucose-Capillary 123 (H) 65 - 99 mg/dL  Creatinine, serum     Status: None   Collection Time: 12/11/17  7:56 AM  Result Value Ref Range   Creatinine, Ser 0.64 0.44 - 1.00 mg/dL   GFR calc non Af Amer >60 >60 mL/min   GFR calc Af Amer >60 >60 mL/min    Comment: (NOTE) The eGFR has been calculated using the CKD EPI equation. This calculation has not been validated in all clinical situations. eGFR's persistently <60 mL/min signify possible Chronic Kidney Disease.   Glucose, capillary     Status: Abnormal   Collection Time: 12/11/17  8:31 AM  Result Value Ref Range   Glucose-Capillary 181 (H) 65 - 99 mg/dL    Radiology/Results: No results found.  Anti-infectives: Anti-infectives (From admission, onward)   Start     Dose/Rate Route Frequency Ordered Stop   12/04/17 1600  ceFAZolin (ANCEF) IVPB 2g/100 mL premix     2 g 200 mL/hr over 30 Minutes Intravenous Every 8 hours 12/04/17 1344 12/04/17 1746   12/04/17 0643   ceFAZolin (ANCEF) IVPB 2g/100 mL premix     2 g 200 mL/hr over 30 Minutes Intravenous On call to O.R. 12/04/17 0643 12/04/17 0825      Assessment/Plan: Problem List: Patient Active Problem List   Diagnosis Date Noted  . Protein-calorie malnutrition, severe 12/06/2017  . Small bowel mass 12/04/2017  . Protein-calorie malnutrition (HCC) 09/11/2017  . Weight loss 09/11/2017  . Diabetic neuropathy (HCC) 09/28/2015  . Hip pain 09/21/2014  . Muscle ache of extremity 06/12/2014  . Anxiety state 10/06/2013  . Bladder prolapse, female, acquired 08/03/2013  . Osteoporosis, unspecified 08/03/2013  . Nonspecific (abnormal) findings on radiological and other examination of biliary tract 04/17/2013  . Diabetes with neurologic complications (HCC) 09/18/2011  . Hypertension associated with diabetes (HCC) 09/18/2011  . Hyperlipidemia LDL goal <70 09/18/2011  . Glaucoma 09/18/2011    Will advance to regular diet.   7 Days Post-Op    LOS: 7 days   Matt B. Daphine Deutscher, MD, [facility] [phone] beeper [phone]  12/11/2017 9:14 AM

## 2017-12-12 LAB — GLUCOSE, CAPILLARY
GLUCOSE-CAPILLARY: 199 mg/dL — AB (ref 65–99)
GLUCOSE-CAPILLARY: 151 mg/dL — AB (ref 65–99)
GLUCOSE-CAPILLARY: 153 mg/dL — AB (ref 65–99)
Glucose-Capillary: 95 mg/dL (ref 65–99)

## 2017-12-12 MED ORDER — DOCUSATE SODIUM 100 MG PO CAPS
100.0000 mg | ORAL_CAPSULE | Freq: Two times a day (BID) | ORAL | Status: DC
  Administered 2017-12-12 – 2017-12-13 (×3): 100 mg via ORAL
  Filled 2017-12-12 (×3): qty 1

## 2017-12-12 MED ORDER — POLYETHYLENE GLYCOL 3350 17 G PO PACK
17.0000 g | PACK | Freq: Every day | ORAL | Status: DC
  Administered 2017-12-12 – 2017-12-13 (×2): 17 g via ORAL
  Filled 2017-12-12 (×2): qty 1

## 2017-12-12 NOTE — Social Work (Signed)
CSW spoke with pt nephew Theresa Howe, nieces and nephews would like placement at [facility].   [facility] aware and expresses that they will be able to take pt at discharge tomorrow.   CSW continuing to follow.   Alexander Mt, [employer] Work [phone]

## 2017-12-12 NOTE — Progress Notes (Signed)
PT Cancellation Note  Patient Details Name: Theresa Howe MRN: [id number] DOB: [birth year]   Cancelled Treatment:    Reason Eval/Treat Not Completed: Patient declined, no reason specified. Attempted to work with patient at [employer], patient refusing citing she is comfortable and wishes to remain in bed. Agreeable to work tomorrow 12/27.   Reinaldo Berber, PT, DPT Acute Rehab Services Pager: [phone]     Reinaldo Berber 12/12/2017, 6:37 PM

## 2017-12-12 NOTE — Clinical Social Work Note (Signed)
Clinical Social Work Assessment  Patient Details  Name: Theresa Howe MRN: [id number] Date of Birth: April 07, 1931  Date of referral:  12/12/17               Reason for consult:  Facility Placement                Permission sought to share information with:  Family Supports, Chartered certified accountant granted to share information::  Yes, Verbal Permission Granted  Name::        Agency::  SNF's  Relationship::  neices & nephews   Contact Information:     Housing/Transportation Living arrangements for the past 2 months:  [place] of Information:  Patient, Other (Comment Required)(nephew) Patient Interpreter Needed:  None Criminal Activity/Legal Involvement Pertinent to Current Situation/Hospitalization:  No - Comment as needed Significant Relationships:  Church, Other Family Members Lives with:  Self Do you feel safe going back to the place where you live?  Yes Need for family participation in patient care:  Yes (Comment)(decision making; mobility)  Care giving concerns: Pt lives alone, has some memory difficulties and needs support with mobility. SNF is recommended for safety and therapies.    Social Worker assessment / plan: CSW met with pt and one of her nephews (states he is POA) by the bedside. Pt quiet and confused. Pt nephew stated that pt had been living in his old home in [place], but that the nieces and nephews that support her care are all located in [place]. Pt nephew was interested in seeing SNF list and also wanted support with HCPOA/POA. Pt nephew stated that he would communicate with other neices and nephews to talk about care options. CSW will page chaplain re: HCPOA/POA. Pt nephew had no further concerns.  Employment status:  Retired Forensic scientist:  Medicare PT Recommendations:  [place] / Referral to community resources:  [place]  Patient/Family's Response to care:  Pt family is  supportive and appreciative of CSW and SNF recommendations.   Patient/Family's Understanding of and Emotional Response to Diagnosis, Current Treatment, and Prognosis:  Pt nephew states understanding of diagnosis, current treatment, and prognosis. Pt nephew was very calm and held pt hand during assessment. Pt nephew states that he will continue to communicate pt care decisions and make them with pt's other nieces and nephews.   Emotional Assessment Appearance:  Appears stated age Attitude/Demeanor/Rapport:  (quiet; slightly confused) Affect (typically observed):  Calm, Quiet Orientation:  Oriented to Self Alcohol / Substance use:  Never Used Psych involvement (Current and /or in the community):  No (Comment)  Discharge Needs  Concerns to be addressed:  Discharge Planning Concerns, Care Coordination Readmission within the last 30 days:  No Current discharge risk:  Lives alone, Cognitively Impaired Barriers to Discharge:  Ship broker, Continued Medical Work up   [employer], Tat Momoli 12/12/2017, 12:17 PM

## 2017-12-12 NOTE — Progress Notes (Signed)
[facility] Progress Note  8 Days Post-Op  Subjective: CC: No new complaints Patient denies abdominal pain. Tolerating regular diet. Having bowel function and no nausea. Lots of questions from family about further treatment. VSS.   Objective: Vital signs in last 24 hours: Temp:  [98.4 F (36.9 C)-99.1 F (37.3 C)] 99.1 F (37.3 C) (12/26 0559) Pulse Rate:  [108-109] 109 (12/26 0559) Resp:  [18] 18 (12/26 0559) BP: (107-118)/(54-80) 107/68 (12/26 0559) SpO2:  [98 %-99 %] 99 % (12/26 0559) Last BM Date: 12/08/17  Intake/Output from previous day: 12/25 0701 - 12/26 0700 In: 7989 [P.O.:300; I.V.:1470] Out: -  Intake/Output this shift: No intake/output data recorded.  PE: Gen:  Alert, NAD, pleasant Card:  Regular rate and rhythm, pedal pulses 2+ BL Pulm:  Normal effort, clear to auscultation bilaterally Abd: Soft, non-tender, non-distended, bowel sounds present in all 4 quadrants, no HSM, incisions C/D/I Skin: warm and dry, no rashes  Psych: A&Ox3   Lab Results:  Recent Labs    12/10/17 0655  WBC 11.7*  HGB 8.8*  HCT 26.0*  PLT 296   BMET Recent Labs    12/10/17 0655 12/11/17 0756  NA 130*  --   K 4.7  --   CL 106  --   CO2 18*  --   GLUCOSE 242*  --   BUN 7  --   CREATININE 0.68 0.64  CALCIUM 7.9*  --    PT/INR No results for input(s): LABPROT, INR in the last 72 hours. CMP     Component Value Date/Time   NA 130 (L) 1Apr 25, 202018 0655   K 4.7 1Apr 25, 202018 0655   CL 106 1Apr 25, 202018 0655   CO2 18 (L) 1Apr 25, 202018 0655   GLUCOSE 242 (H) 1Apr 25, 202018 0655   BUN 7 1Apr 25, 202018 0655   CREATININE 0.64 12/11/2017 0756   CREATININE 0.97 (H) 10/05/2017 1400   CALCIUM 7.9 (L) 1Apr 25, 202018 0655   PROT 6.7 11/28/2017 1220   ALBUMIN 3.4 (L) 11/28/2017 1220   AST 30 11/28/2017 1220   ALT 18 11/28/2017 1220   ALKPHOS 61 11/28/2017 1220   BILITOT 0.8 11/28/2017 1220   GFRNONAA >60 12/11/2017 0756   GFRNONAA 54 (L) 10/05/2017 1400   GFRAA >60 12/11/2017 0756    GFRAA 62 10/05/2017 1400   Lipase     Component Value Date/Time   LIPASE 54 10/05/2017 1400       Studies/Results: No results found.  Anti-infectives: Anti-infectives (From admission, onward)   Start     Dose/Rate Route Frequency Ordered Stop   12/04/17 1600  ceFAZolin (ANCEF) IVPB 2g/100 mL premix     2 g 200 mL/hr over 30 Minutes Intravenous Every 8 hours 12/04/17 1344 12/04/17 1746   12/04/17 0643  ceFAZolin (ANCEF) IVPB 2g/100 mL premix     2 g 200 mL/hr over 30 Minutes Intravenous On call to O.R. 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 Dr. Barry Dienes, 12/18 - patient is tolerating a diet and pain is well controlled - oncology working on outpatient follow up - patient is stable for discharge to SNF from a surgical standpoint.   FEN: regular diet VTE: SCD's, lovenox ID: Ancef pre-op only Follow up: Dr. Barry Dienes    LOS: 8 days    Brigid Re , [facility]  12/12/2017, 12:57 PM Pager: [phone] Consults: [phone] Mon-Fri 7:00 am-4:30 pm Sat-Sun 7:00 am-11:30 am

## 2017-12-13 DIAGNOSIS — R1312 Dysphagia, oropharyngeal phase: Secondary | ICD-10-CM | POA: Diagnosis not present

## 2017-12-13 DIAGNOSIS — R509 Fever, unspecified: Secondary | ICD-10-CM | POA: Diagnosis not present

## 2017-12-13 DIAGNOSIS — D649 Anemia, unspecified: Secondary | ICD-10-CM | POA: Diagnosis not present

## 2017-12-13 DIAGNOSIS — R188 Other ascites: Secondary | ICD-10-CM | POA: Diagnosis present

## 2017-12-13 DIAGNOSIS — Z7189 Other specified counseling: Secondary | ICD-10-CM | POA: Diagnosis not present

## 2017-12-13 DIAGNOSIS — Z4659 Encounter for fitting and adjustment of other gastrointestinal appliance and device: Secondary | ICD-10-CM | POA: Diagnosis not present

## 2017-12-13 DIAGNOSIS — R112 Nausea with vomiting, unspecified: Secondary | ICD-10-CM | POA: Diagnosis not present

## 2017-12-13 DIAGNOSIS — Z789 Other specified health status: Secondary | ICD-10-CM | POA: Diagnosis not present

## 2017-12-13 DIAGNOSIS — R262 Difficulty in walking, not elsewhere classified: Secondary | ICD-10-CM | POA: Diagnosis not present

## 2017-12-13 DIAGNOSIS — C800 Disseminated malignant neoplasm, unspecified: Secondary | ICD-10-CM | POA: Diagnosis not present

## 2017-12-13 DIAGNOSIS — R64 Cachexia: Secondary | ICD-10-CM | POA: Diagnosis present

## 2017-12-13 DIAGNOSIS — T183XXA Foreign body in small intestine, initial encounter: Secondary | ICD-10-CM | POA: Diagnosis present

## 2017-12-13 DIAGNOSIS — K5649 Other impaction of intestine: Secondary | ICD-10-CM | POA: Diagnosis not present

## 2017-12-13 DIAGNOSIS — K56609 Unspecified intestinal obstruction, unspecified as to partial versus complete obstruction: Secondary | ICD-10-CM | POA: Diagnosis not present

## 2017-12-13 DIAGNOSIS — R100 Acute abdomen: Secondary | ICD-10-CM | POA: Diagnosis not present

## 2017-12-13 DIAGNOSIS — C7A098 Malignant carcinoid tumors of other sites: Secondary | ICD-10-CM | POA: Diagnosis not present

## 2017-12-13 DIAGNOSIS — R110 Nausea: Secondary | ICD-10-CM | POA: Diagnosis not present

## 2017-12-13 DIAGNOSIS — R5383 Other fatigue: Secondary | ICD-10-CM | POA: Diagnosis not present

## 2017-12-13 DIAGNOSIS — R627 Adult failure to thrive: Secondary | ICD-10-CM | POA: Diagnosis not present

## 2017-12-13 DIAGNOSIS — T8149XA Infection following a procedure, other surgical site, initial encounter: Secondary | ICD-10-CM | POA: Diagnosis not present

## 2017-12-13 DIAGNOSIS — C772 Secondary and unspecified malignant neoplasm of intra-abdominal lymph nodes: Secondary | ICD-10-CM | POA: Diagnosis present

## 2017-12-13 DIAGNOSIS — Z9049 Acquired absence of other specified parts of digestive tract: Secondary | ICD-10-CM | POA: Diagnosis not present

## 2017-12-13 DIAGNOSIS — D638 Anemia in other chronic diseases classified elsewhere: Secondary | ICD-10-CM | POA: Diagnosis present

## 2017-12-13 DIAGNOSIS — I10 Essential (primary) hypertension: Secondary | ICD-10-CM | POA: Diagnosis not present

## 2017-12-13 DIAGNOSIS — R630 Anorexia: Secondary | ICD-10-CM | POA: Diagnosis not present

## 2017-12-13 DIAGNOSIS — C7989 Secondary malignant neoplasm of other specified sites: Secondary | ICD-10-CM | POA: Diagnosis present

## 2017-12-13 DIAGNOSIS — Z0189 Encounter for other specified special examinations: Secondary | ICD-10-CM | POA: Diagnosis not present

## 2017-12-13 DIAGNOSIS — Z515 Encounter for palliative care: Secondary | ICD-10-CM | POA: Diagnosis not present

## 2017-12-13 DIAGNOSIS — K5651 Intestinal adhesions [bands], with partial obstruction: Secondary | ICD-10-CM | POA: Diagnosis not present

## 2017-12-13 DIAGNOSIS — M1280 Other specific arthropathies, not elsewhere classified, unspecified site: Secondary | ICD-10-CM | POA: Diagnosis not present

## 2017-12-13 DIAGNOSIS — G629 Polyneuropathy, unspecified: Secondary | ICD-10-CM | POA: Diagnosis not present

## 2017-12-13 DIAGNOSIS — F411 Generalized anxiety disorder: Secondary | ICD-10-CM | POA: Diagnosis not present

## 2017-12-13 DIAGNOSIS — M6281 Muscle weakness (generalized): Secondary | ICD-10-CM | POA: Diagnosis not present

## 2017-12-13 DIAGNOSIS — R5382 Chronic fatigue, unspecified: Secondary | ICD-10-CM | POA: Diagnosis not present

## 2017-12-13 DIAGNOSIS — H409 Unspecified glaucoma: Secondary | ICD-10-CM | POA: Diagnosis not present

## 2017-12-13 DIAGNOSIS — Z681 Body mass index (BMI) 19 or less, adult: Secondary | ICD-10-CM | POA: Diagnosis not present

## 2017-12-13 DIAGNOSIS — R410 Disorientation, unspecified: Secondary | ICD-10-CM | POA: Diagnosis not present

## 2017-12-13 DIAGNOSIS — R0602 Shortness of breath: Secondary | ICD-10-CM | POA: Diagnosis not present

## 2017-12-13 DIAGNOSIS — I82411 Acute embolism and thrombosis of right femoral vein: Secondary | ICD-10-CM | POA: Diagnosis not present

## 2017-12-13 DIAGNOSIS — E870 Hyperosmolality and hypernatremia: Secondary | ICD-10-CM | POA: Diagnosis not present

## 2017-12-13 DIAGNOSIS — K769 Liver disease, unspecified: Secondary | ICD-10-CM | POA: Diagnosis not present

## 2017-12-13 DIAGNOSIS — E1140 Type 2 diabetes mellitus with diabetic neuropathy, unspecified: Secondary | ICD-10-CM | POA: Diagnosis present

## 2017-12-13 DIAGNOSIS — F339 Major depressive disorder, recurrent, unspecified: Secondary | ICD-10-CM | POA: Diagnosis not present

## 2017-12-13 DIAGNOSIS — F05 Delirium due to known physiological condition: Secondary | ICD-10-CM | POA: Diagnosis not present

## 2017-12-13 DIAGNOSIS — E785 Hyperlipidemia, unspecified: Secondary | ICD-10-CM | POA: Diagnosis not present

## 2017-12-13 DIAGNOSIS — E7849 Other hyperlipidemia: Secondary | ICD-10-CM | POA: Diagnosis not present

## 2017-12-13 DIAGNOSIS — D62 Acute posthemorrhagic anemia: Secondary | ICD-10-CM | POA: Diagnosis not present

## 2017-12-13 DIAGNOSIS — R634 Abnormal weight loss: Secondary | ICD-10-CM | POA: Diagnosis not present

## 2017-12-13 DIAGNOSIS — K921 Melena: Secondary | ICD-10-CM | POA: Diagnosis not present

## 2017-12-13 DIAGNOSIS — E119 Type 2 diabetes mellitus without complications: Secondary | ICD-10-CM | POA: Diagnosis not present

## 2017-12-13 DIAGNOSIS — C7A8 Other malignant neuroendocrine tumors: Secondary | ICD-10-CM | POA: Diagnosis not present

## 2017-12-13 DIAGNOSIS — B370 Candidal stomatitis: Secondary | ICD-10-CM | POA: Diagnosis not present

## 2017-12-13 DIAGNOSIS — E1143 Type 2 diabetes mellitus with diabetic autonomic (poly)neuropathy: Secondary | ICD-10-CM | POA: Diagnosis not present

## 2017-12-13 DIAGNOSIS — R1110 Vomiting, unspecified: Secondary | ICD-10-CM | POA: Diagnosis not present

## 2017-12-13 DIAGNOSIS — E11649 Type 2 diabetes mellitus with hypoglycemia without coma: Secondary | ICD-10-CM | POA: Diagnosis not present

## 2017-12-13 DIAGNOSIS — R1310 Dysphagia, unspecified: Secondary | ICD-10-CM | POA: Diagnosis not present

## 2017-12-13 DIAGNOSIS — M810 Age-related osteoporosis without current pathological fracture: Secondary | ICD-10-CM | POA: Diagnosis present

## 2017-12-13 DIAGNOSIS — E43 Unspecified severe protein-calorie malnutrition: Secondary | ICD-10-CM | POA: Diagnosis not present

## 2017-12-13 DIAGNOSIS — N179 Acute kidney failure, unspecified: Secondary | ICD-10-CM | POA: Diagnosis not present

## 2017-12-13 DIAGNOSIS — K651 Peritoneal abscess: Secondary | ICD-10-CM | POA: Diagnosis not present

## 2017-12-13 DIAGNOSIS — R000 Tachycardia, unspecified: Secondary | ICD-10-CM | POA: Diagnosis present

## 2017-12-13 LAB — GLUCOSE, CAPILLARY
GLUCOSE-CAPILLARY: 83 mg/dL (ref 65–99)
Glucose-Capillary: 136 mg/dL — ABNORMAL HIGH (ref 65–99)
Glucose-Capillary: 117 mg/dL — ABNORMAL HIGH (ref 65–99)

## 2017-12-13 MED ORDER — ACETAMINOPHEN 325 MG PO TABS: 650.0000 mg | ORAL_TABLET | Freq: Four times a day (QID) | ORAL | Status: DC | PRN

## 2017-12-13 MED ORDER — BISACODYL 10 MG RE SUPP
10.0000 mg | Freq: Once | RECTAL | Status: AC
  Administered 2017-12-13: 10 mg via RECTAL
  Filled 2017-12-13: qty 1

## 2017-12-13 MED ORDER — OXYCODONE HCL 5 MG/5ML PO SOLN: 5.0000 mg | Freq: Four times a day (QID) | ORAL | 0 refills | Status: DC

## 2017-12-13 MED ORDER — POLYETHYLENE GLYCOL 3350 17 G PO PACK: 17.0000 g | PACK | Freq: Every day | ORAL | 0 refills | Status: DC | PRN

## 2017-12-13 MED ORDER — DOCUSATE SODIUM 100 MG PO CAPS: 100.0000 mg | ORAL_CAPSULE | Freq: Two times a day (BID) | ORAL | 0 refills | Status: DC

## 2017-12-13 MED ORDER — ENSURE ENLIVE PO LIQD: 237.0000 mL | Freq: Three times a day (TID) | ORAL | 12 refills | Status: DC

## 2017-12-13 NOTE — Progress Notes (Signed)
Physical Therapy Treatment Patient Details Name: Theresa Howe MRN: [id number] DOB: 1931-07-20 Today's Date: 12/13/2017    History of Present Illness Pt is an 83 y/o female who presents with an abdominal mass and 25-30 pound weight loss. CT revealed a mesenteric mass and small bowel mass, dilated pancreatic duct, and mass in the liver. The patient is now s/p small bowel resection on 12/04/17.    PT Comments    Pt progressing towards physical therapy goals. Was able to perform transfers and ambulation with +2 mod assist for balance support and safety. Pt continues to be confused and had difficulty following commands at times. Multimodal cues provided throughout session for all aspects of mobility. Pt anticipates d/c to SNF today. Will continue to follow until d/c.    Follow Up Recommendations  SNF;Supervision/Assistance - 24 hour     Equipment Recommendations  Rolling walker with 5" wheels;3in1 (PT)    Recommendations for Other Services       Precautions / Restrictions Precautions Precautions: Fall Precaution Comments: Abdominial incision Restrictions Weight Bearing Restrictions: No    Mobility  Bed Mobility Overal bed mobility: Needs Assistance Bed Mobility: Supine to Sit     Supine to sit: Min assist     General bed mobility comments: Pt required hand over hand assist for use of rails and step-by-step cueing to complete transfer to EOB.   Transfers Overall transfer level: Needs assistance Equipment used: Rolling walker (2 wheeled);2 person hand held assist Transfers: Sit to/from [employer] Sit to Stand: Mod assist Stand pivot transfers: Mod assist;+2 safety/equipment;From elevated surface       General transfer comment: VC's for hand placement on seated surface for safety. SPT to [facility] with +2 assist no AD, and sit>stand to walker to ambulate to chair with +1.   Ambulation/Gait Ambulation/Gait assistance: Mod assist Ambulation Distance (Feet):  10 Feet Assistive device: Rolling walker (2 wheeled) Gait Pattern/deviations: Step-to pattern;Decreased stride length;Shuffle;Trunk flexed;Narrow base of support Gait velocity: Decreased Gait velocity interpretation: Below normal speed for age/gender General Gait Details: Pt required multimodal cues to ambulate bed to chair. Assist provided for balance, advancing walker, and walker position during turns.    Stairs            Wheelchair Mobility    Modified Rankin (Stroke Patients Only)       Balance Overall balance assessment: Needs assistance Sitting-balance support: Feet supported;No upper extremity supported Sitting balance-Leahy Scale: Poor Sitting balance - Comments: poor initially improving to fair by end of session Postural control: Posterior lean Standing balance support: Bilateral upper extremity supported;During functional activity Standing balance-Leahy Scale: Poor Standing balance comment: reliant on UE support during functional mobility.                            Cognition Arousal/Alertness: Awake/alert Behavior During Therapy: WFL for tasks assessed/performed Overall Cognitive Status: Impaired/Different from baseline Area of Impairment: Following commands;Safety/judgement;Awareness;Problem solving                       Following Commands: Follows one step commands inconsistently;Follows one step commands with increased time Safety/Judgement: Decreased awareness of safety;Decreased awareness of deficits Awareness: Intellectual Problem Solving: Slow processing;Decreased initiation;Difficulty sequencing;Requires verbal cues General Comments: Continues to be appear confused however attention is improved from prior session.       Exercises      General Comments        Pertinent Vitals/Pain Pain Assessment:  Faces Faces Pain Scale: Hurts a little bit Pain Location: Abdomen Pain Descriptors / Indicators: Operative site  guarding;Grimacing Pain Intervention(s): Limited activity within patient's tolerance;Monitored during session;Repositioned    Home Living                      Prior Function            PT Goals (current goals can now be found in the care plan section) Acute Rehab PT Goals Patient Stated Goal: Get better, go to niece's house PT Goal Formulation: With patient/family Time For Goal Achievement: 12/22/17 Potential to Achieve Goals: Good Progress towards PT goals: Progressing toward goals    Frequency    Min 2X/week      PT Plan Current plan remains appropriate    Co-evaluation              AM-PAC PT "6 Clicks" Daily Activity  Outcome Measure  Difficulty turning over in bed (including adjusting bedclothes, sheets and blankets)?: Unable Difficulty moving from lying on back to sitting on the side of the bed? : Unable Difficulty sitting down on and standing up from a chair with arms (e.g., wheelchair, bedside commode, etc,.)?: Unable Help needed moving to and from a bed to chair (including a wheelchair)?: A Little Help needed walking in hospital room?: A Little Help needed climbing 3-5 steps with a railing? : Total 6 Click Score: 10    End of Session Equipment Utilized During Treatment: Gait belt Activity Tolerance: Patient tolerated treatment well Patient left: in bed;with call bell/phone within reach;with bed alarm set Nurse Communication: Mobility status PT Visit Diagnosis: Unsteadiness on feet (R26.81);Difficulty in walking, not elsewhere classified (R26.2);Pain Pain - part of body: (abdomen)     Time: 3220-2542 PT Time Calculation (min) (ACUTE ONLY): 25 min  Charges:  $Gait Training: 23-37 mins                    G Codes:       Conni Slipper, PT, DPT Acute Rehabilitation Services Pager: [phone]    Marylynn Pearson 12/13/2017, 12:48 PM

## 2017-12-13 NOTE — Progress Notes (Signed)
[facility] Progress Note  9 Days Post-Op  Subjective: CC: No new complaints No issues overnight.   Objective: Vital signs in last 24 hours: Temp:  [97.8 F (36.6 C)-98.2 F (36.8 C)] 97.8 F (36.6 C) (12/27 0504) Pulse Rate:  [91-92] 92 (12/27 0554) Resp:  [16-18] 16 (12/27 0504) BP: (88-128)/(56-62) 101/60 (12/27 0554) SpO2:  [98 %-100 %] 100 % (12/27 0504) Last BM Date: 12/08/17  Intake/Output from previous day: 12/26 0701 - 12/27 0700 In: 2160 [P.O.:360; I.V.:1800] Out: 100 [Urine:100] Intake/Output this shift: No intake/output data recorded.  PE: Gen:  Alert, NAD, pleasant Card:  Regular rate and rhythm, pedal pulses 2+ BL Pulm:  Normal effort, clear to auscultation bilaterally Abd: Soft, non-tender, non-distended, bowel sounds present in all 4 quadrants, no HSM, incisions C/D/I Skin: warm and dry, no rashes  Psych: A&Ox3  GU: Wick in place  Lab Results:  No results for input(s): WBC, HGB, HCT, PLT in the last 72 hours. BMET Recent Labs    12/11/17 0756  CREATININE 0.64   PT/INR No results for input(s): LABPROT, INR in the last 72 hours. CMP     Component Value Date/Time   NA 130 (L) 1Aug 10, 202018 0655   K 4.7 1Aug 10, 202018 0655   CL 106 1Aug 10, 202018 0655   CO2 18 (L) 1Aug 10, 202018 0655   GLUCOSE 242 (H) 1Aug 10, 202018 0655   BUN 7 1Aug 10, 202018 0655   CREATININE 0.64 12/11/2017 0756   CREATININE 0.97 (H) 10/05/2017 1400   CALCIUM 7.9 (L) 1Aug 10, 202018 0655   PROT 6.7 11/28/2017 1220   ALBUMIN 3.4 (L) 11/28/2017 1220   AST 30 11/28/2017 1220   ALT 18 11/28/2017 1220   ALKPHOS 61 11/28/2017 1220   BILITOT 0.8 11/28/2017 1220   GFRNONAA >60 12/11/2017 0756   GFRNONAA 54 (L) 10/05/2017 1400   GFRAA >60 12/11/2017 0756   GFRAA 62 10/05/2017 1400   Lipase     Component Value Date/Time   LIPASE 54 10/05/2017 1400       Studies/Results: No results found.  Anti-infectives: Anti-infectives (From admission, onward)   Start     Dose/Rate Route  Frequency Ordered Stop   12/04/17 1600  ceFAZolin (ANCEF) IVPB 2g/100 mL premix     2 g 200 mL/hr over 30 Minutes Intravenous Every 8 hours 12/04/17 1344 12/04/17 1746   12/04/17 0643  ceFAZolin (ANCEF) IVPB 2g/100 mL premix     2 g 200 mL/hr over 30 Minutes Intravenous On call to O.R. 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 Dr. Barry Dienes, 12/18 - patient is tolerating a diet and pain is well controlled - oncology working on outpatient follow up - patient is stable for discharge to SNF from a surgical standpoint.   FEN: regular diet VTE: SCD's, lovenox ID: Ancef pre-op only Follow up: Dr. Barry Dienes    LOS: 9 days    Clovis Riley , [facility] 12/13/2017, 8:04 AM

## 2017-12-13 NOTE — Discharge Summary (Signed)
Physician Discharge Summary  Patient ID: Theresa Howe MRN: [id number] DOB/AGE: 81-Sep-1932 81 y.o.  Admit date: 12/04/2017 Discharge date: 12/13/2017  Admission Diagnoses:  Small bowel mass History of anxiety disorder. Arthritis Diabetes mellitus Hypertension Hypercholesterolemia  Discharge Diagnoses:  Neuroendocrine tumor with carcinomatosis. Protein malnutrition. Anemia Postop hyponatremia   Active Problems:   Small bowel mass   Protein-calorie malnutrition, severe   PROCEDURES: Diagnostic laparoscopy with small bowel resection, partial omentectomy, 12/04/17 Dr. Stark Klein.  Hospital Course: 83 year old female who presents with an abdominal mass. Pt is an 83 yo F who presented to her PCP with significant weight loss. The patient attributed it to stress, but based on bowel habit changing and blood in stool, a CT was done. This showed a mesenteric mass and small bowel mass. She had a dilated pancreatic duct as well. An MRi was done to evaluate this. NO pancreatic head mass was seen. There was also seen a mass in the liver. Due to the issues with breath hold on MRI and no contrast on CT, a triple phase CT was done with contrast to help resolve the liver issues.  She actually had an EUS in 2014 to assess the pancreatic ductal dilation. This was negaitve for pancreatic duct lesions or masses.  The patient does have some bloating, but no n/v. She has lost 25-30 pounds. She is still mowing her lawn and 4 of her family members is from notes.  To a skilled nursing facility.  She was seen on 12/13/17 by Dr. Windle Guard and she agrees from a surgical standpoint she is ready for discharge.  She is on a regular diet.  PT  was ordered but she would occasionally declined this.  We will take her staples out and Steri-Stripped the wound before discharge.  Follow-up is arranged for 12/24/17, with Dr. Barry Dienes.  I did participate care of patient, discharge summary is from the notes.  CBC  Latest Ref Rng & Units 12020/06/517 12/09/2017 12/08/2017  WBC 4.0 - 10.5 K/uL 11.7(H) 8.9 8.9  Hemoglobin 12.0 - 15.0 g/dL 8.8(L) 8.1(L) 8.3(L)  Hematocrit 36.0 - 46.0 % 26.0(L) 23.7(L) 24.6(L)  Platelets 150 - 400 K/uL 296 228 213   CMP Latest Ref Rng & Units 12/11/2017 12020/06/517 12/09/2017  Glucose 65 - 99 mg/dL - 242(H) 218(H)  BUN 6 - 20 mg/dL - 7 8  Creatinine 0.44 - 1.00 mg/dL 0.64 0.68 0.75  Sodium 135 - 145 mmol/L - 130(L) 131(L)  Potassium 3.5 - 5.1 mmol/L - 4.7 4.7  Chloride 101 - 111 mmol/L - 106 108  CO2 22 - 32 mmol/L - 18(L) 17(L)  Calcium 8.9 - 10.3 mg/dL - 7.9(L) 7.8(L)  Total Protein 6.5 - 8.1 g/dL - - -  Total Bilirubin 0.3 - 1.2 mg/dL - - -  Alkaline Phos 38 - 126 U/L - - -  AST 15 - 41 U/L - - -  ALT 14 - 54 U/L - - -   1. Small intestine, resection for tumor - LOW GRADE NEUROENDOCRINE TUMOR, 3.8 CM, EXTENDING TO SMALL BOWEL SEROSA. - PROXIMAL AND DISTAL MARGINS FREE OF TUMOR. - FOUR CM MESENTERIC NODULE CONSISTENT WITH LYMPH NODE METASTASIS. - METASTATIC LOW GRADE NEUROENDOCRINE TUMOR IN THREE OF ELEVEN LYMPH NODES (3/11). 2. Omentum, biopsy - NODULES OF LOW GRADE NEUROENDOCRINE TUMOR INVOLVING OMENTAL ADIPOSE TISSUE.  TNM Staging: pT4, pN2   Condition on DC:  Improving  Disposition: Skilled nursing home   Allergies as of 12/13/2017   No Known Allergies  Physician Discharge Summary  Patient ID: Theresa Howe MRN: [id number] DOB/AGE: 03-05-1931 83 y.o.  Admit date: 12/04/2017 Discharge date: 12/13/2017  Admission Diagnoses:  Small bowel mass History of anxiety disorder. Arthritis Diabetes mellitus Hypertension Hypercholesterolemia  Discharge Diagnoses:  Neuroendocrine tumor with carcinomatosis. Protein malnutrition. Anemia Postop hyponatremia   Active Problems:   Small bowel mass   Protein-calorie malnutrition, severe   PROCEDURES: Diagnostic laparoscopy with small bowel resection, partial omentectomy, 12/04/17 Dr. Stark Klein.  Hospital Course: 83 year old female who presents with an abdominal mass. Pt is an 83 yo F who presented to her PCP with significant weight loss. The patient attributed it to stress, but based on bowel habit changing and blood in stool, a CT was done. This showed a mesenteric mass and small bowel mass. She had a dilated pancreatic duct as well. An MRi was done to evaluate this. NO pancreatic head mass was seen. There was also seen a mass in the liver. Due to the issues with breath hold on MRI and no contrast on CT, a triple phase CT was done with contrast to help resolve the liver issues.  She actually had an EUS in 2014 to assess the pancreatic ductal dilation. This was negaitve for pancreatic duct lesions or masses.  The patient does have some bloating, but no n/v. She has lost 25-30 pounds. She is still mowing her lawn and 4 of her family members is from notes.  To a skilled nursing facility.  She was seen on 12/13/17 by Dr. Windle Guard and she agrees from a surgical standpoint she is ready for discharge.  She is on a regular diet.  PT  was ordered but she would occasionally declined this.  We will take her staples out and Steri-Stripped the wound before discharge.  Follow-up is arranged for 12/24/17, with Dr. Barry Dienes.  I did participate care of patient, discharge summary is from the notes.  CBC  Latest Ref Rng & Units 12020-02-2217 12/09/2017 12/08/2017  WBC 4.0 - 10.5 K/uL 11.7(H) 8.9 8.9  Hemoglobin 12.0 - 15.0 g/dL 8.8(L) 8.1(L) 8.3(L)  Hematocrit 36.0 - 46.0 % 26.0(L) 23.7(L) 24.6(L)  Platelets 150 - 400 K/uL 296 228 213   CMP Latest Ref Rng & Units 12/11/2017 12020-02-2217 12/09/2017  Glucose 65 - 99 mg/dL - 242(H) 218(H)  BUN 6 - 20 mg/dL - 7 8  Creatinine 0.44 - 1.00 mg/dL 0.64 0.68 0.75  Sodium 135 - 145 mmol/L - 130(L) 131(L)  Potassium 3.5 - 5.1 mmol/L - 4.7 4.7  Chloride 101 - 111 mmol/L - 106 108  CO2 22 - 32 mmol/L - 18(L) 17(L)  Calcium 8.9 - 10.3 mg/dL - 7.9(L) 7.8(L)  Total Protein 6.5 - 8.1 g/dL - - -  Total Bilirubin 0.3 - 1.2 mg/dL - - -  Alkaline Phos 38 - 126 U/L - - -  AST 15 - 41 U/L - - -  ALT 14 - 54 U/L - - -   1. Small intestine, resection for tumor - LOW GRADE NEUROENDOCRINE TUMOR, 3.8 CM, EXTENDING TO SMALL BOWEL SEROSA. - PROXIMAL AND DISTAL MARGINS FREE OF TUMOR. - FOUR CM MESENTERIC NODULE CONSISTENT WITH LYMPH NODE METASTASIS. - METASTATIC LOW GRADE NEUROENDOCRINE TUMOR IN THREE OF ELEVEN LYMPH NODES (3/11). 2. Omentum, biopsy - NODULES OF LOW GRADE NEUROENDOCRINE TUMOR INVOLVING OMENTAL ADIPOSE TISSUE.  TNM Staging: pT4, pN2   Condition on DC:  Improving  Disposition: Skilled nursing home   Allergies as of 12/13/2017   No Known Allergies  Physician Discharge Summary  Patient ID: Theresa Howe MRN: [id number] DOB/AGE: 03-05-1931 83 y.o.  Admit date: 12/04/2017 Discharge date: 12/13/2017  Admission Diagnoses:  Small bowel mass History of anxiety disorder. Arthritis Diabetes mellitus Hypertension Hypercholesterolemia  Discharge Diagnoses:  Neuroendocrine tumor with carcinomatosis. Protein malnutrition. Anemia Postop hyponatremia   Active Problems:   Small bowel mass   Protein-calorie malnutrition, severe   PROCEDURES: Diagnostic laparoscopy with small bowel resection, partial omentectomy, 12/04/17 Dr. Stark Klein.  Hospital Course: 83 year old female who presents with an abdominal mass. Pt is an 83 yo F who presented to her PCP with significant weight loss. The patient attributed it to stress, but based on bowel habit changing and blood in stool, a CT was done. This showed a mesenteric mass and small bowel mass. She had a dilated pancreatic duct as well. An MRi was done to evaluate this. NO pancreatic head mass was seen. There was also seen a mass in the liver. Due to the issues with breath hold on MRI and no contrast on CT, a triple phase CT was done with contrast to help resolve the liver issues.  She actually had an EUS in 2014 to assess the pancreatic ductal dilation. This was negaitve for pancreatic duct lesions or masses.  The patient does have some bloating, but no n/v. She has lost 25-30 pounds. She is still mowing her lawn and 4 of her family members is from notes.  To a skilled nursing facility.  She was seen on 12/13/17 by Dr. Windle Guard and she agrees from a surgical standpoint she is ready for discharge.  She is on a regular diet.  PT  was ordered but she would occasionally declined this.  We will take her staples out and Steri-Stripped the wound before discharge.  Follow-up is arranged for 12/24/17, with Dr. Barry Dienes.  I did participate care of patient, discharge summary is from the notes.  CBC  Latest Ref Rng & Units 12020-02-2217 12/09/2017 12/08/2017  WBC 4.0 - 10.5 K/uL 11.7(H) 8.9 8.9  Hemoglobin 12.0 - 15.0 g/dL 8.8(L) 8.1(L) 8.3(L)  Hematocrit 36.0 - 46.0 % 26.0(L) 23.7(L) 24.6(L)  Platelets 150 - 400 K/uL 296 228 213   CMP Latest Ref Rng & Units 12/11/2017 12020-02-2217 12/09/2017  Glucose 65 - 99 mg/dL - 242(H) 218(H)  BUN 6 - 20 mg/dL - 7 8  Creatinine 0.44 - 1.00 mg/dL 0.64 0.68 0.75  Sodium 135 - 145 mmol/L - 130(L) 131(L)  Potassium 3.5 - 5.1 mmol/L - 4.7 4.7  Chloride 101 - 111 mmol/L - 106 108  CO2 22 - 32 mmol/L - 18(L) 17(L)  Calcium 8.9 - 10.3 mg/dL - 7.9(L) 7.8(L)  Total Protein 6.5 - 8.1 g/dL - - -  Total Bilirubin 0.3 - 1.2 mg/dL - - -  Alkaline Phos 38 - 126 U/L - - -  AST 15 - 41 U/L - - -  ALT 14 - 54 U/L - - -   1. Small intestine, resection for tumor - LOW GRADE NEUROENDOCRINE TUMOR, 3.8 CM, EXTENDING TO SMALL BOWEL SEROSA. - PROXIMAL AND DISTAL MARGINS FREE OF TUMOR. - FOUR CM MESENTERIC NODULE CONSISTENT WITH LYMPH NODE METASTASIS. - METASTATIC LOW GRADE NEUROENDOCRINE TUMOR IN THREE OF ELEVEN LYMPH NODES (3/11). 2. Omentum, biopsy - NODULES OF LOW GRADE NEUROENDOCRINE TUMOR INVOLVING OMENTAL ADIPOSE TISSUE.  TNM Staging: pT4, pN2   Condition on DC:  Improving  Disposition: Skilled nursing home   Allergies as of 12/13/2017   No Known Allergies

## 2017-12-13 NOTE — Social Work (Addendum)
Clinical Social Worker facilitated patient discharge including contacting patient family and facility to confirm patient discharge plans.  Clinical information faxed to facility and family agreeable with plan.  CSW arranged ambulance transport via [facility] to [facility] Rm 102. RN to call [phone] with report  prior to discharge.  Clinical Social Worker will sign off for now as social work intervention is no longer needed. Please consult [place] again if new need arises.  Alexander Mt, [place] Social Worker

## 2017-12-13 NOTE — Progress Notes (Signed)
9 Days Post-Op    CC: small bowel mass  Subjective: Called nurse says pt has not had a BM for 5 days, some nausea and vomiting this AM Afebrile, VSS No labs since 12/24 Pt looks fine says she is cold and had BM today.  I have ordered a dulcolax and she is already on Miralax. Objective: Vital signs in last 24 hours: Temp:  [97.8 F (36.6 C)-98.2 F (36.8 C)] 97.8 F (36.6 C) (12/27 0504) Pulse Rate:  [91-92] 92 (12/27 0554) Resp:  [16-18] 16 (12/27 0504) BP: (88-128)/(56-62) 101/60 (12/27 0554) SpO2:  [98 %-100 %] 100 % (12/27 0504) Last BM Date: 12/08/17  Intake/Output from previous day: 12/26 0701 - 12/27 0700 In: 2160 [P.O.:360; I.V.:1800] Out: 100 [Urine:100] Intake/Output this shift: No intake/output data recorded.  General appearance: alert, cooperative and no distress GI: soft, non-tender; bowel sounds normal; no masses,  no organomegaly and staples and midline incision looks fine.  Lab Results:  No results for input(s): WBC, HGB, HCT, PLT in the last 72 hours.  BMET Recent Labs    12/11/17 0756  CREATININE 0.64   PT/INR No results for input(s): LABPROT, INR in the last 72 hours.  No results for input(s): AST, ALT, ALKPHOS, BILITOT, PROT, ALBUMIN in the last 168 hours.   Lipase     Component Value Date/Time   LIPASE 54 10/05/2017 1400     Medications: . docusate sodium  100 mg Oral BID  . dorzolamide  1 drop Both Eyes BID  . enoxaparin (LOVENOX) injection  40 mg Subcutaneous Q24H  . escitalopram  10 mg Oral QHS  . feeding supplement (ENSURE ENLIVE)  237 mL Oral TID BM  . fluticasone  2 spray Each Nare Daily  . gabapentin  200 mg Oral BID  . insulin aspart  0-9 Units Subcutaneous TID WC  . insulin glargine  6 Units Subcutaneous Daily  . latanoprost  1 drop Both Eyes QHS  . mirtazapine  7.5 mg Oral QHS  . oxyCODONE  5 mg Oral Q6H  . polyethylene glycol  17 g Oral Daily    Assessment/Plan Neuroendocrine tumor with carcinomatosis. S/p Diagnostic  laparoscopy with small bowel resection, partial omentectomy, 12/04/17 Dr. Stark Klein.   Protein malnutrition. Anemia Postop hyponatremia Protein-calorie malnutrition, severe  FEN: Regular diet ID:  None DVT:  Lovenox   Plan::  Added a dulcolax supp for now, follow up in SNF.             LOS: 9 days    Stclair Szymborski 12/13/2017 [phone]

## 2017-12-13 NOTE — Clinical Social Work Placement (Signed)
CLINICAL SOCIAL WORK PLACEMENT  NOTE [facility]  Date:  12/13/2017  Patient Details  Name: Theresa Howe MRN: [id number] Date of Birth: [birth year]  Clinical Social Work is seeking post-discharge placement for this patient at the Skilled  Nursing Facility level of care (*CSW will initial, date and re-position this form in  chart as items are completed):  Yes   Patient/family provided with [facility] Clinical Social Work Department's list of facilities offering this level of care within the geographic area requested by the patient (or if unable, by the patient's family).  Yes   Patient/family informed of their freedom to choose among providers that offer the needed level of care, that participate in Medicare, Medicaid or managed care program needed by the patient, have an available bed and are willing to accept the patient.  Yes   Patient/family informed of [facility]'s ownership interest in [facility] and [facility], as well as of the fact that they are under no obligation to receive care at these facilities.  PASRR submitted to EDS on 12/10/17     PASRR number received on 12/10/17     Existing PASRR number confirmed on       FL2 transmitted to all facilities in geographic area requested by pt/family on 12/10/17     FL2 transmitted to all facilities within larger geographic area on       Patient informed that his/her managed care company has contracts with or will negotiate with certain facilities, including the following:        Yes   Patient/family informed of bed offers received.  Patient chooses bed at [facility]     Physician recommends and patient chooses bed at      Patient to be transferred to [facility] on 12/13/17.  Patient to be transferred to facility by [facility]     Patient family notified on 12/13/17 of transfer.  Name of family member notified:  Jomarie Longs, nephew     PHYSICIAN Please sign FL2, Please prepare priority  discharge summary, including medications     Additional Comment:    _______________________________________________ Doy Hutching, LCSWA 12/13/2017, 3:11 PM

## 2017-12-13 NOTE — Progress Notes (Signed)
Mid abd incision and lap sites staples removed, steri strips applied, incisions are dry and intact.

## 2017-12-13 NOTE — Social Work (Addendum)
CSW left message for pt nephew Theresa Howe to discuss discharge timing to [facility] today.  12:41- Pt nephew Theresa Howe aware and amenable to transport of pt to [facility] at [place] continuing to follow to support discharge.  Alexander Mt, [place] Work [phone]

## 2017-12-13 NOTE — Progress Notes (Addendum)
1850 Discharged pt to [facility] via [place]. Report was given to Neville Route at [place]. All belongings was given to [facility] driver w/ her cell phone and the upper denture inside the black bag.

## 2017-12-17 DIAGNOSIS — R509 Fever, unspecified: Secondary | ICD-10-CM | POA: Diagnosis not present

## 2017-12-17 DIAGNOSIS — R5383 Other fatigue: Secondary | ICD-10-CM | POA: Diagnosis not present

## 2017-12-17 DIAGNOSIS — R110 Nausea: Secondary | ICD-10-CM | POA: Diagnosis not present

## 2017-12-17 DIAGNOSIS — R630 Anorexia: Secondary | ICD-10-CM | POA: Diagnosis not present

## 2017-12-17 DIAGNOSIS — R1110 Vomiting, unspecified: Secondary | ICD-10-CM | POA: Diagnosis not present

## 2017-12-18 DIAGNOSIS — E785 Hyperlipidemia, unspecified: Secondary | ICD-10-CM | POA: Diagnosis not present

## 2017-12-18 DIAGNOSIS — I10 Essential (primary) hypertension: Secondary | ICD-10-CM | POA: Diagnosis not present

## 2017-12-18 DIAGNOSIS — Z9049 Acquired absence of other specified parts of digestive tract: Secondary | ICD-10-CM | POA: Diagnosis not present

## 2017-12-18 DIAGNOSIS — E119 Type 2 diabetes mellitus without complications: Secondary | ICD-10-CM | POA: Diagnosis not present

## 2017-12-19 ENCOUNTER — Telehealth: Payer: Self-pay | Admitting: Nurse Practitioner

## 2017-12-19 DIAGNOSIS — D3A8 Other benign neuroendocrine tumors: Secondary | ICD-10-CM

## 2017-12-19 DIAGNOSIS — R112 Nausea with vomiting, unspecified: Secondary | ICD-10-CM | POA: Diagnosis not present

## 2017-12-19 DIAGNOSIS — R5382 Chronic fatigue, unspecified: Secondary | ICD-10-CM | POA: Diagnosis not present

## 2017-12-19 DIAGNOSIS — R1110 Vomiting, unspecified: Secondary | ICD-10-CM | POA: Diagnosis not present

## 2017-12-19 DIAGNOSIS — R509 Fever, unspecified: Secondary | ICD-10-CM | POA: Diagnosis not present

## 2017-12-19 NOTE — Telephone Encounter (Signed)
Called patients niece Jennet Maduro with appointment d/D/T/LOC/PH# and she asked that I also call [place] to notify them also of appointment so they can arrange transportation.  I spoke with [facility] facility and she will arrange transport for patient.

## 2017-12-22 ENCOUNTER — Other Ambulatory Visit: Payer: Self-pay | Admitting: Family Medicine

## 2017-12-24 ENCOUNTER — Inpatient Hospital Stay (HOSPITAL_COMMUNITY): Payer: Medicare Other

## 2017-12-24 ENCOUNTER — Other Ambulatory Visit: Payer: Self-pay

## 2017-12-24 ENCOUNTER — Inpatient Hospital Stay (HOSPITAL_COMMUNITY)
Admission: AD | Admit: 2017-12-24 | Discharge: 2018-01-25 | DRG: 329 | Disposition: A | Discharge status: Other Acute Inpatient Hospital | Payer: Medicare Other | Source: Ambulatory Visit | Attending: General Surgery | Admitting: General Surgery

## 2017-12-24 ENCOUNTER — Encounter (HOSPITAL_COMMUNITY): Payer: Self-pay | Admitting: General Practice

## 2017-12-24 ENCOUNTER — Other Ambulatory Visit: Payer: Self-pay | Admitting: General Surgery

## 2017-12-24 DIAGNOSIS — K8689 Other specified diseases of pancreas: Secondary | ICD-10-CM | POA: Diagnosis present

## 2017-12-24 DIAGNOSIS — I6782 Cerebral ischemia: Secondary | ICD-10-CM | POA: Diagnosis not present

## 2017-12-24 DIAGNOSIS — E43 Unspecified severe protein-calorie malnutrition: Secondary | ICD-10-CM | POA: Diagnosis present

## 2017-12-24 DIAGNOSIS — R160 Hepatomegaly, not elsewhere classified: Secondary | ICD-10-CM | POA: Diagnosis present

## 2017-12-24 DIAGNOSIS — Z7189 Other specified counseling: Secondary | ICD-10-CM | POA: Diagnosis not present

## 2017-12-24 DIAGNOSIS — C772 Secondary and unspecified malignant neoplasm of intra-abdominal lymph nodes: Secondary | ICD-10-CM | POA: Diagnosis present

## 2017-12-24 DIAGNOSIS — K8020 Calculus of gallbladder without cholecystitis without obstruction: Secondary | ICD-10-CM | POA: Diagnosis not present

## 2017-12-24 DIAGNOSIS — Z794 Long term (current) use of insulin: Secondary | ICD-10-CM

## 2017-12-24 DIAGNOSIS — K651 Peritoneal abscess: Secondary | ICD-10-CM

## 2017-12-24 DIAGNOSIS — J9811 Atelectasis: Secondary | ICD-10-CM | POA: Diagnosis not present

## 2017-12-24 DIAGNOSIS — T183XXA Foreign body in small intestine, initial encounter: Secondary | ICD-10-CM | POA: Diagnosis present

## 2017-12-24 DIAGNOSIS — E11649 Type 2 diabetes mellitus with hypoglycemia without coma: Secondary | ICD-10-CM | POA: Diagnosis not present

## 2017-12-24 DIAGNOSIS — B370 Candidal stomatitis: Secondary | ICD-10-CM

## 2017-12-24 DIAGNOSIS — I152 Hypertension secondary to endocrine disorders: Secondary | ICD-10-CM | POA: Diagnosis present

## 2017-12-24 DIAGNOSIS — R1110 Vomiting, unspecified: Secondary | ICD-10-CM | POA: Diagnosis not present

## 2017-12-24 DIAGNOSIS — C7A1 Malignant poorly differentiated neuroendocrine tumors: Secondary | ICD-10-CM | POA: Diagnosis not present

## 2017-12-24 DIAGNOSIS — E1143 Type 2 diabetes mellitus with diabetic autonomic (poly)neuropathy: Secondary | ICD-10-CM | POA: Diagnosis not present

## 2017-12-24 DIAGNOSIS — Z681 Body mass index (BMI) 19 or less, adult: Secondary | ICD-10-CM

## 2017-12-24 DIAGNOSIS — R188 Other ascites: Secondary | ICD-10-CM | POA: Diagnosis present

## 2017-12-24 DIAGNOSIS — Z79899 Other long term (current) drug therapy: Secondary | ICD-10-CM

## 2017-12-24 DIAGNOSIS — E639 Nutritional deficiency, unspecified: Secondary | ICD-10-CM | POA: Diagnosis not present

## 2017-12-24 DIAGNOSIS — Z515 Encounter for palliative care: Secondary | ICD-10-CM | POA: Diagnosis not present

## 2017-12-24 DIAGNOSIS — D638 Anemia in other chronic diseases classified elsewhere: Secondary | ICD-10-CM | POA: Diagnosis present

## 2017-12-24 DIAGNOSIS — R6251 Failure to thrive (child): Secondary | ICD-10-CM

## 2017-12-24 DIAGNOSIS — Z789 Other specified health status: Secondary | ICD-10-CM | POA: Diagnosis not present

## 2017-12-24 DIAGNOSIS — Z0189 Encounter for other specified special examinations: Secondary | ICD-10-CM

## 2017-12-24 DIAGNOSIS — R000 Tachycardia, unspecified: Secondary | ICD-10-CM | POA: Diagnosis present

## 2017-12-24 DIAGNOSIS — C7989 Secondary malignant neoplasm of other specified sites: Secondary | ICD-10-CM | POA: Diagnosis present

## 2017-12-24 DIAGNOSIS — R1030 Lower abdominal pain, unspecified: Secondary | ICD-10-CM | POA: Diagnosis not present

## 2017-12-24 DIAGNOSIS — R1310 Dysphagia, unspecified: Secondary | ICD-10-CM | POA: Diagnosis not present

## 2017-12-24 DIAGNOSIS — R319 Hematuria, unspecified: Secondary | ICD-10-CM | POA: Diagnosis not present

## 2017-12-24 DIAGNOSIS — K5669 Other partial intestinal obstruction: Secondary | ICD-10-CM | POA: Diagnosis not present

## 2017-12-24 DIAGNOSIS — C7A8 Other malignant neuroendocrine tumors: Secondary | ICD-10-CM | POA: Diagnosis not present

## 2017-12-24 DIAGNOSIS — E1165 Type 2 diabetes mellitus with hyperglycemia: Secondary | ICD-10-CM | POA: Diagnosis not present

## 2017-12-24 DIAGNOSIS — K5651 Intestinal adhesions [bands], with partial obstruction: Secondary | ICD-10-CM | POA: Diagnosis not present

## 2017-12-24 DIAGNOSIS — F05 Delirium due to known physiological condition: Secondary | ICD-10-CM | POA: Diagnosis not present

## 2017-12-24 DIAGNOSIS — L89159 Pressure ulcer of sacral region, unspecified stage: Secondary | ICD-10-CM | POA: Diagnosis present

## 2017-12-24 DIAGNOSIS — R1312 Dysphagia, oropharyngeal phase: Secondary | ICD-10-CM | POA: Diagnosis not present

## 2017-12-24 DIAGNOSIS — C800 Disseminated malignant neoplasm, unspecified: Secondary | ICD-10-CM | POA: Diagnosis not present

## 2017-12-24 DIAGNOSIS — D473 Essential (hemorrhagic) thrombocythemia: Secondary | ICD-10-CM | POA: Diagnosis not present

## 2017-12-24 DIAGNOSIS — I10 Essential (primary) hypertension: Secondary | ICD-10-CM

## 2017-12-24 DIAGNOSIS — K921 Melena: Secondary | ICD-10-CM | POA: Diagnosis not present

## 2017-12-24 DIAGNOSIS — Z7984 Long term (current) use of oral hypoglycemic drugs: Secondary | ICD-10-CM

## 2017-12-24 DIAGNOSIS — R64 Cachexia: Secondary | ICD-10-CM | POA: Diagnosis present

## 2017-12-24 DIAGNOSIS — R627 Adult failure to thrive: Secondary | ICD-10-CM | POA: Diagnosis present

## 2017-12-24 DIAGNOSIS — Z4659 Encounter for fitting and adjustment of other gastrointestinal appliance and device: Secondary | ICD-10-CM | POA: Diagnosis not present

## 2017-12-24 DIAGNOSIS — I82411 Acute embolism and thrombosis of right femoral vein: Secondary | ICD-10-CM | POA: Diagnosis not present

## 2017-12-24 DIAGNOSIS — E119 Type 2 diabetes mellitus without complications: Secondary | ICD-10-CM | POA: Diagnosis not present

## 2017-12-24 DIAGNOSIS — Z452 Encounter for adjustment and management of vascular access device: Secondary | ICD-10-CM | POA: Diagnosis not present

## 2017-12-24 DIAGNOSIS — G629 Polyneuropathy, unspecified: Secondary | ICD-10-CM | POA: Diagnosis not present

## 2017-12-24 DIAGNOSIS — R410 Disorientation, unspecified: Secondary | ICD-10-CM | POA: Diagnosis not present

## 2017-12-24 DIAGNOSIS — R634 Abnormal weight loss: Secondary | ICD-10-CM | POA: Diagnosis not present

## 2017-12-24 DIAGNOSIS — E870 Hyperosmolality and hypernatremia: Secondary | ICD-10-CM | POA: Diagnosis not present

## 2017-12-24 DIAGNOSIS — E860 Dehydration: Secondary | ICD-10-CM | POA: Diagnosis not present

## 2017-12-24 DIAGNOSIS — E1140 Type 2 diabetes mellitus with diabetic neuropathy, unspecified: Secondary | ICD-10-CM | POA: Diagnosis present

## 2017-12-24 DIAGNOSIS — E876 Hypokalemia: Secondary | ICD-10-CM | POA: Diagnosis not present

## 2017-12-24 DIAGNOSIS — N179 Acute kidney failure, unspecified: Secondary | ICD-10-CM | POA: Diagnosis not present

## 2017-12-24 DIAGNOSIS — K56609 Unspecified intestinal obstruction, unspecified as to partial versus complete obstruction: Secondary | ICD-10-CM | POA: Diagnosis not present

## 2017-12-24 DIAGNOSIS — R262 Difficulty in walking, not elsewhere classified: Secondary | ICD-10-CM | POA: Diagnosis not present

## 2017-12-24 DIAGNOSIS — IMO0001 Reserved for inherently not codable concepts without codable children: Secondary | ICD-10-CM | POA: Diagnosis present

## 2017-12-24 DIAGNOSIS — T8143XA Infection following a procedure, organ and space surgical site, initial encounter: Secondary | ICD-10-CM | POA: Diagnosis not present

## 2017-12-24 DIAGNOSIS — Z7951 Long term (current) use of inhaled steroids: Secondary | ICD-10-CM

## 2017-12-24 DIAGNOSIS — M810 Age-related osteoporosis without current pathological fracture: Secondary | ICD-10-CM | POA: Diagnosis present

## 2017-12-24 DIAGNOSIS — T8149XA Infection following a procedure, other surgical site, initial encounter: Secondary | ICD-10-CM | POA: Diagnosis not present

## 2017-12-24 DIAGNOSIS — R451 Restlessness and agitation: Secondary | ICD-10-CM | POA: Diagnosis not present

## 2017-12-24 DIAGNOSIS — H409 Unspecified glaucoma: Secondary | ICD-10-CM | POA: Diagnosis present

## 2017-12-24 DIAGNOSIS — R112 Nausea with vomiting, unspecified: Secondary | ICD-10-CM | POA: Diagnosis not present

## 2017-12-24 DIAGNOSIS — E785 Hyperlipidemia, unspecified: Secondary | ICD-10-CM | POA: Diagnosis present

## 2017-12-24 DIAGNOSIS — K769 Liver disease, unspecified: Secondary | ICD-10-CM | POA: Diagnosis not present

## 2017-12-24 DIAGNOSIS — R718 Other abnormality of red blood cells: Secondary | ICD-10-CM | POA: Diagnosis not present

## 2017-12-24 DIAGNOSIS — D62 Acute posthemorrhagic anemia: Secondary | ICD-10-CM | POA: Diagnosis not present

## 2017-12-24 DIAGNOSIS — Z7982 Long term (current) use of aspirin: Secondary | ICD-10-CM

## 2017-12-24 DIAGNOSIS — Z90710 Acquired absence of both cervix and uterus: Secondary | ICD-10-CM

## 2017-12-24 DIAGNOSIS — C7A098 Malignant carcinoid tumors of other sites: Secondary | ICD-10-CM | POA: Diagnosis not present

## 2017-12-24 DIAGNOSIS — R0602 Shortness of breath: Secondary | ICD-10-CM | POA: Diagnosis not present

## 2017-12-24 DIAGNOSIS — Z4682 Encounter for fitting and adjustment of non-vascular catheter: Secondary | ICD-10-CM | POA: Diagnosis not present

## 2017-12-24 DIAGNOSIS — Z818 Family history of other mental and behavioral disorders: Secondary | ICD-10-CM

## 2017-12-24 DIAGNOSIS — D3A019 Benign carcinoid tumor of the small intestine, unspecified portion: Secondary | ICD-10-CM | POA: Diagnosis not present

## 2017-12-24 DIAGNOSIS — R269 Unspecified abnormalities of gait and mobility: Secondary | ICD-10-CM | POA: Diagnosis present

## 2017-12-24 DIAGNOSIS — G4736 Sleep related hypoventilation in conditions classified elsewhere: Secondary | ICD-10-CM | POA: Diagnosis not present

## 2017-12-24 HISTORY — PX: PICC LINE INSERTION: CATH118290

## 2017-12-24 HISTORY — DX: Intra-abdominal and pelvic swelling, mass and lump, unspecified site: R19.00

## 2017-12-24 HISTORY — DX: Failure to thrive (child): R62.51

## 2017-12-24 LAB — COMPREHENSIVE METABOLIC PANEL
ALT: 12 U/L — ABNORMAL LOW (ref 14–54)
ANION GAP: 15 (ref 5–15)
AST: 16 U/L (ref 15–41)
Albumin: 1.8 g/dL — ABNORMAL LOW (ref 3.5–5.0)
Alkaline Phosphatase: 106 U/L (ref 38–126)
BILIRUBIN TOTAL: 1.4 mg/dL — AB (ref 0.3–1.2)
BUN: 16 mg/dL (ref 6–20)
CO2: 24 mmol/L (ref 22–32)
Calcium: 8.1 mg/dL — ABNORMAL LOW (ref 8.9–10.3)
Chloride: 99 mmol/L — ABNORMAL LOW (ref 101–111)
Creatinine, Ser: 0.74 mg/dL (ref 0.44–1.00)
Glucose, Bld: 180 mg/dL — ABNORMAL HIGH (ref 65–99)
POTASSIUM: 2.9 mmol/L — AB (ref 3.5–5.1)
Sodium: 138 mmol/L (ref 135–145)
TOTAL PROTEIN: 5.5 g/dL — AB (ref 6.5–8.1)

## 2017-12-24 LAB — PROTIME-INR
INR: 1.25
Prothrombin Time: 15.6 seconds — ABNORMAL HIGH (ref 11.4–15.2)

## 2017-12-24 LAB — PHOSPHORUS: PHOSPHORUS: 3.3 mg/dL (ref 2.5–4.6)

## 2017-12-24 LAB — MAGNESIUM: MAGNESIUM: 1.9 mg/dL (ref 1.7–2.4)

## 2017-12-24 MED ORDER — SODIUM CHLORIDE 0.9% FLUSH
10.0000 mL | INTRAVENOUS | Status: DC | PRN
  Administered 2017-12-31 – 2018-01-11 (×5): 10 mL
  Filled 2017-12-24 (×5): qty 40

## 2017-12-24 MED ORDER — ACETAMINOPHEN 650 MG RE SUPP
650.0000 mg | Freq: Four times a day (QID) | RECTAL | Status: DC | PRN
  Administered 2017-12-31: 650 mg via RECTAL
  Filled 2017-12-24: qty 1

## 2017-12-24 MED ORDER — PANTOPRAZOLE SODIUM 40 MG IV SOLR
40.0000 mg | Freq: Every day | INTRAVENOUS | Status: DC
  Administered 2017-12-24 – 2018-01-24 (×32): 40 mg via INTRAVENOUS
  Filled 2017-12-24 (×33): qty 40

## 2017-12-24 MED ORDER — OXYCODONE HCL 5 MG/5ML PO SOLN
5.0000 mg | Freq: Four times a day (QID) | ORAL | Status: DC | PRN
  Administered 2017-12-27 – 2017-12-30 (×2): 5 mg via ORAL
  Filled 2017-12-24 (×3): qty 5

## 2017-12-24 MED ORDER — ACETAMINOPHEN 325 MG PO TABS
650.0000 mg | ORAL_TABLET | Freq: Four times a day (QID) | ORAL | Status: DC | PRN
  Administered 2018-01-05 – 2018-01-21 (×2): 650 mg via ORAL
  Filled 2017-12-24 (×4): qty 2

## 2017-12-24 MED ORDER — ENOXAPARIN SODIUM 30 MG/0.3ML ~~LOC~~ SOLN
30.0000 mg | SUBCUTANEOUS | Status: DC
  Administered 2017-12-24 – 2017-12-25 (×2): 30 mg via SUBCUTANEOUS
  Filled 2017-12-24 (×3): qty 0.3

## 2017-12-24 MED ORDER — INSULIN ASPART 100 UNIT/ML ~~LOC~~ SOLN
0.0000 [IU] | Freq: Four times a day (QID) | SUBCUTANEOUS | Status: DC
  Administered 2017-12-25: 8 [IU] via SUBCUTANEOUS
  Administered 2017-12-25: 3 [IU] via SUBCUTANEOUS
  Administered 2017-12-25 – 2017-12-26 (×2): 8 [IU] via SUBCUTANEOUS
  Administered 2017-12-26: 15 [IU] via SUBCUTANEOUS

## 2017-12-24 MED ORDER — ASPIRIN 81 MG PO CHEW
81.0000 mg | CHEWABLE_TABLET | Freq: Every day | ORAL | Status: DC
  Administered 2017-12-29 – 2018-01-25 (×23): 81 mg via ORAL
  Filled 2017-12-24 (×27): qty 1

## 2017-12-24 MED ORDER — LATANOPROST 0.005 % OP SOLN
1.0000 [drp] | Freq: Every day | OPHTHALMIC | Status: DC
  Administered 2017-12-25 – 2018-01-25 (×32): 1 [drp] via OPHTHALMIC
  Filled 2017-12-24 (×5): qty 2.5

## 2017-12-24 MED ORDER — MIRTAZAPINE 7.5 MG PO TABS
7.5000 mg | ORAL_TABLET | Freq: Every day | ORAL | Status: DC
  Administered 2017-12-25 – 2018-01-21 (×25): 7.5 mg via ORAL
  Filled 2017-12-24 (×30): qty 1

## 2017-12-24 MED ORDER — COLLAGENASE 250 UNIT/GM EX OINT
1.0000 "application " | TOPICAL_OINTMENT | Freq: Every day | CUTANEOUS | Status: DC
  Administered 2017-12-25 – 2018-01-25 (×32): 1 via TOPICAL
  Filled 2017-12-24 (×3): qty 30

## 2017-12-24 MED ORDER — ONDANSETRON HCL 4 MG PO TABS: 4.0000 mg | ORAL_TABLET | Freq: Four times a day (QID) | ORAL | Status: DC | PRN

## 2017-12-24 MED ORDER — DEXTROSE IN LACTATED RINGERS 5 % IV SOLN
INTRAVENOUS | Status: AC
  Administered 2017-12-24 – 2017-12-25 (×3): via INTRAVENOUS

## 2017-12-24 MED ORDER — DIPHENHYDRAMINE HCL 12.5 MG/5ML PO ELIX
12.5000 mg | ORAL_SOLUTION | Freq: Four times a day (QID) | ORAL | Status: DC | PRN
  Administered 2017-12-29: 12.5 mg via ORAL
  Filled 2017-12-24: qty 5

## 2017-12-24 MED ORDER — DIPHENHYDRAMINE HCL 50 MG/ML IJ SOLN
12.5000 mg | Freq: Four times a day (QID) | INTRAMUSCULAR | Status: DC | PRN
  Administered 2017-12-30: 12.5 mg via INTRAVENOUS
  Filled 2017-12-24: qty 1

## 2017-12-24 MED ORDER — ESCITALOPRAM OXALATE 10 MG PO TABS
5.0000 mg | ORAL_TABLET | Freq: Every day | ORAL | Status: DC
  Administered 2017-12-25 – 2018-01-25 (×29): 5 mg via ORAL
  Filled 2017-12-24 (×3): qty 1
  Filled 2017-12-24: qty 0.5
  Filled 2017-12-24 (×8): qty 1
  Filled 2017-12-24: qty 0.5
  Filled 2017-12-24 (×24): qty 1

## 2017-12-24 MED ORDER — GABAPENTIN 100 MG PO CAPS
100.0000 mg | ORAL_CAPSULE | Freq: Every day | ORAL | Status: DC
  Administered 2017-12-24 – 2017-12-30 (×7): 100 mg via ORAL
  Filled 2017-12-24 (×7): qty 1

## 2017-12-24 MED ORDER — IOPAMIDOL (ISOVUE-300) INJECTION 61%
INTRAVENOUS | Status: AC
  Administered 2017-12-24: 100 mL via INTRAVENOUS
  Filled 2017-12-24: qty 100

## 2017-12-24 MED ORDER — MORPHINE SULFATE (PF) 4 MG/ML IV SOLN: 1.0000 mg | INTRAVENOUS | Status: DC | PRN

## 2017-12-24 MED ORDER — PROCHLORPERAZINE EDISYLATE 5 MG/ML IJ SOLN
5.0000 mg | Freq: Four times a day (QID) | INTRAMUSCULAR | Status: DC | PRN
  Administered 2017-12-30: 5 mg via INTRAVENOUS
  Filled 2017-12-24: qty 2

## 2017-12-24 MED ORDER — ONDANSETRON HCL 4 MG/2ML IJ SOLN
4.0000 mg | Freq: Four times a day (QID) | INTRAMUSCULAR | Status: DC | PRN
  Administered 2017-12-25 – 2018-01-14 (×7): 4 mg via INTRAVENOUS
  Filled 2017-12-24 (×7): qty 2

## 2017-12-24 MED ORDER — PANTOPRAZOLE SODIUM 40 MG PO TBEC: 40.0000 mg | DELAYED_RELEASE_TABLET | Freq: Every day | ORAL | Status: DC

## 2017-12-24 MED ORDER — DORZOLAMIDE HCL 2 % OP SOLN
1.0000 [drp] | Freq: Two times a day (BID) | OPHTHALMIC | Status: DC
  Administered 2017-12-25 – 2018-01-25 (×64): 1 [drp] via OPHTHALMIC
  Filled 2017-12-24 (×5): qty 10

## 2017-12-24 MED ORDER — ONDANSETRON 4 MG PO TBDP: 4.0000 mg | ORAL_TABLET | Freq: Four times a day (QID) | ORAL | Status: DC | PRN

## 2017-12-24 MED ORDER — HYDRALAZINE HCL 20 MG/ML IJ SOLN: 10.0000 mg | INTRAMUSCULAR | Status: DC | PRN

## 2017-12-24 MED ORDER — ALBUTEROL SULFATE (2.5 MG/3ML) 0.083% IN NEBU: 3.0000 mL | INHALATION_SOLUTION | Freq: Four times a day (QID) | RESPIRATORY_TRACT | Status: DC | PRN

## 2017-12-24 MED ORDER — PROCHLORPERAZINE MALEATE 10 MG PO TABS
10.0000 mg | ORAL_TABLET | Freq: Four times a day (QID) | ORAL | Status: DC | PRN
  Filled 2017-12-24 (×2): qty 1

## 2017-12-24 MED ORDER — POTASSIUM CHLORIDE 10 MEQ/50ML IV SOLN
10.0000 meq | INTRAVENOUS | Status: AC
  Administered 2017-12-24 – 2017-12-25 (×6): 10 meq via INTRAVENOUS
  Filled 2017-12-24 (×7): qty 50

## 2017-12-24 MED ORDER — IOPAMIDOL (ISOVUE-300) INJECTION 61%: 15.0000 mL | INTRAVENOUS | Status: AC

## 2017-12-24 MED ORDER — BOOST / RESOURCE BREEZE PO LIQD CUSTOM
1.0000 | Freq: Three times a day (TID) | ORAL | Status: DC
  Administered 2017-12-24 – 2017-12-29 (×5): 1 via ORAL

## 2017-12-24 NOTE — Progress Notes (Signed)
Peripherally Inserted Central Catheter/Midline Placement  The IV Nurse has discussed with the patient and/or persons authorized to consent for the patient, the purpose of this procedure and the potential benefits and risks involved with this procedure.  The benefits include less needle sticks, lab draws from the catheter, and the patient may be discharged home with the catheter. Risks include, but not limited to, infection, bleeding, blood clot (thrombus formation), and puncture of an artery; nerve damage and irregular heartbeat and possibility to perform a PICC exchange if needed/ordered by physician.  Alternatives to this procedure were also discussed.  Bard Power PICC patient education guide, fact sheet on infection prevention and patient information card has been provided to patient /or left at bedside.    PICC/Midline Placement Documentation        Theresa Howe 12/24/2017, 3:57 PM

## 2017-12-24 NOTE — Progress Notes (Signed)
Initial Nutrition Assessment  DOCUMENTATION CODES:   Severe malnutrition in context of chronic illness  INTERVENTION:   -TPN management per pharmacy  NUTRITION DIAGNOSIS:   Severe Malnutrition related to chronic illness(stage IV neuroendocrine tumor) as evidenced by energy intake < 75% for > or equal to 1 month, severe fat depletion, severe muscle depletion.  GOAL:   Patient will meet greater than or equal to 90% of their needs  MONITOR:   PO intake, Diet advancement, Labs, Weight trends, Skin, I & O's  REASON FOR ASSESSMENT:   Malnutrition Screening Tool, Consult New TPN/TNA  ASSESSMENT:   Patient is a 84 year old female who is status post laparoscopic assisted small bowel resection in mid December.  She was discharged to a skilled nursing facility.  She was doing better, but now is throwing up almost everything that she eats.  She is able to keep down a small amount of Ensure.  She also continues to have some postop pain.  She denies fevers and chills.  She is having difficulty working with physical therapy due to weakness.  She is also having difficulty taking her medication because of nausea and vomiting.  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   Pt familiar to this RD due to prior hospitalization (s/p Diagnostic laparoscopy and small bowel resection, partial omentectomy on 12/04/17).   Pt very lethargic at time of visit. Hx obtained from pt niece at bedside. She confirms ongoing poor oral intake, since prior to last hospitalization. Pt was consuming about 50% of her usual intake over the past several months. Per niece, intake has declined even more since transfer to SNF; pt taking sips of Ensure and Boost and bites at meals. Pt does not care for food at SNF and family brings in favorite foods from from that pt also does not eat. Intake consists mainly of grapes, bananas, and sips of supplements; she only tolerated clear liquids well during prior  hospitalization.. Niece reports pt has been very lethargic and weak due to not eating.   Reviewed records from [facility]; pt was on a heart healthy, carb modified, pureed diet. She was also receiving Ensure Plus TID and Medpass 2.0 BID. Per niece, pt even had difficulty keeping down medications unless they were administered in applesauce.   UBW around 162#. Noted wt of 127# during last admission, however, question accuracy. Of note, pt appears more frail and more depleted than RD's recollection of last admission.  Discussed with pt niece plan for PICC line and how pt will receive nutrition via TPN.   Per pharmacy notes, plan to start TPN tomorrow (12/25/16).   Labs reviewed: K: 2.9, CBGS: 83-136 (inpatient orders for glycemic control are 0-15 units insulin aspart every 6 hours).   NUTRITION - FOCUSED PHYSICAL EXAM:    Most Recent Value  Orbital Region  Severe depletion  Upper Arm Region  Severe depletion  Thoracic and Lumbar Region  Severe depletion  Buccal Region  Severe depletion  Temple Region  Severe depletion  Clavicle Bone Region  Severe depletion  Clavicle and Acromion Bone Region  Severe depletion  Scapular Bone Region  Severe depletion  Dorsal Hand  Severe depletion  Patellar Region  Severe depletion  Anterior Thigh Region  Severe depletion  Posterior Calf Region  Severe depletion  Edema (RD Assessment)  None  Hair  Reviewed  Eyes  Reviewed  Mouth  Reviewed  Skin  Reviewed  Nails  Reviewed  Diet Order:  Diet clear liquid Room service appropriate? Yes  EDUCATION NEEDS:   Education needs have been addressed  Skin:  Skin Integrity Issues:: Incisions, Stage II Stage II: coccyx, buttocks Incisions: closed abdominal incision  Last BM:  PTA  Height:   Ht Readings from Last 1 Encounters:  12/24/17 5\' 5"  (1.651 m)    Weight:   Wt Readings from Last 1 Encounters:  12/24/17 127 lb (57.6 kg)    Ideal Body Weight:  56.8 kg  BMI:  Body mass  index is 21.13 kg/m.  Estimated Nutritional Needs:   Kcal:  1500-1700  Protein:  75-90 grams  Fluid:  > 1.5 L    Lashia Niese A. Jimmye Norman, RD, LDN, CDE Pager: [phone] After hours Pager: [phone]

## 2017-12-24 NOTE — Progress Notes (Signed)
MEDICATION RELATED CONSULT NOTE - INITIAL   Pharmacy Consult for TPN Indication: Intolerance to enteral feeding  Plan:  No labs on admit and will not have till later today Ordered TPN labs and nursing orders Ordered height and weight Start D5 at 15ml/hr today Plan to initiate TPN tomorrow F/U TPN labs  Eryn Marandola J 12/24/2017,12:40 PM

## 2017-12-24 NOTE — H&P (Signed)
Theresa Howe is an 84 y.o. female.   Chief Complaint: failure to thrive HPI:  Patient is a 84 year old female who is status post laparoscopic assisted small bowel resection in mid December.  She was discharged to a skilled nursing facility.  She was doing better, but now is throwing up almost everything that she eats.  She is able to keep down a small amount of Ensure.  She also continues to have some postop pain.  She denies fevers and chills.  She is having difficulty working with physical therapy due to weakness.  She is also having difficulty taking her medication because of nausea and vomiting.  Past Medical History:  Diagnosis Date  . Diabetes mellitus    type 2  . Dyspnea   . Glaucoma   . Hyperlipidemia   . Hypertension   . Neuropathy   . Osteoporosis   . Wheezing     Past Surgical History:  Procedure Laterality Date  . ABDOMINAL HYSTERECTOMY    . CATARACT EXTRACTION Right 01/2010  . COLON SURGERY    . COLONOSCOPY    . EUS N/A 04/17/2013   Procedure: UPPER ENDOSCOPIC ULTRASOUND (EUS) LINEAR;  Surgeon: Milus Banister, MD;  Location: [facility];  Service: Endoscopy;  Laterality: N/A;  . GLAUCOMA SURGERY  2007  . LAPAROSCOPIC SMALL BOWEL RESECTION  12/04/2017  . LAPAROSCOPIC SMALL BOWEL RESECTION N/A 12/04/2017   Procedure: LAPAROSCOPIC SMALL BOWEL RESECTION;  Surgeon: Stark Klein, MD;  Location: [place];  Service: General;  Laterality: N/A;  . LAPAROSCOPY N/A 12/04/2017   Procedure: LAPAROSCOPY DIAGNOSTIC;  Surgeon: Stark Klein, MD;  Location: [place];  Service: General;  Laterality: N/A;  . MULTIPLE TOOTH EXTRACTIONS      No family history on file. Social History:  reports that  has never smoked. she has never used smokeless tobacco. She reports that she does not drink alcohol or use drugs.  Allergies: No Known Allergies  meds  acetaminophen (TYLENOL) 325 MG tablet    albuterol (PROAIR HFA) 108 (90 Base) MCG/ACT inhaler    alendronate (FOSAMAX) 70 MG tablet     aspirin 81 MG tablet    Blood Glucose Monitoring Suppl (BLOOD GLUCOSE SYSTEM PAK) KIT    Calcium Carbonate-Vitamin D (CALCIUM 600 + D PO)    docusate sodium (COLACE) 100 MG capsule    dorzolamide (TRUSOPT) 2 % ophthalmic solution    escitalopram (LEXAPRO) 10 MG tablet    feeding supplement, ENSURE ENLIVE, (ENSURE ENLIVE) LIQD    fluticasone (FLONASE) 50 MCG/ACT nasal spray    gabapentin (NEURONTIN) 100 MG capsule    latanoprost (XALATAN) 0.005 % ophthalmic solution    losartan-hydrochlorothiazide (HYZAAR) 50-12.5 MG tablet    metFORMIN (GLUCOPHAGE) 1000 MG tablet    mirtazapine (REMERON) 15 MG tablet    Multiple Vitamins-Minerals (CENTRUM SILVER PO)    ONE TOUCH ULTRA TEST test strip    oxyCODONE (ROXICODONE) 5 MG/5ML solution    polyethylene glycol (MIRALAX / GLYCOLAX) packet    PROAIR HFA 108 (90 Base) MCG/ACT inhaler    valsartan-hydrochlorothiazide (DIOVAN-HCT) 160-12.5 MG tablet      No results found for this or any previous visit (from the past 48 hour(s)). No results found.  Review of Systems  HENT: Negative.   Respiratory: Negative.   Cardiovascular: Negative.   Gastrointestinal: Positive for nausea and vomiting.  Genitourinary: Negative.   Musculoskeletal: Positive for back pain.  Skin:       Pressure sore left sacrum  Neurological: Positive for weakness.  Endo/Heme/Allergies: Negative.   Psychiatric/Behavioral: Negative.     There were no vitals taken for this visit. Physical Exam  Constitutional: She appears distressed.  Frail, thin appearing.  HENT:  Head: Normocephalic and atraumatic.  Right Ear: External ear normal.  Left Ear: External ear normal.  Dry mucous membranes  Eyes: Conjunctivae are normal. Pupils are equal, round, and reactive to light. Right eye exhibits no discharge. Left eye exhibits no discharge. No scleral icterus.  Neck: Normal range of motion. Neck supple. No thyromegaly present.  Cardiovascular: Normal rate and regular rhythm.   Respiratory: Effort normal. No respiratory distress.  GI: She exhibits no distension. There is no tenderness. There is no rebound.  Musculoskeletal:  Unable to stand up straight due to weakness  Psychiatric: She has a normal mood and affect. Her behavior is normal. Judgment and thought content normal.  Flat affect.     Assessment/Plan Stage IV neuroendocrine cancer of the small bowel Failure to thrive Severe protein calorie malnutrition Nausea/vomiting.  Plan admit for labs, rehydration, TNA Will need PICC.  Scan to eval anatomy/post op complications.    Stark Klein, MD 12/24/2017, 10:34 AM

## 2017-12-24 NOTE — Progress Notes (Signed)
Pt came from [place]

## 2017-12-24 NOTE — Progress Notes (Signed)
1550 Receive patient on 6N. Pt alert oriented x4. Patient accompanied by niece.

## 2017-12-25 ENCOUNTER — Telehealth: Payer: Self-pay | Admitting: *Deleted

## 2017-12-25 ENCOUNTER — Telehealth: Payer: Self-pay | Admitting: Oncology

## 2017-12-25 LAB — GLUCOSE, CAPILLARY
GLUCOSE-CAPILLARY: 155 mg/dL — AB (ref 65–99)
GLUCOSE-CAPILLARY: 368 mg/dL — AB (ref 65–99)
Glucose-Capillary: 266 mg/dL — ABNORMAL HIGH (ref 65–99)
Glucose-Capillary: 297 mg/dL — ABNORMAL HIGH (ref 65–99)

## 2017-12-25 LAB — COMPREHENSIVE METABOLIC PANEL
ALBUMIN: 1.7 g/dL — AB (ref 3.5–5.0)
ALK PHOS: 88 U/L (ref 38–126)
ALT: 12 U/L — ABNORMAL LOW (ref 14–54)
ANION GAP: 11 (ref 5–15)
AST: 18 U/L (ref 15–41)
BUN: 13 mg/dL (ref 6–20)
CALCIUM: 7.9 mg/dL — AB (ref 8.9–10.3)
CHLORIDE: 101 mmol/L (ref 101–111)
CO2: 26 mmol/L (ref 22–32)
Creatinine, Ser: 0.66 mg/dL (ref 0.44–1.00)
GFR calc Af Amer: >60 mL/min (ref >60)
GFR calc non Af Amer: >60 mL/min (ref >60)
Glucose, Bld: 187 mg/dL — ABNORMAL HIGH (ref 65–99)
Potassium: 3.5 mmol/L (ref 3.5–5.1)
SODIUM: 138 mmol/L (ref 135–145)
Total Bilirubin: 0.7 mg/dL (ref 0.3–1.2)
Total Protein: 4.8 g/dL — ABNORMAL LOW (ref 6.5–8.1)

## 2017-12-25 LAB — DIFFERENTIAL
BASOS ABS: 0 10*3/uL (ref 0.0–0.1)
Basophils Relative: 0 %
EOS ABS: 0 10*3/uL (ref 0.0–0.7)
EOS PCT: 0 %
LYMPHS ABS: 0.9 10*3/uL (ref 0.7–4.0)
Lymphocytes Relative: 8 %
MONO ABS: 1.1 10*3/uL — AB (ref 0.1–1.0)
Monocytes Relative: 10 %
Neutro Abs: 9.4 10*3/uL — ABNORMAL HIGH (ref 1.7–7.7)
Neutrophils Relative %: 82 %

## 2017-12-25 LAB — MRSA PCR SCREENING: MRSA BY PCR: NEGATIVE

## 2017-12-25 LAB — CBC
HCT: 23.1 % — ABNORMAL LOW (ref 36.0–46.0)
HEMOGLOBIN: 7.2 g/dL — AB (ref 12.0–15.0)
MCH: 24.7 pg — ABNORMAL LOW (ref 26.0–34.0)
MCHC: 31.2 g/dL (ref 30.0–36.0)
MCV: 79.4 fL (ref 78.0–100.0)
PLATELETS: 730 10*3/uL — AB (ref 150–400)
RBC: 2.91 MIL/uL — ABNORMAL LOW (ref 3.87–5.11)
RDW: 15.8 % — ABNORMAL HIGH (ref 11.5–15.5)
WBC: 11.4 10*3/uL — ABNORMAL HIGH (ref 4.0–10.5)

## 2017-12-25 LAB — PHOSPHORUS: PHOSPHORUS: 1.5 mg/dL — AB (ref 2.5–4.6)

## 2017-12-25 LAB — TRIGLYCERIDES: Triglycerides: 131 mg/dL (ref <150)

## 2017-12-25 LAB — PREALBUMIN: Prealbumin: 7.6 mg/dL — ABNORMAL LOW (ref 18–38)

## 2017-12-25 LAB — MAGNESIUM: MAGNESIUM: 1.7 mg/dL (ref 1.7–2.4)

## 2017-12-25 MED ORDER — DEXTROSE IN LACTATED RINGERS 5 % IV SOLN
INTRAVENOUS | Status: DC
  Administered 2017-12-26: via INTRAVENOUS

## 2017-12-25 MED ORDER — TRAVASOL 10 % IV SOLN
INTRAVENOUS | Status: AC
  Administered 2017-12-25: 18:00:00 via INTRAVENOUS
  Filled 2017-12-25: qty 396

## 2017-12-25 MED ORDER — MAGNESIUM SULFATE 2 GM/50ML IV SOLN
2.0000 g | Freq: Once | INTRAVENOUS | Status: AC
  Administered 2017-12-25: 2 g via INTRAVENOUS
  Filled 2017-12-25: qty 50

## 2017-12-25 MED ORDER — ALENDRONATE SODIUM 70 MG PO TABS: ORAL_TABLET | ORAL | 2 refills | Status: DC

## 2017-12-25 MED ORDER — MUPIROCIN 2 % EX OINT
1.0000 "application " | TOPICAL_OINTMENT | Freq: Two times a day (BID) | CUTANEOUS | Status: DC
  Filled 2017-12-25: qty 22

## 2017-12-25 MED ORDER — POTASSIUM PHOSPHATES 15 MMOLE/5ML IV SOLN
20.0000 mmol | Freq: Once | INTRAVENOUS | Status: DC
  Filled 2017-12-25: qty 6.67

## 2017-12-25 MED ORDER — CHLORHEXIDINE GLUCONATE CLOTH 2 % EX PADS: 6.0000 | MEDICATED_PAD | Freq: Every day | CUTANEOUS | Status: DC

## 2017-12-25 NOTE — NC FL2 (Signed)
[place] MEDICAID FL2 LEVEL OF CARE SCREENING TOOL     IDENTIFICATION  Patient Name: Theresa Howe Birthdate: 08-Sep-1931 Sex: female Admission Date (Current Location): 12/24/2017  [facility] and [place] Number:  Producer, television/film/video and Address:  [facility], [address] [place], [place] [address]      Provider Number: [id number]  Attending Physician Name and Address:  Almond Lint, MD  Relative Name and Phone Number:  Nicholes Stairs, [phone]    Current Level of Care: Hospital Recommended Level of Care: Skilled Nursing Facility Prior Approval Number:    Date Approved/Denied:   PASRR Number: [id number]  Discharge Plan: SNF    Current Diagnoses: Patient Active Problem List   Diagnosis Date Noted  . Failure to thrive (0-17) 12/24/2017  . Protein-calorie malnutrition, severe 12/06/2017  . Small bowel mass 12/04/2017  . Protein-calorie malnutrition (HCC) 09/11/2017  . Weight loss 09/11/2017  . Diabetic neuropathy (HCC) 09/28/2015  . Hip pain 09/21/2014  . Muscle ache of extremity 06/12/2014  . Anxiety state 10/06/2013  . Bladder prolapse, female, acquired 08/03/2013  . Osteoporosis, unspecified 08/03/2013  . Nonspecific (abnormal) findings on radiological and other examination of biliary tract 04/17/2013  . Diabetes with neurologic complications (HCC) 09/18/2011  . Hypertension associated with diabetes (HCC) 09/18/2011  . Hyperlipidemia LDL goal <70 09/18/2011  . Glaucoma 09/18/2011    Orientation RESPIRATION BLADDER Height & Weight     Self, Place  Normal External catheter Weight: 127 lb (57.6 kg)(12/10/17) Height:  5\' 5"  (165.1 cm)(12/04/17)  BEHAVIORAL SYMPTOMS/MOOD NEUROLOGICAL BOWEL NUTRITION STATUS      Incontinent TNA  AMBULATORY STATUS COMMUNICATION OF NEEDS Skin   Extensive Assist Verbally PU Stage and Appropriate Care, Surgical wounds(abdomen)   PU Stage 2 Dressing: (coccyx & buttocks)                    Personal Care Assistance Level of Assistance  Bathing, Feeding, Dressing Bathing Assistance: Maximum assistance Feeding assistance: Maximum assistance(TPN) Dressing Assistance: Maximum assistance     Functional Limitations Info  Sight, Hearing, Speech Sight Info: Adequate Hearing Info: Adequate Speech Info: Adequate    SPECIAL CARE FACTORS FREQUENCY  PT (By licensed PT), OT (By licensed OT)     PT Frequency: 5x week OT Frequency: 5x week            Contractures Contractures Info: Not present    Additional Factors Info  Code Status, Allergies Code Status Info: Full Code Allergies Info: NKA   Insulin Sliding Scale Info: 3/day       Current Medications (12/25/2017):  This is the current hospital active medication list Current Facility-Administered Medications  Medication Dose Route Frequency Provider Last Rate Last Dose  . acetaminophen (TYLENOL) tablet 650 mg  650 mg Oral Q6H PRN Almond Lint, MD       Or  . acetaminophen (TYLENOL) suppository 650 mg  650 mg Rectal Q6H PRN Almond Lint, MD      . albuterol (PROVENTIL) (2.5 MG/3ML) 0.083% nebulizer solution 3 mL  3 mL Inhalation Q6H PRN Almond Lint, MD      . aspirin chewable tablet 81 mg  81 mg Oral Daily Almond Lint, MD      . Chlorhexidine Gluconate Cloth 2 % PADS 6 each  6 each Topical Q0600 Almond Lint, MD      . collagenase (SANTYL) ointment 1 application  1 application Topical Daily Almond Lint, MD   1 application at 12/25/17 1131  .  dextrose 5 % in lactated ringers infusion   Intravenous Continuous Quenton Fetter, RPH 100 mL/hr at 12/25/17 1234    . dextrose 5 % in lactated ringers infusion   Intravenous Continuous Quenton Fetter, RPH      . diphenhydrAMINE (BENADRYL) 12.5 MG/5ML elixir 12.5 mg  12.5 mg Oral Q6H PRN Almond Lint, MD       Or  . diphenhydrAMINE (BENADRYL) injection 12.5 mg  12.5 mg Intravenous Q6H PRN Almond Lint, MD      . dorzolamide (TRUSOPT) 2 % ophthalmic solution 1  drop  1 drop Both Eyes BID Almond Lint, MD   1 drop at 12/25/17 1131  . enoxaparin (LOVENOX) injection 30 mg  30 mg Subcutaneous Q24H Almond Lint, MD   30 mg at 12/25/17 1153  . escitalopram (LEXAPRO) tablet 5 mg  5 mg Oral QHS Almond Lint, MD   5 mg at 12/25/17 0000  . feeding supplement (BOOST / RESOURCE BREEZE) liquid 1 Container  1 Container Oral TID BM Almond Lint, MD   1 Container at 12/24/17 2144  . gabapentin (NEURONTIN) capsule 100 mg  100 mg Oral QHS Almond Lint, MD   100 mg at 12/24/17 2308  . hydrALAZINE (APRESOLINE) injection 10 mg  10 mg Intravenous Q2H PRN Almond Lint, MD      . insulin aspart (novoLOG) injection 0-15 Units  0-15 Units Subcutaneous Q6H Armandina Stammer, RPH   8 Units at 12/25/17 1217  . latanoprost (XALATAN) 0.005 % ophthalmic solution 1 drop  1 drop Both Eyes QHS Almond Lint, MD   1 drop at 12/25/17 0000  . magnesium sulfate IVPB 2 g 50 mL  2 g Intravenous Once Quenton Fetter, RPH 50 mL/hr at 12/25/17 1155 2 g at 12/25/17 1155  . mirtazapine (REMERON) tablet 7.5 mg  7.5 mg Oral QHS Almond Lint, MD   7.5 mg at 12/25/17 0000  . morphine 4 MG/ML injection 1-2 mg  1-2 mg Intravenous Q4H PRN Almond Lint, MD      . mupirocin ointment (BACTROBAN) 2 % 1 application  1 application Nasal BID Almond Lint, MD      . ondansetron (ZOFRAN-ODT) disintegrating tablet 4 mg  4 mg Oral Q6H PRN Almond Lint, MD       Or  . ondansetron (ZOFRAN) injection 4 mg  4 mg Intravenous Q6H PRN Almond Lint, MD   4 mg at 12/25/17 1142  . oxyCODONE (ROXICODONE) 5 MG/5ML solution 5 mg  5 mg Oral Q6H PRN Almond Lint, MD      . pantoprazole (PROTONIX) injection 40 mg  40 mg Intravenous QHS Almond Lint, MD   40 mg at 12/24/17 2146  . potassium PHOSPHATE 20 mmol in dextrose 5 % 500 mL infusion  20 mmol Intravenous Once Quenton Fetter, RPH      . prochlorperazine (COMPAZINE) tablet 10 mg  10 mg Oral Q6H PRN Almond Lint, MD       Or  . prochlorperazine (COMPAZINE)  injection 5-10 mg  5-10 mg Intravenous Q6H PRN Almond Lint, MD      . sodium chloride flush (NS) 0.9 % injection 10-40 mL  10-40 mL Intracatheter PRN Almond Lint, MD      . TPN ADULT (ION)   Intravenous Continuous TPN Quenton Fetter, [facility]         Discharge Medications: Please see discharge summary for a list of discharge medications.  Relevant Imaging Results:  Relevant Lab Results:   Additional Information  SS# [id number] [place], [address]

## 2017-12-25 NOTE — Progress Notes (Signed)
Nutrition Follow-up  DOCUMENTATION CODES:   Severe malnutrition in context of chronic illness  INTERVENTION:   -TPN management per pharmacy -Continue Boost Breeze po TID, each supplement provides 250 kcal and 9 grams of protein  NUTRITION DIAGNOSIS:   Severe Malnutrition related to chronic illness(stage IV neuroendocrine tumor) as evidenced by energy intake < 75% for > or equal to 1 month, severe fat depletion, severe muscle depletion.  Ongoing  GOAL:   Patient will meet greater than or equal to 90% of their needs  Progressing  MONITOR:   PO intake, Diet advancement, Labs, Weight trends, Skin, I & O's  REASON FOR ASSESSMENT:   Malnutrition Screening Tool, Consult New TPN/TNA  ASSESSMENT:   Patient is a 84 year old female who is status post laparoscopic assisted small bowel resection in mid December.  She was discharged to a skilled nursing facility.  She was doing better, but now is throwing up almost everything that she eats.  She is able to keep down a small amount of Ensure.  She also continues to have some postop pain.  She denies fevers and chills.  She is having difficulty working with physical therapy due to weakness.  She is also having difficulty taking her medication because of nausea and vomiting.  1/7- PICC placed  Pt on a clear liquid diet; no meal completion data currently available. Noted Boost Breeze ordered on 12/24/17; received one dose last night.   Per pharmacy note, TPN at 30 mL/hr at 1800, which provides 40 g of protein, 108 g of dextrose, and 18 g of lipids which provides 706 kCals per day, meeting ~50% of patient needs. Pharmacy reports that TPN may not be increased to goal depending on intake of PO's and supplements.   Labs reviewed: CBGS: 266 (inpatient orders for glycemic control are 0-15 units insulin aspart every 6 hours), Phos: 1.5 (on IV supplementation). Mg and K WDL.  Diet Order:  Diet clear liquid Room service appropriate? Yes TPN ADULT  (ION)  EDUCATION NEEDS:   Education needs have been addressed  Skin:  Skin Integrity Issues:: Incisions, Stage II Stage II: coccyx, buttocks Incisions: closed abdominal incision  Last BM:  12/24/17  Height:   Ht Readings from Last 1 Encounters:  12/24/17 5\' 5"  (1.651 m)    Weight:   Wt Readings from Last 1 Encounters:  12/24/17 127 lb (57.6 kg)    Ideal Body Weight:  56.8 kg  BMI:  Body mass index is 21.13 kg/m.  Estimated Nutritional Needs:   Kcal:  1500-1700  Protein:  75-90 grams  Fluid:  > 1.5 L    Shawna Wearing A. Jimmye Norman, RD, LDN, CDE Pager: [phone] After hours Pager: [phone]

## 2017-12-25 NOTE — Telephone Encounter (Signed)
Left message on voicemail for patient regarding appointment

## 2017-12-25 NOTE — Progress Notes (Signed)
PHARMACY - ADULT TOTAL PARENTERAL NUTRITION CONSULT NOTE   Pharmacy Consult for TPN Indication: Intolerance to enteral feeding  Patient Measurements: Height: 5\' 5"  (165.1 cm)(12/04/17) Weight: 127 lb (57.6 kg)(12/10/17) IBW/kg (Calculated) : 57 TPN AdjBW (KG): 57.6 Body mass index is 21.13 kg/m.  Assessment:  84 year old female with stage IV neuroendocrine small bowel cancer status post laparoscopic assisted small bowel resection in December who was re-admitted from SNF after she began having nausea and vomiting and unable to tolerate enteral feeding.   GI: s/p SBR in December. Intolerance to enteral feeds. Albumin 1.7. Pre-albumin low 7.6. LBM 1/6.  Endo: Hx of DM on Metformin pta. CBGs 180-187. Insulin requirements in the past 24 hours: 8 units Lytes: K 3.5 after 6 runs of KCl given yesterday. Mg down to 1.7. Phos 1.5.  IVF- D5- LR at 100 mL/hr.  Renal: SCr 0.66 (stable), BUN 13  Pulm: RA Cards: BP soft Hepatobil: LFTs wnl Neuro: ID: WBC up 11.4, Afebrile  TPN Access: PICC 12/24/17 TPN start date: 12/25/17 Nutritional Goals (per RD recommendation on 12/24/17): KCal: 1500-1700 Protein: 75-90 grams Fluid: >1.5L  Goal TPN rate is 60 ml/hr (provides 100% needs)  Current Nutrition: Clear liquid diet Boost 1 TIDWC ( 1 can = 240 kcal, 10g protein, 4g Fat, 41g Carbs)- received 1 can yesterday  Plan:  Initiate TPN at 30 mL/hr.  Reduced requirements with Boost on board.  May not require increase to goal.  This TPN provides 40 g of protein, 108 g of dextrose, and 18 g of lipids which provides 706 kCals per day, meeting ~50% of patient needs.  Electrolytes in TPN: standard Add MVI and trace elements to TPN Continue moderate SSI and adjust as needed Reduce D5-LR to 70 ml/hr at 1800 when new TPN is hung. Monitor TPN labs F/U toleration of Boost and clear liquids and ability to titrate off TPN  Potassium Phosphorus 20 mmol x1 today Magnesium 2g IV x1 today  Sloan Leiter,  PharmD, BCPS, BCCCP Clinical Pharmacist Clinical phone 12/25/2017 until 3:30PM- [phone] After hours, please call #[phone] 12/25/2017,7:59 AM

## 2017-12-25 NOTE — Social Work (Signed)
CSW acknowledging consult that pt arrived from SNF and now is requiring TPN.   Pt is a SNF resident at [facility] [place] contacted [facility] in regards to ability to continue TPN at SNF if still needing so at discharge.   CSW received approval that TPN could be continued at SNF if still necessary at discharge.   CSW continuing to follow to support discharge when medically appropriate.  Alexander Mt, [place] Work [phone]

## 2017-12-25 NOTE — Telephone Encounter (Signed)
Received fax requesting refill on Fosamax.   Refill appropriate and filled per protocol.  

## 2017-12-25 NOTE — Progress Notes (Signed)
Subjective/Chief Complaint: Feeling better with IV fluids.  CT showed unusual dilated structure near anastamosis that seems to be bowel loop.     Objective: Vital signs in last 24 hours: Temp:  [98.3 F (36.8 C)-99.3 F (37.4 C)] 99.3 F (37.4 C) (01/08 1437) Pulse Rate:  [97-100] 97 (01/08 1437) Resp:  [16-18] 18 (01/08 1437) BP: (92-106)/(40-62) 92/40 (01/08 1437) SpO2:  [97 %-100 %] 100 % (01/08 1437) Weight:  [57.6 kg (127 lb)] 57.6 kg (127 lb) (01/07 1618) Last BM Date: 12/23/17  Intake/Output from previous day: 01/07 0701 - 01/08 0700 In: 68 [P.O.:60] Out: -  Intake/Output this shift: Total I/O In: 0  Out: 60 [Urine:60]  General appearance: alert, cooperative and no distress Resp: breathing normally GI: soft, non tender, non distended Extremities: extremities normal, atraumatic, no cyanosis or edema  Lab Results:  Recent Labs    12/25/17 0415  WBC 11.4*  HGB 7.2*  HCT 23.1*  PLT 730*   BMET Recent Labs    12/24/17 1301 12/25/17 0415  NA 138 138  K 2.9* 3.5  CL 99* 101  CO2 24 26  GLUCOSE 180* 187*  BUN 16 13  CREATININE 0.74 0.66  CALCIUM 8.1* 7.9*   PT/INR Recent Labs    12/24/17 1301  LABPROT 15.6*  INR 1.25   ABG No results for input(s): PHART, HCO3 in the last 72 hours.  Invalid input(s): PCO2, PO2  Studies/Results: Ct Abdomen Pelvis W Contrast  Result Date: 12/24/2017 CLINICAL DATA:  Nausea and vomiting. History of neuroendocrine tumor with carcinomatosis, status post diagnostic laparoscopy with small bowel resection and partial omentectomy 12/04/2017. EXAM: CT ABDOMEN AND PELVIS WITH CONTRAST TECHNIQUE: Multidetector CT imaging of the abdomen and pelvis was performed using the standard protocol following bolus administration of intravenous contrast. CONTRAST:  167mL ISOVUE-300 IOPAMIDOL (ISOVUE-300) INJECTION 61% COMPARISON:  CT abdomen 12/17/2017, noncontrast abdomen/ pelvis CT 09/25/2017 FINDINGS: Lower chest: Breathing motion  artifact with mild hypoventilatory change at the right greater than left lung base. No pleural effusion or consolidation. No evidence pulmonary mass. Central line with tip at the atrial caval junction. Hepatobiliary: Subcapsular early enhancing lesion on prior CT is not visualized on this portal venous phase study. No new focal lesion. No biliary dilatation. Gallbladder physiologically distended. Pancreas: Pancreatic ductal prominence measuring 5 mm, chronic. Parenchymal atrophy. No peripancreatic inflammation. Spleen: Normal in size without focal abnormality. Adrenals/Urinary Tract: No adrenal nodule. No hydronephrosis or perinephric edema. Homogeneous renal enhancement with symmetric excretion on delayed phase imaging. Urinary bladder is physiologically distended without wall thickening. Stomach/Bowel: Lack of enteric contrast and paucity of intra-abdominal fat limits detailed bowel assessment. No abnormal gastric distension. Physiologic fluid in the stomach. Enteric sutures in the small bowel in the right abdomen. Arising from the enteric sutures is a tubular structure extending from the right upper quadrant of the pelvis containing air, fluid, and fecal material measuring approximately 20 cm cranial caudal, 9 x 11 cm (TR by AP) in the pelvis. Some hyperdensity is noted in the dependent portion. There there is thin peripheral enhancement. Only minimal surrounding inflammation. Apparent bowel connection is adjacent to enteric sutures, for example image 41 series 3 as well as in the right lower quadrant image 44 series 6. More distal small bowel is fluid-filled, mildly dilated measuring up to 4 cm. Small bowel proximally is nondilated. Small bowel distally is fluid-filled and prominent with mild mucosal enhancement. The colon is nondistended. Small volume of colonic stool. Vascular/Lymphatic: Aortic atherosclerosis, mild. No definite mesenteric  adenopathy. Reproductive: Pessary in place.  Post hysterectomy. Other:  No ascites.  No definite free air. Musculoskeletal: Advanced degenerative change in the spine. No acute osseous abnormality. IMPRESSION: 1. Post recent small bowel resection. Arising from the enteric sutures is a tubular structure containing air, fluid, and fecal material within peripheral enhancement. It is unclear whether this represents a dilated loop of small bowel or extraluminal collection secondary to contained anastomotic leak. There are two separate appearing connections to small bowel which favor bowel dilatation. Consider repeat exam with enteric contrast for further evaluation. 2. Early enhancing lesion on prior hepatic protocol CT is not seen, favoring vascular phenomenon. Electronically Signed   By: Jeb Levering M.D.   On: 12/24/2017 23:21    Anti-infectives: Anti-infectives (From admission, onward)   None      Assessment/Plan: S/p lap asst SBR for obstructing neuroendocrine tumor around 3 weeks ago.  CT looks like small bowel proximal to anastamosis has dilated up and is partially obstructed.  Question stricture due to poor blood flow? She does not appear to have a perforation.   Plan takeback to OR on Thursday.  Will likely be around 1 or 1:30 pm.  Plan to look laparoscopically and may need to redo anastamosis.   Start TNA Small volume clear liquids OK, but would not expect her to take much in. Mainly palliative.  Replete phos and K.   LOS: 1 day    Stark Klein 12/25/2017

## 2017-12-26 ENCOUNTER — Ambulatory Visit: Payer: Medicare Other | Admitting: Nurse Practitioner

## 2017-12-26 LAB — GLUCOSE, CAPILLARY
GLUCOSE-CAPILLARY: 256 mg/dL — AB (ref 65–99)
GLUCOSE-CAPILLARY: 127 mg/dL — AB (ref 65–99)
Glucose-Capillary: 285 mg/dL — ABNORMAL HIGH (ref 65–99)
Glucose-Capillary: 251 mg/dL — ABNORMAL HIGH (ref 65–99)

## 2017-12-26 LAB — BASIC METABOLIC PANEL
ANION GAP: 7 (ref 5–15)
BUN: 12 mg/dL (ref 6–20)
CALCIUM: 7.9 mg/dL — AB (ref 8.9–10.3)
CO2: 26 mmol/L (ref 22–32)
CREATININE: 0.57 mg/dL (ref 0.44–1.00)
Chloride: 104 mmol/L (ref 101–111)
GFR calc Af Amer: >60 mL/min (ref >60)
GFR calc non Af Amer: >60 mL/min (ref >60)
GLUCOSE: 231 mg/dL — AB (ref 65–99)
Potassium: 3.2 mmol/L — ABNORMAL LOW (ref 3.5–5.1)
Sodium: 137 mmol/L (ref 135–145)

## 2017-12-26 LAB — PHOSPHORUS: Phosphorus: 1.7 mg/dL — ABNORMAL LOW (ref 2.5–4.6)

## 2017-12-26 LAB — MAGNESIUM: Magnesium: 1.5 mg/dL — ABNORMAL LOW (ref 1.7–2.4)

## 2017-12-26 MED ORDER — TRAVASOL 10 % IV SOLN
INTRAVENOUS | Status: AC
  Administered 2017-12-26: 17:00:00 via INTRAVENOUS
  Filled 2017-12-26: qty 396

## 2017-12-26 MED ORDER — POTASSIUM PHOSPHATES 15 MMOLE/5ML IV SOLN
40.0000 meq | Freq: Once | INTRAVENOUS | Status: AC
  Filled 2017-12-26: qty 9.09

## 2017-12-26 MED ORDER — POTASSIUM PHOSPHATES 15 MMOLE/5ML IV SOLN
20.0000 mmol | Freq: Once | INTRAVENOUS | Status: AC
  Administered 2017-12-26: 20 mmol via INTRAVENOUS
  Filled 2017-12-26: qty 6.67

## 2017-12-26 MED ORDER — MAGNESIUM SULFATE 4 GM/100ML IV SOLN
4.0000 g | Freq: Once | INTRAVENOUS | Status: AC
  Administered 2017-12-26: 4 g via INTRAVENOUS
  Filled 2017-12-26: qty 100

## 2017-12-26 MED ORDER — LACTATED RINGERS IV SOLN
INTRAVENOUS | Status: AC
  Administered 2017-12-26 – 2017-12-27 (×5): via INTRAVENOUS

## 2017-12-26 MED ORDER — INSULIN ASPART 100 UNIT/ML ~~LOC~~ SOLN
0.0000 [IU] | SUBCUTANEOUS | Status: DC
  Administered 2017-12-26: 8 [IU] via SUBCUTANEOUS
  Administered 2017-12-26: 2 [IU] via SUBCUTANEOUS
  Administered 2017-12-26: 8 [IU] via SUBCUTANEOUS
  Administered 2017-12-28 (×2): 3 [IU] via SUBCUTANEOUS
  Administered 2017-12-28: 8 [IU] via SUBCUTANEOUS
  Administered 2017-12-28: 3 [IU] via SUBCUTANEOUS
  Administered 2017-12-28 (×2): 8 [IU] via SUBCUTANEOUS
  Administered 2017-12-29: 2 [IU] via SUBCUTANEOUS
  Administered 2017-12-29: 3 [IU] via SUBCUTANEOUS
  Administered 2017-12-29: 2 [IU] via SUBCUTANEOUS
  Administered 2017-12-29: 5 [IU] via SUBCUTANEOUS
  Administered 2017-12-29: 2 [IU] via SUBCUTANEOUS
  Administered 2017-12-30 (×2): 3 [IU] via SUBCUTANEOUS
  Administered 2017-12-30: 8 [IU] via SUBCUTANEOUS
  Administered 2017-12-30 (×2): 2 [IU] via SUBCUTANEOUS
  Administered 2017-12-30: 3 [IU] via SUBCUTANEOUS
  Administered 2018-01-01: 2 [IU] via SUBCUTANEOUS

## 2017-12-26 NOTE — H&P (View-Only) (Signed)
Subjective/Chief Complaint: Pt states that she feels less weak.  She got TNA started. She is passing gas and having small bms, but has still a "bad feeling" in abdomen.     Objective: Vital signs in last 24 hours: Temp:  [98.6 F (37 C)-99.3 F (37.4 C)] 99 F (37.2 C) (01/09 0447) Pulse Rate:  [93-104] 104 (01/09 0447) Resp:  [17-18] 17 (01/09 0447) BP: (92-115)/(40-57) 115/57 (01/09 0447) SpO2:  [97 %-100 %] 97 % (01/09 0447) Last BM Date: 12/25/17  Intake/Output from previous day: 01/08 0701 - 01/09 0700 In: 1220.8 [I.V.:1220.8] Out: 110 [Urine:110] Intake/Output this shift: No intake/output data recorded.  General appearance: alert, cooperative and no distress Resp: breathing normally GI: soft,  non distended, mild RLQ tenderness.   Extremities: extremities normal, atraumatic, no cyanosis or edema  Lab Results:  Recent Labs    12/25/17 0415  WBC 11.4*  HGB 7.2*  HCT 23.1*  PLT 730*   BMET Recent Labs    12/25/17 0415 12/26/17 0438  NA 138 137  K 3.5 3.2*  CL 101 104  CO2 26 26  GLUCOSE 187* 231*  BUN 13 12  CREATININE 0.66 0.57  CALCIUM 7.9* 7.9*   PT/INR Recent Labs    12/24/17 1301  LABPROT 15.6*  INR 1.25   ABG No results for input(s): PHART, HCO3 in the last 72 hours.  Invalid input(s): PCO2, PO2  Studies/Results: Ct Abdomen Pelvis W Contrast  Result Date: 12/24/2017 CLINICAL DATA:  Nausea and vomiting. History of neuroendocrine tumor with carcinomatosis, status post diagnostic laparoscopy with small bowel resection and partial omentectomy 12/04/2017. EXAM: CT ABDOMEN AND PELVIS WITH CONTRAST TECHNIQUE: Multidetector CT imaging of the abdomen and pelvis was performed using the standard protocol following bolus administration of intravenous contrast. CONTRAST:  166mL ISOVUE-300 IOPAMIDOL (ISOVUE-300) INJECTION 61% COMPARISON:  CT abdomen 12/17/2017, noncontrast abdomen/ pelvis CT 09/25/2017 FINDINGS: Lower chest: Breathing motion artifact  with mild hypoventilatory change at the right greater than left lung base. No pleural effusion or consolidation. No evidence pulmonary mass. Central line with tip at the atrial caval junction. Hepatobiliary: Subcapsular early enhancing lesion on prior CT is not visualized on this portal venous phase study. No new focal lesion. No biliary dilatation. Gallbladder physiologically distended. Pancreas: Pancreatic ductal prominence measuring 5 mm, chronic. Parenchymal atrophy. No peripancreatic inflammation. Spleen: Normal in size without focal abnormality. Adrenals/Urinary Tract: No adrenal nodule. No hydronephrosis or perinephric edema. Homogeneous renal enhancement with symmetric excretion on delayed phase imaging. Urinary bladder is physiologically distended without wall thickening. Stomach/Bowel: Lack of enteric contrast and paucity of intra-abdominal fat limits detailed bowel assessment. No abnormal gastric distension. Physiologic fluid in the stomach. Enteric sutures in the small bowel in the right abdomen. Arising from the enteric sutures is a tubular structure extending from the right upper quadrant of the pelvis containing air, fluid, and fecal material measuring approximately 20 cm cranial caudal, 9 x 11 cm (TR by AP) in the pelvis. Some hyperdensity is noted in the dependent portion. There there is thin peripheral enhancement. Only minimal surrounding inflammation. Apparent bowel connection is adjacent to enteric sutures, for example image 41 series 3 as well as in the right lower quadrant image 44 series 6. More distal small bowel is fluid-filled, mildly dilated measuring up to 4 cm. Small bowel proximally is nondilated. Small bowel distally is fluid-filled and prominent with mild mucosal enhancement. The colon is nondistended. Small volume of colonic stool. Vascular/Lymphatic: Aortic atherosclerosis, mild. No definite mesenteric adenopathy. Reproductive: Pessary  in place.  Post hysterectomy. Other: No  ascites.  No definite free air. Musculoskeletal: Advanced degenerative change in the spine. No acute osseous abnormality. IMPRESSION: 1. Post recent small bowel resection. Arising from the enteric sutures is a tubular structure containing air, fluid, and fecal material within peripheral enhancement. It is unclear whether this represents a dilated loop of small bowel or extraluminal collection secondary to contained anastomotic leak. There are two separate appearing connections to small bowel which favor bowel dilatation. Consider repeat exam with enteric contrast for further evaluation. 2. Early enhancing lesion on prior hepatic protocol CT is not seen, favoring vascular phenomenon. Electronically Signed   By: Jeb Levering M.D.   On: 12/24/2017 23:21    Anti-infectives: Anti-infectives (From admission, onward)   None      Assessment/Plan: S/p lap asst SBR for obstructing neuroendocrine tumor around 3 weeks ago.  CT looks like small bowel proximal to anastamosis has dilated up and is partially obstructed.  Question stricture due to poor blood flow?  Plan takeback to OR on Thursday.  Will be around 1 pm.  Plan to look laparoscopically and may need to redo anastamosis.   TNA for severe protein calorie malnutrition. Small volume clear liquids OK, but would not expect her to take much in. Mainly palliative.  Replete phos and mg for hypophosphatemia and hypomagnesemia.   LOS: 2 days    Theresa Howe 12/26/2017

## 2017-12-26 NOTE — Progress Notes (Signed)
Subjective/Chief Complaint: Pt states that she feels less weak.  She got TNA started. She is passing gas and having small bms, but has still a "bad feeling" in abdomen.     Objective: Vital signs in last 24 hours: Temp:  [98.6 F (37 C)-99.3 F (37.4 C)] 99 F (37.2 C) (01/09 0447) Pulse Rate:  [93-104] 104 (01/09 0447) Resp:  [17-18] 17 (01/09 0447) BP: (92-115)/(40-57) 115/57 (01/09 0447) SpO2:  [97 %-100 %] 97 % (01/09 0447) Last BM Date: 12/25/17  Intake/Output from previous day: 01/08 0701 - 01/09 0700 In: 1220.8 [I.V.:1220.8] Out: 110 [Urine:110] Intake/Output this shift: No intake/output data recorded.  General appearance: alert, cooperative and no distress Resp: breathing normally GI: soft,  non distended, mild RLQ tenderness.   Extremities: extremities normal, atraumatic, no cyanosis or edema  Lab Results:  Recent Labs    12/25/17 0415  WBC 11.4*  HGB 7.2*  HCT 23.1*  PLT 730*   BMET Recent Labs    12/25/17 0415 12/26/17 0438  NA 138 137  K 3.5 3.2*  CL 101 104  CO2 26 26  GLUCOSE 187* 231*  BUN 13 12  CREATININE 0.66 0.57  CALCIUM 7.9* 7.9*   PT/INR Recent Labs    12/24/17 1301  LABPROT 15.6*  INR 1.25   ABG No results for input(s): PHART, HCO3 in the last 72 hours.  Invalid input(s): PCO2, PO2  Studies/Results: Ct Abdomen Pelvis W Contrast  Result Date: 12/24/2017 CLINICAL DATA:  Nausea and vomiting. History of neuroendocrine tumor with carcinomatosis, status post diagnostic laparoscopy with small bowel resection and partial omentectomy 12/04/2017. EXAM: CT ABDOMEN AND PELVIS WITH CONTRAST TECHNIQUE: Multidetector CT imaging of the abdomen and pelvis was performed using the standard protocol following bolus administration of intravenous contrast. CONTRAST:  144mL ISOVUE-300 IOPAMIDOL (ISOVUE-300) INJECTION 61% COMPARISON:  CT abdomen 12/17/2017, noncontrast abdomen/ pelvis CT 09/25/2017 FINDINGS: Lower chest: Breathing motion artifact  with mild hypoventilatory change at the right greater than left lung base. No pleural effusion or consolidation. No evidence pulmonary mass. Central line with tip at the atrial caval junction. Hepatobiliary: Subcapsular early enhancing lesion on prior CT is not visualized on this portal venous phase study. No new focal lesion. No biliary dilatation. Gallbladder physiologically distended. Pancreas: Pancreatic ductal prominence measuring 5 mm, chronic. Parenchymal atrophy. No peripancreatic inflammation. Spleen: Normal in size without focal abnormality. Adrenals/Urinary Tract: No adrenal nodule. No hydronephrosis or perinephric edema. Homogeneous renal enhancement with symmetric excretion on delayed phase imaging. Urinary bladder is physiologically distended without wall thickening. Stomach/Bowel: Lack of enteric contrast and paucity of intra-abdominal fat limits detailed bowel assessment. No abnormal gastric distension. Physiologic fluid in the stomach. Enteric sutures in the small bowel in the right abdomen. Arising from the enteric sutures is a tubular structure extending from the right upper quadrant of the pelvis containing air, fluid, and fecal material measuring approximately 20 cm cranial caudal, 9 x 11 cm (TR by AP) in the pelvis. Some hyperdensity is noted in the dependent portion. There there is thin peripheral enhancement. Only minimal surrounding inflammation. Apparent bowel connection is adjacent to enteric sutures, for example image 41 series 3 as well as in the right lower quadrant image 44 series 6. More distal small bowel is fluid-filled, mildly dilated measuring up to 4 cm. Small bowel proximally is nondilated. Small bowel distally is fluid-filled and prominent with mild mucosal enhancement. The colon is nondistended. Small volume of colonic stool. Vascular/Lymphatic: Aortic atherosclerosis, mild. No definite mesenteric adenopathy. Reproductive: Pessary  in place.  Post hysterectomy. Other: No  ascites.  No definite free air. Musculoskeletal: Advanced degenerative change in the spine. No acute osseous abnormality. IMPRESSION: 1. Post recent small bowel resection. Arising from the enteric sutures is a tubular structure containing air, fluid, and fecal material within peripheral enhancement. It is unclear whether this represents a dilated loop of small bowel or extraluminal collection secondary to contained anastomotic leak. There are two separate appearing connections to small bowel which favor bowel dilatation. Consider repeat exam with enteric contrast for further evaluation. 2. Early enhancing lesion on prior hepatic protocol CT is not seen, favoring vascular phenomenon. Electronically Signed   By: Jeb Levering M.D.   On: 12/24/2017 23:21    Anti-infectives: Anti-infectives (From admission, onward)   None      Assessment/Plan: S/p lap asst SBR for obstructing neuroendocrine tumor around 3 weeks ago.  CT looks like small bowel proximal to anastamosis has dilated up and is partially obstructed.  Question stricture due to poor blood flow?  Plan takeback to OR on Thursday.  Will be around 1 pm.  Plan to look laparoscopically and may need to redo anastamosis.   TNA for severe protein calorie malnutrition. Small volume clear liquids OK, but would not expect her to take much in. Mainly palliative.  Replete phos and mg for hypophosphatemia and hypomagnesemia.   LOS: 2 days    Stark Klein 12/26/2017

## 2017-12-26 NOTE — Progress Notes (Signed)
[facility] NOTE   Pharmacy Consult for TPN Indication: Intolerance to enteral feeding  Patient Measurements: Height: 5\' 5"  (165.1 cm)(12/04/17) Weight: 127 lb (57.6 kg)(12/10/17) IBW/kg (Calculated) : 57 TPN AdjBW (KG): 57.6 Body mass index is 21.13 kg/m.  Assessment:  84 year old female with stage IV neuroendocrine small bowel cancer status post laparoscopic assisted small bowel resection in December who was re-admitted from SNF after she began having nausea and vomiting and unable to tolerate enteral feeding.   GI: s/p SBR in December. Intolerance to enteral feeds. Does not seem to be having emesis since admit. Albumin 1.7. Pre-albumin low 7.6. LBM 1/8. Had one Boost supplement yesterday. Endo: Hx of DM on Metformin pta. CBGs uncontrolled (150-360s) Insulin requirements in the past 24 hours: 34 units Lytes:wnl exc K down to 3.2. Phos and Mg low today. Never received Phos replacement yesterday. IVF- D5- LR at 100 mL/hr.  Renal: SCr stable, BUN 12. UOP not all charted Pulm: RA Cards: BP soft Hepatobil: LFTs wnl Neuro: ID: WBC up 11.4, Afebrile  TPN Access: PICC 12/24/17 TPN start date: 12/25/17 Nutritional Goals (per RD recommendation on 12/24/17): KCal: 1500-1700 Protein: 75-90 grams Fluid: >1.5L  Goal TPN rate is 60 ml/hr (provides 100% needs)  Current Nutrition: Clear liquid diet Boost 1 TIDWC (1 can = 240 kcal, 10g protein, 4g Fat, 41g Carbs) received 1 can yesterday  Plan:  Continue TPN at 30 mL/hr.  This TPN provides 40 g of protein, 108 g of dextrose, and 18 g of lipids which provides 706 kCals per day, meeting ~50% of patient needs. Will titrate to goal as tolerated. Electrolytes in TPN: increase K, Phos, Mg. Cl:Ac 1:1 Add MVI and trace elements, and add 25 units of regular insulin to TPN Continue Boost TID per MD Change moderate SSI to Q4h and adjust as needed Change D5-LR to LR at 70 ml/hr  Monitor TPN labs tomorrow F/U  toleration of Boost and clear liquids, OR on 1/10  Potassium Phosphorus 20 mmol x1 today  Magnesium 4g IV x1 today  Elenor Quinones, PharmD, [facility] Clinical Pharmacist Pager [phone] 12/26/2017 9:06 AM

## 2017-12-27 ENCOUNTER — Encounter (HOSPITAL_COMMUNITY)
Admission: AD | Disposition: A | Discharge status: Nursing Home (Includes ICF) | Payer: Self-pay | Source: Ambulatory Visit | Attending: General Surgery

## 2017-12-27 ENCOUNTER — Encounter (HOSPITAL_COMMUNITY): Payer: Self-pay | Admitting: Surgery

## 2017-12-27 ENCOUNTER — Inpatient Hospital Stay (HOSPITAL_COMMUNITY): Payer: Medicare Other | Admitting: Anesthesiology

## 2017-12-27 HISTORY — PX: LAPAROSCOPY: SHX197

## 2017-12-27 LAB — POCT I-STAT 4, (NA,K, GLUC, HGB,HCT)
Glucose, Bld: 152 mg/dL — ABNORMAL HIGH (ref 65–99)
HEMATOCRIT: 32 % — AB (ref 36.0–46.0)
Hemoglobin: 10.9 g/dL — ABNORMAL LOW (ref 12.0–15.0)
Potassium: 4.4 mmol/L (ref 3.5–5.1)
SODIUM: 136 mmol/L (ref 135–145)

## 2017-12-27 LAB — COMPREHENSIVE METABOLIC PANEL
ALBUMIN: 1.6 g/dL — AB (ref 3.5–5.0)
ALK PHOS: 88 U/L (ref 38–126)
ALT: 12 U/L — AB (ref 14–54)
AST: 21 U/L (ref 15–41)
Anion gap: 8 (ref 5–15)
BUN: 10 mg/dL (ref 6–20)
CHLORIDE: 104 mmol/L (ref 101–111)
CO2: 24 mmol/L (ref 22–32)
CREATININE: 0.48 mg/dL (ref 0.44–1.00)
Calcium: 7.6 mg/dL — ABNORMAL LOW (ref 8.9–10.3)
GFR calc Af Amer: >60 mL/min (ref >60)
GFR calc non Af Amer: >60 mL/min (ref >60)
GLUCOSE: 104 mg/dL — AB (ref 65–99)
Potassium: 3.6 mmol/L (ref 3.5–5.1)
SODIUM: 136 mmol/L (ref 135–145)
Total Bilirubin: 0.4 mg/dL (ref 0.3–1.2)
Total Protein: 4.5 g/dL — ABNORMAL LOW (ref 6.5–8.1)

## 2017-12-27 LAB — PREPARE RBC (CROSSMATCH)

## 2017-12-27 LAB — GLUCOSE, CAPILLARY
GLUCOSE-CAPILLARY: 115 mg/dL — AB (ref 65–99)
GLUCOSE-CAPILLARY: 131 mg/dL — AB (ref 65–99)
Glucose-Capillary: 101 mg/dL — ABNORMAL HIGH (ref 65–99)
Glucose-Capillary: 153 mg/dL — ABNORMAL HIGH (ref 65–99)
Glucose-Capillary: 77 mg/dL (ref 65–99)

## 2017-12-27 LAB — MAGNESIUM: Magnesium: 1.7 mg/dL (ref 1.7–2.4)

## 2017-12-27 LAB — PHOSPHORUS: PHOSPHORUS: 2.9 mg/dL (ref 2.5–4.6)

## 2017-12-27 SURGERY — LAPAROSCOPY, DIAGNOSTIC
Anesthesia: General | Site: Abdomen

## 2017-12-27 MED ORDER — ALBUMIN HUMAN 5 % IV SOLN
INTRAVENOUS | Status: DC | PRN
  Administered 2017-12-27 (×2): via INTRAVENOUS

## 2017-12-27 MED ORDER — FENTANYL CITRATE (PF) 250 MCG/5ML IJ SOLN
INTRAMUSCULAR | Status: AC
  Filled 2017-12-27: qty 5

## 2017-12-27 MED ORDER — CEFAZOLIN SODIUM-DEXTROSE 2-3 GM-%(50ML) IV SOLR
INTRAVENOUS | Status: DC | PRN
  Administered 2017-12-27: 2 g via INTRAVENOUS

## 2017-12-27 MED ORDER — MAGNESIUM SULFATE IN D5W 1-5 GM/100ML-% IV SOLN
1.0000 g | Freq: Once | INTRAVENOUS | Status: DC
  Filled 2017-12-27: qty 100

## 2017-12-27 MED ORDER — FLUTICASONE PROPIONATE 50 MCG/ACT NA SUSP
2.0000 | Freq: Every day | NASAL | Status: DC
Start: 2017-12-27 — End: 2018-01-26
  Administered 2017-12-27 – 2018-01-25 (×28): 2 via NASAL
  Filled 2017-12-27 (×2): qty 16

## 2017-12-27 MED ORDER — SODIUM CHLORIDE 0.9 % IV SOLN: Freq: Once | INTRAVENOUS | Status: DC

## 2017-12-27 MED ORDER — DEXAMETHASONE SODIUM PHOSPHATE 10 MG/ML IJ SOLN
INTRAMUSCULAR | Status: AC
  Filled 2017-12-27: qty 1

## 2017-12-27 MED ORDER — BUPIVACAINE LIPOSOME 1.3 % IJ SUSP
INTRAMUSCULAR | Status: DC | PRN
  Administered 2017-12-27: 20 mL

## 2017-12-27 MED ORDER — FENTANYL CITRATE (PF) 100 MCG/2ML IJ SOLN: 25.0000 ug | INTRAMUSCULAR | Status: DC | PRN

## 2017-12-27 MED ORDER — MIDAZOLAM HCL 2 MG/2ML IJ SOLN
INTRAMUSCULAR | Status: AC
  Filled 2017-12-27: qty 2

## 2017-12-27 MED ORDER — PHENYLEPHRINE 40 MCG/ML (10ML) SYRINGE FOR IV PUSH (FOR BLOOD PRESSURE SUPPORT)
PREFILLED_SYRINGE | INTRAVENOUS | Status: DC | PRN
  Administered 2017-12-27 (×2): 120 ug via INTRAVENOUS
  Administered 2017-12-27: 80 ug via INTRAVENOUS

## 2017-12-27 MED ORDER — SODIUM CHLORIDE 0.9 % IJ SOLN
INTRAMUSCULAR | Status: DC | PRN
  Administered 2017-12-27: 20 mL

## 2017-12-27 MED ORDER — DEXAMETHASONE SODIUM PHOSPHATE 10 MG/ML IJ SOLN
INTRAMUSCULAR | Status: DC | PRN
  Administered 2017-12-27: 10 mg via INTRAVENOUS

## 2017-12-27 MED ORDER — SODIUM CHLORIDE 0.9 % IV SOLN
INTRAVENOUS | Status: DC | PRN
  Administered 2017-12-27: 16:00:00 via INTRAVENOUS

## 2017-12-27 MED ORDER — MORPHINE SULFATE (PF) 4 MG/ML IV SOLN
1.0000 mg | INTRAVENOUS | Status: DC | PRN
  Administered 2017-12-30 – 2017-12-31 (×3): 1 mg via INTRAVENOUS
  Administered 2017-12-31 – 2018-01-17 (×9): 2 mg via INTRAVENOUS
  Filled 2017-12-27 (×12): qty 1

## 2017-12-27 MED ORDER — ESMOLOL HCL 100 MG/10ML IV SOLN
INTRAVENOUS | Status: DC | PRN
  Administered 2017-12-27 (×3): 30 mg via INTRAVENOUS
  Administered 2017-12-27: 20 mg via INTRAVENOUS

## 2017-12-27 MED ORDER — BUPIVACAINE-EPINEPHRINE (PF) 0.25% -1:200000 IJ SOLN
INTRAMUSCULAR | Status: AC
  Filled 2017-12-27: qty 30

## 2017-12-27 MED ORDER — PROPOFOL 10 MG/ML IV BOLUS
INTRAVENOUS | Status: AC
  Filled 2017-12-27: qty 40

## 2017-12-27 MED ORDER — LIDOCAINE HCL (PF) 1 % IJ SOLN
INTRAMUSCULAR | Status: AC
  Filled 2017-12-27: qty 30

## 2017-12-27 MED ORDER — 0.9 % SODIUM CHLORIDE (POUR BTL) OPTIME
TOPICAL | Status: DC | PRN
  Administered 2017-12-27: 1000 mL
  Administered 2017-12-27: 2000 mL
  Administered 2017-12-27 (×2): 1000 mL
  Administered 2017-12-27: 2000 mL

## 2017-12-27 MED ORDER — ESMOLOL HCL 100 MG/10ML IV SOLN
INTRAVENOUS | Status: AC
  Filled 2017-12-27: qty 10

## 2017-12-27 MED ORDER — SODIUM CHLORIDE 0.9 % IV SOLN: 10.0000 mL/h | Freq: Once | INTRAVENOUS | Status: DC

## 2017-12-27 MED ORDER — BUPIVACAINE LIPOSOME 1.3 % IJ SUSP
20.0000 mL | Freq: Once | INTRAMUSCULAR | Status: DC
  Filled 2017-12-27: qty 20

## 2017-12-27 MED ORDER — LIDOCAINE 2% (20 MG/ML) 5 ML SYRINGE
INTRAMUSCULAR | Status: DC | PRN
  Administered 2017-12-27: 100 mg via INTRAVENOUS

## 2017-12-27 MED ORDER — FENTANYL CITRATE (PF) 250 MCG/5ML IJ SOLN
INTRAMUSCULAR | Status: DC | PRN
  Administered 2017-12-27: 100 ug via INTRAVENOUS
  Administered 2017-12-27 (×3): 50 ug via INTRAVENOUS

## 2017-12-27 MED ORDER — ONDANSETRON HCL 4 MG/2ML IJ SOLN
INTRAMUSCULAR | Status: AC
  Filled 2017-12-27: qty 2

## 2017-12-27 MED ORDER — TRAVASOL 10 % IV SOLN
INTRAVENOUS | Status: AC
  Administered 2017-12-27: 20:00:00 via INTRAVENOUS
  Filled 2017-12-27 (×3): qty 594

## 2017-12-27 MED ORDER — ROCURONIUM BROMIDE 10 MG/ML (PF) SYRINGE
PREFILLED_SYRINGE | INTRAVENOUS | Status: DC | PRN
  Administered 2017-12-27 (×2): 10 mg via INTRAVENOUS
  Administered 2017-12-27: 20 mg via INTRAVENOUS
  Administered 2017-12-27: 40 mg via INTRAVENOUS

## 2017-12-27 MED ORDER — PROPOFOL 10 MG/ML IV BOLUS
INTRAVENOUS | Status: DC | PRN
  Administered 2017-12-27: 110 mg via INTRAVENOUS

## 2017-12-27 MED ORDER — ONDANSETRON HCL 4 MG/2ML IJ SOLN: 4.0000 mg | Freq: Once | INTRAMUSCULAR | Status: DC | PRN

## 2017-12-27 MED ORDER — PHENYLEPHRINE HCL 10 MG/ML IJ SOLN
INTRAMUSCULAR | Status: DC | PRN
  Administered 2017-12-27: 20 ug/min via INTRAVENOUS

## 2017-12-27 MED ORDER — SUGAMMADEX SODIUM 200 MG/2ML IV SOLN
INTRAVENOUS | Status: DC | PRN
  Administered 2017-12-27: 225 mg via INTRAVENOUS

## 2017-12-27 MED ORDER — FENTANYL CITRATE (PF) 100 MCG/2ML IJ SOLN
INTRAMUSCULAR | Status: AC
  Filled 2017-12-27: qty 2

## 2017-12-27 MED ORDER — LIDOCAINE HCL 1 % IJ SOLN
INTRAMUSCULAR | Status: DC | PRN
  Administered 2017-12-27: 2 mL via INTRAMUSCULAR

## 2017-12-27 MED ORDER — LACTATED RINGERS IV SOLN
INTRAVENOUS | Status: DC | PRN
  Administered 2017-12-27: 15:00:00 via INTRAVENOUS

## 2017-12-27 MED ORDER — LACTATED RINGERS IV SOLN
INTRAVENOUS | Status: AC
  Administered 2017-12-28: 06:00:00 via INTRAVENOUS

## 2017-12-27 MED ORDER — CEFAZOLIN SODIUM 1 G IJ SOLR
INTRAMUSCULAR | Status: AC
  Filled 2017-12-27: qty 20

## 2017-12-27 MED ORDER — ONDANSETRON HCL 4 MG/2ML IJ SOLN
INTRAMUSCULAR | Status: DC | PRN
  Administered 2017-12-27: 4 mg via INTRAVENOUS

## 2017-12-27 SURGICAL SUPPLY — 88 items
APPLIER CLIP ROT 10 11.4 M/L (STAPLE)
BANDAGE ADH SHEER 1  50/CT (GAUZE/BANDAGES/DRESSINGS) ×4 IMPLANT
BLADE CLIPPER SURG (BLADE) IMPLANT
BNDG GAUZE ELAST 4 BULKY (GAUZE/BANDAGES/DRESSINGS) ×4 IMPLANT
CANISTER SUCT 3000ML PPV (MISCELLANEOUS) ×4 IMPLANT
CELLS DAT CNTRL 66122 CELL SVR (MISCELLANEOUS) IMPLANT
CHLORAPREP W/TINT 26ML (MISCELLANEOUS) ×4 IMPLANT
CLIP APPLIE ROT 10 11.4 M/L (STAPLE) IMPLANT
CLOSURE WOUND 1/2 X4 (GAUZE/BANDAGES/DRESSINGS) ×1
COVER SURGICAL LIGHT HANDLE (MISCELLANEOUS) ×4 IMPLANT
DECANTER SPIKE VIAL GLASS SM (MISCELLANEOUS) ×8 IMPLANT
DISSECTOR BLUNT TIP ENDO 5MM (MISCELLANEOUS) IMPLANT
DRAPE HALF SHEET 40X57 (DRAPES) IMPLANT
DRAPE LAPAROSCOPIC ABDOMINAL (DRAPES) ×4 IMPLANT
DRAPE UTILITY XL STRL (DRAPES) ×8 IMPLANT
DRAPE WARM FLUID 44X44 (DRAPE) ×4 IMPLANT
ELECT BLADE 6.5 EXT (BLADE) IMPLANT
ELECT CAUTERY BLADE 6.4 (BLADE) ×8 IMPLANT
ELECT REM PT RETURN 9FT ADLT (ELECTROSURGICAL) ×4
ELECTRODE REM PT RTRN 9FT ADLT (ELECTROSURGICAL) ×2 IMPLANT
GAUZE SPONGE 4X4 12PLY STRL (GAUZE/BANDAGES/DRESSINGS) ×4 IMPLANT
GEL ULTRASOUND 20GR AQUASONIC (MISCELLANEOUS) IMPLANT
GLOVE BIO SURGEON STRL SZ 6 (GLOVE) ×8 IMPLANT
GLOVE INDICATOR 6.5 STRL GRN (GLOVE) ×4 IMPLANT
GOWN STRL REUS W/ TWL LRG LVL3 (GOWN DISPOSABLE) ×4 IMPLANT
GOWN STRL REUS W/TWL 2XL LVL3 (GOWN DISPOSABLE) ×8 IMPLANT
GOWN STRL REUS W/TWL LRG LVL3 (GOWN DISPOSABLE) ×4
KIT BASIN OR (CUSTOM PROCEDURE TRAY) ×4 IMPLANT
KIT ROOM TURNOVER OR (KITS) ×4 IMPLANT
L-HOOK LAP DISP 36CM (ELECTROSURGICAL) ×4
LEGGING LITHOTOMY PAIR STRL (DRAPES) IMPLANT
LHOOK LAP DISP 36CM (ELECTROSURGICAL) ×2 IMPLANT
LIGASURE IMPACT 36 18CM CVD LR (INSTRUMENTS) ×4 IMPLANT
LIGASURE MARYLAND LAP STAND (ELECTROSURGICAL) IMPLANT
NS IRRIG 1000ML POUR BTL (IV SOLUTION) ×8 IMPLANT
PACK GENERAL/GYN (CUSTOM PROCEDURE TRAY) ×4 IMPLANT
PAD ABD 8X10 STRL (GAUZE/BANDAGES/DRESSINGS) ×4 IMPLANT
PAD ARMBOARD 7.5X6 YLW CONV (MISCELLANEOUS) ×8 IMPLANT
PENCIL BUTTON HOLSTER BLD 10FT (ELECTRODE) ×8 IMPLANT
RELOAD PROXIMATE 75MM BLUE (ENDOMECHANICALS) ×16 IMPLANT
RELOAD PROXIMATE TA60MM BLUE (ENDOMECHANICALS) ×4 IMPLANT
RTRCTR WOUND ALEXIS 18CM MED (MISCELLANEOUS)
SCISSORS LAP 5X35 DISP (ENDOMECHANICALS) IMPLANT
SET IRRIG TUBING LAPAROSCOPIC (IRRIGATION / IRRIGATOR) IMPLANT
SHEARS HARMONIC ACE PLUS 36CM (ENDOMECHANICALS) IMPLANT
SLEEVE ENDOPATH XCEL 5M (ENDOMECHANICALS) ×4 IMPLANT
SPECIMEN JAR LARGE (MISCELLANEOUS) ×4 IMPLANT
SPONGE LAP 18X18 X RAY DECT (DISPOSABLE) ×28 IMPLANT
STAPLER GUN LINEAR PROX 60 (STAPLE) ×4 IMPLANT
STAPLER PROXIMATE 75MM BLUE (STAPLE) ×4 IMPLANT
STAPLER VISISTAT 35W (STAPLE) ×4 IMPLANT
STRIP CLOSURE SKIN 1/2X4 (GAUZE/BANDAGES/DRESSINGS) ×3 IMPLANT
SUCTION POOLE TIP (SUCTIONS) ×4 IMPLANT
SURGILUBE 2OZ TUBE FLIPTOP (MISCELLANEOUS) IMPLANT
SUT MNCRL AB 4-0 PS2 18 (SUTURE) ×4 IMPLANT
SUT PDS AB 1 CT  36 (SUTURE)
SUT PDS AB 1 CT 36 (SUTURE) IMPLANT
SUT PDS AB 1 TP1 96 (SUTURE) ×8 IMPLANT
SUT PROLENE 2 0 CT2 30 (SUTURE) IMPLANT
SUT PROLENE 2 0 KS (SUTURE) IMPLANT
SUT SILK 2 0 (SUTURE) ×2
SUT SILK 2 0 SH CR/8 (SUTURE) ×4 IMPLANT
SUT SILK 2-0 18XBRD TIE 12 (SUTURE) ×2 IMPLANT
SUT SILK 3 0 (SUTURE) ×2
SUT SILK 3 0 SH CR/8 (SUTURE) ×4 IMPLANT
SUT SILK 3-0 18XBRD TIE 12 (SUTURE) ×2 IMPLANT
SUT VIC AB 2-0 SH 18 (SUTURE) ×4 IMPLANT
SUT VIC AB 3-0 SH 18 (SUTURE) ×8 IMPLANT
SUT VICRYL AB 2 0 TIES (SUTURE) ×4 IMPLANT
SUT VICRYL AB 3 0 TIES (SUTURE) ×4 IMPLANT
SYS LAPSCP GELPORT 120MM (MISCELLANEOUS)
SYSTEM LAPSCP GELPORT 120MM (MISCELLANEOUS) IMPLANT
TAPE CLOTH SURG 6X10 WHT LF (GAUZE/BANDAGES/DRESSINGS) ×4 IMPLANT
TOWEL OR 17X24 6PK STRL BLUE (TOWEL DISPOSABLE) ×4 IMPLANT
TOWEL OR 17X26 10 PK STRL BLUE (TOWEL DISPOSABLE) ×4 IMPLANT
TRAY FOLEY W/METER SILVER 14FR (SET/KITS/TRAYS/PACK) IMPLANT
TRAY FOLEY W/METER SILVER 16FR (SET/KITS/TRAYS/PACK) IMPLANT
TRAY LAPAROSCOPIC MC (CUSTOM PROCEDURE TRAY) ×4 IMPLANT
TROCAR XCEL 12X100 BLDLESS (ENDOMECHANICALS) IMPLANT
TROCAR XCEL BLUNT TIP 100MML (ENDOMECHANICALS) ×4 IMPLANT
TROCAR XCEL NON-BLD 11X100MML (ENDOMECHANICALS) IMPLANT
TROCAR XCEL NON-BLD 5MMX100MML (ENDOMECHANICALS) ×4 IMPLANT
TUBE CONNECTING 12'X1/4 (SUCTIONS) ×1
TUBE CONNECTING 12X1/4 (SUCTIONS) ×3 IMPLANT
TUBING INSUF HEATED (TUBING) ×4 IMPLANT
TUBING INSUFFLATION (TUBING) ×4 IMPLANT
WATER STERILE IRR 1000ML POUR (IV SOLUTION) ×4 IMPLANT
YANKAUER SUCT BULB TIP NO VENT (SUCTIONS) ×8 IMPLANT

## 2017-12-27 NOTE — Anesthesia Procedure Notes (Signed)
Procedure Name: Intubation Date/Time: 12/27/2017 2:48 PM Performed by: Harden Mo, CRNA Pre-anesthesia Checklist: Patient identified, Emergency Drugs available, Suction available and Patient being monitored Patient Re-evaluated:Patient Re-evaluated prior to induction Oxygen Delivery Method: Circle System Utilized Preoxygenation: Pre-oxygenation with 100% oxygen Induction Type: IV induction Ventilation: Mask ventilation without difficulty Laryngoscope Size: Miller and 2 Grade View: Grade I Tube type: Oral Tube size: 7.0 mm Number of attempts: 1 Airway Equipment and Method: Stylet and Oral airway Placement Confirmation: ETT inserted through vocal cords under direct vision,  positive ETCO2 and breath sounds checked- equal and bilateral Secured at: 20 cm Tube secured with: Tape Dental Injury: Teeth and Oropharynx as per pre-operative assessment

## 2017-12-27 NOTE — Op Note (Signed)
PRE-OPERATIVE DIAGNOSIS: partial small bowel obstruction  POST-OPERATIVE DIAGNOSIS:  Same, with large dilated segment of bowel proximal to the anastamosis with bezoar in it  PROCEDURE:  Procedure(s): Diagnostic laparoscopy, exploratory laparotomy, small bowel resection x 2.    SURGEON:  Surgeon(s): Stark Klein, MD  ANESTHESIA:   general and exparel  DRAINS: none   LOCAL MEDICATIONS USED:  OTHER exparel  SPECIMEN:  Source of Specimen:  small bowel resection x 2  DISPOSITION OF SPECIMEN:  PATHOLOGY  COUNTS:  YES  DICTATION: .Dragon Dictation  PLAN OF CARE: Admit to inpatient   PATIENT DISPOSITION:  PACU - hemodynamically stable.  FINDINGS:  Large dilated segment of bowel proximal to anastamosis with bezoar  EBL: 300 mL   PROCEDURE:   Patient was identified in the holding area and taken to the operating room where she was placed supine on the operating table.  General endotracheal anesthesia was induced.  Foley catheter was placed.  Arms were tucked.  Her abdomen was prepped and draped in sterile fashion.  A timeout was performed according to the surgical safety checklist.  All was correct, we continued.  Patient was placed into reverse in the left position and rotated to the right.  Local anesthetic was administered at the left costal margin.  The 5 mm Optiview trocar was placed under direct visualization.  Pneumoperitoneum was achieved to pressure 15 mmHg.  A second 5 mm trocar was placed in the left abdomen.  At the superior aspect of the previous incision, there was seen to be dense adhesions to the abdominal wall.  These were not able to be taken down easily.  Decision was made to open her prior midline extraction site.    The skin was opened with a #10 blade.  The subcutaneous tissues were opened with the cautery.  The fascia was opened sharply.  This was elevated with Kochers.  On the left side, the adhesions were able to be taken down easily from the abdominal wall.   However, on the right the omentum and bowel was extremely adherent.  These were started to be taken down sharply and entered a large dilated loop of bowel with copious amounts of succus.  This was aspirated.  There was a soft bezoar in this location as well that was pulled out.  This loop of bowel was densely adherent to all the surrounding structures.  Sharp dissection and blunt dissection were used to eviscerate the small bowel and run it from proximal to distal.  This was also volvulized at one point.  The cecum and terminal ileum were found.  This was traced backwards and approximately 10-15 cm proximal to the ileocecal valve, the bowel was densely adherent in some sort of matted series of loops of bowel.  The ligament of Treitz was located and then run distally.  The bowel was adherent to this knot of matted bowel as well.  This was taken down sharply.  Due to the dense adhesions, a full-thickness defect was created.  This was held in place while the remainder the bowel was run.  Once we got quite a bit further, the small bowel tracked underneath the colon toward the right upper quadrant.  Finger fracture technique was used to gently get this bowel back down.  This bowel was intact.  However, there was the portion that dilated up of the anastomosis that was not salvageable.  It was dilated and stretched out and thinned out.  This was resected en bloc with the matted loop  of bowel as attempts to free this loop up sharply resulted in too many full thickness defects.  This portion was resected.  The LigaSure was used to take down the mesentery.  The more proximal area with defect was also resected with the GIA-75 and the LigaSure.  Part of the wall of the dilated bowel structure was adherent to the wall of the small bowel.  Since that bowel was viable, the portion of bowel wall was left in place.  Bowel was then run from distal to proximal and the remaining bowel had no evidence of serosal defects or  otherwise.  The bowel had good blood flow.  A peristaltic direction side-to-side, functional end-to-end anastomosis was created between proximal ileum and the cecum.  The terminal ileum was resected from the cecum.  The staple line was oversewn using 3-0 Vicryl pops.  The caliber of the anastomosis was adequate.  The NG tube position was checked.  The abdomen was irrigated copiously.  It was also examined.  There was actually no abscess in the abdomen, just the dilated bowel that opened up with attempts to free it up.  Patient maintained good heart rate and blood pressure and required antihypertensives at one point during the case.  She did receive blood as she was starting out relatively anemic.  The fascia was closed using #1 looped PDS suture x2.  Skin was irrigated and left open with Kerlix.  The port sites were closed with 4-0 Monocryl.  The patient was allowed to emerge from anesthesia and was taken to the PACU in stable condition.  Needle, sponge, and instrument counts were correct x2.

## 2017-12-27 NOTE — Progress Notes (Signed)
1115 Patient taken down to OR for scheduled procedure.

## 2017-12-27 NOTE — Transfer of Care (Signed)
Immediate Anesthesia Transfer of Care Note  Patient: Theresa Howe  Procedure(s) Performed: DIAGNOSTIC LAPAROSCOPY/ LAPAROTOMY WITH SMALL BOWEL RESECTION TIMES TWO (N/A Abdomen)  Patient Location: PACU  Anesthesia Type:General  Level of Consciousness: drowsy  Airway & Oxygen Therapy: Patient Spontanous Breathing and Patient connected to nasal cannula oxygen  Post-op Assessment: Report given to RN and Post -op Vital signs reviewed and stable  Post vital signs: Reviewed and stable  Last Vitals:  Vitals:   12/26/17 2030 12/27/17 0449  BP: (!) 112/56 (!) 108/55  Pulse: 90 89  Resp: 17 17  Temp: 36.8 C 36.8 C  SpO2: 99% 98%    Last Pain:  Vitals:   12/27/17 0449  TempSrc: Oral  PainSc:          Complications: No apparent anesthesia complications

## 2017-12-27 NOTE — Progress Notes (Signed)
Nutrition Follow-up  DOCUMENTATION CODES:   Severe malnutrition in context of chronic illness  INTERVENTION:   -TPN management per pharmacy -Once diet is advanced, continue Boost Breeze po TID, each supplement provides 250 kcal and 9 grams of protein  NUTRITION DIAGNOSIS:   Severe Malnutrition related to chronic illness(stage IV neuroendocrine tumor) as evidenced by energy intake < 75% for > or equal to 1 month, severe fat depletion, severe muscle depletion.  Ongoing  GOAL:   Patient will meet greater than or equal to 90% of their needs  Progressing  MONITOR:   PO intake, Diet advancement, Labs, Weight trends, Skin, I & O's  REASON FOR ASSESSMENT:   Malnutrition Screening Tool, Consult New TPN/TNA  ASSESSMENT:   Patient is a 84 year old female who is status post laparoscopic assisted small bowel resection in mid December.  She was discharged to a skilled nursing facility.  She was doing better, but now is throwing up almost everything that she eats.  She is able to keep down a small amount of Ensure.  She also continues to have some postop pain.  She denies fevers and chills.  She is having difficulty working with physical therapy due to weakness.  She is also having difficulty taking her medication because of nausea and vomiting.  Pt down in OR for procedure today.    Reviewed general surgery notes; CT revealed unusual dilated structure near anastamosis that seems to be bowel loop (appears small bowel proximal to anastamosis has dilated up and is partially obstructed). Pt will undergo  diagnostic laparoscopy, lysis of adhesions, possible SMALL BOWEL RESECTION today.   Pt was receiving clear liquids, but tolerating very little. Pt has refused most of Boost Breeze supplements. Per MD notes, intake is mainly for pleasure.   Per pharmacy notes, plan to continue TPN at 30 mL/hr, which provides 40 g of protein, 108 g of dextrose, and 18 g of lipids which provides 706 kCals per  day, meeting ~50% of patient needs, with plan to titrate to goal as tolerated.   Labs reviewed: CBGS: 115, Mg, K, and Phos WDL.   Diet Order:  TPN ADULT (ION) TPN ADULT (ION)  EDUCATION NEEDS:   Education needs have been addressed  Skin:  Skin Integrity Issues:: Incisions, Stage II Stage II: coccyx, buttocks Incisions: closed abdominal incision  Last BM:  12/26/17  Height:   Ht Readings from Last 1 Encounters:  12/24/17 5\' 5"  (1.651 m)    Weight:   Wt Readings from Last 1 Encounters:  12/24/17 127 lb (57.6 kg)    Ideal Body Weight:  56.8 kg  BMI:  Body mass index is 21.13 kg/m.  Estimated Nutritional Needs:   Kcal:  1500-1700  Protein:  75-90 grams  Fluid:  > 1.5 L    Khloi Rawl A. Jimmye Norman, RD, LDN, CDE Pager: [phone] After hours Pager: [phone]

## 2017-12-27 NOTE — Anesthesia Preprocedure Evaluation (Addendum)
Anesthesia Evaluation  Patient identified by MRN, date of birth, ID band Patient awake    Reviewed: Allergy & Precautions, NPO status , Patient's Chart, lab work & pertinent test results  Airway Mallampati: II  TM Distance: >3 FB Neck ROM: Full    Dental  (+) Dental Advisory Given, Edentulous Upper, Edentulous Lower   Pulmonary shortness of breath,    Pulmonary exam normal breath sounds clear to auscultation       Cardiovascular hypertension, Pt. on medications Normal cardiovascular exam Rhythm:Regular Rate:Normal     Neuro/Psych PSYCHIATRIC DISORDERS Anxiety negative neurological ROS     GI/Hepatic Neg liver ROS, GERD  Medicated,SBO   Endo/Other  diabetes, Type 2, Oral Hypoglycemic AgentsFailure to thrive  Renal/GU negative Renal ROS     Musculoskeletal negative musculoskeletal ROS (+)   Abdominal   Peds  Hematology  (+) Blood dyscrasia, anemia ,   Anesthesia Other Findings Day of surgery medications reviewed with the patient.  Reproductive/Obstetrics                            Anesthesia Physical Anesthesia Plan  ASA: III  Anesthesia Plan: General   Post-op Pain Management:    Induction: Intravenous and Rapid sequence  PONV Risk Score and Plan: 3 and Dexamethasone, Ondansetron and Treatment may vary due to age or medical condition  Airway Management Planned: Oral ETT  Additional Equipment:   Intra-op Plan:   Post-operative Plan: Possible Post-op intubation/ventilation  Informed Consent: I have reviewed the patients History and Physical, chart, labs and discussed the procedure including the risks, benefits and alternatives for the proposed anesthesia with the patient or authorized representative who has indicated his/her understanding and acceptance.   Dental advisory given  Plan Discussed with: CRNA  Anesthesia Plan Comments: (Risks/benefits of general anesthesia  discussed with patient including risk of damage to teeth, lips, gum, and tongue, nausea/vomiting, allergic reactions to medications, and the possibility of heart attack, stroke and death.  All patient questions answered.  Patient wishes to proceed.)        Anesthesia Quick Evaluation

## 2017-12-27 NOTE — Progress Notes (Signed)
PHARMACY - ADULT TOTAL PARENTERAL NUTRITION CONSULT NOTE   Pharmacy Consult for TPN Indication: Intolerance to enteral feeding  Patient Measurements: Height: 5\' 5"  (165.1 cm)(12/04/17) Weight: 127 lb (57.6 kg)(12/10/17) IBW/kg (Calculated) : 57 TPN AdjBW (KG): 57.6 Body mass index is 21.13 kg/m.  Assessment:  84 year old female with stage IV neuroendocrine small bowel cancer status post laparoscopic assisted small bowel resection in December who was re-admitted from SNF after she began having nausea and vomiting and unable to tolerate enteral feeding.   GI: s/p SBR in December, planning back to OR 1/10 for ex-lap and possible re-do anastamosis. Intolerance to enteral feeds. Does not seem to be having emesis since admit. Albumin 1.6. Pre-albumin low 7.6. LBM 1/9. Refused all supplements yesterday. Endo: Hx of DM on Metformin pta. CBGs 77-127 since starting TPN with insulin Insulin requirements in the past 12 hours: 2 units Lytes:K 3.6, Phos 2.9 << 1.7 (KPhos 20 mmol x 1 on 1/9), Mg 1.7 << 1.5 (Mg 4g IV x 1 on 1/9). Na/Cl wnl, CoCa 9.5, CO3 24. On LR at 70 mL/hr.  Renal: SCr stable, BUN 10. UOP not all charted Pulm: RA Cards: BP soft Hepatobil: LFTs wnl Neuro: ID: WBC up 11.4, Afebrile  TPN Access: PICC 12/24/17 TPN start date: 12/25/17 Nutritional Goals (per RD recommendation on 12/24/17): KCal: 1500-1700 Protein: 75-90 grams Fluid: >1.5L  Goal TPN rate is 60 ml/hr (provides 100% needs)  Current Nutrition: Clear liquid diet Boost 1 TIDWC (1 can = 240 kcal, 10g protein, 4g Fat, 41g Carbs) received 1 can yesterday  Plan:  Increase TPN to 45 mL/hr.  This TPN provides 59 g of protein, 162 g of dextrose, and 27g of lipids which provides 1058 kCals per day, meeting ~70% of patient needs. Will titrate to goal as tolerated. Electrolytes in TPN: increased K, Phos, Mg. Cl:Ac 1:1 Add MVI and trace elements, and add 35 units of regular insulin to TPN Continue Boost TID per MD Change  moderate SSI to Q4h and adjust as needed Decrease LR to 55 ml/hr when TPN rate increased today Monitor TPN labs tomorrow F/U toleration of Boost and clear liquids, OR on 1/10  Magnesium 1g IV x1 today  Thank you for allowing pharmacy to be a part of this patient's care.  Alycia Rossetti, PharmD, BCPS Clinical Pharmacist Pager: [phone] Clinical phone for 12/27/2017 from 7a-3:30p: [phone] If after 3:30p, please call main pharmacy at: [phone] 12/27/2017 9:55 AM

## 2017-12-27 NOTE — Interval H&P Note (Signed)
History and Physical Interval Note:  12/27/2017 10:35 AM  Theresa Howe  has presented today for surgery, with the diagnosis of partial small bowel obstruction.  The various methods of treatment have been discussed with the patient and family. After consideration of risks, benefits and other options for treatment, the patient has consented to  Procedure(s): Diagnostic laparoscopy, lysis of adhesions, possible SMALL BOWEL RESECTION (N/A) as a surgical intervention .  The patient's history has been reviewed, patient examined, no change in status, stable for surgery.  I have reviewed the patient's chart and labs.  Questions were answered to the patient's satisfaction.     Stark Klein

## 2017-12-28 ENCOUNTER — Encounter (HOSPITAL_COMMUNITY): Payer: Self-pay | Admitting: General Surgery

## 2017-12-28 ENCOUNTER — Inpatient Hospital Stay (HOSPITAL_COMMUNITY): Payer: Medicare Other

## 2017-12-28 LAB — PHOSPHORUS: PHOSPHORUS: 2.4 mg/dL — AB (ref 2.5–4.6)

## 2017-12-28 LAB — POCT I-STAT EG7
ACID-BASE DEFICIT: 5 mmol/L — AB (ref 0.0–2.0)
Bicarbonate: 21.7 mmol/L (ref 20.0–28.0)
Calcium, Ion: 1.12 mmol/L — ABNORMAL LOW (ref 1.15–1.40)
HCT: 29 % — ABNORMAL LOW (ref 36.0–46.0)
HEMOGLOBIN: 9.9 g/dL — AB (ref 12.0–15.0)
O2 SAT: 70 %
PCO2 VEN: 48.3 mmHg (ref 44.0–60.0)
PH VEN: 7.26 (ref 7.250–7.430)
POTASSIUM: 6.9 mmol/L — AB (ref 3.5–5.1)
Sodium: 129 mmol/L — ABNORMAL LOW (ref 135–145)
TCO2: 23 mmol/L (ref 22–32)
pO2, Ven: 42 mmHg (ref 32.0–45.0)

## 2017-12-28 LAB — GLUCOSE, CAPILLARY
GLUCOSE-CAPILLARY: 135 mg/dL — AB (ref 65–99)
GLUCOSE-CAPILLARY: 157 mg/dL — AB (ref 65–99)
GLUCOSE-CAPILLARY: 268 mg/dL — AB (ref 65–99)
GLUCOSE-CAPILLARY: 268 mg/dL — AB (ref 65–99)
GLUCOSE-CAPILLARY: 275 mg/dL — AB (ref 65–99)
Glucose-Capillary: 275 mg/dL — ABNORMAL HIGH (ref 65–99)
Glucose-Capillary: 197 mg/dL — ABNORMAL HIGH (ref 65–99)
Glucose-Capillary: 187 mg/dL — ABNORMAL HIGH (ref 65–99)

## 2017-12-28 LAB — BASIC METABOLIC PANEL
ANION GAP: 5 (ref 5–15)
BUN: 12 mg/dL (ref 6–20)
CALCIUM: 7.5 mg/dL — AB (ref 8.9–10.3)
CO2: 23 mmol/L (ref 22–32)
CREATININE: 0.58 mg/dL (ref 0.44–1.00)
Chloride: 106 mmol/L (ref 101–111)
GLUCOSE: 273 mg/dL — AB (ref 65–99)
Potassium: 4.3 mmol/L (ref 3.5–5.1)
Sodium: 134 mmol/L — ABNORMAL LOW (ref 135–145)

## 2017-12-28 LAB — CBC
HEMATOCRIT: 26.8 % — AB (ref 36.0–46.0)
Hemoglobin: 8.6 g/dL — ABNORMAL LOW (ref 12.0–15.0)
MCH: 24.8 pg — ABNORMAL LOW (ref 26.0–34.0)
MCHC: 32.1 g/dL (ref 30.0–36.0)
MCV: 77.2 fL — ABNORMAL LOW (ref 78.0–100.0)
PLATELETS: 410 10*3/uL — AB (ref 150–400)
RBC: 3.47 MIL/uL — ABNORMAL LOW (ref 3.87–5.11)
RDW: 15.4 % (ref 11.5–15.5)
WBC: 24.3 10*3/uL — AB (ref 4.0–10.5)

## 2017-12-28 LAB — MAGNESIUM: Magnesium: 1.7 mg/dL (ref 1.7–2.4)

## 2017-12-28 LAB — MRSA PCR SCREENING: MRSA BY PCR: NEGATIVE

## 2017-12-28 MED ORDER — TRAVASOL 10 % IV SOLN: INTRAVENOUS | Status: DC

## 2017-12-28 MED ORDER — SODIUM GLYCEROPHOSPHATE 1 MMOLE/ML IV SOLN
20.0000 mmol | Freq: Once | INTRAVENOUS | Status: AC
  Administered 2017-12-28: 20 mmol via INTRAVENOUS
  Filled 2017-12-28: qty 20

## 2017-12-28 MED ORDER — MAGNESIUM SULFATE 2 GM/50ML IV SOLN
2.0000 g | Freq: Once | INTRAVENOUS | Status: AC
  Administered 2017-12-28: 2 g via INTRAVENOUS
  Filled 2017-12-28: qty 50

## 2017-12-28 MED ORDER — TRAVASOL 10 % IV SOLN
INTRAVENOUS | Status: AC
  Administered 2017-12-28: 18:00:00 via INTRAVENOUS
  Filled 2017-12-28: qty 594

## 2017-12-28 NOTE — Progress Notes (Signed)
NGT observed in pt's bed again. 150 output noted in suction canister. MD Byerly notifed and orders obtained to keep NGT out until night shift and hold morning dose of Aspirin.

## 2017-12-28 NOTE — Progress Notes (Signed)
Abdomen xray confirmed correct placement of NG tube. Low wall suction resumed.

## 2017-12-28 NOTE — Progress Notes (Addendum)
1 Day Post-Op   Subjective/Chief Complaint: S/p takeback yesterday with large dilated loop of bowel seen with necrosis.     Objective: Vital signs in last 24 hours: Temp:  [97.2 F (36.2 C)-99.4 F (37.4 C)] 99.4 F (37.4 C) (01/11 0800) Pulse Rate:  [94-117] 117 (01/10 2000) Resp:  [17-31] 31 (01/10 2240) BP: (109-134)/(56-89) 110/66 (01/11 0409) SpO2:  [98 %-100 %] 100 % (01/10 2000) Weight:  [58 kg (127 lb 13.9 oz)] 58 kg (127 lb 13.9 oz) (01/11 0500) Last BM Date: 12/26/08  Intake/Output from previous day: 01/10 0701 - 01/11 0700 In: 4444.5 [I.V.:3314.5; Blood:630; IV INOMVEHMC:947] Out: 715 [Urine:215; Emesis/NG output:200; Blood:300] Intake/Output this shift: No intake/output data recorded.  General appearance: sleepy.   Resp: breathing normally GI: soft,  Sl distended.  Non tender.    Extremities: extremities normal, atraumatic, no cyanosis or edema  Lab Results:  Recent Labs    12/27/17 1643 12/28/17 0844  WBC  --  24.3*  HGB 10.9* 8.6*  HCT 32.0* 26.8*  PLT  --  410*   BMET Recent Labs    12/27/17 0440  12/27/17 1643 12/28/17 0844  NA 136   < > 136 134*  K 3.6   < > 4.4 4.3  CL 104  --   --  106  CO2 24  --   --  23  GLUCOSE 104*  --  152* 273*  BUN 10  --   --  12  CREATININE 0.48  --   --  0.58  CALCIUM 7.6*  --   --  7.5*   < > = values in this interval not displayed.   PT/INR No results for input(s): LABPROT, INR in the last 72 hours. ABG Recent Labs    12/27/17 1632  HCO3 21.7    Studies/Results: Dg Abd Portable 1v  Result Date: 12/28/2017 CLINICAL DATA:  Evaluate NG tube placement EXAM: PORTABLE ABDOMEN - 1 VIEW COMPARISON:  None. FINDINGS: The distal tip of the NG tube is in the right side of the abdomen, likely in the distal antrum or pyloric region. The side port is in the distal body of the stomach. No other acute abnormalities. IMPRESSION: NG tube placement as above. Electronically Signed   By: Dorise Bullion III M.D   On:  12/28/2017 09:41    Anti-infectives: Anti-infectives (From admission, onward)   None      Assessment/Plan: S/p lap asst SBR for obstructing neuroendocrine tumor around 3 weeks ago. ABL anemia and anemia of chronic disease.  POD 1 takeback with small bowel resection x 2.  Decreased mental status.  Not unexpected. NPO NGT TNA. Plan prolonged NPO status. Wet to dry dressing changes.      LOS: 4 days    Stark Klein 12/28/2017

## 2017-12-28 NOTE — Anesthesia Postprocedure Evaluation (Signed)
Anesthesia Post Note  Patient: Theresa Howe  Procedure(s) Performed: DIAGNOSTIC LAPAROSCOPY/ LAPAROTOMY WITH SMALL BOWEL RESECTION TIMES TWO (N/A Abdomen)     Patient location during evaluation: PACU Anesthesia Type: General Level of consciousness: awake and alert Pain management: pain level controlled Vital Signs Assessment: post-procedure vital signs reviewed and stable Respiratory status: spontaneous breathing, nonlabored ventilation and respiratory function stable Cardiovascular status: blood pressure returned to baseline and stable Postop Assessment: no apparent nausea or vomiting Anesthetic complications: no    Last Vitals:  Vitals:   12/28/17 0409 12/28/17 0800  BP: 110/66   Pulse:    Resp:    Temp: 37.1 C 37.4 C  SpO2:      Last Pain:  Vitals:   12/28/17 0800  TempSrc: Oral  PainSc:                  Catalina Gravel

## 2017-12-28 NOTE — Progress Notes (Signed)
NG tube observed in the bed. NG tube placed with assistance from [place]. RN. Pt vomited green substance x1. No other incident noted. SpO2 99% on room air.  Stat chest xray ordered per protocol for correct placement. NG currently clamped.

## 2017-12-28 NOTE — NC FL2 (Signed)
[place] MEDICAID FL2 LEVEL OF CARE SCREENING TOOL     IDENTIFICATION  Patient Name: Theresa Howe Birthdate: 12-07-1931 Sex: female Admission Date (Current Location): 12/24/2017  [facility] and [place] Number:  Producer, television/film/video and Address:  [facility], [address] [place], [place] [address]      Provider Number: [id number]  Attending Physician Name and Address:  Almond Lint, MD  Relative Name and Phone Number:  Nicholes Stairs, [phone]    Current Level of Care: Hospital Recommended Level of Care: Skilled Nursing Facility Prior Approval Number:    Date Approved/Denied:   PASRR Number: [id number]  Discharge Plan: SNF    Current Diagnoses: Patient Active Problem List   Diagnosis Date Noted  . Failure to thrive (0-17) 12/24/2017  . Protein-calorie malnutrition, severe 12/06/2017  . Small bowel mass 12/04/2017  . Protein-calorie malnutrition (HCC) 09/11/2017  . Weight loss 09/11/2017  . Diabetic neuropathy (HCC) 09/28/2015  . Hip pain 09/21/2014  . Muscle ache of extremity 06/12/2014  . Anxiety state 10/06/2013  . Bladder prolapse, female, acquired 08/03/2013  . Osteoporosis, unspecified 08/03/2013  . Nonspecific (abnormal) findings on radiological and other examination of biliary tract 04/17/2013  . Diabetes with neurologic complications (HCC) 09/18/2011  . Hypertension associated with diabetes (HCC) 09/18/2011  . Hyperlipidemia LDL goal <70 09/18/2011  . Glaucoma 09/18/2011    Orientation RESPIRATION BLADDER Height & Weight     Self, Place  Normal External catheter Weight: 127 lb 13.9 oz (58 kg) Height:  5\' 5"  (165.1 cm)(12/04/17)  BEHAVIORAL SYMPTOMS/MOOD NEUROLOGICAL BOWEL NUTRITION STATUS      Incontinent TNA, NG/panda  AMBULATORY STATUS COMMUNICATION OF NEEDS Skin   Extensive Assist Verbally PU Stage and Appropriate Care, Surgical wounds(abdomen)   PU Stage 2 Dressing: (coccyx & buttocks)                    Personal Care Assistance Level of Assistance  Bathing, Feeding, Dressing Bathing Assistance: Maximum assistance Feeding assistance: Maximum assistance(TPN) Dressing Assistance: Maximum assistance     Functional Limitations Info  Sight, Hearing, Speech Sight Info: Adequate Hearing Info: Adequate Speech Info: Adequate    SPECIAL CARE FACTORS FREQUENCY  PT (By licensed PT), OT (By licensed OT)     PT Frequency: 5x week OT Frequency: 5x week            Contractures Contractures Info: Not present    Additional Factors Info  Code Status, Allergies Code Status Info: Full Code Allergies Info: NKA   Insulin Sliding Scale Info: 3/day       Current Medications (12/28/2017):  This is the current hospital active medication list Current Facility-Administered Medications  Medication Dose Route Frequency Provider Last Rate Last Dose  . 0.9 %  sodium chloride infusion   Intravenous Once Almond Lint, MD      . 0.9 %  sodium chloride infusion  10 mL/hr Intravenous Once Burt Ek, CRNA      . acetaminophen (TYLENOL) tablet 650 mg  650 mg Oral Q6H PRN Almond Lint, MD       Or  . acetaminophen (TYLENOL) suppository 650 mg  650 mg Rectal Q6H PRN Almond Lint, MD      . albuterol (PROVENTIL) (2.5 MG/3ML) 0.083% nebulizer solution 3 mL  3 mL Inhalation Q6H PRN Almond Lint, MD      . aspirin chewable tablet 81 mg  81 mg Oral Daily Almond Lint, MD   Stopped at 12/28/17 1226  .  bupivacaine liposome (EXPAREL) 1.3 % injection 266 mg  20 mL Infiltration Once Almond Lint, MD      . collagenase (SANTYL) ointment 1 application  1 application Topical Daily Almond Lint, MD   1 application at 12/27/17 1043  . diphenhydrAMINE (BENADRYL) 12.5 MG/5ML elixir 12.5 mg  12.5 mg Oral Q6H PRN Almond Lint, MD       Or  . diphenhydrAMINE (BENADRYL) injection 12.5 mg  12.5 mg Intravenous Q6H PRN Almond Lint, MD      . dorzolamide (TRUSOPT) 2 % ophthalmic solution 1 drop  1 drop  Both Eyes BID Almond Lint, MD   1 drop at 12/28/17 1100  . escitalopram (LEXAPRO) tablet 5 mg  5 mg Oral QHS Almond Lint, MD   5 mg at 12/27/17 2210  . feeding supplement (BOOST / RESOURCE BREEZE) liquid 1 Container  1 Container Oral TID BM Almond Lint, MD   Stopped at 12/28/17 1100  . fluticasone (FLONASE) 50 MCG/ACT nasal spray 2 spray  2 spray Each Nare Daily Almond Lint, MD   2 spray at 12/28/17 1100  . gabapentin (NEURONTIN) capsule 100 mg  100 mg Oral QHS Almond Lint, MD   100 mg at 12/27/17 2210  . hydrALAZINE (APRESOLINE) injection 10 mg  10 mg Intravenous Q2H PRN Almond Lint, MD      . insulin aspart (novoLOG) injection 0-15 Units  0-15 Units Subcutaneous Q4H Armandina Stammer, RPH   3 Units at 12/28/17 1238  . lactated ringers infusion   Intravenous Continuous Ann Held, RPH 55 mL/hr at 12/28/17 0543    . latanoprost (XALATAN) 0.005 % ophthalmic solution 1 drop  1 drop Both Eyes QHS Almond Lint, MD   1 drop at 12/28/17 0059  . magnesium sulfate IVPB 2 g 50 mL  2 g Intravenous Once Ann Held, [facility]      . mirtazapine (REMERON) tablet 7.5 mg  7.5 mg Oral QHS Almond Lint, MD   7.5 mg at 12/27/17 2210  . morphine 4 MG/ML injection 1-2 mg  1-2 mg Intravenous Q2H PRN Almond Lint, MD      . ondansetron (ZOFRAN-ODT) disintegrating tablet 4 mg  4 mg Oral Q6H PRN Almond Lint, MD       Or  . ondansetron (ZOFRAN) injection 4 mg  4 mg Intravenous Q6H PRN Almond Lint, MD   4 mg at 12/27/17 2246  . oxyCODONE (ROXICODONE) 5 MG/5ML solution 5 mg  5 mg Oral Q6H PRN Almond Lint, MD   5 mg at 12/27/17 2246  . pantoprazole (PROTONIX) injection 40 mg  40 mg Intravenous QHS Almond Lint, MD   40 mg at 12/27/17 2210  . prochlorperazine (COMPAZINE) tablet 10 mg  10 mg Oral Q6H PRN Almond Lint, MD       Or  . prochlorperazine (COMPAZINE) injection 5-10 mg  5-10 mg Intravenous Q6H PRN Almond Lint, MD      . sodium chloride flush (NS) 0.9 % injection 10-40 mL   10-40 mL Intracatheter PRN Almond Lint, MD      . sodium glycerophosphate (GLYCOPHOS) 20 mmol in sodium chloride 0.9 % 250 mL infusion  20 mmol Intravenous Once Ann Held, [facility]      . TPN ADULT (ION)   Intravenous Continuous TPN Ann Held, [facility] 45 mL/hr at 12/27/17 2019    . TPN ADULT (ION)   Intravenous Continuous TPN Ann Held, [facility]         Discharge Medications: Please  see discharge summary for a list of discharge medications.  Relevant Imaging Results:  Relevant Lab Results:   Additional Information SS# [id number] [place], [place]

## 2017-12-28 NOTE — Progress Notes (Signed)
[place] NOTE   Pharmacy Consult for TPN Indication: Intolerance to enteral feeding  Patient Measurements: Height: 5\' 5"  (165.1 cm)(12/04/17) Weight: 127 lb 13.9 oz (58 kg) IBW/kg (Calculated) : 57 TPN AdjBW (KG): 57.6 Body mass index is 21.28 kg/m.  Assessment:  84 year old female with stage IV neuroendocrine small bowel cancer status post laparoscopic assisted small bowel resection in December who was re-admitted from SNF after she began having nausea and vomiting and unable to tolerate enteral feeding.   GI: s/p SBR in December, went back to OR 1/10 for ex-lap which found a large dilated segment of bowel with bezoar - s/p SBR x 2. NGT tube placed - 200 cc output thus far.. Albumin 1.6. Pre-albumin low 7.6. LBM 1/9. Pt taking only some supplements, will f/u on plans to retry TFs Endo: Hx of DM on Metformin pta. CBGs 153-275, jump up likely due to decadron 10 mg given yesterday at 1500. Will increase insulin in TPN but not aggressively given that the effects of decadron will wear off Insulin requirements in the past 12 hours: 16 units, 35 units insulin R in TPN Lytes:  K 4.3 (trending up, will decrease slightly in TPN), Phos 2.4 << 2.9 (will incrase in TPN), Mg 1.7 (Mg 1g IV not given yesterday, max amount already in TPN). Na 134 (slightly low), Cl wnl, CoCa 9.4, CO3 23 and trending down, will increase Cl:Ac ratio to 1:2. On LR at 55 mL/hr.  Renal: SCr stable, BUN 10. UOP not all charted Pulm: RA Cards: BP soft Hepatobil: LFTs wnl Neuro: ID: WBC up 24.3, Afebrile  TPN Access: PICC 12/24/17 TPN start date: 12/25/17 Nutritional Goals (per RD recommendation on 1/10): KCal: 1500-1700 Protein: 75-90 grams Fluid: >1.5L  Goal TPN rate is 60 ml/hr (provides 100% needs)  Current Nutrition: Clear liquid diet Boost 1 TIDWC (1 can = 240 kcal, 10g protein, 4g Fat, 41g Carbs) received 1 can yesterday  Plan:  Continue TPN at 45 mL/hr until CBGs better  controlled This TPN provides 59 g of protein, 162 g of dextrose, and 27g of lipids which provides 1058 kCals per day, meeting ~70% of patient needs. Will titrate to goal as tolerated. Electrolytes in TPN: will reduce K slightly, continue with max Mg in TPN, increase Na, increase Phos, increase ratio to Cl:Ac 1:2 Add MVI and trace elements, and increase to 45 units of regular insulin in TPN Continue Boost TID per MD Continue moderate SSI to Q4h and adjust as needed Continue with LR at 55 ml/hr - will decrease further as TPN rate increased Monitor TPN labs tomorrow F/U toleration of Boost and clear liquids, OR on 1/10  Magnesium 2 g IV x1 today Glycophos 20 mmol x 1 today  Thank you for allowing pharmacy to be a part of this patient's care.  Alycia Rossetti, PharmD, BCPS Clinical Pharmacist Pager: [phone] Clinical phone for 12/28/2017 from 7a-3:30p: [phone] If after 3:30p, please call main pharmacy at: [phone] 12/28/2017 8:01 AM

## 2017-12-28 NOTE — Care Management Important Message (Signed)
Important Message  Patient Details  Name: Theresa Howe MRN: [id number] Date of Birth: 04-05-1931   Medicare Important Message Given:  Yes    Ella Bodo, RN 12/28/2017, 2:51 PM

## 2017-12-29 ENCOUNTER — Inpatient Hospital Stay (HOSPITAL_COMMUNITY): Payer: Medicare Other

## 2017-12-29 LAB — BASIC METABOLIC PANEL
ANION GAP: 11 (ref 5–15)
BUN: 12 mg/dL (ref 6–20)
CALCIUM: 7.5 mg/dL — AB (ref 8.9–10.3)
CO2: 22 mmol/L (ref 22–32)
CREATININE: 0.51 mg/dL (ref 0.44–1.00)
Chloride: 105 mmol/L (ref 101–111)
GFR calc Af Amer: >60 mL/min (ref >60)
GLUCOSE: 60 mg/dL — AB (ref 65–99)
Potassium: 4.2 mmol/L (ref 3.5–5.1)
Sodium: 138 mmol/L (ref 135–145)

## 2017-12-29 LAB — GLUCOSE, CAPILLARY
GLUCOSE-CAPILLARY: 97 mg/dL (ref 65–99)
GLUCOSE-CAPILLARY: 186 mg/dL — AB (ref 65–99)
GLUCOSE-CAPILLARY: 234 mg/dL — AB (ref 65–99)
Glucose-Capillary: 124 mg/dL — ABNORMAL HIGH (ref 65–99)
Glucose-Capillary: 134 mg/dL — ABNORMAL HIGH (ref 65–99)
Glucose-Capillary: 270 mg/dL — ABNORMAL HIGH (ref 65–99)

## 2017-12-29 LAB — CBC
HEMATOCRIT: 20.8 % — AB (ref 36.0–46.0)
Hemoglobin: 7 g/dL — ABNORMAL LOW (ref 12.0–15.0)
MCH: 25.9 pg — ABNORMAL LOW (ref 26.0–34.0)
MCHC: 33.7 g/dL (ref 30.0–36.0)
MCV: 77 fL — AB (ref 78.0–100.0)
PLATELETS: 403 10*3/uL — AB (ref 150–400)
RBC: 2.7 MIL/uL — ABNORMAL LOW (ref 3.87–5.11)
RDW: 16.1 % — AB (ref 11.5–15.5)
WBC: 23.3 10*3/uL — AB (ref 4.0–10.5)

## 2017-12-29 LAB — MAGNESIUM: Magnesium: 2.1 mg/dL (ref 1.7–2.4)

## 2017-12-29 LAB — PHOSPHORUS: Phosphorus: 3.8 mg/dL (ref 2.5–4.6)

## 2017-12-29 MED ORDER — LACTATED RINGERS IV SOLN
INTRAVENOUS | Status: DC
  Administered 2017-12-29 – 2018-01-14 (×6): via INTRAVENOUS

## 2017-12-29 MED ORDER — TRAVASOL 10 % IV SOLN
INTRAVENOUS | Status: AC
  Administered 2017-12-29: 17:00:00 via INTRAVENOUS
  Filled 2017-12-29: qty 864

## 2017-12-29 NOTE — Progress Notes (Signed)
On assessment @1900  patient had NG tube laying by her side. MD said do not replace.

## 2017-12-29 NOTE — Progress Notes (Signed)
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 Barry Dienes who returned on 01/07 with partial SBO  Partial SBO - S/P diagnostic laparoscopy, exploratory laparotomy, SBR x 2, 01/10, Dr. Barry Dienes - Pt pulled NGT out yesterday, replaced last night with 154mL of emesis during placement, since reinsertion scant clear output, no BM  FEN: NGT, TPN, NPO VTE: SCD's ID: none currently, WBC was 24.3 yesterday, AM labs pending, afebrile  Foley: yes Follow up: Dr. Barry Dienes  DISPO: NGT, TPN, await return of bowel function, am labs pending    LOS: 5 days    Subjective:  CC: abdominal pain  Pt states mild pain. No nausea. She is unsure if she has had flatus. She denies CP, SOB other pain. No family at bedside.   Objective: Vital signs in last 24 hours: Temp:  [98.1 F (36.7 C)-99.6 F (37.6 C)] 98.6 F (37 C) (01/12 0815) Pulse Rate:  [69-120] 115 (01/12 0815) Resp:  [21-28] 25 (01/12 0815) BP: (107-127)/(54-94) 127/61 (01/12 0815) SpO2:  [97 %-100 %] 98 % (01/12 0815) Last BM Date: 12/26/17  Intake/Output from previous day: 01/11 0701 - 01/12 0700 In: 1728 [I.V.:1728] Out: 425 [Urine:275; Emesis/NG output:150] Intake/Output this shift: No intake/output data recorded.  PE: Gen:  Alert, NAD, pleasant, cooperative Card:  Mild tachycardia HEENT: NGT in place with scant non bilious output in canister Pulm:  CTA anteriorly, no W/R/R, rate and effort normal Abd: Soft, not distended, +BS, midline incision without purulent drainage, repacked wet to dry, generalized TTP without guarding Skin: no rashes noted, warm and dry   Anti-infectives: Anti-infectives (From admission, onward)   None      Lab Results:  Recent Labs     12/27/17 1643 12/28/17 0844  WBC  --  24.3*  HGB 10.9* 8.6*  HCT 32.0* 26.8*  PLT  --  410*   BMET Recent Labs    12/27/17 0440  12/27/17 1643 12/28/17 0844  NA 136   < > 136 134*  K 3.6   < > 4.4 4.3  CL 104  --   --  106  CO2 24  --   --  23  GLUCOSE 104*  --  152* 273*  BUN 10  --   --  12  CREATININE 0.48  --   --  0.58  CALCIUM 7.6*  --   --  7.5*   < > = values in this interval not displayed.   PT/INR No results for input(s): LABPROT, INR in the last 72 hours. CMP     Component Value Date/Time   NA 134 (L) 12/28/2017 0844   K 4.3 12/28/2017 0844   CL 106 12/28/2017 0844   CO2 23 12/28/2017 0844   GLUCOSE 273 (H) 12/28/2017 0844   BUN 12 12/28/2017 0844   CREATININE 0.58 12/28/2017 0844   CREATININE 0.97 (H) 10/05/2017 1400   CALCIUM 7.5 (L) 12/28/2017 0844   PROT 4.5 (L) 12/27/2017 0440   ALBUMIN 1.6 (L) 12/27/2017 0440   AST 21 12/27/2017 0440   ALT 12 (L) 12/27/2017 0440   ALKPHOS 88 12/27/2017 0440   BILITOT 0.4 12/27/2017 0440   GFRNONAA >60 12/28/2017 0844   GFRNONAA 54 (L) 10/05/2017 1400   GFRAA >60 12/28/2017 0844   GFRAA 62 10/05/2017 1400  Lipase     Component Value Date/Time   LIPASE 54 10/05/2017 1400    Studies/Results: Dg Abd Portable 1v  Result Date: 12/29/2017 CLINICAL DATA:  NG tube placement EXAM: PORTABLE ABDOMEN - 1 VIEW COMPARISON:  12/28/2017, CT 12/24/2016 FINDINGS: Scoliosis and degenerative changes of the spine. Esophageal tube tip and side port overlie the proximal to mid stomach. Postsurgical changes in the left upper and right lower quadrants. Possible dilation of small bowel in the left lower quadrant but with colon gas present. Linear lucency along the left flank, suspect that this may be artifactual and related to technique IMPRESSION: Esophageal tube tip and side port overlie the proximal to mid stomach Electronically Signed   By: Donavan Foil M.D.   On: 12/29/2017 00:51   Dg Abd Portable 1v  Result Date:  12/28/2017 CLINICAL DATA:  Evaluate NG tube placement EXAM: PORTABLE ABDOMEN - 1 VIEW COMPARISON:  None. FINDINGS: The distal tip of the NG tube is in the right side of the abdomen, likely in the distal antrum or pyloric region. The side port is in the distal body of the stomach. No other acute abnormalities. IMPRESSION: NG tube placement as above. Electronically Signed   By: Dorise Bullion III M.D   On: 12/28/2017 09:41      [name] , [facility] Surgery 12/29/2017, 9:32 AM Pager: [phone] Consults: [phone] Mon-Fri 7:00 am-4:30 pm Sat-Sun 7:00 am-11:30 am

## 2017-12-29 NOTE — Progress Notes (Signed)
CSW spoke with Santiago Glad at [facility] to determine if patient could return with NG tube, Santiago Glad stated that she could but only if it would be needed for a short period of time.   CSW spoke with patient's RN, Crystal to discuss patient returning to [facility]. Patient has NG tube that will be removed tomorrow, but no discharge discussion has occurred yet.   CSW informed Santiago Glad at [facility] of update on patient.  CSW to continue following.  Madilyn Fireman, MSW, LCSW-A Weekend Clinical Social Worker [phone]

## 2017-12-29 NOTE — Progress Notes (Signed)
[place] NOTE   Pharmacy Consult for TPN Indication: Intolerance to enteral feeding  Patient Measurements: Height: 5\' 5"  (165.1 cm)(12/04/17) Weight: 127 lb 13.9 oz (58 kg) IBW/kg (Calculated) : 57 TPN AdjBW (KG): 57.6 Body mass index is 21.28 kg/m.  Assessment:  84 year old female with stage IV neuroendocrine small bowel cancer s/p laparoscopic assisted small bowel resection in December who was re-admitted from SNF after she began having nausea and vomiting and unable to tolerate enteral feeding.   GI: s/p SBR in December, went back to OR 1/10 for ex-lap which found a large dilated segment of bowel with bezoar and necrosis - s/p SBR x 2. NGT tube placed - 150cc output down slightly. Albumin 1.6. Pre-albumin low 7.6. LBM 1/10. Pt taking only some supplements, will f/u on plans to retry TFs. No po intake documented except Boost x 1 this AM. - IV PPI  Endo: Hx of DM on Metformin pta. CBGs 268 (after decadron) now down to 97 this AM.  Insulin requirements in the past 12 hours: 21 units, 35 units insulin R in TPN  Lytes:  Phos 3.8 replaced.  Mg 2.1 improved. CoCa 9.4  Renal: SCr stable, BUN 10.  Pulm: RA Cards: BP ok, HR 115 on ASA81 Hepatobil: LFTs wnl Neuro:Lexapro, Gabapentin, Remeron ID: WBC up 23.3, Afebrile. No abx IVF: LR at 56ml/hr  TPN Access: PICC 12/24/17 TPN start date: 12/25/17 Nutritional Goals (per RD recommendation on 1/10): KCal: 1500-1700 Protein: 75-90 grams Fluid: >1.5L  Goal TPN rate is 60 ml/hr (provides 100% needs)  Current Nutrition: Clear liquid diet Boost 1 TIDWC (1 can = 240 kcal, 10g protein, 4g Fat, 41g Carbs)   Plan:  Increase TPN to goal 38ml/hr - This TPN provides 86 g of protein, 216 g of dextrose, and 36g of lipids which provides 1440 kCals per day,  Electrolytes in TPN: normalize  Add MVI and trace elements  Decrease insulin in TPN with lower CBGs  Continue Boost TID per MD  Continue  moderate SSI to Q4h and adjust as needed  Decrease with LR to 40 ml/hr   Monitor TPN labs Mon/Thurs  F/U toleration of Boost and clear liquids   Letasha Kershaw S. Alford Highland, PharmD, BCPS Clinical Staff Pharmacist Pager [phone]  Clinical phone for 12/29/2017 from 7a-3:30p: [phone] If after 3:30p, please call main pharmacy at: [phone] 12/29/2017 10:39 AM

## 2017-12-29 NOTE — Progress Notes (Signed)
New NG tube placed with assistance from [place]. RN. Pt vomited 150 mL of green/yellow emesis with some speckles of blood during placement. Patient now resting comfortably and I encouraged patient not to pull at new NG Tube. Stat chest X-Ray confirmed placement and set on low intermittent suctioning.

## 2017-12-30 LAB — GLUCOSE, CAPILLARY
GLUCOSE-CAPILLARY: 154 mg/dL — AB (ref 65–99)
GLUCOSE-CAPILLARY: 148 mg/dL — AB (ref 65–99)
Glucose-Capillary: 150 mg/dL — ABNORMAL HIGH (ref 65–99)
Glucose-Capillary: 156 mg/dL — ABNORMAL HIGH (ref 65–99)
Glucose-Capillary: 170 mg/dL — ABNORMAL HIGH (ref 65–99)
Glucose-Capillary: 100 mg/dL — ABNORMAL HIGH (ref 65–99)

## 2017-12-30 LAB — BASIC METABOLIC PANEL
ANION GAP: 9 (ref 5–15)
BUN: 12 mg/dL (ref 6–20)
CALCIUM: 7.7 mg/dL — AB (ref 8.9–10.3)
CO2: 21 mmol/L — ABNORMAL LOW (ref 22–32)
CREATININE: 0.53 mg/dL (ref 0.44–1.00)
Chloride: 106 mmol/L (ref 101–111)
Glucose, Bld: 164 mg/dL — ABNORMAL HIGH (ref 65–99)
Potassium: 3.7 mmol/L (ref 3.5–5.1)
SODIUM: 136 mmol/L (ref 135–145)

## 2017-12-30 LAB — PREPARE RBC (CROSSMATCH)

## 2017-12-30 MED ORDER — CHLORHEXIDINE GLUCONATE CLOTH 2 % EX PADS
6.0000 | MEDICATED_PAD | Freq: Every day | CUTANEOUS | Status: DC
  Administered 2017-12-30 – 2018-01-06 (×8): 6 via TOPICAL

## 2017-12-30 MED ORDER — SODIUM CHLORIDE 0.9 % IV SOLN
Freq: Once | INTRAVENOUS | Status: AC
  Administered 2017-12-30: 10:00:00 via INTRAVENOUS

## 2017-12-30 MED ORDER — TRAVASOL 10 % IV SOLN
INTRAVENOUS | Status: AC
  Administered 2017-12-30: 17:00:00 via INTRAVENOUS
  Filled 2017-12-30: qty 864

## 2017-12-30 NOTE — Evaluation (Signed)
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 PMHx: DM, glaucoma, HTN, neuropathy  Clinical Impression  Pt pleasant and states she is willing to mobilize and that she walks 4x/wk at [facility]. Pt currently with Hgb 6.6 receiving blood and unable to mobilize OOB. Pt with decreased strength, transfers, mobility and gait who will benefit from acute therapy to maximize mobility, function, strength and gait to decrease burden of care.  .     Follow Up Recommendations SNF;Supervision/Assistance - 24 hour    Equipment Recommendations  Rolling walker with 5" wheels;3in1 (PT)    Recommendations for Other Services       Precautions / Restrictions Precautions Precautions: Fall Precaution Comments: Abdominial incision, sacral wound      Mobility  Bed Mobility Overal bed mobility: Needs Assistance Bed Mobility: Rolling;Sidelying to Sit Rolling: Min guard Sidelying to sit: Min assist          Transfers Overall transfer level: Needs assistance   Transfers: Sit to/from Stand Sit to Stand: Min assist         General transfer comment: min assist to stand from bed x2. unable to transfer or walk due to Hgb  Ambulation/Gait                Stairs            Wheelchair Mobility    Modified Rankin (Stroke Patients Only)       Balance Overall balance assessment: Needs assistance   Sitting balance-Leahy Scale: Good       Standing balance-Leahy Scale: Poor                               Pertinent Vitals/Pain Pain Assessment: No/denies pain    Home Living Family/patient expects to be discharged to:: Skilled nursing facility                      Prior Function Level of Independence: Needs assistance   Gait /  Transfers Assistance Needed: walks limited distance with assist of staff at SNF  ADL's / Homemaking Assistance Needed: help for bathing and dressing  Comments: was living alone and independent 3 weeks agio     Hand Dominance        Extremity/Trunk Assessment   Upper Extremity Assessment Upper Extremity Assessment: Generalized weakness    Lower Extremity Assessment Lower Extremity Assessment: Generalized weakness    Cervical / Trunk Assessment Cervical / Trunk Assessment: Kyphotic  Communication   Communication: No difficulties  Cognition Arousal/Alertness: Awake/alert Behavior During Therapy: WFL for tasks assessed/performed Overall Cognitive Status: Impaired/Different from baseline Area of Impairment: Memory                     Memory: Decreased short-term memory Following Commands: Follows one step commands consistently     Problem Solving: Slow processing        General Comments      Exercises     Assessment/Plan    PT Assessment Patient needs continued PT services  PT Problem List Decreased strength;Decreased activity tolerance;Decreased balance;Decreased mobility;Decreased knowledge of use of DME;Decreased cognition;Decreased safety awareness       PT Treatment Interventions DME instruction;Gait training;Stair training;Functional mobility training;Therapeutic activities;Therapeutic exercise;Neuromuscular  re-education;Patient/family education    PT Goals (Current goals can be found in the Care Plan section)  Acute Rehab PT Goals Patient Stated Goal: walk PT Goal Formulation: With patient Time For Goal Achievement: 01/13/18 Potential to Achieve Goals: Good    Frequency Min 2X/week   Barriers to discharge Decreased caregiver support      Co-evaluation               AM-PAC PT "6 Clicks" Daily Activity  Outcome Measure Difficulty turning over in bed (including adjusting bedclothes, sheets and blankets)?: Unable Difficulty moving  from lying on back to sitting on the side of the bed? : Unable Difficulty sitting down on and standing up from a chair with arms (e.g., wheelchair, bedside commode, etc,.)?: Unable Help needed moving to and from a bed to chair (including a wheelchair)?: A Little Help needed walking in hospital room?: A Little Help needed climbing 3-5 steps with a railing? : A Lot 6 Click Score: 11    End of Session   Activity Tolerance: Patient tolerated treatment well Patient left: in bed;with call bell/phone within reach;with bed alarm set Nurse Communication: Mobility status PT Visit Diagnosis: Other abnormalities of gait and mobility (R26.89);Muscle weakness (generalized) (M62.81)    Time: 4166-0630 PT Time Calculation (min) (ACUTE ONLY): 13 min   Charges:   PT Evaluation $PT Eval Moderate Complexity: 1 Mod     PT G Codes:        Delaney Meigs, PT [phone]   Tammie Yanda B Dandrae Kustra 12/30/2017, 12:11 PM

## 2017-12-30 NOTE — Progress Notes (Signed)
[place] NOTE   Pharmacy Consult for TPN Indication: Intolerance to enteral feeding  Patient Measurements: Height: 5\' 5"  (165.1 cm)(12/04/17) Weight: 127 lb 13.9 oz (58 kg) IBW/kg (Calculated) : 57 TPN AdjBW (KG): 57.6 Body mass index is 21.28 kg/m.  Assessment:  84 year old female with stage IV neuroendocrine small bowel cancer s/p laparoscopic assisted small bowel resection in December who was re-admitted from SNF after she began having nausea and vomiting and unable to tolerate enteral feeding.   GI: s/p SBR in December, went back to OR 1/10 for ex-lap which found a large dilated segment of bowel with bezoar and necrosis - s/p SBR x 2. Albumin 1.6. Pre-albumin low 7.6. LBM 1/9. Pt taking only some supplements, will f/u on plans to retry TFs. No po intake documented except Boost supplement x 2 on 1/12. NGT out (keeps coming out) - IV PPI  Endo: Hx of DM on Metformin pta. CBGs 97-270 all over the place Insulin requirements in the past 12 hours: 20 units last 24 hrs, 30 units insulin R in TPN  Lytes:  Phos 3.7.  Mg 2.1 improved. CoCa 9.62  Renal: SCr stable, BUN 12. I/O +8L total Pulm: RA Cards: BP ok, HR 107 (107-124) on ASA81 Hepatobil: LFTs wnl Neuro:Lexapro, Gabapentin, Remeron ID: WBC remain elevated, Afebrile. No abx IVF: LR at 49ml/hr  TPN Access: PICC 12/24/17 TPN start date: 12/25/17 Nutritional Goals (per RD recommendation on 1/10): KCal: 1500-1700 Protein: 75-90 grams Fluid: >1.5L  Goal TPN rate is 60 ml/hr (provides 100% needs)  Current Nutrition: Clear liquid diet Boost 1 TIDWC (1 can = 240 kcal, 10g protein, 4g Fat, 41g Carbs)   Plan:  Increase TPN to goal 59ml/hr - This TPN provides 86 g of protein, 216 g of dextrose, and 36g of lipids which provides 1440 kCals per day,  Electrolytes in TPN: normalize  Add MVI and trace elements  Increase insulin back in TPN to 45 units/L  Continue Boost TID per  MD  Continue moderate SSI to Q4h and adjust as needed  Decrease with LR to 40 ml/hr   Monitor TPN labs Mon/Thurs  F/U toleration of Boost and clear liquids   Taydem Cavagnaro S. Alford Highland, PharmD, BCPS Clinical Staff Pharmacist Pager [phone]  Clinical phone for 12/30/2017 from 7a-3:30p: [phone] If after 3:30p, please call main pharmacy at: [phone] 12/30/2017 8:34 AM

## 2017-12-30 NOTE — Progress Notes (Signed)
CSW spoke with patient's RN to discuss discharge. Patient recently removed her on NG tube and it will not be replaced. CSW informed RN that patient has bed at [facility] and can return whenever medically stable. CSW asked RN to please reach out if a discharge plan is discussed.   Madilyn Fireman, MSW, LCSW-A Weekend Clinical Social Worker [phone]

## 2017-12-30 NOTE — Progress Notes (Addendum)
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 Barry Dienes who returned on 01/07 with partial SBO  Partial SBO - S/P diagnostic laparoscopy, exploratory laparotomy, SBR x 2, 01/10, Dr. Barry Dienes - NG repeatedly pulled by patient, left out, no BM or flatus yet  FEN: TPN, NPO VTE: SCD's ID: none currently, WBC downtrending,  afebrile  Foley: wick Follow up: Dr. Barry Dienes  DISPO: NPO, TPN, await return of bowel function. Transfuse this AM for hgb 6.6    LOS: 6 days    Subjective:  CC: abdominal pain  Denies pain this AM   Objective: Vital signs in last 24 hours: Temp:  [98.8 F (37.1 C)-100.3 F (37.9 C)] 99 F (37.2 C) (01/13 0800) Pulse Rate:  [107-124] 107 (01/13 0305) Resp:  [23-31] 25 (01/12 2336) BP: (101-139)/(48-64) 105/51 (01/13 0305) SpO2:  [97 %-100 %] 100 % (01/13 0305) Last BM Date: 12/26/17  Intake/Output from previous day: 01/12 0701 - 01/13 0700 In: 300 [NG/GT:300] Out: 1000 [Urine:900; Emesis/NG output:100] Intake/Output this shift: No intake/output data recorded.  PE: Gen:  Alert, NAD, pleasant, cooperative Card:  Mild tachycardia HEENT: NGT in place with scant non bilious output in canister Pulm:  CTA anteriorly, no W/R/R, rate and effort normal Abd: Soft, mildly distended, +BS, midline incision without purulent drainage, generalized mild TTP without guarding Skin: no rashes noted, warm and dry   Anti-infectives: Anti-infectives (From admission, onward)   None      Lab Results:  Recent Labs    12/29/17 0500 12/30/17 0500  WBC 23.3* 21.8*  HGB 7.0* 6.6*  HCT 20.8* 20.0*  PLT 403* 431*   BMET Recent Labs    12/29/17 0500 12/30/17 0500  NA 138 136  K 4.2 3.7  CL 105 106  CO2 22  21*  GLUCOSE 60* 164*  BUN 12 12  CREATININE 0.51 0.53  CALCIUM 7.5* 7.7*   PT/INR No results for input(s): LABPROT, INR in the last 72 hours. CMP     Component Value Date/Time   NA 136 12/30/2017 0500   K 3.7 12/30/2017 0500   CL 106 12/30/2017 0500   CO2 21 (L) 12/30/2017 0500   GLUCOSE 164 (H) 12/30/2017 0500   BUN 12 12/30/2017 0500   CREATININE 0.53 12/30/2017 0500   CREATININE 0.97 (H) 10/05/2017 1400   CALCIUM 7.7 (L) 12/30/2017 0500   PROT 4.5 (L) 12/27/2017 0440   ALBUMIN 1.6 (L) 12/27/2017 0440   AST 21 12/27/2017 0440   ALT 12 (L) 12/27/2017 0440   ALKPHOS 88 12/27/2017 0440   BILITOT 0.4 12/27/2017 0440   GFRNONAA >60 12/30/2017 0500   GFRNONAA 54 (L) 10/05/2017 1400   GFRAA >60 12/30/2017 0500   GFRAA 62 10/05/2017 1400   Lipase     Component Value Date/Time   LIPASE 54 10/05/2017 1400    Studies/Results: Dg Abd Portable 1v  Result Date: 12/29/2017 CLINICAL DATA:  NG tube placement EXAM: PORTABLE ABDOMEN - 1 VIEW COMPARISON:  12/28/2017, CT 12/24/2016 FINDINGS: Scoliosis and degenerative changes of the spine. Esophageal tube tip and side port overlie the proximal to mid stomach. Postsurgical changes in the left upper and right lower quadrants. Possible dilation of small bowel in the left lower quadrant but with colon gas present. Linear lucency along the left flank, suspect that  this may be artifactual and related to technique IMPRESSION: Esophageal tube tip and side port overlie the proximal to mid stomach Electronically Signed   By: Donavan Foil M.D.   On: 12/29/2017 00:51   Dg Abd Portable 1v  Result Date: 12/28/2017 CLINICAL DATA:  Evaluate NG tube placement EXAM: PORTABLE ABDOMEN - 1 VIEW COMPARISON:  None. FINDINGS: The distal tip of the NG tube is in the right side of the abdomen, likely in the distal antrum or pyloric region. The side port is in the distal body of the stomach. No other acute abnormalities. IMPRESSION: NG tube placement as above.  Electronically Signed   By: Dorise Bullion III M.D   On: 12/28/2017 09:41      Clovis Riley , [facility] 12/30/2017, 9:24 AM

## 2017-12-30 NOTE — Progress Notes (Signed)
CRITICAL VALUE ALERT  Critical Value:  6.6  Date & Time Notied:  12/30/17 0730  Provider Notified: Kae Heller, MD  Orders Received/Actions taken: 2 units of PRBCs ordered.   Pt is asymptomatic with low hemoglobin.

## 2017-12-31 LAB — URINALYSIS, COMPLETE (UACMP) WITH MICROSCOPIC
Bacteria, UA: NONE SEEN
Bilirubin Urine: NEGATIVE
GLUCOSE, UA: NEGATIVE mg/dL
Hgb urine dipstick: NEGATIVE
Ketones, ur: NEGATIVE mg/dL
Leukocytes, UA: NEGATIVE
Nitrite: NEGATIVE
PROTEIN: NEGATIVE mg/dL
Specific Gravity, Urine: 1.009 (ref 1.005–1.030)
pH: 6 (ref 5.0–8.0)

## 2017-12-31 LAB — GLUCOSE, CAPILLARY
GLUCOSE-CAPILLARY: 84 mg/dL (ref 65–99)
GLUCOSE-CAPILLARY: 69 mg/dL (ref 65–99)
GLUCOSE-CAPILLARY: 77 mg/dL (ref 65–99)
GLUCOSE-CAPILLARY: 107 mg/dL — AB (ref 65–99)
GLUCOSE-CAPILLARY: 130 mg/dL — AB (ref 65–99)
Glucose-Capillary: 76 mg/dL (ref 65–99)
Glucose-Capillary: 131 mg/dL — ABNORMAL HIGH (ref 65–99)

## 2017-12-31 LAB — CBC
HCT: 29.6 % — ABNORMAL LOW (ref 36.0–46.0)
HEMATOCRIT: 20 % — AB (ref 36.0–46.0)
Hemoglobin: 6.6 g/dL — CL (ref 12.0–15.0)
Hemoglobin: 10.2 g/dL — ABNORMAL LOW (ref 12.0–15.0)
MCH: 25.5 pg — ABNORMAL LOW (ref 26.0–34.0)
MCH: 27.2 pg (ref 26.0–34.0)
MCHC: 33 g/dL (ref 30.0–36.0)
MCHC: 34.5 g/dL (ref 30.0–36.0)
MCV: 77.2 fL — ABNORMAL LOW (ref 78.0–100.0)
MCV: 78.9 fL (ref 78.0–100.0)
PLATELETS: 431 10*3/uL — AB (ref 150–400)
PLATELETS: 383 10*3/uL (ref 150–400)
RBC: 2.59 MIL/uL — ABNORMAL LOW (ref 3.87–5.11)
RBC: 3.75 MIL/uL — AB (ref 3.87–5.11)
RDW: 15.7 % — AB (ref 11.5–15.5)
RDW: 15.4 % (ref 11.5–15.5)
WBC: 21.8 10*3/uL — AB (ref 4.0–10.5)
WBC: 20 10*3/uL — AB (ref 4.0–10.5)

## 2017-12-31 LAB — TYPE AND SCREEN
ABO/RH(D): AB POS
Antibody Screen: NEGATIVE
UNIT DIVISION: 0
UNIT DIVISION: 0
Unit division: 0
Unit division: 0

## 2017-12-31 LAB — COMPREHENSIVE METABOLIC PANEL
ALT: 30 U/L (ref 14–54)
ANION GAP: 9 (ref 5–15)
AST: 43 U/L — ABNORMAL HIGH (ref 15–41)
Albumin: 1.3 g/dL — ABNORMAL LOW (ref 3.5–5.0)
Alkaline Phosphatase: 221 U/L — ABNORMAL HIGH (ref 38–126)
BILIRUBIN TOTAL: 1.5 mg/dL — AB (ref 0.3–1.2)
BUN: 15 mg/dL (ref 6–20)
CO2: 21 mmol/L — ABNORMAL LOW (ref 22–32)
Calcium: 8 mg/dL — ABNORMAL LOW (ref 8.9–10.3)
Chloride: 106 mmol/L (ref 101–111)
Creatinine, Ser: 0.52 mg/dL (ref 0.44–1.00)
GFR calc non Af Amer: >60 mL/min (ref >60)
Glucose, Bld: 72 mg/dL (ref 65–99)
POTASSIUM: 3.6 mmol/L (ref 3.5–5.1)
Sodium: 136 mmol/L (ref 135–145)
TOTAL PROTEIN: 4.4 g/dL — AB (ref 6.5–8.1)

## 2017-12-31 LAB — BPAM RBC
BLOOD PRODUCT EXPIRATION DATE: 201901172359
BLOOD PRODUCT EXPIRATION DATE: 201902072359
Blood Product Expiration Date: 201901172359
Blood Product Expiration Date: 201902072359
ISSUE DATE / TIME: 201901101410
ISSUE DATE / TIME: 201901130956
ISSUE DATE / TIME: 201901131308
ISSUE DATE / TIME: 201901101410
UNIT TYPE AND RH: 8400
Unit Type and Rh: 5100
Unit Type and Rh: 5100
Unit Type and Rh: 8400

## 2017-12-31 LAB — PHOSPHORUS: PHOSPHORUS: 3.6 mg/dL (ref 2.5–4.6)

## 2017-12-31 LAB — DIFFERENTIAL
Basophils Absolute: 0 10*3/uL (ref 0.0–0.1)
Basophils Relative: 0 %
EOS PCT: 1 %
Eosinophils Absolute: 0.1 10*3/uL (ref 0.0–0.7)
LYMPHS PCT: 4 %
Lymphs Abs: 0.9 10*3/uL (ref 0.7–4.0)
Monocytes Absolute: 1.2 10*3/uL — ABNORMAL HIGH (ref 0.1–1.0)
Monocytes Relative: 6 %
NEUTROS ABS: 17.7 10*3/uL — AB (ref 1.7–7.7)
NEUTROS PCT: 89 %

## 2017-12-31 LAB — PREALBUMIN: Prealbumin: 5.3 mg/dL — ABNORMAL LOW (ref 18–38)

## 2017-12-31 LAB — MAGNESIUM: MAGNESIUM: 1.6 mg/dL — AB (ref 1.7–2.4)

## 2017-12-31 LAB — TRIGLYCERIDES: Triglycerides: 76 mg/dL (ref <150)

## 2017-12-31 MED ORDER — MAGNESIUM SULFATE 2 GM/50ML IV SOLN
2.0000 g | Freq: Once | INTRAVENOUS | Status: AC
  Administered 2017-12-31: 2 g via INTRAVENOUS
  Filled 2017-12-31: qty 50

## 2017-12-31 MED ORDER — HALOPERIDOL LACTATE 5 MG/ML IJ SOLN
1.0000 mg | Freq: Three times a day (TID) | INTRAMUSCULAR | Status: AC
  Administered 2017-12-31 – 2018-01-02 (×6): 1 mg via INTRAVENOUS
  Filled 2017-12-31 (×6): qty 1

## 2017-12-31 MED ORDER — PIPERACILLIN-TAZOBACTAM 3.375 G IVPB
3.3750 g | Freq: Three times a day (TID) | INTRAVENOUS | Status: DC
  Administered 2017-12-31 – 2018-01-18 (×54): 3.375 g via INTRAVENOUS
  Filled 2017-12-31 (×56): qty 50

## 2017-12-31 MED ORDER — TRAVASOL 10 % IV SOLN
INTRAVENOUS | Status: AC
  Administered 2017-12-31: 17:00:00 via INTRAVENOUS
  Filled 2017-12-31 (×2): qty 878.4

## 2017-12-31 MED ORDER — DEXTROSE 50 % IV SOLN
INTRAVENOUS | Status: AC
  Administered 2017-12-31: 25 mL
  Filled 2017-12-31: qty 50

## 2017-12-31 NOTE — Progress Notes (Signed)
PHARMACY - ADULT TOTAL PARENTERAL NUTRITION CONSULT NOTE   Pharmacy Consult: TPN Indication: Intolerance to enteral feeding  Patient Measurements: Height: 5' 5" (165.1 cm)(12/04/17) Weight: 127 lb 13.9 oz (58 kg) IBW/kg (Calculated) : 57 TPN AdjBW (KG): 57.6 Body mass index is 21.28 kg/m.  Assessment:  [name] with stage IV neuroendocrine small bowel cancer s/p ex-lap with SBR in December.  Patient was re-admitted from SNF after she began having nausea and vomiting and unable to tolerate enteral feeding.  Found to have a large dilated segment of bowel with bezoar and necrosis, now s/p SBR x 2.  Pharmacy managing TPN for nutritional support.  GI: prealbumin down to 5.3, LBM 1/9, emesis 100mL - IV PPI, PRN Zofran/Phenergan Endo: DM on metformin PTA. CBGs trending down (erratic when taking supplements) Insulin requirements in the past 24 hours: 21 units SSI + 45 units in TPN Lytes: slightly low CO2, Mag low at 1.6 (Na, K low normal) Renal: SCr 0.52 stable, BUN WNL - UOP 0.8 ml/kg/hr, net +8.7L since admit Pulm: stable on RA Cards: BP/HR elevated - ASA (plts 730 > 383, blood loss 935mL on 1/3) Hepatobil: alk phos/AST/tbili elevated (no jaundice).  TG WNL. Neuro: Lexapro, gabapentin, Remeron, PRN morphine ID: afebrile, WBC down to 20 - not on abx IVF: LR at 40ml/hr TPN Access: PICC placed 12/24/17 TPN start date: 12/25/17  Nutritional Goals (per RD rec on 1/10): 1500-1700 kCal and 75-90 gram protein per day  Current Nutrition: TPN Clear liquid diet (no intake charted) Boost 1 TIDWC (received none.  1 can = 240 kcal, 10g protein, 4g Fat, 41g Carbs)    Plan:  Continue TPN at goal rate of 60 ml/hr.  TPN provides 88g AA, 230g CHO, and 36g ILE, which equals to 1495 kCals per day, meeting 100% of patient needs. Electrolytes in TPN: Cl:Ac to 1:2, increase K/Na, Mag providing 22 mEq/d (max given calcium provision) Daily multivitamin and trace elements in TPN Mag sulfate 2gm IV x 1 Continue  moderate SSI Q4H + reduce insulin in TPN to 35 units LR at 40 ml/hr - consider reducing to KVO Watch platelet count F/U daily, labs on Wed    D. , PharmD, BCPS Pager:  [phone] 12/31/2017, 7:58 AM    

## 2017-12-31 NOTE — Care Management Note (Signed)
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         Action/Plan: In to speak with patient, daughter at bedside. Prior to admission patient lived at [facility]).   LCSW Isabel following for SNF. NCM will continue to follow for discharge needs.           Expected Discharge Plan:  [place]  Discharge planning Services  CM Consult  Status of Service:  In process, will continue to follow  Kristen Cardinal, RN  Case Manager [phone] 12/31/2017, 3:12 PM

## 2017-12-31 NOTE — Progress Notes (Signed)
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 Barry Dienes who returned on 01/07 with partial SBO  Partial SBO - S/P diagnostic laparoscopy, exploratory laparotomy, SBR x 2, 01/10, Dr. Barry Dienes - NG repeatedly pulled by patient, left out, no BM or flatus yet If vomiting is persistent, would replace.  FEN: TPN, NPO VTE: SCD's ID: low grade temps.  Check u/a and empiric zosyn   Foley: wick Add low dose haldol for sundowning.    DISPO: NPO, TPN, await return of bowel function.     LOS: 7 days    Subjective:  CC: pain today. Was OOB to chair for a while today   Objective: Vital signs in last 24 hours: Temp:  [98.2 F (36.8 C)-100.3 F (37.9 C)] 98.2 F (36.8 C) (01/14 1127) Pulse Rate:  [102-111] 106 (01/14 1200) Resp:  [21-28] 25 (01/14 1200) BP: (119-159)/(59-84) 143/76 (01/14 1200) SpO2:  [97 %-100 %] 97 % (01/14 1200) Weight:  [63 kg (138 lb 14.2 oz)] 63 kg (138 lb 14.2 oz) (01/14 1148) Last BM Date: 12/26/17  Intake/Output from previous day: 01/13 0701 - 01/14 0700 In: 1927.6 [I.V.:993; Blood:934.6] Out: 1250 [Urine:1150; Emesis/NG output:100] Intake/Output this shift: No intake/output data recorded.  PE: Gen:  Alert, NAD, pleasant, cooperative.  Goes back to sleep easily Card:  Mild tachycardia Pulm:  effort normal Abd: Soft, mildly distended, midline incision without purulent drainage Skin: no rashes noted, warm and dry   Anti-infectives: Anti-infectives (From admission, onward)   Start     Dose/Rate Route Frequency Ordered Stop   12/31/17 1300  piperacillin-tazobactam (ZOSYN) IVPB 3.375 g     3.375 g 12.5 mL/hr over 240 Minutes Intravenous Every 8 hours  12/31/17 1252        Lab Results:  Recent Labs    12/30/17 0500 12/31/17 0628  WBC 21.8* 20.0*  HGB 6.6* 10.2*  HCT 20.0* 29.6*  PLT 431* 383   BMET Recent Labs    12/30/17 0500 12/31/17 0628  NA 136 136  K 3.7 3.6  CL 106 106  CO2 21* 21*  GLUCOSE 164* 72  BUN 12 15  CREATININE 0.53 0.52  CALCIUM 7.7* 8.0*   PT/INR No results for input(s): LABPROT, INR in the last 72 hours. CMP     Component Value Date/Time   NA 136 12/31/2017 0628   K 3.6 12/31/2017 0628   CL 106 12/31/2017 0628   CO2 21 (L) 12/31/2017 0628   GLUCOSE 72 12/31/2017 0628   BUN 15 12/31/2017 0628   CREATININE 0.52 12/31/2017 0628   CREATININE 0.97 (H) 10/05/2017 1400   CALCIUM 8.0 (L) 12/31/2017 0628   PROT 4.4 (L) 12/31/2017 0628   ALBUMIN 1.3 (L) 12/31/2017 0628   AST 43 (H) 12/31/2017 0628   ALT 30 12/31/2017 0628   ALKPHOS 221 (H) 12/31/2017 0628   BILITOT 1.5 (H) 12/31/2017 0628   GFRNONAA >60 12/31/2017 0628   GFRNONAA 54 (L) 10/05/2017 1400   GFRAA >60 12/31/2017 0628   GFRAA 62 10/05/2017 1400   Lipase     Component Value Date/Time   LIPASE 54 10/05/2017 1400    Studies/Results: No results found.    Stark Klein , [facility] 12/31/2017, 12:57 PM

## 2017-12-31 NOTE — Significant Event (Signed)
Hypoglycemic Event  CBG: 69  Treatment: D50 IV 25 mL  Symptoms: None  Follow-up CBG: Time:1140  CBG Result:131   Possible Reasons for Event: Inadequate meal intake  Comments/MD notified:Dr. notified    Andrey Cota

## 2017-12-31 NOTE — Progress Notes (Signed)
Pharmacy Antibiotic Note  Theresa Howe is a 84 y.o. female admitted on 12/24/2017 with r/o intra-abdominal infection.  Pharmacy has been consulted for Zosyn dosing.  SCr 0.51 (stable), CrCL ~ 47 mL/min.  WBC elevated, slow trend down.  Tmax 100.3.   Plan: Zosyn 3.375g IV q8h- 4hr infuison.   Height: 5\' 5"  (165.1 cm)(12/04/17) Weight: 127 lb 13.9 oz (58 kg) IBW/kg (Calculated) : 57  Temp (24hrs), Avg:99.1 F (37.3 C), Min:98.2 F (36.8 C), Max:100.3 F (37.9 C)  Recent Labs  Lab 12/25/17 0415  12/27/17 0440 12/28/17 0844 12/29/17 0500 12/30/17 0500 12/31/17 0628  WBC 11.4*  --   --  24.3* 23.3* 21.8* 20.0*  CREATININE 0.66   < > 0.48 0.58 0.51 0.53 0.52   < > = values in this interval not displayed.    Estimated Creatinine Clearance: 47.1 mL/min (by C-G formula based on SCr of 0.52 mg/dL).    No Known Allergies  Antimicrobials this admission: Zosyn 1/14 >>  Dose adjustments this admission:   Microbiology results: 1/10 MRSA PCR: neg  Thank you for allowing pharmacy to be a part of this patient's care.  Sloan Leiter, PharmD, BCPS, BCCCP Clinical Pharmacist Clinical phone 12/31/2017 until 3:30PM [phone] After hours, please call [phone] 12/31/2017 12:49 PM

## 2017-12-31 NOTE — Progress Notes (Signed)
Patient has been tachy all day, temp was 100.2, rectal acetaminophen given at 1600 , rechecked 1720 and temp was 100.3. Paged Trauma Dr. Brantley Stage said to administer a bolus of LR since patient is NPO and on fluids at 100ml/hr. Alert Physician if temp goes over 102.5. Patient had a urinalysis sent and was started on zosyn. Abdominal wound had no edema, erythema, or foul smell, scant drainage.

## 2018-01-01 LAB — BASIC METABOLIC PANEL
ANION GAP: 8 (ref 5–15)
BUN: 18 mg/dL (ref 6–20)
CHLORIDE: 105 mmol/L (ref 101–111)
CO2: 22 mmol/L (ref 22–32)
CREATININE: 0.58 mg/dL (ref 0.44–1.00)
Calcium: 7.7 mg/dL — ABNORMAL LOW (ref 8.9–10.3)
GFR calc Af Amer: >60 mL/min (ref >60)
GFR calc non Af Amer: >60 mL/min (ref >60)
Glucose, Bld: 108 mg/dL — ABNORMAL HIGH (ref 65–99)
Potassium: 3.8 mmol/L (ref 3.5–5.1)
SODIUM: 135 mmol/L (ref 135–145)

## 2018-01-01 LAB — CBC
HEMATOCRIT: 28.9 % — AB (ref 36.0–46.0)
HEMOGLOBIN: 9.8 g/dL — AB (ref 12.0–15.0)
MCH: 26.6 pg (ref 26.0–34.0)
MCHC: 33.9 g/dL (ref 30.0–36.0)
MCV: 78.5 fL (ref 78.0–100.0)
Platelets: 424 10*3/uL — ABNORMAL HIGH (ref 150–400)
RBC: 3.68 MIL/uL — AB (ref 3.87–5.11)
RDW: 15.6 % — ABNORMAL HIGH (ref 11.5–15.5)
WBC: 21.6 10*3/uL — AB (ref 4.0–10.5)

## 2018-01-01 LAB — GLUCOSE, CAPILLARY
GLUCOSE-CAPILLARY: 98 mg/dL (ref 65–99)
GLUCOSE-CAPILLARY: 154 mg/dL — AB (ref 65–99)
Glucose-Capillary: 129 mg/dL — ABNORMAL HIGH (ref 65–99)
Glucose-Capillary: 112 mg/dL — ABNORMAL HIGH (ref 65–99)
Glucose-Capillary: 172 mg/dL — ABNORMAL HIGH (ref 65–99)

## 2018-01-01 MED ORDER — INSULIN ASPART 100 UNIT/ML ~~LOC~~ SOLN
0.0000 [IU] | Freq: Four times a day (QID) | SUBCUTANEOUS | Status: DC
  Administered 2018-01-01: 1 [IU] via SUBCUTANEOUS
  Administered 2018-01-01: 0 [IU] via SUBCUTANEOUS
  Administered 2018-01-02 (×2): 2 [IU] via SUBCUTANEOUS

## 2018-01-01 MED ORDER — ORAL CARE MOUTH RINSE
15.0000 mL | Freq: Two times a day (BID) | OROMUCOSAL | Status: DC
  Administered 2018-01-01 – 2018-01-25 (×35): 15 mL via OROMUCOSAL

## 2018-01-01 MED ORDER — TRAVASOL 10 % IV SOLN
INTRAVENOUS | Status: DC
  Filled 2018-01-01: qty 864

## 2018-01-01 MED ORDER — TRACE MINERALS CR-CU-MN-SE-ZN 10-1000-500-60 MCG/ML IV SOLN
INTRAVENOUS | Status: AC
Start: 2018-01-01 — End: 2018-01-02
  Administered 2018-01-01: 18:00:00 via INTRAVENOUS
  Filled 2018-01-01: qty 904.8

## 2018-01-01 MED ORDER — CHLORHEXIDINE GLUCONATE 0.12 % MT SOLN
15.0000 mL | Freq: Two times a day (BID) | OROMUCOSAL | Status: DC
  Administered 2018-01-01 – 2018-01-25 (×49): 15 mL via OROMUCOSAL
  Filled 2018-01-01 (×43): qty 15

## 2018-01-01 NOTE — Evaluation (Signed)
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 PMHx: DM, glaucoma, HTN, neuropathy   Clinical Impression   Pt admitted with above. She demonstrates the below listed deficits and will benefit from continued OT to maximize safety and independence with BADLs.  Pt presents to OT with generalized weakness, decreased activity tolerance, impaired balance, impaired cognition.  She currently requires min - mod A for ADLs, and mod A for functional mobility.  PTA, she was at [facility] for rehab.  Recommend return to SNF at discharge for continued therapies.  Will follow acutely.        Follow Up Recommendations  SNF    Equipment Recommendations  None recommended by OT    Recommendations for Other Services       Precautions / Restrictions Precautions Precautions: Fall Precaution Comments: Abdominial incision, sacral wound      Mobility Bed Mobility               General bed mobility comments: Pt in chair   Transfers Overall transfer level: Needs assistance Equipment used: Rolling walker (2 wheeled);2 person hand held assist Transfers: Sit to/from [employer] Sit to Stand: Min assist Stand pivot transfers: Mod assist       General transfer comment: MIn A to steady when standing.  Assist for balance during transfer and assist to maneuver RW     Balance Overall balance assessment: Needs assistance Sitting-balance support: Feet supported;No upper extremity supported Sitting balance-Leahy Scale: Good     Standing balance support: Bilateral upper extremity supported;During functional activity Standing balance-Leahy Scale: Poor Standing balance comment: reliant on UE support during functional mobility.                            ADL either performed or assessed with clinical judgement   ADL Overall ADL's : Needs assistance/impaired Eating/Feeding: NPO   Grooming: Wash/dry hands;Wash/dry face;Oral care;Brushing hair;Minimal assistance;Standing   Upper Body Bathing: Minimal assistance;Sitting   Lower Body Bathing: Moderate assistance;Sit to/from stand   Upper Body Dressing : Moderate assistance;Sitting   Lower Body Dressing: Moderate assistance;Sit to/from stand   Toilet Transfer: Moderate assistance;Ambulation;Comfort height toilet;Grab bars;RW   Toileting- Clothing Manipulation and Hygiene: Moderate assistance;Sit to/from stand Toileting - Clothing Manipulation Details (indicate cue type and reason): Pt incontinent of urine.  Assisted with peri care      Functional mobility during ADLs: Moderate assistance;Rolling walker       Vision         Perception     Praxis      Pertinent Vitals/Pain Pain Assessment: Faces Faces Pain Scale: Hurts a little bit Pain Location: Abdomen Pain Descriptors / Indicators: Operative site guarding;Grimacing Pain Intervention(s): Monitored during session     Hand Dominance Right   Extremity/Trunk Assessment Upper Extremity Assessment Upper Extremity Assessment: Generalized weakness   Lower Extremity Assessment Lower Extremity Assessment: Defer to PT evaluation   Cervical / Trunk Assessment Cervical / Trunk Assessment: Kyphotic   Communication Communication Communication: No difficulties   Cognition Arousal/Alertness: Awake/alert Behavior During Therapy: WFL for tasks assessed/performed Overall Cognitive Status: Impaired/Different from baseline Area of Impairment: Orientation;Attention;Memory;Following commands;Safety/judgement;Awareness;Problem solving                 Orientation Level: Disoriented to;Place;Time  Current Attention Level: Sustained Memory: Decreased short-term memory Following Commands: Follows one step commands  consistently;Follows one step commands with increased time Safety/Judgement: Decreased awareness of safety;Decreased awareness of deficits   Problem Solving: Slow processing;Decreased initiation;Difficulty sequencing;Requires verbal cues;Requires tactile cues General Comments: Pt thinks its 1956 and Sept.  SHe does know it's the 15th.   She is very slow to initiate and slow to process info    General Comments  VSS     Exercises     Shoulder Instructions      Home Living Family/patient expects to be discharged to:: Skilled nursing facility                                 Additional Comments: Pt discharged to SNF in December where she was receiving rehab.        Prior Functioning/Environment Level of Independence: Needs assistance  Gait / Transfers Assistance Needed: walks limited distance with assist of staff at SNF ADL's / Homemaking Assistance Needed: help for bathing and dressing            OT Problem List: Decreased strength;Decreased activity tolerance;Impaired balance (sitting and/or standing);Decreased cognition;Decreased safety awareness;Decreased knowledge of use of DME or AE      OT Treatment/Interventions: Self-care/ADL training;Therapeutic exercise;DME and/or AE instruction;Therapeutic activities;Cognitive remediation/compensation;Patient/family education;Balance training    OT Goals(Current goals can be found in the care plan section) Acute Rehab OT Goals OT Goal Formulation: Patient unable to participate in goal setting Time For Goal Achievement: 01/15/18 Potential to Achieve Goals: Good ADL Goals Pt Will Perform Grooming: with min guard assist;standing Pt Will Perform Upper Body Bathing: with supervision;sitting Pt Will Perform Lower Body Bathing: with min assist;sit to/from stand Pt Will Perform Upper Body Dressing: with supervision;sitting Pt Will Perform Lower Body Dressing: with min guard assist;sit to/from stand Pt Will Transfer to Toilet:  with min assist;ambulating;bedside commode;grab bars;regular height toilet  OT Frequency: Min 2X/week   Barriers to D/C: Decreased caregiver support          Co-evaluation              AM-PAC PT "6 Clicks" Daily Activity     Outcome Measure Help from another person eating meals?: Total Help from another person taking care of personal grooming?: A Little Help from another person toileting, which includes using toliet, bedpan, or urinal?: A Lot Help from another person bathing (including washing, rinsing, drying)?: A Lot Help from another person to put on and taking off regular upper body clothing?: A Lot Help from another person to put on and taking off regular lower body clothing?: A Lot 6 Click Score: 12   End of Session Equipment Utilized During Treatment: Rolling walker;Gait belt Nurse Communication: Mobility status  Activity Tolerance: Patient tolerated treatment well Patient left: in chair  OT Visit Diagnosis: Unsteadiness on feet (R26.81);Cognitive communication deficit (R41.841)                Time: 6213-0865 OT Time Calculation (min): 25 min Charges:  OT General Charges $OT Visit: 1 Visit OT Evaluation $OT Eval Moderate Complexity: 1 Mod OT Treatments $Self Care/Home Management : 8-22 mins G-Codes:     [employer], OTR/L [phone]   Jeani Hawking M 01/01/2018, 1:30 PM

## 2018-01-01 NOTE — Progress Notes (Addendum)
PHARMACY - ADULT TOTAL PARENTERAL NUTRITION CONSULT NOTE   Pharmacy Consult: TPN Indication: Intolerance to enteral feeding  Patient Measurements: Height: '5\' 5"'  (165.1 cm)(12/04/17) Weight: 138 lb 14.2 oz (63 kg) IBW/kg (Calculated) : 57 TPN AdjBW (KG): 57.6 Body mass index is 23.11 kg/m.  Assessment:  54  YOM with stage IV neuroendocrine small bowel cancer s/p ex-lap with SBR in December.  Patient was re-admitted from SNF after she began having nausea and vomiting and unable to tolerate enteral feeding.  Found to have a large dilated segment of bowel with bezoar and necrosis, now s/p SBR x 2.  Pharmacy managing TPN for nutritional support.  GI: prealbumin down to 5.3, LBM 1/9 - IV PPI, PRN Zofran/Phenergan Endo: DM on metformin PTA. CBGs low-low normal (erratic when taking supplements) Insulin requirements in the past 24 hours: 0 unit SSI + 35 units in TPN Lytes: all WNL Renal: SCr 0.58 stable, BUN WNL - UOP 0.8 ml/kg/hr, net +10.5L since admit, LR at 40 ml/hr Pulm: stable on RA Cards: BP improving, HR elevated - ASA (plts 730 > 424, blood loss 940m on 1/3) Hepatobil: alk phos/AST/tbili elevated (no jaundice).  TG WNL. Neuro: Lexapro, Remeron, PRN morphine, Haldol (QTc 4445m ID: Zosyn (1/14 >> ) for IAA - Tmax 100.3, WBC up to 21.6 TPN Access: PICC placed 12/24/17 TPN start date: 12/25/17  Nutritional Goals (per RD rec on 1/10): 1500-1700 kCal and 75-90 gram protein per day  Current Nutrition: TPN  Clear liquid diet (no intake charted) Boost 1 TIDWC (received none.  1 can = 240 kcal, 10g protein, 4g Fat, 41g Carbs)    Plan:  Increase TPN to 65 ml/hr.  TPN provides 90g AA, 234g CHO, and 37g ILE, which equals to 1531 kCals per day, meeting 100% of patient needs. Electrolytes in TPN: Cl:Ac 1:2, increase Na, Mag providing 22 mEq/d (max given calcium provision) Daily multivitamin and trace elements in TPN Change SSI to sensitive Q6H + reduce insulin in TPN to 25 units LR at 40  ml/hr per MD - consider reducing to [facility]/U prealbumin, nutritional status to maximize kCal provision further if needed F/U daily   Kalena Mander D. DaMina MarblePharmD, BCPS Pager:  [phone]/15/2019, 8:11 AM

## 2018-01-01 NOTE — Progress Notes (Signed)
1900: Handoff report received from RN. Pt resting with family visiting. Pt denies pain/discomfort; PAINAD = 0.  2200: PO meds held r/t NPO. Provider aware. No new orders.  0000: Pt continues resting comfortably.  0300: Notified on call surgery provider Cornett, MD of constipation. No new orders.  0400: Pt continues resting comfortably.  0600: Abdominal dressing changed. PICC dressing changed.   0700: Handoff report given to RN. No acute events overnight.

## 2018-01-01 NOTE — Progress Notes (Signed)
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 PMHx: DM, glaucoma, HTN, neuropathy    PT Comments    Pt very pleasant and willing to attempt mobility but fatigues easily. Pt in chair on arrival and per NT report has been up for several hours. Pt incontinent of urine and unaware. Pt able to walk short distance with RW and assist with improved distance from last admission. Pt educated for HEp and will continue to follow to maximize function.     Follow Up Recommendations  SNF;Supervision/Assistance - 24 hour     Equipment Recommendations  Rolling walker with 5" wheels;3in1 (PT)    Recommendations for Other Services       Precautions / Restrictions Precautions Precautions: Fall Precaution Comments: Abdominial incision, sacral wound    Mobility  Bed Mobility Overal bed mobility: Modified Independent Bed Mobility: Sit to Supine       Sit to supine: Min assist   General bed mobility comments: min assist to elevate legs onto surface  Transfers Overall transfer level: Needs assistance Equipment used: Rolling walker (2 wheeled);2 person hand held assist Transfers: Sit to/from Stand Sit to Stand: Min assist Stand pivot transfers: Min assist       General transfer comment: min assist with cues for hand placment and assist for balance and rise with use or RW to pivot chair to bed  Ambulation/Gait Ambulation/Gait assistance: Min assist Ambulation Distance (Feet): 70 Feet Assistive device: Rolling walker (2 wheeled) Gait Pattern/deviations: Step-through pattern;Decreased stride length;Trunk flexed;Narrow base of support   Gait velocity interpretation: Below normal speed for age/gender General Gait Details: cues for posture, max cues to step into  RW, assist to direct RW and for balance, limited by fatigue   Stairs            Wheelchair Mobility    Modified Rankin (Stroke Patients Only)       Balance Overall balance assessment: Needs assistance Sitting-balance support: Feet supported;No upper extremity supported Sitting balance-Leahy Scale: Good     Standing balance support: Bilateral upper extremity supported;During functional activity Standing balance-Leahy Scale: Poor Standing balance comment: reliant on UE support during standing                            Cognition Arousal/Alertness: Awake/alert Behavior During Therapy: WFL for tasks assessed/performed Overall Cognitive Status: Impaired/Different from baseline Area of Impairment: Orientation;Attention;Memory;Following commands;Safety/judgement;Awareness;Problem solving                 Orientation Level: Disoriented to;Place;Time Current Attention Level: Sustained Memory: Decreased short-term memory Following Commands: Follows one step commands consistently;Follows one step commands with increased time Safety/Judgement: Decreased awareness of safety;Decreased awareness of deficits   Problem Solving: Slow processing;Decreased initiation;Difficulty sequencing;Requires verbal cues;Requires tactile cues General Comments: pt keeps stating it is Saturday and unable to recall instructions and plan provided within session. incontinent on arrival and unaware      Exercises General Exercises - Lower Extremity Long Arc Quad: AROM;Both;10 reps;Seated Hip ABduction/ADduction: AROM;Both;10 reps;Seated Hip Flexion/Marching: AROM;10 reps;Seated;Both    General Comments General comments (skin integrity, edema, etc.): VSS       Pertinent Vitals/Pain Pain Assessment: No/denies pain Faces Pain Scale: Hurts a little bit Pain Location: Abdomen Pain Descriptors / Indicators: Operative  site guarding;Grimacing Pain Intervention(s): Monitored during  session    Home Living Family/patient expects to be discharged to:: Skilled nursing facility               Additional Comments: Pt discharged to SNF in December where she was receiving rehab.      Prior Function Level of Independence: Needs assistance  Gait / Transfers Assistance Needed: walks limited distance with assist of staff at SNF ADL's / Homemaking Assistance Needed: help for bathing and dressing     PT Goals (current goals can now be found in the care plan section) Progress towards PT goals: Progressing toward goals    Frequency    Min 2X/week      PT Plan Current plan remains appropriate    Co-evaluation              AM-PAC PT "6 Clicks" Daily Activity  Outcome Measure  Difficulty turning over in bed (including adjusting bedclothes, sheets and blankets)?: A Little Difficulty moving from lying on back to sitting on the side of the bed? : A Lot Difficulty sitting down on and standing up from a chair with arms (e.g., wheelchair, bedside commode, etc,.)?: Unable Help needed moving to and from a bed to chair (including a wheelchair)?: A Little Help needed walking in hospital room?: A Little Help needed climbing 3-5 steps with a railing? : A Lot 6 Click Score: 14    End of Session   Activity Tolerance: Patient tolerated treatment well Patient left: in bed;with call bell/phone within reach;with bed alarm set Nurse Communication: Mobility status PT Visit Diagnosis: Other abnormalities of gait and mobility (R26.89);Muscle weakness (generalized) (M62.81)     Time: 8657-8469 PT Time Calculation (min) (ACUTE ONLY): 20 min  Charges:  $Gait Training: 8-22 mins                    G Codes:       Delaney Meigs, PT [phone]    Jatoria Kneeland B Latesia Norrington 01/01/2018, 2:27 PM

## 2018-01-01 NOTE — Progress Notes (Signed)
Called Dr. Marlowe Aschoff office to ask a question about covering patient's blood sugar. Patient had a hypoglycemic episode yesterday which resulted in 54ml of D50. Concerned about patient bottoming out again.

## 2018-01-01 NOTE — Progress Notes (Signed)
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 Barry Dienes who returned on 01/07 with partial SBO  Partial SBO - S/P diagnostic laparoscopy, exploratory laparotomy, SBR x 2, 01/10, Dr. Barry Dienes No n/v, but no flatus yet.    FEN: TPN, NPO VTE: SCD's ID: low grade temps.  Empiric zosyn.   CV- remains tachycardic, slightly up today.  Would not send upstairs yet.   Foley: wick Low dose haldol for sundowning.    DISPO: NPO, TPN, await return of bowel function.     LOS: 8 days    Subjective:  CC: c/o back pain.  No acute events.    Objective: Vital signs in last 24 hours: Temp:  [98.3 F (36.8 C)-100.3 F (37.9 C)] 98.9 F (37.2 C) (01/15 0755) Pulse Rate:  [105-125] 112 (01/15 0800) Resp:  [20-30] 21 (01/15 0800) BP: (109-148)/(53-82) 130/63 (01/15 0755) SpO2:  [94 %-98 %] 96 % (01/15 0800) Last BM Date: 12/27/17  Intake/Output from previous day: 01/14 0701 - 01/15 0700 In: 2303 [I.V.:2203; IV Piggyback:100] Out: 500 [Urine:500] Intake/Output this shift: No intake/output data recorded.  PE: Gen:  Alert, NAD, pleasant, cooperative.  Goes back to sleep easily Card:  Mild tachycardia Pulm:  effort normal Abd: Soft, nondistended, midline incision without purulent drainage Skin: no rashes noted, warm and dry   Anti-infectives: Anti-infectives (From admission, onward)   Start     Dose/Rate Route Frequency Ordered Stop   12/31/17 1300  piperacillin-tazobactam (ZOSYN) IVPB 3.375 g     3.375 g 12.5 mL/hr over 240 Minutes Intravenous Every 8 hours 12/31/17 1252        Lab Results:  Recent Labs    12/31/17 0628 01/01/18 0542  WBC 20.0* 21.6*  HGB 10.2* 9.8*  HCT 29.6*  28.9*  PLT 383 424*   BMET Recent Labs    12/31/17 0628 01/01/18 0542  NA 136 135  K 3.6 3.8  CL 106 105  CO2 21* 22  GLUCOSE 72 108*  BUN 15 18  CREATININE 0.52 0.58  CALCIUM 8.0* 7.7*   PT/INR No results for input(s): LABPROT, INR in the last 72 hours. CMP     Component Value Date/Time   NA 135 01/01/2018 0542   K 3.8 01/01/2018 0542   CL 105 01/01/2018 0542   CO2 22 01/01/2018 0542   GLUCOSE 108 (H) 01/01/2018 0542   BUN 18 01/01/2018 0542   CREATININE 0.58 01/01/2018 0542   CREATININE 0.97 (H) 10/05/2017 1400   CALCIUM 7.7 (L) 01/01/2018 0542   PROT 4.4 (L) 12/31/2017 0628   ALBUMIN 1.3 (L) 12/31/2017 0628   AST 43 (H) 12/31/2017 0628   ALT 30 12/31/2017 0628   ALKPHOS 221 (H) 12/31/2017 0628   BILITOT 1.5 (H) 12/31/2017 0628   GFRNONAA >60 01/01/2018 0542   GFRNONAA 54 (L) 10/05/2017 1400   GFRAA >60 01/01/2018 0542   GFRAA 62 10/05/2017 1400   Lipase     Component Value Date/Time   LIPASE 54 10/05/2017 1400    Studies/Results: No results found.    Theresa Howe , [facility] 01/01/2018, 2:00 PM

## 2018-01-02 LAB — BASIC METABOLIC PANEL
ANION GAP: 12 (ref 5–15)
BUN: 20 mg/dL (ref 6–20)
CALCIUM: 8 mg/dL — AB (ref 8.9–10.3)
CO2: 21 mmol/L — ABNORMAL LOW (ref 22–32)
Chloride: 101 mmol/L (ref 101–111)
Creatinine, Ser: 0.71 mg/dL (ref 0.44–1.00)
GFR calc Af Amer: >60 mL/min (ref >60)
Glucose, Bld: 205 mg/dL — ABNORMAL HIGH (ref 65–99)
POTASSIUM: 4.2 mmol/L (ref 3.5–5.1)
SODIUM: 134 mmol/L — AB (ref 135–145)

## 2018-01-02 LAB — GLUCOSE, CAPILLARY
GLUCOSE-CAPILLARY: 216 mg/dL — AB (ref 65–99)
GLUCOSE-CAPILLARY: 151 mg/dL — AB (ref 65–99)
GLUCOSE-CAPILLARY: 119 mg/dL — AB (ref 65–99)
Glucose-Capillary: 141 mg/dL — ABNORMAL HIGH (ref 65–99)
Glucose-Capillary: 149 mg/dL — ABNORMAL HIGH (ref 65–99)
Glucose-Capillary: 156 mg/dL — ABNORMAL HIGH (ref 65–99)
Glucose-Capillary: 181 mg/dL — ABNORMAL HIGH (ref 65–99)
Glucose-Capillary: 200 mg/dL — ABNORMAL HIGH (ref 65–99)

## 2018-01-02 LAB — CBC
HCT: 28.2 % — ABNORMAL LOW (ref 36.0–46.0)
Hemoglobin: 9.4 g/dL — ABNORMAL LOW (ref 12.0–15.0)
MCH: 26.6 pg (ref 26.0–34.0)
MCHC: 33.3 g/dL (ref 30.0–36.0)
MCV: 79.7 fL (ref 78.0–100.0)
PLATELETS: 472 10*3/uL — AB (ref 150–400)
RBC: 3.54 MIL/uL — AB (ref 3.87–5.11)
RDW: 16.1 % — AB (ref 11.5–15.5)
WBC: 18.4 10*3/uL — AB (ref 4.0–10.5)

## 2018-01-02 LAB — MAGNESIUM: MAGNESIUM: 1.7 mg/dL (ref 1.7–2.4)

## 2018-01-02 MED ORDER — ENOXAPARIN SODIUM 40 MG/0.4ML ~~LOC~~ SOLN
40.0000 mg | SUBCUTANEOUS | Status: DC
  Administered 2018-01-02 – 2018-01-07 (×6): 40 mg via SUBCUTANEOUS
  Filled 2018-01-02 (×7): qty 0.4

## 2018-01-02 MED ORDER — INSULIN ASPART 100 UNIT/ML ~~LOC~~ SOLN
0.0000 [IU] | SUBCUTANEOUS | Status: DC
  Administered 2018-01-02: 2 [IU] via SUBCUTANEOUS
  Administered 2018-01-02: 5 [IU] via SUBCUTANEOUS
  Administered 2018-01-02 – 2018-01-03 (×2): 3 [IU] via SUBCUTANEOUS
  Administered 2018-01-03: 5 [IU] via SUBCUTANEOUS
  Administered 2018-01-03: 3 [IU] via SUBCUTANEOUS
  Administered 2018-01-03: 5 [IU] via SUBCUTANEOUS
  Administered 2018-01-03: 2 [IU] via SUBCUTANEOUS
  Administered 2018-01-04: 3 [IU] via SUBCUTANEOUS
  Administered 2018-01-04: 5 [IU] via SUBCUTANEOUS
  Administered 2018-01-04: 3 [IU] via SUBCUTANEOUS
  Administered 2018-01-04: 2 [IU] via SUBCUTANEOUS
  Administered 2018-01-04: 3 [IU] via SUBCUTANEOUS
  Administered 2018-01-05: 2 [IU] via SUBCUTANEOUS
  Administered 2018-01-05 (×4): 3 [IU] via SUBCUTANEOUS
  Administered 2018-01-05 – 2018-01-06 (×2): 2 [IU] via SUBCUTANEOUS
  Administered 2018-01-06: 3 [IU] via SUBCUTANEOUS
  Administered 2018-01-06 (×2): 5 [IU] via SUBCUTANEOUS
  Administered 2018-01-06: 3 [IU] via SUBCUTANEOUS
  Administered 2018-01-07 (×4): 2 [IU] via SUBCUTANEOUS
  Administered 2018-01-07 (×2): 3 [IU] via SUBCUTANEOUS
  Administered 2018-01-08 – 2018-01-09 (×6): 2 [IU] via SUBCUTANEOUS
  Administered 2018-01-09 (×2): 3 [IU] via SUBCUTANEOUS
  Administered 2018-01-09 – 2018-01-10 (×2): 2 [IU] via SUBCUTANEOUS
  Administered 2018-01-10: 3 [IU] via SUBCUTANEOUS
  Administered 2018-01-10: 2 [IU] via SUBCUTANEOUS
  Administered 2018-01-10: 3 [IU] via SUBCUTANEOUS
  Administered 2018-01-10: 2 [IU] via SUBCUTANEOUS
  Administered 2018-01-11 (×2): 3 [IU] via SUBCUTANEOUS
  Administered 2018-01-11 – 2018-01-12 (×4): 2 [IU] via SUBCUTANEOUS
  Administered 2018-01-12: 3 [IU] via SUBCUTANEOUS
  Administered 2018-01-12: 5 [IU] via SUBCUTANEOUS
  Administered 2018-01-12 (×2): 2 [IU] via SUBCUTANEOUS
  Administered 2018-01-12 – 2018-01-14 (×6): 3 [IU] via SUBCUTANEOUS
  Administered 2018-01-14: 2 [IU] via SUBCUTANEOUS
  Administered 2018-01-14 – 2018-01-15 (×7): 3 [IU] via SUBCUTANEOUS
  Administered 2018-01-15: 8 [IU] via SUBCUTANEOUS
  Administered 2018-01-15 (×2): 3 [IU] via SUBCUTANEOUS
  Administered 2018-01-16 (×4): 2 [IU] via SUBCUTANEOUS
  Administered 2018-01-17: 3 [IU] via SUBCUTANEOUS
  Administered 2018-01-17: 2 [IU] via SUBCUTANEOUS
  Administered 2018-01-17: 3 [IU] via SUBCUTANEOUS
  Administered 2018-01-17: 5 [IU] via SUBCUTANEOUS
  Administered 2018-01-18 (×2): 2 [IU] via SUBCUTANEOUS

## 2018-01-02 MED ORDER — MAGNESIUM SULFATE 2 GM/50ML IV SOLN
2.0000 g | Freq: Once | INTRAVENOUS | Status: AC
  Administered 2018-01-02: 2 g via INTRAVENOUS
  Filled 2018-01-02: qty 50

## 2018-01-02 MED ORDER — POTASSIUM ACETATE 2 MEQ/ML IV SOLN
INTRAVENOUS | Status: AC
Start: 2018-01-02 — End: 2018-01-03
  Administered 2018-01-02: 18:00:00 via INTRAVENOUS
  Filled 2018-01-02: qty 904.8

## 2018-01-02 NOTE — Progress Notes (Signed)
Nutrition Follow-up  DOCUMENTATION CODES:   Severe malnutrition in context of chronic illness  INTERVENTION:   TPN per Pharmacy.  RD to continue to monitor.   NUTRITION DIAGNOSIS:   Severe Malnutrition related to chronic illness(stage IV neuroendocrine tumor) as evidenced by energy intake < 75% for > or equal to 1 month, severe fat depletion, severe muscle depletion; ongoing  GOAL:   Patient will meet greater than or equal to 90% of their needs; met  MONITOR:   Diet advancement, Labs, Weight trends, I & O's, Skin, Other (Comment)(TPN)  REASON FOR ASSESSMENT:   Malnutrition Screening Tool, Consult New TPN/TNA  ASSESSMENT:   Patient is a 84 year old female who is status post laparoscopic assisted small bowel resection in mid December.  She was discharged to a skilled nursing facility.  She was doing better, but now is throwing up almost everything that she eats.  She is able to keep down a small amount of Ensure.  She also continues to have some postop pain.  She denies fevers and chills.  She is having difficulty working with physical therapy due to weakness.  She is also having difficulty taking her medication because of nausea and vomiting.  Pt reports no abdominal discomfort during time of visit. TPN infusing at goal rate of 65 ml/hr which is providing 1531 kcals, and 90 grams of protein. Per MD note, plans to continue TPN up through Monday 1/21. RD to continue to monitor especially if diet is advanced to order nutritional supplements.   Diet Order:  Diet NPO time specified Except for: Sips with Meds TPN ADULT (ION) TPN ADULT (ION)  EDUCATION NEEDS:   Education needs have been addressed  Skin:  Skin Assessment: Skin Integrity Issues: Skin Integrity Issues:: Stage II Stage II: coccyx, buttocks Incisions: abdominal incision  Last BM:  1/15  Height:   Ht Readings from Last 1 Encounters:  12/24/17 '5\' 5"'  (1.651 m)    Weight:   Wt Readings from Last 1 Encounters:   12/31/17 138 lb 14.2 oz (63 kg)    Ideal Body Weight:  56.8 kg  BMI:  Body mass index is 23.11 kg/m.  Estimated Nutritional Needs:   Kcal:  1500-1700  Protein:  75-90 grams  Fluid:  > 1.5 L    Corrin Parker, MS, RD, LDN Pager # [phone] After hours/ weekend pager # [phone]

## 2018-01-02 NOTE — Progress Notes (Signed)
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 Barry Dienes who returned on 01/07 with partial SBO  Partial SBO - S/P diagnostic laparoscopy, exploratory laparotomy, SBR x 2, 01/10, Dr. Barry Dienes Resolved.  Given condition of bowel overall, plan to keep NPO/TNA until Monday 1/21.  Also, would not expect her to take in >25% of caloric needs po for at least 1 week more.    FEN: TPN, NPO VTE: SCD's, restart lovenox prophylaxis.   ID: low grade temps improved.  Empiric zosyn for 5 days.   CV- tachycardia improved.  OK to transfer to floor.   Foley: wick Low dose haldol for sundowning.    DISPO: NPO, TPN     LOS: 9 days    Subjective:  CC: c/o feeling cold.    Apparently, she had a small BM last night.    Objective: Vital signs in last 24 hours: Temp:  [98.3 F (36.8 C)-98.9 F (37.2 C)] 98.7 F (37.1 C) (01/16 0836) Pulse Rate:  [97-125] 104 (01/16 0500) Resp:  [20-31] 20 (01/16 0700) BP: (104-121)/(49-63) 116/54 (01/16 0400) SpO2:  [85 %-99 %] 85 % (01/16 0500) Last BM Date: 01/01/18  Intake/Output from previous day: 01/15 0701 - 01/16 0700 In: 0  Out: 100 [Urine:100] Intake/Output this shift: No intake/output data recorded.  PE: Gen:  Alert, NAD, pleasant, cooperative.  Goes back to sleep easily Card:  RR&R Pulm:  effort normal Abd: Soft, nondistended, midline incision without purulent drainage Skin: no rashes noted, warm and dry   Anti-infectives: Anti-infectives (From admission, onward)   Start     Dose/Rate Route Frequency Ordered Stop   12/31/17 1300  piperacillin-tazobactam (ZOSYN) IVPB 3.375 g     3.375 g 12.5 mL/hr over 240 Minutes Intravenous Every 8 hours  12/31/17 1252        Lab Results:  Recent Labs    01/01/18 0542 01/02/18 0500  WBC 21.6* 18.4*  HGB 9.8* 9.4*  HCT 28.9* 28.2*  PLT 424* 472*   BMET Recent Labs    01/01/18 0542 01/02/18 0500  NA 135 134*  K 3.8 4.2  CL 105 101  CO2 22 21*  GLUCOSE 108* 205*  BUN 18 20  CREATININE 0.58 0.71  CALCIUM 7.7* 8.0*   PT/INR No results for input(s): LABPROT, INR in the last 72 hours. CMP     Component Value Date/Time   NA 134 (L) 01/02/2018 0500   K 4.2 01/02/2018 0500   CL 101 01/02/2018 0500   CO2 21 (L) 01/02/2018 0500   GLUCOSE 205 (H) 01/02/2018 0500   BUN 20 01/02/2018 0500   CREATININE 0.71 01/02/2018 0500   CREATININE 0.97 (H) 10/05/2017 1400   CALCIUM 8.0 (L) 01/02/2018 0500   PROT 4.4 (L) 12/31/2017 0628   ALBUMIN 1.3 (L) 12/31/2017 0628   AST 43 (H) 12/31/2017 0628   ALT 30 12/31/2017 0628   ALKPHOS 221 (H) 12/31/2017 0628   BILITOT 1.5 (H) 12/31/2017 0628   GFRNONAA >60 01/02/2018 0500   GFRNONAA 54 (L) 10/05/2017 1400   GFRAA >60 01/02/2018 0500   GFRAA 62 10/05/2017 1400   Lipase     Component Value Date/Time   LIPASE 54 10/05/2017 1400    Studies/Results: No results found.  Stark Klein , [facility] 01/02/2018, 10:30 AM

## 2018-01-02 NOTE — Progress Notes (Signed)
PHARMACY - ADULT TOTAL PARENTERAL NUTRITION CONSULT NOTE   Pharmacy Consult: TPN Indication: Intolerance to enteral feeding  Patient Measurements: Height: '5\' 5"'  (165.1 cm)(12/04/17) Weight: 138 lb 14.2 oz (63 kg) IBW/kg (Calculated) : 57 TPN AdjBW (KG): 57.6 Body mass index is 23.11 kg/m.  Assessment:  24  YOM with stage IV neuroendocrine small bowel cancer s/p ex-lap with SBR in December.  Patient was re-admitted from SNF after she began having nausea and vomiting and unable to tolerate enteral feeding.  Found to have a large dilated segment of bowel with bezoar and necrosis, now s/p SBR x 2.  Pharmacy managing TPN for nutritional support.  GI: prealbumin down to 5.3, LBM 1/9 - IV PPI, PRN Zofran/Phenergan Endo: DM on metformin PTA. CBGs erratic (?no PO intake charted but patient is eating).  Will rely on SSI to cover for hyperglycemia instead of increasing insulin in TPN and expose pt to risk of hypoglycemia Insulin requirements in the past 24 hours: 3 units SSI + 25 units in TPN Lytes: Na 134, CO2 slightly low, Mag low normal (max in TPN, may need bolus q48-72h: 2gm 1/14, 4g 1/16) Renal: SCr 0.71 stable, BUN WNL - UOP 0.1 ml/kg/hr, net +10.4L since admit, LR at [place]: stable on RA Cards: BP controlled, HR elevated - ASA (plts 730 > 472, blood loss 964m on 1/3) Hepatobil: alk phos/AST/tbili elevated (no jaundice).  TG WNL. Neuro: Lexapro, Remeron, PRN morphine, Haldol (QTc 4439m ID: Zosyn (1/14 >> ) for IAA - now afebrile, WBC down to 18.4 TPN Access: PICC placed 12/24/17 TPN start date: 12/25/17  Nutritional Goals (per RD rec on 1/10): 1500-1700 kCal and 75-90 gram protein per day  Current Nutrition: TPN  Clear liquid diet (no intake charted)   Plan:  Continue TPN at 65 ml/hr, providing 90g AA, 234g CHO, and 37g ILE, which equals to 1531 kCals per day, meeting 100% of patient needs. Electrolytes in TPN: Cl:Ac 1:2, increase Na 1/15, Mag providing 22 mEq/d (max given calcium  provision) Daily multivitamin and trace elements in TPN Change SSI to moderate Q4H + continue 25 units regular insulin in TPN Mag sulfate 4gm IV x 1 F/U prealbumin, nutritional status to maximize kCal provision further if needed F/U daily   Kierston Plasencia D. DaMina MarblePharmD, BCPS Pager:  [phone]/16/2019, 8:19 AM

## 2018-01-02 NOTE — Plan of Care (Signed)
Patient confused, having 3 loose stools today.  Patient remains NPO with TPA infusing.

## 2018-01-03 LAB — COMPREHENSIVE METABOLIC PANEL
ALBUMIN: 1.3 g/dL — AB (ref 3.5–5.0)
ALK PHOS: 216 U/L — AB (ref 38–126)
ALT: 18 U/L (ref 14–54)
AST: 22 U/L (ref 15–41)
Anion gap: 10 (ref 5–15)
BUN: 16 mg/dL (ref 6–20)
CALCIUM: 7.8 mg/dL — AB (ref 8.9–10.3)
CHLORIDE: 101 mmol/L (ref 101–111)
CO2: 23 mmol/L (ref 22–32)
CREATININE: 0.7 mg/dL (ref 0.44–1.00)
GFR calc non Af Amer: >60 mL/min (ref >60)
GLUCOSE: 238 mg/dL — AB (ref 65–99)
Potassium: 3.8 mmol/L (ref 3.5–5.1)
SODIUM: 134 mmol/L — AB (ref 135–145)
Total Bilirubin: 1.4 mg/dL — ABNORMAL HIGH (ref 0.3–1.2)
Total Protein: 4.6 g/dL — ABNORMAL LOW (ref 6.5–8.1)

## 2018-01-03 LAB — MAGNESIUM: Magnesium: 1.8 mg/dL (ref 1.7–2.4)

## 2018-01-03 LAB — GLUCOSE, CAPILLARY
GLUCOSE-CAPILLARY: 226 mg/dL — AB (ref 65–99)
GLUCOSE-CAPILLARY: 152 mg/dL — AB (ref 65–99)
GLUCOSE-CAPILLARY: 152 mg/dL — AB (ref 65–99)
GLUCOSE-CAPILLARY: 232 mg/dL — AB (ref 65–99)
Glucose-Capillary: 161 mg/dL — ABNORMAL HIGH (ref 65–99)
Glucose-Capillary: 179 mg/dL — ABNORMAL HIGH (ref 65–99)

## 2018-01-03 LAB — PHOSPHORUS: PHOSPHORUS: 3.5 mg/dL (ref 2.5–4.6)

## 2018-01-03 MED ORDER — TRAVASOL 10 % IV SOLN
INTRAVENOUS | Status: AC
  Administered 2018-01-03: 17:00:00 via INTRAVENOUS
  Filled 2018-01-03: qty 904.8

## 2018-01-03 MED ORDER — FUROSEMIDE 10 MG/ML IJ SOLN
20.0000 mg | Freq: Every day | INTRAMUSCULAR | Status: AC
  Administered 2018-01-03 – 2018-01-05 (×3): 20 mg via INTRAVENOUS
  Filled 2018-01-03 (×3): qty 2

## 2018-01-03 MED ORDER — POTASSIUM CHLORIDE 10 MEQ/50ML IV SOLN
10.0000 meq | INTRAVENOUS | Status: AC
  Administered 2018-01-03 (×2): 10 meq via INTRAVENOUS
  Filled 2018-01-03 (×2): qty 50

## 2018-01-03 NOTE — Progress Notes (Signed)
Patient transferred from 4N via bed. Alert,responding verbally. With PICC line in situ. Purewick external catheter attached to suction. Family at bedside

## 2018-01-03 NOTE — Progress Notes (Signed)
Called to give report. Patient being transferred to 6N06. Nurse asked to call her back in 5 minutes.

## 2018-01-03 NOTE — Progress Notes (Signed)
Report given to [facility]. Patient will have mouth care provided and 1200 insulin administered then will be transferred. Family is at bedside. No further questions are had for me.

## 2018-01-03 NOTE — Progress Notes (Addendum)
PHARMACY - ADULT TOTAL PARENTERAL NUTRITION CONSULT NOTE   Pharmacy Consult: TPN Indication: Intolerance to enteral feeding  Patient Measurements: Height: '5\' 5"'  (165.1 cm)(12/04/17) Weight: 138 lb 14.2 oz (63 kg) IBW/kg (Calculated) : 57 TPN AdjBW (KG): 57.6 Body mass index is 23.11 kg/m.  Assessment:  70  YOM with stage IV neuroendocrine small bowel cancer s/p ex-lap with SBR in December.  Patient was re-admitted from SNF after she began having nausea and vomiting and unable to tolerate enteral feeding.  Found to have a large dilated segment of bowel with bezoar and necrosis, now s/p SBR x 2.  Pharmacy managing TPN for nutritional support.  GI: prealbumin down to 5.3, LBM x3 per day - IV PPI, PRN Zofran/Phenergan Endo: DM on metformin PTA. Unsure why CBGs are erratic 119-238.  Will rely on SSI to cover for hyperglycemia instead of increasing insulin in TPN and expose pt to risk of hypoglycemia Insulin requirements in the past 24 hours: 14 units SSI + 25 units in TPN Lytes: Na 134, Mag low normal (max in TPN, 2gm 1/14, 4g 1/16 and 1/17, ?supplement qod), others WNL Renal: SCr 0.7 stable, BUN WNL - UOP 0.3 ml/kg/hr, net +14L since admit, LR at [place]: stable on RA Cards: BP controlled, HR elevated - ASA (plts 730 > 472, blood loss 960m on 1/3) Hepatobil: alk phos elevated, AST/ALT WNL, tbili down to 1.4.  TG WNL. Neuro: confused - Lexapro, Remeron, s/p Haldol for sundowning (QTc 4443m ID: Zosyn (1/14 >> ) for IAA - now afebrile, WBC down to 18.4 TPN Access: PICC placed 12/24/17 TPN start date: 12/25/17  Nutritional Goals (per RD rec on 1/10): 1500-1700 kCal and 75-90 gram protein per day  Current Nutrition: TPN    Plan:  Continue TPN at 65 ml/hr, providing 90g AA, 234g CHO, and 37g ILE, which equals to 1531 kCals per day, meeting 100% of patient needs. Electrolytes in TPN: Cl:Ac 1:2, Na increased 1/15, Mag providing 22 mEq/d (max given calcium provision) Daily multivitamin and  trace elements in TPN Continue moderate Q4H + 25 units regular insulin in TPN Repeat mag sulfate 4gm IV x 1 Per discussion with Dr. ByBarry DienesLasix 209mV daily x3 days and trend SCr.  KCL x2 runs today.  F/U K+ in AM and may order +/- 2 runs with each Lasix dose if needed.  Hold off on increasing Na in TPN. F/U prealbumin, nutritional status to maximize kCal provision further if needed, volume status F/U AM labs   Rachelanne Whidby D. DanMina MarbleharmD, BCPS Pager:  [phone] 128917/2019, 8:04 AM

## 2018-01-03 NOTE — Progress Notes (Signed)
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 Barry Dienes who returned on 01/07 with partial SBO  Partial SBO - S/P diagnostic laparoscopy, exploratory laparotomy, SBR x 2, 01/10, Dr. Barry Dienes Resolved.  Given condition of bowel overall, plan to keep NPO/TNA until Monday 1/21.  Also, would not expect her to take in >25% of caloric needs po for at least 1 week more.    FEN: TPN, NPO VTE: SCD's, lovenox prophylaxis.   ID: low grade temps improved, but leukocytosis continues.  Empiric zosyn for 5 days.   CV-     Low dose haldol for sundowning.    DISPO: NPO, TPN     LOS: 10 days    Subjective:  CC: denies pain/nausea.  Unsure if she is passing gas.    Objective: Vital signs in last 24 hours: Temp:  [97.9 F (36.6 C)-99.1 F (37.3 C)] 98.1 F (36.7 C) (01/17 1128) Pulse Rate:  [95-102] 101 (01/17 1128) Resp:  [17-34] 20 (01/17 1200) BP: (116-140)/(59-77) 125/67 (01/17 1200) SpO2:  [95 %-100 %] 100 % (01/17 1128) Last BM Date: 01/02/18  Intake/Output from previous day: 01/16 0701 - 01/17 0700 In: 4361.2 [P.O.:50; I.V.:4161.2; IV Piggyback:150] Out: 450 [Urine:450] Intake/Output this shift: No intake/output data recorded.  PE: Gen:  Alert, NAD, pleasant, cooperative.  More awake than on previous exams.   Card:  RR&R Pulm:  effort normal Abd: Soft, nondistended, midline incision without purulent drainage Skin: no rashes noted, warm and dry   Anti-infectives: Anti-infectives (From admission, onward)   Start     Dose/Rate Route Frequency Ordered Stop   12/31/17 1300  piperacillin-tazobactam (ZOSYN) IVPB 3.375 g     3.375 g 12.5 mL/hr over 240 Minutes Intravenous Every 8  hours 12/31/17 1252        Lab Results:  Recent Labs    01/01/18 0542 01/02/18 0500  WBC 21.6* 18.4*  HGB 9.8* 9.4*  HCT 28.9* 28.2*  PLT 424* 472*   BMET Recent Labs    01/02/18 0500 01/03/18 0433  NA 134* 134*  K 4.2 3.8  CL 101 101  CO2 21* 23  GLUCOSE 205* 238*  BUN 20 16  CREATININE 0.71 0.70  CALCIUM 8.0* 7.8*   PT/INR No results for input(s): LABPROT, INR in the last 72 hours. CMP     Component Value Date/Time   NA 134 (L) 01/03/2018 0433   K 3.8 01/03/2018 0433   CL 101 01/03/2018 0433   CO2 23 01/03/2018 0433   GLUCOSE 238 (H) 01/03/2018 0433   BUN 16 01/03/2018 0433   CREATININE 0.70 01/03/2018 0433   CREATININE 0.97 (H) 10/05/2017 1400   CALCIUM 7.8 (L) 01/03/2018 0433   PROT 4.6 (L) 01/03/2018 0433   ALBUMIN 1.3 (L) 01/03/2018 0433   AST 22 01/03/2018 0433   ALT 18 01/03/2018 0433   ALKPHOS 216 (H) 01/03/2018 0433   BILITOT 1.4 (H) 01/03/2018 0433   GFRNONAA >60 01/03/2018 0433   GFRNONAA 54 (L) 10/05/2017 1400   GFRAA >60 01/03/2018 0433   GFRAA 62 10/05/2017 1400   Lipase     Component Value Date/Time   LIPASE 54 10/05/2017 1400    Studies/Results: No results found.    Stark Klein , [facility] 01/03/2018, 3:17 PM

## 2018-01-04 ENCOUNTER — Inpatient Hospital Stay (HOSPITAL_COMMUNITY): Payer: Medicare Other

## 2018-01-04 LAB — GLUCOSE, CAPILLARY
GLUCOSE-CAPILLARY: 131 mg/dL — AB (ref 65–99)
GLUCOSE-CAPILLARY: 169 mg/dL — AB (ref 65–99)
Glucose-Capillary: 197 mg/dL — ABNORMAL HIGH (ref 65–99)
Glucose-Capillary: 228 mg/dL — ABNORMAL HIGH (ref 65–99)
Glucose-Capillary: 165 mg/dL — ABNORMAL HIGH (ref 65–99)
Glucose-Capillary: 150 mg/dL — ABNORMAL HIGH (ref 65–99)

## 2018-01-04 LAB — BASIC METABOLIC PANEL
Anion gap: 12 (ref 5–15)
BUN: 17 mg/dL (ref 6–20)
CALCIUM: 8.2 mg/dL — AB (ref 8.9–10.3)
CO2: 21 mmol/L — ABNORMAL LOW (ref 22–32)
CREATININE: 0.6 mg/dL (ref 0.44–1.00)
Chloride: 102 mmol/L (ref 101–111)
GFR calc Af Amer: >60 mL/min (ref >60)
GLUCOSE: 224 mg/dL — AB (ref 65–99)
POTASSIUM: 4.4 mmol/L (ref 3.5–5.1)
Sodium: 135 mmol/L (ref 135–145)

## 2018-01-04 LAB — CBC
HEMATOCRIT: 29 % — AB (ref 36.0–46.0)
HEMOGLOBIN: 9.8 g/dL — AB (ref 12.0–15.0)
MCH: 27.2 pg (ref 26.0–34.0)
MCHC: 33.8 g/dL (ref 30.0–36.0)
MCV: 80.6 fL (ref 78.0–100.0)
Platelets: 634 10*3/uL — ABNORMAL HIGH (ref 150–400)
RBC: 3.6 MIL/uL — AB (ref 3.87–5.11)
RDW: 17 % — ABNORMAL HIGH (ref 11.5–15.5)
WBC: 17.6 10*3/uL — ABNORMAL HIGH (ref 4.0–10.5)

## 2018-01-04 MED ORDER — FAT EMULSION 30 % IV EMUL
INTRAVENOUS | Status: AC
Start: 2018-01-04 — End: 2018-01-05
  Administered 2018-01-04: 18:00:00 via INTRAVENOUS
  Filled 2018-01-04: qty 904.8

## 2018-01-04 MED ORDER — SODIUM CHLORIDE 0.9% FLUSH
10.0000 mL | INTRAVENOUS | Status: DC | PRN
  Administered 2018-01-14: 10 mL
  Administered 2018-01-15: 20 mL
  Filled 2018-01-04 (×2): qty 40

## 2018-01-04 MED ORDER — DEXTROSE 10 % IV SOLN
INTRAVENOUS | Status: AC
Start: 2018-01-04 — End: 2018-01-04
  Administered 2018-01-04: 06:00:00 via INTRAVENOUS

## 2018-01-04 MED ORDER — POTASSIUM CHLORIDE 10 MEQ/50ML IV SOLN
10.0000 meq | INTRAVENOUS | Status: AC
  Administered 2018-01-04 (×2): 10 meq via INTRAVENOUS
  Filled 2018-01-04 (×2): qty 50

## 2018-01-04 NOTE — Progress Notes (Addendum)
PHARMACY - ADULT TOTAL PARENTERAL NUTRITION CONSULT NOTE   Pharmacy Consult: TPN Indication: Intolerance to enteral feeding  Patient Measurements: Height: '5\' 5"'  (165.1 cm)(12/04/17) Weight: 138 lb 14.2 oz (63 kg) IBW/kg (Calculated) : 57 TPN AdjBW (KG): 57.6 Body mass index is 23.11 kg/m.  Assessment:  41  YOM with stage IV neuroendocrine small bowel cancer s/p ex-lap with SBR in December.  Patient was re-admitted from SNF after she began having nausea and vomiting and unable to tolerate enteral feeding.  Found to have a large dilated segment of bowel with bezoar and necrosis, now s/p SBR x 2.  Pharmacy managing TPN for nutritional support.  1/18: PICC was pulled out by patient at 5:30 AM and TPN was discontinued by IV team. A peripheral line was placed with D10 running at 65 mL/hr. Plan to re-insert PICC today.  GI: prealbumin down to 5.3, LBM x3 per day - IV PPI, PRN Zofran/Phenergan -Per Dr. Barry Dienes note 1/18 - plan to keep NPO/TPN until Monday 1/21 Endo: DM on metformin PTA. Unsure why CBGs elevated at 190-220s on D10.  Will rely on SSI to cover for hyperglycemia instead of increasing insulin in TPN and expose pt to risk of hypoglycemia.  Insulin requirements in the past 24 hours: 14 units SSI + 25 units in TPN Lytes: Na 135, Mag low normal (max in TPN, 2gm 1/14, 4g 1/16 and 1/17, ?supplement qod), others WNL Renal: SCr 0.6 stable, BUN WNL - UOP 0.3 ml/kg/hr, net +14L since admit, LR at [place]: stable on RA Cards: BP controlled, HR elevated - ASA (plts 730 > 472, blood loss 994m on 1/3) Hepatobil: alk phos elevated, AST/ALT WNL, tbili down to 1.4.  TG WNL. Neuro: confused - Lexapro, Remeron, s/p Haldol for sundowning (QTc 4430m ID: Zosyn (1/14 >> ) for IAA - now afebrile, WBC down to 18.4 TPN Access: PICC placed 12/24/17>> pulled out 1/17; replacement 1/18 >> TPN start date: 12/25/17>>1/17;   Nutritional Goals (per RD rec on 1/10): 1500-1700 kCal and 75-90 gram protein per  day  Current Nutrition: TPN    Plan:  Restart TPN at 65 ml/hr at 1800 PM, providing 90g AA, 234g CHO, and 37g ILE, which equals to 1531 kCals per day, meeting 100% of patient needs. Stop D10 when new TPN hangs.  Electrolytes in TPN: Cl:Ac 1:2, Na increased 1/15, Mag providing 22 mEq/d (max given calcium provision) Daily multivitamin and trace elements in TPN Continue moderate Q4H + 25 units regular insulin in TPN WIll order K run x2 today with Lasix being given and off TPN most of today.  F/U K+ in AM and may order +/- 2 runs with each Lasix dose if needed.  Hold off on increasing Na in TPN.  F/U prealbumin, nutritional status to maximize kCal provision further if needed, volume status F/U AM labs   JeSloan LeiterPharmD, BCPS, BCCCP Clinical Pharmacist Clinical phone 01/04/2018 until 3:30PM- #[phone] hours, please call #[phone]/18/2019, 8:21 AM

## 2018-01-04 NOTE — Progress Notes (Signed)
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 PMHx: DM, glaucoma, HTN, neuropathy   OT comments  Pt presents supine in bed, very pleasant and willing to work with therapy. Pt with increased difficulty completing mobility and functional tasks this session, though suspect partly due to cognitive deficits vs physical. Pt requiring increased time, repetition, and multimodal cues to complete mobility and to follow one-step directions; completing sit<>stand  and taking very small side steps along EOB with MinA+2. Pt easily fatigued with activity. Feel SNF recommendation remains appropriate at this time. Will continue to follow acutely to progress her safety and independence with ADLs and mobility.   Follow Up Recommendations  SNF    Equipment Recommendations  None recommended by OT          Precautions / Restrictions Precautions Precautions: Fall Precaution Comments: Abdominial incision, sacral wound Restrictions Weight Bearing Restrictions: No       Mobility Bed Mobility Overal bed mobility: Needs Assistance Bed Mobility: Supine to Sit;Sit to Supine     Supine to sit: Max assist Sit to supine: Min assist   General bed mobility comments: MaxA to bring LEs over EOB and to bring trunk upright into sitting, feel this is moreso due to decreased cognition/difficulty following commands vs physical; assist to bring LEs onto bed when returning to supine  Transfers Overall transfer level: Needs assistance Equipment used: 2 person hand held assist Transfers: Sit to/from Stand Sit to Stand: Min assist;+2 physical assistance;+2 safety/equipment         General transfer comment: assist to rise and steady with MinA+2; stood x2 from EOB; Pt taking very small  side steps along EOB, Pt appearing to have difficulty advancing LLE, initiating returning to sitting due to fatigue     Balance Overall balance assessment: Needs assistance Sitting-balance support: Feet supported;No upper extremity supported Sitting balance-Leahy Scale: Good     Standing balance support: Bilateral upper extremity supported;During functional activity Standing balance-Leahy Scale: Poor Standing balance comment: reliant on UE support during standing                           ADL either performed or assessed with clinical judgement   ADL Overall ADL's : Needs assistance/impaired;Independent                     Lower Body Dressing: Minimal assistance;+2 for physical assistance;Sit to/from stand Lower Body Dressing Details (indicate cue type and reason): Pt doffing sock seated EOB without assist; requires assist to re-don as Pt with difficulty following commands              Functional mobility during ADLs: Minimal assistance;+2 for physical assistance General ADL Comments: Pt with difficulty following commands this session, requires increased time and multimodal cues. Pt sat EOB with MinGuard assist; attempted grooming ADLs however Pt unable to attend to task at this time, assisted NT while sitting EOB to take vitals as Pt attempting to remove equipment. Pt stood with MinA+2 and took very small side steps along EOB; Pt easily fatigued                        Cognition Arousal/Alertness: Awake/alert Behavior During Therapy: WFL for tasks assessed/performed Overall  Cognitive Status: Impaired/Different from baseline Area of Impairment: Attention;Memory;Following commands;Safety/judgement;Awareness;Problem solving                   Current Attention Level: Focused Memory: Decreased short-term memory Following Commands: Follows one step commands inconsistently;Follows one step commands with increased time Safety/Judgement: Decreased  awareness of safety;Decreased awareness of deficits Awareness: Intellectual Problem Solving: Slow processing;Decreased initiation;Difficulty sequencing;Requires verbal cues;Requires tactile cues General Comments: Pt requires max repetition and multimodal cues during session; attempted functional task completion however Pt unable to attend to task while sitting EOB, easily distracted and attempting to remove wrapping around IV line requiring redirection                          Pertinent Vitals/ Pain       Pain Assessment: Faces Faces Pain Scale: No hurt                                                          Frequency  Min 2X/week        Progress Toward Goals  OT Goals(current goals can now be found in the care plan section)  Progress towards OT goals: Progressing toward goals  Acute Rehab OT Goals Patient Stated Goal: none stated this session  OT Goal Formulation: Patient unable to participate in goal setting Time For Goal Achievement: 01/15/18 Potential to Achieve Goals: Good  Plan Discharge plan remains appropriate                    AM-PAC PT "6 Clicks" Daily Activity     Outcome Measure   Help from another person eating meals?: Total Help from another person taking care of personal grooming?: A Little Help from another person toileting, which includes using toliet, bedpan, or urinal?: A Lot Help from another person bathing (including washing, rinsing, drying)?: A Lot Help from another person to put on and taking off regular upper body clothing?: A Lot Help from another person to put on and taking off regular lower body clothing?: A Lot 6 Click Score: 12    End of Session    OT Visit Diagnosis: Unsteadiness on feet (R26.81);Cognitive communication deficit (R41.841)   Activity Tolerance Patient tolerated treatment well   Patient Left in bed;with call bell/phone within reach;with nursing/sitter in room;with bed alarm set    Nurse Communication Mobility status        Time: 1610-9604 OT Time Calculation (min): 27 min  Charges: OT General Charges $OT Visit: 1 Visit OT Treatments $Self Care/Home Management : 23-37 mins  Marcy Siren, OT Pager [phone] 01/04/2018    [name] 01/04/2018, 3:45 PM

## 2018-01-04 NOTE — Progress Notes (Signed)
Peripherally Inserted Central Catheter/Midline Placement  The IV Nurse has discussed with the patient and/or persons authorized to consent for the patient, the purpose of this procedure and the potential benefits and risks involved with this procedure.  The benefits include less needle sticks, lab draws from the catheter, and the patient may be discharged home with the catheter. Risks include, but not limited to, infection, bleeding, blood clot (thrombus formation), and puncture of an artery; nerve damage and irregular heartbeat and possibility to perform a PICC exchange if needed/ordered by physician.  Alternatives to this procedure were also discussed.  Bard Power PICC patient education guide, fact sheet on infection prevention and patient information card has been provided to patient /or left at bedside.  Consent signed 12/24/2017 at 1403.  PICC/Midline Placement Documentation  PICC Double Lumen 01/04/18 PICC Right Brachial 40 cm 1 cm (Active)  Indication for Insertion or Continuance of Line Administration of hyperosmolar/irritating solutions (i.e. TPN, Vancomycin, etc.) 01/04/2018 11:55 AM  Exposed Catheter (cm) 1 cm 01/04/2018 11:55 AM  Site Assessment Clean;Dry;Intact 01/04/2018 11:55 AM  Lumen #1 Status Flushed;Saline locked;Blood return noted 01/04/2018 11:55 AM  Lumen #2 Status Flushed;Saline locked;Blood return noted 01/04/2018 11:55 AM  Dressing Type Transparent 01/04/2018 11:55 AM  Dressing Status Clean;Dry;Intact;Antimicrobial disc in place 01/04/2018 11:55 AM  Dressing Change Due 01/11/18 01/04/2018 11:55 AM       Temima Kutsch, Nicolette Bang 01/04/2018, 11:57 AM

## 2018-01-04 NOTE — Progress Notes (Signed)
PT Cancellation Note  Patient Details Name: SHEROL SABAS MRN: [id number] DOB: 1931/11/12   Cancelled Treatment:    Reason Eval/Treat Not Completed: Patient declined, no reason specified(Chart reviewed, RN consulted. Pt sleeping upon entry, not agreeable to participate, reports she is having stomach pain. Pt appearing drowsy. Niece suspects patient may be fatigued after OT session. WIll attempt again at later date/time. )  4:06 PM, 01/04/18 Etta Grandchild, PT, DPT Relief Physical Therapist - [place] [phone] (Pager)  [phone] [facility])  [phone] (Office)     Kynnedy Carreno C 01/04/2018, 4:06 PM

## 2018-01-04 NOTE — Progress Notes (Signed)
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 Barry Dienes who returned on 01/07 with partial SBO  Partial SBO - S/P diagnostic laparoscopy, exploratory laparotomy, SBR x 2, 01/10, Dr. Barry Dienes Resolved.  Given condition of bowel overall, plan to keep NPO/TNA until Monday 1/21.  Also, would not expect her to take in >25% of caloric needs po for at least 1 week more.    FEN: TPN, NPO.  picc to be reinserted and TNA to start tonight.   VTE: SCD's, lovenox prophylaxis.   ID: low grade temps improved, but leukocytosis continued.  Recheck labs today.  Empiric zosyn for 5 days.   CV-   Stable.  Low dose haldol for sundowning.    DISPO: NPO, TPN     LOS: 11 days    Subjective:  CC: having pain this AM.  Denies nausea.  + gas/BM.    Objective: Vital signs in last 24 hours: Temp:  [98.1 F (36.7 C)-99.4 F (37.4 C)] 98.8 F (37.1 C) (01/18 0606) Pulse Rate:  [89-102] 98 (01/18 0606) Resp:  [18-26] 18 (01/18 0606) BP: (108-125)/(54-67) 116/54 (01/18 0606) SpO2:  [98 %-100 %] 100 % (01/18 0606) Last BM Date: 01/03/18  Intake/Output from previous day: 01/17 0701 - 01/18 0700 In: 810 [I.V.:760; IV Piggyback:50] Out: 500 [Urine:500] Intake/Output this shift: No intake/output data recorded.  PE: Gen:  Alert, NAD, pleasant, cooperative.  More awake than on previous exams.   Card:  RR&R Pulm:  effort normal Abd: Soft, nondistended, midline incision without purulent drainage Skin: no rashes noted, warm and dry   Anti-infectives: Anti-infectives (From admission, onward)   Start     Dose/Rate Route Frequency Ordered Stop   12/31/17 1300  piperacillin-tazobactam (ZOSYN) IVPB 3.375 g      3.375 g 12.5 mL/hr over 240 Minutes Intravenous Every 8 hours 12/31/17 1252        Lab Results:  Recent Labs    01/02/18 0500  WBC 18.4*  HGB 9.4*  HCT 28.2*  PLT 472*   BMET Recent Labs    01/03/18 0433 01/04/18 0552  NA 134* 135  K 3.8 4.4  CL 101 102  CO2 23 21*  GLUCOSE 238* 224*  BUN 16 17  CREATININE 0.70 0.60  CALCIUM 7.8* 8.2*   PT/INR No results for input(s): LABPROT, INR in the last 72 hours. CMP     Component Value Date/Time   NA 135 01/04/2018 0552   K 4.4 01/04/2018 0552   CL 102 01/04/2018 0552   CO2 21 (L) 01/04/2018 0552   GLUCOSE 224 (H) 01/04/2018 0552   BUN 17 01/04/2018 0552   CREATININE 0.60 01/04/2018 0552   CREATININE 0.97 (H) 10/05/2017 1400   CALCIUM 8.2 (L) 01/04/2018 0552   PROT 4.6 (L) 01/03/2018 0433   ALBUMIN 1.3 (L) 01/03/2018 0433   AST 22 01/03/2018 0433   ALT 18 01/03/2018 0433   ALKPHOS 216 (H) 01/03/2018 0433   BILITOT 1.4 (H) 01/03/2018 0433   GFRNONAA >60 01/04/2018 0552   GFRNONAA 54 (L) 10/05/2017 1400   GFRAA >60 01/04/2018 0552   GFRAA 62 10/05/2017 1400   Lipase     Component Value Date/Time   LIPASE 54 10/05/2017 1400    Studies/Results: No results  found.    Stark Klein , [facility] 01/04/2018, 8:45 AM

## 2018-01-04 NOTE — Progress Notes (Signed)
Called to room by IV nurse , reported that patient pulled out her PICC line , PICC line is complete no bleeding noted , TPN discontinued by IV team, new PIV line inserted started D10 at 4ml/hr, Dr. Kieth Brightly informed, for new PICC line insertion.

## 2018-01-05 LAB — BASIC METABOLIC PANEL
ANION GAP: 12 (ref 5–15)
BUN: 17 mg/dL (ref 6–20)
CO2: 22 mmol/L (ref 22–32)
Calcium: 8.3 mg/dL — ABNORMAL LOW (ref 8.9–10.3)
Chloride: 101 mmol/L (ref 101–111)
Creatinine, Ser: 0.76 mg/dL (ref 0.44–1.00)
GFR calc non Af Amer: >60 mL/min (ref >60)
GLUCOSE: 168 mg/dL — AB (ref 65–99)
POTASSIUM: 4.1 mmol/L (ref 3.5–5.1)
SODIUM: 135 mmol/L (ref 135–145)

## 2018-01-05 LAB — GLUCOSE, CAPILLARY
GLUCOSE-CAPILLARY: 147 mg/dL — AB (ref 65–99)
GLUCOSE-CAPILLARY: 162 mg/dL — AB (ref 65–99)
Glucose-Capillary: 166 mg/dL — ABNORMAL HIGH (ref 65–99)
Glucose-Capillary: 167 mg/dL — ABNORMAL HIGH (ref 65–99)
Glucose-Capillary: 185 mg/dL — ABNORMAL HIGH (ref 65–99)
Glucose-Capillary: 217 mg/dL — ABNORMAL HIGH (ref 65–99)

## 2018-01-05 LAB — CBC
HEMATOCRIT: 27.8 % — AB (ref 36.0–46.0)
HEMOGLOBIN: 9.1 g/dL — AB (ref 12.0–15.0)
MCH: 26.6 pg (ref 26.0–34.0)
MCHC: 32.7 g/dL (ref 30.0–36.0)
MCV: 81.3 fL (ref 78.0–100.0)
Platelets: 718 10*3/uL — ABNORMAL HIGH (ref 150–400)
RBC: 3.42 MIL/uL — AB (ref 3.87–5.11)
RDW: 16.7 % — ABNORMAL HIGH (ref 11.5–15.5)
WBC: 16.1 10*3/uL — AB (ref 4.0–10.5)

## 2018-01-05 MED ORDER — TRAVASOL 10 % IV SOLN
INTRAVENOUS | Status: AC
  Administered 2018-01-05: 18:00:00 via INTRAVENOUS
  Filled 2018-01-05: qty 904.8

## 2018-01-05 NOTE — Progress Notes (Signed)
Pt. Also refused Toradol, Oxycodone and Tylenol for pain. He claimed it do not relieve his pain at all.  Md notified also.

## 2018-01-05 NOTE — Progress Notes (Signed)
PHARMACY - ADULT TOTAL PARENTERAL NUTRITION CONSULT NOTE   Pharmacy Consult: TPN Indication: Intolerance to enteral feeding  Patient Measurements: Height: _0  (165.1 cm)(12/04/17) Weight: 138 lb 14.2 oz (63 kg) IBW/kg (Calculated) : 57 TPN AdjBW (KG): 57.6 Body mass index is 23.11 kg/m.  Assessment:  83  YOM with stage IV neuroendocrine small bowel cancer s/p ex-lap with SBR in December.  Patient was re-admitted from SNF after she began having nausea and vomiting and unable to tolerate enteral feeding.  Found to have a large dilated segment of bowel with bezoar and necrosis, now s/p SBR x 2.  Pharmacy managing TPN for nutritional support.  GI: prealbumin down to 5.3, LBM x3 per day - IV PPI, PRN Zofran/Phenergan -Per Dr. Barry Dienes note 1/18 - plan to keep NPO/TPN until Monday 1/21 Endo: DM on metformin PTA. Relying mostly on SSI to cover for hyperglycemia instead of increasing insulin in TPN and expose pt to risk of hypoglycemia. CBGs elevated 1/17 and 1/18 due to D10. Insulin requirements in the past 24 hours: 17 units SSI + 25 units in TPN; last 12 hours = 12 units SSI Lytes: Na 135 stable, Mag low normal (max in TPN, 2gm 1/14, 4g 1/16 and 1/17, ?supplement qod), others WNL Renal: SCr 0.76 stable, BUN WNL - UOP 0.5 ml/kg/hr, net +14.6L since admit, LR at [place]: stable on RA Cards: BP controlled, ST in 90s - ASA (plts 730 > 472, blood loss 961m on 1/3) Hepatobil: alk phos elevated, AST/ALT WNL, tbili down to 1.4.  TG WNL. Neuro: confused - Lexapro, Remeron, s/p Haldol for sundowning (QTc 4422m ID: Zosyn (1/14 >> ) for IAA - now afebrile, WBC down to 16 TPN Access: PICC placed 12/24/17>> pulled out 1/17; replacement 1/18 >> TPN start date: 12/25/17>>1/17 (lost PICC); 1/18>>  Nutritional Goals (per RD rec on 1/10): 1500-1700 kCal and 75-90 gram protein per day  Current Nutrition: TPN    Plan:  Continue TPN at 65 ml/hr, providing 90g AA, 234g CHO, and 37g ILE, which equals to 1531  kCals per day, meeting 100% of patient needs. Electrolytes in TPN: Cl:Ac 1:2, Na increased 1/15, Mag providing 22 mEq/d (max given calcium provision) Daily multivitamin and trace elements in TPN Continue moderate Q4H + 25 units regular insulin in TPN If CBGs remain elevated tomorrow, will consider small increase in insulin in TPN F/U prealbumin, nutritional status to maximize kCal provision further if needed, volume status F/U AM labs- Recheck Mg   JeSloan LeiterPharmD, BCPS, BCCCP Clinical Pharmacist Clinical phone 01/05/2018 until 3:30PM- - #[phone] hours, please call #[phone]/19/2019, 9:19 AM

## 2018-01-05 NOTE — Progress Notes (Signed)
   01/05/18 1300  Clinical Encounter Type  Visited With Patient and family together  Visit Type Social support;Spiritual support  Referral From Nurse  Consult/Referral To Chaplain  Spiritual Encounters  Spiritual Needs Literature  Stress Factors  Patient Stress Factors Health changes  Family Stress Factors Family relationships  Chaplain visited with PT and family per nurse request.  The family wanted to have an AD completed for the PT.  As per the nephew?  Mr. Alford Highland he does not want an opportunistic 2nd cousin to come and take the PT out of the hospital against her wishes.  The PT has a brother that would be next of kin and nurse will add his contact info to the chart.  The primary reason for the HCPOA is to make sure that the proper people in the family are contacted regarding medical decisions if the PT is not able to do so herself.    Chaplain said that HCPOA can be completed on Monday AM

## 2018-01-05 NOTE — Progress Notes (Signed)
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 PMHx: DM, glaucoma, HTN, neuropathy    PT Comments    Patient oriented to person and stated "they're fixing my stomach". Pt tolerated short distance gait in room this session and fatigued with ambulating to bathroom. Continue to progress as tolerated with anticipated d/c to SNF for further skilled PT services.     Follow Up Recommendations  SNF;Supervision/Assistance - 24 hour     Equipment Recommendations  Rolling walker with 5" wheels;3in1 (PT)    Recommendations for Other Services       Precautions / Restrictions Precautions Precautions: Fall Precaution Comments: Abdominial incision, sacral wound Restrictions Weight Bearing Restrictions: No    Mobility  Bed Mobility Overal bed mobility: Needs Assistance Bed Mobility: Supine to Sit     Supine to sit: Mod assist     General bed mobility comments: assist to bring bilat LE to EOB and to elevate trunk into sitting; cues for sequencing; pt completed task once initiated by therapist  Transfers Overall transfer level: Needs assistance Equipment used: Rolling walker (2 wheeled);1 person hand held assist Transfers: Sit to/from Stand Sit to Stand: Min assist;Mod assist Stand pivot transfers: Min assist       General transfer comment: min A from EOB and mod A from commode wtih use of grab bar; min A for stand pivot commode to recliner with HHA  Ambulation/Gait Ambulation/Gait assistance: Min assist;Mod assist Ambulation Distance (Feet): 12 Feet Assistive device: Rolling walker (2 wheeled) Gait Pattern/deviations: Step-through pattern;Decreased stride length;Trunk flexed;Narrow base of support Gait velocity: Decreased   General Gait Details: cues  for safe use of AD and posture; assist to manage RW and for balance; pt had difficulty navigating around objects in room   Stairs            Wheelchair Mobility    Modified Rankin (Stroke Patients Only)       Balance Overall balance assessment: Needs assistance Sitting-balance support: Feet supported;No upper extremity supported Sitting balance-Leahy Scale: Good     Standing balance support: Bilateral upper extremity supported;During functional activity Standing balance-Leahy Scale: Poor Standing balance comment: reliant on UE support during standing                            Cognition Arousal/Alertness: Awake/alert Behavior During Therapy: WFL for tasks assessed/performed Overall Cognitive Status: Impaired/Different from baseline Area of Impairment: Attention;Memory;Following commands;Safety/judgement;Awareness;Problem solving                 Orientation Level: Disoriented to;Place;Time(stated "they're fixing my stomach") Current Attention Level: Sustained Memory: Decreased short-term memory Following Commands: Follows one step commands inconsistently;Follows one step commands with increased time Safety/Judgement: Decreased awareness of safety;Decreased awareness of deficits Awareness: Intellectual Problem Solving: Slow processing;Decreased initiation;Difficulty sequencing;Requires verbal cues;Requires tactile cues General Comments: needs almost constant redirection to tasks      Exercises      General Comments        Pertinent Vitals/Pain Pain Assessment: Faces Faces Pain Scale: Hurts little more Pain Location: Abdomen Pain Descriptors / Indicators: Grimacing;Guarding Pain Intervention(s): Limited activity within patient's tolerance;Monitored during session;Repositioned;Premedicated before session    Home Living  Prior Function            PT Goals (current goals can now be found in the care plan  section) Acute Rehab PT Goals PT Goal Formulation: With patient Time For Goal Achievement: 01/13/18 Potential to Achieve Goals: Good Progress towards PT goals: Progressing toward goals    Frequency    Min 2X/week      PT Plan Current plan remains appropriate    Co-evaluation              AM-PAC PT "6 Clicks" Daily Activity  Outcome Measure  Difficulty turning over in bed (including adjusting bedclothes, sheets and blankets)?: A Little Difficulty moving from lying on back to sitting on the side of the bed? : Unable Difficulty sitting down on and standing up from a chair with arms (e.g., wheelchair, bedside commode, etc,.)?: Unable Help needed moving to and from a bed to chair (including a wheelchair)?: A Little Help needed walking in hospital room?: A Little Help needed climbing 3-5 steps with a railing? : A Lot 6 Click Score: 13    End of Session Equipment Utilized During Treatment: Gait belt Activity Tolerance: Patient limited by fatigue Patient left: with call bell/phone within reach;in chair;with chair alarm set Nurse Communication: Mobility status PT Visit Diagnosis: Other abnormalities of gait and mobility (R26.89);Muscle weakness (generalized) (M62.81) Pain - part of body: (abdomen)     Time: 6237-6283 PT Time Calculation (min) (ACUTE ONLY): 32 min  Charges:  $Gait Training: 8-22 mins $Therapeutic Activity: 8-22 mins                    G Codes:       Erline Levine, PTA Pager: [phone]     Carolynne Edouard 01/05/2018, 12:10 PM

## 2018-01-05 NOTE — Progress Notes (Signed)
9 Days Post-Op   Subjective/Chief Complaint: Pt with NAE. Denies bowel function   Objective: Vital signs in last 24 hours: Temp:  [97.8 F (36.6 C)-99.7 F (37.6 C)] 97.8 F (36.6 C) (01/19 0534) Pulse Rate:  [98-111] 98 (01/19 0534) Resp:  [18-20] 19 (01/19 0534) BP: (111-144)/(53-99) 116/53 (01/19 0534) SpO2:  [97 %-100 %] 97 % (01/19 0534) Last BM Date: 01/04/18  Intake/Output from previous day: 01/18 0701 - 01/19 0700 In: 828.2 [I.V.:678.2; IV Piggyback:150] Out: 825 [Urine:825] Intake/Output this shift: No intake/output data recorded.  General appearance: alert and cooperative GI: soft, nttp, nd, incision c/d/i  Lab Results:  Recent Labs    01/04/18 0552 01/05/18 0355  WBC 17.6* 16.1*  HGB 9.8* 9.1*  HCT 29.0* 27.8*  PLT 634* 718*   BMET Recent Labs    01/04/18 0552 01/05/18 0355  NA 135 135  K 4.4 4.1  CL 102 101  CO2 21* 22  GLUCOSE 224* 168*  BUN 17 17  CREATININE 0.60 0.76  CALCIUM 8.2* 8.3*   PT/INR No results for input(s): LABPROT, INR in the last 72 hours. ABG No results for input(s): PHART, HCO3 in the last 72 hours.  Invalid input(s): PCO2, PO2  Studies/Results: Dg Chest Port 1 View  Result Date: 01/04/2018 CLINICAL DATA:  Right-sided PICC line placement. Small bowel resection. EXAM: PORTABLE CHEST 1 VIEW COMPARISON:  09/25/2017 FINDINGS: The heart size and mediastinal contours are within normal limits. Right arm PICC line extends to the SVC/RA junction. Lungs show low volumes with bibasilar atelectasis. There is no evidence of pulmonary edema, consolidation, pneumothorax, nodule or pleural fluid. The visualized skeletal structures are unremarkable. IMPRESSION: PICC line tip lies at the SVC/RA junction. Electronically Signed   By: Aletta Edouard M.D.   On: 01/04/2018 12:34    Anti-infectives: Anti-infectives (From admission, onward)   Start     Dose/Rate Route Frequency Ordered Stop   12/31/17 1300  piperacillin-tazobactam (ZOSYN)  IVPB 3.375 g     3.375 g 12.5 mL/hr over 240 Minutes Intravenous Every 8 hours 12/31/17 1252        Assessment/Plan: s/p Procedure(s): DIAGNOSTIC LAPAROSCOPY/ LAPAROTOMY WITH SMALL BOWEL RESECTION TIMES TWO (N/A) con't TNA/NGT Mobilize with tx Cont' abx   LOS: 12 days    Rosario Jacks., Anne Hahn 01/05/2018

## 2018-01-06 LAB — BASIC METABOLIC PANEL
ANION GAP: 11 (ref 5–15)
BUN: 22 mg/dL — AB (ref 6–20)
CHLORIDE: 103 mmol/L (ref 101–111)
CO2: 24 mmol/L (ref 22–32)
Calcium: 8.5 mg/dL — ABNORMAL LOW (ref 8.9–10.3)
Creatinine, Ser: 0.71 mg/dL (ref 0.44–1.00)
Glucose, Bld: 229 mg/dL — ABNORMAL HIGH (ref 65–99)
POTASSIUM: 3.7 mmol/L (ref 3.5–5.1)
SODIUM: 138 mmol/L (ref 135–145)

## 2018-01-06 LAB — GLUCOSE, CAPILLARY
GLUCOSE-CAPILLARY: 137 mg/dL — AB (ref 65–99)
GLUCOSE-CAPILLARY: 154 mg/dL — AB (ref 65–99)
GLUCOSE-CAPILLARY: 170 mg/dL — AB (ref 65–99)
GLUCOSE-CAPILLARY: 195 mg/dL — AB (ref 65–99)
GLUCOSE-CAPILLARY: 215 mg/dL — AB (ref 65–99)
GLUCOSE-CAPILLARY: 93 mg/dL (ref 65–99)

## 2018-01-06 LAB — CBC
HEMATOCRIT: 29.5 % — AB (ref 36.0–46.0)
HEMOGLOBIN: 9.5 g/dL — AB (ref 12.0–15.0)
MCH: 26.5 pg (ref 26.0–34.0)
MCHC: 32.2 g/dL (ref 30.0–36.0)
MCV: 82.4 fL (ref 78.0–100.0)
Platelets: 852 10*3/uL — ABNORMAL HIGH (ref 150–400)
RBC: 3.58 MIL/uL — AB (ref 3.87–5.11)
RDW: 17.2 % — ABNORMAL HIGH (ref 11.5–15.5)
WBC: 14.6 10*3/uL — AB (ref 4.0–10.5)

## 2018-01-06 LAB — MAGNESIUM: Magnesium: 1.9 mg/dL (ref 1.7–2.4)

## 2018-01-06 MED ORDER — TRAVASOL 10 % IV SOLN
INTRAVENOUS | Status: AC
  Administered 2018-01-06: 17:00:00 via INTRAVENOUS
  Filled 2018-01-06: qty 904.8

## 2018-01-06 NOTE — Progress Notes (Signed)
10 Days Post-Op   Subjective/Chief Complaint: Pt with NAE.  +Flatus and BM this morning. Emesis x1 overnight.   Objective: Vital signs in last 24 hours: Temp:  [97.9 F (36.6 C)] 97.9 F (36.6 C) (01/20 0443) Pulse Rate:  [84-85] 85 (01/20 0443) Resp:  [18] 18 (01/20 0443) BP: (110-114)/(54-55) 110/54 (01/20 0443) SpO2:  [96 %] 96 % (01/20 0443) Last BM Date: 01/05/18  Intake/Output from previous day: 01/19 0701 - 01/20 0700 In: 739 [I.V.:689; IV Piggyback:50] Out: 400 [Urine:400] Intake/Output this shift: No intake/output data recorded.  General appearance: alert and cooperative GI: soft, nt, nd, incision - skin open; wound bed with scant fibrinous exudate but granulating. Wet to dry changed.  Lab Results:  Recent Labs    01/05/18 0355 01/06/18 0458  WBC 16.1* 14.6*  HGB 9.1* 9.5*  HCT 27.8* 29.5*  PLT 718* 852*   BMET Recent Labs    01/05/18 0355 01/06/18 0458  NA 135 138  K 4.1 3.7  CL 101 103  CO2 22 24  GLUCOSE 168* 229*  BUN 17 22*  CREATININE 0.76 0.71  CALCIUM 8.3* 8.5*   Studies/Results: Dg Chest Port 1 View  Result Date: 01/04/2018 CLINICAL DATA:  Right-sided PICC line placement. Small bowel resection. EXAM: PORTABLE CHEST 1 VIEW COMPARISON:  09/25/2017 FINDINGS: The heart size and mediastinal contours are within normal limits. Right arm PICC line extends to the SVC/RA junction. Lungs show low volumes with bibasilar atelectasis. There is no evidence of pulmonary edema, consolidation, pneumothorax, nodule or pleural fluid. The visualized skeletal structures are unremarkable. IMPRESSION: PICC line tip lies at the SVC/RA junction. Electronically Signed   By: Aletta Edouard M.D.   On: 01/04/2018 12:34    Anti-infectives: Anti-infectives (From admission, onward)   Start     Dose/Rate Route Frequency Ordered Stop   12/31/17 1300  piperacillin-tazobactam (ZOSYN) IVPB 3.375 g     3.375 g 12.5 mL/hr over 240 Minutes Intravenous Every 8 hours 12/31/17  1252        Assessment/Plan: s/p Procedure(s): DIAGNOSTIC LAPAROSCOPY/ LAPAROTOMY WITH SMALL BOWEL RESECTION TIMES TWO (N/A)  Possible anastomotic leak on CT 1/7 Clinically doing well Continue TPN Mobilize with tx WBC down-trending   LOS: 13 days   Ileana Roup 01/06/2018

## 2018-01-06 NOTE — Progress Notes (Signed)
PHARMACY - ADULT TOTAL PARENTERAL NUTRITION CONSULT NOTE   Pharmacy Consult: TPN Indication: Intolerance to enteral feeding  Patient Measurements: Height: '5\' 5"'  (165.1 cm)(12/04/17) Weight: 138 lb 14.2 oz (63 kg) IBW/kg (Calculated) : 57 TPN AdjBW (KG): 57.6 Body mass index is 23.11 kg/m.  Assessment:  [name] with stage IV neuroendocrine small bowel cancer s/p ex-lap with SBR in December.  Patient was re-admitted from SNF after she began having nausea and vomiting and unable to tolerate enteral feeding.  Found to have a large dilated segment of bowel with bezoar and necrosis, now s/p SBR x 2.  Pharmacy managing TPN for nutritional support.  GI: prealbumin down to 5.3, LBM x3 per day - IV PPI, PRN Zofran/Phenergan -Per Dr. Barry Dienes note 1/18 - plan to keep NPO/TPN until Monday 1/21. Endo: DM on metformin PTA. Relying mostly on SSI to cover for hyperglycemia instead of increasing insulin in TPN and expose pt to risk of hypoglycemia.  Insulin requirements in the past 24 hours: 20 units SSI + 25 units in TPN Lytes: Na 138 stable, Mag low normal (max in TPN, 2gm 1/14, 4g 1/16 and 1/17), others WNL Renal: SCr 0.71 stable, BUN up 22 - UOP 0.5 ml/kg/hr, net +14.6L since admit, LR at [place]: stable on RA Cards: BP controlled, ST in 90s - ASA (plts 730 > 472, blood loss 973m on 1/3) Hepatobil: alk phos elevated, AST/ALT WNL, tbili down to 1.4.  TG WNL. Neuro: confused - Lexapro, Remeron, s/p Haldol for sundowning (QTc 4435m ID: Zosyn (1/14 >> ) for IAA - afebrile, WBC down to 14.6 TPN Access: PICC placed 12/24/17>> pulled out 1/17; replacement 1/18 >> TPN start date: 12/25/17>>1/17 (lost PICC); 1/18>>  Nutritional Goals (per RD rec on 1/16): 1500-1700 kCal and 75-90 gram protein per day  Current Nutrition: TPN    Plan:  Continue TPN at 65 ml/hr, providing 90g AA, 234g CHO, and 37g ILE, which equals to 1531 kCals per day, meeting 100% of patient needs. Electrolytes in TPN: Cl:Ac 1:2, Na  increased 1/15, Mag providing 22 mEq/d (max given calcium provision) Daily multivitamin and trace elements in TPN Continue moderate Q4H + increase to 30 units regular insulin in TPN F/U prealbumin, nutritional status to maximize kCal provision further if needed, volume status F/U AM labs  Recommend stop Zosyn as day #6 of therapy - longer than recommended 5 days.    JeSloan LeiterPharmD, BCPS, BCCCP Clinical Pharmacist Clinical phone 01/06/2018 until 3:30PM- [phone] hours, please call #[phone] 01/06/2018, 8:02 AM

## 2018-01-07 ENCOUNTER — Inpatient Hospital Stay (HOSPITAL_COMMUNITY): Payer: Medicare Other

## 2018-01-07 DIAGNOSIS — R718 Other abnormality of red blood cells: Secondary | ICD-10-CM | POA: Diagnosis not present

## 2018-01-07 DIAGNOSIS — D473 Essential (hemorrhagic) thrombocythemia: Secondary | ICD-10-CM | POA: Diagnosis not present

## 2018-01-07 LAB — COMPREHENSIVE METABOLIC PANEL
ALT: 34 U/L (ref 14–54)
AST: 37 U/L (ref 15–41)
Albumin: 1.5 g/dL — ABNORMAL LOW (ref 3.5–5.0)
Alkaline Phosphatase: 234 U/L — ABNORMAL HIGH (ref 38–126)
Anion gap: 11 (ref 5–15)
BUN: 26 mg/dL — AB (ref 6–20)
CALCIUM: 8.5 mg/dL — AB (ref 8.9–10.3)
CHLORIDE: 105 mmol/L (ref 101–111)
CO2: 25 mmol/L (ref 22–32)
CREATININE: 0.74 mg/dL (ref 0.44–1.00)
Glucose, Bld: 140 mg/dL — ABNORMAL HIGH (ref 65–99)
Potassium: 3.9 mmol/L (ref 3.5–5.1)
Sodium: 141 mmol/L (ref 135–145)
TOTAL PROTEIN: 5.5 g/dL — AB (ref 6.5–8.1)
Total Bilirubin: 1.1 mg/dL (ref 0.3–1.2)

## 2018-01-07 LAB — CBC
HCT: 27.1 % — ABNORMAL LOW (ref 36.0–46.0)
HEMOGLOBIN: 8.5 g/dL — AB (ref 12.0–15.0)
MCH: 26 pg (ref 26.0–34.0)
MCHC: 31.4 g/dL (ref 30.0–36.0)
MCV: 82.9 fL (ref 78.0–100.0)
PLATELETS: 904 10*3/uL — AB (ref 150–400)
RBC: 3.27 MIL/uL — ABNORMAL LOW (ref 3.87–5.11)
RDW: 17.2 % — AB (ref 11.5–15.5)
WBC: 12.8 10*3/uL — ABNORMAL HIGH (ref 4.0–10.5)

## 2018-01-07 LAB — DIFFERENTIAL
BASOS ABS: 0.1 10*3/uL (ref 0.0–0.1)
BASOS PCT: 1 %
EOS ABS: 0.4 10*3/uL (ref 0.0–0.7)
EOS PCT: 3 %
Lymphocytes Relative: 5 %
Lymphs Abs: 0.7 10*3/uL (ref 0.7–4.0)
Monocytes Absolute: 0.9 10*3/uL (ref 0.1–1.0)
Monocytes Relative: 7 %
NEUTROS PCT: 84 %
Neutro Abs: 10.7 10*3/uL — ABNORMAL HIGH (ref 1.7–7.7)

## 2018-01-07 LAB — TRIGLYCERIDES: TRIGLYCERIDES: 133 mg/dL (ref <150)

## 2018-01-07 LAB — GLUCOSE, CAPILLARY
GLUCOSE-CAPILLARY: 126 mg/dL — AB (ref 65–99)
GLUCOSE-CAPILLARY: 144 mg/dL — AB (ref 65–99)
Glucose-Capillary: 162 mg/dL — ABNORMAL HIGH (ref 65–99)
Glucose-Capillary: 142 mg/dL — ABNORMAL HIGH (ref 65–99)
Glucose-Capillary: 121 mg/dL — ABNORMAL HIGH (ref 65–99)

## 2018-01-07 LAB — PHOSPHORUS: PHOSPHORUS: 3.3 mg/dL (ref 2.5–4.6)

## 2018-01-07 LAB — PREALBUMIN: PREALBUMIN: 15 mg/dL — AB (ref 18–38)

## 2018-01-07 LAB — MAGNESIUM: Magnesium: 2 mg/dL (ref 1.7–2.4)

## 2018-01-07 MED ORDER — IOPAMIDOL (ISOVUE-300) INJECTION 61%
INTRAVENOUS | Status: AC
  Administered 2018-01-07: 100 mL
  Filled 2018-01-07: qty 100

## 2018-01-07 MED ORDER — TRAVASOL 10 % IV SOLN
INTRAVENOUS | Status: AC
  Administered 2018-01-07: 17:00:00 via INTRAVENOUS
  Filled 2018-01-07: qty 904.8

## 2018-01-07 NOTE — Progress Notes (Signed)
PHARMACY - ADULT TOTAL PARENTERAL NUTRITION CONSULT NOTE   Pharmacy Consult: TPN Indication: Intolerance to enteral feeding  Patient Measurements: Height: 5' 5" (165.1 cm)(12/04/17) Weight: 138 lb 14.2 oz (63 kg) IBW/kg (Calculated) : 57 TPN AdjBW (KG): 57.6 Body mass index is 23.11 kg/m.  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  Patient was re-admitted from SNF after she began having nausea and vomiting and unable to tolerate enteral feeding.  Found to have a large dilated segment of bowel with bezoar and necrosis, now s/p SBR x 2.  Pharmacy managing TPN for nutritional support.  GI: prealbumin 5.3 >> 15, LBM 1/20 - IV PPI, PRN Zofran/Phenergan Endo: DM on metformin PTA. CBGs variable - relying mostly on SSI to cover for hyperglycemia instead of increasing insulin in TPN and expose pt to risk of hypoglycemia.  Insulin requirements in the past 24 hours: 18 units SSI + 30 units in TPN Lytes: all WNL Renal: SCr 0.74 stable, BUN up 26 - UOP 0.1 ml/kg/hr, net +16.6L since admit, LR at [place]: stable on RA Cards: VSS - ASA Hepatobil: alk phos elevated, AST/ALT/tbili WNL.  TG WNL. Neuro: confused - Lexapro, Remeron, s/p Haldol for sundowning (QTc 439m) ID: Zosyn (1/14 >> ) for IAA - afebrile, WBC down to 12.8 TPN Access: PICC placed 12/24/17>> pulled out 1/17; replaced 01/04/18 TPN start date: 12/25/17>>1/17 (lost PICC); 1/18>>  Nutritional Goals (per RD rec on 1/16): 1500-1700 kCal and 75-90 gram protein per day  Current Nutrition: TPN    Plan:  Continue TPN at 65 ml/hr, providing 90g AA, 234g CHO, and 37g ILE, which equals to 1532 kCals per day, meeting 100% of patient needs. Electrolytes in TPN: Cl:Ac 1:2, Na increased 1/15, Mag providing 22 mEq/d (max given calcium provision) Daily multivitamin and trace elements in TPN Continue moderate Q4H + 30 units regular insulin in TPN F/U for possibility of starting EN / oral  diet  Recommend stop Zosyn as day #7 of therapy - longer than recommended 5 days.    Galan Ghee D. DMina Marble PharmD, BCPS Pager:  [phone]/21/2019, 8:35 AM

## 2018-01-07 NOTE — Progress Notes (Signed)
11 Days Post-Op   Subjective/Chief Complaint: Pt denies nausea, but says her belly feels "so so."  C/o headache.  Pt is picking at stuff when nobody is here.     Objective: Vital signs in last 24 hours: Temp:  [97.9 F (36.6 C)-98.1 F (36.7 C)] 97.9 F (36.6 C) (01/21 0628) Pulse Rate:  [86-101] 86 (01/20 2025) Resp:  [17] 17 (01/20 2025) BP: (119-122)/(62-69) 119/62 (01/21 0628) SpO2:  [97 %-98 %] 97 % (01/21 0628) Last BM Date: 01/07/18  Intake/Output from previous day: 01/20 0701 - 01/21 0700 In: 1834.3 [P.O.:60; I.V.:1674.3; IV Piggyback:100] Out: 150 [Urine:150] Intake/Output this shift: No intake/output data recorded.  General appearance: alert and cooperative, but looks weary.   GI: soft, nt, nd, incision - c/d/i.  Lab Results:  Recent Labs    01/06/18 0458 01/07/18 0414  WBC 14.6* 12.8*  HGB 9.5* 8.5*  HCT 29.5* 27.1*  PLT 852* 904*   BMET Recent Labs    01/06/18 0458 01/07/18 0414  NA 138 141  K 3.7 3.9  CL 103 105  CO2 24 25  GLUCOSE 229* 140*  BUN 22* 26*  CREATININE 0.71 0.74  CALCIUM 8.5* 8.5*   Studies/Results: No results found.  Anti-infectives: Anti-infectives (From admission, onward)   Start     Dose/Rate Route Frequency Ordered Stop   12/31/17 1300  piperacillin-tazobactam (ZOSYN) IVPB 3.375 g     3.375 g 12.5 mL/hr over 240 Minutes Intravenous Every 8 hours 12/31/17 1252        Assessment/Plan: s/p Procedure(s): DIAGNOSTIC LAPAROSCOPY/ LAPAROTOMY WITH SMALL BOWEL RESECTION TIMES TWO (N/A)  Repeat Ct today prior to starting oral intake given n/v.   Clinically very slow improvement.   Continue TPN Mobilize with tx WBC down-trending Severe protein calorie malnutrition.     LOS: 14 days   Stark Klein 01/07/2018

## 2018-01-07 NOTE — Progress Notes (Signed)
Nutrition Follow-up  DOCUMENTATION CODES:   Severe malnutrition in context of chronic illness  INTERVENTION:   -TPN management per pharmacy -RD will follow for diet advancement and supplement as appropriate  NUTRITION DIAGNOSIS:   Severe Malnutrition related to chronic illness(stage IV neuroendocrine tumor) as evidenced by energy intake < 75% for > or equal to 1 month, severe fat depletion, severe muscle depletion.  Ongoing  GOAL:   Patient will meet greater than or equal to 90% of their needs  Met with TPN  MONITOR:   Diet advancement, Labs, Weight trends, I & O's, Skin, Other (Comment)(TPN)  REASON FOR ASSESSMENT:   Malnutrition Screening Tool, Consult New TPN/TNA  ASSESSMENT:   Patient is a 84 year old female who is status post laparoscopic assisted small bowel resection in mid December.  She was discharged to a skilled nursing facility.  She was doing better, but now is throwing up almost everything that she eats.  She is able to keep down a small amount of Ensure.  She also continues to have some postop pain.  She denies fevers and chills.  She is having difficulty working with physical therapy due to weakness.  She is also having difficulty taking her medication because of nausea and vomiting.  1/7- PICC placed 1/10- s/p Diagnostic laparoscopy, exploratory laparotomy, small bowel resection x 2.   1/11- NGT d/c 1/12- NGT reinserted, d/c  Pt remains NPO, on TPN. Per pharmacy notes, plan to TPN at 65 ml/hr, providing 90g AA, 234g CHO, and 37g ILE, which equals to 1532 kCals per day, meeting 100% of patient needs.  Labs reviewed: CBGS: 93-162 (inaptient orders for glycemic control are 0-15 units insulin aspart every 4 hours)  Diet Order:  Diet NPO time specified Except for: Sips with Meds TPN ADULT (ION) TPN ADULT (ION)  EDUCATION NEEDS:   Education needs have been addressed  Skin:  Skin Assessment: Skin Integrity Issues: Skin Integrity Issues:: Stage II,  Incisions, Stage III Stage II: buttocks Stage III: coccyx Incisions: abdomen  Last BM:  01/06/18  Height:   Ht Readings from Last 1 Encounters:  12/24/17 _0  (1.651 m)    Weight:   Wt Readings from Last 1 Encounters:  12/31/17 138 lb 14.2 oz (63 kg)    Ideal Body Weight:  56.8 kg  BMI:  Body mass index is 23.11 kg/m.  Estimated Nutritional Needs:   Kcal:  1500-1700  Protein:  75-90 grams  Fluid:  > 1.5 L    Desare Duddy A. Jimmye Norman, RD, LDN, CDE Pager: [phone] After hours Pager: [phone]

## 2018-01-07 NOTE — Progress Notes (Signed)
Chaplain spent significant time in [employer] with clear-headed and deeply caring family members whose interest are entirely centered around the Pt.'s wellness.  Thayer Jew [phone] who is the patient's nephew, is also a pharmacist, has been a part of her care for many years.   The presenting Spiritual Care and clinical need is to ensure that peripheral family members who do not have good intentions and are motivated by fiscal gain do not impact the care plan / significant health care decisions.  It is my assessment that there are clear thinking, self-sustaining adults in the family system that will not allow for decisions that are not grounded in the patient's best interest to be made.

## 2018-01-08 ENCOUNTER — Encounter (HOSPITAL_COMMUNITY): Payer: Self-pay | Admitting: General Surgery

## 2018-01-08 ENCOUNTER — Ambulatory Visit: Payer: Medicare Other | Admitting: Oncology

## 2018-01-08 LAB — GLUCOSE, CAPILLARY
GLUCOSE-CAPILLARY: 108 mg/dL — AB (ref 65–99)
GLUCOSE-CAPILLARY: 129 mg/dL — AB (ref 65–99)
GLUCOSE-CAPILLARY: 124 mg/dL — AB (ref 65–99)
Glucose-Capillary: 137 mg/dL — ABNORMAL HIGH (ref 65–99)
Glucose-Capillary: 143 mg/dL — ABNORMAL HIGH (ref 65–99)
Glucose-Capillary: 109 mg/dL — ABNORMAL HIGH (ref 65–99)

## 2018-01-08 LAB — CBC
HCT: 26.9 % — ABNORMAL LOW (ref 36.0–46.0)
Hemoglobin: 8.5 g/dL — ABNORMAL LOW (ref 12.0–15.0)
MCH: 26.6 pg (ref 26.0–34.0)
MCHC: 31.6 g/dL (ref 30.0–36.0)
MCV: 84.1 fL (ref 78.0–100.0)
Platelets: 955 10*3/uL (ref 150–400)
RBC: 3.2 MIL/uL — ABNORMAL LOW (ref 3.87–5.11)
RDW: 17.8 % — ABNORMAL HIGH (ref 11.5–15.5)
WBC: 11.2 10*3/uL — ABNORMAL HIGH (ref 4.0–10.5)

## 2018-01-08 LAB — PROTIME-INR
INR: 1.19
Prothrombin Time: 15 seconds (ref 11.4–15.2)

## 2018-01-08 MED ORDER — ENOXAPARIN SODIUM 60 MG/0.6ML ~~LOC~~ SOLN: 60.0000 mg | Freq: Once | SUBCUTANEOUS | Status: DC

## 2018-01-08 MED ORDER — TRAVASOL 10 % IV SOLN
INTRAVENOUS | Status: AC
  Administered 2018-01-08: 17:00:00 via INTRAVENOUS
  Filled 2018-01-08: qty 904.8

## 2018-01-08 MED ORDER — ENOXAPARIN SODIUM 40 MG/0.4ML ~~LOC~~ SOLN: 40.0000 mg | SUBCUTANEOUS | Status: DC

## 2018-01-08 NOTE — Progress Notes (Addendum)
PHARMACY - ADULT TOTAL PARENTERAL NUTRITION CONSULT NOTE   Pharmacy Consult: TPN Indication: Intolerance to enteral feeding  Patient Measurements: Height: '5\' 5"'  (165.1 cm)(12/04/17) Weight: 138 lb 14.2 oz (63 kg) IBW/kg (Calculated) : 57 TPN AdjBW (KG): 57.6 Body mass index is 23.11 kg/m.  Assessment:  Theresa Howe with stage IV neuroendocrine small bowel cancer s/p ex-lap with SBR in December.  Patient was re-admitted from SNF after she began having nausea and vomiting and unable to tolerate enteral feeding.  Found to have a large dilated segment of bowel with bezoar and necrosis, now s/p SBR x 2.  Pharmacy managing TPN for nutritional support.  GI: prealbumin 5.3 >> 15, LBM 1/20 - IV PPI, PRN Zofran/Phenergan.  Possible choledocholithiasis on CT Endo: DM on metformin PTA. CBGs has been variable, well controlled today.  Relying mostly on SSI to cover for hyperglycemia instead of increasing insulin in TPN and expose pt to risk of hypoglycemia.  Insulin requirements in the past 24 hours: 14 units SSI + 30 units in TPN Lytes: all WNL on 1/21 Renal: 1/21 labs - SCr 0.74 stable, BUN up 26 - UOP 0.7 ml/kg/hr, net +15.5L since admit, LR at [place]: stable on RA Cards: VSS - ASA AC: acute nonocclusive DVT on 1/21 CT Hepatobil: alk phos elevated, AST/ALT/tbili WNL.  TG WNL. Neuro: confused - Lexapro, Remeron, s/p Haldol for sundowning (QTc 475m) ID: Zosyn (1/14 >> ) for IAI. 1/21 CT showed abscess - afebrile, WBC down to 11.2 TPN Access: PICC placed 12/24/17>> pulled out 1/17; replaced 01/04/18 TPN start date: 12/25/17>>1/17 (lost PICC); 01/04/18  Nutritional Goals (per RD rec on 1/21): 1500-1700 kCal and 75-90 gram protein per day  Current Nutrition: TPN    Plan:  Continue TPN at 65 ml/hr, providing 90g AA, 234g CHO, and 37g ILE, which equals to 1532 kCals per day, meeting 100% of patient needs. Electrolytes in TPN: Cl:Ac 1:2, Na increased 1/15, Mag providing 22 mEq/d (max given calcium  provision) Daily multivitamin and trace elements in TPN Continue moderate Q4H + 30 units regular insulin in TPN F/U for possibility of starting EN / oral diet F/U post IR to start full-dose Lovenox for new DVT   Johathan Province D. DMina Marble PharmD, BCPS Pager:  [phone]/22/2019, 10:23 AM   ====================================   Addendum: Unable to contact family to get consent for drain placement, so hold off on starting Lovenox per MD order Pharmacy will follow along   Salaya Holtrop D. DMina Marble PharmD, BCPS Pager:  [phone]/22/2019, 3:39 PM

## 2018-01-08 NOTE — Progress Notes (Signed)
PT Cancellation Note  Patient Details Name: Theresa Howe MRN: [id number] DOB: [birth year]   Cancelled Treatment:    Reason Eval/Treat Not Completed: Patient declined, no reason specified Patient declined mobility despite max encouragement. Pt oriented to person only and reported "my husband will be here soon" when attempting to change pt's soiled underwear. RN notified. PT will continue to follow acutely.    Salina April, PTA Pager: [phone]   01/08/2018, 2:33 PM

## 2018-01-08 NOTE — Consult Note (Signed)
Chief Complaint: intra-abdominal fluid collection  Referring Physician:Dr. Stark Klein  Supervising Physician: Marybelle Killings  Patient Status: [name]  HPI: Theresa Howe is a 84 y.o. female with DM, PCM, HTN, with a history of a neuroendocrine tumore.  She underwent SBR in mid December.  She presented back to the hospital earlier this month due to a PSBO.  She was eventually taken to the OR where she was found to have a bezoar causing a partial obstruction.  She is recovering very slowly with PCM on TNA and some nausea and vomiting.  A CT was performed which revealed a femoral DVT as well as an intra-abdominal fluid collection.  IR has been consulted for drain placement.  Past Medical History:  Past Medical History:  Diagnosis Date  . Abdominal mass 10/2017   SMALL BOWEL  . Diabetes mellitus    type 2  . Dyspnea   . Failure to thrive (0-17) 12/24/2017  . Glaucoma   . Hyperlipidemia   . Hypertension   . Neuropathy   . Osteoporosis   . Wheezing     Past Surgical History:  Past Surgical History:  Procedure Laterality Date  . ABDOMINAL HYSTERECTOMY    . CATARACT EXTRACTION Right 01/2010  . COLON SURGERY    . COLONOSCOPY    . EUS N/A 04/17/2013   Procedure: UPPER ENDOSCOPIC ULTRASOUND (EUS) LINEAR;  Surgeon: Milus Banister, MD;  Location: [facility];  Service: Endoscopy;  Laterality: N/A;  . GLAUCOMA SURGERY  2007  . LAPAROSCOPIC SMALL BOWEL RESECTION  12/04/2017  . LAPAROSCOPIC SMALL BOWEL RESECTION N/A 12/04/2017   Procedure: LAPAROSCOPIC SMALL BOWEL RESECTION;  Surgeon: Stark Klein, MD;  Location: [place];  Service: General;  Laterality: N/A;  . LAPAROSCOPY N/A 12/04/2017   Procedure: LAPAROSCOPY DIAGNOSTIC;  Surgeon: Stark Klein, MD;  Location: [place];  Service: General;  Laterality: N/A;  . LAPAROSCOPY N/A 12/27/2017   Procedure: DIAGNOSTIC LAPAROSCOPY/ LAPAROTOMY WITH SMALL BOWEL RESECTION TIMES TWO;  Surgeon: Stark Klein, MD;  Location: [place];  Service:  General;  Laterality: N/A;  . MULTIPLE TOOTH EXTRACTIONS    . PICC LINE INSERTION  12/24/2017    Family History: History reviewed. No pertinent family history.  Social History:  reports that  has never smoked. she has never used smokeless tobacco. She reports that she does not drink alcohol or use drugs.  Allergies: No Known Allergies  Medications: Medications reviewed in epic  Please HPI for pertinent positives, otherwise complete 10 system ROS negative,except minimal abdominal tenderness.  Mallampati Score: MD Evaluation Airway: WNL Heart: WNL Abdomen: Other (comments) Abdomen comments: surgical sites intact Chest/ Lungs: WNL ASA  Classification: 3 Mallampati/Airway Score: Three  Physical Exam: BP 126/65 (BP Location: Left Arm)   Pulse 94   Temp 97.9 F (36.6 C) (Oral)   Resp 16   Ht '5\' 5"'  (1.651 m) Comment: 12/04/17  Wt 138 lb 14.2 oz (63 kg)   SpO2 100%   BMI 23.11 kg/m  Body mass index is 23.11 kg/m. General: pleasant, elderly, frail black female who is laying in bed in NAD HEENT: head is normocephalic, atraumatic.  Sclera are noninjected.  PERRL.  Ears and nose without any masses or lesions.  Mouth is pink  Heart: regular, rate, and rhythm.  Normal s1,s2. No obvious murmurs, gallops, or rubs noted.  Palpable radial pulses bilaterally Lungs: CTAB, no wheezes, rhonchi, or rales noted.  Respiratory effort nonlabored Abd: soft, minimally tender, ND, +BS, no masses, hernias, or  organomegaly, sites are intact Psych: Alert and seems oriented, but wants to speak to her husband.  He is not listed on her contacts, only her nieces and nephews which makes me wonder if her husband has passed?  No one there to verify this.  She has nonviolent medical restraints in place on her hands for fidgeting    Labs: Results for orders placed or performed during the hospital encounter of 12/24/17 (from the past 48 hour(s))  Glucose, capillary     Status: Abnormal   Collection Time:  01/06/18 11:48 AM  Result Value Ref Range   Glucose-Capillary 170 (H) 65 - 99 mg/dL  Glucose, capillary     Status: Abnormal   Collection Time: 01/06/18  4:45 PM  Result Value Ref Range   Glucose-Capillary 137 (H) 65 - 99 mg/dL  Glucose, capillary     Status: None   Collection Time: 01/06/18  8:23 PM  Result Value Ref Range   Glucose-Capillary 93 65 - 99 mg/dL  Glucose, capillary     Status: Abnormal   Collection Time: 01/06/18 11:48 PM  Result Value Ref Range   Glucose-Capillary 154 (H) 65 - 99 mg/dL  Comprehensive metabolic panel     Status: Abnormal   Collection Time: 01/07/18  4:14 AM  Result Value Ref Range   Sodium 141 135 - 145 mmol/L   Potassium 3.9 3.5 - 5.1 mmol/L   Chloride 105 101 - 111 mmol/L   CO2 25 22 - 32 mmol/L   Glucose, Bld 140 (H) 65 - 99 mg/dL   BUN 26 (H) 6 - 20 mg/dL   Creatinine, Ser 0.74 0.44 - 1.00 mg/dL   Calcium 8.5 (L) 8.9 - 10.3 mg/dL   Total Protein 5.5 (L) 6.5 - 8.1 g/dL   Albumin 1.5 (L) 3.5 - 5.0 g/dL   AST 37 15 - 41 U/L   ALT 34 14 - 54 U/L   Alkaline Phosphatase 234 (H) 38 - 126 U/L   Total Bilirubin 1.1 0.3 - 1.2 mg/dL   GFR calc non Af Amer >60 >60 mL/min   GFR calc Af Amer >60 >60 mL/min    Comment: (NOTE) The eGFR has been calculated using the CKD EPI equation. This calculation has not been validated in all clinical situations. eGFR's persistently <60 mL/min signify possible Chronic Kidney Disease.    Anion gap 11 5 - 15  Magnesium     Status: None   Collection Time: 01/07/18  4:14 AM  Result Value Ref Range   Magnesium 2.0 1.7 - 2.4 mg/dL  Phosphorus     Status: None   Collection Time: 01/07/18  4:14 AM  Result Value Ref Range   Phosphorus 3.3 2.5 - 4.6 mg/dL  CBC     Status: Abnormal   Collection Time: 01/07/18  4:14 AM  Result Value Ref Range   WBC 12.8 (H) 4.0 - 10.5 K/uL   RBC 3.27 (L) 3.87 - 5.11 MIL/uL   Hemoglobin 8.5 (L) 12.0 - 15.0 g/dL   HCT 27.1 (L) 36.0 - 46.0 %   MCV 82.9 78.0 - 100.0 fL   MCH 26.0 26.0 -  34.0 pg   MCHC 31.4 30.0 - 36.0 g/dL   RDW 17.2 (H) 11.5 - 15.5 %   Platelets 904 (HH) 150 - 400 K/uL    Comment: REPEATED TO VERIFY CRITICAL RESULT CALLED TO, READ BACK BY AND VERIFIED WITH: O.ORONSAYE RN [id number] 01/07/18 HMILES   Differential     Status: Abnormal  Collection Time: 01/07/18  4:14 AM  Result Value Ref Range   Neutrophils Relative % 84 %   Neutro Abs 10.7 (H) 1.7 - 7.7 K/uL   Lymphocytes Relative 5 %   Lymphs Abs 0.7 0.7 - 4.0 K/uL   Monocytes Relative 7 %   Monocytes Absolute 0.9 0.1 - 1.0 K/uL   Eosinophils Relative 3 %   Eosinophils Absolute 0.4 0.0 - 0.7 K/uL   Basophils Relative 1 %   Basophils Absolute 0.1 0.0 - 0.1 K/uL  Triglycerides     Status: None   Collection Time: 01/07/18  4:14 AM  Result Value Ref Range   Triglycerides 133 <150 mg/dL  Prealbumin     Status: Abnormal   Collection Time: 01/07/18  4:14 AM  Result Value Ref Range   Prealbumin 15.0 (L) 18 - 38 mg/dL  Glucose, capillary     Status: Abnormal   Collection Time: 01/07/18  4:32 AM  Result Value Ref Range   Glucose-Capillary 126 (H) 65 - 99 mg/dL   Comment 1 Document in Chart   Glucose, capillary     Status: Abnormal   Collection Time: 01/07/18  7:28 AM  Result Value Ref Range   Glucose-Capillary 162 (H) 65 - 99 mg/dL  Glucose, capillary     Status: Abnormal   Collection Time: 01/07/18 12:10 PM  Result Value Ref Range   Glucose-Capillary 144 (H) 65 - 99 mg/dL  Glucose, capillary     Status: Abnormal   Collection Time: 01/07/18  4:36 PM  Result Value Ref Range   Glucose-Capillary 142 (H) 65 - 99 mg/dL  Glucose, capillary     Status: Abnormal   Collection Time: 01/07/18  8:05 PM  Result Value Ref Range   Glucose-Capillary 121 (H) 65 - 99 mg/dL  Glucose, capillary     Status: Abnormal   Collection Time: 01/08/18  1:13 AM  Result Value Ref Range   Glucose-Capillary 108 (H) 65 - 99 mg/dL  Glucose, capillary     Status: Abnormal   Collection Time: 01/08/18  4:45 AM  Result Value Ref  Range   Glucose-Capillary 137 (H) 65 - 99 mg/dL  CBC     Status: Abnormal   Collection Time: 01/08/18  5:18 AM  Result Value Ref Range   WBC 11.2 (H) 4.0 - 10.5 K/uL   RBC 3.20 (L) 3.87 - 5.11 MIL/uL   Hemoglobin 8.5 (L) 12.0 - 15.0 g/dL   HCT 26.9 (L) 36.0 - 46.0 %   MCV 84.1 78.0 - 100.0 fL   MCH 26.6 26.0 - 34.0 pg   MCHC 31.6 30.0 - 36.0 g/dL   RDW 17.8 (H) 11.5 - 15.5 %   Platelets 955 (HH) 150 - 400 K/uL    Comment: REPEATED TO VERIFY CRITICAL VALUE NOTED.  VALUE IS CONSISTENT WITH PREVIOUSLY REPORTED AND CALLED VALUE.   Glucose, capillary     Status: Abnormal   Collection Time: 01/08/18  7:30 AM  Result Value Ref Range   Glucose-Capillary 143 (H) 65 - 99 mg/dL    Imaging: Ct Abdomen Pelvis W Contrast  Result Date: 01/07/2018 CLINICAL DATA:  84 year old female with diffuse abdominal pain EXAM: CT ABDOMEN AND PELVIS WITH CONTRAST TECHNIQUE: Multidetector CT imaging of the abdomen and pelvis was performed using the standard protocol following bolus administration of intravenous contrast. CONTRAST:  138m ISOVUE-300 IOPAMIDOL (ISOVUE-300) INJECTION 61% COMPARISON:  Recent prior CT scan of the abdomen and pelvis 12/24/2017 FINDINGS: Lower chest: Mild dependent atelectasis. PICC line terminates  in the right atrium. The heart is normal in size. No pericardial effusion. Unremarkable distal thoracic esophagus. Hepatobiliary: Normal hepatic contour and morphology. No discrete hepatic lesions. Normal appearance of the gallbladder. No intra or extrahepatic biliary ductal dilatation. Pancreas: Slight interval increase in dilatation of the main pancreatic duct to 6 mm compared to 4-5 mm previously. Additionally, there is a suggestion of a filling defect in the distal common bile duct concerning for choledocholithiasis. No significant pancreatic inflammation and no evidence of pancreatic mass. Spleen: Normal in size without focal abnormality. Adrenals/Urinary Tract: Normal adrenal glands. No  hydronephrosis, enhancing renal mass or nephrolithiasis. Unremarkable ureters and bladder. Stomach/Bowel: The stomach is within normal limits. Evidence of prior small bowel resection. The anastomoses appear patent. No evidence of obstruction. Persistent peripherally enhancing fluid and gas collection in the right anatomic pelvis which has significantly decreased in size presently measuring approximately 8.7 x 4.4 cm compared to 11.3 by 9 cm. There is a small sinus tract extending toward the inferior end of the midline abdominal wound. A second small focus of peripherally enhancing fluid is identified at the bladder dome measuring 1.8 cm. Small amount of loculated fluid within the right pericolic gutter measures 2.0 x 1.3 cm. Vascular/Lymphatic: Atherosclerotic vascular calcifications. No aneurysm. There is a focal filling defect within the right femoral vein in the visualized proximal thigh extending into the right common femoral vein consistent with acute nonocclusive DVT. Reproductive: Pelvic floor laxity. Pessary in place. Status post hysterectomy. No adnexal masses. Other: Intra-abdominal abscesses as described above. Musculoskeletal: No acute osseous abnormality. Severe multilevel degenerative disc disease with levoconvex scoliosis of the lumbar spine. IMPRESSION: 1. Acute nonocclusive DVT in the right common femoral and femoral vein in the proximal thigh. 2. Peripherally enhancing loculated fluid in the right lower quadrant most consistent with an intraabdominal abscess. A small amount of fluid tracks anteriorly to the open anterior abdominal incision. Additional small foci of loculated fluid present anterior to the bladder and in the right pericolic gutter. 3. Slight interval increase in dilatation of the main pancreatic duct with a suggestion of a filling defect in the distal common bile duct. Choledocholithiasis is not excluded. Recommend clinical correlation with serum LFTs/bilirubin. 4. Additional  ancillary findings as above without significant interval change compared to recent prior imaging. These results were called by telephone at the time of interpretation on 01/07/2018 at 4:50 pm to Dr. Stark Klein , who verbally acknowledged these results. Electronically Signed   By: Jacqulynn Cadet M.D.   On: 01/07/2018 16:52    Assessment/Plan 1. Intra-abdominal fluid collection, s/p dx lap with SBR x 2.  Plan to proceed with a drain placement as soon as we can obtain consent.  Patient does not want to proceed until she speaks to her husband.  No husband is listed in her contact information.  Her nephew, Artelia Laroche, is listed as her POA.  I have called his contact number several times with no answer.  We will continue to try to obtain consent so we can more forward with this procedure.  Labs and vitals reviewed.   Thank you for this interesting consult.  I greatly enjoyed meeting Theresa Howe and look forward to participating in their care.  A copy of this report was sent to the requesting provider on this date.  Electronically Signed: [name] 01/08/2018, 11:25 AM   I spent a total of 40 Minutes    in face to face in clinical consultation, greater than 50% of  which was counseling/coordinating care for intra-abdominal fluid collection

## 2018-01-08 NOTE — Progress Notes (Signed)
D/W Dr. Barry Dienes.  Unable to contact family to get consent for drain placement after multiple attempts today.  She is going to go ahead and given Lovenox 60mg  now and we will plan for drain placement tomorrow pending we can contact the family.  I have asked pharmacy to continue to hold VTE treatment until hopefully tomorrow afternoon so the drain can be placed.  NPO p MN.  [name] 2:10 PM 01/08/2018

## 2018-01-09 ENCOUNTER — Encounter (HOSPITAL_COMMUNITY): Payer: Self-pay | Admitting: Interventional Radiology

## 2018-01-09 ENCOUNTER — Inpatient Hospital Stay (HOSPITAL_COMMUNITY): Payer: Medicare Other

## 2018-01-09 HISTORY — PX: IR IMAGE GUIDED DRAINAGE PERCUT CATH  PERITONEAL RETROPERIT: IMG5467

## 2018-01-09 LAB — GLUCOSE, CAPILLARY
GLUCOSE-CAPILLARY: 136 mg/dL — AB (ref 65–99)
Glucose-Capillary: 163 mg/dL — ABNORMAL HIGH (ref 65–99)
Glucose-Capillary: 134 mg/dL — ABNORMAL HIGH (ref 65–99)
Glucose-Capillary: 187 mg/dL — ABNORMAL HIGH (ref 65–99)
Glucose-Capillary: 118 mg/dL — ABNORMAL HIGH (ref 65–99)

## 2018-01-09 LAB — PATHOLOGIST SMEAR REVIEW

## 2018-01-09 MED ORDER — MIDAZOLAM HCL 2 MG/2ML IJ SOLN
INTRAMUSCULAR | Status: AC
  Filled 2018-01-09: qty 2

## 2018-01-09 MED ORDER — FENTANYL CITRATE (PF) 100 MCG/2ML IJ SOLN
INTRAMUSCULAR | Status: AC | PRN
  Administered 2018-01-09: 25 ug via INTRAVENOUS

## 2018-01-09 MED ORDER — LIDOCAINE HCL (PF) 1 % IJ SOLN
INTRAMUSCULAR | Status: AC | PRN
  Administered 2018-01-09: 5 mL

## 2018-01-09 MED ORDER — IOPAMIDOL (ISOVUE-300) INJECTION 61%
INTRAVENOUS | Status: AC
  Filled 2018-01-09: qty 50

## 2018-01-09 MED ORDER — TRAVASOL 10 % IV SOLN
INTRAVENOUS | Status: AC
  Administered 2018-01-09: 18:00:00 via INTRAVENOUS
  Filled 2018-01-09: qty 904.8

## 2018-01-09 MED ORDER — FENTANYL CITRATE (PF) 100 MCG/2ML IJ SOLN
INTRAMUSCULAR | Status: AC
  Filled 2018-01-09: qty 2

## 2018-01-09 MED ORDER — LIDOCAINE HCL 1 % IJ SOLN
INTRAMUSCULAR | Status: AC
  Filled 2018-01-09: qty 20

## 2018-01-09 MED ORDER — MIDAZOLAM HCL 2 MG/2ML IJ SOLN
INTRAMUSCULAR | Status: AC | PRN
  Administered 2018-01-09: 1 mg via INTRAVENOUS

## 2018-01-09 MED ORDER — ENOXAPARIN SODIUM 60 MG/0.6ML ~~LOC~~ SOLN
60.0000 mg | Freq: Two times a day (BID) | SUBCUTANEOUS | Status: DC
  Administered 2018-01-09 – 2018-01-13 (×7): 60 mg via SUBCUTANEOUS
  Filled 2018-01-09 (×7): qty 0.6

## 2018-01-09 NOTE — Progress Notes (Signed)
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 PMHx: DM, glaucoma, HTN, neuropathy    PT Comments    Patient tolerated short distance gait in room this am. Pt required min A for functional transfers and min/mod A for ambulation with RW. Continue to progress as tolerated.    Follow Up Recommendations  SNF;Supervision/Assistance - 24 hour     Equipment Recommendations  Rolling walker with 5" wheels;3in1 (PT)    Recommendations for Other Services       Precautions / Restrictions Precautions Precautions: Fall Precaution Comments: Abdominial incision, sacral wound Restrictions Weight Bearing Restrictions: No    Mobility  Bed Mobility Overal bed mobility: Needs Assistance Bed Mobility: Supine to Sit     Supine to sit: Min guard     General bed mobility comments: min guard for safety  Transfers Overall transfer level: Needs assistance Equipment used: Rolling walker (2 wheeled);1 person hand held assist Transfers: Sit to/from Stand Sit to Stand: Min assist         General transfer comment: assist for balance upon standing; pt tends to use bilat LE against surface to stand and try to gain balance  Ambulation/Gait Ambulation/Gait assistance: Min assist;Mod assist Ambulation Distance (Feet): (79ft then ~21ft) Assistive device: Rolling walker (2 wheeled) Gait Pattern/deviations: Step-through pattern;Decreased stride length;Trunk flexed;Drifts right/left Gait velocity: Decreased   General Gait Details: cues for proximity to RW and posture; assist for balance and safe use of AD; pt required min A for straight path and mod A when turning or changing directions; needs assistance to navigate environment   Stairs            Wheelchair  Mobility    Modified Rankin (Stroke Patients Only)       Balance Overall balance assessment: Needs assistance Sitting-balance support: Feet supported;No upper extremity supported Sitting balance-Leahy Scale: Good     Standing balance support: Bilateral upper extremity supported;During functional activity Standing balance-Leahy Scale: Poor                              Cognition Arousal/Alertness: Awake/alert Behavior During Therapy: WFL for tasks assessed/performed Overall Cognitive Status: No family/caregiver present to determine baseline cognitive functioning Area of Impairment: Attention;Memory;Following commands;Safety/judgement;Awareness;Problem solving                 Orientation Level: Disoriented to;Place;Time;Situation Current Attention Level: Sustained Memory: Decreased short-term memory Following Commands: Follows one step commands inconsistently;Follows one step commands with increased time Safety/Judgement: Decreased awareness of safety;Decreased awareness of deficits Awareness: Intellectual Problem Solving: Slow processing;Decreased initiation;Difficulty sequencing;Requires verbal cues;Requires tactile cues General Comments: needs almost constant redirection to tasks      Exercises      General Comments        Pertinent Vitals/Pain Pain Assessment: Faces Faces Pain Scale: Hurts a little bit Pain Location: Abdomen Pain Descriptors / Indicators: Aching Pain Intervention(s): Monitored during session    Home Living                      Prior Function            PT Goals (current goals can now be found in the care plan section) Acute Rehab PT Goals PT Goal Formulation: With  patient Time For Goal Achievement: 01/13/18 Potential to Achieve Goals: Good Progress towards PT goals: Progressing toward goals    Frequency    Min 2X/week      PT Plan Current plan remains appropriate    Co-evaluation               AM-PAC PT "6 Clicks" Daily Activity  Outcome Measure  Difficulty turning over in bed (including adjusting bedclothes, sheets and blankets)?: A Little Difficulty moving from lying on back to sitting on the side of the bed? : A Little Difficulty sitting down on and standing up from a chair with arms (e.g., wheelchair, bedside commode, etc,.)?: Unable Help needed moving to and from a bed to chair (including a wheelchair)?: A Little Help needed walking in hospital room?: A Lot Help needed climbing 3-5 steps with a railing? : A Lot 6 Click Score: 14    End of Session Equipment Utilized During Treatment: Gait belt Activity Tolerance: Patient tolerated treatment well Patient left: with call bell/phone within reach;in chair;with chair alarm set;with restraints reapplied(bilat mittens) Nurse Communication: Mobility status PT Visit Diagnosis: Other abnormalities of gait and mobility (R26.89);Muscle weakness (generalized) (M62.81) Pain - part of body: (abdomen)     Time: 1610-9604 PT Time Calculation (min) (ACUTE ONLY): 23 min  Charges:  $Gait Training: 8-22 mins $Therapeutic Activity: 8-22 mins                    G Codes:       Erline Levine, PTA Pager: [phone]     Carolynne Edouard 01/09/2018, 10:38 AM

## 2018-01-09 NOTE — Care Management Important Message (Signed)
Important Message  Patient Details  Name: Theresa Howe MRN: [id number] Date of Birth: 21-Apr-1931   Medicare Important Message Given:  Yes    Orbie Pyo 01/09/2018, 12:46 PM

## 2018-01-09 NOTE — Progress Notes (Signed)
Consent obtained from Artelia Laroche. Plan to proceed with drain placement as schedule as allows.   Brynda Greathouse, MS RD PA-C 8:27 AM

## 2018-01-09 NOTE — Progress Notes (Signed)
13 Days Post-Op   Subjective/Chief Complaint: Pt having some pain.  Is reportedly being a bit more confused.  Requiring mittens at night in order not to pull at things.     Objective: Vital signs in last 24 hours: Temp:  [97.9 F (36.6 C)-99.3 F (37.4 C)] 97.9 F (36.6 C) (01/23 1454) Pulse Rate:  [87-100] 100 (01/23 1454) Resp:  [15-16] 16 (01/23 1454) BP: (110-147)/(60-77) 133/77 (01/23 1454) SpO2:  [98 %-100 %] 99 % (01/23 1454) Last BM Date: 01/07/18  Intake/Output from previous day: 01/22 0701 - 01/23 0700 In: 919 [I.V.:819; IV Piggyback:100] Out: -  Intake/Output this shift: Total I/O In: 0  Out: 95 [Drains:95]  General appearance: sleepy, but awakens easily.  Was able to answer questions appropriately.   GI: soft, nt, nd, incision - c/d/i. Slightly full RLQ.  Lab Results:  Recent Labs    01/07/18 0414 01/08/18 0518  WBC 12.8* 11.2*  HGB 8.5* 8.5*  HCT 27.1* 26.9*  PLT 904* 955*   BMET Recent Labs    01/07/18 0414  NA 141  K 3.9  CL 105  CO2 25  GLUCOSE 140*  BUN 26*  CREATININE 0.74  CALCIUM 8.5*   Studies/Results: Ir US Guide Bx Asp/drain  Result Date: 01/09/2018 INDICATION: 84 year old female with complex peripherally enhancing fluid collection in the right lower quadrant following complex abdominal surgery. Findings concerning for intra-abdominal abscess. She presents for image guided drain placement. EXAM: Ultrasound-guided drain placement MEDICATIONS: The patient is currently admitted to the hospital and receiving intravenous antibiotics. The antibiotics were administered within an appropriate time frame prior to the initiation of the procedure. ANESTHESIA/SEDATION: Fentanyl 25 mcg IV; Versed 1 mg IV Moderate Sedation Time:  12 minutes The patient was continuously monitored during the procedure by the interventional radiology nurse under my direct supervision. COMPLICATIONS: None immediate. PROCEDURE: Informed written consent was obtained from the  patient after a thorough discussion of the procedural risks, benefits and alternatives. All questions were addressed. Maximal Sterile Barrier Technique was utilized including caps, mask, sterile gowns, sterile gloves, sterile drape, hand hygiene and skin antiseptic. A timeout was performed prior to the initiation of the procedure. The right lower quadrant was interrogated with ultrasound. A complex fluid and gas collection is successfully and easily identified. A suitable skin entry site was selected and marked. Local anesthesia was attained by infiltration with 1% lidocaine. A small dermatotomy was made. Under real-time sonographic guidance, an 18 gauge trocar needle was carefully advanced into the fluid collection. A 0.035 Amplatz wire was then coiled in the fluid collection. The needle was removed. The skin tract was dilated to 79 [place] and a Cook 12 [place] all-purpose drainage catheter was advanced over the wire into the complex fluid collection. Aspiration yields bloody purulent fluid. This appears to be an infected hematoma. Aspiration and lavage were performed until approximately 90 mL of bloody purulent fluid was evacuated. The drain was then flushed a final time and connected to JP bulb suction before being secured to the skin with 0 Prolene suture. The patient tolerated the procedure well. IMPRESSION: Successful placement of a 12 French drainage catheter into the right lower quadrant fluid collection. The aspirate is bloody, purulent fluid most suggestive of an infected hematoma. A sample of the aspirate was sent for culture. Signed, Criselda Peaches, MD [facility] Radiology Electronically Signed   By: Jacqulynn Cadet M.D.   On: 01/09/2018 12:28    Anti-infectives: Anti-infectives (From admission, onward)  Start     Dose/Rate Route Frequency Ordered Stop   12/31/17 1300  piperacillin-tazobactam (ZOSYN) IVPB 3.375 g     3.375 g 12.5 mL/hr over  240 Minutes Intravenous Every 8 hours 12/31/17 1252        Assessment/Plan: s/p Procedure(s): DIAGNOSTIC LAPAROSCOPY/ LAPAROTOMY WITH SMALL BOWEL RESECTION TIMES TWO (N/A)  Drain today for fluid collection DVT- lovenox to start post drain. Continue TPN  Mobilize with tx as tolerated.   WBC down-trending Severe protein calorie malnutrition.     LOS: 16 days   Stark Klein 01/09/2018

## 2018-01-09 NOTE — Progress Notes (Signed)
ANTICOAGULATION CONSULT NOTE - Initial Consult  Pharmacy Consult:  Lovenox Indication:  Acute DVT  No Known Allergies  Patient Measurements: Height: 5\' 5"  (165.1 cm)(12/04/17) Weight: 138 lb 14.2 oz (63 kg) IBW/kg (Calculated) : 57  Vital Signs: Temp: 97.9 F (36.6 C) (01/23 0427) Temp Source: Oral (01/23 0427) BP: 111/65 (01/23 1200) Pulse Rate: 90 (01/23 1200)  Labs: Recent Labs    01/07/18 0414 01/08/18 0518 01/08/18 1452  HGB 8.5* 8.5*  --   HCT 27.1* 26.9*  --   PLT 904* 955*  --   LABPROT  --   --  15.0  INR  --   --  1.19  CREATININE 0.74  --   --     Estimated Creatinine Clearance: 47.1 mL/min (by C-G formula based on SCr of 0.74 mg/dL).   Medical History: Past Medical History:  Diagnosis Date  . Abdominal mass 10/2017   SMALL BOWEL  . Diabetes mellitus    type 2  . Dyspnea   . Failure to thrive (0-17) 12/24/2017  . Glaucoma   . Hyperlipidemia   . Hypertension   . Neuropathy   . Osteoporosis   . Wheezing       Assessment: 55 YOF with acute DVT to start full-dose Lovenox.  Patient is s/p aspiration and drain placement today 01/09/18.  Patient's renal function is stable.  LFTs WNL, hemoglobin drifting down slowly and platelet count is elevated.  No bleeding reported.   Goal of Therapy:  Anti-Xa level 0.6-1 units/ml 4hrs after LMWH dose given Monitor platelets by anticoagulation protocol: Yes    Plan:  Lovenox 60mg  SQ Q12H, start >6 hours after procedure Pharmacy will sign off and follow peripherally.  Thank you for the consult! CBC weekly along with TPN   Sinda Leedom D. Mina Marble, PharmD, BCPS Pager:  [phone] 01/09/2018, 12:40 PM

## 2018-01-09 NOTE — Progress Notes (Signed)
PHARMACY - ADULT TOTAL PARENTERAL NUTRITION CONSULT NOTE   Pharmacy Consult: TPN Indication: Intolerance to enteral feeding  Patient Measurements: Height: 5' 5" (165.1 cm)(12/04/17) Weight: 138 lb 14.2 oz (63 kg) IBW/kg (Calculated) : 57 TPN AdjBW (KG): 57.6 Body mass index is 23.11 kg/m.  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  Patient was [facility]-admitted from SNF after she began having nausea and vomiting and unable to tolerate enteral feeding.  Found to have a large dilated segment of bowel with bezoar and necrosis, now s/p SBR x 2.  Pharmacy managing TPN for nutritional support.  GI: prealbumin 5.3 >> 15, LBM 1/20 - IV PPI, PRN Zofran/Phenergan.  Possible choledocholithiasis on CT Endo: DM on metformin PTA. CBGs has been variable, well controlled today.  Relying mostly on SSI to cover for hyperglycemia instead of increasing insulin in TPN and expose pt to risk of hypoglycemia.  Insulin requirements in the past 24 hours: 8 units SSI + 30 units in TPN Lytes: all WNL on 1/21 Renal: 1/21 labs - SCr 0.74 stable, BUN up 26 - UOP 0.7 ml/kg/hr, net +15.5L since admit, LR at [place]: stable on RA Cards: VSS - ASA AC: acute nonocclusive DVT on 1/21 CT Hepatobil: alk phos elevated, AST/ALT/tbili WNL.  TG WNL. Neuro: confused - Lexapro, Remeron, s/p Haldol for sundowning (QTc 458m) ID: Zosyn (1/14 >> ) for IAI. 1/21 CT showed abscess - afebrile, WBC down to 11.2 TPN Access: PICC placed 12/24/17>> pulled out 1/17; replaced 01/04/18 TPN start date: 12/25/17>>1/17 (lost PICC); 01/04/18  Nutritional Goals (per RD rec on 1/21): 1500-1700 kCal and 75-90 gram protein per day  Current Nutrition: TPN    Plan:  Continue TPN at 65 ml/hr, providing 90g AA, 234g CHO, and 37g ILE, which equals to 1532 kCals per day, meeting 100% of patient needs. Electrolytes in TPN: Cl:Ac 1:2, Na increased 1/15, Mag providing 22 mEq/d (max given calcium  provision) Daily multivitamin and trace elements in TPN Continue moderate Q4H + 30 units regular insulin in TPN F/U AM labs F/U post IR to start full-dose Lovenox for new DVT   Louanne Calvillo D. DMina Marble PharmD, BCPS Pager:  [phone]/23/2019, 8:02 AM

## 2018-01-09 NOTE — Procedures (Signed)
Interventional Radiology Procedure Note  Procedure: Placement of a 73F drain into the RLQ fluid and gas collection.  Aspiration yields approximately 90 mL bloody purulent fluid.   Complications: NOne  Estimated Blood Loss: None  Recommendations: - Drain to JP bulb suction - Aspirate sent for culture  Signed,  Criselda Peaches, MD

## 2018-01-10 ENCOUNTER — Encounter (HOSPITAL_COMMUNITY): Payer: Self-pay

## 2018-01-10 ENCOUNTER — Other Ambulatory Visit: Payer: Self-pay | Admitting: *Deleted

## 2018-01-10 LAB — GLUCOSE, CAPILLARY
GLUCOSE-CAPILLARY: 144 mg/dL — AB (ref 65–99)
GLUCOSE-CAPILLARY: 159 mg/dL — AB (ref 65–99)
GLUCOSE-CAPILLARY: 193 mg/dL — AB (ref 65–99)
Glucose-Capillary: 116 mg/dL — ABNORMAL HIGH (ref 65–99)
Glucose-Capillary: 124 mg/dL — ABNORMAL HIGH (ref 65–99)
Glucose-Capillary: 138 mg/dL — ABNORMAL HIGH (ref 65–99)
Glucose-Capillary: 149 mg/dL — ABNORMAL HIGH (ref 65–99)

## 2018-01-10 LAB — COMPREHENSIVE METABOLIC PANEL
ALBUMIN: 1.7 g/dL — AB (ref 3.5–5.0)
ALK PHOS: 182 U/L — AB (ref 38–126)
ALT: 32 U/L (ref 14–54)
ANION GAP: 11 (ref 5–15)
AST: 27 U/L (ref 15–41)
BILIRUBIN TOTAL: 0.6 mg/dL (ref 0.3–1.2)
BUN: 27 mg/dL — ABNORMAL HIGH (ref 6–20)
CALCIUM: 8.8 mg/dL — AB (ref 8.9–10.3)
CO2: 25 mmol/L (ref 22–32)
Chloride: 106 mmol/L (ref 101–111)
Creatinine, Ser: 0.72 mg/dL (ref 0.44–1.00)
Glucose, Bld: 139 mg/dL — ABNORMAL HIGH (ref 65–99)
POTASSIUM: 3.8 mmol/L (ref 3.5–5.1)
Sodium: 142 mmol/L (ref 135–145)
TOTAL PROTEIN: 5.7 g/dL — AB (ref 6.5–8.1)

## 2018-01-10 LAB — MAGNESIUM: MAGNESIUM: 1.8 mg/dL (ref 1.7–2.4)

## 2018-01-10 LAB — PHOSPHORUS: Phosphorus: 3.4 mg/dL (ref 2.5–4.6)

## 2018-01-10 MED ORDER — SODIUM CHLORIDE 0.9% FLUSH
5.0000 mL | Freq: Three times a day (TID) | INTRAVENOUS | Status: DC
  Administered 2018-01-10 – 2018-01-23 (×20): 5 mL via INTRAVENOUS

## 2018-01-10 MED ORDER — DEXTROSE 5 % IV SOLN
3.0000 g | Freq: Once | INTRAVENOUS | Status: AC
  Administered 2018-01-10: 3 g via INTRAVENOUS
  Filled 2018-01-10: qty 6

## 2018-01-10 MED ORDER — SODIUM ACETATE 2 MEQ/ML IV SOLN
INTRAVENOUS | Status: AC
Start: 2018-01-10 — End: 2018-01-11
  Administered 2018-01-10: 18:00:00 via INTRAVENOUS
  Filled 2018-01-10: qty 904.8

## 2018-01-10 NOTE — Progress Notes (Signed)
Chief Complaint: Patient was seen today for follow up RLQ abscess drain   Supervising Physician: Theresa Howe  Patient Status: [facility] - In-pt  Subjective: S/p perc drain to RLQ abscess Pt without c/o Sitting in chair to eat, but reports no appetite  Objective: Physical Exam: BP 134/62   Pulse 97   Temp 98.3 F (36.8 C)   Resp 15   Ht 5\' 5"  (1.651 m) Comment: 12/04/17  Wt 138 lb 14.2 oz (63 kg)   SpO2 97%   BMI 23.11 kg/m  RLQ drain intact, site clean, mild site tenderness. Output hazy serosanguinous   Current Facility-Administered Medications:  .  0.9 %  sodium chloride infusion, , Intravenous, Once, Stark Klein, MD .  0.9 %  sodium chloride infusion, 10 mL/hr, Intravenous, Once, Turner, Atlee Abide, CRNA .  acetaminophen (TYLENOL) tablet 650 mg, 650 mg, Oral, Q6H PRN, 650 mg at 01/05/18 1253 **OR** acetaminophen (TYLENOL) suppository 650 mg, 650 mg, Rectal, Q6H PRN, Stark Klein, MD, 650 mg at 12/31/17 1600 .  albuterol (PROVENTIL) (2.5 MG/3ML) 0.083% nebulizer solution 3 mL, 3 mL, Inhalation, Q6H PRN, Stark Klein, MD .  aspirin chewable tablet 81 mg, 81 mg, Oral, Daily, Stark Klein, MD, 81 mg at 01/10/18 1111 .  chlorhexidine (PERIDEX) 0.12 % solution 15 mL, 15 mL, Mouth Rinse, BID, Stark Klein, MD, 15 mL at 01/10/18 1111 .  collagenase (SANTYL) ointment 1 application, 1 application, Topical, Daily, Stark Klein, MD, 1 application at 81/19/14 1111 .  diphenhydrAMINE (BENADRYL) 12.5 MG/5ML elixir 12.5 mg, 12.5 mg, Oral, Q6H PRN, 12.5 mg at 12/29/17 2224 **OR** diphenhydrAMINE (BENADRYL) injection 12.5 mg, 12.5 mg, Intravenous, Q6H PRN, Stark Klein, MD, 12.5 mg at 12/30/17 2150 .  dorzolamide (TRUSOPT) 2 % ophthalmic solution 1 drop, 1 drop, Both Eyes, BID, Stark Klein, MD, 1 drop at 01/10/18 1112 .  enoxaparin (LOVENOX) injection 60 mg, 60 mg, Subcutaneous, Q12H, Dang, Thuy D, RPH, 60 mg at 01/10/18 7829 .  escitalopram (LEXAPRO) tablet 5 mg, 5 mg,  Oral, QHS, Stark Klein, MD, 5 mg at 01/09/18 2109 .  fentaNYL (SUBLIMAZE) injection, , Intravenous, PRN, Jacqulynn Cadet, MD, 25 mcg at 01/09/18 1139 .  fluticasone (FLONASE) 50 MCG/ACT nasal spray 2 spray, 2 spray, Each Nare, Daily, Stark Klein, MD, 2 spray at 01/10/18 1112 .  hydrALAZINE (APRESOLINE) injection 10 mg, 10 mg, Intravenous, Q2H PRN, Stark Klein, MD .  insulin aspart (novoLOG) injection 0-15 Units, 0-15 Units, Subcutaneous, Q4H, Tyrone Apple, RPH, 2 Units at 01/10/18 1231 .  lactated ringers infusion, , Intravenous, Continuous, Stark Klein, MD, Last Rate: 10 mL/hr at 01/07/18 0454 .  latanoprost (XALATAN) 0.005 % ophthalmic solution 1 drop, 1 drop, Both Eyes, QHS, Stark Klein, MD, 1 drop at 01/09/18 2109 .  lidocaine (PF) (XYLOCAINE) 1 % injection, , , PRN, Jacqulynn Cadet, MD, 5 mL at 01/09/18 1153 .  magnesium sulfate 3 g in dextrose 5 % 100 mL IVPB, 3 g, Intravenous, Once, Tyrone Apple, RPH, Last Rate: 53 mL/hr at 01/10/18 1232, 3 g at 01/10/18 1232 .  MEDLINE mouth rinse, 15 mL, Mouth Rinse, q12n4p, Stark Klein, MD, 15 mL at 01/10/18 1112 .  midazolam (VERSED) injection, , Intravenous, PRN, Jacqulynn Cadet, MD, 1 mg at 01/09/18 1139 .  mirtazapine (REMERON) tablet 7.5 mg, 7.5 mg, Oral, QHS, Stark Klein, MD, 7.5 mg at 01/09/18 2109 .  morphine 4 MG/ML injection 1-2 mg, 1-2 mg, Intravenous, Q2H PRN, Stark Klein, MD, 2 mg at 01/07/18 1238 .  ondansetron (ZOFRAN-ODT) disintegrating tablet 4 mg, 4 mg, Oral, Q6H PRN **OR** ondansetron (ZOFRAN) injection 4 mg, 4 mg, Intravenous, Q6H PRN, Stark Klein, MD, 4 mg at 01/06/18 0445 .  pantoprazole (PROTONIX) injection 40 mg, 40 mg, Intravenous, QHS, Stark Klein, MD, 40 mg at 01/09/18 2108 .  piperacillin-tazobactam (ZOSYN) IVPB 3.375 g, 3.375 g, Intravenous, Q8H, Millen, Jessica B, RPH, Last Rate: 12.5 mL/hr at 01/10/18 1233, 3.375 g at 01/10/18 1233 .  prochlorperazine (COMPAZINE) tablet 10 mg, 10 mg, Oral, Q6H PRN  **OR** prochlorperazine (COMPAZINE) injection 5-10 mg, 5-10 mg, Intravenous, Q6H PRN, Stark Klein, MD, 5 mg at 12/30/17 1521 .  sodium chloride flush (NS) 0.9 % injection 10-40 mL, 10-40 mL, Intracatheter, PRN, Stark Klein, MD, 10 mL at 01/08/18 0530 .  sodium chloride flush (NS) 0.9 % injection 10-40 mL, 10-40 mL, Intracatheter, PRN, Stark Klein, MD .  sodium chloride flush (NS) 0.9 % injection 5 mL, 5 mL, Intravenous, Q8H, Jacqulynn Cadet, MD, 5 mL at 01/10/18 1234 .  TPN ADULT (ION), , Intravenous, Continuous TPN, Tyrone Apple, RPH, Last Rate: 65 mL/hr at 01/10/18 0500 .  TPN ADULT (ION), , Intravenous, Continuous TPN, Tyrone Apple, [facility]  Labs: CBC Recent Labs    01/08/18 0518  WBC 11.2*  HGB 8.5*  HCT 26.9*  PLT 955*   BMET Recent Labs    01/10/18 0401  NA 142  K 3.8  CL 106  CO2 25  GLUCOSE 139*  BUN 27*  CREATININE 0.72  CALCIUM 8.8*   LFT Recent Labs    01/10/18 0401  PROT 5.7*  ALBUMIN 1.7*  AST 27  ALT 32  ALKPHOS 182*  BILITOT 0.6   PT/INR Recent Labs    01/08/18 1452  LABPROT 15.0  INR 1.19     Studies/Results: Ir Image Guided Drainage Percut Cath  Peritoneal Retroperit  Result Date: 01/09/2018 INDICATION: 84 year old female with complex peripherally enhancing fluid collection in the right lower quadrant following complex abdominal surgery. Findings concerning for intra-abdominal abscess. She presents for image guided drain placement. EXAM: Ultrasound-guided drain placement MEDICATIONS: The patient is currently admitted to the hospital and receiving intravenous antibiotics. The antibiotics were administered within an appropriate time frame prior to the initiation of the procedure. ANESTHESIA/SEDATION: Fentanyl 25 mcg IV; Versed 1 mg IV Moderate Sedation Time:  12 minutes The patient was continuously monitored during the procedure by the interventional radiology nurse under my direct supervision. COMPLICATIONS: None immediate. PROCEDURE: Informed  written consent was obtained from the patient after a thorough discussion of the procedural risks, benefits and alternatives. All questions were addressed. Maximal Sterile Barrier Technique was utilized including caps, mask, sterile gowns, sterile gloves, sterile drape, hand hygiene and skin antiseptic. A timeout was performed prior to the initiation of the procedure. The right lower quadrant was interrogated with ultrasound. A complex fluid and gas collection is successfully and easily identified. A suitable skin entry site was selected and marked. Local anesthesia was attained by infiltration with 1% lidocaine. A small dermatotomy was made. Under real-time sonographic guidance, an 18 gauge trocar needle was carefully advanced into the fluid collection. A 0.035 Amplatz wire was then coiled in the fluid collection. The needle was removed. The skin tract was dilated to 91 [place] and a Cook 12 [place] all-purpose drainage catheter was advanced over the wire into the complex fluid collection. Aspiration yields bloody purulent fluid. This appears to be an infected hematoma. Aspiration and lavage were performed until approximately 90 mL of bloody purulent  fluid was evacuated. The drain was then flushed a final time and connected to JP bulb suction before being secured to the skin with 0 Prolene suture. The patient tolerated the procedure well. IMPRESSION: Successful placement of a 12 French drainage catheter into the right lower quadrant fluid collection. The aspirate is bloody, purulent fluid most suggestive of an infected hematoma. A sample of the aspirate was sent for culture. Signed, Criselda Peaches, MD [facility] Radiology Electronically Signed   By: Jacqulynn Cadet M.D.   On: 01/09/2018 12:28    Assessment/Plan: RLQ abscess S/p perc drain 1/23 IR following.     LOS: 17 days   I spent a total of 15 minutes in face to face in clinical consultation,  greater than 50% of which was counseling/coordinating care for RLQ perc abscess drain  Theresa Vandenbos PA-C 01/10/2018 1:09 PM

## 2018-01-10 NOTE — Progress Notes (Signed)
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 PMHx: DM, glaucoma, HTN, neuropathy   OT comments  Pt making progress with functional goals. Pt continues to require constant cues and redirection to initiate and complete tasks with increased time. Pt very pleasant and cooperative. OT will continue to follow acutely  Follow Up Recommendations  SNF    Equipment Recommendations  None recommended by OT    Recommendations for Other Services      Precautions / Restrictions Precautions Precautions: Fall Precaution Comments: Abdominial incision, sacral wound Restrictions Weight Bearing Restrictions: No       Mobility Bed Mobility Overal bed mobility: Needs Assistance Bed Mobility: Supine to Sit       Sit to supine: Min assist      Transfers Overall transfer level: Needs assistance Equipment used: Rolling walker (2 wheeled);1 person hand held assist Transfers: Sit to/from Stand Sit to Stand: Min assist Stand pivot transfers: Min assist            Balance Overall balance assessment: Needs assistance Sitting-balance support: Feet supported;No upper extremity supported Sitting balance-Leahy Scale: Good     Standing balance support: Bilateral upper extremity supported;During functional activity;Single extremity supported Standing balance-Leahy Scale: Poor                             ADL either performed or assessed with clinical judgement   ADL Overall ADL's : Needs assistance/impaired     Grooming: Wash/dry hands;Wash/dry face;Oral care;Brushing hair;Standing;Min guard   Upper Body Bathing: Sitting;Min guard   Lower Body Bathing: Moderate assistance;Sit to/from stand   Upper Body Dressing : Minimal assistance;Sitting       Toilet Transfer: Ambulation;Comfort height toilet;Grab bars;RW;Minimal assistance     Toileting - Clothing Manipulation Details (indicate cue type and reason): Pt incontinent of urine.  Assisted with peri care      Functional mobility during ADLs: Minimal assistance;Min guard General ADL Comments: required constant multimodal cues and redirection     Vision Patient Visual Report: No change from baseline     Perception     Praxis      Cognition Arousal/Alertness: Awake/alert Behavior During Therapy: WFL for tasks assessed/performed Overall Cognitive Status: No family/caregiver present to determine baseline cognitive functioning Area of Impairment: Attention;Memory;Following commands;Safety/judgement;Awareness;Problem solving                 Orientation Level: Disoriented to;Place;Time;Situation   Memory: Decreased short-term memory   Safety/Judgement: Decreased awareness of safety;Decreased awareness of deficits Awareness: Intellectual Problem Solving: Slow processing;Decreased initiation;Difficulty sequencing;Requires verbal cues;Requires tactile cues General Comments: needs almost constant redirection to tasks, pulling at hand mittens        Exercises     Shoulder Instructions       General Comments      Pertinent Vitals/ Pain       Pain Assessment: No/denies pain Faces Pain Scale: No hurt Pain Intervention(s): Monitored during session  Home Living                                          Prior Functioning/Environment  Frequency  Min 2X/week        Progress Toward Goals  OT Goals(current goals can now be found in the care plan section)  Progress towards OT goals: Progressing toward goals     Plan Discharge plan remains appropriate    Co-evaluation                 AM-PAC PT "6 Clicks" Daily Activity     Outcome Measure   Help from another person eating meals?: Total Help from another  person taking care of personal grooming?: A Little Help from another person toileting, which includes using toliet, bedpan, or urinal?: A Lot Help from another person bathing (including washing, rinsing, drying)?: A Lot Help from another person to put on and taking off regular upper body clothing?: A Little Help from another person to put on and taking off regular lower body clothing?: A Lot 6 Click Score: 13    End of Session    OT Visit Diagnosis: Unsteadiness on feet (R26.81);Cognitive communication deficit (R41.841)   Activity Tolerance Patient tolerated treatment well   Patient Left with call bell/phone within reach;in chair;with chair alarm set   Nurse Communication      Functional Assessment Tool Used: AM-PAC 6 Clicks Daily Activity   Time: 1610-9604 OT Time Calculation (min): 26 min  Charges: OT G-codes **NOT FOR INPATIENT CLASS** Functional Assessment Tool Used: AM-PAC 6 Clicks Daily Activity OT General Charges $OT Visit: 1 Visit OT Treatments $Self Care/Home Management : 8-22 mins $Therapeutic Activity: 8-22 mins     Galen Manila 01/10/2018, 10:14 AM

## 2018-01-10 NOTE — Progress Notes (Signed)
PHARMACY - ADULT TOTAL PARENTERAL NUTRITION CONSULT NOTE   Pharmacy Consult: TPN Indication: Intolerance to enteral feeding  Patient Measurements: Height: '5\' 5"'  (165.1 cm)(12/04/17) Weight: 138 lb 14.2 oz (63 kg) IBW/kg (Calculated) : 57 TPN AdjBW (KG): 57.6 Body mass index is 23.11 kg/m.  Assessment:  64  YOM with stage IV neuroendocrine small bowel cancer s/p ex-lap with SBR in December.  Patient was re-admitted from SNF after she began having nausea and vomiting and unable to tolerate enteral feeding.  Found to have a large dilated segment of bowel with bezoar and necrosis, now s/p SBR x 2.  Pharmacy managing TPN for nutritional support.  GI: prealbumin 5.3 >> 15, LBM 1/20, RLQ drain O/P 117m - IV PPI, PRN Zofran/Phenergan.  Possible choledocholithiasis on CT Endo: DM on metformin PTA. CBGs acceptable.  Relying mostly on SSI to cover for hyperglycemia instead of increasing insulin in TPN and expose pt to risk of hypoglycemia.  Insulin requirements in the past 24 hours: 12 units SSI + 30 units in TPN Lytes: all WNL (K and Mag trending down) Renal: SCr 0.72 stable, BUN up 27 - UOP 0.3 ml/kg/hr, net +13.6L since admit, [place] [facility] [place] on RA Cards: VSS - ASA AC: Lovenox for acute nonocclusive DVT on 1/21 CT - hgb low but stable, plts elevated 900s Hepatobil: alk phos elevated, AST/ALT/tbili WNL.  TG WNL. Neuro: confused - Lexapro, Remeron, s/p Haldol for sundowning (QTc 4481m ID: Zosyn (1/14 >> ) for IAI. 1/21 CT showed abscess - afebrile, WBC down to 11.2 TPN Access: PICC placed 12/24/17>> pulled out 1/17; replaced 01/04/18 TPN start date: 12/25/17>>1/17 (lost PICC); 01/04/18  Nutritional Goals (per RD rec on 1/21): 1500-1700 kCal and 75-90 gram protein per day  Current Nutrition: TPN  Clear liquid diet started 1/23   Plan:  Continue TPN at 65 ml/hr, providing 90g AA, 234g CHO, and 37g ILE, which equals to 1532 kCals per day, meeting 100% of patient needs. Electrolytes in  TPN: Cl:Ac 1:2, increase K 1/24, Mag providing 22 mEq/d (max given calcium provision) Daily multivitamin and trace elements in TPN Continue moderate Q4H + 30 units regular insulin in TPN Mag sulfate 3 gm IV x1 F/U PO intake/diet advancement to start weaning TPN  Continue Lovenox 6056mQ Q12H Weekly CBC   Carmeron Heady D. DanMina MarbleharmD, BCPS Pager:  [phone]/2019, 10:35 AM

## 2018-01-10 NOTE — Progress Notes (Signed)
Nutrition Follow-up  DOCUMENTATION CODES:   Severe malnutrition in context of chronic illness  INTERVENTION:   -RD will follow for diet advancement -Continue TPN management per pharmacy  NUTRITION DIAGNOSIS:   Severe Malnutrition related to chronic illness(stage IV neuroendocrine tumor) as evidenced by energy intake < 75% for > or equal to 1 month, severe fat depletion, severe muscle depletion.  Ongoing  GOAL:   Patient will meet greater than or equal to 90% of their needs  Met with TPN  MONITOR:   Diet advancement, Labs, Weight trends, I & O's, Skin, Other (Comment)(TPN)  REASON FOR ASSESSMENT:   Malnutrition Screening Tool, Consult New TPN/TNA  ASSESSMENT:   Patient is a 84-year-old female who is status post laparoscopic assisted small bowel resection in mid December.  She was discharged to a skilled nursing facility.  She was doing better, but now is throwing up almost everything that she eats.  She is able to keep down a small amount of Ensure.  She also continues to have some postop pain.  She denies fevers and chills.  She is having difficulty working with physical therapy due to weakness.  She is also having difficulty taking her medication because of nausea and vomiting.  1/7- PICC placed 1/10- s/p Diagnostic laparoscopy, exploratory laparotomy, small bowel resection x 2. 1/11- NGT d/c 1/12- NGT reinserted, d/c 1/23- s/p dran placement for RLQ fluid and gas collection (90 ml yielded) due to intra-abdominal abscess  135 ml output from RLQ drain x 24 hours  Spoke with pt, who was very uncomfortable at time of visit- in fetal position in recliner. Pt reports "it hurts when I try to eat"; clear liquid diet tray was untouched. Meal completion 0-10%.   Encouraged pt to consume as much liquids as she can, but assured her that she was also receiving 100% of her nutrition via TPN.   Per pharmacy note, plan to continue TPN at 65 ml/hr, providing 90g AA, 234g CHO, and  37g ILE, which equals to 1532 kCals per day, meeting 100% of patient needs.  Labs reviewed: CBGS: 124-159 (inpatient orders for glycemic control are 0-15 units insulin aspart every 4 hours).   Diet Order:  TPN ADULT (ION) Diet clear liquid Room service appropriate? Yes; Fluid consistency: Thin TPN ADULT (ION)  EDUCATION NEEDS:   Education needs have been addressed  Skin:  Skin Assessment: Skin Integrity Issues: Skin Integrity Issues:: Stage II, Incisions, Stage III Stage II: buttocks Stage III: coccyx Incisions: abdomen  Last BM:  01/10/18  Height:   Ht Readings from Last 1 Encounters:  12/24/17 5' 5" (1.651 m)    Weight:   Wt Readings from Last 1 Encounters:  12/31/17 138 lb 14.2 oz (63 kg)    Ideal Body Weight:  56.8 kg  BMI:  Body mass index is 23.11 kg/m.  Estimated Nutritional Needs:   Kcal:  1500-1700  Protein:  75-90 grams  Fluid:  > 1.5 L     A. , RD, LDN, CDE Pager: [phone] After hours Pager: [phone] 

## 2018-01-10 NOTE — Progress Notes (Signed)
14 Days Post-Op   Subjective/Chief Complaint: Got drain placed.  Culture pending.    Objective: Vital signs in last 24 hours: Temp:  [97.7 F (36.5 C)-98.3 F (36.8 C)] 98.3 F (36.8 C) (01/24 1347) Pulse Rate:  [96-98] 96 (01/24 1347) Resp:  [15-16] 16 (01/24 1347) BP: (134-141)/(62-72) 134/67 (01/24 1347) SpO2:  [97 %-98 %] 98 % (01/24 1347) Last BM Date: 01/10/18  Intake/Output from previous day: 01/23 0701 - 01/24 0700 In: 1522 [P.O.:50; I.V.:1367; IV Piggyback:100] Out: 635 [Urine:500; Drains:135] Intake/Output this shift: Total I/O In: 60 [P.O.:60] Out: -   General appearance: sleepy, but awakens easily.  Was able to answer questions appropriately.   GI: soft, nt, nd, incision - c/d/i.   Lab Results:  Recent Labs    01/08/18 0518  WBC 11.2*  HGB 8.5*  HCT 26.9*  PLT 955*   BMET Recent Labs    01/10/18 0401  NA 142  K 3.8  CL 106  CO2 25  GLUCOSE 139*  BUN 27*  CREATININE 0.72  CALCIUM 8.8*   Studies/Results: Ir Image Guided Drainage Percut Cath  Peritoneal Retroperit  Result Date: 01/09/2018 INDICATION: 84 year old Howe with complex peripherally enhancing fluid collection in the right lower quadrant following complex abdominal surgery. Findings concerning for intra-abdominal abscess. She presents for image guided drain placement. EXAM: Ultrasound-guided drain placement MEDICATIONS: The patient is currently admitted to the hospital and receiving intravenous antibiotics. The antibiotics were administered within an appropriate time frame prior to the initiation of the procedure. ANESTHESIA/SEDATION: Fentanyl 25 mcg IV; Versed 1 mg IV Moderate Sedation Time:  12 minutes The patient was continuously monitored during the procedure by the interventional radiology nurse under my direct supervision. COMPLICATIONS: None immediate. PROCEDURE: Informed written consent was obtained from the patient after a thorough discussion of the procedural risks, benefits and  alternatives. All questions were addressed. Maximal Sterile Barrier Technique was utilized including caps, mask, sterile gowns, sterile gloves, sterile drape, hand hygiene and skin antiseptic. A timeout was performed prior to the initiation of the procedure. The right lower quadrant was interrogated with ultrasound. A complex fluid and gas collection is successfully and easily identified. A suitable skin entry site was selected and marked. Local anesthesia was attained by infiltration with 1% lidocaine. A small dermatotomy was made. Under real-time sonographic guidance, an 18 gauge trocar needle was carefully advanced into the fluid collection. A 0.035 Amplatz wire was then coiled in the fluid collection. The needle was removed. The skin tract was dilated to 65 [place] and a Cook 12 [place] all-purpose drainage catheter was advanced over the wire into the complex fluid collection. Aspiration yields bloody purulent fluid. This appears to be an infected hematoma. Aspiration and lavage were performed until approximately 90 mL of bloody purulent fluid was evacuated. The drain was then flushed a final time and connected to JP bulb suction before being secured to the skin with 0 Prolene suture. The patient tolerated the procedure well. IMPRESSION: Successful placement of a 12 French drainage catheter into the right lower quadrant fluid collection. The aspirate is bloody, purulent fluid most suggestive of an infected hematoma. A sample of the aspirate was sent for culture. Signed, Criselda Peaches, MD [facility] Electronically Signed   By: Jacqulynn Cadet M.D.   On: 01/09/2018 12:28    Anti-infectives: Anti-infectives (From admission, onward)   Start     Dose/Rate Route Frequency Ordered Stop   12/31/17 1300  piperacillin-tazobactam (ZOSYN) IVPB 3.375 g  3.375 g 12.5 mL/hr over 240 Minutes Intravenous Every 8 hours 12/31/17 1252         Assessment/Plan: s/p Procedure(s): DIAGNOSTIC LAPAROSCOPY/ LAPAROTOMY WITH SMALL BOWEL RESECTION TIMES TWO (N/A)  Drain for fluid collection.  Await microbiology DVT-therapeutic lovenox Continue TPN  Mobilize with tx as tolerated.    Severe protein calorie malnutrition.     LOS: 17 days   Stark Klein 01/10/2018

## 2018-01-11 LAB — CBC
HCT: 29.6 % — ABNORMAL LOW (ref 36.0–46.0)
HCT: 30.5 % — ABNORMAL LOW (ref 36.0–46.0)
HEMOGLOBIN: 9.3 g/dL — AB (ref 12.0–15.0)
HEMOGLOBIN: 9.5 g/dL — AB (ref 12.0–15.0)
MCH: 26.4 pg (ref 26.0–34.0)
MCH: 26.4 pg (ref 26.0–34.0)
MCHC: 31.4 g/dL (ref 30.0–36.0)
MCHC: 31.1 g/dL (ref 30.0–36.0)
MCV: 84.1 fL (ref 78.0–100.0)
MCV: 84.7 fL (ref 78.0–100.0)
PLATELETS: 1095 10*3/uL — AB (ref 150–400)
Platelets: 1131 10*3/uL (ref 150–400)
RBC: 3.52 MIL/uL — ABNORMAL LOW (ref 3.87–5.11)
RBC: 3.6 MIL/uL — ABNORMAL LOW (ref 3.87–5.11)
RDW: 17.5 % — ABNORMAL HIGH (ref 11.5–15.5)
RDW: 17.5 % — AB (ref 11.5–15.5)
WBC: 9.7 10*3/uL (ref 4.0–10.5)
WBC: 9.7 10*3/uL (ref 4.0–10.5)

## 2018-01-11 LAB — BASIC METABOLIC PANEL
Anion gap: 11 (ref 5–15)
BUN: 27 mg/dL — ABNORMAL HIGH (ref 6–20)
CHLORIDE: 105 mmol/L (ref 101–111)
CO2: 26 mmol/L (ref 22–32)
CREATININE: 0.74 mg/dL (ref 0.44–1.00)
Calcium: 8.8 mg/dL — ABNORMAL LOW (ref 8.9–10.3)
Glucose, Bld: 184 mg/dL — ABNORMAL HIGH (ref 65–99)
Potassium: 3.9 mmol/L (ref 3.5–5.1)
SODIUM: 142 mmol/L (ref 135–145)

## 2018-01-11 LAB — GLUCOSE, CAPILLARY
GLUCOSE-CAPILLARY: 197 mg/dL — AB (ref 65–99)
GLUCOSE-CAPILLARY: 158 mg/dL — AB (ref 65–99)
GLUCOSE-CAPILLARY: 141 mg/dL — AB (ref 65–99)
Glucose-Capillary: 146 mg/dL — ABNORMAL HIGH (ref 65–99)
Glucose-Capillary: 140 mg/dL — ABNORMAL HIGH (ref 65–99)

## 2018-01-11 MED ORDER — TRAVASOL 10 % IV SOLN
INTRAVENOUS | Status: AC
  Administered 2018-01-11: 19:00:00 via INTRAVENOUS
  Filled 2018-01-11: qty 904.8

## 2018-01-11 NOTE — Progress Notes (Signed)
PT Cancellation Note  Patient Details Name: Theresa Howe MRN: [id number] DOB: 1931-08-01   Cancelled Treatment:      Patient declined and still appears confused. PT will continue to follow acutely.   Salina April, PTA Pager: [phone]   01/11/2018, 1:35 PM

## 2018-01-11 NOTE — Progress Notes (Signed)
Referring Physician(s): Mamie Laurel  Supervising Physician: Aletta Edouard  Patient Status:  [facility] - In-pt  Chief Complaint:  RLQ abscess  Subjective:  1/23: Successful placement of a 12 French drainage catheter into the right lower quadrant fluid collection.   Sleeping; feeling better Less pain in abd Cl liq diet   Allergies: Patient has no known allergies.  Medications: Prior to Admission medications   Medication Sig Start Date End Date Taking? Authorizing Provider  acetaminophen (TYLENOL) 325 MG tablet Take 2 tablets (650 mg total) by mouth every 6 (six) hours as needed for mild pain (or Fever >/= 101). 12/13/17  Yes Earnstine Regal, PA-C  albuterol [facility] HFA) 108 (90 Base) MCG/ACT inhaler Inhale 2 puffs into the lungs every 6 (six) hours as needed for wheezing or shortness of breath. 12/23/15  Yes [place], Modena Nunnery, MD  aspirin 81 MG tablet Take 81 mg by mouth daily.     Yes [provider]  Calcium Carbonate-Vitamin D (CALCIUM 600 + D PO) Take 1 tablet by mouth daily.    Yes [provider]  collagenase (SANTYL) ointment Apply 1 application topically daily. Left buttock   Yes [provider]  docusate sodium (COLACE) 100 MG capsule Take 1 capsule (100 mg total) by mouth 2 (two) times daily. 12/13/17  Yes Earnstine Regal, PA-C  dorzolamide (TRUSOPT) 2 % ophthalmic solution Place 1 drop into both eyes 2 (two) times daily. 06/02/16  Yes [place], Modena Nunnery, MD  escitalopram (LEXAPRO) 10 MG tablet TAKE 1 TABLET AT BEDTIME Patient taking differently: TAKE 5mg  AT BEDTIME 12/03/17  Yes [place], Modena Nunnery, MD  feeding supplement, ENSURE ENLIVE, (ENSURE ENLIVE) LIQD Take 237 mLs by mouth 3 (three) times daily between meals. 12/13/17  Yes Earnstine Regal, PA-C  fluticasone [facility]) 50 MCG/ACT nasal spray USE 2 SPRAYS IN EACH       NOSTRIL DAILY 12/24/17  Yes [place], Modena Nunnery, MD  gabapentin (NEURONTIN) 100 MG capsule TAKE 1 TO 2 CAPSULES AT    BEDTIME  FOR NEUROPATHY Patient taking differently: Take 100 mg by mouth at bedtime.  12/03/17  Yes [place], Modena Nunnery, MD  latanoprost (XALATAN) 0.005 % ophthalmic solution Place 1 drop into both eyes at bedtime. 10/01/17  Yes [place], Modena Nunnery, MD  losartan-hydrochlorothiazide (HYZAAR) 50-12.5 MG tablet Take 1 tablet daily by mouth. 10/25/17  Yes [place], Modena Nunnery, MD  metFORMIN (GLUCOPHAGE) 1000 MG tablet Take 0.5 tablets (500 mg total) by mouth 2 (two) times daily with a meal. 12/26/16  Yes [place], Modena Nunnery, MD  mirtazapine (REMERON) 15 MG tablet Take 0.5 tablets (7.5 mg total) by mouth at bedtime. For appetite 10/04/17  Yes [place], Modena Nunnery, MD  Multiple Vitamins-Minerals (CENTRUM SILVER PO) Take 1 tablet by mouth daily.    Yes [provider]  omeprazole (PRILOSEC) 40 MG capsule Take 40 mg by mouth daily.   Yes [provider]  ondansetron (ZOFRAN) 4 MG tablet Take 4 mg by mouth every 6 (six) hours as needed for nausea or vomiting.   Yes [provider]  oxyCODONE (ROXICODONE) 5 MG/5ML solution Take 5 mLs (5 mg total) by mouth every 6 (six) hours. Patient taking differently: Take 5 mg by mouth every 6 (six) hours as needed for severe pain.  12/13/17  Yes Earnstine Regal, PA-C  polyethylene glycol [facility] / GLYCOLAX) packet Take 17 g by mouth daily as needed for moderate constipation. 12/13/17  Yes Earnstine Regal, PA-C  alendronate (FOSAMAX) 70 MG tablet TAKE 1  TABLET EVERY 7 DAYS WITH A FULL GLASS OF WATER ON AN EMPTY STOMACH 12/25/17   Alycia Rossetti, MD  ONE TOUCH ULTRA TEST test strip TEST FASTING BLOOD SUGAR ONCE DAILY **DX E11.4 09/10/17   Alycia Rossetti, MD     Vital Signs: BP (!) 143/72 (BP Location: Left Arm)   Pulse 96   Temp 98.8 F (37.1 C) (Oral)   Resp 16   Ht 5\' 5"  (1.651 m) Comment: 12/04/17  Wt 138 lb 14.2 oz (63 kg)   SpO2 100%   BMI 23.11 kg/m   Physical Exam  Neurological: She is alert.  Skin: Skin is warm and dry.  Site is clean and  dry NT no bleeding OP blood tinged cloudy fluid 50 cc OP yesterday; none recorded today--- 10 cc in JP abd overall soreness   Cx pending  Imaging: Ct Abdomen Pelvis W Contrast  Result Date: 01/07/2018 CLINICAL DATA:  84 year old female with diffuse abdominal pain EXAM: CT ABDOMEN AND PELVIS WITH CONTRAST TECHNIQUE: Multidetector CT imaging of the abdomen and pelvis was performed using the standard protocol following bolus administration of intravenous contrast. CONTRAST:  148mL ISOVUE-300 IOPAMIDOL (ISOVUE-300) INJECTION 61% COMPARISON:  Recent prior CT scan of the abdomen and pelvis 12/24/2017 FINDINGS: Lower chest: Mild dependent atelectasis. PICC line terminates in the right atrium. The heart is normal in size. No pericardial effusion. Unremarkable distal thoracic esophagus. Hepatobiliary: Normal hepatic contour and morphology. No discrete hepatic lesions. Normal appearance of the gallbladder. No intra or extrahepatic biliary ductal dilatation. Pancreas: Slight interval increase in dilatation of the main pancreatic duct to 6 mm compared to 4-5 mm previously. Additionally, there is a suggestion of a filling defect in the distal common bile duct concerning for choledocholithiasis. No significant pancreatic inflammation and no evidence of pancreatic mass. Spleen: Normal in size without focal abnormality. Adrenals/Urinary Tract: Normal adrenal glands. No hydronephrosis, enhancing renal mass or nephrolithiasis. Unremarkable ureters and bladder. Stomach/Bowel: The stomach is within normal limits. Evidence of prior small bowel resection. The anastomoses appear patent. No evidence of obstruction. Persistent peripherally enhancing fluid and gas collection in the right anatomic pelvis which has significantly decreased in size presently measuring approximately 8.7 x 4.4 cm compared to 11.3 by 9 cm. There is a small sinus tract extending toward the inferior end of the midline abdominal wound. A second small focus  of peripherally enhancing fluid is identified at the bladder dome measuring 1.8 cm. Small amount of loculated fluid within the right pericolic gutter measures 2.0 x 1.3 cm. Vascular/Lymphatic: Atherosclerotic vascular calcifications. No aneurysm. There is a focal filling defect within the right femoral vein in the visualized proximal thigh extending into the right common femoral vein consistent with acute nonocclusive DVT. Reproductive: Pelvic floor laxity. Pessary in place. Status post hysterectomy. No adnexal masses. Other: Intra-abdominal abscesses as described above. Musculoskeletal: No acute osseous abnormality. Severe multilevel degenerative disc disease with levoconvex scoliosis of the lumbar spine. IMPRESSION: 1. Acute nonocclusive DVT in the right common femoral and femoral vein in the proximal thigh. 2. Peripherally enhancing loculated fluid in the right lower quadrant most consistent with an intraabdominal abscess. A small amount of fluid tracks anteriorly to the open anterior abdominal incision. Additional small foci of loculated fluid present anterior to the bladder and in the right pericolic gutter. 3. Slight interval increase in dilatation of the main pancreatic duct with a suggestion of a filling defect in the distal common bile duct. Choledocholithiasis is not excluded. Recommend clinical correlation with serum  LFTs/bilirubin. 4. Additional ancillary findings as above without significant interval change compared to recent prior imaging. These results were called by telephone at the time of interpretation on 01/07/2018 at 4:50 pm to Dr. Stark Klein , who verbally acknowledged these results. Electronically Signed   By: Jacqulynn Cadet M.D.   On: 01/07/2018 16:52   Ir Image Guided Drainage Percut Cath  Peritoneal Retroperit  Result Date: 01/09/2018 INDICATION: 84 year old female with complex peripherally enhancing fluid collection in the right lower quadrant following complex abdominal surgery.  Findings concerning for intra-abdominal abscess. She presents for image guided drain placement. EXAM: Ultrasound-guided drain placement MEDICATIONS: The patient is currently admitted to the hospital and receiving intravenous antibiotics. The antibiotics were administered within an appropriate time frame prior to the initiation of the procedure. ANESTHESIA/SEDATION: Fentanyl 25 mcg IV; Versed 1 mg IV Moderate Sedation Time:  12 minutes The patient was continuously monitored during the procedure by the interventional radiology nurse under my direct supervision. COMPLICATIONS: None immediate. PROCEDURE: Informed written consent was obtained from the patient after a thorough discussion of the procedural risks, benefits and alternatives. All questions were addressed. Maximal Sterile Barrier Technique was utilized including caps, mask, sterile gowns, sterile gloves, sterile drape, hand hygiene and skin antiseptic. A timeout was performed prior to the initiation of the procedure. The right lower quadrant was interrogated with ultrasound. A complex fluid and gas collection is successfully and easily identified. A suitable skin entry site was selected and marked. Local anesthesia was attained by infiltration with 1% lidocaine. A small dermatotomy was made. Under real-time sonographic guidance, an 18 gauge trocar needle was carefully advanced into the fluid collection. A 0.035 Amplatz wire was then coiled in the fluid collection. The needle was removed. The skin tract was dilated to 65 [place] and a Cook 12 [place] all-purpose drainage catheter was advanced over the wire into the complex fluid collection. Aspiration yields bloody purulent fluid. This appears to be an infected hematoma. Aspiration and lavage were performed until approximately 90 mL of bloody purulent fluid was evacuated. The drain was then flushed a final time and connected to JP bulb suction before being secured to the skin with 0 Prolene suture. The patient  tolerated the procedure well. IMPRESSION: Successful placement of a 12 French drainage catheter into the right lower quadrant fluid collection. The aspirate is bloody, purulent fluid most suggestive of an infected hematoma. A sample of the aspirate was sent for culture. Signed, Criselda Peaches, MD [facility] Electronically Signed   By: Jacqulynn Cadet M.D.   On: 01/09/2018 12:28    Labs:  CBC: Recent Labs    01/06/18 0458 01/07/18 0414 01/08/18 0518 01/11/18 0429  WBC 14.6* 12.8* 11.2* 9.7  HGB 9.5* 8.5* 8.5* 9.3*  HCT 29.5* 27.1* 26.9* 29.6*  PLT 852* 904* 955* 1,095*    COAGS: Recent Labs    11/28/17 1220 12/24/17 1301 01/08/18 1452  INR 1.08 1.25 1.19    BMP: Recent Labs    01/06/18 0458 01/07/18 0414 01/10/18 0401 01/11/18 0429  NA 138 141 142 142  K 3.7 3.9 3.8 3.9  CL 103 105 106 105  CO2 24 25 25 26   GLUCOSE 229* 140* 139* 184*  BUN 22* 26* 27* 27*  CALCIUM 8.5* 8.5* 8.8* 8.8*  CREATININE 0.71 0.74 0.72 0.74  GFRNONAA >60 >60 >60 >60  GFRAA >60 >60 >60 >60    LIVER FUNCTION TESTS: Recent Labs    12/31/17 0628 01/03/18 0433  01/07/18 0414 01/10/18 0401  BILITOT 1.5* 1.4* 1.1 0.6  AST 43* 22 37 27  ALT 30 18 34 32  ALKPHOS 221* 216* 234* 182*  PROT 4.4* 4.6* 5.5* 5.7*  ALBUMIN 1.3* 1.3* 1.5* 1.7*    Assessment and Plan:  RLQ abscess drain Draining well Feeling better Will follow Will need Re CT when OP less than 10 cc/day Flush 5-10 cc sterile saline daily if home with drain  Electronically Signed: Ketrina Boateng A, PA-C 01/11/2018, 8:52 AM   I spent a total of 15 Minutes at the the patient's bedside AND on the patient's hospital floor or unit, greater than 50% of which was counseling/coordinating care for RLQ abscess drain

## 2018-01-11 NOTE — Progress Notes (Addendum)
PHARMACY - ADULT TOTAL PARENTERAL NUTRITION CONSULT NOTE   Pharmacy Consult: TPN Indication: Intolerance to enteral feeding  Patient Measurements: Height: '5\' 5"'  (165.1 cm)(12/04/17) Weight: 138 lb 14.2 oz (63 kg) IBW/kg (Calculated) : 57 TPN AdjBW (KG): 57.6 Body mass index is 23.11 kg/m.  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  Patient was re-admitted from SNF after she began having nausea and vomiting and unable to tolerate enteral feeding.  Found to have a large dilated segment of bowel with bezoar and necrosis, now s/p SBR x 2.  Pharmacy managing TPN for nutritional support.  GI: SBR x 2 this admit. prealbumin 5.3 (1/14) >> 15 (1/21), LBM 1/24, RLQ drain O/P 21m - will need to repeat CT when output <10cc/day - IV PPI, PRN Zofran/Phenergan.  Possible choledocholithiasis on CT, Clear liquid diet started 1/23 - only eating 0-10% as pt c/o pain with eating.  Endo: DM on metformin PTA. CBGs [id number]  Relying mostly on SSI to cover for hyperglycemia instead of increasing insulin in TPN and expose pt to risk of hypoglycemia.  Insulin requirements in the past 24 hours: 12 units SSI + 30 units in TPN Lytes: all WNL (Mag trending down) Renal: SCr 0.74 stable, BUN up 27 - UOP not accurately charted, net +13.6L since admit, LR at [facility] stable on RA Cards: VSS - ASA AC: Lovenox for acute nonocclusive DVT on 1/21 CT - hgb low but stable, plts elevated 1000s Hepatobil: alk phos elevated, AST/ALT/tbili WNL.  TG WNL. Neuro: confused - Lexapro, Remeron, s/p Haldol for sundowning (QTc 4447m ID: Zosyn (1/14 >> ) for IAI. 1/21 CT showed abscess - afebrile, WBC wnl TPN Access: PICC placed 12/24/17>> pulled out 1/17; replaced 01/04/18 TPN start date: 12/25/17>>1/17 (lost PICC); 01/04/18  Nutritional Goals (per RD rec on 1/25): 1500-1700 kCal and 75-90 gram protein per day, fluid >1.5L  Current Nutrition: TPN  Clear liquid diet started  1/23  Plan:  Continue TPN at 65 ml/hr, providing 90g AA, 234g CHO, and 37g ILE, which equals to 1532 kCals per day, meeting 100% of patient needs. Electrolytes in TPN: Cl:Ac 1:2, K increased 1/24, Mag providing 22 mEq/d (max given calcium provision) Daily multivitamin and trace elements in TPN Continue moderate Q4H + 30 units regular insulin in TPN F/U PO intake/diet advancement to start weaning TPN  Continue Lovenox 6055mQ Q12H Continue Zosyn 3.375g IV every 8 hours (infused over 4 hours)  Weekly CBC, monitor new cultures for narrowing, renal function  Thank you for allowing pharmacy to be a part of this patient's care.  EliAlycia RossettiharmD, BCPS Clinical Pharmacist Pager: [phone] phone for 01/11/2018 from 7a-3:30p: [phone] after 3:30p, please call main pharmacy at: [phone] 01/11/2018 9:06 AM

## 2018-01-11 NOTE — Progress Notes (Signed)
15 Days Post-Op   Subjective/Chief Complaint: Culture still pending.  Pt reports mild nausea.  + flatus and BM.  Objective: Vital signs in last 24 hours: Temp:  [98.3 F (36.8 C)-98.8 F (37.1 C)] 98.8 F (37.1 C) (01/25 0444) Pulse Rate:  [95-96] 96 (01/25 0444) Resp:  [15-16] 16 (01/25 0444) BP: (127-143)/(67-79) 143/72 (01/25 0444) SpO2:  [98 %-100 %] 100 % (01/25 0444) Last BM Date: 01/10/18  Intake/Output from previous day: 01/24 0701 - 01/25 0700 In: 1103.2 [P.O.:60; I.V.:983.2; IV Piggyback:50] Out: 50 [Drains:50] Intake/Output this shift: No intake/output data recorded.  General appearance: much more awake and alert. GI: soft, nt, nd, incision - c/d/i. Drain less murky  Lab Results:  Recent Labs    01/11/18 0429  WBC 9.7  HGB 9.3*  HCT 29.6*  PLT 1,095*   BMET Recent Labs    01/10/18 0401 01/11/18 0429  NA 142 142  K 3.8 3.9  CL 106 105  CO2 25 26  GLUCOSE 139* 184*  BUN 27* 27*  CREATININE 0.72 0.74  CALCIUM 8.8* 8.8*   Studies/Results: Ir Image Guided Drainage Percut Cath  Peritoneal Retroperit  Result Date: 01/09/2018 INDICATION: 84 year old female with complex peripherally enhancing fluid collection in the right lower quadrant following complex abdominal surgery. Findings concerning for intra-abdominal abscess. She presents for image guided drain placement. EXAM: Ultrasound-guided drain placement MEDICATIONS: The patient is currently admitted to the hospital and receiving intravenous antibiotics. The antibiotics were administered within an appropriate time frame prior to the initiation of the procedure. ANESTHESIA/SEDATION: Fentanyl 25 mcg IV; Versed 1 mg IV Moderate Sedation Time:  12 minutes The patient was continuously monitored during the procedure by the interventional radiology nurse under my direct supervision. COMPLICATIONS: None immediate. PROCEDURE: Informed written consent was obtained from the patient after a thorough discussion of the  procedural risks, benefits and alternatives. All questions were addressed. Maximal Sterile Barrier Technique was utilized including caps, mask, sterile gowns, sterile gloves, sterile drape, hand hygiene and skin antiseptic. A timeout was performed prior to the initiation of the procedure. The right lower quadrant was interrogated with ultrasound. A complex fluid and gas collection is successfully and easily identified. A suitable skin entry site was selected and marked. Local anesthesia was attained by infiltration with 1% lidocaine. A small dermatotomy was made. Under real-time sonographic guidance, an 18 gauge trocar needle was carefully advanced into the fluid collection. A 0.035 Amplatz wire was then coiled in the fluid collection. The needle was removed. The skin tract was dilated to 81 [place] and a Cook 12 [place] all-purpose drainage catheter was advanced over the wire into the complex fluid collection. Aspiration yields bloody purulent fluid. This appears to be an infected hematoma. Aspiration and lavage were performed until approximately 90 mL of bloody purulent fluid was evacuated. The drain was then flushed a final time and connected to JP bulb suction before being secured to the skin with 0 Prolene suture. The patient tolerated the procedure well. IMPRESSION: Successful placement of a 12 French drainage catheter into the right lower quadrant fluid collection. The aspirate is bloody, purulent fluid most suggestive of an infected hematoma. A sample of the aspirate was sent for culture. Signed, Criselda Peaches, MD [facility] Electronically Signed   By: Jacqulynn Cadet M.D.   On: 01/09/2018 12:28    Anti-infectives: Anti-infectives (From admission, onward)   Start     Dose/Rate Route Frequency Ordered Stop   12/31/17 1300  piperacillin-tazobactam (  ZOSYN) IVPB 3.375 g     3.375 g 12.5 mL/hr over 240 Minutes Intravenous Every 8 hours  12/31/17 1252        Assessment/Plan: s/p Procedure(s): DIAGNOSTIC LAPAROSCOPY/ LAPAROTOMY WITH SMALL BOWEL RESECTION TIMES TWO (N/A)  Drain for fluid collection.  Await microbiology DVT-therapeutic lovenox Continue TPN  Mobilize with tx as tolerated.   Pt did not want to advance diet given nausea.   Severe protein calorie malnutrition.     LOS: 18 days   Theresa Howe 01/11/2018

## 2018-01-12 LAB — CBC
HEMATOCRIT: 30.8 % — AB (ref 36.0–46.0)
HEMOGLOBIN: 9.5 g/dL — AB (ref 12.0–15.0)
MCH: 26.2 pg (ref 26.0–34.0)
MCHC: 30.8 g/dL (ref 30.0–36.0)
MCV: 85.1 fL (ref 78.0–100.0)
Platelets: 1128 10*3/uL (ref 150–400)
RBC: 3.62 MIL/uL — AB (ref 3.87–5.11)
RDW: 17.6 % — ABNORMAL HIGH (ref 11.5–15.5)
WBC: 9.6 10*3/uL (ref 4.0–10.5)

## 2018-01-12 LAB — BASIC METABOLIC PANEL
ANION GAP: 12 (ref 5–15)
BUN: 25 mg/dL — AB (ref 6–20)
CHLORIDE: 107 mmol/L (ref 101–111)
CO2: 25 mmol/L (ref 22–32)
Calcium: 9.1 mg/dL (ref 8.9–10.3)
Creatinine, Ser: 0.77 mg/dL (ref 0.44–1.00)
GFR calc Af Amer: >60 mL/min (ref >60)
Glucose, Bld: 146 mg/dL — ABNORMAL HIGH (ref 65–99)
Potassium: 3.9 mmol/L (ref 3.5–5.1)
Sodium: 144 mmol/L (ref 135–145)

## 2018-01-12 LAB — GLUCOSE, CAPILLARY
GLUCOSE-CAPILLARY: 195 mg/dL — AB (ref 65–99)
Glucose-Capillary: 142 mg/dL — ABNORMAL HIGH (ref 65–99)
Glucose-Capillary: 143 mg/dL — ABNORMAL HIGH (ref 65–99)
Glucose-Capillary: 154 mg/dL — ABNORMAL HIGH (ref 65–99)
Glucose-Capillary: 167 mg/dL — ABNORMAL HIGH (ref 65–99)
Glucose-Capillary: 169 mg/dL — ABNORMAL HIGH (ref 65–99)
Glucose-Capillary: 220 mg/dL — ABNORMAL HIGH (ref 65–99)

## 2018-01-12 MED ORDER — TRAVASOL 10 % IV SOLN
INTRAVENOUS | Status: DC
  Filled 2018-01-12: qty 904.8

## 2018-01-12 MED ORDER — POTASSIUM PHOSPHATES 150 MMOLE/50ML IV SOLN
INTRAVENOUS | Status: AC
Start: 2018-01-12 — End: 2018-01-13
  Administered 2018-01-12: 18:00:00 via INTRAVENOUS
  Filled 2018-01-12: qty 904.8

## 2018-01-12 MED ORDER — ENSURE ENLIVE PO LIQD
237.0000 mL | Freq: Two times a day (BID) | ORAL | Status: DC
  Administered 2018-01-12 – 2018-01-13 (×2): 237 mL via ORAL

## 2018-01-12 NOTE — Progress Notes (Signed)
PHARMACY - ADULT TOTAL PARENTERAL NUTRITION CONSULT NOTE   Pharmacy Consult: TPN Indication: Intolerance to enteral feeding  Patient Measurements: Height: _0  (165.1 cm)(12/04/17) Weight: 138 lb 14.2 oz (63 kg) IBW/kg (Calculated) : 57 TPN AdjBW (KG): 57.6 Body mass index is 23.11 kg/m.  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  Patient was re-admitted from SNF after she began having nausea and vomiting and unable to tolerate enteral feeding.  Found to have a large dilated segment of bowel with bezoar and necrosis, now s/p SBR x 2.  Pharmacy managing TPN for nutritional support.  GI: prealbumin 5.3 >> 15, LBM 1/25, RLQ drain O/P 60m - IV PPI, PRN Zofran/Phenergan.  Possible choledocholithiasis on CT, repeat CT once O/P < 10 ml/day Endo: DM on metformin PTA. CBGs controlled.  Relying mostly on SSI to cover for hyperglycemia instead of increasing insulin in TPN and expose pt to risk of hypoglycemia.  Insulin requirements in the past 24 hours: 12 units SSI + 30 units in TPN Lytes: all WNL except CoCa 10.94 Renal: SCr 0.77 stable, BUN 25 - UOP 0.3 ml/kg/hr, net +13.6L since admit, [facility] on RA Cards: VSS - ASA AC: Lovenox for acute nonocclusive DVT on 1/21 CT - hgb low but stable, plts elevated 1128 Hepatobil: alk phos elevated, AST/ALT/tbili WNL.  TG WNL. Neuro: confused - Lexapro, Remeron, s/p Haldol for sundowning (QTc 4443m ID: Zosyn (1/14 >> ) for IAI. 1/21 CT showed abscess - afebrile, WBC down to 11.2 TPN Access: PICC placed 12/24/17>> pulled out 1/17; replaced 01/04/18 TPN start date: 12/25/17  Nutritional Goals (per RD rec on 1/21): 1500-1700 kCal and 75-90 gram protein per day  Current Nutrition: TPN  Clear liquid diet started 1/23 (minimal intake, c/o nausea)   Plan:  Continue TPN at 65 ml/hr, providing 90g AA, 234g CHO, and 37g ILE, which equals to 1532 kCals per day, meeting 100% of patient  needs. Electrolytes in TPN: Cl:Ac 1:2, increase K 1/24, Mag providing 22 mEq/d, reduce calcium 1/26 Daily multivitamin and trace elements in TPN Continue moderate Q4H + 30 units regular insulin in TPN F/U PO intake/diet advancement to start weaning TPN vs cycle TPN if diet cannot be advanced  Continue Lovenox 6052mQ Q12H Weekly CBC   Theresa Howe D. DanMina MarbleharmD, BCPS Pager:  [phone], 8:25 AM

## 2018-01-12 NOTE — Progress Notes (Signed)
Progress Note: General Surgery Service   Assessment/Plan: Patient Active Problem List   Diagnosis Date Noted  . Failure to thrive (0-17) 12/24/2017  . Protein-calorie malnutrition, severe 12/06/2017  . Small bowel mass 12/04/2017  . Protein-calorie malnutrition ([place]) 09/11/2017  . Weight loss 09/11/2017  . Diabetic neuropathy ([place]) 09/28/2015  . Hip pain 09/21/2014  . Muscle ache of extremity 06/12/2014  . Anxiety state 10/06/2013  . Bladder prolapse, female, acquired 08/03/2013  . Osteoporosis, unspecified 08/03/2013  . Nonspecific (abnormal) findings on radiological and other examination of biliary tract 04/17/2013  . Diabetes with neurologic complications ([place]) 16/09/9603  . Hypertension associated with diabetes ([place]) 09/18/2011  . Hyperlipidemia LDL goal <70 09/18/2011  . Glaucoma 09/18/2011   s/p Procedure(s): DIAGNOSTIC LAPAROSCOPY/ LAPAROTOMY WITH SMALL BOWEL RESECTION TIMES TWO 12/27/2017 Advance to full liquids and ensure Continue TPN    LOS: 19 days  Chief Complaint/Subjective: Pain controlled, tolerating liquids, +BM yesterday  Objective: Vital signs in last 24 hours: Temp:  [98.2 F (36.8 C)-99 F (37.2 C)] 98.6 F (37 C) (01/26 0512) Pulse Rate:  [93-106] 97 (01/26 0512) Resp:  [16-18] 17 (01/26 0512) BP: (144-149)/(75-93) 146/93 (01/26 0512) SpO2:  [100 %] 100 % (01/26 0512) Last BM Date: 01/11/18  Intake/Output from previous day: 01/25 0701 - 01/26 0700 In: 1715.8 [P.O.:120; I.V.:1435.8; IV Piggyback:150] Out: 31 [Urine:1; Drains:30] Intake/Output this shift: No intake/output data recorded.  Lungs: CTAB  Cardiovascular: RRR  Abd: soft, NT, open wound with clean base  Extremities: no edema  Neuro: AOx4  Lab Results: CBC  Recent Labs    01/11/18 1531 01/12/18 0422  WBC 9.7 9.6  HGB 9.5* 9.5*  HCT 30.5* 30.8*  PLT 1,131* 1,128*   BMET Recent Labs    01/11/18 0429 01/12/18 0422  NA 142 144  K 3.9 3.9  CL 105 107  CO2 26 25    GLUCOSE 184* 146*  BUN 27* 25*  CREATININE 0.74 0.77  CALCIUM 8.8* 9.1   PT/INR No results for input(s): LABPROT, INR in the last 72 hours. ABG No results for input(s): PHART, HCO3 in the last 72 hours.  Invalid input(s): PCO2, PO2  Studies/Results:  Anti-infectives: Anti-infectives (From admission, onward)   Start     Dose/Rate Route Frequency Ordered Stop   12/31/17 1300  piperacillin-tazobactam (ZOSYN) IVPB 3.375 g     3.375 g 12.5 mL/hr over 240 Minutes Intravenous Every 8 hours 12/31/17 1252        Medications: Scheduled Meds: . aspirin  81 mg Oral Daily  . chlorhexidine  15 mL Mouth Rinse BID  . collagenase  1 application Topical Daily  . dorzolamide  1 drop Both Eyes BID  . enoxaparin (LOVENOX) injection  60 mg Subcutaneous Q12H  . escitalopram  5 mg Oral QHS  . feeding supplement (ENSURE ENLIVE)  237 mL Oral BID BM  . fluticasone  2 spray Each Nare Daily  . insulin aspart  0-15 Units Subcutaneous Q4H  . latanoprost  1 drop Both Eyes QHS  . mouth rinse  15 mL Mouth Rinse q12n4p  . mirtazapine  7.5 mg Oral QHS  . pantoprazole (PROTONIX) IV  40 mg Intravenous QHS  . sodium chloride flush  5 mL Intravenous Q8H   Continuous Infusions: . sodium chloride    . sodium chloride    . lactated ringers 10 mL/hr at 01/07/18 0454  . piperacillin-tazobactam (ZOSYN)  IV 3.375 g (01/12/18 0515)  . TPN ADULT (ION) 65 mL/hr at 01/11/18 1839  .  TPN ADULT (ION)     PRN Meds:.acetaminophen **OR** acetaminophen, albuterol, diphenhydrAMINE **OR** diphenhydrAMINE, fentaNYL, hydrALAZINE, lidocaine (PF), midazolam, morphine injection, ondansetron **OR** ondansetron (ZOFRAN) IV, prochlorperazine **OR** prochlorperazine, sodium chloride flush, sodium chloride flush  Mickeal Skinner, MD Pg# [phone] [facility]

## 2018-01-13 LAB — BASIC METABOLIC PANEL
Anion gap: 11 (ref 5–15)
BUN: 26 mg/dL — ABNORMAL HIGH (ref 6–20)
CALCIUM: 8.9 mg/dL (ref 8.9–10.3)
CHLORIDE: 109 mmol/L (ref 101–111)
CO2: 25 mmol/L (ref 22–32)
CREATININE: 0.78 mg/dL (ref 0.44–1.00)
GFR calc non Af Amer: >60 mL/min (ref >60)
Glucose, Bld: 165 mg/dL — ABNORMAL HIGH (ref 65–99)
Potassium: 3.9 mmol/L (ref 3.5–5.1)
Sodium: 145 mmol/L (ref 135–145)

## 2018-01-13 LAB — GLUCOSE, CAPILLARY
GLUCOSE-CAPILLARY: 153 mg/dL — AB (ref 65–99)
GLUCOSE-CAPILLARY: 120 mg/dL — AB (ref 65–99)
Glucose-Capillary: 125 mg/dL — ABNORMAL HIGH (ref 65–99)
Glucose-Capillary: 152 mg/dL — ABNORMAL HIGH (ref 65–99)
Glucose-Capillary: 161 mg/dL — ABNORMAL HIGH (ref 65–99)

## 2018-01-13 LAB — CBC
HCT: 28.9 % — ABNORMAL LOW (ref 36.0–46.0)
HEMATOCRIT: 29.4 % — AB (ref 36.0–46.0)
HEMOGLOBIN: 9 g/dL — AB (ref 12.0–15.0)
Hemoglobin: 8.9 g/dL — ABNORMAL LOW (ref 12.0–15.0)
MCH: 26.2 pg (ref 26.0–34.0)
MCH: 26.6 pg (ref 26.0–34.0)
MCHC: 30.6 g/dL (ref 30.0–36.0)
MCHC: 30.8 g/dL (ref 30.0–36.0)
MCV: 85.7 fL (ref 78.0–100.0)
MCV: 86.5 fL (ref 78.0–100.0)
PLATELETS: 885 10*3/uL — AB (ref 150–400)
Platelets: 991 10*3/uL (ref 150–400)
RBC: 3.43 MIL/uL — ABNORMAL LOW (ref 3.87–5.11)
RBC: 3.34 MIL/uL — ABNORMAL LOW (ref 3.87–5.11)
RDW: 17.9 % — AB (ref 11.5–15.5)
RDW: 18.2 % — ABNORMAL HIGH (ref 11.5–15.5)
WBC: 8.7 10*3/uL (ref 4.0–10.5)
WBC: 8 10*3/uL (ref 4.0–10.5)

## 2018-01-13 MED ORDER — TRAVASOL 10 % IV SOLN
INTRAVENOUS | Status: AC
  Administered 2018-01-13: 18:00:00 via INTRAVENOUS
  Filled 2018-01-13: qty 696

## 2018-01-13 MED ORDER — ENOXAPARIN SODIUM 40 MG/0.4ML ~~LOC~~ SOLN
40.0000 mg | Freq: Two times a day (BID) | SUBCUTANEOUS | Status: DC
  Administered 2018-01-13 – 2018-01-15 (×4): 40 mg via SUBCUTANEOUS
  Filled 2018-01-13 (×4): qty 0.4

## 2018-01-13 NOTE — Progress Notes (Addendum)
PHARMACY - ADULT TOTAL PARENTERAL NUTRITION CONSULT NOTE   Pharmacy Consult: TPN Indication: Intolerance to enteral feeding  Patient Measurements: Height: '5\' 5"'  (165.1 cm)(12/04/17) Weight: 138 lb 14.2 oz (63 kg) IBW/kg (Calculated) : 57 TPN AdjBW (KG): 57.6 Body mass index is 23.11 kg/m.  Assessment:  74  YOM with stage IV neuroendocrine small bowel cancer s/p ex-lap with SBR in December.  Patient was re-admitted from SNF after she began having nausea and vomiting and unable to tolerate enteral feeding.  Found to have a large dilated segment of bowel with bezoar and necrosis, now s/p SBR x 2.  Pharmacy managing TPN for nutritional support.  Patient states she eats up to 10% of her meals.  She feels full but promised she will try to eat more.  GI: prealbumin 5.3 >> 15, LBM 1/25, RLQ drain O/P 5m - IV PPI, PRN Zofran/Phenergan.  Possible choledocholithiasis on CT, repeat CT once O/P < 10 ml/day Endo: DM on metformin PTA. CBGs acceptable.  Relying mostly on SSI to cover for hyperglycemia instead of increasing insulin in TPN and expose pt to risk of hypoglycemia.  Insulin requirements in the past 24 hours: 17 units SSI + 30 units in TPN Lytes: all WNL except CoCa 10.74 Renal: SCr 0.78 stable, BUN 26 - UOP 0.3 ml/kg/hr, net +13.6L since admit, [place] stable on RA Cards: VSS - ASA AC: Lovenox 656mSQ BID for acute nonocclusive DVT on 1/21 CT - hgb low but stable, plts down to 991.  Vaginal bleeding 1/27 so Lovenox reduced to 4030mQ Q12H by MD. Hepatobil: alk phos elevated, AST/ALT/tbili WNL.  TG WNL. Neuro: confused - Lexapro, Remeron, s/p Haldol for sundowning (QTc 447m25mD: Zosyn (1/14 >> ) for IAI. 1/21 CT showed abscess - afebrile, WBC down to 11.2 TPN Access: PICC placed 12/24/17>> pulled out 1/17; replaced 01/04/18 TPN start date: 12/25/17  Nutritional Goals (per RD rec on 1/21): 1500-1700 kCal and 75-90 gram protein per day  Current Nutrition: TPN  Full liquid diet  (minimal intake, </= 10% of meals) Ensure BID (received 1 yesterday = 350kCal and 20gm protein)   Plan:  Reduce TPN to 50 ml/hr (goal rate 65 ml/hr) TPN will provide 70g AA, 234g CHO, and 29g ILE, which equals to 1178 kCals per day, meeting >80% of patient needs.  PO intake/supplements to provide the rest of patient's needs. Electrolytes in TPN: Cl:Ac 1:2, Mag providing 22 mEq/d, reduce calcium 1/26 Daily multivitamin and trace elements in TPN Continue moderate SSI Q4H + reduce insulin in TPN to 25 units (decrease TPN rate) F/U PO intake/diet advancement to wean TPN further F/U AM labs, resolution of bleeding   Nitesh Pitstick D. DangMina MarblearmD, BCPS Pager:  [phone]/2019, 9:35 AM

## 2018-01-13 NOTE — Progress Notes (Signed)
Results for Theresa Howe, Theresa Howe (MRN [id number]) as of 01/13/2018 05:50  Ref. Range 01/13/2018 04:06  Platelets Latest Ref Range: 150 - 400 K/uL 991 (HH)

## 2018-01-13 NOTE — Progress Notes (Signed)
Called into room by NT and pt's diaper brief and pad were again saturated with a bloody drainage, but no obvious source and no c/o pain.  While assisting NT in cleaning pt, visualized the bloody drainage rapidly coming from vagina area and appears that it could be bloody urine.  Purewick placed by NT to collect sample if needed.  MD notified and no new orders received.  Will continue to monitor.  Eliezer Bottom [place]

## 2018-01-13 NOTE — Progress Notes (Signed)
Pt had a large amount of bloody fluid that saturated diaper brief and onto pad.  Assesed pt and noted no drainage coming from sacral wound, but some minimal amount of bloody drainage from vagina. Pt has no complaints of pain.  MD notified and assessed pt as well.  Eliezer Bottom [place]

## 2018-01-13 NOTE — Progress Notes (Signed)
Referring Physician(s): Mamie Laurel  Supervising Physician: Aletta Edouard  Patient Status:  [facility] - In-pt  Chief Complaint:  RLQ abscess  Subjective: S/p  placement of a 12 French drainage catheter into the right lower quadrant fluid collection 1/23  Sleeping; feeling better. C/o back soreness this am. Trying to eat some clears   Allergies: Patient has no known allergies.  Medications:  Current Facility-Administered Medications:  .  0.9 %  sodium chloride infusion, , Intravenous, Once, Stark Klein, MD .  0.9 %  sodium chloride infusion, 10 mL/hr, Intravenous, Once, Turner, Atlee Abide, CRNA .  acetaminophen (TYLENOL) tablet 650 mg, 650 mg, Oral, Q6H PRN, 650 mg at 01/05/18 1253 **OR** acetaminophen (TYLENOL) suppository 650 mg, 650 mg, Rectal, Q6H PRN, Stark Klein, MD, 650 mg at 12/31/17 1600 .  albuterol (PROVENTIL) (2.5 MG/3ML) 0.083% nebulizer solution 3 mL, 3 mL, Inhalation, Q6H PRN, Stark Klein, MD .  aspirin chewable tablet 81 mg, 81 mg, Oral, Daily, Stark Klein, MD, 81 mg at 01/12/18 1050 .  chlorhexidine (PERIDEX) 0.12 % solution 15 mL, 15 mL, Mouth Rinse, BID, Stark Klein, MD, 15 mL at 01/12/18 1050 .  collagenase (SANTYL) ointment 1 application, 1 application, Topical, Daily, Stark Klein, MD, 1 application at 31/54/00 1052 .  diphenhydrAMINE (BENADRYL) 12.5 MG/5ML elixir 12.5 mg, 12.5 mg, Oral, Q6H PRN, 12.5 mg at 12/29/17 2224 **OR** diphenhydrAMINE (BENADRYL) injection 12.5 mg, 12.5 mg, Intravenous, Q6H PRN, Stark Klein, MD, 12.5 mg at 12/30/17 2150 .  dorzolamide (TRUSOPT) 2 % ophthalmic solution 1 drop, 1 drop, Both Eyes, BID, Stark Klein, MD, 1 drop at 01/12/18 2204 .  enoxaparin (LOVENOX) injection 60 mg, 60 mg, Subcutaneous, Q12H, Dang, Thuy D, RPH, 60 mg at 01/13/18 0825 .  escitalopram (LEXAPRO) tablet 5 mg, 5 mg, Oral, QHS, Stark Klein, MD, 5 mg at 01/12/18 2148 .  feeding supplement (ENSURE ENLIVE) (ENSURE ENLIVE) liquid 237 mL, 237 mL,  Oral, BID BM, Kinsinger, Arta Bruce, MD, 237 mL at 01/12/18 1051 .  fentaNYL (SUBLIMAZE) injection, , Intravenous, PRN, Jacqulynn Cadet, MD, 25 mcg at 01/09/18 1139 .  fluticasone (FLONASE) 50 MCG/ACT nasal spray 2 spray, 2 spray, Each Nare, Daily, Stark Klein, MD, 2 spray at 01/12/18 1051 .  hydrALAZINE (APRESOLINE) injection 10 mg, 10 mg, Intravenous, Q2H PRN, Stark Klein, MD .  insulin aspart (novoLOG) injection 0-15 Units, 0-15 Units, Subcutaneous, Q4H, Tyrone Apple, RPH, 3 Units at 01/13/18 0825 .  lactated ringers infusion, , Intravenous, Continuous, Stark Klein, MD, Last Rate: 10 mL/hr at 01/07/18 0454 .  latanoprost (XALATAN) 0.005 % ophthalmic solution 1 drop, 1 drop, Both Eyes, QHS, Stark Klein, MD, 1 drop at 01/12/18 2205 .  lidocaine (PF) (XYLOCAINE) 1 % injection, , , PRN, Jacqulynn Cadet, MD, 5 mL at 01/09/18 1153 .  MEDLINE mouth rinse, 15 mL, Mouth Rinse, q12n4p, Stark Klein, MD, 15 mL at 01/11/18 1600 .  midazolam (VERSED) injection, , Intravenous, PRN, Jacqulynn Cadet, MD, 1 mg at 01/09/18 1139 .  mirtazapine (REMERON) tablet 7.5 mg, 7.5 mg, Oral, QHS, Stark Klein, MD, 7.5 mg at 01/12/18 2148 .  morphine 4 MG/ML injection 1-2 mg, 1-2 mg, Intravenous, Q2H PRN, Stark Klein, MD, 2 mg at 01/07/18 1238 .  ondansetron (ZOFRAN-ODT) disintegrating tablet 4 mg, 4 mg, Oral, Q6H PRN **OR** ondansetron (ZOFRAN) injection 4 mg, 4 mg, Intravenous, Q6H PRN, Stark Klein, MD, 4 mg at 01/06/18 0445 .  pantoprazole (PROTONIX) injection 40 mg, 40 mg, Intravenous, QHS, Stark Klein, MD, 40 mg  at 01/12/18 2148 .  piperacillin-tazobactam (ZOSYN) IVPB 3.375 g, 3.375 g, Intravenous, Q8H, Millen, Jessica B, RPH, Last Rate: 12.5 mL/hr at 01/13/18 0516, 3.375 g at 01/13/18 0516 .  prochlorperazine (COMPAZINE) tablet 10 mg, 10 mg, Oral, Q6H PRN **OR** prochlorperazine (COMPAZINE) injection 5-10 mg, 5-10 mg, Intravenous, Q6H PRN, Stark Klein, MD, 5 mg at 12/30/17 1521 .  sodium chloride  flush (NS) 0.9 % injection 10-40 mL, 10-40 mL, Intracatheter, PRN, Stark Klein, MD, 10 mL at 01/11/18 1531 .  sodium chloride flush (NS) 0.9 % injection 10-40 mL, 10-40 mL, Intracatheter, PRN, Stark Klein, MD .  sodium chloride flush (NS) 0.9 % injection 5 mL, 5 mL, Intravenous, Q8H, Jacqulynn Cadet, MD, 5 mL at 01/12/18 2208 .  TPN ADULT (ION), , Intravenous, Continuous TPN, Tyrone Apple, RPH, Last Rate: 65 mL/hr at 01/13/18 0100    Vital Signs: BP 124/60 (BP Location: Left Arm)   Pulse 96   Temp 98.5 F (36.9 C) (Oral)   Resp 16   Ht 5\' 5"  (1.651 m) Comment: 12/04/17  Wt 138 lb 14.2 oz (63 kg)   SpO2 99%   BMI 23.11 kg/m   Physical Exam  Neurological: She is alert.  Skin: Skin is warm and dry.  Site is clean and dry NT no bleeding OP blood tinged cloudy fluid    Cx pending  Imaging: Ir Image Guided Drainage Percut Cath  Peritoneal Retroperit  Result Date: 01/09/2018 INDICATION: 84 year old female with complex peripherally enhancing fluid collection in the right lower quadrant following complex abdominal surgery. Findings concerning for intra-abdominal abscess. She presents for image guided drain placement. EXAM: Ultrasound-guided drain placement MEDICATIONS: The patient is currently admitted to the hospital and receiving intravenous antibiotics. The antibiotics were administered within an appropriate time frame prior to the initiation of the procedure. ANESTHESIA/SEDATION: Fentanyl 25 mcg IV; Versed 1 mg IV Moderate Sedation Time:  12 minutes The patient was continuously monitored during the procedure by the interventional radiology nurse under my direct supervision. COMPLICATIONS: None immediate. PROCEDURE: Informed written consent was obtained from the patient after a thorough discussion of the procedural risks, benefits and alternatives. All questions were addressed. Maximal Sterile Barrier Technique was utilized including caps, mask, sterile gowns, sterile gloves, sterile  drape, hand hygiene and skin antiseptic. A timeout was performed prior to the initiation of the procedure. The right lower quadrant was interrogated with ultrasound. A complex fluid and gas collection is successfully and easily identified. A suitable skin entry site was selected and marked. Local anesthesia was attained by infiltration with 1% lidocaine. A small dermatotomy was made. Under real-time sonographic guidance, an 18 gauge trocar needle was carefully advanced into the fluid collection. A 0.035 Amplatz wire was then coiled in the fluid collection. The needle was removed. The skin tract was dilated to 55 [place] and a Cook 12 [place] all-purpose drainage catheter was advanced over the wire into the complex fluid collection. Aspiration yields bloody purulent fluid. This appears to be an infected hematoma. Aspiration and lavage were performed until approximately 90 mL of bloody purulent fluid was evacuated. The drain was then flushed a final time and connected to JP bulb suction before being secured to the skin with 0 Prolene suture. The patient tolerated the procedure well. IMPRESSION: Successful placement of a 12 French drainage catheter into the right lower quadrant fluid collection. The aspirate is bloody, purulent fluid most suggestive of an infected hematoma. A sample of the aspirate was sent for culture. Signed, Criselda Peaches,  MD [facility] Electronically Signed   By: Jacqulynn Cadet M.D.   On: 01/09/2018 12:28    Labs:  CBC: Recent Labs    01/11/18 0429 01/11/18 1531 01/12/18 0422 01/13/18 0406  WBC 9.7 9.7 9.6 8.7  HGB 9.3* 9.5* 9.5* 9.0*  HCT 29.6* 30.5* 30.8* 29.4*  PLT 1,095* 1,131* 1,128* 991*    COAGS: Recent Labs    11/28/17 1220 12/24/17 1301 01/08/18 1452  INR 1.08 1.25 1.19    BMP: Recent Labs    01/10/18 0401 01/11/18 0429 01/12/18 0422 01/13/18 0406  NA 142 142 144 145  K 3.8 3.9 3.9 3.9    CL 106 105 107 109  CO2 25 26 25 25   GLUCOSE 139* 184* 146* 165*  BUN 27* 27* 25* 26*  CALCIUM 8.8* 8.8* 9.1 8.9  CREATININE 0.72 0.74 0.77 0.78  GFRNONAA >60 >60 >60 >60  GFRAA >60 >60 >60 >60    LIVER FUNCTION TESTS: Recent Labs    12/31/17 0628 01/03/18 0433 01/07/18 0414 01/10/18 0401  BILITOT 1.5* 1.4* 1.1 0.6  AST 43* 22 37 27  ALT 30 18 34 32  ALKPHOS 221* 216* 234* 182*  PROT 4.4* 4.6* 5.5* 5.7*  ALBUMIN 1.3* 1.3* 1.5* 1.7*    Assessment and Plan: RLQ abscess drain Draining well Feeling better IR following Will need Re CT when OP less than 10 cc/day Flush 5-10 cc sterile saline daily if home with drain  Electronically Signed: Ascencion Dike, PA-C 01/13/2018, 8:59 AM   I spent a total of 15 Minutes at the the patient's bedside AND on the patient's hospital floor or unit, greater than 50% of which was counseling/coordinating care for RLQ abscess drain

## 2018-01-13 NOTE — Progress Notes (Signed)
17 Days Post-Op   Subjective/Chief Complaint: No complaints Nursing reports that patient is having some bloody discharge into diaper + non-bloody BM yesterday Tolerating full liquids No complaints of pain   Objective: Vital signs in last 24 hours: Temp:  [98.4 F (36.9 C)-98.6 F (37 C)] 98.5 F (36.9 C) (01/27 0455) Pulse Rate:  [96-101] 96 (01/27 0455) Resp:  [16-18] 16 (01/26 2221) BP: (124-137)/(60-73) 124/60 (01/27 0455) SpO2:  [98 %-99 %] 99 % (01/27 0455) Last BM Date: 01/13/18  Intake/Output from previous day: 01/26 0701 - 01/27 0700 In: 1330 [P.O.:5; I.V.:1175; IV Piggyback:150] Out: 10 [Drains:10] Intake/Output this shift: No intake/output data recorded.  General appearance: alert, cooperative and no distress Resp: clear to auscultation bilaterally Cardio: regular rate and rhythm, S1, S2 normal, no murmur, click, rub or gallop GI: soft, non-tender; RLQ drain with bloody drainage around insertion site Pelvic: some bloody discharge appears to be coming from vagina; anus appears normal Midline incision - thin strip of granulation tissue, clean; minimal drainage  Lab Results:  Recent Labs    01/12/18 0422 01/13/18 0406  WBC 9.6 8.7  HGB 9.5* 9.0*  HCT 30.8* 29.4*  PLT 1,128* 991*   BMET Recent Labs    01/12/18 0422 01/13/18 0406  NA 144 145  K 3.9 3.9  CL 107 109  CO2 25 25  GLUCOSE 146* 165*  BUN 25* 26*  CREATININE 0.77 0.78  CALCIUM 9.1 8.9   PT/INR No results for input(s): LABPROT, INR in the last 72 hours. ABG No results for input(s): PHART, HCO3 in the last 72 hours.  Invalid input(s): PCO2, PO2  Studies/Results: No results found.  Anti-infectives: Anti-infectives (From admission, onward)   Start     Dose/Rate Route Frequency Ordered Stop   12/31/17 1300  piperacillin-tazobactam (ZOSYN) IVPB 3.375 g     3.375 g 12.5 mL/hr over 240 Minutes Intravenous Every 8 hours 12/31/17 1252        Assessment/Plan: s/p  Procedure(s): DIAGNOSTIC LAPAROSCOPY/ LAPAROTOMY WITH SMALL BOWEL RESECTION TIMES TWO (N/A)  Drain for fluid collection.  Await microbiology DVT-therapeutic lovenox - decrease dose and monitor Hgb Continue TPN - will advance diet but continue TPN until taking better PO's Mobilize with tx as tolerated.   Pt did not want to advance diet given nausea.   Severe protein calorie malnutrition    LOS: 20 days    Theresa Howe 01/13/2018

## 2018-01-14 LAB — CBC
HCT: 29.6 % — ABNORMAL LOW (ref 36.0–46.0)
Hemoglobin: 8.9 g/dL — ABNORMAL LOW (ref 12.0–15.0)
MCH: 26 pg (ref 26.0–34.0)
MCHC: 30.1 g/dL (ref 30.0–36.0)
MCV: 86.5 fL (ref 78.0–100.0)
Platelets: 915 10*3/uL (ref 150–400)
RBC: 3.42 MIL/uL — ABNORMAL LOW (ref 3.87–5.11)
RDW: 18 % — AB (ref 11.5–15.5)
WBC: 9.1 10*3/uL (ref 4.0–10.5)

## 2018-01-14 LAB — COMPREHENSIVE METABOLIC PANEL
ALK PHOS: 186 U/L — AB (ref 38–126)
ALT: 38 U/L (ref 14–54)
ANION GAP: 13 (ref 5–15)
AST: 31 U/L (ref 15–41)
Albumin: 1.9 g/dL — ABNORMAL LOW (ref 3.5–5.0)
BUN: 25 mg/dL — ABNORMAL HIGH (ref 6–20)
CALCIUM: 8.9 mg/dL (ref 8.9–10.3)
CHLORIDE: 110 mmol/L (ref 101–111)
CO2: 25 mmol/L (ref 22–32)
CREATININE: 0.81 mg/dL (ref 0.44–1.00)
Glucose, Bld: 185 mg/dL — ABNORMAL HIGH (ref 65–99)
Potassium: 3.7 mmol/L (ref 3.5–5.1)
Sodium: 148 mmol/L — ABNORMAL HIGH (ref 135–145)
Total Bilirubin: 0.5 mg/dL (ref 0.3–1.2)
Total Protein: 6.1 g/dL — ABNORMAL LOW (ref 6.5–8.1)

## 2018-01-14 LAB — PREALBUMIN: PREALBUMIN: 27.3 mg/dL (ref 18–38)

## 2018-01-14 LAB — URINALYSIS, COMPLETE (UACMP) WITH MICROSCOPIC: Squamous Epithelial / LPF: NONE SEEN

## 2018-01-14 LAB — DIFFERENTIAL
BASOS ABS: 0.1 10*3/uL (ref 0.0–0.1)
BASOS PCT: 1 %
EOS ABS: 0.3 10*3/uL (ref 0.0–0.7)
Eosinophils Relative: 3 %
Lymphocytes Relative: 17 %
Lymphs Abs: 1.6 10*3/uL (ref 0.7–4.0)
Monocytes Absolute: 0.8 10*3/uL (ref 0.1–1.0)
Monocytes Relative: 9 %
NEUTROS PCT: 70 %
Neutro Abs: 6.3 10*3/uL (ref 1.7–7.7)

## 2018-01-14 LAB — PHOSPHORUS: PHOSPHORUS: 3.6 mg/dL (ref 2.5–4.6)

## 2018-01-14 LAB — AEROBIC/ANAEROBIC CULTURE (SURGICAL/DEEP WOUND)

## 2018-01-14 LAB — AEROBIC/ANAEROBIC CULTURE W GRAM STAIN (SURGICAL/DEEP WOUND)

## 2018-01-14 LAB — GLUCOSE, CAPILLARY
GLUCOSE-CAPILLARY: 126 mg/dL — AB (ref 65–99)
GLUCOSE-CAPILLARY: 151 mg/dL — AB (ref 65–99)
GLUCOSE-CAPILLARY: 166 mg/dL — AB (ref 65–99)
GLUCOSE-CAPILLARY: 171 mg/dL — AB (ref 65–99)
GLUCOSE-CAPILLARY: 178 mg/dL — AB (ref 65–99)
Glucose-Capillary: 165 mg/dL — ABNORMAL HIGH (ref 65–99)
Glucose-Capillary: 161 mg/dL — ABNORMAL HIGH (ref 65–99)

## 2018-01-14 LAB — MAGNESIUM: MAGNESIUM: 1.9 mg/dL (ref 1.7–2.4)

## 2018-01-14 LAB — TRIGLYCERIDES: TRIGLYCERIDES: 172 mg/dL — AB (ref <150)

## 2018-01-14 MED ORDER — BOOST / RESOURCE BREEZE PO LIQD CUSTOM
1.0000 | Freq: Three times a day (TID) | ORAL | Status: DC
  Administered 2018-01-14 – 2018-01-21 (×5): 1 via ORAL

## 2018-01-14 MED ORDER — TRAVASOL 10 % IV SOLN
INTRAVENOUS | Status: AC
  Administered 2018-01-14: 18:00:00 via INTRAVENOUS
  Filled 2018-01-14: qty 696

## 2018-01-14 NOTE — Progress Notes (Signed)
18 Days Post-Op   Subjective/Chief Complaint: Pt has had more bloody urine.  No n/v.  Eating better.     Objective: Vital signs in last 24 hours: Temp:  [97.7 F (36.5 C)-98.6 F (37 C)] 98.6 F (37 C) (01/28 0639) Pulse Rate:  [94-105] 102 (01/28 0639) Resp:  [16] 16 (01/28 0639) BP: (125-139)/(62-89) 136/89 (01/28 0639) SpO2:  [99 %-100 %] 100 % (01/28 0639) Weight:  [54 kg (119 lb 0.8 oz)] 54 kg (119 lb 0.8 oz) (01/28 0537) Last BM Date: 01/13/18  Intake/Output from previous day: 01/27 0701 - 01/28 0700 In: 620 [I.V.:570; IV Piggyback:50] Out: 30 [Drains:30] Intake/Output this shift: No intake/output data recorded.  General appearance: alert, cooperative and no distress Resp: breathing comfortably GI: soft, non-tender; RLQ drain with bloody drainage around insertion site.  Drain output serous Pelvic: some bloody discharge appears to be coming from vagina; anus appears normal Midline incision - thin strip of granulation tissue, clean; minimal drainage  Lab Results:  Recent Labs    01/13/18 1756 01/14/18 0425  WBC 8.0 9.1  HGB 8.9* 8.9*  HCT 28.9* 29.6*  PLT 885* 915*   BMET Recent Labs    01/13/18 0406 01/14/18 0425  NA 145 148*  K 3.9 3.7  CL 109 110  CO2 25 25  GLUCOSE 165* 185*  BUN 26* 25*  CREATININE 0.78 0.81  CALCIUM 8.9 8.9   PT/INR No results for input(s): LABPROT, INR in the last 72 hours. ABG No results for input(s): PHART, HCO3 in the last 72 hours.  Invalid input(s): PCO2, PO2  Studies/Results: No results found.  Anti-infectives: Anti-infectives (From admission, onward)   Start     Dose/Rate Route Frequency Ordered Stop   12/31/17 1300  piperacillin-tazobactam (ZOSYN) IVPB 3.375 g     3.375 g 12.5 mL/hr over 240 Minutes Intravenous Every 8 hours 12/31/17 1252        Assessment/Plan: s/p Procedure(s): DIAGNOSTIC LAPAROSCOPY/ LAPAROTOMY WITH SMALL BOWEL RESECTION TIMES TWO (N/A)  Drain for fluid collection.  Await  microbiology DVT-therapeutic lovenox - decrease dose and monitor Hgb Continue TPN - calorie counts.  Prealbumin came up.   Mobilize with tx as tolerated.    Severe protein calorie malnutrition improving.    LOS: 21 days    Theresa Howe 01/14/2018

## 2018-01-14 NOTE — Progress Notes (Addendum)
Calorie Count Note  48 hour calorie count ordered.  Diet: Soft  Supplements: Ensure Enlive po BID, each supplement provides 350 kcal and 20 grams of protein  Pt remains on TPN; TPN rate decreased down to 50 ml/hr (goal rate 65 ml/r) on 01/13/18, to help stimulate intake. Regimen providing 70g AA, 180g CHO, and 29g ILE, which equals to 1178 kCals per day, meeting >80% of patient needs.   Case discussed with RN. Pt advanced to soft diet yesterday morning. Pt with poor oral intake, consumed 0% of breakfast. She is also refusing Ensure supplements. Calorie count initiated this AM.   1/28 Breakfast: 0% Lunch: 0% Dinner: n/a Supplements: Refused this AM's Ensure supplement  Nutrition Dx: Severe Malnutrition related to chronic illness(stage IV neuroendocrine tumor) as evidenced by energy intake < 75% for > or equal to 1 month, severe fat depletion, severe muscle depletion; ongoing  Goal: Patient will meet greater than or equal to 90% of their needs; progressing  Intervention:   -TPN management per pharmacy -D/c Ensure Enlive po BID, each supplement provides 350 kcal and 20 grams of , due to poor acceptance -Boost Breeze po TID, each supplement provides 250 kcal and 9 grams of protein -Follow-up on 01/15/18 for calorie count results  Jazion Atteberry A. Jimmye Norman, RD, LDN, CDE Pager: [phone] After hours Pager: [phone]

## 2018-01-14 NOTE — Progress Notes (Signed)
In and out urinary catheterization done at bedside. Bloody drainage noted, bright red blood. Specimen sent to lab. Bedside RN notified of results.

## 2018-01-14 NOTE — Progress Notes (Signed)
Patient ID: Theresa Howe, female   DOB: Apr 23, 1931, 84 y.o.   MRN: [id number]    Referring Physician(s): Dr. Almond Lint  Supervising Physician: Jolaine Click  Patient Status: [facility] - In-pt  Chief Complaint: Fluid collection  Subjective: Patient is confused.  Allergies: Patient has no known allergies.  Medications: Prior to Admission medications   Medication Sig Start Date End Date Taking? Authorizing Provider  acetaminophen (TYLENOL) 325 MG tablet Take 2 tablets (650 mg total) by mouth every 6 (six) hours as needed for mild pain (or Fever >/= 101). 12/13/17  Yes Sherrie George, PA-C  albuterol [facility] HFA) 108 (90 Base) MCG/ACT inhaler Inhale 2 puffs into the lungs every 6 (six) hours as needed for wheezing or shortness of breath. 12/23/15  Yes [place], Velna Hatchet, MD  aspirin 81 MG tablet Take 81 mg by mouth daily.     Yes [provider]  Calcium Carbonate-Vitamin D (CALCIUM 600 + D PO) Take 1 tablet by mouth daily.    Yes [provider]  collagenase (SANTYL) ointment Apply 1 application topically daily. Left buttock   Yes [provider]  docusate sodium (COLACE) 100 MG capsule Take 1 capsule (100 mg total) by mouth 2 (two) times daily. 12/13/17  Yes Sherrie George, PA-C  dorzolamide (TRUSOPT) 2 % ophthalmic solution Place 1 drop into both eyes 2 (two) times daily. 06/02/16  Yes [place], Velna Hatchet, MD  escitalopram (LEXAPRO) 10 MG tablet TAKE 1 TABLET AT BEDTIME Patient taking differently: TAKE 5mg  AT BEDTIME 12/03/17  Yes [place], Velna Hatchet, MD  feeding supplement, ENSURE ENLIVE, (ENSURE ENLIVE) LIQD Take 237 mLs by mouth 3 (three) times daily between meals. 12/13/17  Yes Sherrie George, PA-C  fluticasone [facility]) 50 MCG/ACT nasal spray USE 2 SPRAYS IN EACH       NOSTRIL DAILY 12/24/17  Yes [place], Velna Hatchet, MD  gabapentin (NEURONTIN) 100 MG capsule TAKE 1 TO 2 CAPSULES AT    BEDTIME FOR NEUROPATHY Patient taking differently: Take 100 mg by mouth  at bedtime.  12/03/17  Yes [place], Velna Hatchet, MD  latanoprost (XALATAN) 0.005 % ophthalmic solution Place 1 drop into both eyes at bedtime. 10/01/17  Yes [place], Velna Hatchet, MD  losartan-hydrochlorothiazide (HYZAAR) 50-12.5 MG tablet Take 1 tablet daily by mouth. 10/25/17  Yes [place], Velna Hatchet, MD  metFORMIN (GLUCOPHAGE) 1000 MG tablet Take 0.5 tablets (500 mg total) by mouth 2 (two) times daily with a meal. 12/26/16  Yes [place], Velna Hatchet, MD  mirtazapine (REMERON) 15 MG tablet Take 0.5 tablets (7.5 mg total) by mouth at bedtime. For appetite 10/04/17  Yes [place], Velna Hatchet, MD  Multiple Vitamins-Minerals (CENTRUM SILVER PO) Take 1 tablet by mouth daily.    Yes [provider]  omeprazole (PRILOSEC) 40 MG capsule Take 40 mg by mouth daily.   Yes [provider]  ondansetron (ZOFRAN) 4 MG tablet Take 4 mg by mouth every 6 (six) hours as needed for nausea or vomiting.   Yes [provider]  oxyCODONE (ROXICODONE) 5 MG/5ML solution Take 5 mLs (5 mg total) by mouth every 6 (six) hours. Patient taking differently: Take 5 mg by mouth every 6 (six) hours as needed for severe pain.  12/13/17  Yes Sherrie George, PA-C  polyethylene glycol [facility] / GLYCOLAX) packet Take 17 g by mouth daily as needed for moderate constipation. 12/13/17  Yes Sherrie George, PA-C  alendronate (FOSAMAX) 70 MG tablet TAKE 1 TABLET EVERY 7 DAYS WITH A FULL GLASS OF WATER ON  AN EMPTY STOMACH 12/25/17   [name], MD  ONE TOUCH ULTRA TEST test strip TEST FASTING BLOOD SUGAR ONCE DAILY **DX E11.4 09/10/17   [name], MD    Vital Signs: BP 136/89 (BP Location: Left Arm)   Pulse (!) 102   Temp 98.6 F (37 C) (Oral)   Resp 16   Ht 5\' 5"  (1.651 m) Comment: 12/04/17  Wt 119 lb 0.8 oz (54 kg)   SpO2 100%   BMI 19.81 kg/m   Physical Exam: Abd: soft, drain with some bloody output, but not significant.  30cc documented yesterday.  Drain site is c/d/i  Imaging: No results  found.  Labs:  CBC: Recent Labs    01/12/18 0422 01/13/18 0406 01/13/18 1756 01/14/18 0425  WBC 9.6 8.7 8.0 9.1  HGB 9.5* 9.0* 8.9* 8.9*  HCT 30.8* 29.4* 28.9* 29.6*  PLT 1,128* 991* 885* 915*    COAGS: Recent Labs    11/28/17 1220 12/24/17 1301 01/08/18 1452  INR 1.08 1.25 1.19    BMP: Recent Labs    01/11/18 0429 01/12/18 0422 01/13/18 0406 01/14/18 0425  NA 142 144 145 148*  K 3.9 3.9 3.9 3.7  CL 105 107 109 110  CO2 26 25 25 25   GLUCOSE 184* 146* 165* 185*  BUN 27* 25* 26* 25*  CALCIUM 8.8* 9.1 8.9 8.9  CREATININE 0.74 0.77 0.78 0.81  GFRNONAA >60 >60 >60 >60  GFRAA >60 >60 >60 >60    LIVER FUNCTION TESTS: Recent Labs    01/03/18 0433 01/07/18 0414 01/10/18 0401 01/14/18 0425  BILITOT 1.4* 1.1 0.6 0.5  AST 22 37 27 31  ALT 18 34 32 38  ALKPHOS 216* 234* 182* 186*  PROT 4.6* 5.5* 5.7* 6.1*  ALBUMIN 1.3* 1.5* 1.7* 1.9*    Assessment and Plan: 1. Intra-abdominal fluid collection, s/p perc drain CX show some bacteria, but reincubated Drain is stable Cont drain care and flushing as ordered. Will cont to follow  Electronically Signed: Letha Cape 01/14/2018, 1:24 PM   I spent a total of 15 Minutes at the the patient's bedside AND on the patient's hospital floor or unit, greater than 50% of which was counseling/coordinating care for intra-abdominal fluid collection

## 2018-01-14 NOTE — Progress Notes (Signed)
PHARMACY - ADULT TOTAL PARENTERAL NUTRITION CONSULT NOTE   Pharmacy Consult: TPN Indication: Intolerance to enteral feeding  Patient Measurements: Height: '5\' 5"'  (165.1 cm)(12/04/17) Weight: 119 lb 0.8 oz (54 kg) IBW/kg (Calculated) : 57 TPN AdjBW (KG): 57.6 Body mass index is 19.81 kg/m.  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  Patient was re-admitted from SNF after she began having nausea and vomiting and unable to tolerate enteral feeding.  Found to have a large dilated segment of bowel with bezoar and necrosis, now s/p SBR x 2.  Pharmacy managing TPN for nutritional support.  Patient states she eats up to 10% of her meals.  She feels full but promised she will try to eat more.  GI: prealbumin 5.3 >> 27.3, LBM 1/27, RLQ drain O/P 72m - IV PPI. PRN Zofran/Phenergan - pt did not want to advance diet w/ nausea. Possible choledocholithiasis on CT, repeat CT once O/P < 10 ml/day Endo: DM on metformin PTA. CBGs acceptable 120-185. Relying mostly on SSI to cover for hyperglycemia instead of increasing insulin in TPN and exposing pt to risk of hypoglycemia.  Insulin requirements in the past 24 hours: 15 units SSI + 25 units in TPN Lytes: all WNL except Na up to 148, CoCa slightly high at 10.5 Renal: SCr 0.81 stable, BUN 25 - no UOP charted, net +13.3L since admit, LR [facility] stable on RA Cards: VSS - asa AC: Lovenox 676mSQ BID for acute nonocclusive DVT on 1/21 CT - hgb low but stable, plts down to 915. Vaginal bleeding 1/27 and continuing, so Lovenox reduced to 4070mQ Q12H by MD. Hepatobil: alk phos elevated, AST/ALT/tbili WNL. TG 133 >> 172 Neuro: confused - Lexapro, Remeron, s/p Haldol for sundowning (QTc 447m64mD: Zosyn (1/14 >> ) for IAI. 1/21 CT showed abscess (cx pending) - afebrile, WBC down to 11.2 TPN Access: PICC placed 12/24/17>> pulled out 1/17; replaced 01/04/18 TPN start date: 12/25/17  Nutritional Goals (per  RD rec on 1/21): 1500-1700 kCal and 75-90 gram protein per day  Current Nutrition: TPN  Soft diet (minimal intake, </= 10% of meals per RN) Ensure BID (received 1 yesterday = 350kCal and 20gm protein)   Plan:  Continue TPN at reduced rate 50 ml/hr (goal rate 65 ml/hr) - will not wean further for now with no change in PO intake TPN will provide 70g AA, 180g CHO, and 29g ILE, which equals to 1178 kCals per day, meeting >80% of patient needs. PO intake/supplements to provide the rest of patient's needs. Electrolytes in TPN: Cl:Ac 1:2, Mag providing 22 mEq/d, reduce calcium 1/26, reduce Na slightly 1/28 Daily multivitamin and trace elements in TPN Continue moderate SSI Q4H + continue reduced insulin in TPN to 25 units (decreased TPN rate 1/27) F/U PO intake/diet advancement to wean TPN further F/U AM labs, resolution of bleeding   HaleElicia LamparmD, BCPS Clinical Pharmacist Clinical phone for 01/14/2018 until 3:30pm: [phone] 3:30pm, please call main pharmacy at: [phone] 01/14/2018 9:05 AM

## 2018-01-14 NOTE — Care Management Important Message (Signed)
Important Message  Patient Details  Name: Theresa Howe MRN: [id number] Date of Birth: 25-Mar-1931   Medicare Important Message Given:  Yes    Orbie Pyo 01/14/2018, 12:34 PM

## 2018-01-14 NOTE — Progress Notes (Signed)
Occupational Therapy Treatment Patient Details Name: Theresa Howe MRN: [id number] DOB: December 17, 1931 Today's Date: 01/14/2018    History of present illness 84 yo with Stage IV neuroendocrine cancer of the small bowel, s/p resection 12/18 admitted with partial SBO s/p exp lap 1/10. PMHx: DM, glaucoma, HTN, neuropathy   OT comments  Pt demonstrating some progress toward OT goals this session. She was able to complete functional mobility to bathroom and stand at sink for grooming tasks with min assist for balance and safety. Pt requiring step-by-step cues to initiate and continue with ADL tasks as well as for proper use of tools. She would benefit from continued OT services to maximize functional independence prior to D/C. Continue to recommend SNF level rehabilitation post-acute D/C.    Follow Up Recommendations  SNF    Equipment Recommendations  None recommended by OT    Recommendations for Other Services      Precautions / Restrictions Precautions Precautions: Fall Precaution Comments: Abdominial incision, sacral wound, and recently has been leaking copious amounts of blood from her vagina?  Use her pullups that are in the room.  Required Braces or Orthoses: Other Brace/Splint Other Brace/Splint: JP drain Restrictions Weight Bearing Restrictions: No       Mobility Bed Mobility Overal bed mobility: Needs Assistance Bed Mobility: Rolling;Sidelying to Sit;Sit to Supine Rolling: Min guard Sidelying to sit: Min guard   Sit to supine: Min guard   General bed mobility comments: Min guard assist with cues to progress with task.   Transfers Overall transfer level: Needs assistance Equipment used: Rolling walker (2 wheeled) Transfers: Sit to/from Stand Sit to Stand: Min assist Stand pivot transfers: Min assist       General transfer comment: Min assist to power up and steady    Balance Overall balance assessment: Needs assistance Sitting-balance support: Feet supported;No  upper extremity supported Sitting balance-Leahy Scale: Good     Standing balance support: Single extremity supported Standing balance-Leahy Scale: Poor Standing balance comment: reliant on UE support during standing                           ADL either performed or assessed with clinical judgement   ADL Overall ADL's : Needs assistance/impaired     Grooming: Oral care;Minimal assistance;Standing                   Toilet Transfer: Minimal assistance;Ambulation Toilet Transfer Details (indicate cue type and reason): Simulated with sit<>stand followed by mobility in room.          Functional mobility during ADLs: Minimal assistance;Min guard General ADL Comments: Consistent cues to follow comands this session. She does report dizziness after standing at sink for a few minutes and requests to have a seat. Difficulty sequencing tasks.      Vision       Perception     Praxis      Cognition Arousal/Alertness: Awake/alert Behavior During Therapy: WFL for tasks assessed/performed Overall Cognitive Status: Impaired/Different from baseline Area of Impairment: Orientation;Attention;Memory;Following commands;Safety/judgement;Awareness;Problem solving                 Orientation Level: Disoriented to;Time;Place;Situation Current Attention Level: Sustained Memory: Decreased short-term memory Following Commands: Follows one step commands inconsistently Safety/Judgement: Decreased awareness of safety;Decreased awareness of deficits Awareness: Intellectual Problem Solving: Decreased initiation General Comments: Pt requiring cues to initiate tasks and step by step cues to continue with tasks. She demonstrates decreased ideational praxis skills as well.  Exercises     Shoulder Instructions       General Comments      Pertinent Vitals/ Pain       Pain Assessment: No/denies pain Faces Pain Scale: No hurt Pain Location: Abdomen Pain Descriptors  / Indicators: Aching Pain Intervention(s): Limited activity within patient's tolerance;Monitored during session;Repositioned  Home Living                                          Prior Functioning/Environment              Frequency  Min 2X/week        Progress Toward Goals  OT Goals(current goals can now be found in the care plan section)  Progress towards OT goals: Progressing toward goals  Acute Rehab OT Goals Patient Stated Goal: none stated this session  OT Goal Formulation: Patient unable to participate in goal setting Time For Goal Achievement: 01/15/18 Potential to Achieve Goals: Good  Plan Discharge plan remains appropriate    Co-evaluation                 AM-PAC PT "6 Clicks" Daily Activity     Outcome Measure   Help from another person eating meals?: A Lot Help from another person taking care of personal grooming?: A Little Help from another person toileting, which includes using toliet, bedpan, or urinal?: A Lot Help from another person bathing (including washing, rinsing, drying)?: A Lot Help from another person to put on and taking off regular upper body clothing?: None Help from another person to put on and taking off regular lower body clothing?: A Lot 6 Click Score: 15    End of Session Equipment Utilized During Treatment: Rolling walker;Gait belt  OT Visit Diagnosis: Unsteadiness on feet (R26.81);Cognitive communication deficit (R41.841)   Activity Tolerance Patient tolerated treatment well   Patient Left with call bell/phone within reach;in chair;with chair alarm set   Nurse Communication Mobility status        Time: 1191-4782 OT Time Calculation (min): 30 min  Charges: OT Treatments $Self Care/Home Management : 23-37 mins  Doristine Section, MS OTR/L  Pager: [phone]    Lula Michaux A Embry Manrique 01/14/2018, 6:07 PM

## 2018-01-14 NOTE — Progress Notes (Signed)
Physical Therapy Treatment Patient Details Name: Theresa Howe MRN: [id number] DOB: 1931-05-27 Today's Date: 01/14/2018    History of Present Illness 84 yo with Stage IV neuroendocrine cancer of the small bowel, s/p resection 12/18 admitted with partial SBO s/p exp lap 1/10. PMHx: DM, glaucoma, HTN, neuropathy    PT Comments    Pt did better with hand held assist today than with RW.  She is not familiar with it's use and it becomes more of a barrier/obstacle than a helpful aid.  She also had a copious amount of blood with clots come out of her genital area (rectum vs vaginia-hard to tell which) when we stood up.  RN made aware (she had several episode of this this weekend).  PT will continue to follow acutely.  Goals updated and SNF remains appropriate.   Follow Up Recommendations  SNF;Supervision/Assistance - 24 hour     Equipment Recommendations  3in1 (PT)    Recommendations for Other Services   NA     Precautions / Restrictions Precautions Precautions: Fall Precaution Comments: Abdominial incision, sacral wound, and recently has been leaking copious amounts of blood from her vagina?  Use her pullups that are in the room.  Required Braces or Orthoses: Other Brace/Splint Other Brace/Splint: JP drain    Mobility  Bed Mobility Overal bed mobility: Needs Assistance Bed Mobility: Sidelying to Sit;Sit to Sidelying   Sidelying to sit: Min assist     Sit to sidelying: Mod assist General bed mobility comments: Min assist to support trunk to get to sitting EOB.  Mod assist to lift both legs to position back into bed from sitting to supine.   Transfers Overall transfer level: Needs assistance Equipment used: Rolling walker (2 wheeled);1 person hand held assist Transfers: Sit to/from Stand Sit to Stand: Min assist         General transfer comment: Min assist to support trunk to get to standing from bed, toilet, and hallway bench  Ambulation/Gait Ambulation/Gait assistance:  Min assist Ambulation Distance (Feet): 150 Feet Assistive device: 1 person hand held assist;Rolling walker (2 wheeled) Gait Pattern/deviations: Step-through pattern;Shuffle;Staggering left;Staggering right     General Gait Details: Pt actually did better without RW as this is a new device and she is unfamiliar with how to use it.  She did much better with min hand held assist.        Balance Overall balance assessment: Needs assistance Sitting-balance support: Feet supported;No upper extremity supported Sitting balance-Leahy Scale: Good     Standing balance support: Single extremity supported Standing balance-Leahy Scale: Poor                              Cognition Arousal/Alertness: Awake/alert Behavior During Therapy: WFL for tasks assessed/performed Overall Cognitive Status: Impaired/Different from baseline Area of Impairment: Orientation;Attention;Memory;Following commands;Safety/judgement;Awareness;Problem solving                 Orientation Level: Disoriented to;Time;Place;Situation Current Attention Level: Sustained Memory: Decreased short-term memory Following Commands: Follows one step commands inconsistently Safety/Judgement: Decreased awareness of safety;Decreased awareness of deficits Awareness: Intellectual Problem Solving: Decreased initiation General Comments: Pt pulling at lines and drain, needs tactile cues to initiate motion to EOB and to stand.          General Comments General comments (skin integrity, edema, etc.): Spent quite a bit of time in the bathroom getting her cleaned up after she stood to go to the bathroom and a copious  amount of blood and clots came from her undercarriage (hard to tell where from-rectum, vaginal).  RN made aware and RN tech in room assisting with clean up of pt and floor.        Pertinent Vitals/Pain Pain Assessment: No/denies pain Faces Pain Scale: No hurt       Prior Function            PT Goals  (current goals can now be found in the care plan section) Acute Rehab PT Goals Patient Stated Goal: none stated this session  PT Goal Formulation: With patient Time For Goal Achievement: 01/28/18 Potential to Achieve Goals: Good Progress towards PT goals: Progressing toward goals    Frequency    Min 2X/week      PT Plan Current plan remains appropriate       AM-PAC PT "6 Clicks" Daily Activity  Outcome Measure  Difficulty turning over in bed (including adjusting bedclothes, sheets and blankets)?: A Little Difficulty moving from lying on back to sitting on the side of the bed? : Unable Difficulty sitting down on and standing up from a chair with arms (e.g., wheelchair, bedside commode, etc,.)?: Unable Help needed moving to and from a bed to chair (including a wheelchair)?: A Little Help needed walking in hospital room?: A Little Help needed climbing 3-5 steps with a railing? : A Lot 6 Click Score: 13    End of Session   Activity Tolerance: Patient limited by fatigue Patient left: in bed;with call bell/phone within reach;with bed alarm set;with family/visitor present Nurse Communication: Mobility status PT Visit Diagnosis: Other abnormalities of gait and mobility (R26.89);Muscle weakness (generalized) (M62.81)     Time: 4098-1191 PT Time Calculation (min) (ACUTE ONLY): 44 min  Charges:  $Gait Training: 8-22 mins $Therapeutic Activity: 23-37 mins          Santez Woodcox B. Aanchal Cope, PT, DPT [phone]            01/14/2018, 12:08 PM

## 2018-01-15 LAB — GLUCOSE, CAPILLARY
GLUCOSE-CAPILLARY: 165 mg/dL — AB (ref 65–99)
GLUCOSE-CAPILLARY: 252 mg/dL — AB (ref 65–99)
Glucose-Capillary: 152 mg/dL — ABNORMAL HIGH (ref 65–99)
Glucose-Capillary: 159 mg/dL — ABNORMAL HIGH (ref 65–99)
Glucose-Capillary: 163 mg/dL — ABNORMAL HIGH (ref 65–99)
Glucose-Capillary: 177 mg/dL — ABNORMAL HIGH (ref 65–99)

## 2018-01-15 LAB — BASIC METABOLIC PANEL
ANION GAP: 12 (ref 5–15)
ANION GAP: 13 (ref 5–15)
BUN: 30 mg/dL — AB (ref 6–20)
BUN: 33 mg/dL — ABNORMAL HIGH (ref 6–20)
CALCIUM: 8.6 mg/dL — AB (ref 8.9–10.3)
CALCIUM: 8.9 mg/dL (ref 8.9–10.3)
CO2: 23 mmol/L (ref 22–32)
CO2: 23 mmol/L (ref 22–32)
Chloride: 111 mmol/L (ref 101–111)
Chloride: 113 mmol/L — ABNORMAL HIGH (ref 101–111)
Creatinine, Ser: 1.06 mg/dL — ABNORMAL HIGH (ref 0.44–1.00)
Creatinine, Ser: 1.13 mg/dL — ABNORMAL HIGH (ref 0.44–1.00)
GFR calc Af Amer: 54 mL/min — ABNORMAL LOW (ref >60)
GFR calc non Af Amer: 47 mL/min — ABNORMAL LOW (ref >60)
GFR, EST AFRICAN AMERICAN: 50 mL/min — AB (ref >60)
GFR, EST NON AFRICAN AMERICAN: 43 mL/min — AB (ref >60)
GLUCOSE: 497 mg/dL — AB (ref 65–99)
Glucose, Bld: 207 mg/dL — ABNORMAL HIGH (ref 65–99)
POTASSIUM: 5.9 mmol/L — AB (ref 3.5–5.1)
POTASSIUM: 3.9 mmol/L (ref 3.5–5.1)
Sodium: 146 mmol/L — ABNORMAL HIGH (ref 135–145)
Sodium: 149 mmol/L — ABNORMAL HIGH (ref 135–145)

## 2018-01-15 LAB — CBC
HEMATOCRIT: 28.8 % — AB (ref 36.0–46.0)
Hemoglobin: 8.6 g/dL — ABNORMAL LOW (ref 12.0–15.0)
MCH: 26.8 pg (ref 26.0–34.0)
MCHC: 29.9 g/dL — ABNORMAL LOW (ref 30.0–36.0)
MCV: 89.7 fL (ref 78.0–100.0)
Platelets: 915 10*3/uL (ref 150–400)
RBC: 3.21 MIL/uL — ABNORMAL LOW (ref 3.87–5.11)
RDW: 18.8 % — AB (ref 11.5–15.5)
WBC: 13.7 10*3/uL — AB (ref 4.0–10.5)

## 2018-01-15 MED ORDER — TRAVASOL 10 % IV SOLN
INTRAVENOUS | Status: AC
  Administered 2018-01-15: 18:00:00 via INTRAVENOUS
  Filled 2018-01-15: qty 696

## 2018-01-15 NOTE — Progress Notes (Signed)
19 Days Post-Op   Subjective/Chief Complaint: Nothing recorded on calorie count yet.  Pt kept removing the purewick, so a urine specimen was unable to be obtained yesterday.   Objective: Vital signs in last 24 hours: Temp:  [97.9 F (36.6 C)-98.6 F (37 C)] 98.6 F (37 C) (01/29 0440) Pulse Rate:  [102-113] 108 (01/29 0440) Resp:  [16-18] 16 (01/29 0440) BP: (129-174)/(67-80) 174/80 (01/29 0440) SpO2:  [100 %] 100 % (01/29 0440) Last BM Date: 01/14/18  Intake/Output from previous day: 01/28 0701 - 01/29 0700 In: 869.2 [I.V.:669.2; IV Piggyback:200] Out: 150 [Urine:150] Intake/Output this shift: No intake/output data recorded.  General appearance: sleeping, but arouses easily, cooperative and no distress Resp: breathing comfortably GI: soft, non-tender; RLQ drain with bloody drainage around insertion site.  Drain output serous Midline incision - thin strip of granulation tissue, clean; minimal drainage  Lab Results:  Recent Labs    01/14/18 0425 01/15/18 0415  WBC 9.1 13.7*  HGB 8.9* 8.6*  HCT 29.6* 28.8*  PLT 915* 915*   BMET Recent Labs    01/15/18 0415 01/15/18 0855  NA 146* 149*  K 5.9* 3.9  CL 111 113*  CO2 23 23  GLUCOSE 497* 207*  BUN 30* 33*  CREATININE 1.06* 1.13*  CALCIUM 8.6* 8.9   PT/INR No results for input(s): LABPROT, INR in the last 72 hours. ABG No results for input(s): PHART, HCO3 in the last 72 hours.  Invalid input(s): PCO2, PO2  Studies/Results: No results found.  Anti-infectives: Anti-infectives (From admission, onward)   Start     Dose/Rate Route Frequency Ordered Stop   12/31/17 1300  piperacillin-tazobactam (ZOSYN) IVPB 3.375 g     3.375 g 12.5 mL/hr over 240 Minutes Intravenous Every 8 hours 12/31/17 1252        Assessment/Plan: s/p Procedure(s): DIAGNOSTIC LAPAROSCOPY/ LAPAROTOMY WITH SMALL BOWEL RESECTION TIMES TWO (N/A)  Drain for fluid collection. multiple organisms, none predominant.  Continue antibiotics for  another few days, then d/c.  Will discuss plan for repeat scan and possible drain removal with IR. DVT once HCT more stable, will change back to therapeutic dosing. Continue TPN - calorie counts.  Prealbumin came up.    Mobilize with tx as tolerated.    Severe protein calorie malnutrition improving.    LOS: 22 days    Stark Klein 01/15/2018

## 2018-01-15 NOTE — Progress Notes (Signed)
PHARMACY - ADULT TOTAL PARENTERAL NUTRITION CONSULT NOTE   Pharmacy Consult: TPN Indication: Intolerance to enteral feeding  Patient Measurements: Height: _0  (165.1 cm)(12/04/17) Weight: 119 lb 0.8 oz (54 kg) IBW/kg (Calculated) : 57 TPN AdjBW (KG): 57.6 Body mass index is 19.81 kg/m.  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  Patient was re-admitted from SNF after she began having nausea and vomiting and unable to tolerate enteral feeding.  Found to have a large dilated segment of bowel with bezoar and necrosis, now s/p SBR x 2.  Pharmacy managing TPN for nutritional support.  Patient states she eats up to 10% of her meals.  She feels full but promised she will try to eat more.  GI: prealbumin 5.3 >> 27.3, LBM 1/28, RLQ drain O/P 9m - IV PPI. PRN Zofran/Phenergan - pt did not want to advance diet w/ nausea. Possible choledocholithiasis on CT, repeat CT once O/P < 10 ml/day Endo: DM on metformin PTA. CBGs acceptable 126-207 (497 is lab error). Relying mostly on SSI to cover for hyperglycemia instead of increasing insulin in TPN and exposing pt to risk of hypoglycemia.  Insulin requirements in the past 24 hours: 17 units SSI + 25 units in TPN Lytes: all WNL except Na up to 149, Cl up to 113, CoCa slightly high at 10.5 Renal: SCr up to 1.13, BUN 33 - 150cc UOP charted, net +12.2L since admit, [place] on RA Cards: BP wnl-elevated, HR 80-100s - asa AC: Lovenox 696mSQ BID for acute nonocclusive DVT on 1/21 CT - hgb low but stable, plts down to 915. Vaginal bleeding 1/27 and continuing, so Lovenox reduced to 4052mQ Q12H by MD. Hepatobil: alk phos elevated, AST/ALT/tbili WNL. TG 133 >> 172 Neuro: confused - Lexapro, Remeron, s/p Haldol for sundowning (QTc [id number] prn ID: Zosyn (1/14 >> ) for IAI. 1/21 CT showed abscess (cx pending) - afebrile, WBC up to 13.7 TPN Access: PICC placed 12/24/17>> pulled out  1/17; replaced 01/04/18 TPN start date: 12/25/17  Nutritional Goals (per RD rec on 1/21): 1500-1700 kCal and 75-90 gram protein per day  Current Nutrition: TPN  Soft diet (minimal intake, </= 10% of meals per RN) Boost Breeze TID (2 given yesterday - provides 250 kcal + 9 grams protein for each supplement)   Plan:  Continue TPN at reduced rate 50 ml/hr (goal rate 65 ml/hr) - will not wean further for now with no change in PO intake TPN will provide 70g AA, 180g CHO, and 29g ILE, which equals to 1178 kCals per day, meeting >80% of patient needs. PO intake/supplements to provide the rest of patient's needs. Electrolytes in TPN: change to max acetate, Mag providing 22 mEq/d, reduce calcium and Na again 1/29 Daily multivitamin and trace elements in TPN Continue moderate SSI Q4H + continue reduced insulin in TPN to 25 units (decreased TPN rate 1/27) F/U PO intake/diet advancement to wean TPN further F/U AM labs, resolution of bleeding   HaleElicia LamparmD, BCPS Clinical Pharmacist Clinical phone for 01/15/2018 until 3:30pm: [phone] 3:30pm, please call main pharmacy at: [phone] 01/15/2018 8:35 AM

## 2018-01-15 NOTE — Progress Notes (Signed)
Calorie Count Note  48 hour calorie count ordered.  Diet: Soft Supplements: Boost Breeze po TID, each supplement provides 250 kcal and 9 grams of protein  Case discussed with RN; pt is eating practically nothing. Per RN, pt was given a few bites of breakfast, but threw it up. Intake was extremely minimal yesterday as well; refused both meals and supplements.   Per pharmacy notes, plan to continueTPN at reduced rate 50 ml/hr (goal rate 65 ml/hr); will not wean further for now with no change in PO intake. TPN will provide 70g AA, 180g CHO, and 29g ILE, which equals to 1178 kCals per day, meeting >80% of patient needs.   1/28 Breakfast: 0% Lunch: 0% Dinner: 0% Supplements: Refused this AM's Ensure supplement; 2 Boost Breeze supplements given (500 kcals, 18 grams protein)  1/29 Breakfast: 0% Lunch: 0% Dinner: n/a Supplements: Refused Boost Breeze x 2  Average Total intake: 250 kcal (17% of minimum estimated needs)  9 protein (12% of minimum estimated needs)   Nutrition Dx: Severe Malnutritionrelated to chronic illness(stage IV neuroendocrine tumor)as evidenced by energy intake < 75% for > or equal to 1 month, severe fat depletion, severe muscle depletion; ongoing  Goal: Patient will meet greater than or equal to 90% of their needs; progressing  Intervention:   -TPN management per pharmacy -Continue Boost Breeze po TID, each supplement provides 250 kcal and 9 grams of protein -D/c calorie count, secondary to minimal intake  Hanalei Glace A. Jimmye Norman, RD, LDN, CDE Pager: [phone] After hours Pager: [phone]

## 2018-01-15 NOTE — Progress Notes (Addendum)
Pt bleeding from JP insertion site. Surgery PA went to see pt and assess. Will continue to monitor

## 2018-01-16 ENCOUNTER — Telehealth: Payer: Self-pay | Admitting: Oncology

## 2018-01-16 ENCOUNTER — Encounter (HOSPITAL_COMMUNITY): Payer: Self-pay | Admitting: *Deleted

## 2018-01-16 ENCOUNTER — Inpatient Hospital Stay (HOSPITAL_COMMUNITY): Payer: Medicare Other

## 2018-01-16 LAB — COMPREHENSIVE METABOLIC PANEL
ALK PHOS: 162 U/L — AB (ref 38–126)
ALT: 40 U/L (ref 14–54)
AST: 33 U/L (ref 15–41)
Albumin: 2 g/dL — ABNORMAL LOW (ref 3.5–5.0)
Anion gap: 13 (ref 5–15)
BILIRUBIN TOTAL: 0.6 mg/dL (ref 0.3–1.2)
BUN: 34 mg/dL — AB (ref 6–20)
CHLORIDE: 112 mmol/L — AB (ref 101–111)
CO2: 25 mmol/L (ref 22–32)
CREATININE: 1.37 mg/dL — AB (ref 0.44–1.00)
Calcium: 8.8 mg/dL — ABNORMAL LOW (ref 8.9–10.3)
GFR calc Af Amer: 40 mL/min — ABNORMAL LOW (ref >60)
GFR, EST NON AFRICAN AMERICAN: 34 mL/min — AB (ref >60)
Glucose, Bld: 98 mg/dL (ref 65–99)
Potassium: 3.6 mmol/L (ref 3.5–5.1)
Sodium: 150 mmol/L — ABNORMAL HIGH (ref 135–145)
Total Protein: 6.3 g/dL — ABNORMAL LOW (ref 6.5–8.1)

## 2018-01-16 LAB — GLUCOSE, CAPILLARY
GLUCOSE-CAPILLARY: 133 mg/dL — AB (ref 65–99)
Glucose-Capillary: 120 mg/dL — ABNORMAL HIGH (ref 65–99)
Glucose-Capillary: 141 mg/dL — ABNORMAL HIGH (ref 65–99)
Glucose-Capillary: 144 mg/dL — ABNORMAL HIGH (ref 65–99)
Glucose-Capillary: 150 mg/dL — ABNORMAL HIGH (ref 65–99)
Glucose-Capillary: 82 mg/dL (ref 65–99)

## 2018-01-16 LAB — PHOSPHORUS: Phosphorus: 3.5 mg/dL (ref 2.5–4.6)

## 2018-01-16 LAB — MAGNESIUM: MAGNESIUM: 2.3 mg/dL (ref 1.7–2.4)

## 2018-01-16 MED ORDER — TRAVASOL 10 % IV SOLN
INTRAVENOUS | Status: AC
  Filled 2018-01-16: qty 696

## 2018-01-16 MED ORDER — ENOXAPARIN SODIUM 60 MG/0.6ML ~~LOC~~ SOLN
1.0000 mg/kg | SUBCUTANEOUS | Status: DC
  Administered 2018-01-16 – 2018-01-21 (×6): 55 mg via SUBCUTANEOUS
  Filled 2018-01-16 (×7): qty 0.6

## 2018-01-16 MED ORDER — DEXTROSE 5 % IV SOLN
INTRAVENOUS | Status: DC
  Administered 2018-01-16 – 2018-01-17 (×3): via INTRAVENOUS
  Administered 2018-01-17: 1 mL via INTRAVENOUS

## 2018-01-16 NOTE — Progress Notes (Signed)
Patient pulled out PICC line. Safety mittens pulled off also by patient. Will notify MD. IV nurse started peripheral for now until further notice by MD.

## 2018-01-16 NOTE — Progress Notes (Signed)
Pt continues to have occasional blood with clots noted on bed pad.

## 2018-01-16 NOTE — Progress Notes (Signed)
20 Days Post-Op   Subjective/Chief Complaint: Throwing up what she eats again.  Drain output remains low.  Afebrile.    Objective: Vital signs in last 24 hours: Temp:  [98.3 F (36.8 C)-98.7 F (37.1 C)] 98.3 F (36.8 C) (01/30 0453) Pulse Rate:  [106-113] 107 (01/30 0453) Resp:  [17] 17 (01/30 0453) BP: (122-147)/(50-76) 122/50 (01/30 0453) SpO2:  [100 %] 100 % (01/30 0453) Last BM Date: 01/15/18  Intake/Output from previous day: 01/29 0701 - 01/30 0700 In: 0  Out: 30 [Drains:30] Intake/Output this shift: Total I/O In: -  Out: 11 [Urine:1; Drains:10]  General appearance: remains a little confused, but much more alert and interactive.   Resp: breathing comfortably GI: soft, non-tender; RLQ drain with bloody drainage around insertion site.  Drain output serous.  Bloody urine.   Midline incision - thin strip of granulation tissue, clean; minimal drainage  Lab Results:  Recent Labs    01/14/18 0425 01/15/18 0415  WBC 9.1 13.7*  HGB 8.9* 8.6*  HCT 29.6* 28.8*  PLT 915* 915*   BMET Recent Labs    01/15/18 0855 01/16/18 0444  NA 149* 150*  K 3.9 3.6  CL 113* 112*  CO2 23 25  GLUCOSE 207* 98  BUN 33* 34*  CREATININE 1.13* 1.37*  CALCIUM 8.9 8.8*   PT/INR No results for input(s): LABPROT, INR in the last 72 hours. ABG No results for input(s): PHART, HCO3 in the last 72 hours.  Invalid input(s): PCO2, PO2  Studies/Results: No results found.  Anti-infectives: Anti-infectives (From admission, onward)   Start     Dose/Rate Route Frequency Ordered Stop   12/31/17 1300  piperacillin-tazobactam (ZOSYN) IVPB 3.375 g     3.375 g 12.5 mL/hr over 240 Minutes Intravenous Every 8 hours 12/31/17 1252        Assessment/Plan: s/p Procedure(s): DIAGNOSTIC LAPAROSCOPY/ LAPAROTOMY WITH SMALL BOWEL RESECTION TIMES TWO (N/A)   Drain for fluid collection. multiple organisms, none predominant.  Continue antibiotics for another few days, then d/c.   Repeat CT  today. DVT on prophylactic dose lovenox, once HCT more stable, will change back to therapeutic dosing. Continue TPN - calorie counts.  Prealbumin came up.    Mobilize with tx as tolerated.    Severe protein calorie malnutrition improving.    LOS: 23 days    Stark Klein 01/16/2018

## 2018-01-16 NOTE — Progress Notes (Signed)
Referring Physician(s): Dr. Barry Dienes  Supervising Physician: Marybelle Killings  Patient Status:  [facility] - In-pt  Chief Complaint: Intra-abdominal fluid collection  Subjective: Patient confused.  Bloody bowel movements.  Pulled several PICC lines even with mittens on.   Allergies: Patient has no known allergies.  Medications: Prior to Admission medications   Medication Sig Start Date End Date Taking? Authorizing Provider  acetaminophen (TYLENOL) 325 MG tablet Take 2 tablets (650 mg total) by mouth every 6 (six) hours as needed for mild pain (or Fever >/= 101). 12/13/17  Yes Earnstine Regal, PA-C  albuterol [facility] HFA) 108 (90 Base) MCG/ACT inhaler Inhale 2 puffs into the lungs every 6 (six) hours as needed for wheezing or shortness of breath. 12/23/15  Yes [place], Modena Nunnery, MD  aspirin 81 MG tablet Take 81 mg by mouth daily.     Yes [provider]  Calcium Carbonate-Vitamin D (CALCIUM 600 + D PO) Take 1 tablet by mouth daily.    Yes [provider]  collagenase (SANTYL) ointment Apply 1 application topically daily. Left buttock   Yes [provider]  docusate sodium (COLACE) 100 MG capsule Take 1 capsule (100 mg total) by mouth 2 (two) times daily. 12/13/17  Yes Earnstine Regal, PA-C  dorzolamide (TRUSOPT) 2 % ophthalmic solution Place 1 drop into both eyes 2 (two) times daily. 06/02/16  Yes [place], Modena Nunnery, MD  escitalopram (LEXAPRO) 10 MG tablet TAKE 1 TABLET AT BEDTIME Patient taking differently: TAKE 5mg  AT BEDTIME 12/03/17  Yes [place], Modena Nunnery, MD  feeding supplement, ENSURE ENLIVE, (ENSURE ENLIVE) LIQD Take 237 mLs by mouth 3 (three) times daily between meals. 12/13/17  Yes Earnstine Regal, PA-C  fluticasone [facility]) 50 MCG/ACT nasal spray USE 2 SPRAYS IN EACH       NOSTRIL DAILY 12/24/17  Yes [place], Modena Nunnery, MD  gabapentin (NEURONTIN) 100 MG capsule TAKE 1 TO 2 CAPSULES AT    BEDTIME FOR NEUROPATHY Patient taking differently: Take 100 mg by  mouth at bedtime.  12/03/17  Yes [place], Modena Nunnery, MD  latanoprost (XALATAN) 0.005 % ophthalmic solution Place 1 drop into both eyes at bedtime. 10/01/17  Yes [place], Modena Nunnery, MD  losartan-hydrochlorothiazide (HYZAAR) 50-12.5 MG tablet Take 1 tablet daily by mouth. 10/25/17  Yes [place], Modena Nunnery, MD  metFORMIN (GLUCOPHAGE) 1000 MG tablet Take 0.5 tablets (500 mg total) by mouth 2 (two) times daily with a meal. 12/26/16  Yes [place], Modena Nunnery, MD  mirtazapine (REMERON) 15 MG tablet Take 0.5 tablets (7.5 mg total) by mouth at bedtime. For appetite 10/04/17  Yes [place], Modena Nunnery, MD  Multiple Vitamins-Minerals (CENTRUM SILVER PO) Take 1 tablet by mouth daily.    Yes [provider]  omeprazole (PRILOSEC) 40 MG capsule Take 40 mg by mouth daily.   Yes [provider]  ondansetron (ZOFRAN) 4 MG tablet Take 4 mg by mouth every 6 (six) hours as needed for nausea or vomiting.   Yes [provider]  oxyCODONE (ROXICODONE) 5 MG/5ML solution Take 5 mLs (5 mg total) by mouth every 6 (six) hours. Patient taking differently: Take 5 mg by mouth every 6 (six) hours as needed for severe pain.  12/13/17  Yes Earnstine Regal, PA-C  polyethylene glycol [facility] / GLYCOLAX) packet Take 17 g by mouth daily as needed for moderate constipation. 12/13/17  Yes Earnstine Regal, PA-C  alendronate (FOSAMAX) 70 MG tablet TAKE 1 TABLET EVERY 7 DAYS WITH A FULL GLASS OF WATER ON AN EMPTY STOMACH  12/25/17   Alycia Rossetti, MD  ONE TOUCH ULTRA TEST test strip TEST FASTING BLOOD SUGAR ONCE DAILY **DX E11.4 09/10/17   Alycia Rossetti, MD     Vital Signs: BP 130/68 (BP Location: Left Arm)   Pulse (!) 105   Temp 98.4 F (36.9 C) (Oral)   Resp 14   Ht 5\' 5"  (1.651 m) Comment: 12/04/17  Wt 119 lb 0.8 oz (54 kg)   SpO2 100%   BMI 19.81 kg/m   Physical Exam  Constitutional: She appears well-developed.  Cardiovascular: Regular rhythm and normal heart sounds.  tachycardia  Pulmonary/Chest:  Effort normal and breath sounds normal. No respiratory distress.  Abdominal: Soft. There is no tenderness.  Drain remains in place.  Now with bloody output. Since ranging from 10-30 mL daily.   Neurological: She is alert.  Skin: Skin is warm and dry.  Nursing note and vitals reviewed.   Imaging: Ct Abdomen Pelvis Wo Contrast  Result Date: 01/16/2018 CLINICAL DATA:  Lower abdominal pain. Patient would not drink contrast secondary to confusion. EXAM: CT ABDOMEN AND PELVIS WITHOUT CONTRAST TECHNIQUE: Multidetector CT imaging of the abdomen and pelvis was performed following the standard protocol without IV contrast. COMPARISON:  CT 01/07/2018 FINDINGS: Lower chest: Lung bases are clear.  No pleural or pericardial fluid. Hepatobiliary: Normal Pancreas: Normal Spleen: Normal Adrenals/Urinary Tract: Adrenal glands are normal. Kidneys are normal except for a hyperdense cyst in the right midportion measuring 15 mm in diameter, inapparent on the previous contrast-enhanced study. Stomach/Bowel: No evidence of bowel obstruction. Drainage catheter in the right pelvis with good drainage of the previously seen large right-sided abscess. Small amount of residual material in communication with the catheter. Previous surgery at the right ileocecal junction region. 2 cm well-circumscribed hyperdense focus adjacent to the ileocecal valve not present previously. The differential diagnosis for this includes postprocedure hematoma versus rapid recurrence of tumor versus small leak collection with hyperdensity secondary to previously administered contrast. Vascular/Lymphatic: Mild aortic atherosclerosis. Reproductive: Previous hysterectomy.  Pessary in place. Other: No free fluid or air. Musculoskeletal: Chronic degenerative changes of the lumbar spine. IMPRESSION: Further continued decrease in right pelvic abscess. This appears completely drained presently, with only a small amount of density surrounding the drainage catheter.  New demonstration of 2 small hyperdense areas in the right lower quadrant adjacent to the anastomosis/drainage catheter, the larger measuring 2 cm in diameter. The differential diagnosis is postprocedural hematoma versus rapid recurrence of tumor versus is small leak collections. These warrant additional follow-up. Electronically Signed   By: Nelson Chimes M.D.   On: 01/16/2018 14:43    Labs:  CBC: Recent Labs    01/13/18 0406 01/13/18 1756 01/14/18 0425 01/15/18 0415  WBC 8.7 8.0 9.1 13.7*  HGB 9.0* 8.9* 8.9* 8.6*  HCT 29.4* 28.9* 29.6* 28.8*  PLT 991* 885* 915* 915*    COAGS: Recent Labs    11/28/17 1220 12/24/17 1301 01/08/18 1452  INR 1.08 1.25 1.19    BMP: Recent Labs    01/14/18 0425 01/15/18 0415 01/15/18 0855 01/16/18 0444  NA 148* 146* 149* 150*  K 3.7 5.9* 3.9 3.6  CL 110 111 113* 112*  CO2 25 23 23 25   GLUCOSE 185* 497* 207* 98  BUN 25* 30* 33* 34*  CALCIUM 8.9 8.6* 8.9 8.8*  CREATININE 0.81 1.06* 1.13* 1.37*  GFRNONAA >60 47* 43* 34*  GFRAA >60 54* 50* 40*    LIVER FUNCTION TESTS: Recent Labs    01/07/18 0414 01/10/18 0401  01/14/18 0425 01/16/18 0444  BILITOT 1.1 0.6 0.5 0.6  AST 37 27 31 33  ALT 34 32 38 40  ALKPHOS 234* 182* 186* 162*  PROT 5.5* 5.7* 6.1* 6.3*  ALBUMIN 1.5* 1.7* 1.9* 2.0*    Assessment and Plan: Intra-abdominal fluid collection Drain remains intact.  Output now bloody-- stopped lovenox. Flushes easily per RN.  CT Abd obtained today-- will review results with IR MD once available.   Poor venous access Patient has been getting TPN for nutrition support due to nausea/vomiting.  She has had several PICCs placed, however has pulled them.  New request for tunneled PICC. Spoke with nephew for consent.  Informed him of plans for line placement as well as discussed the possibility that patient could still pull this line in her confused state although it would be more secure than direct stick.  Due to confusion and  restlessness, will plan to proceed with sedation. Risks and benefits discussed with the patient including, but not limited to bleeding, infection, vascular injury. All of the patient's questions were answered, patient is agreeable to proceed. Consent signed and in chart.  Electronically Signed: Docia Barrier, PA 01/16/2018, 4:32 PM   I spent a total of 15 Minutes at the the patient's bedside AND on the patient's hospital floor or unit, greater than 50% of which was counseling/coordinating care for poor venous access, intra-abdominal fluid collection.

## 2018-01-16 NOTE — Social Work (Addendum)
CSW continuing to follow to support discharge to SNF when medically appropriate.   CSW aware that pt is still receiving TPN, diet slowly advancing, and will require dressing changes on sacrum and midline.   CSW continuing to follow.   Alexander Mt, [place] Work [phone]

## 2018-01-16 NOTE — Telephone Encounter (Signed)
R/s appt per 1/30  sch message - sent reminder letter in the mail with appt date and time.

## 2018-01-16 NOTE — Progress Notes (Addendum)
PHARMACY - ADULT TOTAL PARENTERAL NUTRITION CONSULT NOTE   Pharmacy Consult: TPN Indication: Intolerance to enteral feeding  Patient Measurements: Height: 5' 5" (165.1 cm)(12/04/17) Weight: 119 lb 0.8 oz (54 kg) IBW/kg (Calculated) : 57 TPN AdjBW (KG): 57.6 Body mass index is 19.81 kg/m.  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  Patient was re-admitted from SNF after she began having nausea and vomiting and unable to tolerate enteral feeding.  Found to have a large dilated segment of bowel with bezoar and necrosis, now s/p SBR x 2.  Pharmacy managing TPN for nutritional support.  GI: prealbumin 5.3 >> 27.3, having BMs, RLQ drain O/P 92m - PPI. PRN Zofran/Phenergan - continued N/V. Possible choledocholithiasis on CT, repeat CT 1/30 Endo: DM on metformin PTA. CBGs acceptable 126-207 (497 is lab error). Relying mostly on SSI to cover for hyperglycemia instead of increasing insulin in TPN and exposing pt to risk of hypoglycemia.  Insulin requirements in the past 24 hours: 21 units SSI + 25 units in TPN Lytes: all WNL except Na up to 150, Cl down to 112, CoCa 10.3 Renal: SCr up to 1.37, BUN 34 - no UOP charted, net +12.3L since admit, D5W at 50 ml/hr, LR [place] on RA Cards: BP variable, HR 80-100s - asa AC: Lovenox acute nonocclusive DVT - hgb low but stable, plts down to 915. Vaginal bleeding 1/27 and continuing, so Lovenox reduced to 453mSQ Q12H by MD, now to 5522mQ q24h with CrCl<30. Hepatobil: alk phos elevated, AST/ALT/tbili WNL. TG 133 >> 172 Neuro: confused - Lexapro, Remeron, s/p Haldol for sundowning (QTc 447m75mmorphine prn ID: Zosyn (1/14 >> ) for IAI. 1/21 CT showed abscess (cx with mult organisms, none predominant) - afebrile, WBC up to 13.7 TPN Access: PICC placed 12/24/17>> pulled out 1/17; replaced 01/04/18 TPN start date: 12/25/17  Nutritional Goals (per RD rec on 1/21): 1500-1700 kCal and 75-90 gram  protein per day  Current Nutrition: TPN  Soft diet (minimal intake, </= 10% of meals per RN) Boost Breeze TID (provides 250 kcal + 9 grams protein for each supplement - refusing all doses 1/29)   Plan:  Continue TPN at reduced rate 50 ml/hr (goal rate 65 ml/hr) - will not wean further for now with no change in PO intake TPN will provide 70g AA, 180g CHO, and 29g ILE, which equals to 1178 kCals per day, meeting >80% of patient needs. PO intake/supplements hopefully to provide the rest of patient's needs. Electrolytes in TPN: continue max acetate, Mag providing 22 mEq/d, reduce calcium 1/29, reduce Na again 1/30 Daily multivitamin and trace elements in TPN Continue moderate SSI Q4H + continue reduced insulin in TPN to 25 units (decreased TPN rate 1/27) Continue D5W at 50 ml/hr + LR at [facility] MD F/U PO intake/diet advancement to wean TPN further F/U AM labs, resolution of bleeding   HaleElicia LamparmD, BCPS Clinical Pharmacist Clinical phone for 01/16/2018 until 3:30pm: [phone] 3:30pm, please call main pharmacy at: [phone] 01/16/2018 9:19 AM

## 2018-01-17 ENCOUNTER — Inpatient Hospital Stay (HOSPITAL_COMMUNITY): Payer: Medicare Other

## 2018-01-17 ENCOUNTER — Ambulatory Visit: Payer: Medicare Other | Admitting: Oncology

## 2018-01-17 ENCOUNTER — Encounter (HOSPITAL_COMMUNITY): Payer: Self-pay | Admitting: Interventional Radiology

## 2018-01-17 ENCOUNTER — Other Ambulatory Visit: Payer: Self-pay | Admitting: Oncology

## 2018-01-17 DIAGNOSIS — C7A8 Other malignant neuroendocrine tumors: Secondary | ICD-10-CM

## 2018-01-17 HISTORY — PX: IR US GUIDE VASC ACCESS RIGHT: IMG2390

## 2018-01-17 HISTORY — PX: IR FLUORO GUIDE CV LINE RIGHT: IMG2283

## 2018-01-17 LAB — CBC
HEMATOCRIT: 28.8 % — AB (ref 36.0–46.0)
HEMOGLOBIN: 8.4 g/dL — AB (ref 12.0–15.0)
MCH: 26.1 pg (ref 26.0–34.0)
MCHC: 29.2 g/dL — ABNORMAL LOW (ref 30.0–36.0)
MCV: 89.4 fL (ref 78.0–100.0)
Platelets: 746 10*3/uL — ABNORMAL HIGH (ref 150–400)
RBC: 3.22 MIL/uL — AB (ref 3.87–5.11)
RDW: 19.2 % — ABNORMAL HIGH (ref 11.5–15.5)
WBC: 8.1 10*3/uL (ref 4.0–10.5)

## 2018-01-17 LAB — GLUCOSE, CAPILLARY
Glucose-Capillary: 135 mg/dL — ABNORMAL HIGH (ref 65–99)
Glucose-Capillary: 139 mg/dL — ABNORMAL HIGH (ref 65–99)
Glucose-Capillary: 169 mg/dL — ABNORMAL HIGH (ref 65–99)
Glucose-Capillary: 169 mg/dL — ABNORMAL HIGH (ref 65–99)
Glucose-Capillary: 204 mg/dL — ABNORMAL HIGH (ref 65–99)
Glucose-Capillary: 59 mg/dL — ABNORMAL LOW (ref 65–99)
Glucose-Capillary: 99 mg/dL (ref 65–99)

## 2018-01-17 LAB — MAGNESIUM: Magnesium: 1.9 mg/dL (ref 1.7–2.4)

## 2018-01-17 LAB — COMPREHENSIVE METABOLIC PANEL
ALBUMIN: 1.9 g/dL — AB (ref 3.5–5.0)
ALT: 31 U/L (ref 14–54)
AST: 25 U/L (ref 15–41)
Alkaline Phosphatase: 134 U/L — ABNORMAL HIGH (ref 38–126)
Anion gap: 13 (ref 5–15)
BILIRUBIN TOTAL: 0.7 mg/dL (ref 0.3–1.2)
BUN: 28 mg/dL — ABNORMAL HIGH (ref 6–20)
CHLORIDE: 115 mmol/L — AB (ref 101–111)
CO2: 22 mmol/L (ref 22–32)
Calcium: 8.5 mg/dL — ABNORMAL LOW (ref 8.9–10.3)
Creatinine, Ser: 1.2 mg/dL — ABNORMAL HIGH (ref 0.44–1.00)
GFR calc Af Amer: 47 mL/min — ABNORMAL LOW (ref >60)
GFR calc non Af Amer: 40 mL/min — ABNORMAL LOW (ref >60)
GLUCOSE: 127 mg/dL — AB (ref 65–99)
POTASSIUM: 3 mmol/L — AB (ref 3.5–5.1)
SODIUM: 150 mmol/L — AB (ref 135–145)
TOTAL PROTEIN: 6 g/dL — AB (ref 6.5–8.1)

## 2018-01-17 LAB — PROTIME-INR
INR: 1.25
Prothrombin Time: 15.6 seconds — ABNORMAL HIGH (ref 11.4–15.2)

## 2018-01-17 LAB — PHOSPHORUS: Phosphorus: 3.3 mg/dL (ref 2.5–4.6)

## 2018-01-17 MED ORDER — FENTANYL CITRATE (PF) 100 MCG/2ML IJ SOLN
INTRAMUSCULAR | Status: AC
  Filled 2018-01-17: qty 2

## 2018-01-17 MED ORDER — SODIUM CHLORIDE 0.9% FLUSH
10.0000 mL | INTRAVENOUS | Status: DC | PRN
  Administered 2018-01-24: 10 mL
  Filled 2018-01-17: qty 40

## 2018-01-17 MED ORDER — LIDOCAINE HCL (PF) 1 % IJ SOLN
INTRAMUSCULAR | Status: AC | PRN
  Administered 2018-01-17: 5 mL

## 2018-01-17 MED ORDER — POTASSIUM CHLORIDE 10 MEQ/100ML IV SOLN: 10.0000 meq | INTRAVENOUS | Status: DC

## 2018-01-17 MED ORDER — LIDOCAINE HCL 1 % IJ SOLN
INTRAMUSCULAR | Status: AC
  Filled 2018-01-17: qty 20

## 2018-01-17 MED ORDER — MIDAZOLAM HCL 2 MG/2ML IJ SOLN
INTRAMUSCULAR | Status: AC | PRN
  Administered 2018-01-17: 1 mg via INTRAVENOUS

## 2018-01-17 MED ORDER — POTASSIUM CHLORIDE 10 MEQ/50ML IV SOLN
10.0000 meq | INTRAVENOUS | Status: AC
  Administered 2018-01-17 (×3): 10 meq via INTRAVENOUS
  Filled 2018-01-17 (×3): qty 50

## 2018-01-17 MED ORDER — TRAVASOL 10 % IV SOLN
INTRAVENOUS | Status: AC
  Administered 2018-01-17: 17:00:00 via INTRAVENOUS
  Filled 2018-01-17: qty 904.8

## 2018-01-17 MED ORDER — FENTANYL CITRATE (PF) 100 MCG/2ML IJ SOLN
INTRAMUSCULAR | Status: AC | PRN
  Administered 2018-01-17: 50 ug via INTRAVENOUS

## 2018-01-17 MED ORDER — MIDAZOLAM HCL 2 MG/2ML IJ SOLN
INTRAMUSCULAR | Status: AC
  Filled 2018-01-17: qty 2

## 2018-01-17 NOTE — Progress Notes (Signed)
Referring Physician(s): Dr. Barry Dienes  Supervising Physician: Marybelle Killings  Patient Status:  [facility] - In-pt  Chief Complaint: Intra-abdominal fluid collection  Subjective: Patient confused.  Had tunneled IJ placed this AM.  Allergies: Patient has no known allergies.  Medications: Prior to Admission medications   Medication Sig Start Date End Date Taking? Authorizing Provider  acetaminophen (TYLENOL) 325 MG tablet Take 2 tablets (650 mg total) by mouth every 6 (six) hours as needed for mild pain (or Fever >/= 101). 12/13/17  Yes Earnstine Regal, PA-C  albuterol [facility] HFA) 108 (90 Base) MCG/ACT inhaler Inhale 2 puffs into the lungs every 6 (six) hours as needed for wheezing or shortness of breath. 12/23/15  Yes [place], Modena Nunnery, MD  aspirin 81 MG tablet Take 81 mg by mouth daily.     Yes [provider]  Calcium Carbonate-Vitamin D (CALCIUM 600 + D PO) Take 1 tablet by mouth daily.    Yes [provider]  collagenase (SANTYL) ointment Apply 1 application topically daily. Left buttock   Yes [provider]  docusate sodium (COLACE) 100 MG capsule Take 1 capsule (100 mg total) by mouth 2 (two) times daily. 12/13/17  Yes Earnstine Regal, PA-C  dorzolamide (TRUSOPT) 2 % ophthalmic solution Place 1 drop into both eyes 2 (two) times daily. 06/02/16  Yes [place], Modena Nunnery, MD  escitalopram (LEXAPRO) 10 MG tablet TAKE 1 TABLET AT BEDTIME Patient taking differently: TAKE 5mg  AT BEDTIME 12/03/17  Yes [place], Modena Nunnery, MD  feeding supplement, ENSURE ENLIVE, (ENSURE ENLIVE) LIQD Take 237 mLs by mouth 3 (three) times daily between meals. 12/13/17  Yes Earnstine Regal, PA-C  fluticasone [facility]) 50 MCG/ACT nasal spray USE 2 SPRAYS IN EACH       NOSTRIL DAILY 12/24/17  Yes [place], Modena Nunnery, MD  gabapentin (NEURONTIN) 100 MG capsule TAKE 1 TO 2 CAPSULES AT    BEDTIME FOR NEUROPATHY Patient taking differently: Take 100 mg by mouth at bedtime.  12/03/17  Yes [place],  Modena Nunnery, MD  latanoprost (XALATAN) 0.005 % ophthalmic solution Place 1 drop into both eyes at bedtime. 10/01/17  Yes [place], Modena Nunnery, MD  losartan-hydrochlorothiazide (HYZAAR) 50-12.5 MG tablet Take 1 tablet daily by mouth. 10/25/17  Yes [place], Modena Nunnery, MD  metFORMIN (GLUCOPHAGE) 1000 MG tablet Take 0.5 tablets (500 mg total) by mouth 2 (two) times daily with a meal. 12/26/16  Yes [place], Modena Nunnery, MD  mirtazapine (REMERON) 15 MG tablet Take 0.5 tablets (7.5 mg total) by mouth at bedtime. For appetite 10/04/17  Yes , Modena Nunnery, MD  Multiple Vitamins-Minerals (CENTRUM SILVER PO) Take 1 tablet by mouth daily.    Yes [provider]  omeprazole (PRILOSEC) 40 MG capsule Take 40 mg by mouth daily.   Yes [provider]  ondansetron (ZOFRAN) 4 MG tablet Take 4 mg by mouth every 6 (six) hours as needed for nausea or vomiting.   Yes [provider]  oxyCODONE (ROXICODONE) 5 MG/5ML solution Take 5 mLs (5 mg total) by mouth every 6 (six) hours. Patient taking differently: Take 5 mg by mouth every 6 (six) hours as needed for severe pain.  12/13/17  Yes Earnstine Regal, PA-C  polyethylene glycol [facility] / GLYCOLAX) packet Take 17 g by mouth daily as needed for moderate constipation. 12/13/17  Yes Earnstine Regal, PA-C  alendronate (FOSAMAX) 70 MG tablet TAKE 1 TABLET EVERY 7 DAYS WITH A FULL GLASS OF WATER ON AN EMPTY STOMACH 12/25/17   Alycia Rossetti, MD  ONE TOUCH ULTRA TEST test strip TEST FASTING BLOOD SUGAR ONCE DAILY **DX E11.4 09/10/17   Alycia Rossetti, MD     Vital Signs: BP 131/64   Pulse 98   Temp (!) 97.4 F (36.3 C) (Oral)   Resp 15   Ht 5\' 5"  (1.651 m) Comment: 12/04/17  Wt 119 lb 0.8 oz (54 kg)   SpO2 100%   BMI 19.81 kg/m   Physical Exam  Constitutional: She appears well-developed.  Cardiovascular: Regular rhythm and normal heart sounds.  tachycardia  Pulmonary/Chest: Effort normal and breath sounds normal. No respiratory distress.    Abdominal: Soft. There is no tenderness.  Drain remains in place.  Now with bloody output. Since ranging from 10-30 mL daily.   Neurological: She is alert.  Skin: Skin is warm and dry.  Nursing note and vitals reviewed.   Imaging: Ct Abdomen Pelvis Wo Contrast  Result Date: 01/16/2018 CLINICAL DATA:  Lower abdominal pain. Patient would not drink contrast secondary to confusion. EXAM: CT ABDOMEN AND PELVIS WITHOUT CONTRAST TECHNIQUE: Multidetector CT imaging of the abdomen and pelvis was performed following the standard protocol without IV contrast. COMPARISON:  CT 01/07/2018 FINDINGS: Lower chest: Lung bases are clear.  No pleural or pericardial fluid. Hepatobiliary: Normal Pancreas: Normal Spleen: Normal Adrenals/Urinary Tract: Adrenal glands are normal. Kidneys are normal except for a hyperdense cyst in the right midportion measuring 15 mm in diameter, inapparent on the previous contrast-enhanced study. Stomach/Bowel: No evidence of bowel obstruction. Drainage catheter in the right pelvis with good drainage of the previously seen large right-sided abscess. Small amount of residual material in communication with the catheter. Previous surgery at the right ileocecal junction region. 2 cm well-circumscribed hyperdense focus adjacent to the ileocecal valve not present previously. The differential diagnosis for this includes postprocedure hematoma versus rapid recurrence of tumor versus small leak collection with hyperdensity secondary to previously administered contrast. Vascular/Lymphatic: Mild aortic atherosclerosis. Reproductive: Previous hysterectomy.  Pessary in place. Other: No free fluid or air. Musculoskeletal: Chronic degenerative changes of the lumbar spine. IMPRESSION: Further continued decrease in right pelvic abscess. This appears completely drained presently, with only a small amount of density surrounding the drainage catheter. New demonstration of 2 small hyperdense areas in the right lower  quadrant adjacent to the anastomosis/drainage catheter, the larger measuring 2 cm in diameter. The differential diagnosis is postprocedural hematoma versus rapid recurrence of tumor versus is small leak collections. These warrant additional follow-up. Electronically Signed   By: Nelson Chimes M.D.   On: 01/16/2018 14:43   Ir Fluoro Guide Cv Line Right  Result Date: 01/17/2018 INDICATION: 84 year old female with a history bowel resection, poor nutrition, need for parental nutrition EXAM: IMAGE GUIDED TUNNELED CENTRAL CATHETER MEDICATIONS: None ANESTHESIA/SEDATION: Moderate (conscious) sedation was employed during this procedure. A total of Versed 1.0 mg and Fentanyl 50 mcg was administered intravenously. Moderate Sedation Time: 13 minutes. The patient's level of consciousness and vital signs were monitored continuously by radiology nursing throughout the procedure under my direct supervision. FLUOROSCOPY TIME:  Fluoroscopy Time: 0 minutes 6 seconds (0 mGy). COMPLICATIONS: None PROCEDURE: After written informed consent was obtained, patient was placed in the supine position on angiographic table. Patency of the right internal jugular vein was confirmed with ultrasound with image documentation. Patient was prepped and draped in the usual sterile fashion including the right neck and right superior chest. Using ultrasound guidance, the skin and subcutaneous tissues overlying the right internal jugular vein were generously infiltrated with 1% lidocaine without epinephrine.  Using ultrasound guidance, the right internal jugular vein was punctured with a micropuncture needle, and an 018 wire was advanced into the right heart confirming venous access. A small stab incision was made with an 11 blade scalpel. Peel-away sheath was placed over the wire, and then the wire was removed, marking the wire for estimation of internal catheter length. The chest wall was then generously infiltrated with 1% lidocaine for local  anesthesia along the tissue tract. Small stab incision was made with 11 blade scalpel, and then the catheter was back tunneled to the puncture site at the right internal jugular vein. Catheter was pulled through the tract, with the catheter amputated at 20 cm. Catheter was advanced through the peel-away sheath, and the peel-away sheath was removed. Final image was stored. The catheter was anchored to the chest wall with 2 retention sutures, and Derma bond was used to seal the right internal jugular vein incision site and at the right chest wall. Patient tolerated the procedure well and remained hemodynamically stable throughout. No complications were encountered and no significant blood loss was encountered. IMPRESSION: Status post right IJ tunneled central catheter. Signed, Dulcy Fanny. Earleen Newport, DO [facility] Radiology Electronically Signed   By: Corrie Mckusick D.O.   On: 01/17/2018 09:45   Ir US Guide Vasc Access Right  Result Date: 01/17/2018 INDICATION: 84 year old female with a history bowel resection, poor nutrition, need for parental nutrition EXAM: IMAGE GUIDED TUNNELED CENTRAL CATHETER MEDICATIONS: None ANESTHESIA/SEDATION: Moderate (conscious) sedation was employed during this procedure. A total of Versed 1.0 mg and Fentanyl 50 mcg was administered intravenously. Moderate Sedation Time: 13 minutes. The patient's level of consciousness and vital signs were monitored continuously by radiology nursing throughout the procedure under my direct supervision. FLUOROSCOPY TIME:  Fluoroscopy Time: 0 minutes 6 seconds (0 mGy). COMPLICATIONS: None PROCEDURE: After written informed consent was obtained, patient was placed in the supine position on angiographic table. Patency of the right internal jugular vein was confirmed with ultrasound with image documentation. Patient was prepped and draped in the usual sterile fashion including the right neck and right superior  chest. Using ultrasound guidance, the skin and subcutaneous tissues overlying the right internal jugular vein were generously infiltrated with 1% lidocaine without epinephrine. Using ultrasound guidance, the right internal jugular vein was punctured with a micropuncture needle, and an 018 wire was advanced into the right heart confirming venous access. A small stab incision was made with an 11 blade scalpel. Peel-away sheath was placed over the wire, and then the wire was removed, marking the wire for estimation of internal catheter length. The chest wall was then generously infiltrated with 1% lidocaine for local anesthesia along the tissue tract. Small stab incision was made with 11 blade scalpel, and then the catheter was back tunneled to the puncture site at the right internal jugular vein. Catheter was pulled through the tract, with the catheter amputated at 20 cm. Catheter was advanced through the peel-away sheath, and the peel-away sheath was removed. Final image was stored. The catheter was anchored to the chest wall with 2 retention sutures, and Derma bond was used to seal the right internal jugular vein incision site and at the right chest wall. Patient tolerated the procedure well and remained hemodynamically stable throughout. No complications were encountered and no significant blood loss was encountered. IMPRESSION: Status post right IJ tunneled central catheter. Signed, Dulcy Fanny. Earleen Newport, DO [facility] Radiology Special[facility] Radiology Electronically Signed   By: [place] Cerise  Earleen Newport D.O.   On: 01/17/2018 09:45    Labs:  CBC: Recent Labs    01/13/18 1756 01/14/18 0425 01/15/18 0415 01/17/18 0647  WBC 8.0 9.1 13.7* 8.1  HGB 8.9* 8.9* 8.6* 8.4*  HCT 28.9* 29.6* 28.8* 28.8*  PLT 885* 915* 915* 746*    COAGS: Recent Labs    11/28/17 1220 12/24/17 1301 01/08/18 1452 01/17/18 0718  INR 1.08 1.25 1.19 1.25    BMP: Recent Labs    01/15/18 0415 01/15/18 0855  01/16/18 0444 01/17/18 0647  NA 146* 149* 150* 150*  K 5.9* 3.9 3.6 3.0*  CL 111 113* 112* 115*  CO2 23 23 25 22   GLUCOSE 497* 207* 98 127*  BUN 30* 33* 34* 28*  CALCIUM 8.6* 8.9 8.8* 8.5*  CREATININE 1.06* 1.13* 1.37* 1.20*  GFRNONAA 47* 43* 34* 40*  GFRAA 54* 50* 40* 47*    LIVER FUNCTION TESTS: Recent Labs    01/10/18 0401 01/14/18 0425 01/16/18 0444 01/17/18 0647  BILITOT 0.6 0.5 0.6 0.7  AST 27 31 33 25  ALT 32 38 40 31  ALKPHOS 182* 186* 162* 134*  PROT 5.7* 6.1* 6.3* 6.0*  ALBUMIN 1.7* 1.9* 2.0* 1.9*    Assessment and Plan: Intra-abdominal fluid collection Drain remains intact.  Output still variable 15-30 mL/day, now bloody-- stopped lovenox. Reviewed imaging with IR MD.  Hematoma is improved.  New findings noted.  No new drainable collections.  Keep drain in place for now.  Continue current management.   Electronically Signed: Docia Barrier, PA 01/17/2018, 2:29 PM   I spent a total of 15 Minutes at the the patient's bedside AND on the patient's hospital floor or unit, greater than 50% of which was counseling/coordinating care for poor venous access, intra-abdominal fluid collection.

## 2018-01-17 NOTE — Sedation Documentation (Signed)
Patient is resting comfortably. 

## 2018-01-17 NOTE — Progress Notes (Addendum)
21 Days Post-Op   Subjective/Chief Complaint: Still confused.      Objective: Vital signs in last 24 hours: Temp:  [97.4 F (36.3 C)-98.4 F (36.9 C)] 97.4 F (36.3 C) (01/31 0535) Pulse Rate:  [90-105] 98 (01/31 0929) Resp:  [12-20] 15 (01/31 0929) BP: (111-131)/(50-70) 131/64 (01/31 0929) SpO2:  [99 %-100 %] 100 % (01/31 0929) Last BM Date: 01/16/18  Intake/Output from previous day: 01/30 0701 - 01/31 0700 In: 810 [I.V.:650; IV Piggyback:150] Out: 18 [Urine:2; Drains:15; Stool:1] Intake/Output this shift: Total I/O In: 5 [I.V.:5] Out: -   General appearance: remains a little confused  Resp: breathing comfortably GI: soft, non-tender; RLQ drain with bloody drainage around insertion site.  Drain output serous.  Bloody urine.   Midline incision - thin strip of granulation tissue, clean; minimal drainage  Lab Results:  Recent Labs    01/15/18 0415 01/17/18 0647  WBC 13.7* 8.1  HGB 8.6* 8.4*  HCT 28.8* 28.8*  PLT 915* 746*   BMET Recent Labs    01/16/18 0444 01/17/18 0647  NA 150* 150*  K 3.6 3.0*  CL 112* 115*  CO2 25 22  GLUCOSE 98 127*  BUN 34* 28*  CREATININE 1.37* 1.20*  CALCIUM 8.8* 8.5*   PT/INR Recent Labs    01/17/18 0718  LABPROT 15.6*  INR 1.25   ABG No results for input(s): PHART, HCO3 in the last 72 hours.  Invalid input(s): PCO2, PO2  Studies/Results: Ct Abdomen Pelvis Wo Contrast  Result Date: 01/16/2018 CLINICAL DATA:  Lower abdominal pain. Patient would not drink contrast secondary to confusion. EXAM: CT ABDOMEN AND PELVIS WITHOUT CONTRAST TECHNIQUE: Multidetector CT imaging of the abdomen and pelvis was performed following the standard protocol without IV contrast. COMPARISON:  CT 01/07/2018 FINDINGS: Lower chest: Lung bases are clear.  No pleural or pericardial fluid. Hepatobiliary: Normal Pancreas: Normal Spleen: Normal Adrenals/Urinary Tract: Adrenal glands are normal. Kidneys are normal except for a hyperdense cyst in the right  midportion measuring 15 mm in diameter, inapparent on the previous contrast-enhanced study. Stomach/Bowel: No evidence of bowel obstruction. Drainage catheter in the right pelvis with good drainage of the previously seen large right-sided abscess. Small amount of residual material in communication with the catheter. Previous surgery at the right ileocecal junction region. 2 cm well-circumscribed hyperdense focus adjacent to the ileocecal valve not present previously. The differential diagnosis for this includes postprocedure hematoma versus rapid recurrence of tumor versus small leak collection with hyperdensity secondary to previously administered contrast. Vascular/Lymphatic: Mild aortic atherosclerosis. Reproductive: Previous hysterectomy.  Pessary in place. Other: No free fluid or air. Musculoskeletal: Chronic degenerative changes of the lumbar spine. IMPRESSION: Further continued decrease in right pelvic abscess. This appears completely drained presently, with only a small amount of density surrounding the drainage catheter. New demonstration of 2 small hyperdense areas in the right lower quadrant adjacent to the anastomosis/drainage catheter, the larger measuring 2 cm in diameter. The differential diagnosis is postprocedural hematoma versus rapid recurrence of tumor versus is small leak collections. These warrant additional follow-up. Electronically Signed   By: Nelson Chimes M.D.   On: 01/16/2018 14:43   Ir Fluoro Guide Cv Line Right  Result Date: 01/17/2018 INDICATION: 84 year old female with a history bowel resection, poor nutrition, need for parental nutrition EXAM: IMAGE GUIDED TUNNELED CENTRAL CATHETER MEDICATIONS: None ANESTHESIA/SEDATION: Moderate (conscious) sedation was employed during this procedure. A total of Versed 1.0 mg and Fentanyl 50 mcg was administered intravenously. Moderate Sedation Time: 13 minutes. The patient's level of  consciousness and vital signs were monitored continuously by  radiology nursing throughout the procedure under my direct supervision. FLUOROSCOPY TIME:  Fluoroscopy Time: 0 minutes 6 seconds (0 mGy). COMPLICATIONS: None PROCEDURE: After written informed consent was obtained, patient was placed in the supine position on angiographic table. Patency of the right internal jugular vein was confirmed with ultrasound with image documentation. Patient was prepped and draped in the usual sterile fashion including the right neck and right superior chest. Using ultrasound guidance, the skin and subcutaneous tissues overlying the right internal jugular vein were generously infiltrated with 1% lidocaine without epinephrine. Using ultrasound guidance, the right internal jugular vein was punctured with a micropuncture needle, and an 018 wire was advanced into the right heart confirming venous access. A small stab incision was made with an 11 blade scalpel. Peel-away sheath was placed over the wire, and then the wire was removed, marking the wire for estimation of internal catheter length. The chest wall was then generously infiltrated with 1% lidocaine for local anesthesia along the tissue tract. Small stab incision was made with 11 blade scalpel, and then the catheter was back tunneled to the puncture site at the right internal jugular vein. Catheter was pulled through the tract, with the catheter amputated at 20 cm. Catheter was advanced through the peel-away sheath, and the peel-away sheath was removed. Final image was stored. The catheter was anchored to the chest wall with 2 retention sutures, and Derma bond was used to seal the right internal jugular vein incision site and at the right chest wall. Patient tolerated the procedure well and remained hemodynamically stable throughout. No complications were encountered and no significant blood loss was encountered. IMPRESSION: Status post right IJ tunneled central catheter. Signed, Dulcy Fanny. Earleen Newport, DO Vascular and Interventional Radiology  Specialists [facility] Radiology Electronically Signed   By: Corrie Mckusick D.O.   On: 01/17/2018 09:45   Ir US Guide Vasc Access Right  Result Date: 01/17/2018 INDICATION: 84 year old female with a history bowel resection, poor nutrition, need for parental nutrition EXAM: IMAGE GUIDED TUNNELED CENTRAL CATHETER MEDICATIONS: None ANESTHESIA/SEDATION: Moderate (conscious) sedation was employed during this procedure. A total of Versed 1.0 mg and Fentanyl 50 mcg was administered intravenously. Moderate Sedation Time: 13 minutes. The patient's level of consciousness and vital signs were monitored continuously by radiology nursing throughout the procedure under my direct supervision. FLUOROSCOPY TIME:  Fluoroscopy Time: 0 minutes 6 seconds (0 mGy). COMPLICATIONS: None PROCEDURE: After written informed consent was obtained, patient was placed in the supine position on angiographic table. Patency of the right internal jugular vein was confirmed with ultrasound with image documentation. Patient was prepped and draped in the usual sterile fashion including the right neck and right superior chest. Using ultrasound guidance, the skin and subcutaneous tissues overlying the right internal jugular vein were generously infiltrated with 1% lidocaine without epinephrine. Using ultrasound guidance, the right internal jugular vein was punctured with a micropuncture needle, and an 018 wire was advanced into the right heart confirming venous access. A small stab incision was made with an 11 blade scalpel. Peel-away sheath was placed over the wire, and then the wire was removed, marking the wire for estimation of internal catheter length. The chest wall was then generously infiltrated with 1% lidocaine for local anesthesia along the tissue tract. Small stab incision was made with 11 blade scalpel, and then the catheter was back tunneled to the puncture site at the right internal jugular vein. Catheter was pulled through the tract, with  the catheter amputated at 20 cm. Catheter was advanced through the peel-away sheath, and the peel-away sheath was removed. Final image was stored. The catheter was anchored to the chest wall with 2 retention sutures, and Derma bond was used to seal the right internal jugular vein incision site and at the right chest wall. Patient tolerated the procedure well and remained hemodynamically stable throughout. No complications were encountered and no significant blood loss was encountered. IMPRESSION: Status post right IJ tunneled central catheter. Signed, Dulcy Fanny. Earleen Newport, DO [facility] Inter[facility] Radiology Specialists [facility] Radiology Electronically Signed   By: Corrie Mckusick D.O.   On: 01/17/2018 09:45    Anti-infectives: Anti-infectives (From admission, onward)   Start     Dose/Rate Route Frequency Ordered Stop   12/31/17 1300  piperacillin-tazobactam (ZOSYN) IVPB 3.375 g     3.375 g 12.5 mL/hr over 240 Minutes Intravenous Every 8 hours 12/31/17 1252        Assessment/Plan: s/p Procedure(s): DIAGNOSTIC LAPAROSCOPY/ LAPAROTOMY WITH SMALL BOWEL RESECTION TIMES TWO (N/A)   Drain for fluid collection. multiple organisms, none predominant.  Continue antibiotics for 14 days total Repeat CT with near resolution of other abscess. ? Tumor nodules vs small abscesses.   DVT on prophylactic dose lovenox, once HCT more stable, will change back to therapeutic dosing. Continue TPN - calorie counts are pretty much zero.  Prealbumin came up.    Mobilize with tx as tolerated.   Medicine consult given waxing and waning MS.   Speech therapy to see if aversion to eating is more retching from aspiration or upper airway issues instead of true nausea. Severe protein calorie malnutrition improving.    LOS: 24 days    Theresa Howe 01/17/2018

## 2018-01-17 NOTE — Progress Notes (Signed)
Pt for IR today for central line tunnelled placement, NPO midnight.

## 2018-01-17 NOTE — Progress Notes (Signed)
Nutrition Follow-up  DOCUMENTATION CODES:   Severe malnutrition in context of chronic illness  INTERVENTION:   -D/c Boost Breeze po TID, each supplement provides 250 kcal and 9 grams of protein -TPN management per pharmacy  NUTRITION DIAGNOSIS:   Severe Malnutrition related to chronic illness(stage IV neuroendocrine tumor) as evidenced by energy intake < 75% for > or equal to 1 month, severe fat depletion, severe muscle depletion.  Ongoing  GOAL:   Patient will meet greater than or equal to 90% of their needs  Progressing  MONITOR:   Diet advancement, Labs, Weight trends, I & O's, Skin, Other (Comment)(TPN)  REASON FOR ASSESSMENT:   Consult Calorie Count  ASSESSMENT:   Patient is a 84 year old female who is status post laparoscopic assisted small bowel resection in mid December.  She was discharged to a skilled nursing facility.  She was doing better, but now is throwing up almost everything that she eats.  She is able to keep down a small amount of Ensure.  She also continues to have some postop pain.  She denies fevers and chills.  She is having difficulty working with physical therapy due to weakness.  She is also having difficulty taking her medication because of nausea and vomiting.  1/7- PICC placed 1/10- s/pDiagnostic laparoscopy, exploratory laparotomy, small bowel resection x 2. 1/11- NGT d/c 1/12- NGT reinserted, d/c 1/23- s/p dran placement for RLQ fluid and gas collection (90 ml yielded) due to intra-abdominal abscess  PICC replaced this AM.   15 ml output from drains. Pt +792 ml x 24 hours and +9.2 L since admission.   Pt and family in with CSW at time of visit. Noted pt family is considering SNF vs home at time of discharge.   Calorie count was done this week and pt was eating very minimally. Pt is now NPO, remains dependent on TPN. Pt currently receivingTPN at reduced rate 50 ml/hr, providing 70g AA, 180g CHO, and 29g ILE, which equals to 1178 kCals  per day, meeting >80% of patient needs. Per pharmacy note, plan to increase TPN back to goal rate 65 ml/hr with patient unable to tolerate much PO intake at all so far. TPN will provide 90g AA, 234g CHO, and 37g ILE, which equals to 1532 kCals per day, meeting 100% of patient needs  Labs reviewed: Na: 150, K: 3.0, , Mg and Phos WDL, CBGS: 99-169 (inpatient orders for glycemic control 0-15 units insulin aspart every 4 hours).   Diet Order:  TPN ADULT (ION) Diet NPO time specified Except for: Sips with Meds TPN ADULT (ION)  EDUCATION NEEDS:   Education needs have been addressed  Skin:  Skin Assessment: Skin Integrity Issues: Skin Integrity Issues:: Stage II, Incisions, Stage III Stage II: buttocks Stage III: coccyx Incisions: abdomen  Last BM:  01/10/18  Height:   Ht Readings from Last 1 Encounters:  12/24/17 5\' 5"  (1.651 m)    Weight:   Wt Readings from Last 1 Encounters:  01/14/18 119 lb 0.8 oz (54 kg)    Ideal Body Weight:  56.8 kg  BMI:  Body mass index is 19.81 kg/m.  Estimated Nutritional Needs:   Kcal:  1500-1700  Protein:  75-90 grams  Fluid:  > 1.5 L    Bradleigh Sonnen A. Jimmye Norman, RD, LDN, CDE Pager: [phone] After hours Pager: [phone]

## 2018-01-17 NOTE — Evaluation (Signed)
Clinical/Bedside Swallow Evaluation Patient Details  Name: Theresa Howe MRN: [id number] Date of Birth: 07/06/1931  Today's Date: 01/17/2018 Time: SLP Start Time (ACUTE ONLY): 1531 SLP Stop Time (ACUTE ONLY): 1353 SLP Time Calculation (min) (ACUTE ONLY): 1342 min  Past Medical History:  Past Medical History:  Diagnosis Date  . Abdominal mass 10/2017   SMALL BOWEL  . Diabetes mellitus    type 2  . Dyspnea   . Failure to thrive (0-17) 12/24/2017  . Glaucoma   . Hyperlipidemia   . Hypertension   . Neuropathy   . Osteoporosis   . Wheezing    Past Surgical History:  Past Surgical History:  Procedure Laterality Date  . ABDOMINAL HYSTERECTOMY    . CATARACT EXTRACTION Right 01/2010  . COLON SURGERY    . COLONOSCOPY    . EUS N/A 04/17/2013   Procedure: UPPER ENDOSCOPIC ULTRASOUND (EUS) LINEAR;  Surgeon: Rachael Fee, MD;  Location: [facility];  Service: Endoscopy;  Laterality: N/A;  . GLAUCOMA SURGERY  2007  . IR FLUORO GUIDE CV LINE RIGHT  01/17/2018  . IR IMAGE GUIDED DRAINAGE PERCUT CATH  PERITONEAL RETROPERIT  01/09/2018  . IR US GUIDE VASC ACCESS RIGHT  01/17/2018  . LAPAROSCOPIC SMALL BOWEL RESECTION  12/04/2017  . LAPAROSCOPIC SMALL BOWEL RESECTION N/A 12/04/2017   Procedure: LAPAROSCOPIC SMALL BOWEL RESECTION;  Surgeon: Almond Lint, MD;  Location: [facility] OR;  Service: General;  Laterality: N/A;  . LAPAROSCOPY N/A 12/04/2017   Procedure: LAPAROSCOPY DIAGNOSTIC;  Surgeon: Almond Lint, MD;  Location: [facility] OR;  Service: General;  Laterality: N/A;  . LAPAROSCOPY N/A 12/27/2017   Procedure: DIAGNOSTIC LAPAROSCOPY/ LAPAROTOMY WITH SMALL BOWEL RESECTION TIMES TWO;  Surgeon: Almond Lint, MD;  Location: [facility] OR;  Service: General;  Laterality: N/A;  . MULTIPLE TOOTH EXTRACTIONS    . PICC LINE INSERTION  12/24/2017   HPI:  84 year old with stage IV neuroendocrine small bowel cancer s/p ex-lap with SBR in December. Patient was re-admitted from SNF after she began having nausea and  vomiting and unable to tolerate enteral feeding. Found to have a large dilated segment of bowel with bezoar and necrosis, now s/p SBR x 2.   Assessment / Plan / Recommendation Clinical Impression    Pt had immediate cough on 1 out of 2 trials of thin liquid. However, unsure if cough was related to airway compromise or gagging. Pt refused further trials, so liquid recommendation is inconclusive. Pt consumed x2 trials of puree, with continued nausea. Pt benefited from mod verbal cues to swallow, but her aversion to solids appeared to be secondary to nausea. Pt reports eating softer solids PTA. Diet recommendations per MD given limited observation. SLP will follow up for further diagnostic PO trials.     SLP Visit Diagnosis: Dysphagia, oropharyngeal phase (R13.12)    Aspiration Risk  Mild aspiration risk    Diet Recommendation Other (Comment)(defer to MD team)   Medication Administration: Via alternative means Supervision: Full supervision/cueing for compensatory strategies Compensations: Minimize environmental distractions Postural Changes: Seated upright at 90 degrees    Other  Recommendations Recommended Consults: Consider GI evaluation;Consider esophageal assessment   Follow up Recommendations Skilled Nursing facility      Frequency and Duration min 2x/week  2 weeks       Prognosis Prognosis for Safe Diet Advancement: Fair Barriers to Reach Goals: Severity of deficits      Swallow Study   General HPI: 84 year old with stage IV neuroendocrine small bowel cancer s/p  ex-lap with SBR in December. Patient was re-admitted from SNF after she began having nausea and vomiting and unable to tolerate enteral feeding. Found to have a large dilated segment of bowel with bezoar and necrosis, now s/p SBR x 2. Type of Study: Bedside Swallow Evaluation Previous Swallow Assessment: none Diet Prior to this Study: NPO Temperature Spikes Noted: No Respiratory Status: Room air History of Recent  Intubation: No(surgery only) Behavior/Cognition: Cooperative;Pleasant mood Oral Cavity Assessment: Within Functional Limits Oral Care Completed by SLP: No Oral Cavity - Dentition: Edentulous Self-Feeding Abilities: Needs assist Patient Positioning: Upright in bed Baseline Vocal Quality: Normal Volitional Cough: Weak Volitional Swallow: Able to elicit    Oral/Motor/Sensory Function Overall Oral Motor/Sensory Function: Within functional limits   Ice Chips Ice chips: Not tested   Thin Liquid Thin Liquid: Impaired Presentation: Straw Pharyngeal  Phase Impairments: Cough - Immediate    Nectar Thick Nectar Thick Liquid: Not tested   Honey Thick Honey Thick Liquid: Not tested   Puree Puree: Impaired Presentation: Spoon Oral Phase Functional Implications: Oral holding   Solid   GO  [place] Grason Brailsford SLP Student Clinician   Solid: Not tested        [place] Karly Pitter 01/17/2018,4:49 PM

## 2018-01-17 NOTE — Progress Notes (Signed)
PT Cancellation Note  Patient Details Name: Theresa Howe MRN: [id number] DOB: [birth year]   Cancelled Treatment:    Reason Eval/Treat Not Completed: Other (comment).  Pt unavailable with pt care and will try later as time and pt allow.   Ramond Dial 01/17/2018, 10:39 AM   Mee Hives, PT MS Acute Rehab Dept. Number: [place] and [place]

## 2018-01-17 NOTE — Progress Notes (Addendum)
PHARMACY - ADULT TOTAL PARENTERAL NUTRITION CONSULT NOTE   Pharmacy Consult: TPN Indication: Intolerance to enteral feeding  Patient Measurements: Height: '5\' 5"'  (165.1 cm)(12/04/17) Weight: 119 lb 0.8 oz (54 kg) IBW/kg (Calculated) : 57 TPN AdjBW (KG): 57.6 Body mass index is 19.81 kg/m.  Assessment:  35  YOM with stage IV neuroendocrine small bowel cancer s/p ex-lap with SBR in December.  Patient was re-admitted from SNF after she began having nausea and vomiting and unable to tolerate enteral feeding.  Found to have a large dilated segment of bowel with bezoar and necrosis, now s/p SBR x 2.  Pharmacy managing TPN for nutritional support.  GI: prealbumin 5.3 >> 27.3, having BMs, RLQ drain O/P 15 ml/hr - PPI. PRN Zofran/Phenergan - continued N/V. Possible choledocholithiasis on CT, repeat CT 1/30 - postprocedural hematoma vs rapid recurrence of tumor vs small leak collections Endo: DM on metformin PTA. CBGs acceptable. Relying mostly on SSI to cover for hyperglycemia instead of increasing insulin in TPN and exposing pt to risk of hypoglycemia.  Insulin requirements in the past 24 hours: 10 units SSI + 25 units in TPN (TPN not hung 1/30) Lytes: Na stable 150, Cl up to 115, K down to 3, CoCa 10.1 Renal: SCr down to 1.2, BUN down to 28 - no UOP charted, net +9.2L since admit, D5W at 50 ml/hr, LR at [place]: stable on RA Cards: BP variable, HR 100s - asa AC: Lovenox acute nonocclusive DVT - hgb low but stable, plts down to 915. Vaginal bleeding 1/27 and continuing, so Lovenox reduced to 61m SQ Q12H by MD, now to 536mSQ q24h with CrCl<30. Hepatobil: alk phos elevated, AST/ALT/tbili WNL. TG 133 >> 172 Neuro: confused - Lexapro, Remeron, s/p Haldol for sundowning (QTc 44799m morphine prn ID: Zosyn (1/14 >> ) for IAI. 1/21 CT showed abscess (cx with mult organisms, none predominant) - afebrile, WBC down to wnl TPN Access: PICC placed 12/24/17>> pulled out 1/17; replaced 01/04/18>>pulled out again  1/30, replace with R IJ 1/31>> TPN start date: 12/25/17  Nutritional Goals (per RD rec on 1/21): 1500-1700 kCal and 75-90 gram protein per day  Current Nutrition: TPN - no 1/30 TPN due to pt pulling PICC out Soft diet (minimal to no intake) Boost Breeze TID (provides 250 kcal + 9 grams protein for each supplement - refusing all doses currently)   Plan:  Increase TPN back to goal rate 65 ml/hr with patient unable to tolerate much PO intake at all so far TPN will provide 90g AA, 234g CHO, and 37g ILE, which equals to 1532 kCals per day, meeting 100% of patient needs Electrolytes in TPN: continue max acetate, reduce calcium 1/29, reduce Na again 1/31 Daily multivitamin and trace elements in TPN Continue moderate SSI Q4H + increase insulin in TPN back to 30 units (TPN rate increased back to goal) Continue D5W at 50 ml/hr + [facility] F/U PO intake/diet advancement and ability to wean TPN F/U AM labs, resolution of bleeding  K runs x 3 + increased K in TPN with rate increase   HalElicia LampharmD, BCPS Clinical Pharmacist Clinical phone for 01/17/2018 until 3:30pm: [phone] after 3:30pm, please call main pharmacy at: [phone] 01/17/2018 8:37 AM

## 2018-01-17 NOTE — Consult Note (Signed)
[facility] Medical Consultation  Theresa Howe HKV:[id number] DOB: Aug 27, 1931 DOA: 12/24/2017 PCP: [name], MD   Requesting physician: Donell Beers, MD  Date of consultation:01/17/2018  Reason for consultation: generalized weakness, nausea, vomiting, failure to thrive  Impression/Recommendations Principal Problem:   Failure to thrive (0-17) Active Problems:   Diabetes with neurologic complications (HCC)   HTN (hypertension)   Glaucoma   Protein-calorie malnutrition, severe   Carcinomatosis (HCC)    Failure to thrive - multifactorial, probably associated with intrabdominal infection, malignancy, debility of severe chronic illness Will order head CT and oncology consult  DM type II - continue SSI, low carb diet as tolerated and allowed by surgical service  HTN - currently on IV Hydralazine prn and BP is reasonably controlled. Continue same and follow BP   Electrolyte imbalance with hypernatremia and hypokalemia - are being corrected and will follow levels  Glaucoma -continue home drops, Xalatan and Trusopt  We will followup again tomorrow. Please contact me if I can be of assistance in the meanwhile. Thank you for this consultation.  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 She underwent diagnostic laparoscopy and laparotomy with small bowel resection twice- on 12/04/2017 and 12/27/2017.  Patient was discharged to skilled nursing facility for rehabilitation after his first surgery, however during her stay there she developed multiple episodes of emesis and consequently became severely dehydrated. She was readmitted on 12/27/2017 and in the hospital since then.   On 01/09/2018 she was diagnosed with the right lower quadrant abdominal abscess and underwent placement  of the drain and was treated with broad-spectrum antibiotic-Zosyn.  Nevertheless patient remained, very weak unable to maintain oral intake due to persistent vomiting and difficulty swallowing.  She is now on IV hydration and TNA.  Creatinine was 1.06 on 1/29 and increased to 1.37 on 1/30 Sosa developed hypernatremia (Na150) and hypokalemia K3.0) Hgb today 8.4 and was 10.5 on the day of her first surgery 12/04/17  Review of Systems:  Patient c/o poor appetite, nausea and weakness. Denied chest pain, abdominal pain, dysuria. All other ROS is negative  Past Medical History:  Diagnosis Date  . Abdominal mass 10/2017   SMALL BOWEL  . Diabetes mellitus    type 2  . Dyspnea   . Failure to thrive (0-17) 12/24/2017  . Glaucoma   . Hyperlipidemia   . Hypertension   . Neuropathy   . Osteoporosis   . Wheezing    Past Surgical History:  Procedure Laterality Date  . ABDOMINAL HYSTERECTOMY    . CATARACT EXTRACTION Right 01/2010  . COLON SURGERY    . COLONOSCOPY    . EUS N/A 04/17/2013   Procedure: UPPER ENDOSCOPIC ULTRASOUND (EUS) LINEAR;  Surgeon: Rachael Fee, MD;  Location: [facility];  Service: Endoscopy;  Laterality: N/A;  . GLAUCOMA SURGERY  2007  . IR FLUORO GUIDE CV LINE RIGHT  01/17/2018  . IR IMAGE GUIDED DRAINAGE PERCUT CATH  PERITONEAL RETROPERIT  01/09/2018  . IR US GUIDE VASC ACCESS RIGHT  01/17/2018  . LAPAROSCOPIC SMALL BOWEL RESECTION  12/04/2017  . LAPAROSCOPIC SMALL BOWEL RESECTION N/A 12/04/2017   Procedure: LAPAROSCOPIC SMALL BOWEL RESECTION;  Surgeon: Almond Lint, MD;  Location: [facility] OR;  Service: General;  Laterality: N/A;  . LAPAROSCOPY N/A 12/04/2017   Procedure: LAPAROSCOPY DIAGNOSTIC;  Surgeon: Almond Lint, MD;  Location: [facility] OR;  Service: General;  Laterality: N/A;  . LAPAROSCOPY N/A 12/27/2017   Procedure: DIAGNOSTIC LAPAROSCOPY/ LAPAROTOMY WITH SMALL BOWEL RESECTION TIMES TWO;  Surgeon: Almond Lint, MD;  Location: [facility] OR;  Service: General;  Laterality:  N/A;  . MULTIPLE TOOTH EXTRACTIONS    . PICC LINE INSERTION  12/24/2017   Social History:  reports that  has never smoked. she has never used smokeless tobacco. She reports that she does not drink alcohol or use drugs.  No Known Allergies History reviewed. No pertinent family history.  Prior to Admission medications   Medication Sig Start Date End Date Taking? Authorizing Provider  acetaminophen (TYLENOL) 325 MG tablet Take 2 tablets (650 mg total) by mouth every 6 (six) hours as needed for mild pain (or Fever >/= 101). 12/13/17  Yes Sherrie George, PA-C  albuterol [facility] HFA) 108 (90 Base) MCG/ACT inhaler Inhale 2 puffs into the lungs every 6 (six) hours as needed for wheezing or shortness of breath. 12/23/15  Yes [place], Velna Hatchet, MD  aspirin 81 MG tablet Take 81 mg by mouth daily.     Yes [provider]  Calcium Carbonate-Vitamin D (CALCIUM 600 + D PO) Take 1 tablet by mouth daily.    Yes [provider]  collagenase (SANTYL) ointment Apply 1 application topically daily. Left buttock   Yes [provider]  docusate sodium (COLACE) 100 MG capsule Take 1 capsule (100 mg total) by mouth 2 (two) times daily. 12/13/17  Yes Sherrie George, PA-C  dorzolamide (TRUSOPT) 2 % ophthalmic solution Place 1 drop into both eyes 2 (two) times daily. 06/02/16  Yes [place], Velna Hatchet, MD  escitalopram (LEXAPRO) 10 MG tablet TAKE 1 TABLET AT BEDTIME Patient taking differently: TAKE 5mg  AT BEDTIME 12/03/17  Yes [place], Velna Hatchet, MD  feeding supplement, ENSURE ENLIVE, (ENSURE ENLIVE) LIQD Take 237 mLs by mouth 3 (three) times daily between meals. 12/13/17  Yes Sherrie George, PA-C  fluticasone [facility]) 50 MCG/ACT nasal spray USE 2 SPRAYS IN EACH       NOSTRIL DAILY 12/24/17  Yes [place], Velna Hatchet, MD  gabapentin (NEURONTIN) 100 MG capsule TAKE 1 TO 2 CAPSULES AT    BEDTIME FOR NEUROPATHY Patient taking differently: Take 100 mg by mouth at bedtime.  12/03/17  Yes [place], Velna Hatchet, MD  latanoprost (XALATAN) 0.005 % ophthalmic solution Place 1 drop into both eyes at bedtime. 10/01/17  Yes [place], Velna Hatchet, MD  losartan-hydrochlorothiazide (HYZAAR) 50-12.5 MG tablet Take 1 tablet daily by mouth. 10/25/17  Yes [place], Velna Hatchet, MD  metFORMIN (GLUCOPHAGE) 1000 MG tablet Take 0.5 tablets (500 mg total) by mouth 2 (two) times daily with a meal. 12/26/16  Yes [place], Velna Hatchet, MD  mirtazapine (REMERON) 15 MG tablet Take 0.5 tablets (7.5 mg total) by mouth at bedtime. For appetite 10/04/17  Yes , Velna Hatchet, MD  Multiple Vitamins-Minerals (CENTRUM SILVER PO) Take 1 tablet by mouth daily.    Yes [provider]  omeprazole (PRILOSEC) 40 MG capsule Take 40 mg by mouth daily.   Yes [provider]  ondansetron (ZOFRAN) 4 MG tablet Take 4 mg by mouth every 6 (six) hours as needed for nausea or vomiting.   Yes [provider]  oxyCODONE (ROXICODONE) 5 MG/5ML solution Take 5 mLs (5 mg total) by mouth every 6 (six) hours. Patient taking differently: Take 5 mg by mouth every 6 (six) hours as needed for severe pain.  12/13/17  Yes Sherrie George, PA-C  polyethylene glycol [facility] Ethelene Hal)  packet Take 17 g by mouth daily as needed for moderate constipation. 12/13/17  Yes Sherrie George, PA-C  alendronate (FOSAMAX) 70 MG tablet TAKE 1 TABLET EVERY 7 DAYS WITH A FULL GLASS OF WATER ON AN EMPTY STOMACH 12/25/17   [name], MD  ONE [facility] ULTRA TEST test strip TEST FASTING BLOOD SUGAR ONCE DAILY **DX E11.4 09/10/17   [name], MD   Physical Exam: Blood pressure 131/64, pulse 98, temperature (!) 97.4 F (36.3 C), temperature source Oral, resp. rate 15, height 5\' 5"  (1.651 m), weight 54 kg (119 lb 0.8 oz), SpO2 100 %. Vitals:   01/17/18 0920 01/17/18 0929  BP: 115/63 131/64  Pulse: 99 98  Resp: 14 15  Temp:    SpO2: 100% 100%     General:  Very thin and frail looking AAF in NAD  Eyes: PERRLA, no discharge, conjunctivae  unicteric  ENT: normal, no lesions, mucous membranes are intact  Neck: supple, no lymphadenopathy, no thyromegaly, no masses palpated  Cardiovascular: RRR, no R/M/G  Respiratory: diminished BS, CTA bilaterally, no use of accessory muscles observed  Abdomen: flar, NT, ND, poorly audible BS  Skin: warm, dray, no lesions or rashes on limited exam, turgor diminished  Musculoskeletal: full active ROM, no joint swelling  Psychiatric: affect normal, answers all questions, alert and oriented x3  Neurologic: CN II-XII are intact, muscle tone preserve, sensations intact  Labs on Admission:  Basic Metabolic Panel: Recent Labs  Lab 01/14/18 0425 01/15/18 0415 01/15/18 0855 01/16/18 0444 01/17/18 0647  NA 148* 146* 149* 150* 150*  K 3.7 5.9* 3.9 3.6 3.0*  CL 110 111 113* 112* 115*  CO2 25 23 23 25 22   GLUCOSE 185* 497* 207* 98 127*  BUN 25* 30* 33* 34* 28*  CREATININE 0.81 1.06* 1.13* 1.37* 1.20*  CALCIUM 8.9 8.6* 8.9 8.8* 8.5*  MG 1.9  --   --  2.3 1.9  PHOS 3.6  --   --  3.5 3.3   Liver Function Tests: Recent Labs  Lab 01/14/18 0425 01/16/18 0444 01/17/18 0647  AST 31 33 25  ALT 38 40 31  ALKPHOS 186* 162* 134*  BILITOT 0.5 0.6 0.7  PROT 6.1* 6.3* 6.0*  ALBUMIN 1.9* 2.0* 1.9*   CBC: Recent Labs  Lab 01/13/18 0406 01/13/18 1756 01/14/18 0425 01/15/18 0415 01/17/18 0647  WBC 8.7 8.0 9.1 13.7* 8.1  NEUTROABS  --   --  6.3  --   --   HGB 9.0* 8.9* 8.9* 8.6* 8.4*  HCT 29.4* 28.9* 29.6* 28.8* 28.8*  MCV 85.7 86.5 86.5 89.7 89.4  PLT 991* 885* 915* 915* 746*   CBG: Recent Labs  Lab 01/17/18 0413 01/17/18 0748 01/17/18 1242 01/17/18 1609 01/17/18 1724  GLUCAP 169* 99 139* 59* 135*    Radiological Exams on Admission: Ct Abdomen Pelvis Wo Contrast  Result Date: 01/16/2018 CLINICAL DATA:  Lower abdominal pain. Patient would not drink contrast secondary to confusion. EXAM: CT ABDOMEN AND PELVIS WITHOUT CONTRAST TECHNIQUE: Multidetector CT imaging of the  abdomen and pelvis was performed following the standard protocol without IV contrast. COMPARISON:  CT 01/07/2018 FINDINGS: Lower chest: Lung bases are clear.  No pleural or pericardial fluid. Hepatobiliary: Normal Pancreas: Normal Spleen: Normal Adrenals/Urinary Tract: Adrenal glands are normal. Kidneys are normal except for a hyperdense cyst in the right midportion measuring 15 mm in diameter, inapparent on the previous contrast-enhanced study. Stomach/Bowel: No evidence of bowel obstruction. Drainage catheter in the right pelvis with good drainage  of the previously seen large right-sided abscess. Small amount of residual material in communication with the catheter. Previous surgery at the right ileocecal junction region. 2 cm well-circumscribed hyperdense focus adjacent to the ileocecal valve not present previously. The differential diagnosis for this includes postprocedure hematoma versus rapid recurrence of tumor versus small leak collection with hyperdensity secondary to previously administered contrast. Vascular/Lymphatic: Mild aortic atherosclerosis. Reproductive: Previous hysterectomy.  Pessary in place. Other: No free fluid or air. Musculoskeletal: Chronic degenerative changes of the lumbar spine. IMPRESSION: Further continued decrease in right pelvic abscess. This appears completely drained presently, with only a small amount of density surrounding the drainage catheter. New demonstration of 2 small hyperdense areas in the right lower quadrant adjacent to the anastomosis/drainage catheter, the larger measuring 2 cm in diameter. The differential diagnosis is postprocedural hematoma versus rapid recurrence of tumor versus is small leak collections. These warrant additional follow-up. Electronically Signed   By: Paulina Fusi M.D.   On: 01/16/2018 14:43   Ir Fluoro Guide Cv Line Right  Result Date: 01/17/2018 INDICATION: 84 year old female with a history bowel resection, poor nutrition, need for parental  nutrition EXAM: IMAGE GUIDED TUNNELED CENTRAL CATHETER MEDICATIONS: None ANESTHESIA/SEDATION: Moderate (conscious) sedation was employed during this procedure. A total of Versed 1.0 mg and Fentanyl 50 mcg was administered intravenously. Moderate Sedation Time: 13 minutes. The patient's level of consciousness and vital signs were monitored continuously by radiology nursing throughout the procedure under my direct supervision. FLUOROSCOPY TIME:  Fluoroscopy Time: 0 minutes 6 seconds (0 mGy). COMPLICATIONS: None PROCEDURE: After written informed consent was obtained, patient was placed in the supine position on angiographic table. Patency of the right internal jugular vein was confirmed with ultrasound with image documentation. Patient was prepped and draped in the usual sterile fashion including the right neck and right superior chest. Using ultrasound guidance, the skin and subcutaneous tissues overlying the right internal jugular vein were generously infiltrated with 1% lidocaine without epinephrine. Using ultrasound guidance, the right internal jugular vein was punctured with a micropuncture needle, and an 018 wire was advanced into the right heart confirming venous access. A small stab incision was made with an 11 blade scalpel. Peel-away sheath was placed over the wire, and then the wire was removed, marking the wire for estimation of internal catheter length. The chest wall was then generously infiltrated with 1% lidocaine for local anesthesia along the tissue tract. Small stab incision was made with 11 blade scalpel, and then the catheter was back tunneled to the puncture site at the right internal jugular vein. Catheter was pulled through the tract, with the catheter amputated at 20 cm. Catheter was advanced through the peel-away sheath, and the peel-away sheath was removed. Final image was stored. The catheter was anchored to the chest wall with 2 retention sutures, and Derma bond was used to seal the right  internal jugular vein incision site and at the right chest wall. Patient tolerated the procedure well and remained hemodynamically stable throughout. No complications were encountered and no significant blood loss was encountered. IMPRESSION: Status post right IJ tunneled central catheter. Signed, Yvone Neu. Loreta Ave, DO [facility] Radiology Electronically Signed   By: Gilmer Mor D.O.   On: 01/17/2018 09:45   Ir US Guide Vasc Access Right  Result Date: 01/17/2018 INDICATION: 84 year old female with a history bowel resection, poor nutrition, need for parental nutrition EXAM: IMAGE GUIDED TUNNELED CENTRAL CATHETER MEDICATIONS: None ANESTHESIA/SEDATION: Moderate (conscious) sedation was employed during this procedure.  A total of Versed 1.0 mg and Fentanyl 50 mcg was administered intravenously. Moderate Sedation Time: 13 minutes. The patient's level of consciousness and vital signs were monitored continuously by radiology nursing throughout the procedure under my direct supervision. FLUOROSCOPY TIME:  Fluoroscopy Time: 0 minutes 6 seconds (0 mGy). COMPLICATIONS: None PROCEDURE: After written informed consent was obtained, patient was placed in the supine position on angiographic table. Patency of the right internal jugular vein was confirmed with ultrasound with image documentation. Patient was prepped and draped in the usual sterile fashion including the right neck and right superior chest. Using ultrasound guidance, the skin and subcutaneous tissues overlying the right internal jugular vein were generously infiltrated with 1% lidocaine without epinephrine. Using ultrasound guidance, the right internal jugular vein was punctured with a micropuncture needle, and an 018 wire was advanced into the right heart confirming venous access. A small stab incision was made with an 11 blade scalpel. Peel-away sheath was placed over the wire, and then the wire was removed,  marking the wire for estimation of internal catheter length. The chest wall was then generously infiltrated with 1% lidocaine for local anesthesia along the tissue tract. Small stab incision was made with 11 blade scalpel, and then the catheter was back tunneled to the puncture site at the right internal jugular vein. Catheter was pulled through the tract, with the catheter amputated at 20 cm. Catheter was advanced through the peel-away sheath, and the peel-away sheath was removed. Final image was stored. The catheter was anchored to the chest wall with 2 retention sutures, and Derma bond was used to seal the right internal jugular vein incision site and at the right chest wall. Patient tolerated the procedure well and remained hemodynamically stable throughout. No complications were encountered and no significant blood loss was encountered. IMPRESSION: Status post right IJ tunneled central catheter. Signed, Yvone Neu. Loreta Ave, DO [facility] Radiology Electronically Signed   By: Gilmer Mor D.O.   On: 01/17/2018 09:45    EKG: normal tracing on EKG from12/12/18 Time spent: [address], [place] [facility] Pager [phone]  If 7PM-7AM, please contact night-coverage [email or web address] Password [facility] 01/17/2018, 5:57 PM

## 2018-01-17 NOTE — Progress Notes (Signed)
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 PMHx: DM, glaucoma, HTN, neuropathy    PT Comments    Pt is up to walk with assistance and noted her instability as well as sudden onset of vaginal bleeding as pt got up so quickly and no brief available.  Her nurse is aware, pt cleaned up and changed her gown.  Very confused and requires close supervision of all mobiltiy.  Will anticipate SNF needed still to safely progress mobility and to increase her tolerance for standing, balance skills.  The symptoms of her condition are creating a safety issue and will need to be addressed to effectively rehabilitate her.   Follow Up Recommendations  SNF;Supervision/Assistance - 24 hour     Equipment Recommendations  3in1 (PT)    Recommendations for Other Services       Precautions / Restrictions Precautions Precautions: Fall Precaution Comments: Abdominial incision, sacral wound, and recently has been leaking copious amounts of blood from her vagina?  Use her pullups that are in the room.  Required Braces or Orthoses: Other Brace/Splint Other Brace/Splint: JP drain Restrictions Weight Bearing Restrictions: No    Mobility  Bed Mobility Overal bed mobility: Needs Assistance Bed Mobility: Rolling;Supine to Sit;Sit to Supine Rolling: Min guard;Min assist Sidelying to sit: Min assist Supine to sit: Min assist Sit to supine: Min assist      Transfers Overall transfer level: Needs assistance Equipment used: Rolling walker (2 wheeled) Transfers: Sit to/from Stand Sit to Stand: Min assist         General transfer comment: Min assist to power up and steady  Ambulation/Gait Ambulation/Gait assistance: Min assist Ambulation Distance (Feet): 28 Feet Assistive  device: 1 person hand held assist;Rolling walker (2 wheeled) Gait Pattern/deviations: Step-through pattern;Shuffle;Staggering left;Staggering right Gait velocity: reduced Gait velocity interpretation: Below normal speed for age/gender     Stairs            Wheelchair Mobility    Modified Rankin (Stroke Patients Only)       Balance     Sitting balance-Leahy Scale: Good     Standing balance support: Single extremity supported;Bilateral upper extremity supported Standing balance-Leahy Scale: Poor Standing balance comment: pt confused with walker and tries to let go of it                            Cognition Arousal/Alertness: Awake/alert Behavior During Therapy: Impulsive Overall Cognitive Status: Impaired/Different from baseline Area of Impairment: Orientation;Attention;Memory;Following commands;Safety/judgement;Awareness;Problem solving                 Orientation Level: Situation;Time Current Attention Level: Selective Memory: Decreased recall of precautions;Decreased short-term memory Following Commands: Follows one step commands inconsistently;Follows one step commands with increased time Safety/Judgement: Decreased awareness of deficits;Decreased awareness of safety Awareness: Intellectual Problem Solving: Slow processing;Difficulty sequencing;Requires verbal cues        Exercises      General Comments        Pertinent Vitals/Pain Pain Assessment: No/denies pain Faces Pain Scale: No hurt    Home Living                      Prior Function  PT Goals (current goals can now be found in the care plan section) Acute Rehab PT Goals Patient Stated Goal: none Progress towards PT goals: Progressing toward goals    Frequency    Min 2X/week      PT Plan Current plan remains appropriate    Co-evaluation              AM-PAC PT "6 Clicks" Daily Activity  Outcome Measure  Difficulty turning over in bed  (including adjusting bedclothes, sheets and blankets)?: A Little Difficulty moving from lying on back to sitting on the side of the bed? : Unable Difficulty sitting down on and standing up from a chair with arms (e.g., wheelchair, bedside commode, etc,.)?: Unable Help needed moving to and from a bed to chair (including a wheelchair)?: A Little Help needed walking in hospital room?: A Little Help needed climbing 3-5 steps with a railing? : A Little 6 Click Score: 14    End of Session Equipment Utilized During Treatment: Gait belt Activity Tolerance: Patient limited by fatigue;Treatment limited secondary to medical complications (Comment) Patient left: in bed;with call bell/phone within reach;with bed alarm set Nurse Communication: Mobility status PT Visit Diagnosis: Other abnormalities of gait and mobility (R26.89);Muscle weakness (generalized) (M62.81)     Time: 1610-9604 PT Time Calculation (min) (ACUTE ONLY): 32 min  Charges:  $Gait Training: 8-22 mins $Therapeutic Activity: 8-22 mins                    G Codes:  Functional Assessment Tool Used: AM-PAC 6 Clicks Basic Mobility    Ivar Drape 01/17/2018, 4:42 PM   Samul Dada, PT MS Acute Rehab Dept. Number: [facility] [id number] and [facility] [phone]

## 2018-01-17 NOTE — Procedures (Signed)
Interventional Radiology Procedure Note  Procedure: Placement of a right IJ approach double lumen tunneled catheter. 20cm. Tip is positioned at the superior cavoatrial junction and catheter is ready for immediate use.  Complications: None Recommendations:  - Ok to use - Do not submerge   - Routine care   Signed,  Dulcy Fanny. Earleen Newport, DO

## 2018-01-17 NOTE — Social Work (Signed)
CSW spoke with pt niece at bedside Nita Sells (wife of Brien Mates). Pt family is trying to understand the care that would be needed at discharge and if they would like for her to return to SNF or if they are going to take her home.   Pt niece states that the family has left notes by sink in room if MD could take a look regarding some care questions they have so that they are more understanding about the level of care and whether or not they could manage at home.   CSW provided active listening and support for challenge of making discharge decision.   CSW will alert RN Case Manager that family is deciding between home and SNF. CSW will also page MD regarding pt family questions.   CSW aware that pt is still requiring medical attention and is following to provide support for pt discharge when medically appropriate.    Alexander Mt, [employer] Work [phone]

## 2018-01-17 NOTE — Progress Notes (Signed)
Pt to transfer to IR, pt alert and oriented.

## 2018-01-17 NOTE — Progress Notes (Signed)
Pt transfer to CT. 

## 2018-01-17 NOTE — Progress Notes (Signed)
Pt cbg was 61 alert and oriented asymptomatic, pt on D5W at 50cc/hr , MD aware rechecked CBG 135, TPN started .

## 2018-01-18 ENCOUNTER — Inpatient Hospital Stay (HOSPITAL_COMMUNITY): Payer: Medicare Other

## 2018-01-18 ENCOUNTER — Encounter (HOSPITAL_COMMUNITY): Payer: Self-pay | Admitting: Interventional Radiology

## 2018-01-18 DIAGNOSIS — C7A098 Malignant carcinoid tumors of other sites: Secondary | ICD-10-CM

## 2018-01-18 DIAGNOSIS — R1312 Dysphagia, oropharyngeal phase: Secondary | ICD-10-CM

## 2018-01-18 DIAGNOSIS — M810 Age-related osteoporosis without current pathological fracture: Secondary | ICD-10-CM

## 2018-01-18 DIAGNOSIS — G629 Polyneuropathy, unspecified: Secondary | ICD-10-CM

## 2018-01-18 DIAGNOSIS — Z789 Other specified health status: Secondary | ICD-10-CM

## 2018-01-18 DIAGNOSIS — E11649 Type 2 diabetes mellitus with hypoglycemia without coma: Secondary | ICD-10-CM

## 2018-01-18 DIAGNOSIS — K651 Peritoneal abscess: Secondary | ICD-10-CM

## 2018-01-18 DIAGNOSIS — Z4659 Encounter for fitting and adjustment of other gastrointestinal appliance and device: Secondary | ICD-10-CM

## 2018-01-18 DIAGNOSIS — E1143 Type 2 diabetes mellitus with diabetic autonomic (poly)neuropathy: Secondary | ICD-10-CM

## 2018-01-18 DIAGNOSIS — E119 Type 2 diabetes mellitus without complications: Secondary | ICD-10-CM

## 2018-01-18 DIAGNOSIS — E43 Unspecified severe protein-calorie malnutrition: Secondary | ICD-10-CM

## 2018-01-18 DIAGNOSIS — E785 Hyperlipidemia, unspecified: Secondary | ICD-10-CM

## 2018-01-18 DIAGNOSIS — K56609 Unspecified intestinal obstruction, unspecified as to partial versus complete obstruction: Secondary | ICD-10-CM

## 2018-01-18 DIAGNOSIS — I10 Essential (primary) hypertension: Secondary | ICD-10-CM

## 2018-01-18 DIAGNOSIS — Z79899 Other long term (current) drug therapy: Secondary | ICD-10-CM

## 2018-01-18 DIAGNOSIS — R0602 Shortness of breath: Secondary | ICD-10-CM

## 2018-01-18 DIAGNOSIS — R634 Abnormal weight loss: Secondary | ICD-10-CM

## 2018-01-18 DIAGNOSIS — Z7982 Long term (current) use of aspirin: Secondary | ICD-10-CM

## 2018-01-18 DIAGNOSIS — K769 Liver disease, unspecified: Secondary | ICD-10-CM

## 2018-01-18 DIAGNOSIS — R410 Disorientation, unspecified: Secondary | ICD-10-CM

## 2018-01-18 DIAGNOSIS — I82411 Acute embolism and thrombosis of right femoral vein: Secondary | ICD-10-CM

## 2018-01-18 DIAGNOSIS — Z0189 Encounter for other specified special examinations: Secondary | ICD-10-CM

## 2018-01-18 HISTORY — PX: IR SINUS/FIST TUBE CHK-NON GI: IMG673

## 2018-01-18 LAB — GLUCOSE, CAPILLARY
GLUCOSE-CAPILLARY: 138 mg/dL — AB (ref 65–99)
GLUCOSE-CAPILLARY: 75 mg/dL (ref 65–99)
Glucose-Capillary: 134 mg/dL — ABNORMAL HIGH (ref 65–99)
Glucose-Capillary: 140 mg/dL — ABNORMAL HIGH (ref 65–99)
Glucose-Capillary: 181 mg/dL — ABNORMAL HIGH (ref 65–99)
Glucose-Capillary: 217 mg/dL — ABNORMAL HIGH (ref 65–99)
Glucose-Capillary: 94 mg/dL (ref 65–99)

## 2018-01-18 LAB — BASIC METABOLIC PANEL
ANION GAP: 9 (ref 5–15)
BUN: 25 mg/dL — ABNORMAL HIGH (ref 6–20)
CALCIUM: 8.1 mg/dL — AB (ref 8.9–10.3)
CO2: 25 mmol/L (ref 22–32)
Chloride: 110 mmol/L (ref 101–111)
Creatinine, Ser: 1.35 mg/dL — ABNORMAL HIGH (ref 0.44–1.00)
GFR calc Af Amer: 41 mL/min — ABNORMAL LOW (ref >60)
GFR, EST NON AFRICAN AMERICAN: 35 mL/min — AB (ref >60)
GLUCOSE: 151 mg/dL — AB (ref 65–99)
POTASSIUM: 3.5 mmol/L (ref 3.5–5.1)
SODIUM: 144 mmol/L (ref 135–145)

## 2018-01-18 LAB — MAGNESIUM: Magnesium: 1.9 mg/dL (ref 1.7–2.4)

## 2018-01-18 LAB — PHOSPHORUS: Phosphorus: 2.8 mg/dL (ref 2.5–4.6)

## 2018-01-18 MED ORDER — IOPAMIDOL (ISOVUE-300) INJECTION 61%: 10.0000 mL | Freq: Once | INTRAVENOUS | Status: DC | PRN

## 2018-01-18 MED ORDER — TRAVASOL 10 % IV SOLN
INTRAVENOUS | Status: AC
  Administered 2018-01-18: 17:00:00 via INTRAVENOUS
  Filled 2018-01-18: qty 904.8

## 2018-01-18 MED ORDER — IOPAMIDOL (ISOVUE-300) INJECTION 61%
INTRAVENOUS | Status: AC
  Filled 2018-01-18: qty 50

## 2018-01-18 MED ORDER — INSULIN ASPART 100 UNIT/ML ~~LOC~~ SOLN: 0.0000 [IU] | SUBCUTANEOUS | Status: DC

## 2018-01-18 NOTE — Consult Note (Signed)
[facility] Medical Consultation  Theresa Howe UJW:[id number] DOB: [birth year] DOA: 12/24/2017 PCP: [name], MD   Requesting physician: Donell Beers, MD  Date of consultation:01/17/2018  Reason for consultation: generalized weakness, nausea, vomiting, failure to thrive  Impression/Recommendations Principal Problem:   Failure to thrive (0-17) Active Problems:   Diabetes with neurologic complications (HCC)   HTN (hypertension)   Glaucoma   Protein-calorie malnutrition, severe   Carcinomatosis (HCC)    Failure to thrive -  -multifactorial, possibly 2/2 intrabdominal infection, malignancy, debility of severe chronic illness -head CT no acute intracranial abnormalities -oncology consulted and is following  DM type II with hypoglycemia- -A1C 7.1 (11/28/17)  -hold SSI while on D5w -avoid hypoglycemia -continue D5w at 50cc/hr  AKI -baseline cr 0.78 -cr 1.35 -gentle IV hydration -avoid nephrotoxic meds/hypotension/dehydration  Suspected dysphagia -speech therapist consulted -NPO until passes swallow eval  HTN -  -BP stable -IV Hydralazine prn    Hypernatremia and hypokalemia, resolved-  -Na+ 144 from 150 -K+ 3.5 from 3.0  Glaucoma - -continue home drops, Xalatan and Trusopt  Thank you for allowing [place] to participate in the care of this patient. We will continue to follow up with you. Please call if you have any questions.    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 She underwent diagnostic laparoscopy and laparotomy with small bowel resection twice- on 12/04/2017 and 12/27/2017.  Patient was discharged to skilled nursing facility for rehabilitation after his first surgery, however during her stay there she developed multiple episodes of  emesis and consequently became severely dehydrated. She was readmitted on 12/27/2017 and in the hospital since then.   On 01/09/2018 she was diagnosed with the right lower quadrant abdominal abscess and underwent placement of the drain and was treated with broad-spectrum antibiotic-Zosyn.  Nevertheless patient remained, very weak unable to maintain oral intake due to persistent vomiting and difficulty swallowing.  She is now on IV hydration and TNA.  Subjective:  01/18/18: Patient seen and examined at her bedside. She is alert but confused. Does not know where she is, the time or circumstances. Denies any pain or dyspnea. No new complaints. Unable to obtain ROS due to confusion. No focal deficits. CT head wo contract no acute intracranial abnormalities.    Past Medical History:  Diagnosis Date  . Abdominal mass 10/2017   SMALL BOWEL  . Diabetes mellitus    type 2  . Dyspnea   . Failure to thrive (0-17) 12/24/2017  . Glaucoma   . Hyperlipidemia   . Hypertension   . Neuropathy   . Osteoporosis   . Wheezing    Past Surgical History:  Procedure Laterality Date  . ABDOMINAL HYSTERECTOMY    . CATARACT EXTRACTION Right 01/2010  . COLON SURGERY    . COLONOSCOPY    . EUS N/A 04/17/2013   Procedure: UPPER ENDOSCOPIC ULTRASOUND (EUS) LINEAR;  Surgeon: Rachael Fee, MD;  Location: [facility] END[facility]COPY;  Service: Endoscopy;  Laterality: N/A;  . GLAUCOMA SURGERY  2007  . IR FLUORO GUIDE CV LINE RIGHT  01/17/2018  . IR IMAGE GUIDED DRAINAGE PERCUT CATH  PERITONEAL RETROPERIT  01/09/2018  . IR SINUS/FIST TUBE CHK-NON GI  01/18/2018  . IR US GUIDE VASC ACCESS RIGHT  01/17/2018  . LAPAROSCOPIC SMALL BOWEL RESECTION  12/04/2017  . LAPAROSCOPIC SMALL BOWEL RESECTION N/A 12/04/2017   Procedure:  LAPAROSCOPIC SMALL BOWEL RESECTION;  Surgeon: Almond Lint, MD;  Location: [facility] OR;  Service: General;  Laterality: N/A;  . LAPAROSCOPY N/A 12/04/2017   Procedure: LAPAROSCOPY DIAGNOSTIC;  Surgeon: Almond Lint, MD;   Location: [facility] OR;  Service: General;  Laterality: N/A;  . LAPAROSCOPY N/A 12/27/2017   Procedure: DIAGNOSTIC LAPAROSCOPY/ LAPAROTOMY WITH SMALL BOWEL RESECTION TIMES TWO;  Surgeon: Almond Lint, MD;  Location: [facility] OR;  Service: General;  Laterality: N/A;  . MULTIPLE TOOTH EXTRACTIONS    . PICC LINE INSERTION  12/24/2017   Social History:  reports that  has never smoked. she has never used smokeless tobacco. She reports that she does not drink alcohol or use drugs.  No Known Allergies History reviewed. No pertinent family history.  Prior to Admission medications   Medication Sig Start Date End Date Taking? Authorizing Provider  acetaminophen (TYLENOL) 325 MG tablet Take 2 tablets (650 mg total) by mouth every 6 (six) hours as needed for mild pain (or Fever >/= 101). 12/13/17  Yes Sherrie George, PA-C  albuterol [facility] HFA) 108 (90 Base) MCG/ACT inhaler Inhale 2 puffs into the lungs every 6 (six) hours as needed for wheezing or shortness of breath. 12/23/15  Yes [place], Velna Hatchet, MD  aspirin 81 MG tablet Take 81 mg by mouth daily.     Yes [provider]  Calcium Carbonate-Vitamin D (CALCIUM 600 + D PO) Take 1 tablet by mouth daily.    Yes [provider]  collagenase (SANTYL) ointment Apply 1 application topically daily. Left buttock   Yes [provider]  docusate sodium (COLACE) 100 MG capsule Take 1 capsule (100 mg total) by mouth 2 (two) times daily. 12/13/17  Yes Sherrie George, PA-C  dorzolamide (TRUSOPT) 2 % ophthalmic solution Place 1 drop into both eyes 2 (two) times daily. 06/02/16  Yes [place], Velna Hatchet, MD  escitalopram (LEXAPRO) 10 MG tablet TAKE 1 TABLET AT BEDTIME Patient taking differently: TAKE 5mg  AT BEDTIME 12/03/17  Yes [place], Velna Hatchet, MD  feeding supplement, ENSURE ENLIVE, (ENSURE ENLIVE) LIQD Take 237 mLs by mouth 3 (three) times daily between meals. 12/13/17  Yes Sherrie George, PA-C  fluticasone [facility]) 50 MCG/ACT nasal spray USE 2  SPRAYS IN EACH       NOSTRIL DAILY 12/24/17  Yes [place], Velna Hatchet, MD  gabapentin (NEURONTIN) 100 MG capsule TAKE 1 TO 2 CAPSULES AT    BEDTIME FOR NEUROPATHY Patient taking differently: Take 100 mg by mouth at bedtime.  12/03/17  Yes [place], Velna Hatchet, MD  latanoprost (XALATAN) 0.005 % ophthalmic solution Place 1 drop into both eyes at bedtime. 10/01/17  Yes [place], Velna Hatchet, MD  losartan-hydrochlorothiazide (HYZAAR) 50-12.5 MG tablet Take 1 tablet daily by mouth. 10/25/17  Yes [place], Velna Hatchet, MD  metFORMIN (GLUCOPHAGE) 1000 MG tablet Take 0.5 tablets (500 mg total) by mouth 2 (two) times daily with a meal. 12/26/16  Yes [place], Velna Hatchet, MD  mirtazapine (REMERON) 15 MG tablet Take 0.5 tablets (7.5 mg total) by mouth at bedtime. For appetite 10/04/17  Yes , Velna Hatchet, MD  Multiple Vitamins-Minerals (CENTRUM SILVER PO) Take 1 tablet by mouth daily.    Yes [provider]  omeprazole (PRILOSEC) 40 MG capsule Take 40 mg by mouth daily.   Yes [provider]  ondansetron (ZOFRAN) 4 MG tablet Take 4 mg by mouth every 6 (six) hours as needed for nausea or vomiting.   Yes [provider]  oxyCODONE (ROXICODONE) 5 MG/5ML solution Take 5 mLs (  5 mg total) by mouth every 6 (six) hours. Patient taking differently: Take 5 mg by mouth every 6 (six) hours as needed for severe pain.  12/13/17  Yes Sherrie George, PA-C  polyethylene glycol [facility] GLYCOLAX) packet Take 17 g by mouth daily as needed for moderate constipation. 12/13/17  Yes Sherrie George, PA-C  alendronate (FOSAMAX) 70 MG tablet TAKE 1 TABLET EVERY 7 DAYS WITH A FULL GLASS OF WATER ON AN EMPTY STOMACH 12/25/17   [name], MD  ONE [facility] ULTRA TEST test strip TEST FASTING BLOOD SUGAR ONCE DAILY **DX E11.4 09/10/17   [name], MD   Physical Exam: Blood pressure (!) 152/73, pulse 88, temperature (!) 97.5 F (36.4 C), temperature source Oral, resp. rate 16, height 5\' 5"  (1.651 m), weight 54 kg  (119 lb 0.8 oz), SpO2 100 %. Vitals:   01/17/18 2148 01/18/18 0410  BP: (!) 144/65 (!) 152/73  Pulse: 90 88  Resp: 17 16  Temp: 98.2 F (36.8 C) (!) 97.5 F (36.4 C)  SpO2: 100% 100%     General:  Very thin and frail looking AAF in NAD  Eyes: PERRLA, no discharge, conjunctivae unicteric  ENT: normal, no lesions, mucous membranes are intact  Neck: supple, no lymphadenopathy, no thyromegaly, no masses palpated  Cardiovascular: RRR, no R/M/G  Respiratory: diminished BS, CTA bilaterally, no use of accessory muscles observed  Abdomen: flar, NT, ND, poorly audible BS; drain in place with serosanguinous fluid. Surgical dressing in mid abdomen present.  Skin: warm, dray, no lesions or rashes on limited exam, turgor diminished  Musculoskeletal: non focal  Psychiatric:unable to assess due to confusion  Neurologic: unable to assess as the patient does not follow commands  Labs on Admission:  Basic Metabolic Panel: Recent Labs  Lab 01/14/18 0425 01/15/18 0415 01/15/18 0855 01/16/18 0444 01/17/18 0647 01/18/18 0429  NA 148* 146* 149* 150* 150* 144  K 3.7 5.9* 3.9 3.6 3.0* 3.5  CL 110 111 113* 112* 115* 110  CO2 25 23 23 25 22 25   GLUCOSE 185* 497* 207* 98 127* 151*  BUN 25* 30* 33* 34* 28* 25*  CREATININE 0.81 1.06* 1.13* 1.37* 1.20* 1.35*  CALCIUM 8.9 8.6* 8.9 8.8* 8.5* 8.1*  MG 1.9  --   --  2.3 1.9 1.9  PHOS 3.6  --   --  3.5 3.3 2.8   Liver Function Tests: Recent Labs  Lab 01/14/18 0425 01/16/18 0444 01/17/18 0647  AST 31 33 25  ALT 38 40 31  ALKPHOS 186* 162* 134*  BILITOT 0.5 0.6 0.7  PROT 6.1* 6.3* 6.0*  ALBUMIN 1.9* 2.0* 1.9*   CBC: Recent Labs  Lab 01/13/18 0406 01/13/18 1756 01/14/18 0425 01/15/18 0415 01/17/18 0647  WBC 8.7 8.0 9.1 13.7* 8.1  NEUTROABS  --   --  6.3  --   --   HGB 9.0* 8.9* 8.9* 8.6* 8.4*  HCT 29.4* 28.9* 29.6* 28.8* 28.8*  MCV 85.7 86.5 86.5 89.7 89.4  PLT 991* 885* 915* 915* 746*   CBG: Recent Labs  Lab  01/17/18 2014 01/18/18 0032 01/18/18 0409 01/18/18 0745 01/18/18 1221  GLUCAP 204* 75 134* 140* 94    Radiological Exams on Admission: Ct Head Wo Contrast  Result Date: 01/17/2018 CLINICAL DATA:  Neuroendocrine carcinomatosis, carcinoid syndrome. EXAM: CT HEAD WITHOUT CONTRAST TECHNIQUE: Contiguous axial images were obtained from the base of the skull through the vertex without intravenous contrast. COMPARISON:  CT HEAD April 14, 2015 FINDINGS: BRAIN: No intraparenchymal  hemorrhage, mass effect nor midline shift. Moderate to severe parenchymal brain volume loss, progressed from 2016. No hydrocephalus. Patchy supratentorial white matter hypodensities less than expected for patient's age, though non-specific are most compatible with chronic small vessel ischemic disease. No acute large vascular territory infarcts. No abnormal extra-axial fluid collections. Basal cisterns are patent. VASCULAR: Moderate to severe calcific atherosclerosis of the carotid siphons. SKULL: No skull fracture. No significant scalp soft tissue swelling. SINUSES/ORBITS: The mastoid air-cells and included paranasal sinuses are well-aerated.The included ocular globes and orbital contents are non-suspicious. Status post RIGHT ocular lens implant. OTHER: None. IMPRESSION: 1. No acute intracranial process. 2. Moderate to severe parenchymal brain volume loss and mild chronic small vessel ischemic disease. Electronically Signed   By: Awilda Metro M.D.   On: 01/17/2018 19:14   Ir Sinus/fist Tube Chk-non Gi  Result Date: 01/18/2018 CLINICAL DATA:  History of intestinal surgery complicated by postoperative fluid collection post ultrasound-guided drainage catheter placement on 01/09/2018 Postprocedural CT scan of the abdomen pelvis demonstrates significant reduction in the size of the residual lower abdominal/pelvic fluid collection and as such, request made for drainage catheter injection prior to potential removal. The drainage  catheter continues to put out approximately 30-50 cc of blood tinged fluid per day. EXAM: SINUS TRACT INJECTION/FISTULOGRAM COMPARISON:  CT abdomen and pelvis - 01/16/2018; 01/07/2018; ultrasound-guided drainage catheter placement-01/09/2018 CONTRAST:  10 cc Isovue-300 FLUOROSCOPY TIME:  42 seconds TECHNIQUE: The patient was positioned supine on the fluoroscopy table. A preprocedural spot fluoroscopic image was obtained of the right lower abdomen/pelvis and existing percutaneous drainage catheter Multiple spot fluoroscopic and radiographic images were obtained following the injection of a small amount of contrast via the existing percutaneous drainage catheter. Multiple spot fluoroscopic and radiographic images were obtained from the injection of a small amount of contrast via the existing percutaneous drainage catheter Images reviewed and discussed with referring surgeon, Dr. Donell Beers and the decision was made to maintain the drainage catheter in place at this time. As such, the drainage catheter was flushed with a small amount of saline and reconnected to a JP bulb. FINDINGS: Preprocedural spot fluoroscopic image demonstrates unchanged positioning of the percutaneous drainage catheter with end coiled and locked over the right hemipelvis. Contrast injection demonstrates opacification of a moderate-sized residual decompressed abscess cavity without definitive fistulous communication to adjacent intestines. There is minimal reflux of contrast along the catheter tract to the entrance site at the skin surface. IMPRESSION: 1. Appropriately positioned and functioning percutaneous drainage catheter. 2. Opacification of a moderate-sized residual decompressed abscess cavity without definitive fistulous connection. PLAN: - Per my discussion with Dr. Donell Beers, given persistent output from the moderate-sized decompressed abscess cavity, the decision was made to maintain the drainage catheter in place at this time. - Orders were  placed to no longer flush the percutaneous drainage catheter but to maintain diligent records regarding daily drainage catheter output. - If and when the output has reduced to less than 10 cc per day the drainage catheter could be removed at that time either with or without repeat fluoroscopic guided injection. Electronically Signed   By: Simonne Come M.D.   On: 01/18/2018 14:33   Ir Fluoro Guide Cv Line Right  Result Date: 01/17/2018 INDICATION: 84 year old female with a history bowel resection, poor nutrition, need for parental nutrition EXAM: IMAGE GUIDED TUNNELED CENTRAL CATHETER MEDICATIONS: None ANESTHESIA/SEDATION: Moderate (conscious) sedation was employed during this procedure. A total of Versed 1.0 mg and Fentanyl 50 mcg was administered intravenously. Moderate Sedation Time: 13 minutes.  The patient's level of consciousness and vital signs were monitored continuously by radiology nursing throughout the procedure under my direct supervision. FLUOROSCOPY TIME:  Fluoroscopy Time: 0 minutes 6 seconds (0 mGy). COMPLICATIONS: None PROCEDURE: After written informed consent was obtained, patient was placed in the supine position on angiographic table. Patency of the right internal jugular vein was confirmed with ultrasound with image documentation. Patient was prepped and draped in the usual sterile fashion including the right neck and right superior chest. Using ultrasound guidance, the skin and subcutaneous tissues overlying the right internal jugular vein were generously infiltrated with 1% lidocaine without epinephrine. Using ultrasound guidance, the right internal jugular vein was punctured with a micropuncture needle, and an 018 wire was advanced into the right heart confirming venous access. A small stab incision was made with an 11 blade scalpel. Peel-away sheath was placed over the wire, and then the wire was removed, marking the wire for estimation of internal catheter length. The chest wall was then  generously infiltrated with 1% lidocaine for local anesthesia along the tissue tract. Small stab incision was made with 11 blade scalpel, and then the catheter was back tunneled to the puncture site at the right internal jugular vein. Catheter was pulled through the tract, with the catheter amputated at 20 cm. Catheter was advanced through the peel-away sheath, and the peel-away sheath was removed. Final image was stored. The catheter was anchored to the chest wall with 2 retention sutures, and Derma bond was used to seal the right internal jugular vein incision site and at the right chest wall. Patient tolerated the procedure well and remained hemodynamically stable throughout. No complications were encountered and no significant blood loss was encountered. IMPRESSION: Status post right IJ tunneled central catheter. Signed, Yvone Neu. Loreta Ave, DO [facility] Radiology Electronically Signed   By: Gilmer Mor D.O.   On: 01/17/2018 09:45   Ir US Guide Vasc Access Right  Result Date: 01/17/2018 INDICATION: 84 year old female with a history bowel resection, poor nutrition, need for parental nutrition EXAM: IMAGE GUIDED TUNNELED CENTRAL CATHETER MEDICATIONS: None ANESTHESIA/SEDATION: Moderate (conscious) sedation was employed during this procedure. A total of Versed 1.0 mg and Fentanyl 50 mcg was administered intravenously. Moderate Sedation Time: 13 minutes. The patient's level of consciousness and vital signs were monitored continuously by radiology nursing throughout the procedure under my direct supervision. FLUOROSCOPY TIME:  Fluoroscopy Time: 0 minutes 6 seconds (0 mGy). COMPLICATIONS: None PROCEDURE: After written informed consent was obtained, patient was placed in the supine position on angiographic table. Patency of the right internal jugular vein was confirmed with ultrasound with image documentation. Patient was prepped and draped in the usual sterile  fashion including the right neck and right superior chest. Using ultrasound guidance, the skin and subcutaneous tissues overlying the right internal jugular vein were generously infiltrated with 1% lidocaine without epinephrine. Using ultrasound guidance, the right internal jugular vein was punctured with a micropuncture needle, and an 018 wire was advanced into the right heart confirming venous access. A small stab incision was made with an 11 blade scalpel. Peel-away sheath was placed over the wire, and then the wire was removed, marking the wire for estimation of internal catheter length. The chest wall was then generously infiltrated with 1% lidocaine for local anesthesia along the tissue tract. Small stab incision was made with 11 blade scalpel, and then the catheter was back tunneled to the puncture site at the right internal jugular vein. Catheter was pulled through  the tract, with the catheter amputated at 20 cm. Catheter was advanced through the peel-away sheath, and the peel-away sheath was removed. Final image was stored. The catheter was anchored to the chest wall with 2 retention sutures, and Derma bond was used to seal the right internal jugular vein incision site and at the right chest wall. Patient tolerated the procedure well and remained hemodynamically stable throughout. No complications were encountered and no significant blood loss was encountered. IMPRESSION: Status post right IJ tunneled central catheter. Signed, Yvone Neu. Loreta Ave, DO [facility] Radiology Electronically Signed   By: Gilmer Mor D.O.   On: 01/17/2018 09:45    EKG: normal tracing on EKG from12/12/18 Time spent: 46  Darlin Drop, MD [facility] Pager [phone]  If 7PM-7AM, please contact night-coverage [email or web address] Password [facility] 01/18/2018, 2:53 PM

## 2018-01-18 NOTE — Care Management Important Message (Signed)
Important Message  Patient Details  Name: Theresa Howe MRN: [id number] Date of Birth: May 23, 1931   Medicare Important Message Given:  Yes    Carles Collet, RN 01/18/2018, 10:46 AM

## 2018-01-18 NOTE — Progress Notes (Signed)
22 Days Post-Op   Subjective/Chief Complaint: Still confused. Still refusing to do things like therapy and speech eval.       Objective: Vital signs in last 24 hours: Temp:  [97.5 F (36.4 C)-98.2 F (36.8 C)] 97.5 F (36.4 C) (02/01 0410) Pulse Rate:  [88-90] 88 (02/01 0410) Resp:  [16-17] 16 (02/01 0410) BP: (144-152)/(65-73) 152/73 (02/01 0410) SpO2:  [100 %] 100 % (02/01 0410) Last BM Date: 01/17/18  Intake/Output from previous day: 01/31 0701 - 02/01 0700 In: 1846.2 [I.V.:1746.2; IV Piggyback:100] Out: 2.5 [Drains:2.5] Intake/Output this shift: No intake/output data recorded.  General appearance: remains  confused  Resp: breathing comfortably GI: soft, non-tender; RLQ drain with bloody drainage around insertion site.  Drain output serous.  Bloody urine.     Lab Results:  Recent Labs    01/17/18 0647  WBC 8.1  HGB 8.4*  HCT 28.8*  PLT 746*   BMET Recent Labs    01/17/18 0647 01/18/18 0429  NA 150* 144  K 3.0* 3.5  CL 115* 110  CO2 22 25  GLUCOSE 127* 151*  BUN 28* 25*  CREATININE 1.20* 1.35*  CALCIUM 8.5* 8.1*   PT/INR Recent Labs    01/17/18 0718  LABPROT 15.6*  INR 1.25   ABG No results for input(s): PHART, HCO3 in the last 72 hours.  Invalid input(s): PCO2, PO2  Studies/Results: Ct Head Wo Contrast  Result Date: 01/17/2018 CLINICAL DATA:  Neuroendocrine carcinomatosis, carcinoid syndrome. EXAM: CT HEAD WITHOUT CONTRAST TECHNIQUE: Contiguous axial images were obtained from the base of the skull through the vertex without intravenous contrast. COMPARISON:  CT HEAD April 14, 2015 FINDINGS: BRAIN: No intraparenchymal hemorrhage, mass effect nor midline shift. Moderate to severe parenchymal brain volume loss, progressed from 2016. No hydrocephalus. Patchy supratentorial white matter hypodensities less than expected for patient's age, though non-specific are most compatible with chronic small vessel ischemic disease. No acute large vascular  territory infarcts. No abnormal extra-axial fluid collections. Basal cisterns are patent. VASCULAR: Moderate to severe calcific atherosclerosis of the carotid siphons. SKULL: No skull fracture. No significant scalp soft tissue swelling. SINUSES/ORBITS: The mastoid air-cells and included paranasal sinuses are well-aerated.The included ocular globes and orbital contents are non-suspicious. Status post RIGHT ocular lens implant. OTHER: None. IMPRESSION: 1. No acute intracranial process. 2. Moderate to severe parenchymal brain volume loss and mild chronic small vessel ischemic disease. Electronically Signed   By: Elon Alas M.D.   On: 01/17/2018 19:14   Ir Sinus/fist Tube Chk-non Gi  Result Date: 01/18/2018 CLINICAL DATA:  History of intestinal surgery complicated by postoperative fluid collection post ultrasound-guided drainage catheter placement on 01/09/2018 Postprocedural CT scan of the abdomen pelvis demonstrates significant reduction in the size of the residual lower abdominal/pelvic fluid collection and as such, request made for drainage catheter injection prior to potential removal. The drainage catheter continues to put out approximately 30-50 cc of blood tinged fluid per day. EXAM: SINUS TRACT INJECTION/FISTULOGRAM COMPARISON:  CT abdomen and pelvis - 01/16/2018; 01/07/2018; ultrasound-guided drainage catheter placement-01/09/2018 CONTRAST:  10 cc Isovue-300 FLUOROSCOPY TIME:  42 seconds TECHNIQUE: The patient was positioned supine on the fluoroscopy table. A preprocedural spot fluoroscopic image was obtained of the right lower abdomen/pelvis and existing percutaneous drainage catheter Multiple spot fluoroscopic and radiographic images were obtained following the injection of a small amount of contrast via the existing percutaneous drainage catheter. Multiple spot fluoroscopic and radiographic images were obtained from the injection of a small amount of contrast via the existing percutaneous  drainage  catheter Images reviewed and discussed with referring surgeon, Dr. Barry Dienes and the decision was made to maintain the drainage catheter in place at this time. As such, the drainage catheter was flushed with a small amount of saline and reconnected to a JP bulb. FINDINGS: Preprocedural spot fluoroscopic image demonstrates unchanged positioning of the percutaneous drainage catheter with end coiled and locked over the right hemipelvis. Contrast injection demonstrates opacification of a moderate-sized residual decompressed abscess cavity without definitive fistulous communication to adjacent intestines. There is minimal reflux of contrast along the catheter tract to the entrance site at the skin surface. IMPRESSION: 1. Appropriately positioned and functioning percutaneous drainage catheter. 2. Opacification of a moderate-sized residual decompressed abscess cavity without definitive fistulous connection. PLAN: - Per my discussion with Dr. Barry Dienes, given persistent output from the moderate-sized decompressed abscess cavity, the decision was made to maintain the drainage catheter in place at this time. - Orders were placed to no longer flush the percutaneous drainage catheter but to maintain diligent records regarding daily drainage catheter output. - If and when the output has reduced to less than 10 cc per day the drainage catheter could be removed at that time either with or without repeat fluoroscopic guided injection. Electronically Signed   By: Sandi Mariscal M.D.   On: 01/18/2018 14:33   Ir Fluoro Guide Cv Line Right  Result Date: 01/17/2018 INDICATION: 84 year old female with a history bowel resection, poor nutrition, need for parental nutrition EXAM: IMAGE GUIDED TUNNELED CENTRAL CATHETER MEDICATIONS: None ANESTHESIA/SEDATION: Moderate (conscious) sedation was employed during this procedure. A total of Versed 1.0 mg and Fentanyl 50 mcg was administered intravenously. Moderate Sedation Time: 13 minutes. The patient's  level of consciousness and vital signs were monitored continuously by radiology nursing throughout the procedure under my direct supervision. FLUOROSCOPY TIME:  Fluoroscopy Time: 0 minutes 6 seconds (0 mGy). COMPLICATIONS: None PROCEDURE: After written informed consent was obtained, patient was placed in the supine position on angiographic table. Patency of the right internal jugular vein was confirmed with ultrasound with image documentation. Patient was prepped and draped in the usual sterile fashion including the right neck and right superior chest. Using ultrasound guidance, the skin and subcutaneous tissues overlying the right internal jugular vein were generously infiltrated with 1% lidocaine without epinephrine. Using ultrasound guidance, the right internal jugular vein was punctured with a micropuncture needle, and an 018 wire was advanced into the right heart confirming venous access. A small stab incision was made with an 11 blade scalpel. Peel-away sheath was placed over the wire, and then the wire was removed, marking the wire for estimation of internal catheter length. The chest wall was then generously infiltrated with 1% lidocaine for local anesthesia along the tissue tract. Small stab incision was made with 11 blade scalpel, and then the catheter was back tunneled to the puncture site at the right internal jugular vein. Catheter was pulled through the tract, with the catheter amputated at 20 cm. Catheter was advanced through the peel-away sheath, and the peel-away sheath was removed. Final image was stored. The catheter was anchored to the chest wall with 2 retention sutures, and Derma bond was used to seal the right internal jugular vein incision site and at the right chest wall. Patient tolerated the procedure well and remained hemodynamically stable throughout. No complications were encountered and no significant blood loss was encountered. IMPRESSION: Status post right IJ tunneled central  catheter. Signed, Dulcy Fanny. Earleen Newport, DO [facility] Radiology Electronically  Signed   By: Corrie Mckusick D.O.   On: 01/17/2018 09:45   Ir US Guide Vasc Access Right  Result Date: 01/17/2018 INDICATION: 84 year old female with a history bowel resection, poor nutrition, need for parental nutrition EXAM: IMAGE GUIDED TUNNELED CENTRAL CATHETER MEDICATIONS: None ANESTHESIA/SEDATION: Moderate (conscious) sedation was employed during this procedure. A total of Versed 1.0 mg and Fentanyl 50 mcg was administered intravenously. Moderate Sedation Time: 13 minutes. The patient's level of consciousness and vital signs were monitored continuously by radiology nursing throughout the procedure under my direct supervision. FLUOROSCOPY TIME:  Fluoroscopy Time: 0 minutes 6 seconds (0 mGy). COMPLICATIONS: None PROCEDURE: After written informed consent was obtained, patient was placed in the supine position on angiographic table. Patency of the right internal jugular vein was confirmed with ultrasound with image documentation. Patient was prepped and draped in the usual sterile fashion including the right neck and right superior chest. Using ultrasound guidance, the skin and subcutaneous tissues overlying the right internal jugular vein were generously infiltrated with 1% lidocaine without epinephrine. Using ultrasound guidance, the right internal jugular vein was punctured with a micropuncture needle, and an 018 wire was advanced into the right heart confirming venous access. A small stab incision was made with an 11 blade scalpel. Peel-away sheath was placed over the wire, and then the wire was removed, marking the wire for estimation of internal catheter length. The chest wall was then generously infiltrated with 1% lidocaine for local anesthesia along the tissue tract. Small stab incision was made with 11 blade scalpel, and then the catheter was back tunneled to the puncture site at  the right internal jugular vein. Catheter was pulled through the tract, with the catheter amputated at 20 cm. Catheter was advanced through the peel-away sheath, and the peel-away sheath was removed. Final image was stored. The catheter was anchored to the chest wall with 2 retention sutures, and Derma bond was used to seal the right internal jugular vein incision site and at the right chest wall. Patient tolerated the procedure well and remained hemodynamically stable throughout. No complications were encountered and no significant blood loss was encountered. IMPRESSION: Status post right IJ tunneled central catheter. Signed, Dulcy Fanny. Earleen Newport, DO [facility] Radiology Electronically Signed   By: Corrie Mckusick D.O.   On: 01/17/2018 09:45    Anti-infectives: Anti-infectives (From admission, onward)   Start     Dose/Rate Route Frequency Ordered Stop   12/31/17 1300  piperacillin-tazobactam (ZOSYN) IVPB 3.375 g     3.375 g 12.5 mL/hr over 240 Minutes Intravenous Every 8 hours 12/31/17 1252        Assessment/Plan: s/p Procedure(s): DIAGNOSTIC LAPAROSCOPY/ LAPAROTOMY WITH SMALL BOWEL RESECTION TIMES TWO (N/A)   Drain for fluid collection. multiple organisms, none predominant.  Continue antibiotics for 14 days total.  D/c'd antibiotics.   Repeat CT with near resolution of other abscess. ? Tumor nodules vs small abscesses.   DVT on prophylactic dose lovenox, once HCT more stable, will change back to therapeutic dosing. Continue TPN - calorie counts are pretty much zero.  Prealbumin came up.    Mobilize with tx as tolerated.   Medicine consult appreciated.   Discussed with nephew Theresa Howe.   Severe protein calorie malnutrition improving.    LOS: 25 days    Theresa Howe 01/18/2018

## 2018-01-18 NOTE — Progress Notes (Signed)
SLP Cancellation Note  Patient Details Name: Theresa Howe MRN: [id number] DOB: 02-26-1931   Cancelled treatment:        Pt refused po's with this SLP after much encouragement. Will continue efforts.   Houston Siren 01/18/2018, 10:29 AM   Orbie Pyo Colvin Caroli.Ed [employer] [phone]

## 2018-01-18 NOTE — Progress Notes (Signed)
Patient ID: Theresa Howe, female   DOB: 12/22/1930, 84 y.o.   MRN: [id number]    Referring Physician(s): Dr. Almond Lint  Supervising Physician: Simonne Come  Patient Status: [facility] - In-pt  Chief Complaint: Intra-abdominal abscess  Subjective: Patient is still confused and in mittens.  Denies abdominal pain currently.  Allergies: Patient has no known allergies.  Medications: Prior to Admission medications   Medication Sig Start Date End Date Taking? Authorizing Provider  acetaminophen (TYLENOL) 325 MG tablet Take 2 tablets (650 mg total) by mouth every 6 (six) hours as needed for mild pain (or Fever >/= 101). 12/13/17  Yes Sherrie George, PA-C  albuterol [facility] HFA) 108 (90 Base) MCG/ACT inhaler Inhale 2 puffs into the lungs every 6 (six) hours as needed for wheezing or shortness of breath. 12/23/15  Yes [place], Velna Hatchet, MD  aspirin 81 MG tablet Take 81 mg by mouth daily.     Yes [provider]  Calcium Carbonate-Vitamin D (CALCIUM 600 + D PO) Take 1 tablet by mouth daily.    Yes [provider]  collagenase (SANTYL) ointment Apply 1 application topically daily. Left buttock   Yes [provider]  docusate sodium (COLACE) 100 MG capsule Take 1 capsule (100 mg total) by mouth 2 (two) times daily. 12/13/17  Yes Sherrie George, PA-C  dorzolamide (TRUSOPT) 2 % ophthalmic solution Place 1 drop into both eyes 2 (two) times daily. 06/02/16  Yes [place], Velna Hatchet, MD  escitalopram (LEXAPRO) 10 MG tablet TAKE 1 TABLET AT BEDTIME Patient taking differently: TAKE 5mg  AT BEDTIME 12/03/17  Yes [place], Velna Hatchet, MD  feeding supplement, ENSURE ENLIVE, (ENSURE ENLIVE) LIQD Take 237 mLs by mouth 3 (three) times daily between meals. 12/13/17  Yes Sherrie George, PA-C  fluticasone [facility]) 50 MCG/ACT nasal spray USE 2 SPRAYS IN EACH       NOSTRIL DAILY 12/24/17  Yes [place], Velna Hatchet, MD  gabapentin (NEURONTIN) 100 MG capsule TAKE 1 TO 2 CAPSULES AT    BEDTIME FOR  NEUROPATHY Patient taking differently: Take 100 mg by mouth at bedtime.  12/03/17  Yes [place], Velna Hatchet, MD  latanoprost (XALATAN) 0.005 % ophthalmic solution Place 1 drop into both eyes at bedtime. 10/01/17  Yes [place], Velna Hatchet, MD  losartan-hydrochlorothiazide (HYZAAR) 50-12.5 MG tablet Take 1 tablet daily by mouth. 10/25/17  Yes [place], Velna Hatchet, MD  metFORMIN (GLUCOPHAGE) 1000 MG tablet Take 0.5 tablets (500 mg total) by mouth 2 (two) times daily with a meal. 12/26/16  Yes [place], Velna Hatchet, MD  mirtazapine (REMERON) 15 MG tablet Take 0.5 tablets (7.5 mg total) by mouth at bedtime. For appetite 10/04/17  Yes [place], Velna Hatchet, MD  Multiple Vitamins-Minerals (CENTRUM SILVER PO) Take 1 tablet by mouth daily.    Yes [provider]  omeprazole (PRILOSEC) 40 MG capsule Take 40 mg by mouth daily.   Yes [provider]  ondansetron (ZOFRAN) 4 MG tablet Take 4 mg by mouth every 6 (six) hours as needed for nausea or vomiting.   Yes [provider]  oxyCODONE (ROXICODONE) 5 MG/5ML solution Take 5 mLs (5 mg total) by mouth every 6 (six) hours. Patient taking differently: Take 5 mg by mouth every 6 (six) hours as needed for severe pain.  12/13/17  Yes Sherrie George, PA-C  polyethylene glycol [facility] / GLYCOLAX) packet Take 17 g by mouth daily as needed for moderate constipation. 12/13/17  Yes Sherrie George, PA-C  alendronate (FOSAMAX) 70 MG tablet TAKE 1 TABLET EVERY  7 DAYS WITH A FULL GLASS OF WATER ON AN EMPTY STOMACH 12/25/17   [name], MD  ONE TOUCH ULTRA TEST test strip TEST FASTING BLOOD SUGAR ONCE DAILY **DX E11.4 09/10/17   [name], MD    Vital Signs: BP (!) 152/73 (BP Location: Left Arm)   Pulse 88   Temp (!) 97.5 F (36.4 C) (Oral)   Resp 16   Ht 5\' 5"  (1.651 m) Comment: 12/04/17  Wt 119 lb 0.8 oz (54 kg)   SpO2 100%   BMI 19.81 kg/m   Physical Exam: Abd: soft, NT, cachectic, drain in place some serosang/bloody(old) output. 2.5cc  in last 24 hrs.  Drain site is c/d/i  Imaging: Ct Abdomen Pelvis Wo Contrast  Result Date: 01/16/2018 CLINICAL DATA:  Lower abdominal pain. Patient would not drink contrast secondary to confusion. EXAM: CT ABDOMEN AND PELVIS WITHOUT CONTRAST TECHNIQUE: Multidetector CT imaging of the abdomen and pelvis was performed following the standard protocol without IV contrast. COMPARISON:  CT 01/07/2018 FINDINGS: Lower chest: Lung bases are clear.  No pleural or pericardial fluid. Hepatobiliary: Normal Pancreas: Normal Spleen: Normal Adrenals/Urinary Tract: Adrenal glands are normal. Kidneys are normal except for a hyperdense cyst in the right midportion measuring 15 mm in diameter, inapparent on the previous contrast-enhanced study. Stomach/Bowel: No evidence of bowel obstruction. Drainage catheter in the right pelvis with good drainage of the previously seen large right-sided abscess. Small amount of residual material in communication with the catheter. Previous surgery at the right ileocecal junction region. 2 cm well-circumscribed hyperdense focus adjacent to the ileocecal valve not present previously. The differential diagnosis for this includes postprocedure hematoma versus rapid recurrence of tumor versus small leak collection with hyperdensity secondary to previously administered contrast. Vascular/Lymphatic: Mild aortic atherosclerosis. Reproductive: Previous hysterectomy.  Pessary in place. Other: No free fluid or air. Musculoskeletal: Chronic degenerative changes of the lumbar spine. IMPRESSION: Further continued decrease in right pelvic abscess. This appears completely drained presently, with only a small amount of density surrounding the drainage catheter. New demonstration of 2 small hyperdense areas in the right lower quadrant adjacent to the anastomosis/drainage catheter, the larger measuring 2 cm in diameter. The differential diagnosis is postprocedural hematoma versus rapid recurrence of tumor versus  is small leak collections. These warrant additional follow-up. Electronically Signed   By: Paulina Fusi M.D.   On: 01/16/2018 14:43   Ct Head Wo Contrast  Result Date: 01/17/2018 CLINICAL DATA:  Neuroendocrine carcinomatosis, carcinoid syndrome. EXAM: CT HEAD WITHOUT CONTRAST TECHNIQUE: Contiguous axial images were obtained from the base of the skull through the vertex without intravenous contrast. COMPARISON:  CT HEAD April 14, 2015 FINDINGS: BRAIN: No intraparenchymal hemorrhage, mass effect nor midline shift. Moderate to severe parenchymal brain volume loss, progressed from 2016. No hydrocephalus. Patchy supratentorial white matter hypodensities less than expected for patient's age, though non-specific are most compatible with chronic small vessel ischemic disease. No acute large vascular territory infarcts. No abnormal extra-axial fluid collections. Basal cisterns are patent. VASCULAR: Moderate to severe calcific atherosclerosis of the carotid siphons. SKULL: No skull fracture. No significant scalp soft tissue swelling. SINUSES/ORBITS: The mastoid air-cells and included paranasal sinuses are well-aerated.The included ocular globes and orbital contents are non-suspicious. Status post RIGHT ocular lens implant. OTHER: None. IMPRESSION: 1. No acute intracranial process. 2. Moderate to severe parenchymal brain volume loss and mild chronic small vessel ischemic disease. Electronically Signed   By: Awilda Metro M.D.   On: 01/17/2018 19:14   Ir Fluoro Guide  Cv Line Right  Result Date: 01/17/2018 INDICATION: 84 year old female with a history bowel resection, poor nutrition, need for parental nutrition EXAM: IMAGE GUIDED TUNNELED CENTRAL CATHETER MEDICATIONS: None ANESTHESIA/SEDATION: Moderate (conscious) sedation was employed during this procedure. A total of Versed 1.0 mg and Fentanyl 50 mcg was administered intravenously. Moderate Sedation Time: 13 minutes. The patient's level of consciousness and vital  signs were monitored continuously by radiology nursing throughout the procedure under my direct supervision. FLUOROSCOPY TIME:  Fluoroscopy Time: 0 minutes 6 seconds (0 mGy). COMPLICATIONS: None PROCEDURE: After written informed consent was obtained, patient was placed in the supine position on angiographic table. Patency of the right internal jugular vein was confirmed with ultrasound with image documentation. Patient was prepped and draped in the usual sterile fashion including the right neck and right superior chest. Using ultrasound guidance, the skin and subcutaneous tissues overlying the right internal jugular vein were generously infiltrated with 1% lidocaine without epinephrine. Using ultrasound guidance, the right internal jugular vein was punctured with a micropuncture needle, and an 018 wire was advanced into the right heart confirming venous access. A small stab incision was made with an 11 blade scalpel. Peel-away sheath was placed over the wire, and then the wire was removed, marking the wire for estimation of internal catheter length. The chest wall was then generously infiltrated with 1% lidocaine for local anesthesia along the tissue tract. Small stab incision was made with 11 blade scalpel, and then the catheter was back tunneled to the puncture site at the right internal jugular vein. Catheter was pulled through the tract, with the catheter amputated at 20 cm. Catheter was advanced through the peel-away sheath, and the peel-away sheath was removed. Final image was stored. The catheter was anchored to the chest wall with 2 retention sutures, and Derma bond was used to seal the right internal jugular vein incision site and at the right chest wall. Patient tolerated the procedure well and remained hemodynamically stable throughout. No complications were encountered and no significant blood loss was encountered. IMPRESSION: Status post right IJ tunneled central catheter. Signed, Yvone Neu. Loreta Ave, DO  [facility] Radiology Electronically Signed   By: Gilmer Mor D.O.   On: 01/17/2018 09:45   Ir US Guide Vasc Access Right  Result Date: 01/17/2018 INDICATION: 84 year old female with a history bowel resection, poor nutrition, need for parental nutrition EXAM: IMAGE GUIDED TUNNELED CENTRAL CATHETER MEDICATIONS: None ANESTHESIA/SEDATION: Moderate (conscious) sedation was employed during this procedure. A total of Versed 1.0 mg and Fentanyl 50 mcg was administered intravenously. Moderate Sedation Time: 13 minutes. The patient's level of consciousness and vital signs were monitored continuously by radiology nursing throughout the procedure under my direct supervision. FLUOROSCOPY TIME:  Fluoroscopy Time: 0 minutes 6 seconds (0 mGy). COMPLICATIONS: None PROCEDURE: After written informed consent was obtained, patient was placed in the supine position on angiographic table. Patency of the right internal jugular vein was confirmed with ultrasound with image documentation. Patient was prepped and draped in the usual sterile fashion including the right neck and right superior chest. Using ultrasound guidance, the skin and subcutaneous tissues overlying the right internal jugular vein were generously infiltrated with 1% lidocaine without epinephrine. Using ultrasound guidance, the right internal jugular vein was punctured with a micropuncture needle, and an 018 wire was advanced into the right heart confirming venous access. A small stab incision was made with an 11 blade scalpel. Peel-away sheath was placed over the wire, and then the wire was removed,  marking the wire for estimation of internal catheter length. The chest wall was then generously infiltrated with 1% lidocaine for local anesthesia along the tissue tract. Small stab incision was made with 11 blade scalpel, and then the catheter was back tunneled to the puncture site at the right internal jugular vein.  Catheter was pulled through the tract, with the catheter amputated at 20 cm. Catheter was advanced through the peel-away sheath, and the peel-away sheath was removed. Final image was stored. The catheter was anchored to the chest wall with 2 retention sutures, and Derma bond was used to seal the right internal jugular vein incision site and at the right chest wall. Patient tolerated the procedure well and remained hemodynamically stable throughout. No complications were encountered and no significant blood loss was encountered. IMPRESSION: Status post right IJ tunneled central catheter. Signed, Yvone Neu. Loreta Ave, DO [facility] Radiology Electronically Signed   By: Gilmer Mor D.O.   On: 01/17/2018 09:45    Labs:  CBC: Recent Labs    01/13/18 1756 01/14/18 0425 01/15/18 0415 01/17/18 0647  WBC 8.0 9.1 13.7* 8.1  HGB 8.9* 8.9* 8.6* 8.4*  HCT 28.9* 29.6* 28.8* 28.8*  PLT 885* 915* 915* 746*    COAGS: Recent Labs    11/28/17 1220 12/24/17 1301 01/08/18 1452 01/17/18 0718  INR 1.08 1.25 1.19 1.25    BMP: Recent Labs    01/15/18 0855 01/16/18 0444 01/17/18 0647 01/18/18 0429  NA 149* 150* 150* 144  K 3.9 3.6 3.0* 3.5  CL 113* 112* 115* 110  CO2 23 25 22 25   GLUCOSE 207* 98 127* 151*  BUN 33* 34* 28* 25*  CALCIUM 8.9 8.8* 8.5* 8.1*  CREATININE 1.13* 1.37* 1.20* 1.35*  GFRNONAA 43* 34* 40* 35*  GFRAA 50* 40* 47* 41*    LIVER FUNCTION TESTS: Recent Labs    01/10/18 0401 01/14/18 0425 01/16/18 0444 01/17/18 0647  BILITOT 0.6 0.5 0.6 0.7  AST 27 31 33 25  ALT 32 38 40 31  ALKPHOS 182* 186* 162* 134*  PROT 5.7* 6.1* 6.3* 6.0*  ALBUMIN 1.7* 1.9* 2.0* 1.9*    Assessment and Plan: 1. Intra-abdominal abscess, s/p perc drain Minimal output.  Only 2.5cc in last 24hrs.  Spoke to Dr. Donell Beers this morning who leaned more towards removing the drain as her scan showed this collection was fairly well drained.  I have spoken to  Dr. Grace Isaac to discuss the decrease in output, but to also address the change in consistency of the output, serous to more blood-tinged, likely secondary to her anticoagulation given her other sites of oozing.  He was agreeable to look at removing the drain with 2 options, just remove it or inject first and then remove if ok.  He felt like the second may be a good idea.  He felt like there still may be a little residual cavity on her recent CT scan, but said if Dr. Donell Beers really just wanted it out then it would be feasible to do that as well, given her confusion and tendency to pull at things even with mittens on.  I have a call into her to discuss the options and see what she would like to do.  CX shows bacteria, but none predominant.  She is being treated with 14 days total of abx therapy. Electronically Signed: Letha Cape 01/18/2018, 11:12 AM   I spent a total of 25 Minutes at the the patient's bedside AND on the  patient's hospital floor or unit, greater than 50% of which was counseling/coordinating care for intra-abdominal abscess

## 2018-01-18 NOTE — Progress Notes (Signed)
PHARMACY - ADULT TOTAL PARENTERAL NUTRITION CONSULT NOTE   Pharmacy Consult: TPN Indication: Intolerance to enteral feeding  Patient Measurements: Height: '5\' 5"'  (165.1 cm)(12/04/17) Weight: 119 lb 0.8 oz (54 kg) IBW/kg (Calculated) : 57 TPN AdjBW (KG): 57.6 Body mass index is 19.81 kg/m.  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  Patient was re-admitted from SNF after she began having nausea and vomiting and unable to tolerate enteral feeding.  Found to have a large dilated segment of bowel with bezoar and necrosis, now s/p SBR x 2.  Pharmacy managing TPN for nutritional support.  GI: Albumin low at 1.9. Prealbumin up to 27.3 (BL 5.3), having BMs, RLQ drain O/P is minimal. PPI. PRN Zofran/Phenergan with continued N/V. Possible choledocholithiasis on CT, repeat CT 1/30 - postprocedural hematoma vs rapid recurrence of tumor vs small leak collections. Endo: DM on metformin PTA. CBGs labile (50-200s). Low of 59 was before TPN started Relying mostly on SSI to cover for hyperglycemia instead of increasing insulin in TPN and exposing pt to risk of hypoglycemia.  Insulin requirements in the past 24 hours: 9 units SSI + 25 units in TPN (TPN not hung 1/30) Lytes: all wnl exc K borderline low at 3.5 but up overall. Phos and Mg ok. CoCa 9.7 Renal: SCr elevated at 1.35, BUN down to 25 - no UOP charted. Net +9.2L since admit. Pulm: stable on RA Cards: BP variable, HR 100s - asa AC: Lovenox acute nonocclusive DVT - hgb low but stable, plts down to 915. Vaginal bleeding 1/27 and continuing, so Lovenox reduced to 13m SQ Q12H by MD, now to 550mSQ q24h with CrCl<30. Hepatobil: Alk phos elevated but trending down, AST/ALT/tbili WNL. TG up to 172 Neuro: confused - Lexapro, Remeron, s/p Haldol for sundowning (QTc [id number] morphine prn ID: Zosyn (1/14 >> ) for IAI. 1/21 CT showed abscess (cx with mult organisms, none predominant) - afebrile, WBC down  to wnl  TPN Access: PICC placed 12/24/17>> pulled out 1/17; replaced 01/04/18>>pulled out again 1/30, replace with R IJ 1/31>> TPN start date: 12/25/17  Nutritional Goals (per RD rec on 1/31): 1500-1700 kCal  75-90 gram protein per day > 1.5 L of fluid per day  Current Nutrition: TPN - no 1/30 TPN due to pt pulling PICC out NPO Boost Breeze TID (provides 250 kcal + 9 grams protein for each supplement - refusing all doses currently)  Plan:  Continue TPN at 65 ml/hr  TPN will provide 90g AA, 234g CHO, and 37g of fat emulsion, which equals 1,532 kCals per day, meeting 100% of patient needs Electrolytes in TPN: continue max acetate, low Na, Ca. Increase K Daily multivitamin and trace elements in TPN Continue sensitive SSI Q4H + decrease insulin in TPN to 25 units Continue [facility] Monitor TPN labs F/U PO intake/diet advancement and ability to wean TPN   NatElenor QuinonesharmD, BCPRockford Gastroenterology Associates Ltdinical Pharmacist Pager [phone]/2019 8:31 AM

## 2018-01-18 NOTE — Consult Note (Signed)
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  CURRENT TREATMENT: Observation   HISTORY OF CURRENT ILLNESS: Theresa Howe presented to her primary care physician in October 2018 with significant weight loss, blood in the stool, and some nausea complaints.  A CT scan of the abdomen was obtained September 25, 2017 which showed a 3.1 cm mesenteric mass as well as a 2.9 cm small bowel mass.  There was also a dilated pancreatic duct, but this was chronic, and had been evaluated in 2014 with the EUS.  Furthermore she had an MRI of the abdomen and 10/04/2027 showing no evidence of a pancreatic mass.  At that time there was a liver mass noted measuring 1.1 cm, which by additional studies (10/17/2017) was shown to be vascular and not malignant.  Accordingly on 12/04/2017 she proceeded to small bowel resection under Dr. Donell Beers.  The pathology from this procedure ([id number]) showed in the small intestine, low-grade neuroendocrine tumor, measuring 3.8 cm, extending to the small bowel serosa, with clear proximal and distal margins.  There were 4 mesenteric nodules consistent with lymph node metastases and metastatic low-grade neuroendocrine tumor was also seen in 3 out of 11 sampled lymph nodes.  Omental biopsy showed nodules of low-grade neuroendocrine tumor involving the omental adipose tissue.  The patient did generally well after the procedure but subsequently developed a partial small bowel obstruction, with repeat CT scan suggesting a bezoar.  This was resected on 12/27/2017.  The pathology again showed a carcinoid tumor (low-grade neuroendocrine tumor) involving the smooth move muscle and fibroadipose tissue.  The  patient has had a complex postoperative recovery, complicated by confusion, right femoral DVT, and a right lower quadrant abscess which had a drain placed on 01/09/2018.  The patient is currently on TNA, intravenous antibiotics, and is bed confined, with fall precautions (the patient according to staff constantly tries to get out of bed) and with mittens in place to keep her from removing her drain.  We were consulted to evaluate the need for treatment or further evaluation of her carcinoid tumor  INTERVAL HISTORY: I evaluated the patient in her hospital room the morning of 01/18/2018.  There was no family present  REVIEW OF SYSTEMS: The patient tells me she is doing "fine".  She denies pain.  However she does not know that she is in the hospital, does not know the year, and expressed surprised to hear that she might have cancer.  She was unable to answer other general questions and therefore an accurate review of systems could not be obtained.  PAST MEDICAL HISTORY: Past Medical History:  Diagnosis Date  . Abdominal mass 10/2017   SMALL BOWEL  . Diabetes mellitus    type 2  . Dyspnea   . Failure to thrive (0-17) 12/24/2017  . Glaucoma   . Hyperlipidemia   . Hypertension   . Neuropathy   . Osteoporosis   . Wheezing     PAST SURGICAL HISTORY: Past Surgical History:  Procedure Laterality Date  . ABDOMINAL HYSTERECTOMY    . CATARACT EXTRACTION Right 01/2010  . COLON SURGERY    . COLONOSCOPY    . EUS N/A 04/17/2013   Procedure: UPPER ENDOSCOPIC ULTRASOUND (EUS) LINEAR;  Surgeon: Reuel Boom  Marye Round, MD;  Location: [facility];  Service: Endoscopy;  Laterality: N/A;  . GLAUCOMA SURGERY  2007  . IR FLUORO GUIDE CV LINE RIGHT  01/17/2018  . IR IMAGE GUIDED DRAINAGE PERCUT CATH  PERITONEAL RETROPERIT  01/09/2018  . IR US GUIDE VASC ACCESS RIGHT  01/17/2018  . LAPAROSCOPIC SMALL BOWEL RESECTION  12/04/2017  . LAPAROSCOPIC SMALL BOWEL RESECTION N/A 12/04/2017   Procedure: LAPAROSCOPIC SMALL  BOWEL RESECTION;  Surgeon: Almond Lint, MD;  Location: [facility] OR;  Service: General;  Laterality: N/A;  . LAPAROSCOPY N/A 12/04/2017   Procedure: LAPAROSCOPY DIAGNOSTIC;  Surgeon: Almond Lint, MD;  Location: [facility] OR;  Service: General;  Laterality: N/A;  . LAPAROSCOPY N/A 12/27/2017   Procedure: DIAGNOSTIC LAPAROSCOPY/ LAPAROTOMY WITH SMALL BOWEL RESECTION TIMES TWO;  Surgeon: Almond Lint, MD;  Location: [facility] OR;  Service: General;  Laterality: N/A;  . MULTIPLE TOOTH EXTRACTIONS    . PICC LINE INSERTION  12/24/2017    FAMILY HISTORY History reviewed. No pertinent family history.  GYNECOLOGIC HISTORY:  No LMP recorded. Patient has had a hysterectomy.   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  As to who is home with her she was unable to answer that question    ADVANCED DIRECTIVES: Currently the patient is full code.  I do not find that advanced directives have been reviewed in EPIC   HEALTH MAINTENANCE: Social History   Tobacco Use  . Smoking status: Never Smoker  . Smokeless tobacco: Never Used  Substance Use Topics  . Alcohol use: No  . Drug use: No      No Known Allergies  Current Facility-Administered Medications  Medication Dose Route Frequency Provider Last Rate Last Dose  . 0.9 %  sodium chloride infusion   Intravenous Once Almond Lint, MD      . 0.9 %  sodium chloride infusion  10 mL/hr Intravenous Once Burt Ek, CRNA      . acetaminophen (TYLENOL) tablet 650 mg  650 mg Oral Q6H PRN Almond Lint, MD   650 mg at 01/05/18 1253   Or  . acetaminophen (TYLENOL) suppository 650 mg  650 mg Rectal Q6H PRN Almond Lint, MD   650 mg at 12/31/17 1600  . albuterol (PROVENTIL) (2.5 MG/3ML) 0.083% nebulizer solution 3 mL  3 mL Inhalation Q6H PRN Almond Lint, MD      . aspirin chewable tablet 81 mg  81 mg Oral Daily Almond Lint, MD   81 mg at 01/18/18 1016  .  chlorhexidine (PERIDEX) 0.12 % solution 15 mL  15 mL Mouth Rinse BID Almond Lint, MD   15 mL at 01/18/18 1017  . collagenase (SANTYL) ointment 1 application  1 application Topical Daily Almond Lint, MD   1 application at 01/18/18 1017  . dextrose 5 % solution   Intravenous Continuous Jonah Blue, MD 75 mL/hr at 01/17/18 1922 1 mL at 01/17/18 1922  . diphenhydrAMINE (BENADRYL) 12.5 MG/5ML elixir 12.5 mg  12.5 mg Oral Q6H PRN Almond Lint, MD   12.5 mg at 12/29/17 2224   Or  . diphenhydrAMINE (BENADRYL) injection 12.5 mg  12.5 mg Intravenous Q6H PRN Almond Lint, MD   12.5 mg at 12/30/17 2150  . dorzolamide (TRUSOPT) 2 % ophthalmic solution 1 drop  1 drop Both Eyes BID Almond Lint, MD   1 drop at 01/18/18 1016  . enoxaparin (LOVENOX) injection  55 mg  1 mg/kg Subcutaneous Q24H Almond Lint, MD   55 mg at 01/17/18 2121  . escitalopram (LEXAPRO) tablet 5 mg  5 mg Oral QHS Almond Lint, MD   5 mg at 01/17/18 2121  . feeding supplement (BOOST / RESOURCE BREEZE) liquid 1 Container  1 Container Oral TID BM Almond Lint, MD   1 Container at 01/14/18 2106  . fluticasone (FLONASE) 50 MCG/ACT nasal spray 2 spray  2 spray Each Nare Daily Almond Lint, MD   2 spray at 01/18/18 1016  . hydrALAZINE (APRESOLINE) injection 10 mg  10 mg Intravenous Q2H PRN Almond Lint, MD      . insulin aspart (novoLOG) injection 0-9 Units  0-9 Units Subcutaneous Q4H Batchelder, Para March, RPH      . lactated ringers infusion   Intravenous Continuous Almond Lint, MD 10 mL/hr at 01/14/18 2117    . latanoprost (XALATAN) 0.005 % ophthalmic solution 1 drop  1 drop Both Eyes QHS Almond Lint, MD   1 drop at 01/17/18 2123  . MEDLINE mouth rinse  15 mL Mouth Rinse q12n4p Almond Lint, MD   15 mL at 01/17/18 1644  . mirtazapine (REMERON) tablet 7.5 mg  7.5 mg Oral QHS Almond Lint, MD   7.5 mg at 01/17/18 2121  . morphine 4 MG/ML injection 1-2 mg  1-2 mg Intravenous Q2H PRN Almond Lint, MD   2 mg at 01/17/18 1257  .  ondansetron (ZOFRAN-ODT) disintegrating tablet 4 mg  4 mg Oral Q6H PRN Almond Lint, MD       Or  . ondansetron (ZOFRAN) injection 4 mg  4 mg Intravenous Q6H PRN Almond Lint, MD   4 mg at 01/14/18 2114  . pantoprazole (PROTONIX) injection 40 mg  40 mg Intravenous QHS Almond Lint, MD   40 mg at 01/17/18 2121  . piperacillin-tazobactam (ZOSYN) IVPB 3.375 g  3.375 g Intravenous Q8H Quenton Fetter, RPH   Stopped at 01/18/18 0901  . prochlorperazine (COMPAZINE) tablet 10 mg  10 mg Oral Q6H PRN Almond Lint, MD       Or  . prochlorperazine (COMPAZINE) injection 5-10 mg  5-10 mg Intravenous Q6H PRN Almond Lint, MD   5 mg at 12/30/17 1521  . sodium chloride flush (NS) 0.9 % injection 10-40 mL  10-40 mL Intracatheter PRN Almond Lint, MD   10 mL at 01/11/18 1531  . sodium chloride flush (NS) 0.9 % injection 10-40 mL  10-40 mL Intracatheter PRN Almond Lint, MD   20 mL at 01/15/18 0434  . sodium chloride flush (NS) 0.9 % injection 10-40 mL  10-40 mL Intracatheter PRN Almond Lint, MD      . sodium chloride flush (NS) 0.9 % injection 5 mL  5 mL Intravenous Q8H Malachy Moan, MD   5 mL at 01/17/18 1341  . TPN ADULT (ION)   Intravenous Continuous TPN Almon Hercules, RPH 65 mL/hr at 01/17/18 1657    . TPN ADULT (ION)   Intravenous Continuous TPN Armandina Stammer, [place]        OBJECTIVE: Older African-American woman examined in bed  Vitals:   01/17/18 2148 01/18/18 0410  BP: (!) 144/65 (!) 152/73  Pulse: 90 88  Resp: 17 16  Temp: 98.2 F (36.8 C) (!) 97.5 F (36.4 C)  SpO2: 100% 100%     Body mass index is 19.81 kg/m.   Wt Readings from Last 3 Encounters:  01/14/18 119 lb 0.8 oz (54 kg)  12/10/17 127 lb (  57.6 kg)  11/28/17 106 lb 3.2 oz (48.2 kg)      ECOG FS:4 - Bedbound  Lymphatic: No cervical or supraclavicular adenopathy Lungs no rales or rhonchi, auscultated anterolaterally Heart regular rate and rhythm Abd soft, nontender, positive though diminished bowel sounds,  pelvic drain in place with some serosanguineous fluid in the bulb [facility] mittens in place Neuro: non-focal, disoriented, pleasant and cooperative affect Breasts: Deferred   LAB RESULTS:  CMP     Component Value Date/Time   NA 144 01/18/2018 0429   K 3.5 01/18/2018 0429   CL 110 01/18/2018 0429   CO2 25 01/18/2018 0429   GLUCOSE 151 (H) 01/18/2018 0429   BUN 25 (H) 01/18/2018 0429   CREATININE 1.35 (H) 01/18/2018 0429   CREATININE 0.97 (H) 10/05/2017 1400   CALCIUM 8.1 (L) 01/18/2018 0429   PROT 6.0 (L) 01/17/2018 0647   ALBUMIN 1.9 (L) 01/17/2018 0647   AST 25 01/17/2018 0647   ALT 31 01/17/2018 0647   ALKPHOS 134 (H) 01/17/2018 0647   BILITOT 0.7 01/17/2018 0647   GFRNONAA 35 (L) 01/18/2018 0429   GFRNONAA 54 (L) 10/05/2017 1400   GFRAA 41 (L) 01/18/2018 0429   GFRAA 62 10/05/2017 1400    No results found for: TOTALPROTELP, ALBUMINELP, A1GS, A2GS, BETS, BETA2SER, GAMS, MSPIKE, SPEI  No results found for: KPAFRELGTCHN, LAMBDASER, KAPLAMBRATIO  Lab Results  Component Value Date   WBC 8.1 01/17/2018   NEUTROABS 6.3 01/14/2018   HGB 8.4 (L) 01/17/2018   HCT 28.8 (L) 01/17/2018   MCV 89.4 01/17/2018   PLT 746 (H) 01/17/2018    @LASTCHEMISTRY @  No results found for: LABCA2  No components found for: WGNFAO130  Recent Labs  Lab 01/17/18 0718  INR 1.25    No results found for: LABCA2  No results found for: QMV784  No results found for: ONG295  No results found for: MWU132  No results found for: CA2729  No components found for: HGQUANT  No results found for: CEA1 / No results found for: CEA1   No results found for: AFPTUMOR  No results found for: CHROMOGRNA  No results found for: PSA1  Admission on 12/24/2017  No results displayed because visit has over 200 results.      (this displays the last labs from the last 3 days)  No results found for: TOTALPROTELP, ALBUMINELP, A1GS, A2GS, BETS, BETA2SER, GAMS, MSPIKE, SPEI (this displays SPEP  labs)  No results found for: KPAFRELGTCHN, LAMBDASER, KAPLAMBRATIO (kappa/lambda light chains)  No results found for: HGBA, HGBA2QUANT, HGBFQUANT, HGBSQUAN (Hemoglobinopathy evaluation)   No results found for: LDH  No results found for: IRON, TIBC, IRONPCTSAT (Iron and TIBC)  No results found for: FERRITIN  Urinalysis    Component Value Date/Time   COLORURINE RED (A) 01/14/2018 1500   APPEARANCEUR TURBID (A) 01/14/2018 1500   LABSPEC NOT CALCULATED 01/14/2018 1500   PHURINE NOT CALCULATED 01/14/2018 1500   GLUCOSEU (A) 01/14/2018 1500    TEST NOT REPORTED DUE TO COLOR INTERFERENCE OF URINE PIGMENT   HGBUR (A) 01/14/2018 1500    TEST NOT REPORTED DUE TO COLOR INTERFERENCE OF URINE PIGMENT   BILIRUBINUR (A) 01/14/2018 1500    TEST NOT REPORTED DUE TO COLOR INTERFERENCE OF URINE PIGMENT   KETONESUR (A) 01/14/2018 1500    TEST NOT REPORTED DUE TO COLOR INTERFERENCE OF URINE PIGMENT   PROTEINUR (A) 01/14/2018 1500    TEST NOT REPORTED DUE TO COLOR INTERFERENCE OF URINE PIGMENT   NITRITE (A) 01/14/2018 1500  TEST NOT REPORTED DUE TO COLOR INTERFERENCE OF URINE PIGMENT   LEUKOCYTESUR (A) 01/14/2018 1500    TEST NOT REPORTED DUE TO COLOR INTERFERENCE OF URINE PIGMENT     STUDIES: Ct Abdomen Pelvis Wo Contrast  Result Date: 01/16/2018 CLINICAL DATA:  Lower abdominal pain. Patient would not drink contrast secondary to confusion. EXAM: CT ABDOMEN AND PELVIS WITHOUT CONTRAST TECHNIQUE: Multidetector CT imaging of the abdomen and pelvis was performed following the standard protocol without IV contrast. COMPARISON:  CT 01/07/2018 FINDINGS: Lower chest: Lung bases are clear.  No pleural or pericardial fluid. Hepatobiliary: Normal Pancreas: Normal Spleen: Normal Adrenals/Urinary Tract: Adrenal glands are normal. Kidneys are normal except for a hyperdense cyst in the right midportion measuring 15 mm in diameter, inapparent on the previous contrast-enhanced study. Stomach/Bowel: No evidence  of bowel obstruction. Drainage catheter in the right pelvis with good drainage of the previously seen large right-sided abscess. Small amount of residual material in communication with the catheter. Previous surgery at the right ileocecal junction region. 2 cm well-circumscribed hyperdense focus adjacent to the ileocecal valve not present previously. The differential diagnosis for this includes postprocedure hematoma versus rapid recurrence of tumor versus small leak collection with hyperdensity secondary to previously administered contrast. Vascular/Lymphatic: Mild aortic atherosclerosis. Reproductive: Previous hysterectomy.  Pessary in place. Other: No free fluid or air. Musculoskeletal: Chronic degenerative changes of the lumbar spine. IMPRESSION: Further continued decrease in right pelvic abscess. This appears completely drained presently, with only a small amount of density surrounding the drainage catheter. New demonstration of 2 small hyperdense areas in the right lower quadrant adjacent to the anastomosis/drainage catheter, the larger measuring 2 cm in diameter. The differential diagnosis is postprocedural hematoma versus rapid recurrence of tumor versus is small leak collections. These warrant additional follow-up. Electronically Signed   By: Paulina Fusi M.D.   On: 01/16/2018 14:43   Ct Head Wo Contrast  Result Date: 01/17/2018 CLINICAL DATA:  Neuroendocrine carcinomatosis, carcinoid syndrome. EXAM: CT HEAD WITHOUT CONTRAST TECHNIQUE: Contiguous axial images were obtained from the base of the skull through the vertex without intravenous contrast. COMPARISON:  CT HEAD April 14, 2015 FINDINGS: BRAIN: No intraparenchymal hemorrhage, mass effect nor midline shift. Moderate to severe parenchymal brain volume loss, progressed from 2016. No hydrocephalus. Patchy supratentorial white matter hypodensities less than expected for patient's age, though non-specific are most compatible with chronic small vessel  ischemic disease. No acute large vascular territory infarcts. No abnormal extra-axial fluid collections. Basal cisterns are patent. VASCULAR: Moderate to severe calcific atherosclerosis of the carotid siphons. SKULL: No skull fracture. No significant scalp soft tissue swelling. SINUSES/ORBITS: The mastoid air-cells and included paranasal sinuses are well-aerated.The included ocular globes and orbital contents are non-suspicious. Status post RIGHT ocular lens implant. OTHER: None. IMPRESSION: 1. No acute intracranial process. 2. Moderate to severe parenchymal brain volume loss and mild chronic small vessel ischemic disease. Electronically Signed   By: Awilda Metro M.D.   On: 01/17/2018 19:14   Ct Abdomen Pelvis W Contrast  Result Date: 01/07/2018 CLINICAL DATA:  84 year old female with diffuse abdominal pain EXAM: CT ABDOMEN AND PELVIS WITH CONTRAST TECHNIQUE: Multidetector CT imaging of the abdomen and pelvis was performed using the standard protocol following bolus administration of intravenous contrast. CONTRAST:  ISOVUE-300 IOPAMIDOL (ISOVUE-300) INJECTION 61% COMPARISON:  Recent prior CT scan of the abdomen and pelvis 12/24/2017 FINDINGS: Lower chest: Mild dependent atelectasis. PICC line terminates in the right atrium. The heart is normal in size. No pericardial effusion. Unremarkable distal thoracic  esophagus. Hepatobiliary: Normal hepatic contour and morphology. No discrete hepatic lesions. Normal appearance of the gallbladder. No intra or extrahepatic biliary ductal dilatation. Pancreas: Slight interval increase in dilatation of the main pancreatic duct to 6 mm compared to 4-5 mm previously. Additionally, there is a suggestion of a filling defect in the distal common bile duct concerning for choledocholithiasis. No significant pancreatic inflammation and no evidence of pancreatic mass. Spleen: Normal in size without focal abnormality. Adrenals/Urinary Tract: Normal adrenal glands. No  hydronephrosis, enhancing renal mass or nephrolithiasis. Unremarkable ureters and bladder. Stomach/Bowel: The stomach is within normal limits. Evidence of prior small bowel resection. The anastomoses appear patent. No evidence of obstruction. Persistent peripherally enhancing fluid and gas collection in the right anatomic pelvis which has significantly decreased in size presently measuring approximately 8.7 x 4.4 cm compared to 11.3 by 9 cm. There is a small sinus tract extending toward the inferior end of the midline abdominal wound. A second small focus of peripherally enhancing fluid is identified at the bladder dome measuring 1.8 cm. Small amount of loculated fluid within the right pericolic gutter measures 2.0 x 1.3 cm. Vascular/Lymphatic: Atherosclerotic vascular calcifications. No aneurysm. There is a focal filling defect within the right femoral vein in the visualized proximal thigh extending into the right common femoral vein consistent with acute nonocclusive DVT. Reproductive: Pelvic floor laxity. Pessary in place. Status post hysterectomy. No adnexal masses. Other: Intra-abdominal abscesses as described above. Musculoskeletal: No acute osseous abnormality. Severe multilevel degenerative disc disease with levoconvex scoliosis of the lumbar spine. IMPRESSION: 1. Acute nonocclusive DVT in the right common femoral and femoral vein in the proximal thigh. 2. Peripherally enhancing loculated fluid in the right lower quadrant most consistent with an intraabdominal abscess. A small amount of fluid tracks anteriorly to the open anterior abdominal incision. Additional small foci of loculated fluid present anterior to the bladder and in the right pericolic gutter. 3. Slight interval increase in dilatation of the main pancreatic duct with a suggestion of a filling defect in the distal common bile duct. Choledocholithiasis is not excluded. Recommend clinical correlation with serum LFTs/bilirubin. 4. Additional  ancillary findings as above without significant interval change compared to recent prior imaging. These results were called by telephone at the time of interpretation on 01/07/2018 at 4:50 pm to Dr. Almond Lint , who verbally acknowledged these results. Electronically Signed   By: Malachy Moan M.D.   On: 01/07/2018 16:52   Ct Abdomen Pelvis W Contrast  Result Date: 12/24/2017 CLINICAL DATA:  Nausea and vomiting. History of neuroendocrine tumor with carcinomatosis, status post diagnostic laparoscopy with small bowel resection and partial omentectomy 12/04/2017. EXAM: CT ABDOMEN AND PELVIS WITH CONTRAST TECHNIQUE: Multidetector CT imaging of the abdomen and pelvis was performed using the standard protocol following bolus administration of intravenous contrast. CONTRAST:  ISOVUE-300 IOPAMIDOL (ISOVUE-300) INJECTION 61% COMPARISON:  CT abdomen 12/17/2017, noncontrast abdomen/ pelvis CT 09/25/2017 FINDINGS: Lower chest: Breathing motion artifact with mild hypoventilatory change at the right greater than left lung base. No pleural effusion or consolidation. No evidence pulmonary mass. Central line with tip at the atrial caval junction. Hepatobiliary: Subcapsular early enhancing lesion on prior CT is not visualized on this portal venous phase study. No new focal lesion. No biliary dilatation. Gallbladder physiologically distended. Pancreas: Pancreatic ductal prominence measuring 5 mm, chronic. Parenchymal atrophy. No peripancreatic inflammation. Spleen: Normal in size without focal abnormality. Adrenals/Urinary Tract: No adrenal nodule. No hydronephrosis or perinephric edema. Homogeneous renal enhancement with symmetric excretion on delayed phase  imaging. Urinary bladder is physiologically distended without wall thickening. Stomach/Bowel: Lack of enteric contrast and paucity of intra-abdominal fat limits detailed bowel assessment. No abnormal gastric distension. Physiologic fluid in the stomach. Enteric  sutures in the small bowel in the right abdomen. Arising from the enteric sutures is a tubular structure extending from the right upper quadrant of the pelvis containing air, fluid, and fecal material measuring approximately 20 cm cranial caudal, 9 x 11 cm (TR by AP) in the pelvis. Some hyperdensity is noted in the dependent portion. There there is thin peripheral enhancement. Only minimal surrounding inflammation. Apparent bowel connection is adjacent to enteric sutures, for example image 41 series 3 as well as in the right lower quadrant image 44 series 6. More distal small bowel is fluid-filled, mildly dilated measuring up to 4 cm. Small bowel proximally is nondilated. Small bowel distally is fluid-filled and prominent with mild mucosal enhancement. The colon is nondistended. Small volume of colonic stool. Vascular/Lymphatic: Aortic atherosclerosis, mild. No definite mesenteric adenopathy. Reproductive: Pessary in place.  Post hysterectomy. Other: No ascites.  No definite free air. Musculoskeletal: Advanced degenerative change in the spine. No acute osseous abnormality. IMPRESSION: 1. Post recent small bowel resection. Arising from the enteric sutures is a tubular structure containing air, fluid, and fecal material within peripheral enhancement. It is unclear whether this represents a dilated loop of small bowel or extraluminal collection secondary to contained anastomotic leak. There are two separate appearing connections to small bowel which favor bowel dilatation. Consider repeat exam with enteric contrast for further evaluation. 2. Early enhancing lesion on prior hepatic protocol CT is not seen, favoring vascular phenomenon. Electronically Signed   By: Rubye Oaks M.D.   On: 12/24/2017 23:21   Ir Fluoro Guide Cv Line Right  Result Date: 01/17/2018 INDICATION: 84 year old female with a history bowel resection, poor nutrition, need for parental nutrition EXAM: IMAGE GUIDED TUNNELED CENTRAL CATHETER  MEDICATIONS: None ANESTHESIA/SEDATION: Moderate (conscious) sedation was employed during this procedure. A total of Versed 1.0 mg and Fentanyl 50 mcg was administered intravenously. Moderate Sedation Time: 13 minutes. The patient's level of consciousness and vital signs were monitored continuously by radiology nursing throughout the procedure under my direct supervision. FLUOROSCOPY TIME:  Fluoroscopy Time: 0 minutes 6 seconds (0 mGy). COMPLICATIONS: None PROCEDURE: After written informed consent was obtained, patient was placed in the supine position on angiographic table. Patency of the right internal jugular vein was confirmed with ultrasound with image documentation. Patient was prepped and draped in the usual sterile fashion including the right neck and right superior chest. Using ultrasound guidance, the skin and subcutaneous tissues overlying the right internal jugular vein were generously infiltrated with 1% lidocaine without epinephrine. Using ultrasound guidance, the right internal jugular vein was punctured with a micropuncture needle, and an 018 wire was advanced into the right heart confirming venous access. A small stab incision was made with an 11 blade scalpel. Peel-away sheath was placed over the wire, and then the wire was removed, marking the wire for estimation of internal catheter length. The chest wall was then generously infiltrated with 1% lidocaine for local anesthesia along the tissue tract. Small stab incision was made with 11 blade scalpel, and then the catheter was back tunneled to the puncture site at the right internal jugular vein. Catheter was pulled through the tract, with the catheter amputated at 20 cm. Catheter was advanced through the peel-away sheath, and the peel-away sheath was removed. Final image was stored. The catheter was anchored to  the chest wall with 2 retention sutures, and Derma bond was used to seal the right internal jugular vein incision site and at the right  chest wall. Patient tolerated the procedure well and remained hemodynamically stable throughout. No complications were encountered and no significant blood loss was encountered. IMPRESSION: Status post right IJ tunneled central catheter. Signed, Yvone Neu. Loreta Ave, DO [facility] Radiology Electronically Signed   By: Gilmer Mor D.O.   On: 01/17/2018 09:45   Ir US Guide Vasc Access Right  Result Date: 01/17/2018 INDICATION: 84 year old female with a history bowel resection, poor nutrition, need for parental nutrition EXAM: IMAGE GUIDED TUNNELED CENTRAL CATHETER MEDICATIONS: None ANESTHESIA/SEDATION: Moderate (conscious) sedation was employed during this procedure. A total of Versed 1.0 mg and Fentanyl 50 mcg was administered intravenously. Moderate Sedation Time: 13 minutes. The patient's level of consciousness and vital signs were monitored continuously by radiology nursing throughout the procedure under my direct supervision. FLUOROSCOPY TIME:  Fluoroscopy Time: 0 minutes 6 seconds (0 mGy). COMPLICATIONS: None PROCEDURE: After written informed consent was obtained, patient was placed in the supine position on angiographic table. Patency of the right internal jugular vein was confirmed with ultrasound with image documentation. Patient was prepped and draped in the usual sterile fashion including the right neck and right superior chest. Using ultrasound guidance, the skin and subcutaneous tissues overlying the right internal jugular vein were generously infiltrated with 1% lidocaine without epinephrine. Using ultrasound guidance, the right internal jugular vein was punctured with a micropuncture needle, and an 018 wire was advanced into the right heart confirming venous access. A small stab incision was made with an 11 blade scalpel. Peel-away sheath was placed over the wire, and then the wire was removed, marking the wire for estimation of internal catheter  length. The chest wall was then generously infiltrated with 1% lidocaine for local anesthesia along the tissue tract. Small stab incision was made with 11 blade scalpel, and then the catheter was back tunneled to the puncture site at the right internal jugular vein. Catheter was pulled through the tract, with the catheter amputated at 20 cm. Catheter was advanced through the peel-away sheath, and the peel-away sheath was removed. Final image was stored. The catheter was anchored to the chest wall with 2 retention sutures, and Derma bond was used to seal the right internal jugular vein incision site and at the right chest wall. Patient tolerated the procedure well and remained hemodynamically stable throughout. No complications were encountered and no significant blood loss was encountered. IMPRESSION: Status post right IJ tunneled central catheter. Signed, Yvone Neu. Loreta Ave, DO [facility] Radiology Electronically Signed   By: Gilmer Mor D.O.   On: 01/17/2018 09:45   Dg Chest Port 1 View  Result Date: 01/04/2018 CLINICAL DATA:  Right-sided PICC line placement. Small bowel resection. EXAM: PORTABLE CHEST 1 VIEW COMPARISON:  09/25/2017 FINDINGS: The heart size and mediastinal contours are within normal limits. Right arm PICC line extends to the SVC/RA junction. Lungs show low volumes with bibasilar atelectasis. There is no evidence of pulmonary edema, consolidation, pneumothorax, nodule or pleural fluid. The visualized skeletal structures are unremarkable. IMPRESSION: PICC line tip lies at the SVC/RA junction. Electronically Signed   By: Irish Lack M.D.   On: 01/04/2018 12:34   Dg Abd Portable 1v  Result Date: 12/29/2017 CLINICAL DATA:  NG tube placement EXAM: PORTABLE ABDOMEN - 1 VIEW COMPARISON:  12/28/2017, CT 12/24/2016 FINDINGS: Scoliosis and degenerative changes  of the spine. Esophageal tube tip and side port overlie the proximal to mid stomach.  Postsurgical changes in the left upper and right lower quadrants. Possible dilation of small bowel in the left lower quadrant but with colon gas present. Linear lucency along the left flank, suspect that this may be artifactual and related to technique IMPRESSION: Esophageal tube tip and side port overlie the proximal to mid stomach Electronically Signed   By: Jasmine Pang M.D.   On: 12/29/2017 00:51   Dg Abd Portable 1v  Result Date: 12/28/2017 CLINICAL DATA:  Evaluate NG tube placement EXAM: PORTABLE ABDOMEN - 1 VIEW COMPARISON:  None. FINDINGS: The distal tip of the NG tube is in the right side of the abdomen, likely in the distal antrum or pyloric region. The side port is in the distal body of the stomach. No other acute abnormalities. IMPRESSION: NG tube placement as above. Electronically Signed   By: Gerome Sam III M.D   On: 12/28/2017 09:41   Ir Image Guided Drainage Percut Cath  Peritoneal Retroperit  Result Date: 01/09/2018 INDICATION: 84 year old female with complex peripherally enhancing fluid collection in the right lower quadrant following complex abdominal surgery. Findings concerning for intra-abdominal abscess. She presents for image guided drain placement. EXAM: Ultrasound-guided drain placement MEDICATIONS: The patient is currently admitted to the hospital and receiving intravenous antibiotics. The antibiotics were administered within an appropriate time frame prior to the initiation of the procedure. ANESTHESIA/SEDATION: Fentanyl 25 mcg IV; Versed 1 mg IV Moderate Sedation Time:  12 minutes The patient was continuously monitored during the procedure by the interventional radiology nurse under my direct supervision. COMPLICATIONS: None immediate. PROCEDURE: Informed written consent was obtained from the patient after a thorough discussion of the procedural risks, benefits and alternatives. All questions were addressed. Maximal Sterile Barrier Technique was utilized including caps,  mask, sterile gowns, sterile gloves, sterile drape, hand hygiene and skin antiseptic. A timeout was performed prior to the initiation of the procedure. The right lower quadrant was interrogated with ultrasound. A complex fluid and gas collection is successfully and easily identified. A suitable skin entry site was selected and marked. Local anesthesia was attained by infiltration with 1% lidocaine. A small dermatotomy was made. Under real-time sonographic guidance, an 18 gauge trocar needle was carefully advanced into the fluid collection. A 0.035 Amplatz wire was then coiled in the fluid collection. The needle was removed. The skin tract was dilated to 39 [place] and a Cook 12 [place] all-purpose drainage catheter was advanced over the wire into the complex fluid collection. Aspiration yields bloody purulent fluid. This appears to be an infected hematoma. Aspiration and lavage were performed until approximately 90 mL of bloody purulent fluid was evacuated. The drain was then flushed a final time and connected to JP bulb suction before being secured to the skin with 0 Prolene suture. The patient tolerated the procedure well. IMPRESSION: Successful placement of a 12 French drainage catheter into the right lower quadrant fluid collection. The aspirate is bloody, purulent fluid most suggestive of an infected hematoma. A sample of the aspirate was sent for culture. Signed, Sterling Big, MD [facility] Radiology Electronically Signed   By: Malachy Moan M.D.   On: 01/09/2018 12:28    ASSESSMENT: 84 y.o. 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  I reassured her that I felt she was going to do well and that that was not her major problem at present.  The issues the patient is now dealing with and her confusion are not related to her carcinoid tumor about 2 postoperative complications.  In a very elderly patient something as simple as a urinary tract infection can cause confusion.  This patient has had 2 surgeries, which means 2 exposures to anesthesia, has had an abscess, as a clot, and is on multiple medications that I can cause confusion.  Accordingly my expectation is that as these problems are slowly resolved and her medications can be backed off, her cognitive dysfunction should improve and should go back to her baseline.  However I do not know what her baseline was as there was no family in the room today to tell me  As far as her carcinoid tumor is concerned, this is a low-grade tumor that in many cases requires only observation.  In other cases it is treated symptomatically with somatostatin analogs.  This requires no further intervention or evaluation at this point.  The patient has been scheduled for an outpatient appointment with our gastrointestinal tumor oncology specialist Dr. Ladene Artist on 02/04/2018, at 2 PM, at the [facility] at [facility].  I understand the patient's family is interested in prognosis.  They may be reassured that as far as the carcinoid tumor is concerned the patient's prognosis is good.  More generally I do not see an irreversible life-threatening problem in this patient at this point and  therefore support her being a full code.  I will sign off at this point.  Please let me know if I can be of further help.  In addition, at the time of discharge please make sure either the patient's family or the facility where she will be living is aware of the appointment at the cancer Center on 02/04/2018.   Theresa Howe has a good understanding of the overall plan. She agrees with it. She knows the goal of treatment in her case is cure. She will call with any problems that may develop before her next visit here.  Claiborne Rigg, MD   01/18/2018 12:18 PM Medical Oncology and Hematology [facility] [address] [place], [place] [address] Tel. [phone]    Fax. [phone]

## 2018-01-18 NOTE — Clinical Social Work Note (Addendum)
Clinical Social Work Assessment  Patient Details  Name: Theresa Howe MRN: [id number] Date of Birth: 11/02/1931  Date of referral:  01/18/18               Reason for consult:  Facility Placement, Discharge Planning, Family Concerns, Care Management Concerns                Permission sought to share information with:  Facility Contact Representative, Case Manager, Family Supports Permission granted to share information::  Yes, Verbal Permission Granted  Name::     Denise and Joseph Robertson  Agency::  SNFs  Relationship::  neice/nephew  Contact Information:     Housing/Transportation Living arrangements for the past 2 months:  Skilled Nursing Facility, Single Family Home Source of Information:  Other (Comment Required)(neice) Patient Interpreter Needed:  None Criminal Activity/Legal Involvement Pertinent to Current Situation/Hospitalization:  No - Comment as needed Significant Relationships:  Community Support, Other Family Members, Friend Lives with:  Facility Resident Do you feel safe going back to the place where you live?  Yes Need for family participation in patient care:  Yes (Comment)(decision making; mobility)  Care giving concerns:  Prior to previous hospitalization pt was living alone in a single family home, pt most recently was a resident at [facility]. Pt is chronically ill, dependent with mobility, and requires assistance for her decision making.    Social Worker assessment / plan: CSW met with pt and pt niece Denise (wife of nephew Joe Robertson who is pt's HCPOA) at bedside. Pt niece expressed confusion regarding pt care needs post discharge. Pt family attempting to make decision about SNF or home, but feel like they need more information from medical care team. CSW provided supportive listening and discussed home vs SNF with pt niece. CSW continuing to follow as pt stabilizes to plan for discharge appropriately with pt family.   Employment status:   Retired Insurance information:  Medicare PT Recommendations:  Skilled Nursing Facility, 24 Hour Supervision Information / Referral to community resources:  Skilled Nursing Facility  Patient/Family's Response to care: Pt niece states understanding of CSW role and hopes to create the safest possible discharge plan.   Patient/Family's Understanding of and Emotional Response to Diagnosis, Current Treatment, and Prognosis:Pt niece states understanding of diagnosis, but states that the family would like more information regarding current treatment and prognosis as care continues. Pt niece states that they are just trying to make the best decision and at times appeared a bit overwhelmed by the unknowns to pt's medical situation. CSW provided active listening and validation for pt family's feelings as well as support for pt family being so engaged and seeking the best discharge option.   Emotional Assessment Appearance:  Appears stated age, Other (Comment Required(oriented only to self) Attitude/Demeanor/Rapport:  Unable to Assess Affect (typically observed):  Unable to Assess Orientation:  Oriented to Self Alcohol / Substance use:  Not Applicable Psych involvement (Current and /or in the community):  No (Comment)  Discharge Needs  Concerns to be addressed:  Basic Needs, Discharge Planning Concerns, Care Coordination, Decision making concerns Readmission within the last 30 days:    Current discharge risk:  Cognitively Impaired, Chronically ill, Physical Impairment, Dependent with Mobility Barriers to Discharge:  Continued Medical Work up    H , LCSWA 01/18/2018, 12:29 PM  

## 2018-01-19 LAB — GLUCOSE, CAPILLARY
GLUCOSE-CAPILLARY: 104 mg/dL — AB (ref 65–99)
GLUCOSE-CAPILLARY: 159 mg/dL — AB (ref 65–99)
GLUCOSE-CAPILLARY: 233 mg/dL — AB (ref 65–99)
Glucose-Capillary: 229 mg/dL — ABNORMAL HIGH (ref 65–99)
Glucose-Capillary: 225 mg/dL — ABNORMAL HIGH (ref 65–99)

## 2018-01-19 LAB — BASIC METABOLIC PANEL
ANION GAP: 7 (ref 5–15)
BUN: 24 mg/dL — AB (ref 6–20)
CALCIUM: 8.3 mg/dL — AB (ref 8.9–10.3)
CO2: 26 mmol/L (ref 22–32)
Chloride: 110 mmol/L (ref 101–111)
Creatinine, Ser: 0.91 mg/dL (ref 0.44–1.00)
GFR calc Af Amer: >60 mL/min (ref >60)
GFR calc non Af Amer: 56 mL/min — ABNORMAL LOW (ref >60)
GLUCOSE: 246 mg/dL — AB (ref 65–99)
POTASSIUM: 3.7 mmol/L (ref 3.5–5.1)
Sodium: 143 mmol/L (ref 135–145)

## 2018-01-19 MED ORDER — INSULIN ASPART 100 UNIT/ML ~~LOC~~ SOLN
0.0000 [IU] | Freq: Four times a day (QID) | SUBCUTANEOUS | Status: DC
  Administered 2018-01-19: 5 [IU] via SUBCUTANEOUS
  Administered 2018-01-19 – 2018-01-20 (×3): 2 [IU] via SUBCUTANEOUS
  Administered 2018-01-20 – 2018-01-21 (×3): 3 [IU] via SUBCUTANEOUS
  Administered 2018-01-21 – 2018-01-22 (×4): 2 [IU] via SUBCUTANEOUS

## 2018-01-19 MED ORDER — DEXTROSE 70 % IV SOLN
INTRAVENOUS | Status: AC
Start: 2018-01-19 — End: 2018-01-20
  Administered 2018-01-19: 17:00:00 via INTRAVENOUS
  Filled 2018-01-19: qty 904.8

## 2018-01-19 MED ORDER — SODIUM CHLORIDE 0.9 % IV SOLN
INTRAVENOUS | Status: DC
  Administered 2018-01-19 – 2018-01-24 (×5): via INTRAVENOUS

## 2018-01-19 NOTE — Progress Notes (Signed)
Speech Language Pathology Treatment:    Patient Details Name: Theresa Howe MRN: [id number] DOB: 07-14-1931 Today's Date: 01/19/2018 Time: 4782-9562 SLP Time Calculation (min) (ACUTE ONLY): 13 min  Assessment / Plan / Recommendation Clinical Impression  Patient seen for diagnostic po trials. Patient alert and cooperative, agreeable to po intake under SLP skilled observation. Patient able to consume three sips of thin liquid via straw with what appears to be normal oropharyngeal swallowing abilities. Post intake however, patient holding chest with onset of gagging and eventual emesis of juice consumed. Suspect a primary esophageal dysphagia or other GI component. MD, consider esophagram and/or GI consult. SLP will continue to f/u for results of possible evaluation or intervention although suspect that there is not much more for SLP to offer.    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 Patient was re-admitted from SNF after she began having nausea and vomiting and unable to tolerate enteral feeding. Found to have a large dilated segment of bowel with bezoar and necrosis, now s/p SBR x 2.      SLP Plan  Continue with current plan of care       Recommendations  Medication Administration: Via alternative means Compensations: Minimize environmental distractions                Follow up Recommendations: Skilled Nursing facility SLP Visit Diagnosis: Dysphagia, oropharyngeal phase (R13.12) Plan: Continue with current plan of care       GO          [facility] MA, CCC-SLP [phone]       Theresa Howe 01/19/2018, 10:07 AM

## 2018-01-19 NOTE — Consult Note (Addendum)
[facility] Medical Consultation  Theresa Howe TDD:[id number] DOB: 01-07-1931 DOA: 12/24/2017 PCP: [name], MD   Requesting physician: Donell Beers, MD  Date of consultation:01/17/2018  Reason for consultation: generalized weakness, nausea, vomiting, failure to thrive  Impression/Recommendations Principal Problem:   Failure to thrive (0-17) Active Problems:   Diabetes with neurologic complications (HCC)   HTN (hypertension)   Glaucoma   Protein-calorie malnutrition, severe   Carcinomatosis (HCC)    Failure to thrive -  -multifactorial, possibly 2/2 intrabdominal infection, malignancy, debility of severe chronic illness -head CT no acute intracranial abnormalities -oncology consulted and is following  DM type II with hypoglycemia- -A1C 7.1 (11/28/17)  -hold SSI while on D5w -avoid hypoglycemia -continue D5w at 50cc/hr  AKI -baseline cr 0.78 -cr 1.35 on 01/18/2018.  However, the creatinine is down to 0.91 today. -Continue gentle hydration.   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 She underwent diagnostic laparoscopy and laparotomy with small bowel resection twice- on 12/04/2017 and 12/27/2017.  Patient was discharged to skilled nursing facility for rehabilitation after his first surgery, however during her stay there she developed multiple episodes of emesis and consequently  became severely dehydrated. She was readmitted on 12/27/2017 and in the hospital since then.   On 01/09/2018 she was diagnosed with the right lower quadrant abdominal abscess and underwent placement of the drain and was treated with broad-spectrum antibiotic-Zosyn.  Nevertheless patient remained, very weak unable to maintain oral intake due to persistent vomiting and difficulty swallowing.  She is now on IV hydration and TNA.  Subjective:  01/19/18: Patient seen.  The patient is not able to give a coherent history.     Past Medical History:  Diagnosis Date  . Abdominal mass 10/2017   SMALL BOWEL  . Diabetes mellitus    type 2  . Dyspnea   . Failure to thrive (0-17) 12/24/2017  . Glaucoma   . Hyperlipidemia   . Hypertension   . Neuropathy   . Osteoporosis   . Wheezing    Past Surgical History:  Procedure Laterality Date  . ABDOMINAL HYSTERECTOMY    . CATARACT EXTRACTION Right 01/2010  . COLON SURGERY    . COLONOSCOPY    . EUS N/A 04/17/2013   Procedure: UPPER ENDOSCOPIC ULTRASOUND (EUS) LINEAR;  Surgeon: Rachael Fee, MD;  Location: [facility];  Service: Endoscopy;  Laterality: N/A;  . GLAUCOMA SURGERY  2007  . IR FLUORO GUIDE CV LINE RIGHT  01/17/2018  . IR IMAGE GUIDED DRAINAGE PERCUT CATH  PERITONEAL RETROPERIT  01/09/2018  . IR SINUS/FIST TUBE CHK-NON GI  01/18/2018  . IR US GUIDE VASC ACCESS RIGHT  01/17/2018  . LAPAROSCOPIC SMALL BOWEL RESECTION  12/04/2017  . LAPAROSCOPIC SMALL BOWEL RESECTION N/A 12/04/2017   Procedure: LAPAROSCOPIC SMALL BOWEL RESECTION;  Surgeon: Almond Lint, MD;  Location: [facility] OR;  Service: General;  Laterality: N/A;  . LAPAROSCOPY N/A 12/04/2017   Procedure: LAPAROSCOPY DIAGNOSTIC;  Surgeon: Almond Lint, MD;  Location: [facility] OR;  Service: General;  Laterality: N/A;  . LAPAROSCOPY N/A 12/27/2017   Procedure: DIAGNOSTIC LAPAROSCOPY/ LAPAROTOMY WITH SMALL BOWEL RESECTION TIMES TWO;  Surgeon: Almond Lint, MD;  Location: [facility] OR;  Service: General;   Laterality: N/A;  . MULTIPLE TOOTH EXTRACTIONS    . PICC LINE INSERTION  12/24/2017   Social History:  reports that  has never smoked. she has never used smokeless tobacco. She reports that she does not drink alcohol or use drugs.  No Known Allergies History reviewed. No pertinent family history.  Prior to Admission medications   Medication Sig Start Date End Date Taking? Authorizing Provider  acetaminophen (TYLENOL) 325 MG tablet Take 2 tablets (650 mg total) by mouth every 6 (six) hours as needed for mild pain (or Fever >/= 101). 12/13/17  Yes Sherrie George, PA-C  albuterol [facility] HFA) 108 (90 Base) MCG/ACT inhaler Inhale 2 puffs into the lungs every 6 (six) hours as needed for wheezing or shortness of breath. 12/23/15  Yes [place], Velna Hatchet, MD  aspirin 81 MG tablet Take 81 mg by mouth daily.     Yes [provider]  Calcium Carbonate-Vitamin D (CALCIUM 600 + D PO) Take 1 tablet by mouth daily.    Yes [provider]  collagenase (SANTYL) ointment Apply 1 application topically daily. Left buttock   Yes [provider]  docusate sodium (COLACE) 100 MG capsule Take 1 capsule (100 mg total) by mouth 2 (two) times daily. 12/13/17  Yes Sherrie George, PA-C  dorzolamide (TRUSOPT) 2 % ophthalmic solution Place 1 drop into both eyes 2 (two) times daily. 06/02/16  Yes [place], Velna Hatchet, MD  escitalopram (LEXAPRO) 10 MG tablet TAKE 1 TABLET AT BEDTIME Patient taking differently: TAKE 5mg  AT BEDTIME 12/03/17  Yes [place], Velna Hatchet, MD  feeding supplement, ENSURE ENLIVE, (ENSURE ENLIVE) LIQD Take 237 mLs by mouth 3 (three) times daily between meals. 12/13/17  Yes Sherrie George, PA-C  fluticasone [facility]) 50 MCG/ACT nasal spray USE 2 SPRAYS IN EACH       NOSTRIL DAILY 12/24/17  Yes [place], Velna Hatchet, MD  gabapentin (NEURONTIN) 100 MG capsule TAKE 1 TO 2 CAPSULES AT    BEDTIME FOR NEUROPATHY Patient taking differently: Take 100 mg by mouth at bedtime.  12/03/17  Yes  [place], Velna Hatchet, MD  latanoprost (XALATAN) 0.005 % ophthalmic solution Place 1 drop into both eyes at bedtime. 10/01/17  Yes [place], Velna Hatchet, MD  losartan-hydrochlorothiazide (HYZAAR) 50-12.5 MG tablet Take 1 tablet daily by mouth. 10/25/17  Yes [place], Velna Hatchet, MD  metFORMIN (GLUCOPHAGE) 1000 MG tablet Take 0.5 tablets (500 mg total) by mouth 2 (two) times daily with a meal. 12/26/16  Yes [place], Velna Hatchet, MD  mirtazapine (REMERON) 15 MG tablet Take 0.5 tablets (7.5 mg total) by mouth at bedtime. For appetite 10/04/17  Yes , Velna Hatchet, MD  Multiple Vitamins-Minerals (CENTRUM SILVER PO) Take 1 tablet by mouth daily.    Yes [provider]  omeprazole (PRILOSEC) 40 MG capsule Take 40 mg by mouth daily.   Yes [provider]  ondansetron (ZOFRAN) 4 MG tablet Take 4 mg by mouth every 6 (six) hours as needed for nausea or vomiting.   Yes [provider]  oxyCODONE (ROXICODONE) 5 MG/5ML solution Take 5 mLs (5 mg total) by mouth every 6 (six) hours. Patient taking differently: Take 5 mg by mouth every 6 (six) hours as needed for severe pain.  12/13/17  Yes Sherrie George, PA-C  polyethylene glycol [facility] / GLYCOLAX) packet Take 17  g by mouth daily as needed for moderate constipation. 12/13/17  Yes Sherrie George, PA-C  alendronate (FOSAMAX) 70 MG tablet TAKE 1 TABLET EVERY 7 DAYS WITH A FULL GLASS OF WATER ON AN EMPTY STOMACH 12/25/17   [name], MD  ONE [facility] ULTRA TEST test strip TEST FASTING BLOOD SUGAR ONCE DAILY **DX E11.4 09/10/17   [name], MD   Physical Exam: Blood pressure (!) 147/70, pulse 99, temperature 98 F (36.7 C), temperature source Oral, resp. rate 17, height 5\' 5"  (1.651 m), weight 54 kg (119 lb 0.8 oz), SpO2 100 %. Vitals:   01/18/18 2005 01/19/18 0418  BP: (!) 148/68 (!) 147/70  Pulse: 87 99  Resp: 17 17  Temp: 98 F (36.7 C) 98 F (36.7 C)  SpO2: 99% 100%     General:  Very thin and frail looking.  Eyes:  Pallor, no jaundice.   ENT: normal, no lesions, mucous membranes are intact  Neck: supple, no lymphadenopathy, no thyromegaly, no masses palpated  Cardiovascular: RRR, no R/M/G  Respiratory: diminished BS, CTA bilaterally, no use of accessory muscles observed  Abdomen: flar, NT, ND, poorly audible BS; drain in place with serosanguinous fluid. Surgical dressing in mid abdomen present.  Skin: warm, dray, no lesions or rashes on limited exam, turgor diminished  Musculoskeletal: No leg edema.    Neurologic: unable to assess as the patient does not follow commands  Labs on Admission:  Basic Metabolic Panel: Recent Labs  Lab 01/14/18 0425  01/15/18 0855 01/16/18 0444 01/17/18 0647 01/18/18 0429 01/19/18 0432  NA 148*   < > 149* 150* 150* 144 143  K 3.7   < > 3.9 3.6 3.0* 3.5 3.7  CL 110   < > 113* 112* 115* 110 110  CO2 25   < > 23 25 22 25 26   GLUCOSE 185*   < > 207* 98 127* 151* 246*  BUN 25*   < > 33* 34* 28* 25* 24*  CREATININE 0.81   < > 1.13* 1.37* 1.20* 1.35* 0.91  CALCIUM 8.9   < > 8.9 8.8* 8.5* 8.1* 8.3*  MG 1.9  --   --  2.3 1.9 1.9  --   PHOS 3.6  --   --  3.5 3.3 2.8  --    < > = values in this interval not displayed.   Liver Function Tests: Recent Labs  Lab 01/14/18 0425 01/16/18 0444 01/17/18 0647  AST 31 33 25  ALT 38 40 31  ALKPHOS 186* 162* 134*  BILITOT 0.5 0.6 0.7  PROT 6.1* 6.3* 6.0*  ALBUMIN 1.9* 2.0* 1.9*   CBC: Recent Labs  Lab 01/13/18 0406 01/13/18 1756 01/14/18 0425 01/15/18 0415 01/17/18 0647  WBC 8.7 8.0 9.1 13.7* 8.1  NEUTROABS  --   --  6.3  --   --   HGB 9.0* 8.9* 8.9* 8.6* 8.4*  HCT 29.4* 28.9* 29.6* 28.8* 28.8*  MCV 85.7 86.5 86.5 89.7 89.4  PLT 991* 885* 915* 915* 746*   CBG: Recent Labs  Lab 01/18/18 2003 01/18/18 2339 01/19/18 0404 01/19/18 0753 01/19/18 1202  GLUCAP 181* 217* 229* 225* 233*    Radiological Exams on Admission: Ct Head Wo Contrast  Result Date: 01/17/2018 CLINICAL DATA:  Neuroendocrine  carcinomatosis, carcinoid syndrome. EXAM: CT HEAD WITHOUT CONTRAST TECHNIQUE: Contiguous axial images were obtained from the base of the skull through the vertex without intravenous contrast. COMPARISON:  CT HEAD April 14, 2015 FINDINGS: BRAIN: No intraparenchymal hemorrhage, mass  effect nor midline shift. Moderate to severe parenchymal brain volume loss, progressed from 2016. No hydrocephalus. Patchy supratentorial white matter hypodensities less than expected for patient's age, though non-specific are most compatible with chronic small vessel ischemic disease. No acute large vascular territory infarcts. No abnormal extra-axial fluid collections. Basal cisterns are patent. VASCULAR: Moderate to severe calcific atherosclerosis of the carotid siphons. SKULL: No skull fracture. No significant scalp soft tissue swelling. SINUSES/ORBITS: The mastoid air-cells and included paranasal sinuses are well-aerated.The included ocular globes and orbital contents are non-suspicious. Status post RIGHT ocular lens implant. OTHER: None. IMPRESSION: 1. No acute intracranial process. 2. Moderate to severe parenchymal brain volume loss and mild chronic small vessel ischemic disease. Electronically Signed   By: Awilda Metro M.D.   On: 01/17/2018 19:14   Ir Sinus/fist Tube Chk-non Gi  Result Date: 01/18/2018 CLINICAL DATA:  History of intestinal surgery complicated by postoperative fluid collection post ultrasound-guided drainage catheter placement on 01/09/2018 Postprocedural CT scan of the abdomen pelvis demonstrates significant reduction in the size of the residual lower abdominal/pelvic fluid collection and as such, request made for drainage catheter injection prior to potential removal. The drainage catheter continues to put out approximately 30-50 cc of blood tinged fluid per day. EXAM: SINUS TRACT INJECTION/FISTULOGRAM COMPARISON:  CT abdomen and pelvis - 01/16/2018; 01/07/2018; ultrasound-guided drainage catheter  placement-01/09/2018 CONTRAST:  10 cc Isovue-300 FLUOROSCOPY TIME:  42 seconds TECHNIQUE: The patient was positioned supine on the fluoroscopy table. A preprocedural spot fluoroscopic image was obtained of the right lower abdomen/pelvis and existing percutaneous drainage catheter Multiple spot fluoroscopic and radiographic images were obtained following the injection of a small amount of contrast via the existing percutaneous drainage catheter. Multiple spot fluoroscopic and radiographic images were obtained from the injection of a small amount of contrast via the existing percutaneous drainage catheter Images reviewed and discussed with referring surgeon, Dr. Donell Beers and the decision was made to maintain the drainage catheter in place at this time. As such, the drainage catheter was flushed with a small amount of saline and reconnected to a JP bulb. FINDINGS: Preprocedural spot fluoroscopic image demonstrates unchanged positioning of the percutaneous drainage catheter with end coiled and locked over the right hemipelvis. Contrast injection demonstrates opacification of a moderate-sized residual decompressed abscess cavity without definitive fistulous communication to adjacent intestines. There is minimal reflux of contrast along the catheter tract to the entrance site at the skin surface. IMPRESSION: 1. Appropriately positioned and functioning percutaneous drainage catheter. 2. Opacification of a moderate-sized residual decompressed abscess cavity without definitive fistulous connection. PLAN: - Per my discussion with Dr. Donell Beers, given persistent output from the moderate-sized decompressed abscess cavity, the decision was made to maintain the drainage catheter in place at this time. - Orders were placed to no longer flush the percutaneous drainage catheter but to maintain diligent records regarding daily drainage catheter output. - If and when the output has reduced to less than 10 cc per day the drainage catheter  could be removed at that time either with or without repeat fluoroscopic guided injection. Electronically Signed   By: Simonne Come M.D.   On: 01/18/2018 14:33    EKG: normal tracing on EKG from12/12/18 Time spent: 50  Barnetta Chapel, MD [facility] Pager [phone]  If 7PM-7AM, please contact night-coverage [email or web address] Password [email or web address] 01/19/2018, 2:16 PM

## 2018-01-19 NOTE — Progress Notes (Signed)
Referring Physician(s): Dr Mamie Laurel  Supervising Physician: Sandi Mariscal  Patient Status:  [facility] - In-pt  Chief Complaint:  RLQ abscess drain placed 1/23  Subjective:  2/1 drain injection IMPRESSION: 1. Appropriately positioned and functioning percutaneous drainage catheter. 2. Opacification of a moderate-sized residual decompressed abscess cavity without definitive fistulous connection.  PLAN: - Per my discussion with Dr. Barry Dienes, given persistent output from the moderate-sized decompressed abscess cavity, the decision was made to maintain the drainage catheter in place at this time. - Orders were placed to no longer flush the percutaneous drainage catheter but to maintain diligent records regarding daily drainage catheter output. - If and when the output has reduced to less than 10 cc per day the drainage catheter could be removed at that time either with or without repeat fluoroscopic guided injection.  15 cc OP yesterday nminimal in JP now---blood tinged  Allergies: Patient has no known allergies.  Medications: Prior to Admission medications   Medication Sig Start Date End Date Taking? Authorizing Provider  acetaminophen (TYLENOL) 325 MG tablet Take 2 tablets (650 mg total) by mouth every 6 (six) hours as needed for mild pain (or Fever >/= 101). 12/13/17  Yes Earnstine Regal, PA-C  albuterol [facility] HFA) 108 (90 Base) MCG/ACT inhaler Inhale 2 puffs into the lungs every 6 (six) hours as needed for wheezing or shortness of breath. 12/23/15  Yes [place], Modena Nunnery, MD  aspirin 81 MG tablet Take 81 mg by mouth daily.     Yes [provider]  Calcium Carbonate-Vitamin D (CALCIUM 600 + D PO) Take 1 tablet by mouth daily.    Yes [provider]  collagenase (SANTYL) ointment Apply 1 application topically daily. Left buttock   Yes [provider]  docusate sodium (COLACE) 100 MG capsule Take 1 capsule (100 mg total) by mouth 2 (two) times daily.  12/13/17  Yes Earnstine Regal, PA-C  dorzolamide (TRUSOPT) 2 % ophthalmic solution Place 1 drop into both eyes 2 (two) times daily. 06/02/16  Yes [place], Modena Nunnery, MD  escitalopram (LEXAPRO) 10 MG tablet TAKE 1 TABLET AT BEDTIME Patient taking differently: TAKE 5mg  AT BEDTIME 12/03/17  Yes [place], Modena Nunnery, MD  feeding supplement, ENSURE ENLIVE, (ENSURE ENLIVE) LIQD Take 237 mLs by mouth 3 (three) times daily between meals. 12/13/17  Yes Earnstine Regal, PA-C  fluticasone [facility]) 50 MCG/ACT nasal spray USE 2 SPRAYS IN EACH       NOSTRIL DAILY 12/24/17  Yes [place], Modena Nunnery, MD  gabapentin (NEURONTIN) 100 MG capsule TAKE 1 TO 2 CAPSULES AT    BEDTIME FOR NEUROPATHY Patient taking differently: Take 100 mg by mouth at bedtime.  12/03/17  Yes [place], Modena Nunnery, MD  latanoprost (XALATAN) 0.005 % ophthalmic solution Place 1 drop into both eyes at bedtime. 10/01/17  Yes [place], Modena Nunnery, MD  losartan-hydrochlorothiazide (HYZAAR) 50-12.5 MG tablet Take 1 tablet daily by mouth. 10/25/17  Yes [place], Modena Nunnery, MD  metFORMIN (GLUCOPHAGE) 1000 MG tablet Take 0.5 tablets (500 mg total) by mouth 2 (two) times daily with a meal. 12/26/16  Yes [place], Modena Nunnery, MD  mirtazapine (REMERON) 15 MG tablet Take 0.5 tablets (7.5 mg total) by mouth at bedtime. For appetite 10/04/17  Yes [place], Modena Nunnery, MD  Multiple Vitamins-Minerals (CENTRUM SILVER PO) Take 1 tablet by mouth daily.    Yes [provider]  omeprazole (PRILOSEC) 40 MG capsule Take 40 mg by mouth daily.   Yes [provider]  ondansetron (ZOFRAN) 4 MG  tablet Take 4 mg by mouth every 6 (six) hours as needed for nausea or vomiting.   Yes [provider]  oxyCODONE (ROXICODONE) 5 MG/5ML solution Take 5 mLs (5 mg total) by mouth every 6 (six) hours. Patient taking differently: Take 5 mg by mouth every 6 (six) hours as needed for severe pain.  12/13/17  Yes Earnstine Regal, PA-C  polyethylene glycol [facility] / GLYCOLAX) packet  Take 17 g by mouth daily as needed for moderate constipation. 12/13/17  Yes Earnstine Regal, PA-C  alendronate (FOSAMAX) 70 MG tablet TAKE 1 TABLET EVERY 7 DAYS WITH A FULL GLASS OF WATER ON AN EMPTY STOMACH 12/25/17   Alycia Rossetti, MD  ONE TOUCH ULTRA TEST test strip TEST FASTING BLOOD SUGAR ONCE DAILY **DX E11.4 09/10/17   Alycia Rossetti, MD     Vital Signs: BP (!) 147/70   Pulse 99   Temp 98 F (36.7 C) (Oral)   Resp 17   Ht 5\' 5"  (1.651 m) Comment: 12/04/17  Wt 119 lb 0.8 oz (54 kg)   SpO2 100%   BMI 19.81 kg/m   Physical Exam  Abdominal: Soft.  Skin: Skin is warm and dry.  Site is clean and dry NT no bleeding OP bloody Minimal in JP 15 cc yesterday  Nursing note and vitals reviewed.   Imaging: Ct Abdomen Pelvis Wo Contrast  Result Date: 01/16/2018 CLINICAL DATA:  Lower abdominal pain. Patient would not drink contrast secondary to confusion. EXAM: CT ABDOMEN AND PELVIS WITHOUT CONTRAST TECHNIQUE: Multidetector CT imaging of the abdomen and pelvis was performed following the standard protocol without IV contrast. COMPARISON:  CT 01/07/2018 FINDINGS: Lower chest: Lung bases are clear.  No pleural or pericardial fluid. Hepatobiliary: Normal Pancreas: Normal Spleen: Normal Adrenals/Urinary Tract: Adrenal glands are normal. Kidneys are normal except for a hyperdense cyst in the right midportion measuring 15 mm in diameter, inapparent on the previous contrast-enhanced study. Stomach/Bowel: No evidence of bowel obstruction. Drainage catheter in the right pelvis with good drainage of the previously seen large right-sided abscess. Small amount of residual material in communication with the catheter. Previous surgery at the right ileocecal junction region. 2 cm well-circumscribed hyperdense focus adjacent to the ileocecal valve not present previously. The differential diagnosis for this includes postprocedure hematoma versus rapid recurrence of tumor versus small leak collection with  hyperdensity secondary to previously administered contrast. Vascular/Lymphatic: Mild aortic atherosclerosis. Reproductive: Previous hysterectomy.  Pessary in place. Other: No free fluid or air. Musculoskeletal: Chronic degenerative changes of the lumbar spine. IMPRESSION: Further continued decrease in right pelvic abscess. This appears completely drained presently, with only a small amount of density surrounding the drainage catheter. New demonstration of 2 small hyperdense areas in the right lower quadrant adjacent to the anastomosis/drainage catheter, the larger measuring 2 cm in diameter. The differential diagnosis is postprocedural hematoma versus rapid recurrence of tumor versus is small leak collections. These warrant additional follow-up. Electronically Signed   By: Nelson Chimes M.D.   On: 01/16/2018 14:43   Ct Head Wo Contrast  Result Date: 01/17/2018 CLINICAL DATA:  Neuroendocrine carcinomatosis, carcinoid syndrome. EXAM: CT HEAD WITHOUT CONTRAST TECHNIQUE: Contiguous axial images were obtained from the base of the skull through the vertex without intravenous contrast. COMPARISON:  CT HEAD April 14, 2015 FINDINGS: BRAIN: No intraparenchymal hemorrhage, mass effect nor midline shift. Moderate to severe parenchymal brain volume loss, progressed from 2016. No hydrocephalus. Patchy supratentorial white matter hypodensities less than expected for patient's age, though non-specific are most  compatible with chronic small vessel ischemic disease. No acute large vascular territory infarcts. No abnormal extra-axial fluid collections. Basal cisterns are patent. VASCULAR: Moderate to severe calcific atherosclerosis of the carotid siphons. SKULL: No skull fracture. No significant scalp soft tissue swelling. SINUSES/ORBITS: The mastoid air-cells and included paranasal sinuses are well-aerated.The included ocular globes and orbital contents are non-suspicious. Status post RIGHT ocular lens implant. OTHER: None.  IMPRESSION: 1. No acute intracranial process. 2. Moderate to severe parenchymal brain volume loss and mild chronic small vessel ischemic disease. Electronically Signed   By: Elon Alas M.D.   On: 01/17/2018 19:14   Ir Sinus/fist Tube Chk-non Gi  Result Date: 01/18/2018 CLINICAL DATA:  History of intestinal surgery complicated by postoperative fluid collection post ultrasound-guided drainage catheter placement on 01/09/2018 Postprocedural CT scan of the abdomen pelvis demonstrates significant reduction in the size of the residual lower abdominal/pelvic fluid collection and as such, request made for drainage catheter injection prior to potential removal. The drainage catheter continues to put out approximately 30-50 cc of blood tinged fluid per day. EXAM: SINUS TRACT INJECTION/FISTULOGRAM COMPARISON:  CT abdomen and pelvis - 01/16/2018; 01/07/2018; ultrasound-guided drainage catheter placement-01/09/2018 CONTRAST:  10 cc Isovue-300 FLUOROSCOPY TIME:  42 seconds TECHNIQUE: The patient was positioned supine on the fluoroscopy table. A preprocedural spot fluoroscopic image was obtained of the right lower abdomen/pelvis and existing percutaneous drainage catheter Multiple spot fluoroscopic and radiographic images were obtained following the injection of a small amount of contrast via the existing percutaneous drainage catheter. Multiple spot fluoroscopic and radiographic images were obtained from the injection of a small amount of contrast via the existing percutaneous drainage catheter Images reviewed and discussed with referring surgeon, Dr. Barry Dienes and the decision was made to maintain the drainage catheter in place at this time. As such, the drainage catheter was flushed with a small amount of saline and reconnected to a JP bulb. FINDINGS: Preprocedural spot fluoroscopic image demonstrates unchanged positioning of the percutaneous drainage catheter with end coiled and locked over the right hemipelvis.  Contrast injection demonstrates opacification of a moderate-sized residual decompressed abscess cavity without definitive fistulous communication to adjacent intestines. There is minimal reflux of contrast along the catheter tract to the entrance site at the skin surface. IMPRESSION: 1. Appropriately positioned and functioning percutaneous drainage catheter. 2. Opacification of a moderate-sized residual decompressed abscess cavity without definitive fistulous connection. PLAN: - Per my discussion with Dr. Barry Dienes, given persistent output from the moderate-sized decompressed abscess cavity, the decision was made to maintain the drainage catheter in place at this time. - Orders were placed to no longer flush the percutaneous drainage catheter but to maintain diligent records regarding daily drainage catheter output. - If and when the output has reduced to less than 10 cc per day the drainage catheter could be removed at that time either with or without repeat fluoroscopic guided injection. Electronically Signed   By: Sandi Mariscal M.D.   On: 01/18/2018 14:33   Ir Fluoro Guide Cv Line Right  Result Date: 01/17/2018 INDICATION: 84 year old female with a history bowel resection, poor nutrition, need for parental nutrition EXAM: IMAGE GUIDED TUNNELED CENTRAL CATHETER MEDICATIONS: None ANESTHESIA/SEDATION: Moderate (conscious) sedation was employed during this procedure. A total of Versed 1.0 mg and Fentanyl 50 mcg was administered intravenously. Moderate Sedation Time: 13 minutes. The patient's level of consciousness and vital signs were monitored continuously by radiology nursing throughout the procedure under my direct supervision. FLUOROSCOPY TIME:  Fluoroscopy Time: 0 minutes 6 seconds (0 mGy). COMPLICATIONS:  None PROCEDURE: After written informed consent was obtained, patient was placed in the supine position on angiographic table. Patency of the right internal jugular vein was confirmed with ultrasound with image  documentation. Patient was prepped and draped in the usual sterile fashion including the right neck and right superior chest. Using ultrasound guidance, the skin and subcutaneous tissues overlying the right internal jugular vein were generously infiltrated with 1% lidocaine without epinephrine. Using ultrasound guidance, the right internal jugular vein was punctured with a micropuncture needle, and an 018 wire was advanced into the right heart confirming venous access. A small stab incision was made with an 11 blade scalpel. Peel-away sheath was placed over the wire, and then the wire was removed, marking the wire for estimation of internal catheter length. The chest wall was then generously infiltrated with 1% lidocaine for local anesthesia along the tissue tract. Small stab incision was made with 11 blade scalpel, and then the catheter was back tunneled to the puncture site at the right internal jugular vein. Catheter was pulled through the tract, with the catheter amputated at 20 cm. Catheter was advanced through the peel-away sheath, and the peel-away sheath was removed. Final image was stored. The catheter was anchored to the chest wall with 2 retention sutures, and Derma bond was used to seal the right internal jugular vein incision site and at the right chest wall. Patient tolerated the procedure well and remained hemodynamically stable throughout. No complications were encountered and no significant blood loss was encountered. IMPRESSION: Status post right IJ tunneled central catheter. Signed, Dulcy Fanny. Earleen Newport, DO [facility] Radiology Electronically Signed   By: Corrie Mckusick D.O.   On: 01/17/2018 09:45   Ir US Guide Vasc Access Right  Result Date: 01/17/2018 INDICATION: 84 year old female with a history bowel resection, poor nutrition, need for parental nutrition EXAM: IMAGE GUIDED TUNNELED CENTRAL CATHETER MEDICATIONS: None ANESTHESIA/SEDATION: Moderate  (conscious) sedation was employed during this procedure. A total of Versed 1.0 mg and Fentanyl 50 mcg was administered intravenously. Moderate Sedation Time: 13 minutes. The patient's level of consciousness and vital signs were monitored continuously by radiology nursing throughout the procedure under my direct supervision. FLUOROSCOPY TIME:  Fluoroscopy Time: 0 minutes 6 seconds (0 mGy). COMPLICATIONS: None PROCEDURE: After written informed consent was obtained, patient was placed in the supine position on angiographic table. Patency of the right internal jugular vein was confirmed with ultrasound with image documentation. Patient was prepped and draped in the usual sterile fashion including the right neck and right superior chest. Using ultrasound guidance, the skin and subcutaneous tissues overlying the right internal jugular vein were generously infiltrated with 1% lidocaine without epinephrine. Using ultrasound guidance, the right internal jugular vein was punctured with a micropuncture needle, and an 018 wire was advanced into the right heart confirming venous access. A small stab incision was made with an 11 blade scalpel. Peel-away sheath was placed over the wire, and then the wire was removed, marking the wire for estimation of internal catheter length. The chest wall was then generously infiltrated with 1% lidocaine for local anesthesia along the tissue tract. Small stab incision was made with 11 blade scalpel, and then the catheter was back tunneled to the puncture site at the right internal jugular vein. Catheter was pulled through the tract, with the catheter amputated at 20 cm. Catheter was advanced through the peel-away sheath, and the peel-away sheath was removed. Final image was stored. The catheter was anchored to the chest  wall with 2 retention sutures, and Derma bond was used to seal the right internal jugular vein incision site and at the right chest wall. Patient tolerated the procedure well  and remained hemodynamically stable throughout. No complications were encountered and no significant blood loss was encountered. IMPRESSION: Status post right IJ tunneled central catheter. Signed, Dulcy Fanny. Earleen Newport, DO [facility] Radiology Electronically Signed   By: Corrie Mckusick D.O.   On: 01/17/2018 09:45    Labs:  CBC: Recent Labs    01/13/18 1756 01/14/18 0425 01/15/18 0415 01/17/18 0647  WBC 8.0 9.1 13.7* 8.1  HGB 8.9* 8.9* 8.6* 8.4*  HCT 28.9* 29.6* 28.8* 28.8*  PLT 885* 915* 915* 746*    COAGS: Recent Labs    11/28/17 1220 12/24/17 1301 01/08/18 1452 01/17/18 0718  INR 1.08 1.25 1.19 1.25    BMP: Recent Labs    01/16/18 0444 01/17/18 0647 01/18/18 0429 01/19/18 0432  NA 150* 150* 144 143  K 3.6 3.0* 3.5 3.7  CL 112* 115* 110 110  CO2 25 22 25 26   GLUCOSE 98 127* 151* 246*  BUN 34* 28* 25* 24*  CALCIUM 8.8* 8.5* 8.1* 8.3*  CREATININE 1.37* 1.20* 1.35* 0.91  GFRNONAA 34* 40* 35* 56*  GFRAA 40* 47* 41* >60    LIVER FUNCTION TESTS: Recent Labs    01/10/18 0401 01/14/18 0425 01/16/18 0444 01/17/18 0647  BILITOT 0.6 0.5 0.6 0.7  AST 27 31 33 25  ALT 32 38 40 31  ALKPHOS 182* 186* 162* 134*  PROT 5.7* 6.1* 6.3* 6.0*  ALBUMIN 1.7* 1.9* 2.0* 1.9*    Assessment and Plan:  RLQ abscess drain intact OP lessening Still blood tinged Will follow Plan per Dr Barry Dienes When OP less than 10 cc daily -- without flushes-- consider removal- with or without drain injection per Dr Pascal Lux note yesterday  Electronically Signed: Rosa Wyly A, PA-C 01/19/2018, 9:40 AM   I spent a total of 15 Minutes at the the patient's bedside AND on the patient's hospital floor or unit, greater than 50% of which was counseling/coordinating care for RLQ abscess drain

## 2018-01-19 NOTE — Progress Notes (Signed)
PHARMACY - ADULT TOTAL PARENTERAL NUTRITION CONSULT NOTE   Pharmacy Consult: TPN Indication: Intolerance to enteral feeding  Patient Measurements: Height: '5\' 5"'  (165.1 cm)(12/04/17) Weight: 119 lb 0.8 oz (54 kg) IBW/kg (Calculated) : 57 TPN AdjBW (KG): 57.6 Body mass index is 19.81 kg/m.  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  Patient was re-admitted from SNF after she began having nausea and vomiting and unable to tolerate enteral feeding.  Found to have a large dilated segment of bowel with bezoar and necrosis, now s/p SBR x 2.  Pharmacy managing TPN for nutritional support.  GI: Albumin low at 1.9. Prealbumin up to 27.3 (BL 5.3), having BMs, RLQ drain O/P is minimal. PPI. PRN Zofran/Phenergan with continued N/V. Possible choledocholithiasis on CT, repeat CT 1/30 - postprocedural hematoma vs rapid recurrence of tumor vs small leak collections. Endo: DM on metformin PTA. CBGs labile (50-200s). Low of 59 was before TPN started. Now CBGs trending up to mid 200s. MD d/c'd SSI yesterday afternoon? Insulin requirements in the past 24 hours: 2 units SSI + 25 units in TPN  Lytes: all wnl today. K continues to trend up. Phos and Mg ok. CoCa 9.9 Renal: SCr down to 0.91, BUN down to 24 - no UOP charted. Net +9.2L since admit. Pulm: stable on RA Cards: BP variable, HR 100s - asa AC: Lovenox acute nonocclusive DVT - hgb low but stable, plts down to 915. Vaginal bleeding 1/27 and continuing, so Lovenox reduced to 26m SQ Q12H by MD, now to 531mSQ q24h with CrCl<30. Hepatobil: Alk phos elevated but trending down, AST/ALT/tbili WNL. TG up to 172 Neuro: confused - Lexapro, Remeron, s/p Haldol for sundowning (QTc [id number] morphine prn ID: S/p Zosyn for IAI. 1/21 CT showed abscess (cx with mult organisms, none predominant) - afebrile, WBC down to wnl  TPN Access: PICC placed 12/24/17>> pulled out 1/17; replaced 01/04/18>>pulled out again 1/30,  replace with R IJ 1/31>> TPN start date: 12/25/17  Nutritional Goals (per RD rec on 1/31): 1500-1700 kCal  75-90 gram protein per day > 1.5 L of fluid per day  Current Nutrition:  TPN NPO Boost Breeze TID (provides 250 kcal + 9 grams protein for each supplement - refusing all doses currently)  Plan:  Continue TPN at 65 ml/hr  TPN will provide 90g AA, 234g CHO, and 37g of fat emulsion, which equals 1,532 kCals per day, meeting 100% of patient needs Electrolytes in TPN: continue low Ca and slightly higher Na + K. Max acetate  Daily multivitamin and trace elements in TPN Restart sensitive SSI Q6H + continue insulin in TPN at 25 units [facility] at 53m52m per MD Monitor TPN labs F/U PO intake/diet advancement and ability to wean TPN   NathElenor QuinonesarmD, BCPS Clinical Pharmacist Pager [phone] 7:58 AM

## 2018-01-19 NOTE — Progress Notes (Signed)
23 Days Post-Op  Subjective: Stable and alert.  No agitation.  Disoriented to place and situation.  Has had no new problems.  appreciate internal medicine consult CT brain shows no acute intracranial process but there is severe parenchymal brain loss and small vessel ischemic disease  Appreciate oncology consult.scheduled for outpatient oncology consult with Dr. Benay Spice  on 02/04/2018  Objective: Vital signs in last 24 hours: Temp:  [98 F (36.7 C)] 98 F (36.7 C) (02/02 0418) Pulse Rate:  [87-99] 99 (02/02 0418) Resp:  [17] 17 (02/02 0418) BP: (147-148)/(68-70) 147/70 (02/02 0418) SpO2:  [99 %-100 %] 100 % (02/02 0418) Last BM Date: 01/17/18  Intake/Output from previous day: 02/01 0701 - 02/02 0700 In: 2080.6 [I.V.:2080.6] Out: 15 [Drains:15] Intake/Output this shift: Total I/O In: 768.1 [I.V.:768.1] Out: 5 [Drains:5]  PE General appearance: remains  confused .  Not agitated Resp: breathing comfortably GI: soft, non-tender; RLQ drain with bloody drainage around insertion site.  Drain output serous.       Lab Results:  Results for orders placed or performed during the hospital encounter of 12/24/17 (from the past 24 hour(s))  Glucose, capillary     Status: Abnormal   Collection Time: 01/18/18  7:45 AM  Result Value Ref Range   Glucose-Capillary 140 (H) 65 - 99 mg/dL  Glucose, capillary     Status: None   Collection Time: 01/18/18 12:21 PM  Result Value Ref Range   Glucose-Capillary 94 65 - 99 mg/dL  Glucose, capillary     Status: Abnormal   Collection Time: 01/18/18  4:46 PM  Result Value Ref Range   Glucose-Capillary 138 (H) 65 - 99 mg/dL  Glucose, capillary     Status: Abnormal   Collection Time: 01/18/18  8:03 PM  Result Value Ref Range   Glucose-Capillary 181 (H) 65 - 99 mg/dL  Glucose, capillary     Status: Abnormal   Collection Time: 01/18/18 11:39 PM  Result Value Ref Range   Glucose-Capillary 217 (H) 65 - 99 mg/dL  Glucose, capillary     Status:  Abnormal   Collection Time: 01/19/18  4:04 AM  Result Value Ref Range   Glucose-Capillary 229 (H) 65 - 99 mg/dL  Basic metabolic panel     Status: Abnormal   Collection Time: 01/19/18  4:32 AM  Result Value Ref Range   Sodium 143 135 - 145 mmol/L   Potassium 3.7 3.5 - 5.1 mmol/L   Chloride 110 101 - 111 mmol/L   CO2 26 22 - 32 mmol/L   Glucose, Bld 246 (H) 65 - 99 mg/dL   BUN 24 (H) 6 - 20 mg/dL   Creatinine, Ser 0.91 0.44 - 1.00 mg/dL   Calcium 8.3 (L) 8.9 - 10.3 mg/dL   GFR calc non Af Amer 56 (L) >60 mL/min   GFR calc Af Amer >60 >60 mL/min   Anion gap 7 5 - 15     Studies/Results: Ir Sinus/fist Tube Chk-non Gi  Result Date: 01/18/2018 CLINICAL DATA:  History of intestinal surgery complicated by postoperative fluid collection post ultrasound-guided drainage catheter placement on 01/09/2018 Postprocedural CT scan of the abdomen pelvis demonstrates significant reduction in the size of the residual lower abdominal/pelvic fluid collection and as such, request made for drainage catheter injection prior to potential removal. The drainage catheter continues to put out approximately 30-50 cc of blood tinged fluid per day. EXAM: SINUS TRACT INJECTION/FISTULOGRAM COMPARISON:  CT abdomen and pelvis - 01/16/2018; 01/07/2018; ultrasound-guided drainage catheter placement-01/09/2018 CONTRAST:  10  cc Isovue-300 FLUOROSCOPY TIME:  42 seconds TECHNIQUE: The patient was positioned supine on the fluoroscopy table. A preprocedural spot fluoroscopic image was obtained of the right lower abdomen/pelvis and existing percutaneous drainage catheter Multiple spot fluoroscopic and radiographic images were obtained following the injection of a small amount of contrast via the existing percutaneous drainage catheter. Multiple spot fluoroscopic and radiographic images were obtained from the injection of a small amount of contrast via the existing percutaneous drainage catheter Images reviewed and discussed with  referring surgeon, Dr. Barry Dienes and the decision was made to maintain the drainage catheter in place at this time. As such, the drainage catheter was flushed with a small amount of saline and reconnected to a JP bulb. FINDINGS: Preprocedural spot fluoroscopic image demonstrates unchanged positioning of the percutaneous drainage catheter with end coiled and locked over the right hemipelvis. Contrast injection demonstrates opacification of a moderate-sized residual decompressed abscess cavity without definitive fistulous communication to adjacent intestines. There is minimal reflux of contrast along the catheter tract to the entrance site at the skin surface. IMPRESSION: 1. Appropriately positioned and functioning percutaneous drainage catheter. 2. Opacification of a moderate-sized residual decompressed abscess cavity without definitive fistulous connection. PLAN: - Per my discussion with Dr. Barry Dienes, given persistent output from the moderate-sized decompressed abscess cavity, the decision was made to maintain the drainage catheter in place at this time. - Orders were placed to no longer flush the percutaneous drainage catheter but to maintain diligent records regarding daily drainage catheter output. - If and when the output has reduced to less than 10 cc per day the drainage catheter could be removed at that time either with or without repeat fluoroscopic guided injection. Electronically Signed   By: Sandi Mariscal M.D.   On: 01/18/2018 14:33    . aspirin  81 mg Oral Daily  . chlorhexidine  15 mL Mouth Rinse BID  . collagenase  1 application Topical Daily  . dorzolamide  1 drop Both Eyes BID  . enoxaparin (LOVENOX) injection  1 mg/kg Subcutaneous Q24H  . escitalopram  5 mg Oral QHS  . feeding supplement  1 Container Oral TID BM  . fluticasone  2 spray Each Nare Daily  . latanoprost  1 drop Both Eyes QHS  . mouth rinse  15 mL Mouth Rinse q12n4p  . mirtazapine  7.5 mg Oral QHS  . pantoprazole (PROTONIX) IV  40  mg Intravenous QHS  . sodium chloride flush  5 mL Intravenous Q8H     Assessment/Plan: s/p Procedure(s): DIAGNOSTIC LAPAROSCOPY/ LAPAROTOMY WITH SMALL BOWEL RESECTION TIMES TWO  Drain for fluid collection. multiple organisms, none predominant. Completed 14 day course of antibiotics, now discontinued Repeat CT(1/30)  shows near resolution of other abscess.no enteric connection. ? Tumor nodules vs small abscesses.  leave drain in place for now. DVT on prophylactic dose lovenox, once HCT more stable, will change back to therapeutic dosing.CBC today Continue TPN - calorie counts are pretty much zero.  Prealbumin came up. Disposition planning    @PROBHOSP @  LOS: 26 days    Adin Hector 01/19/2018  . .prob

## 2018-01-20 LAB — GLUCOSE, CAPILLARY
GLUCOSE-CAPILLARY: 146 mg/dL — AB (ref 65–99)
GLUCOSE-CAPILLARY: 166 mg/dL — AB (ref 65–99)
GLUCOSE-CAPILLARY: 175 mg/dL — AB (ref 65–99)
Glucose-Capillary: 161 mg/dL — ABNORMAL HIGH (ref 65–99)
Glucose-Capillary: 208 mg/dL — ABNORMAL HIGH (ref 65–99)
Glucose-Capillary: 160 mg/dL — ABNORMAL HIGH (ref 65–99)

## 2018-01-20 LAB — CBC
HEMATOCRIT: 28.2 % — AB (ref 36.0–46.0)
Hemoglobin: 8.6 g/dL — ABNORMAL LOW (ref 12.0–15.0)
MCH: 27.3 pg (ref 26.0–34.0)
MCHC: 30.5 g/dL (ref 30.0–36.0)
MCV: 89.5 fL (ref 78.0–100.0)
Platelets: 690 10*3/uL — ABNORMAL HIGH (ref 150–400)
RBC: 3.15 MIL/uL — ABNORMAL LOW (ref 3.87–5.11)
RDW: 20.3 % — AB (ref 11.5–15.5)
WBC: 7.4 10*3/uL (ref 4.0–10.5)

## 2018-01-20 MED ORDER — TRAVASOL 10 % IV SOLN
INTRAVENOUS | Status: AC
  Administered 2018-01-20: 17:00:00 via INTRAVENOUS
  Filled 2018-01-20: qty 904.8

## 2018-01-20 NOTE — Progress Notes (Signed)
Chart reviewed and patient assessment completed. Ms. Kreher wakes to voice. Denies pain. Pleasantly confused. No family at bedside. I have left a vm for nephew, Artelia Laroche. Awaiting call back. Will arrange [place] when family is available.   NO CHARGE  Ihor Dow, FNP-C Palliative Medicine Team  Phone: [phone] Fax: [phone]

## 2018-01-20 NOTE — Progress Notes (Signed)
24 Days Post-Op  Subjective: Pleasant and appropriate but withdrawn and curled up in bed Significant deconditioning Disoriented to place and situation No complaints had a bowel movement  Objective: Vital signs in last 24 hours: Temp:  [97.2 F (36.2 C)-98.6 F (37 C)] 97.2 F (36.2 C) (02/03 0516) Pulse Rate:  [92-98] 93 (02/03 0516) Resp:  [18-21] 18 (02/03 0516) BP: (139-164)/(71-85) 152/85 (02/03 0516) SpO2:  [97 %-100 %] 100 % (02/03 0516) Last BM Date: 01/19/18  Intake/Output from previous day: 02/02 0701 - 02/03 0700 In: 1746.2 [P.O.:40; I.V.:1696.2] Out: 25 [Drains:25] Intake/Output this shift: No intake/output data recorded.  PE General appearance: remains confused .  Not agitated.very deconditioned.  Malnourished. Resp: breathing comfortably GI: soft, non-tender; RLQ drain with bloody drainage around insertion site. Drain output serous.       Lab Results:  Results for orders placed or performed during the hospital encounter of 12/24/17 (from the past 24 hour(s))  Glucose, capillary     Status: Abnormal   Collection Time: 01/19/18 12:02 PM  Result Value Ref Range   Glucose-Capillary 233 (H) 65 - 99 mg/dL  Glucose, capillary     Status: Abnormal   Collection Time: 01/19/18  3:58 PM  Result Value Ref Range   Glucose-Capillary 104 (H) 65 - 99 mg/dL  Glucose, capillary     Status: Abnormal   Collection Time: 01/19/18 11:40 PM  Result Value Ref Range   Glucose-Capillary 159 (H) 65 - 99 mg/dL  Glucose, capillary     Status: Abnormal   Collection Time: 01/20/18  5:21 AM  Result Value Ref Range   Glucose-Capillary 161 (H) 65 - 99 mg/dL  Glucose, capillary     Status: Abnormal   Collection Time: 01/20/18  7:41 AM  Result Value Ref Range   Glucose-Capillary 175 (H) 65 - 99 mg/dL     Studies/Results: No results found.  Marland Kitchen aspirin  81 mg Oral Daily  . chlorhexidine  15 mL Mouth Rinse BID  . collagenase  1 application Topical Daily  . dorzolamide  1 drop  Both Eyes BID  . enoxaparin (LOVENOX) injection  1 mg/kg Subcutaneous Q24H  . escitalopram  5 mg Oral QHS  . feeding supplement  1 Container Oral TID BM  . fluticasone  2 spray Each Nare Daily  . insulin aspart  0-9 Units Subcutaneous Q6H  . latanoprost  1 drop Both Eyes QHS  . mouth rinse  15 mL Mouth Rinse q12n4p  . mirtazapine  7.5 mg Oral QHS  . pantoprazole (PROTONIX) IV  40 mg Intravenous QHS  . sodium chloride flush  5 mL Intravenous Q8H     Assessment/Plan: s/p Procedure(s): DIAGNOSTIC LAPAROSCOPY/ LAPAROTOMY WITH SMALL BOWEL RESECTION TIMES TWO   Drain for fluid collection. multiple organisms, none predominant. Completed 14 day course of antibiotics, now discontinued Repeat CT(1/30)  shows near resolution of other abscess.no enteric connection. ? Tumor nodules vs small abscesses. leave drain in place for now. DVT on prophylactic dose lovenox, once HCT more stable, will change back to therapeutic dosing.CBC ot done yesterday.  Reordered stat today to decide about full dose Lovenox Continue TPN - calorie counts are pretty much zero. Prealbumin came up. Insulin SSI per pharmacy Disposition planning-SNF    appreciate internal medicine consult CT brain shows no acute intracranial process but there is severe parenchymal brain loss and small vessel ischemic disease  Appreciate oncology consult.scheduled for outpatient oncology consult with Dr. Benay Spice  on 02/04/2018     @PROBHOSP @  LOS: 27 days    Theresa Howe 01/20/2018  . .prob

## 2018-01-20 NOTE — Progress Notes (Signed)
Theresa Howe had an unwitnessed fall in here room. When entering the pt room, pt was laying beside low bed. Pt bed alarm was on the high setting at the time. Pt was assessed for any injuries and vitals signs was taking. After making sure that the pt had no injuries and didn't hit there head, pt was assessed back to bed. Pt's vitals 139/71,98, RR=21,98.2, and 100/RA. Pt's nephew Theresa Howe was called to be updated about the fall and also Dr. Brantley Stage was paged about the fall. Pt's family was thankful for the update and thankful that Theresa Howe was ok. No orders was giving and no CT scan was order. Pt is stable at this time and RN will continue to monitor pt for any changes in condition.

## 2018-01-20 NOTE — Progress Notes (Signed)
PHARMACY - ADULT TOTAL PARENTERAL NUTRITION CONSULT NOTE   Pharmacy Consult: TPN Indication: Intolerance to enteral feeding  Patient Measurements: Height: '5\' 5"'  (165.1 cm)(12/04/17) Weight: 119 lb 0.8 oz (54 kg) IBW/kg (Calculated) : 57 TPN AdjBW (KG): 57.6 Body mass index is 19.81 kg/m.  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  Patient was re-admitted from SNF after she began having nausea and vomiting and unable to tolerate enteral feeding.  Found to have a large dilated segment of bowel with bezoar and necrosis, now s/p SBR x 2.  Pharmacy managing TPN for nutritional support.  GI: Albumin low at 1.9. Prealbumin up to 27.3 (BL 5.3), having BMs, RLQ drain O/P is minimal. PPI. PRN Zofran/Phenergan with continued N/V. Possible choledocholithiasis on CT, repeat CT 1/30 - postprocedural hematoma vs rapid recurrence of tumor vs small leak collections. Endo: DM on metformin PTA. CBGs improving once SSI restarted (100-160s).  Insulin requirements in the past 24 hours: 9 units SSI + 25 units in TPN  Lytes: all wnl yesterday. K continues to trend up. Phos and Mg ok. CoCa 9.9 Renal: SCr down to 0.91, BUN down to 24 - no UOP charted. Net +9.2L since admit. Pulm: stable on RA Cards: BP variable, HR 100s - asa AC: Lovenox acute nonocclusive DVT - hgb low but stable, plts down to 915. Vaginal bleeding 1/27 and continuing, so Lovenox reduced to 33m SQ Q12H by MD, now to 571mSQ q24h with CrCl<30. Hepatobil: Alk phos elevated but trending down, AST/ALT/tbili WNL. TG up to 172 Neuro: confused - Lexapro, Remeron, s/p Haldol for sundowning (QTc 44732m morphine prn ID: S/p Zosyn for IAI. 1/21 CT showed abscess (cx with mult organisms, none predominant) - afebrile, WBC down to wnl  TPN Access: PICC placed 12/24/17>> pulled out 1/17; replaced 01/04/18>>pulled out again 1/30, replace with R IJ 1/31>> TPN start date: 12/25/17  Nutritional Goals (per  RD rec on 1/31): 1500-1700 kCal  75-90 gram protein per day > 1.5 L of fluid per day  Current Nutrition:  TPN NPO Boost Breeze TID (provides 250 kcal + 9 grams protein for each supplement - had been missing all doses but received one on 2/2)  Plan:  Continue TPN at 65 ml/hr  TPN will provide 90g AA, 234g CHO, and 37g of fat emulsion, which equals 1,532 kCals per day, meeting 100% of patient needs Electrolytes in TPN: continue low Ca and slightly higher Na + K. Max acetate  Daily multivitamin and trace elements in TPN Restart sensitive SSI Q6H + continue insulin in TPN at 25 units [facility] 46m36m per MD Monitor TPN labs F/U PO intake/diet advancement and ability to wean TPN   NathElenor QuinonesarmD, BCPS Clinical Pharmacist Pager [phone] 8:16 AM

## 2018-01-20 NOTE — Consult Note (Signed)
[facility] Medical Consultation  Theresa Howe:[id number] DOB: April 01, 1931 DOA: 12/24/2017 PCP: [name], MD   Requesting physician: Donell Beers, MD  Date of consultation:01/17/2018  Reason for consultation: generalized weakness, nausea, vomiting, failure to thrive  Impression/Recommendations Principal Problem:   Failure to thrive (0-17) Active Problems:   Diabetes with neurologic complications (HCC)   HTN (hypertension)   Glaucoma   Protein-calorie malnutrition, severe   Carcinomatosis (HCC)    Failure to thrive -  -multifactorial, possibly 2/2 intrabdominal infection, malignancy, debility of severe chronic illness -head CT no acute intracranial abnormalities -oncology consulted and is following  DM type II with hypoglycemia- -A1C 7.1 (11/28/17)  -hold SSI while on D5w -avoid hypoglycemia -continue D5w at 50cc/hr  AKI -baseline cr 0.78 -cr 1.35 on 01/18/2018.  However, the creatinine is down to 0.91 today. -Continue gentle hydration.   -avoid nephrotoxic meds/hypotension/dehydration -AK I has resolved.  Suspected dysphagia -speech therapist consulted -NPO until passes swallow eval  HTN -  -BP stable -IV Hydralazine prn    Hypernatremia and hypokalemia, resolved-  -Na+ 144 from 150 -K+ 3.5 from 3.0 -On 01/19/2018: Sodium was 143 today, and the potassium was 3.7 -No renal panel for today.  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 She underwent diagnostic laparoscopy and laparotomy with small bowel resection twice- on 12/04/2017 and 12/27/2017.  Patient was discharged to skilled nursing facility for rehabilitation after his first surgery, however during her stay there she  developed multiple episodes of emesis and consequently became severely dehydrated. She was readmitted on 12/27/2017 and in the hospital since then.   On 01/09/2018 she was diagnosed with the right lower quadrant abdominal abscess and underwent placement of the drain and was treated with broad-spectrum antibiotic-Zosyn.  Nevertheless patient remained, very weak unable to maintain oral intake due to persistent vomiting and difficulty swallowing.  She is now on IV hydration and TNA.  Subjective:  01/20/18: Patient seen.  The patient is not able to give a coherent history.     Past Medical History:  Diagnosis Date  . Abdominal mass 10/2017   SMALL BOWEL  . Diabetes mellitus    type 2  . Dyspnea   . Failure to thrive (0-17) 12/24/2017  . Glaucoma   . Hyperlipidemia   . Hypertension   . Neuropathy   . Osteoporosis   . Wheezing    Past Surgical History:  Procedure Laterality Date  . ABDOMINAL HYSTERECTOMY    . CATARACT EXTRACTION Right 01/2010  . COLON SURGERY    . COLONOSCOPY    . EUS N/A 04/17/2013   Procedure: UPPER ENDOSCOPIC ULTRASOUND (EUS) LINEAR;  Surgeon: Rachael Fee, MD;  Location: [facility] ENDOSCOPY;  Service: Endoscopy;  Laterality: N/A;  . GLAUCOMA SURGERY  2007  . IR FLUORO GUIDE CV LINE RIGHT  01/17/2018  . IR IMAGE GUIDED DRAINAGE PERCUT CATH  PERITONEAL RETROPERIT  01/09/2018  . IR SINUS/FIST TUBE CHK-NON GI  01/18/2018  . IR US GUIDE VASC ACCESS RIGHT  01/17/2018  . LAPAROSCOPIC SMALL BOWEL RESECTION  12/04/2017  . LAPAROSCOPIC SMALL BOWEL RESECTION N/A 12/04/2017   Procedure: LAPAROSCOPIC SMALL BOWEL RESECTION;  Surgeon: Almond Lint, MD;  Location: [facility] OR;  Service: General;  Laterality: N/A;  . LAPAROSCOPY N/A 12/04/2017   Procedure: LAPAROSCOPY DIAGNOSTIC;  Surgeon:  Almond Lint, MD;  Location: [facility] OR;  Service: General;  Laterality: N/A;  . LAPAROSCOPY N/A 12/27/2017   Procedure: DIAGNOSTIC LAPAROSCOPY/ LAPAROTOMY WITH SMALL BOWEL RESECTION TIMES TWO;  Surgeon: Almond Lint, MD;  Location: [facility] OR;  Service: General;  Laterality: N/A;  . MULTIPLE TOOTH EXTRACTIONS    . PICC LINE INSERTION  12/24/2017   Social History:  reports that  has never smoked. she has never used smokeless tobacco. She reports that she does not drink alcohol or use drugs.  No Known Allergies History reviewed. No pertinent family history.  Prior to Admission medications   Medication Sig Start Date End Date Taking? Authorizing Provider  acetaminophen (TYLENOL) 325 MG tablet Take 2 tablets (650 mg total) by mouth every 6 (six) hours as needed for mild pain (or Fever >/= 101). 12/13/17  Yes Sherrie George, PA-C  albuterol [facility] HFA) 108 (90 Base) MCG/ACT inhaler Inhale 2 puffs into the lungs every 6 (six) hours as needed for wheezing or shortness of breath. 12/23/15  Yes [place], Velna Hatchet, MD  aspirin 81 MG tablet Take 81 mg by mouth daily.     Yes [provider]  Calcium Carbonate-Vitamin D (CALCIUM 600 + D PO) Take 1 tablet by mouth daily.    Yes [provider]  collagenase (SANTYL) ointment Apply 1 application topically daily. Left buttock   Yes [provider]  docusate sodium (COLACE) 100 MG capsule Take 1 capsule (100 mg total) by mouth 2 (two) times daily. 12/13/17  Yes Sherrie George, PA-C  dorzolamide (TRUSOPT) 2 % ophthalmic solution Place 1 drop into both eyes 2 (two) times daily. 06/02/16  Yes [place], Velna Hatchet, MD  escitalopram (LEXAPRO) 10 MG tablet TAKE 1 TABLET AT BEDTIME Patient taking differently: TAKE 5mg  AT BEDTIME 12/03/17  Yes [place], Velna Hatchet, MD  feeding supplement, ENSURE ENLIVE, (ENSURE ENLIVE) LIQD Take 237 mLs by mouth 3 (three) times daily between meals. 12/13/17  Yes Sherrie George, PA-C  fluticasone [facility]) 50 MCG/ACT nasal spray USE 2 SPRAYS IN EACH       NOSTRIL DAILY 12/24/17  Yes [place], Velna Hatchet, MD  gabapentin (NEURONTIN) 100 MG capsule TAKE 1 TO 2 CAPSULES AT    BEDTIME FOR NEUROPATHY Patient taking differently:  Take 100 mg by mouth at bedtime.  12/03/17  Yes [place], Velna Hatchet, MD  latanoprost (XALATAN) 0.005 % ophthalmic solution Place 1 drop into both eyes at bedtime. 10/01/17  Yes [place], Velna Hatchet, MD  losartan-hydrochlorothiazide (HYZAAR) 50-12.5 MG tablet Take 1 tablet daily by mouth. 10/25/17  Yes [place], Velna Hatchet, MD  metFORMIN (GLUCOPHAGE) 1000 MG tablet Take 0.5 tablets (500 mg total) by mouth 2 (two) times daily with a meal. 12/26/16  Yes [place], Velna Hatchet, MD  mirtazapine (REMERON) 15 MG tablet Take 0.5 tablets (7.5 mg total) by mouth at bedtime. For appetite 10/04/17  Yes [place], Velna Hatchet, MD  Multiple Vitamins-Minerals (CENTRUM SILVER PO) Take 1 tablet by mouth daily.    Yes [provider]  omeprazole (PRILOSEC) 40 MG capsule Take 40 mg by mouth daily.   Yes [provider]  ondansetron (ZOFRAN) 4 MG tablet Take 4 mg by mouth every 6 (six) hours as needed for nausea or vomiting.   Yes [provider]  oxyCODONE (ROXICODONE) 5 MG/5ML solution Take 5 mLs (5 mg total) by mouth every 6 (six) hours. Patient taking differently: Take 5 mg by mouth every 6 (six) hours as needed for severe pain.  12/13/17  Yes Marlyne Beards,  Yehuda Mao, PA-C  polyethylene glycol (MIRALAX / GLYCOLAX) packet Take 17 g by mouth daily as needed for moderate constipation. 12/13/17  Yes Sherrie George, PA-C  alendronate (FOSAMAX) 70 MG tablet TAKE 1 TABLET EVERY 7 DAYS WITH A FULL GLASS OF WATER ON AN EMPTY STOMACH 12/25/17   [name], MD  ONE [facility] ULTRA TEST test strip TEST FASTING BLOOD SUGAR ONCE DAILY **DX E11.4 09/10/17   [name], MD   Physical Exam: Blood pressure (!) 152/85, pulse 93, temperature (!) 97.2 F (36.2 C), temperature source Oral, resp. rate 18, height 5\' 5"  (1.651 m), weight 54 kg (119 lb 0.8 oz), SpO2 100 %. Vitals:   01/20/18 0104 01/20/18 0516  BP: 139/71 (!) 152/85  Pulse: 98 93  Resp: (!) 21 18  Temp: 98.2 F (36.8 C) (!) 97.2 F (36.2 C)  SpO2: 100%  100%     General:  Very thin and frail looking.  Eyes: Pallor, no jaundice.   ENT: normal, no lesions, mucous membranes are intact  Neck: supple, no lymphadenopathy, no thyromegaly, no masses palpated  Cardiovascular: RRR, no R/M/G  Respiratory: diminished BS, CTA bilaterally, no use of accessory muscles observed  Abdomen: flar, NT, ND, poorly audible BS; drain in place with serosanguinous fluid. Surgical dressing in mid abdomen present.  Skin: warm, dray, no lesions or rashes on limited exam, turgor diminished  Musculoskeletal: No leg edema.    Neurologic: unable to assess as the patient does not follow commands  Labs on Admission:  Basic Metabolic Panel: Recent Labs  Lab 01/14/18 0425  01/15/18 0855 01/16/18 0444 01/17/18 0647 01/18/18 0429 01/19/18 0432  NA 148*   < > 149* 150* 150* 144 143  K 3.7   < > 3.9 3.6 3.0* 3.5 3.7  CL 110   < > 113* 112* 115* 110 110  CO2 25   < > 23 25 22 25 26   GLUCOSE 185*   < > 207* 98 127* 151* 246*  BUN 25*   < > 33* 34* 28* 25* 24*  CREATININE 0.81   < > 1.13* 1.37* 1.20* 1.35* 0.91  CALCIUM 8.9   < > 8.9 8.8* 8.5* 8.1* 8.3*  MG 1.9  --   --  2.3 1.9 1.9  --   PHOS 3.6  --   --  3.5 3.3 2.8  --    < > = values in this interval not displayed.   Liver Function Tests: Recent Labs  Lab 01/14/18 0425 01/16/18 0444 01/17/18 0647  AST 31 33 25  ALT 38 40 31  ALKPHOS 186* 162* 134*  BILITOT 0.5 0.6 0.7  PROT 6.1* 6.3* 6.0*  ALBUMIN 1.9* 2.0* 1.9*   CBC: Recent Labs  Lab 01/13/18 1756 01/14/18 0425 01/15/18 0415 01/17/18 0647 01/20/18 1250  WBC 8.0 9.1 13.7* 8.1 7.4  NEUTROABS  --  6.3  --   --   --   HGB 8.9* 8.9* 8.6* 8.4* 8.6*  HCT 28.9* 29.6* 28.8* 28.8* 28.2*  MCV 86.5 86.5 89.7 89.4 89.5  PLT 885* 915* 915* 746* 690*   CBG: Recent Labs  Lab 01/19/18 2340 01/20/18 0521 01/20/18 0741 01/20/18 1209 01/20/18 1648  GLUCAP 159* 161* 175* 208* 160*    Radiological Exams on Admission: No results  found.  EKG: normal tracing on EKG from12/12/18 Time spent: 50  Barnetta Chapel, MD [facility] Pager [phone]  If 7PM-7AM, please contact night-coverage [email or web address] Password [email or web address] 01/20/2018, 5:41 PM

## 2018-01-21 DIAGNOSIS — R262 Difficulty in walking, not elsewhere classified: Secondary | ICD-10-CM

## 2018-01-21 LAB — DIFFERENTIAL
BASOS PCT: 0 %
Basophils Absolute: 0 10*3/uL (ref 0.0–0.1)
EOS PCT: 8 %
Eosinophils Absolute: 0.5 10*3/uL (ref 0.0–0.7)
Lymphocytes Relative: 19 %
Lymphs Abs: 1.4 10*3/uL (ref 0.7–4.0)
MONO ABS: 0.6 10*3/uL (ref 0.1–1.0)
Monocytes Relative: 8 %
Neutro Abs: 4.5 10*3/uL (ref 1.7–7.7)
Neutrophils Relative %: 65 %

## 2018-01-21 LAB — COMPREHENSIVE METABOLIC PANEL
ALBUMIN: 1.9 g/dL — AB (ref 3.5–5.0)
ALK PHOS: 156 U/L — AB (ref 38–126)
ALT: 34 U/L (ref 14–54)
AST: 34 U/L (ref 15–41)
Anion gap: 8 (ref 5–15)
BILIRUBIN TOTAL: 0.4 mg/dL (ref 0.3–1.2)
BUN: 19 mg/dL (ref 6–20)
CALCIUM: 8.5 mg/dL — AB (ref 8.9–10.3)
CO2: 24 mmol/L (ref 22–32)
Chloride: 108 mmol/L (ref 101–111)
Creatinine, Ser: 0.62 mg/dL (ref 0.44–1.00)
GFR calc Af Amer: >60 mL/min (ref >60)
GFR calc non Af Amer: >60 mL/min (ref >60)
GLUCOSE: 184 mg/dL — AB (ref 65–99)
Potassium: 3.8 mmol/L (ref 3.5–5.1)
Sodium: 140 mmol/L (ref 135–145)
TOTAL PROTEIN: 5.7 g/dL — AB (ref 6.5–8.1)

## 2018-01-21 LAB — GLUCOSE, CAPILLARY
GLUCOSE-CAPILLARY: 199 mg/dL — AB (ref 65–99)
GLUCOSE-CAPILLARY: 236 mg/dL — AB (ref 65–99)
GLUCOSE-CAPILLARY: 208 mg/dL — AB (ref 65–99)

## 2018-01-21 LAB — CBC
HCT: 28.8 % — ABNORMAL LOW (ref 36.0–46.0)
Hemoglobin: 8.7 g/dL — ABNORMAL LOW (ref 12.0–15.0)
MCH: 27.3 pg (ref 26.0–34.0)
MCHC: 30.2 g/dL (ref 30.0–36.0)
MCV: 90.3 fL (ref 78.0–100.0)
PLATELETS: 704 10*3/uL — AB (ref 150–400)
RBC: 3.19 MIL/uL — ABNORMAL LOW (ref 3.87–5.11)
RDW: 20.7 % — AB (ref 11.5–15.5)
WBC: 7 10*3/uL (ref 4.0–10.5)

## 2018-01-21 LAB — MAGNESIUM: Magnesium: 1.7 mg/dL (ref 1.7–2.4)

## 2018-01-21 LAB — PHOSPHORUS: Phosphorus: 2.7 mg/dL (ref 2.5–4.6)

## 2018-01-21 LAB — TRIGLYCERIDES: TRIGLYCERIDES: 169 mg/dL — AB (ref <150)

## 2018-01-21 LAB — PREALBUMIN: Prealbumin: 30.9 mg/dL (ref 18–38)

## 2018-01-21 MED ORDER — TRAVASOL 10 % IV SOLN
INTRAVENOUS | Status: AC
  Administered 2018-01-21: 17:00:00 via INTRAVENOUS
  Filled 2018-01-21: qty 904.8

## 2018-01-21 MED ORDER — MAGNESIUM SULFATE 2 GM/50ML IV SOLN
2.0000 g | Freq: Once | INTRAVENOUS | Status: AC
  Administered 2018-01-21: 2 g via INTRAVENOUS
  Filled 2018-01-21: qty 50

## 2018-01-21 NOTE — Progress Notes (Addendum)
PT Cancellation Note  Patient Details Name: Theresa Howe MRN: [id number] DOB: [birth year]   Cancelled Treatment:    Reason Eval/Treat Not Completed: Patient declined/Pain limiting ability to participate. Patient declined mobility and reported her "bottom" hurts too bad to get OOB. Pt reported she is only comfortable when lying on her side in bed. PT will continue to follow acutely.    Salina April, PTA Pager: [phone]   01/21/2018, 11:53 AM

## 2018-01-21 NOTE — Progress Notes (Addendum)
Speech Language Pathology Treatment: Dysphagia  Patient Details Name: Theresa Howe MRN: [id number] DOB: [birth year] Today's Date: 01/21/2018 Time: 7829-5621 SLP Time Calculation (min) (ACUTE ONLY): 9 min  Assessment / Plan / Recommendation Clinical Impression  Patient seen for again diagnostic po trials. As during previous treatment session, patient able to consume three sips of thin liquid  with what appears to be normal oropharyngeal swallowing abilities. Post intake however, patient holding chest with onset of gagging and eventual emesis of juice consumed. Suspect a primary esophageal dysphagia or other GI component. MD, consider esophagram and/or GI consult. No further SLP needs indicated at this time. Defer further w/u to MD.     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 Patient was re-admitted from SNF after she began having nausea and vomiting and unable to tolerate enteral feeding. Found to have a large dilated segment of bowel with bezoar and necrosis, now s/p SBR x 2.      SLP Plan  Continue with current plan of care       Recommendations  Diet recommendations: Thin liquid Compensations: Slow rate;Small sips/bites                Oral Care Recommendations: Oral care BID Follow up Recommendations: Skilled Nursing facility SLP Visit Diagnosis: Dysphagia, oropharyngeal phase (R13.12) Plan: Continue with current plan of care       [facility] MA, CCC-SLP [phone]               [name] 01/21/2018, 1:55 PM

## 2018-01-21 NOTE — Progress Notes (Signed)
Occupational Therapy Treatment Patient Details Name: Theresa Howe MRN: [id number] DOB: 06/17/1931 Today's Date: 01/21/2018    History of present illness 84 yo with Stage IV neuroendocrine cancer of the small bowel, s/p resection 12/18 admitted with partial SBO s/p exp lap 1/10. PMHx: DM, glaucoma, HTN, neuropathy   OT comments  Pt up in chair on my arrival and willing to participate in seated grooming tasks but declined mobility for ADL transfers despite maximum encouragement. Pt able to determine that she is in the hospital with multiple choices given. She continues to demonstrate decreased sequencing abilities as well as decreased motor planning skills impacting her ability to use tools for grooming tasks. D/C recommendation remains appropriate. OT will continue to follow while admitted.    Follow Up Recommendations  SNF    Equipment Recommendations  None recommended by OT    Recommendations for Other Services      Precautions / Restrictions Precautions Precautions: Fall Precaution Comments: Abdominial incision, sacral wound, and recently has been leaking copious amounts of blood from her vagina?  Use her pullups that are in the room.  Required Braces or Orthoses: Other Brace/Splint Other Brace/Splint: JP drain Restrictions Weight Bearing Restrictions: No       Mobility Bed Mobility               General bed mobility comments: OOB in chair on my arrival.  Transfers                 General transfer comment: Despite maximum encouragement, pt declining mobility.     Balance Overall balance assessment: Needs assistance Sitting-balance support: Feet supported;No upper extremity supported Sitting balance-Leahy Scale: Good                                     ADL either performed or assessed with clinical judgement   ADL Overall ADL's : Needs assistance/impaired     Grooming: Applying deodorant;Wash/dry face;Sitting;Minimal  assistance Grooming Details (indicate cue type and reason): Tactile cueing and assistance to properly apply deoderant.                               General ADL Comments: Pt leaning sideways over armrest of chair on my arrival to room. Assisted to reposition for safety. Pt declined mobility reporting "I'm not getting up to walk today; I don't want to tax myself." Despite max encouragement continued to decline.      Vision       Perception     Praxis      Cognition Arousal/Alertness: Awake/alert Behavior During Therapy: Flat affect Overall Cognitive Status: Impaired/Different from baseline Area of Impairment: Orientation;Attention;Memory;Following commands;Safety/judgement;Awareness;Problem solving                 Orientation Level: Disoriented to;Time;Situation(but is aware that she fell over the weekend) Current Attention Level: Selective Memory: Decreased recall of precautions;Decreased short-term memory Following Commands: Follows one step commands inconsistently;Follows one step commands with increased time Safety/Judgement: Decreased awareness of deficits;Decreased awareness of safety Awareness: Intellectual Problem Solving: Slow processing;Difficulty sequencing;Requires verbal cues General Comments: Asked therapist "how did you know I was here" and unable to report who she believed therapist to be. Decreased ability to sequence and properly utilize deoderant.         Exercises     Shoulder Instructions       General  Comments      Pertinent Vitals/ Pain       Pain Assessment: Faces Faces Pain Scale: Hurts little more Pain Location: generalized Pain Descriptors / Indicators: Discomfort Pain Intervention(s): Limited activity within patient's tolerance;Monitored during session;Repositioned  Home Living                                          Prior Functioning/Environment              Frequency  Min 2X/week         Progress Toward Goals  OT Goals(current goals can now be found in the care plan section)  Progress towards OT goals: Progressing toward goals(Re-assessed; goals remain appropriate)  Acute Rehab OT Goals Patient Stated Goal: none OT Goal Formulation: Patient unable to participate in goal setting Time For Goal Achievement: 02/04/18 Potential to Achieve Goals: Fair  Plan Discharge plan remains appropriate    Co-evaluation                 AM-PAC PT "6 Clicks" Daily Activity     Outcome Measure   Help from another person eating meals?: A Lot Help from another person taking care of personal grooming?: A Little Help from another person toileting, which includes using toliet, bedpan, or urinal?: A Lot Help from another person bathing (including washing, rinsing, drying)?: A Lot Help from another person to put on and taking off regular upper body clothing?: A Little Help from another person to put on and taking off regular lower body clothing?: A Lot 6 Click Score: 14    End of Session Equipment Utilized During Treatment: Rolling walker;Gait belt  OT Visit Diagnosis: Unsteadiness on feet (R26.81);Cognitive communication deficit (R41.841)   Activity Tolerance Patient tolerated treatment well   Patient Left with call bell/phone within reach;in chair;with chair alarm set(positioned with pillows to improve safety )   Nurse Communication Mobility status        Time: 3664-4034 OT Time Calculation (min): 22 min  Charges: OT General Charges $OT Visit: 1 Visit OT Treatments $Self Care/Home Management : 8-22 mins  Doristine Section, MS OTR/L  Pager: [phone]    Damere Brandenburg A Lima Chillemi 01/21/2018, 12:37 PM

## 2018-01-21 NOTE — Progress Notes (Signed)
25 Days Post-Op  Subjective: No acute events.  Still having some incontinence.    Objective: Vital signs in last 24 hours: Temp:  [98.2 F (36.8 C)-98.7 F (37.1 C)] 98.2 F (36.8 C) (02/04 0454) Pulse Rate:  [99] 99 (02/04 0454) Resp:  [16-18] 18 (02/04 0454) BP: (130-161)/(73-77) 161/77 (02/04 0454) SpO2:  [100 %] 100 % (02/04 0454) Last BM Date: 01/20/18  Intake/Output from previous day: 02/03 0701 - 02/04 0700 In: 1502.6 [I.V.:1497.6] Out: 35 [Drains:35] Intake/Output this shift: No intake/output data recorded.  PE General appearance: remains confused .  able to cooperate Neuro:  intially appeared to have left facial droop, but was able to get her to smile and stick out tongue, CN intact on exam.  Good shrug and masseter strength as well.   Resp: breathing comfortably GI: soft, non-tender; RLQ drain with bloody drainage around insertion site. Drain output serous.       Lab Results:  Results for orders placed or performed during the hospital encounter of 12/24/17 (from the past 24 hour(s))  Glucose, capillary     Status: Abnormal   Collection Time: 01/20/18 12:09 PM  Result Value Ref Range   Glucose-Capillary 208 (H) 65 - 99 mg/dL  CBC     Status: Abnormal   Collection Time: 01/20/18 12:50 PM  Result Value Ref Range   WBC 7.4 4.0 - 10.5 K/uL   RBC 3.15 (L) 3.87 - 5.11 MIL/uL   Hemoglobin 8.6 (L) 12.0 - 15.0 g/dL   HCT 28.2 (L) 36.0 - 46.0 %   MCV 89.5 78.0 - 100.0 fL   MCH 27.3 26.0 - 34.0 pg   MCHC 30.5 30.0 - 36.0 g/dL   RDW 20.3 (H) 11.5 - 15.5 %   Platelets 690 (H) 150 - 400 K/uL  Glucose, capillary     Status: Abnormal   Collection Time: 01/20/18  4:48 PM  Result Value Ref Range   Glucose-Capillary 160 (H) 65 - 99 mg/dL  Glucose, capillary     Status: Abnormal   Collection Time: 01/20/18  7:35 PM  Result Value Ref Range   Glucose-Capillary 146 (H) 65 - 99 mg/dL   Comment 1 Notify RN    Comment 2 Document in Chart   Glucose, capillary     Status:  Abnormal   Collection Time: 01/20/18 11:42 PM  Result Value Ref Range   Glucose-Capillary 166 (H) 65 - 99 mg/dL  Comprehensive metabolic panel     Status: Abnormal   Collection Time: 01/21/18  4:09 AM  Result Value Ref Range   Sodium 140 135 - 145 mmol/L   Potassium 3.8 3.5 - 5.1 mmol/L   Chloride 108 101 - 111 mmol/L   CO2 24 22 - 32 mmol/L   Glucose, Bld 184 (H) 65 - 99 mg/dL   BUN 19 6 - 20 mg/dL   Creatinine, Ser 0.62 0.44 - 1.00 mg/dL   Calcium 8.5 (L) 8.9 - 10.3 mg/dL   Total Protein 5.7 (L) 6.5 - 8.1 g/dL   Albumin 1.9 (L) 3.5 - 5.0 g/dL   AST 34 15 - 41 U/L   ALT 34 14 - 54 U/L   Alkaline Phosphatase 156 (H) 38 - 126 U/L   Total Bilirubin 0.4 0.3 - 1.2 mg/dL   GFR calc non Af Amer >60 >60 mL/min   GFR calc Af Amer >60 >60 mL/min   Anion gap 8 5 - 15  Magnesium     Status: None   Collection Time: 01/21/18  4:09 AM  Result Value Ref Range   Magnesium 1.7 1.7 - 2.4 mg/dL  Phosphorus     Status: None   Collection Time: 01/21/18  4:09 AM  Result Value Ref Range   Phosphorus 2.7 2.5 - 4.6 mg/dL  CBC     Status: Abnormal   Collection Time: 01/21/18  4:09 AM  Result Value Ref Range   WBC 7.0 4.0 - 10.5 K/uL   RBC 3.19 (L) 3.87 - 5.11 MIL/uL   Hemoglobin 8.7 (L) 12.0 - 15.0 g/dL   HCT 28.8 (L) 36.0 - 46.0 %   MCV 90.3 78.0 - 100.0 fL   MCH 27.3 26.0 - 34.0 pg   MCHC 30.2 30.0 - 36.0 g/dL   RDW 20.7 (H) 11.5 - 15.5 %   Platelets 704 (H) 150 - 400 K/uL  Differential     Status: None   Collection Time: 01/21/18  4:09 AM  Result Value Ref Range   Neutrophils Relative % 65 %   Neutro Abs 4.5 1.7 - 7.7 K/uL   Lymphocytes Relative 19 %   Lymphs Abs 1.4 0.7 - 4.0 K/uL   Monocytes Relative 8 %   Monocytes Absolute 0.6 0.1 - 1.0 K/uL   Eosinophils Relative 8 %   Eosinophils Absolute 0.5 0.0 - 0.7 K/uL   Basophils Relative 0 %   Basophils Absolute 0.0 0.0 - 0.1 K/uL  Triglycerides     Status: Abnormal   Collection Time: 01/21/18  4:09 AM  Result Value Ref Range    Triglycerides 169 (H) <150 mg/dL  Prealbumin     Status: None   Collection Time: 01/21/18  4:09 AM  Result Value Ref Range   Prealbumin 30.9 18 - 38 mg/dL  Glucose, capillary     Status: Abnormal   Collection Time: 01/21/18  6:17 AM  Result Value Ref Range   Glucose-Capillary 199 (H) 65 - 99 mg/dL     Studies/Results: No results found.  Marland Kitchen aspirin  81 mg Oral Daily  . chlorhexidine  15 mL Mouth Rinse BID  . collagenase  1 application Topical Daily  . dorzolamide  1 drop Both Eyes BID  . enoxaparin (LOVENOX) injection  1 mg/kg Subcutaneous Q24H  . escitalopram  5 mg Oral QHS  . feeding supplement  1 Container Oral TID BM  . fluticasone  2 spray Each Nare Daily  . insulin aspart  0-9 Units Subcutaneous Q6H  . latanoprost  1 drop Both Eyes QHS  . mouth rinse  15 mL Mouth Rinse q12n4p  . mirtazapine  7.5 mg Oral QHS  . pantoprazole (PROTONIX) IV  40 mg Intravenous QHS  . sodium chloride flush  5 mL Intravenous Q8H     Assessment/Plan: s/p Procedure(s): DIAGNOSTIC LAPAROSCOPY/ LAPAROTOMY WITH SMALL BOWEL RESECTION TIMES TWO   Drain for fluid collection. multiple organisms, none predominant. Completed 14 day course of antibiotics, now discontinued Repeat CT(1/30)  shows near resolution of other abscess.no enteric connection. ? Tumor nodules vs small abscesses. leave drain in place for now because abscess cavity is still large even though no residual fluid in the cavity.   DVT on prophylactic dose lovenox, once HCT more stable, will change back to therapeutic dosing.CBC ot done yesterday.  Reordered stat today to decide about full dose Lovenox Continue TPN - calorie counts are pretty much zero. Prealbumin came up. Insulin SSI per pharmacy Disposition planning-SNF    appreciate internal medicine consult CT brain shows no acute intracranial process but there is severe  parenchymal brain loss and small vessel ischemic disease  Appreciate oncology consult.  Basically, pt has  become very withdrawn and uninvolved with care other than walking.  Has pulled out multiple tubes.  Needs goals of care.  Palliative care has seen and is working on discussions with family.         LOS: 28 days    Stark Klein 01/21/2018  . .prob

## 2018-01-21 NOTE — Consult Note (Signed)
[facility] Medical Consultation  TOBI BONNET IRJ:[id number] DOB: February 03, 1931 DOA: 12/24/2017 PCP: [name], MD   Requesting physician: Donell Beers, MD  Date of consultation:01/17/2018  Reason for consultation: generalized weakness, nausea, vomiting, failure to thrive  Impression/Recommendations Principal Problem:   Failure to thrive (0-17) Active Problems:   Diabetes with neurologic complications (HCC)   HTN (hypertension)   Glaucoma   Protein-calorie malnutrition, severe   Carcinomatosis (HCC)    Failure to thrive -  -multifactorial, possibly 2/2 intrabdominal infection, malignancy, debility of severe chronic illness -head CT no acute intracranial abnormalities -oncology consulted and is following  DM type II with hypoglycemia-Hypoglycemia has resolved -A1C 7.1 (11/28/17)  -on sensitive SSI -avoid hypoglycemia -Has been hyperglycemic since yesterday 01/20/18 -continue close monitoring on TPN and oral supplement  AKI, resolved -baseline cr 0.6 -cr 0.62 from 1.35 on 01/18/2018. -Continue gentle hydration at 40cc/hr.   -avoid nephrotoxic meds/hypotension/dehydration -AK I has resolved.  Suspected dysphagia -speech therapist consulted -01/21/18: per speech therapist suspect primary esophageal dysphagia or other GI component, to consider esophagram and or GI consult -Defer GI consult to primary team (surgery)  HTN -  -BP stable -IV Hydralazine prn    Hypernatremia and hypokalemia, resolved-  -Na+ 140 from 144 from 150 -K+ 3.8 from 3.5 from 3.0  Glaucoma - -continue home drops, Xalatan and Trusopt  Hypomagnesemia -mg2+ 1.7 -repleted with 2 gm mag sulf -continue to monitor electrolytes -on TPN and oral supplements  Ambulatory dysfunction -01/21/18: OT recommends SNF  Chief Complaint: nausea  Brief summary: 84 y/o m African-American female with history of 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 She underwent diagnostic laparoscopy and laparotomy with small bowel resection twice- on 12/04/2017 and 12/27/2017.  Patient was discharged to skilled nursing facility for rehabilitation after his first surgery, however during her stay there she developed multiple episodes of emesis and consequently became severely dehydrated. She was readmitted on 12/27/2017 and in the hospital since then.   On 01/09/2018 she was diagnosed with the right lower quadrant abdominal abscess and underwent placement of the drain and was treated with broad-spectrum antibiotic-Zosyn.  Nevertheless patient remained, very weak unable to maintain oral intake due to persistent vomiting and difficulty swallowing.  She is now on IV hydration and TNA.  Subjective:  01/21/18: Patient seen and examined with OT in the room with her. She is alert and oriented to self and place only. Sitting up in chair. No acute distress. Has no new complaints.     Past Medical History:  Diagnosis Date  . Abdominal mass 10/2017   SMALL BOWEL  . Diabetes mellitus    type 2  . Dyspnea   . Failure to thrive (0-17) 12/24/2017  . Glaucoma   . Hyperlipidemia   . Hypertension   . Neuropathy   . Osteoporosis   . Wheezing    Past Surgical History:  Procedure Laterality Date  . ABDOMINAL HYSTERECTOMY    . CATARACT EXTRACTION Right 01/2010  . COLON SURGERY    . COLONOSCOPY    . EUS N/A 04/17/2013   Procedure: UPPER ENDOSCOPIC ULTRASOUND (EUS) LINEAR;  Surgeon: Rachael Fee, MD;  Location: [facility];  Service: Endoscopy;  Laterality: N/A;  . GLAUCOMA SURGERY  2007  . IR FLUORO GUIDE CV LINE RIGHT  01/17/2018  . IR IMAGE GUIDED DRAINAGE PERCUT CATH  PERITONEAL RETROPERIT  01/09/2018  . IR SINUS/FIST TUBE CHK-NON GI  01/18/2018  .  IR US GUIDE VASC ACCESS RIGHT  01/17/2018  . LAPAROSCOPIC SMALL BOWEL RESECTION  12/04/2017  . LAPAROSCOPIC SMALL BOWEL RESECTION N/A 12/04/2017    Procedure: LAPAROSCOPIC SMALL BOWEL RESECTION;  Surgeon: Almond Lint, MD;  Location: [facility] OR;  Service: General;  Laterality: N/A;  . LAPAROSCOPY N/A 12/04/2017   Procedure: LAPAROSCOPY DIAGNOSTIC;  Surgeon: Almond Lint, MD;  Location: [facility] OR;  Service: General;  Laterality: N/A;  . LAPAROSCOPY N/A 12/27/2017   Procedure: DIAGNOSTIC LAPAROSCOPY/ LAPAROTOMY WITH SMALL BOWEL RESECTION TIMES TWO;  Surgeon: Almond Lint, MD;  Location: [facility] OR;  Service: General;  Laterality: N/A;  . MULTIPLE TOOTH EXTRACTIONS    . PICC LINE INSERTION  12/24/2017   Social History:  reports that  has never smoked. she has never used smokeless tobacco. She reports that she does not drink alcohol or use drugs.  No Known Allergies History reviewed. No pertinent family history.  Prior to Admission medications   Medication Sig Start Date End Date Taking? Authorizing Provider  acetaminophen (TYLENOL) 325 MG tablet Take 2 tablets (650 mg total) by mouth every 6 (six) hours as needed for mild pain (or Fever >/= 101). 12/13/17  Yes Sherrie George, PA-C  albuterol [facility] HFA) 108 (90 Base) MCG/ACT inhaler Inhale 2 puffs into the lungs every 6 (six) hours as needed for wheezing or shortness of breath. 12/23/15  Yes [place], Velna Hatchet, MD  aspirin 81 MG tablet Take 81 mg by mouth daily.     Yes [provider]  Calcium Carbonate-Vitamin D (CALCIUM 600 + D PO) Take 1 tablet by mouth daily.    Yes [provider]  collagenase (SANTYL) ointment Apply 1 application topically daily. Left buttock   Yes [provider]  docusate sodium (COLACE) 100 MG capsule Take 1 capsule (100 mg total) by mouth 2 (two) times daily. 12/13/17  Yes Sherrie George, PA-C  dorzolamide (TRUSOPT) 2 % ophthalmic solution Place 1 drop into both eyes 2 (two) times daily. 06/02/16  Yes [place], Velna Hatchet, MD  escitalopram (LEXAPRO) 10 MG tablet TAKE 1 TABLET AT BEDTIME Patient taking differently: TAKE 5mg  AT BEDTIME 12/03/17   Yes [place], Velna Hatchet, MD  feeding supplement, ENSURE ENLIVE, (ENSURE ENLIVE) LIQD Take 237 mLs by mouth 3 (three) times daily between meals. 12/13/17  Yes Sherrie George, PA-C  fluticasone [facility]) 50 MCG/ACT nasal spray USE 2 SPRAYS IN EACH       NOSTRIL DAILY 12/24/17  Yes [place], Velna Hatchet, MD  gabapentin (NEURONTIN) 100 MG capsule TAKE 1 TO 2 CAPSULES AT    BEDTIME FOR NEUROPATHY Patient taking differently: Take 100 mg by mouth at bedtime.  12/03/17  Yes [place], Velna Hatchet, MD  latanoprost (XALATAN) 0.005 % ophthalmic solution Place 1 drop into both eyes at bedtime. 10/01/17  Yes [place], Velna Hatchet, MD  losartan-hydrochlorothiazide (HYZAAR) 50-12.5 MG tablet Take 1 tablet daily by mouth. 10/25/17  Yes [place], Velna Hatchet, MD  metFORMIN (GLUCOPHAGE) 1000 MG tablet Take 0.5 tablets (500 mg total) by mouth 2 (two) times daily with a meal. 12/26/16  Yes [place], Velna Hatchet, MD  mirtazapine (REMERON) 15 MG tablet Take 0.5 tablets (7.5 mg total) by mouth at bedtime. For appetite 10/04/17  Yes [place], Velna Hatchet, MD  Multiple Vitamins-Minerals (CENTRUM SILVER PO) Take 1 tablet by mouth daily.    Yes [provider]  omeprazole (PRILOSEC) 40 MG capsule Take 40 mg by mouth daily.   Yes [provider]  ondansetron (ZOFRAN) 4 MG tablet Take 4 mg  by mouth every 6 (six) hours as needed for nausea or vomiting.   Yes [provider]  oxyCODONE (ROXICODONE) 5 MG/5ML solution Take 5 mLs (5 mg total) by mouth every 6 (six) hours. Patient taking differently: Take 5 mg by mouth every 6 (six) hours as needed for severe pain.  12/13/17  Yes Sherrie George, PA-C  polyethylene glycol [facility] / GLYCOLAX) packet Take 17 g by mouth daily as needed for moderate constipation. 12/13/17  Yes Sherrie George, PA-C  alendronate (FOSAMAX) 70 MG tablet TAKE 1 TABLET EVERY 7 DAYS WITH A FULL GLASS OF WATER ON AN EMPTY STOMACH 12/25/17   [name], MD  ONE [facility] ULTRA TEST test strip TEST FASTING  BLOOD SUGAR ONCE DAILY **DX E11.4 09/10/17   [name], MD   Physical Exam: 01/21/18. Patient seen and examined. Blood pressure (!) 161/77, pulse 99, temperature 98.2 F (36.8 C), temperature source Oral, resp. rate 18, height 5\' 5"  (1.651 m), weight 54 kg (119 lb 0.8 oz), SpO2 100 %. Vitals:   01/20/18 2017 01/21/18 0454  BP: 130/73 (!) 161/77  Pulse: 99 99  Resp: 16 18  Temp: 98.7 F (37.1 C) 98.2 F (36.8 C)  SpO2: 100% 100%     General:  Very thin and frail looking. Alert and oriented to self and place only.  Eyes: Pallor, no jaundice.   ENT: normal, no lesions, mucous membranes are intact  Neck: supple, no lymphadenopathy, no thyromegaly, no masses palpated  Cardiovascular: RRR, no R/M/G  Respiratory: diminished BS, CTA bilaterally, no use of accessory muscles observed  Abdomen: NT, ND, poorly audible BS; drain in place with serosanguinous fluid. Surgical dressing in mid abdomen present.  Skin: warm, dray, no lesions or rashes on limited exam, turgor diminished  Musculoskeletal: No leg edema.    Neurologic: non focal. Moves all 4 extremities.  Labs on Admission:  Basic Metabolic Panel: Recent Labs  Lab 01/16/18 0444 01/17/18 0647 01/18/18 0429 01/19/18 0432 01/21/18 0409  NA 150* 150* 144 143 140  K 3.6 3.0* 3.5 3.7 3.8  CL 112* 115* 110 110 108  CO2 25 22 25 26 24   GLUCOSE 98 127* 151* 246* 184*  BUN 34* 28* 25* 24* 19  CREATININE 1.37* 1.20* 1.35* 0.91 0.62  CALCIUM 8.8* 8.5* 8.1* 8.3* 8.5*  MG 2.3 1.9 1.9  --  1.7  PHOS 3.5 3.3 2.8  --  2.7   Liver Function Tests: Recent Labs  Lab 01/16/18 0444 01/17/18 0647 01/21/18 0409  AST 33 25 34  ALT 40 31 34  ALKPHOS 162* 134* 156*  BILITOT 0.6 0.7 0.4  PROT 6.3* 6.0* 5.7*  ALBUMIN 2.0* 1.9* 1.9*   CBC: Recent Labs  Lab 01/15/18 0415 01/17/18 0647 01/20/18 1250 01/21/18 0409  WBC 13.7* 8.1 7.4 7.0  NEUTROABS  --   --   --  4.5  HGB 8.6* 8.4* 8.6* 8.7*  HCT 28.8* 28.8* 28.2* 28.8*   MCV 89.7 89.4 89.5 90.3  PLT 915* 746* 690* 704*   CBG: Recent Labs  Lab 01/20/18 1209 01/20/18 1648 01/20/18 1935 01/20/18 2342 01/21/18 0617  GLUCAP 208* 160* 146* 166* 199*    Radiological Exams on Admission: No results found.  EKG: normal tracing on EKG from12/12/18 Time spent: 67  Darlin Drop, MD [facility] Pager [phone]  If 7PM-7AM, please contact night-coverage [email or web address] Password [email or web address] 01/21/2018, 9:22 AM

## 2018-01-21 NOTE — Progress Notes (Signed)
Chart reviewed and f/u with patient. She is awake, alert, pleasantly confused. Denies pain. No family at bedside.   Spoke with nephew, Joe via telephone. Introduced palliative medicine and updated on clinical condition. He works every day until 5:30pm and unable to be at hospital until after 6pm. (PMT not available after 5pm). He will try to get off work on Wednesday to meet with me. Joe has PMT contact number. Joe tells me he is documented HCPOA but gives me permission to call his sisters Mateo Flow or Seth Bake to discuss [place] in person.    Spoke with niece, Mateo Flow via telephone. Mateo Flow can meet me at bedside tomorrow, 01/22/18 at 2:30pm for initial [place] conversation.   NO CHARGE  Ihor Dow, FNP-C Palliative Medicine Team  Phone: [phone] Fax: [phone]

## 2018-01-21 NOTE — Progress Notes (Signed)
PHARMACY - ADULT TOTAL PARENTERAL NUTRITION CONSULT NOTE   Pharmacy Consult: TPN Indication: Intolerance to enteral feeding  Patient Measurements: Height: _0  (165.1 cm)(12/04/17) Weight: 119 lb 0.8 oz (54 kg) IBW/kg (Calculated) : 57 TPN AdjBW (KG): 57.6 Body mass index is 19.81 kg/m.  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  Patient was re-admitted from SNF after she began having nausea and vomiting and unable to tolerate enteral feeding.  Found to have a large dilated segment of bowel with bezoar and necrosis, now s/p SBR x 2.  Pharmacy managing TPN for nutritional support.  GI: Albumin low at 1.9. Prealbumin up to 30.9 (BL 5.3), having BMs, RLQ drain O/P is minimal. PPI. PRN Zofran/Phenergan with continued N/V. Possible choledocholithiasis on CT, repeat CT 1/30 - postprocedural hematoma vs rapid recurrence of tumor vs small leak collections. Endo: DM on metformin PTA. CBGs 146-208 on sensitive SSI + lantus 25 units daily in TPN.  Insulin requirements in the past 24 hours: 11 units SSI + 25 units in TPN  Lytes: all wnl yesterday. Na has trended down to 140, K continues to trend up. Phos wnl, Mg 1.7. CoCa 9.9 Renal: SCr down to 0.62, BUN down to 19 - no UOP charted. +1.7 L yesterday, Net +13.9L since admit. Pulm: stable on RA Cards: BP variable, HR 100s - asa AC: Lovenox acute nonocclusive DVT - hgb low but stable, plts 704k. -Of note, pt had vaginal bleeding 1/27 and continuing, so Lovenox reduced to 65m SQ Q12H by MD, now to 554mSQ q24h, CrCl has improved (> 30 mL/min) Hepatobil: Alk phos elevated and up, AST/ALT/tbili WNL. TG up to 169 Neuro: confused - Lexapro, Remeron, s/p Haldol for sundowning (QTc 44754m morphine prn ID: S/p Zosyn for IAI. 1/21 CT showed abscess (cx with mult organisms, none predominant) - afebrile, WBC down to wnl  TPN Access: PICC placed 12/24/17>> pulled out 1/17; replaced 01/04/18>>pulled out  again 1/30, replace with R IJ 1/31>> TPN start date: 12/25/17  Nutritional Goals (per RD rec on 1/31): 1500-1700 kCal  75-90 gram protein per day > 1.5 L of fluid per day  Current Nutrition:  TPN NPO Boost Breeze TID (provides 250 kcal + 9 grams protein for each supplement - had been missing all doses but received one on 2/2)  Plan:  Continue TPN at 65 ml/hr  TPN will provide 90g AA, 234g CHO, and 37g of fat emulsion, which equals 1,532 kCals per day, meeting 100% of patient needs Electrolytes in TPN: continue low Ca and slightly higher Na + K. Max acetate  Daily multivitamin and trace elements in TPN Continue sensitive SSI Q6H + continue insulin in TPN at 25 units Magnesium sulfate 2 gm IV once  [facility] 10[facility]86m per MD Monitor TPN labs F/U PO intake/diet advancement and ability to wean TPN  BenjAlbertina ParrarmD., BCPS Clinical Pharmacist Phone [phone]

## 2018-01-22 ENCOUNTER — Inpatient Hospital Stay (HOSPITAL_COMMUNITY): Payer: Medicare Other

## 2018-01-22 DIAGNOSIS — B370 Candidal stomatitis: Secondary | ICD-10-CM

## 2018-01-22 DIAGNOSIS — Z515 Encounter for palliative care: Secondary | ICD-10-CM

## 2018-01-22 DIAGNOSIS — R1310 Dysphagia, unspecified: Secondary | ICD-10-CM

## 2018-01-22 DIAGNOSIS — Z7189 Other specified counseling: Secondary | ICD-10-CM

## 2018-01-22 DIAGNOSIS — Z789 Other specified health status: Secondary | ICD-10-CM

## 2018-01-22 DIAGNOSIS — K651 Peritoneal abscess: Secondary | ICD-10-CM

## 2018-01-22 LAB — GLUCOSE, CAPILLARY
GLUCOSE-CAPILLARY: 161 mg/dL — AB (ref 65–99)
GLUCOSE-CAPILLARY: 159 mg/dL — AB (ref 65–99)
GLUCOSE-CAPILLARY: 181 mg/dL — AB (ref 65–99)
GLUCOSE-CAPILLARY: 123 mg/dL — AB (ref 65–99)
Glucose-Capillary: 129 mg/dL — ABNORMAL HIGH (ref 65–99)
Glucose-Capillary: 151 mg/dL — ABNORMAL HIGH (ref 65–99)
Glucose-Capillary: 189 mg/dL — ABNORMAL HIGH (ref 65–99)

## 2018-01-22 LAB — PHOSPHORUS: PHOSPHORUS: 3.2 mg/dL (ref 2.5–4.6)

## 2018-01-22 LAB — BASIC METABOLIC PANEL
Anion gap: 11 (ref 5–15)
BUN: 21 mg/dL — ABNORMAL HIGH (ref 6–20)
CALCIUM: 8.6 mg/dL — AB (ref 8.9–10.3)
CO2: 25 mmol/L (ref 22–32)
CREATININE: 0.65 mg/dL (ref 0.44–1.00)
Chloride: 106 mmol/L (ref 101–111)
Glucose, Bld: 155 mg/dL — ABNORMAL HIGH (ref 65–99)
Potassium: 4 mmol/L (ref 3.5–5.1)
SODIUM: 142 mmol/L (ref 135–145)

## 2018-01-22 LAB — MAGNESIUM: Magnesium: 2 mg/dL (ref 1.7–2.4)

## 2018-01-22 MED ORDER — IOPAMIDOL (ISOVUE-300) INJECTION 61%
150.0000 mL | Freq: Once | INTRAVENOUS | Status: AC | PRN
  Administered 2018-01-22: 30 mL via ORAL

## 2018-01-22 MED ORDER — INSULIN ASPART 100 UNIT/ML ~~LOC~~ SOLN
0.0000 [IU] | SUBCUTANEOUS | Status: DC
  Administered 2018-01-22: 3 [IU] via SUBCUTANEOUS
  Administered 2018-01-22: 2 [IU] via SUBCUTANEOUS
  Administered 2018-01-22: 3 [IU] via SUBCUTANEOUS
  Administered 2018-01-23 (×2): 2 [IU] via SUBCUTANEOUS

## 2018-01-22 MED ORDER — ACETAMINOPHEN 160 MG/5ML PO SOLN
500.0000 mg | Freq: Two times a day (BID) | ORAL | Status: DC
  Administered 2018-01-22 – 2018-01-25 (×7): 500 mg via ORAL
  Filled 2018-01-22 (×7): qty 20.3

## 2018-01-22 MED ORDER — MIRTAZAPINE 15 MG PO TABS
15.0000 mg | ORAL_TABLET | Freq: Every day | ORAL | Status: DC
  Administered 2018-01-22 – 2018-01-25 (×4): 15 mg via ORAL
  Filled 2018-01-22 (×4): qty 1

## 2018-01-22 MED ORDER — FLUCONAZOLE IN SODIUM CHLORIDE 200-0.9 MG/100ML-% IV SOLN
200.0000 mg | Freq: Once | INTRAVENOUS | Status: AC
  Administered 2018-01-22: 200 mg via INTRAVENOUS
  Filled 2018-01-22: qty 100

## 2018-01-22 MED ORDER — ENOXAPARIN SODIUM 60 MG/0.6ML ~~LOC~~ SOLN
50.0000 mg | Freq: Two times a day (BID) | SUBCUTANEOUS | Status: DC
  Administered 2018-01-23 – 2018-01-25 (×6): 50 mg via SUBCUTANEOUS
  Filled 2018-01-22 (×7): qty 0.6

## 2018-01-22 MED ORDER — TRAVASOL 10 % IV SOLN
INTRAVENOUS | Status: AC
  Administered 2018-01-22: 17:00:00 via INTRAVENOUS
  Filled 2018-01-22: qty 904.8

## 2018-01-22 MED ORDER — IOPAMIDOL (ISOVUE-300) INJECTION 61%
INTRAVENOUS | Status: AC
  Administered 2018-01-22: 30 mL via ORAL
  Filled 2018-01-22: qty 150

## 2018-01-22 MED ORDER — FLUCONAZOLE 100MG IVPB
100.0000 mg | INTRAVENOUS | Status: DC
  Administered 2018-01-23: 100 mg via INTRAVENOUS
  Filled 2018-01-22 (×2): qty 50

## 2018-01-22 MED ORDER — NYSTATIN 100000 UNIT/ML MT SUSP
5.0000 mL | Freq: Four times a day (QID) | OROMUCOSAL | Status: DC
  Administered 2018-01-22 – 2018-01-23 (×6): 500000 [IU] via ORAL
  Filled 2018-01-22 (×6): qty 5

## 2018-01-22 NOTE — Progress Notes (Signed)
26 Days Post-Op  Subjective: No acute events.    Objective: Vital signs in last 24 hours: Temp:  [98 F (36.7 C)-98.6 F (37 C)] 98 F (36.7 C) (02/05 1404) Pulse Rate:  [85-93] 85 (02/05 1404) Resp:  [16-17] 17 (02/05 1404) BP: (98-125)/(47-65) 125/65 (02/05 1404) SpO2:  [98 %-99 %] 99 % (02/05 1404) Last BM Date: 01/21/18  Intake/Output from previous day: 02/04 0701 - 02/05 0700 In: 1172.6 [P.O.:50; I.V.:1112.6] Out: 15 [Drains:15] Intake/Output this shift: Total I/O In: 0  Out: 150 [Urine:150]  PE General appearance: sleeping, awakened.   Neuro: symmetric face today. Resp: breathing comfortably GI: soft, non-tender; RLQ drain serous.       Lab Results:  Results for orders placed or performed during the hospital encounter of 12/24/17 (from the past 24 hour(s))  Glucose, capillary     Status: Abnormal   Collection Time: 01/21/18  6:06 PM  Result Value Ref Range   Glucose-Capillary 208 (H) 65 - 99 mg/dL   Comment 1 Notify RN   Glucose, capillary     Status: Abnormal   Collection Time: 01/22/18 12:11 AM  Result Value Ref Range   Glucose-Capillary 151 (H) 65 - 99 mg/dL   Comment 1 Notify RN    Comment 2 Document in Chart   Basic metabolic panel     Status: Abnormal   Collection Time: 01/22/18  5:32 AM  Result Value Ref Range   Sodium 142 135 - 145 mmol/L   Potassium 4.0 3.5 - 5.1 mmol/L   Chloride 106 101 - 111 mmol/L   CO2 25 22 - 32 mmol/L   Glucose, Bld 155 (H) 65 - 99 mg/dL   BUN 21 (H) 6 - 20 mg/dL   Creatinine, Ser 0.65 0.44 - 1.00 mg/dL   Calcium 8.6 (L) 8.9 - 10.3 mg/dL   GFR calc non Af Amer >60 >60 mL/min   GFR calc Af Amer >60 >60 mL/min   Anion gap 11 5 - 15  Magnesium     Status: None   Collection Time: 01/22/18  5:32 AM  Result Value Ref Range   Magnesium 2.0 1.7 - 2.4 mg/dL  Phosphorus     Status: None   Collection Time: 01/22/18  5:32 AM  Result Value Ref Range   Phosphorus 3.2 2.5 - 4.6 mg/dL  Glucose, capillary     Status: Abnormal    Collection Time: 01/22/18  6:26 AM  Result Value Ref Range   Glucose-Capillary 161 (H) 65 - 99 mg/dL   Comment 1 Notify RN    Comment 2 Document in Chart   Glucose, capillary     Status: Abnormal   Collection Time: 01/22/18 12:00 PM  Result Value Ref Range   Glucose-Capillary 159 (H) 65 - 99 mg/dL   Comment 1 Notify RN   Glucose, capillary     Status: Abnormal   Collection Time: 01/22/18  5:22 PM  Result Value Ref Range   Glucose-Capillary 181 (H) 65 - 99 mg/dL  Glucose, capillary     Status: Abnormal   Collection Time: 01/22/18  5:45 PM  Result Value Ref Range   Glucose-Capillary 189 (H) 65 - 99 mg/dL   Comment 1 Notify RN      Studies/Results: Dg Esophagus W/water Sol Cm  Result Date: 01/22/2018 CLINICAL DATA:  Patient was holding chest and gagging during bedside speech study. EXAM: ESOPHOGRAM/BARIUM SWALLOW TECHNIQUE: Single contrast examination was performed using isotonic water-soluble contrast-as requested. FLUOROSCOPY TIME:  Fluoroscopy Time:  27  seconds Radiation Exposure Index (if provided by the fluoroscopic device): 1.3 mGy Number of Acquired Spot Images: 0 COMPARISON:  None. FINDINGS: Patient was studied semi recumbent LPO. The esophagus has normal distensibility and motility. No stasis or spasm was noted. There is no detectable mucosal irregularity or mass. No hiatal hernia. Aspiration was never seen. No obstructive process seen in the pharynx. IMPRESSION: Normal single contrast esophagram. Electronically Signed   By: Monte Fantasia M.D.   On: 01/22/2018 13:49    . acetaminophen (TYLENOL) oral liquid 160 mg/5 mL  500 mg Oral BID  . aspirin  81 mg Oral Daily  . chlorhexidine  15 mL Mouth Rinse BID  . collagenase  1 application Topical Daily  . dorzolamide  1 drop Both Eyes BID  . enoxaparin (LOVENOX) injection  1 mg/kg Subcutaneous Q24H  . escitalopram  5 mg Oral QHS  . fluticasone  2 spray Each Nare Daily  . insulin aspart  0-15 Units Subcutaneous Q4H  .  latanoprost  1 drop Both Eyes QHS  . mouth rinse  15 mL Mouth Rinse q12n4p  . mirtazapine  15 mg Oral QHS  . nystatin  5 mL Oral QID  . pantoprazole (PROTONIX) IV  40 mg Intravenous QHS  . sodium chloride flush  5 mL Intravenous Q8H     Assessment/Plan: s/p Procedure(s): DIAGNOSTIC LAPAROSCOPY/ LAPAROTOMY WITH SMALL BOWEL RESECTION TIMES TWO   Drain for fluid collection. multiple organisms, none predominant. Completed 14 day course of antibiotics, now discontinued Repeat CT(1/30)  shows near resolution of other abscess.no enteric connection. ? Tumor nodules vs small abscesses. leave drain in place for now because abscess cavity is still large even though no residual fluid in the cavity.   DVT on prophylactic dose lovenox, once HCT more stable, will change back to therapeutic dosing.CBC ot done yesterday.  Reordered stat today to decide about full dose Lovenox Continue TPN - calorie counts are pretty much zero. Prealbumin came up. Insulin SSI per pharmacy Disposition planning-SNF discussions with family.    Palliative care input Thrush tx Repeat CT vs drain injection next week Increase lovenox.     LOS: 29 days    Stark Klein 01/22/2018  . .prob

## 2018-01-22 NOTE — Progress Notes (Signed)
PHARMACY - ADULT TOTAL PARENTERAL NUTRITION CONSULT NOTE   Pharmacy Consult: TPN Indication: Intolerance to enteral feeding  Patient Measurements: Height: '5\' 5"'  (165.1 cm)(12/04/17) Weight: 119 lb 0.8 oz (54 kg) IBW/kg (Calculated) : 57 TPN AdjBW (KG): 57.6 Body mass index is 19.81 kg/m.  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  Patient was re-admitted from SNF after she began having nausea and vomiting and unable to tolerate enteral feeding.  Found to have a large dilated segment of bowel with bezoar and necrosis, now s/p SBR x 2.  Pharmacy managing TPN for nutritional support.  GI: Albumin low at 1.9. Prealbumin up to 30.9 (BL 5.3), having BMs. LBM 2/3. RLQ drain O/P is minimal. PPI. PRN Zofran/Phenergan with continued N/V. Possible choledocholithiasis on CT, repeat CT 1/30 - postprocedural hematoma vs rapid recurrence of tumor vs small leak collections. Endo: DM on metformin PTA. CBGs 199-236 on sensitive SSI + regular insulin 25 units daily in TPN.  Insulin requirements in the past 24 hours: 12 units SSI + 25 units in TPN  Lytes: all wnl yesterday. Na 142, K 4 & continues to trend up. Phos and Mg wnl. CoCa 9.9 Renal: SCr down to 0.65, BUN 21 - no UOP charted. Patient states she is making urine  Pulm: stable on RA Cards: BP variable, HR 100s - asa AC: Lovenox acute nonocclusive DVT - hgb low but stable, plts 704k. -Of note, pt had vaginal bleeding 1/27 and continuing, so Lovenox reduced to 84m SQ Q12H by MD, now to 529mSQ q24h, CrCl has improved (> 30 mL/min) Hepatobil: Alk phos elevated and up, AST/ALT/tbili WNL. TG up to 169 Neuro: confused - Lexapro, Remeron, s/p Haldol for sundowning (QTc 44764m morphine prn ID: S/p Zosyn for IAI. 1/21 CT showed abscess (cx with mult organisms, none predominant) - afebrile, WBC wnl  TPN Access: PICC placed 12/24/17>> pulled out 1/17; replaced 01/04/18>>pulled out again 1/30, replace with  R IJ 1/31>> TPN start date: 12/25/17  Nutritional Goals (per RD rec on 1/31): 1500-1700 kCal  75-90 gram protein per day > 1.5 L of fluid per day  Current Nutrition:  TPN Full liquid Boost breeze d/c'ed due to poor compliance  Plan:  Continue TPN at 65 ml/hr  TPN will provide 90g AA, 234g CHO, and 37g of fat emulsion, which equals 1,532 kCals per day, meeting 100% of patient needs Electrolytes in TPN: continue low Ca and slightly higher Na + K. Max acetate  Daily multivitamin and trace elements in TPN Increase sliding scale to moderate SSI Q6H + continue insulin in TPN at 25 units [facility] at 51m3m per MD Monitor TPN labs F/U PO intake/diet advancement and ability to wean TPN  BenjAlbertina ParrarmD., BCPS Clinical Pharmacist Phone [phone]

## 2018-01-22 NOTE — Consult Note (Signed)
Consultation Note Date: 01/22/2018   Patient Name: Theresa Howe  DOB: October 03, 1931  MRN: [id number]  Age / Sex: 84 y.o., female  PCP: [name], MD Referring Physician: Almond Lint, MD  Reason for Consultation: Establishing goals of care  HPI/Patient Profile: 84 y.o. female  with past medical history of HTN, HLD, DM, neuropathy, osteoporosis, depression, glaucoma admitted on 12/24/2017 with nausea, vomiting, and abdominal pain. Recent hospitalization for small bowel obstruction s/p exploratory lap on 12/04/17. 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 Patient was discharged to SNF for rehab. On admission, repeat CT revealed partial obstruction requiring another exploratory lap on 12/27/17. 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 Throughout course of hospitalization, patient developed RLQ abscess s/p JP drain placement on 01/09/18. Received course of antibiotics. She has been on TNA and continues to have poor nutritional intake and dysphagia. Esophagram normal on 2/5. Palliative medicine consultation for goals of care.   Clinical Assessment and Goals of Care: I have reviewed medical records, discussed with care team, and met with patient and niece Theresa Howe) at bedside to discuss diagnosis, GOC, EOL wishes, disposition and options.  Introduced Palliative Medicine as specialized medical care for people living with serious illness. It focuses on providing relief from the symptoms and stress of a serious illness. The goal is to improve quality of life for both the patient and the family.  Patient awake, alert and able to participate in conversation. She is oriented to person and place. She does complain of sacral discomfort from pressure ulcer. During my visit, I applied barrier cream, sacral foam  pad, and repositioned her.   We discussed a brief life review of the patient. Prior to initial hospitalization in December, patient living home independently and able to perform ADL's without assistance. Still cooking for herself. No children. Support nieces and nephews. She has been losing weight since fall 2018.   Discussed hospital diagnoses and interventions. Answered many questions and concerns for [place] regarding plan of care. She asks about oncology f/u. I reviewed note with her and explained that Ms. Rayner has a schedule appointment with Dr. Truett Perna outpatient and this is a slow growing cancer that is most often just observed. I explained concern with nutritional status being of high priority.   During assessment, it is evident that Ms. Doney has severe oral thrush. Theresa Howe tells me she was treated for this when initially hospitalized but it did not resolve and appears worse to her. I reviewed results from esophogram with patient and niece. We discussed starting IV antifungal and oral swish and swallow to eliminate thrush and improve appetite.   Theresa Howe also speaks of her aunt having history of depression and this may be contributing. Reviewed medications. She is receiving home dose of Lexapro. We also discussed increasing Remeron to help stimulate appetite.   Advanced directives were discussed. Theresa Howe tells me her brother Theresa Howe) has HCPOA paperwork. I requested copy of living will/HCPOA documentation.   At  this point, Theresa Howe is hopeful for improvement in appetite if we treat the thrush. She speaks of patient's independence and joy from her home and dog, Theresa Howe. Previously, the patient and Theresa Howe had planned for discharge home to [facility] for two weeks and then back to her home. Theresa Howe asks about discharge. I explained that she is still receiving IV TNA and not at a point where we can yet consider discharge options.   [facility] plans to bring the patient's dog, Theresa Howe with vaccination  records to hospital this evening for "pet therapy."   Questions and concerns were addressed. PMT contact information given.    SUMMARY OF RECOMMENDATIONS    Continue FULL code/FULL scope treatment.   Treat the treatable. Added medications for thrush. If she does not respond to Fluconazole (which was previously given this admission per niece) may need to consider other antifungal options to clear thrush. Esophogram today was normal.  Symptom management--see below.  PMT will follow.   Code Status/Advance Care Planning:  Full code  Symptom Management:   Fluconazole 200mg  IV loading dose.   Fluconazole 100mg  IV q24h x14 doses  Nystatin PO swish and swallow QID  Increased Remeron 15mg  PO HS  Tylenol 500mg  PO BID  Palliative Prophylaxis:   Aspiration, Delirium Protocol, Frequent Pain Assessment, Oral Care and Turn Reposition  Additional Recommendations (Limitations, Scope, Preferences):  Full Scope Treatment  Psycho-social/Spiritual:   Desire for further Chaplaincy support:yes  Additional Recommendations: Caregiving  Support/Resources  Prognosis:   Unable to determine  Discharge Planning: To Be Determined      Primary Diagnoses: Present on Admission: . Failure to thrive (0-17) . Glaucoma . Diabetes with neurologic complications (HCC) . Protein-calorie malnutrition, severe   I have reviewed the medical record, interviewed the patient and family, and examined the patient. The following aspects are pertinent.  Past Medical History:  Diagnosis Date  . Abdominal mass 10/2017   SMALL BOWEL  . Diabetes mellitus    type 2  . Dyspnea   . Failure to thrive (0-17) 12/24/2017  . Glaucoma   . Hyperlipidemia   . Hypertension   . Neuropathy   . Osteoporosis   . Wheezing    Social History   Socioeconomic History  . Marital status: Widowed    Spouse name: None  . Number of children: None  . Years of education: None  . Highest education level: None    Social Needs  . Financial resource strain: None  . Food insecurity - worry: None  . Food insecurity - inability: None  . Transportation needs - medical: None  . Transportation needs - non-medical: None  Occupational History  . None  Tobacco Use  . Smoking status: Never Smoker  . Smokeless tobacco: Never Used  Substance and Sexual Activity  . Alcohol use: No  . Drug use: No  . Sexual activity: Not Currently  Other Topics Concern  . None  Social History Narrative  . None   History reviewed. No pertinent family history. Scheduled Meds: . acetaminophen (TYLENOL) oral liquid 160 mg/5 mL  500 mg Oral BID  . aspirin  81 mg Oral Daily  . chlorhexidine  15 mL Mouth Rinse BID  . collagenase  1 application Topical Daily  . dorzolamide  1 drop Both Eyes BID  . enoxaparin (LOVENOX) injection  1 mg/kg Subcutaneous Q24H  . escitalopram  5 mg Oral QHS  . fluticasone  2 spray Each Nare Daily  . insulin aspart  0-15 Units Subcutaneous Q4H  .  latanoprost  1 drop Both Eyes QHS  . mouth rinse  15 mL Mouth Rinse q12n4p  . mirtazapine  15 mg Oral QHS  . nystatin  5 mL Oral QID  . pantoprazole (PROTONIX) IV  40 mg Intravenous QHS  . sodium chloride flush  5 mL Intravenous Q8H   Continuous Infusions: . sodium chloride    . sodium chloride    . sodium chloride 40 mL/hr at 01/21/18 1034  . [START ON 01/23/2018] fluconazole (DIFLUCAN) IV    . fluconazole (DIFLUCAN) IV    . lactated ringers 10 mL/hr at 01/14/18 2117  . TPN ADULT (ION) 65 mL/hr at 01/21/18 1723  . TPN ADULT (ION)     PRN Meds:.acetaminophen **OR** acetaminophen, albuterol, diphenhydrAMINE **OR** diphenhydrAMINE, hydrALAZINE, iopamidol, morphine injection, ondansetron **OR** ondansetron (ZOFRAN) IV, prochlorperazine **OR** prochlorperazine, sodium chloride flush, sodium chloride flush, sodium chloride flush Medications Prior to Admission:  Prior to Admission medications   Medication Sig Start Date End Date Taking? Authorizing  Provider  acetaminophen (TYLENOL) 325 MG tablet Take 2 tablets (650 mg total) by mouth every 6 (six) hours as needed for mild pain (or Fever >/= 101). 12/13/17  Yes Sherrie George, PA-C  albuterol [facility] HFA) 108 (90 Base) MCG/ACT inhaler Inhale 2 puffs into the lungs every 6 (six) hours as needed for wheezing or shortness of breath. 12/23/15  Yes [place], Velna Hatchet, MD  aspirin 81 MG tablet Take 81 mg by mouth daily.     Yes [provider]  Calcium Carbonate-Vitamin D (CALCIUM 600 + D PO) Take 1 tablet by mouth daily.    Yes [provider]  collagenase (SANTYL) ointment Apply 1 application topically daily. Left buttock   Yes [provider]  docusate sodium (COLACE) 100 MG capsule Take 1 capsule (100 mg total) by mouth 2 (two) times daily. 12/13/17  Yes Sherrie George, PA-C  dorzolamide (TRUSOPT) 2 % ophthalmic solution Place 1 drop into both eyes 2 (two) times daily. 06/02/16  Yes [place], Velna Hatchet, MD  escitalopram (LEXAPRO) 10 MG tablet TAKE 1 TABLET AT BEDTIME Patient taking differently: TAKE 5mg  AT BEDTIME 12/03/17  Yes [place], Velna Hatchet, MD  feeding supplement, ENSURE ENLIVE, (ENSURE ENLIVE) LIQD Take 237 mLs by mouth 3 (three) times daily between meals. 12/13/17  Yes Sherrie George, PA-C  fluticasone [facility]) 50 MCG/ACT nasal spray USE 2 SPRAYS IN EACH       NOSTRIL DAILY 12/24/17  Yes [place], Velna Hatchet, MD  gabapentin (NEURONTIN) 100 MG capsule TAKE 1 TO 2 CAPSULES AT    BEDTIME FOR NEUROPATHY Patient taking differently: Take 100 mg by mouth at bedtime.  12/03/17  Yes [place], Velna Hatchet, MD  latanoprost (XALATAN) 0.005 % ophthalmic solution Place 1 drop into both eyes at bedtime. 10/01/17  Yes [place], Velna Hatchet, MD  losartan-hydrochlorothiazide (HYZAAR) 50-12.5 MG tablet Take 1 tablet daily by mouth. 10/25/17  Yes [place], Velna Hatchet, MD  metFORMIN (GLUCOPHAGE) 1000 MG tablet Take 0.5 tablets (500 mg total) by mouth 2 (two) times daily with a meal. 12/26/16  Yes  [place], Velna Hatchet, MD  mirtazapine (REMERON) 15 MG tablet Take 0.5 tablets (7.5 mg total) by mouth at bedtime. For appetite 10/04/17  Yes , Velna Hatchet, MD  Multiple Vitamins-Minerals (CENTRUM SILVER PO) Take 1 tablet by mouth daily.    Yes [provider]  omeprazole (PRILOSEC) 40 MG capsule Take 40 mg by mouth daily.   Yes [provider]  ondansetron (ZOFRAN) 4 MG tablet Take 4 mg  by mouth every 6 (six) hours as needed for nausea or vomiting.   Yes [provider]  oxyCODONE (ROXICODONE) 5 MG/5ML solution Take 5 mLs (5 mg total) by mouth every 6 (six) hours. Patient taking differently: Take 5 mg by mouth every 6 (six) hours as needed for severe pain.  12/13/17  Yes Sherrie George, PA-C  polyethylene glycol [facility] / GLYCOLAX) packet Take 17 g by mouth daily as needed for moderate constipation. 12/13/17  Yes Sherrie George, PA-C  alendronate (FOSAMAX) 70 MG tablet TAKE 1 TABLET EVERY 7 DAYS WITH A FULL GLASS OF WATER ON AN EMPTY STOMACH 12/25/17   [name], MD  ONE [facility] ULTRA TEST test strip TEST FASTING BLOOD SUGAR ONCE DAILY **DX E11.4 09/10/17   [name], MD   No Known Allergies Review of Systems  Constitutional: Positive for appetite change and fatigue.  Neurological: Positive for weakness.   Physical Exam  Constitutional: She is cooperative.  HENT:  Head: Normocephalic and atraumatic.  Pulmonary/Chest: Effort normal.  Abdominal:  JP drain/incision site C/D/I  Neurological: She is alert.  Oriented to person and place  Skin:  Pressure ulcer left buttock.   Psychiatric: She has a normal mood and affect. Her speech is normal and behavior is normal.  Nursing note and vitals reviewed.  Vital Signs: BP 125/65 (BP Location: Left Arm)   Pulse 85   Temp 98 F (36.7 C) (Oral)   Resp 17   Ht 5\' 5"  (1.651 m) Comment: 12/04/17  Wt 54 kg (119 lb 0.8 oz)   SpO2 99%   BMI 19.81 kg/m  Pain Assessment: 0-10 POSS *See Group  Information*: 1-Acceptable,Awake and alert Pain Score: 0-No pain  SpO2: SpO2: 99 % O2 Device:SpO2: 99 % O2 Flow Rate: .O2 Flow Rate (L/min): 2 L/min  IO: Intake/output summary:   Intake/Output Summary (Last 24 hours) at 01/22/2018 1609 Last data filed at 01/22/2018 1500 Gross per 24 hour  Intake 1122.58 ml  Output 165 ml  Net 957.58 ml    LBM: Last BM Date: 01/21/18 Baseline Weight: Weight: 57.6 kg (127 lb)(12/10/17) Most recent weight: Weight: 54 kg (119 lb 0.8 oz)     Palliative Assessment/Data: PPS 50%   Flowsheet Rows     Most Recent Value  Intake Tab  Referral Department  Surgery  Unit at Time of Referral  Med/Surg Unit  Palliative Care Primary Diagnosis  Other (Comment)  Palliative Care Type  New Palliative care  Reason for referral  Clarify Goals of Care  Date first seen by Palliative Care  01/20/18  Clinical Assessment  Palliative Performance Scale Score  50%  Psychosocial & Spiritual Assessment  Palliative Care Outcomes  Patient/Family meeting held?  Yes  Who was at the meeting?  patient and niece Theresa Howe)  Palliative Care Outcomes  Clarified goals of care, Improved pain interventions, Improved non-pain symptom therapy, Provided psychosocial or spiritual support, ACP counseling assistance      Time In: 1430 Time Out: 1550 Time Total: Greater than 50%  of this time was spent counseling and coordinating care related to the above assessment and plan.  Signed by:  Vennie Homans, FNP-C Palliative Medicine Team  Phone: [phone] Fax: [phone]   Please contact Palliative Medicine Team phone at [phone] for questions and concerns.  For individual provider: See Loretha Stapler

## 2018-01-22 NOTE — Consult Note (Signed)
[facility] Medical Consultation  MANIE SCOTTO ZOX:[id number] DOB: 22-Feb-1931 DOA: 12/24/2017 PCP: [name], MD   Requesting physician: Donell Beers, MD  Date of consultation:01/17/2018  Reason for consultation: generalized weakness, nausea, vomiting, failure to thrive  Impression/Recommendations Principal Problem:   Failure to thrive (0-17) Active Problems:   Diabetes with neurologic complications (HCC)   HTN (hypertension)   Glaucoma   Protein-calorie malnutrition, severe   Carcinomatosis (HCC)   On total parenteral nutrition (TPN)   Dysphagia   Intra-abdominal abscess (HCC)   Oral thrush   Palliative care by specialist   Goals of care, counseling/discussion    Failure to thrive -  -multifactorial, possibly 2/2 intrabdominal infection, malignancy, debility of severe chronic illness -head CT no acute intracranial abnormalities -oncology consulted and is following -Appreciate input from the palliative care team.  DM type II with hypoglycemia-Hypoglycemia has resolved -A1C 7.1 (11/28/17)  -on sensitive SSI -avoid hypoglycemia -Has been hyperglycemic since yesterday 01/20/18 -continue close monitoring on TPN and oral supplement  AKI, resolved -baseline cr 0.6 -cr 0.62 from 1.35 on 01/18/2018. -Continue gentle hydration at 40cc/hr.   -avoid nephrotoxic meds/hypotension/dehydration -AK I has resolved.  Suspected dysphagia -speech therapist consulted -01/21/18: per speech therapist suspect primary esophageal dysphagia or other GI component, to consider esophagram and or GI consult -Defer GI consult to primary team (surgery)  HTN -  -BP stable -IV Hydralazine prn    Hypernatremia and hypokalemia, resolved-  -Na+ 140 from 144 from 150 -K+ 3.8 from 3.5 from 3.0 -Sodium is 142 today, 01/22/2018.  And potassium is 4.  Serum creatinine is 0.65.  Glaucoma - -continue home drops, Xalatan and Trusopt  Hypomagnesemia -mg2+ 1.7 -repleted with 2 gm mag  sulf -continue to monitor electrolytes -on TPN and oral supplements -Magnesium is 2 today, 01/22/2018.  Ambulatory dysfunction -01/21/18: OT recommends SNF  Chief Complaint: No new complaints.  The patient is a poor historian.  However, patient looks much better today.  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 She underwent diagnostic laparoscopy and laparotomy with small bowel resection twice- on 12/04/2017 and 12/27/2017.  Patient was discharged to skilled nursing facility for rehabilitation after his first surgery, however during her stay there she developed multiple episodes of emesis and consequently became severely dehydrated. She was readmitted on 12/27/2017 and in the hospital since then.   On 01/09/2018 she was diagnosed with the right lower quadrant abdominal abscess and underwent placement of the drain and was treated with broad-spectrum antibiotic-Zosyn.  Nevertheless patient remained, very weak unable to maintain oral intake due to persistent vomiting and difficulty swallowing.  She is now on IV hydration and TNA.  Subjective:  01/22/18: No new complaints.  Poor historian.  Overall, looks better today.  Past Medical History:  Diagnosis Date  . Abdominal mass 10/2017   SMALL BOWEL  . Diabetes mellitus    type 2  . Dyspnea   . Failure to thrive (0-17) 12/24/2017  . Glaucoma   . Hyperlipidemia   . Hypertension   . Neuropathy   . Osteoporosis   . Wheezing    Past Surgical History:  Procedure Laterality Date  . ABDOMINAL HYSTERECTOMY    . CATARACT EXTRACTION Right 01/2010  . COLON SURGERY    . COLONOSCOPY    . EUS N/A 04/17/2013   Procedure: UPPER ENDOSCOPIC ULTRASOUND (EUS) LINEAR;  Surgeon: Rachael Fee,  MD;  Location: [facility];  Service: Endoscopy;  Laterality: N/A;  .  GLAUCOMA SURGERY  2007  . IR FLUORO GUIDE CV LINE RIGHT  01/17/2018  . IR IMAGE GUIDED DRAINAGE PERCUT CATH  PERITONEAL RETROPERIT  01/09/2018  . IR SINUS/FIST TUBE CHK-NON GI  01/18/2018  . IR US GUIDE VASC ACCESS RIGHT  01/17/2018  . LAPAROSCOPIC SMALL BOWEL RESECTION  12/04/2017  . LAPAROSCOPIC SMALL BOWEL RESECTION N/A 12/04/2017   Procedure: LAPAROSCOPIC SMALL BOWEL RESECTION;  Surgeon: Almond Lint, MD;  Location: [facility] OR;  Service: General;  Laterality: N/A;  . LAPAROSCOPY N/A 12/04/2017   Procedure: LAPAROSCOPY DIAGNOSTIC;  Surgeon: Almond Lint, MD;  Location: [facility] OR;  Service: General;  Laterality: N/A;  . LAPAROSCOPY N/A 12/27/2017   Procedure: DIAGNOSTIC LAPAROSCOPY/ LAPAROTOMY WITH SMALL BOWEL RESECTION TIMES TWO;  Surgeon: Almond Lint, MD;  Location: [facility] OR;  Service: General;  Laterality: N/A;  . MULTIPLE TOOTH EXTRACTIONS    . PICC LINE INSERTION  12/24/2017   Social History:  reports that  has never smoked. she has never used smokeless tobacco. She reports that she does not drink alcohol or use drugs.  No Known Allergies History reviewed. No pertinent family history.  Prior to Admission medications   Medication Sig Start Date End Date Taking? Authorizing Provider  acetaminophen (TYLENOL) 325 MG tablet Take 2 tablets (650 mg total) by mouth every 6 (six) hours as needed for mild pain (or Fever >/= 101). 12/13/17  Yes Sherrie George, PA-C  albuterol [facility] HFA) 108 (90 Base) MCG/ACT inhaler Inhale 2 puffs into the lungs every 6 (six) hours as needed for wheezing or shortness of breath. 12/23/15  Yes [place], Velna Hatchet, MD  aspirin 81 MG tablet Take 81 mg by mouth daily.     Yes [provider]  Calcium Carbonate-Vitamin D (CALCIUM 600 + D PO) Take 1 tablet by mouth daily.    Yes [provider]  collagenase (SANTYL) ointment Apply 1 application topically daily. Left buttock   Yes [provider]  docusate sodium (COLACE) 100 MG capsule Take 1 capsule  (100 mg total) by mouth 2 (two) times daily. 12/13/17  Yes Sherrie George, PA-C  dorzolamide (TRUSOPT) 2 % ophthalmic solution Place 1 drop into both eyes 2 (two) times daily. 06/02/16  Yes [place], Velna Hatchet, MD  escitalopram (LEXAPRO) 10 MG tablet TAKE 1 TABLET AT BEDTIME Patient taking differently: TAKE 5mg  AT BEDTIME 12/03/17  Yes [place], Velna Hatchet, MD  feeding supplement, ENSURE ENLIVE, (ENSURE ENLIVE) LIQD Take 237 mLs by mouth 3 (three) times daily between meals. 12/13/17  Yes Sherrie George, PA-C  fluticasone [facility]) 50 MCG/ACT nasal spray USE 2 SPRAYS IN EACH       NOSTRIL DAILY 12/24/17  Yes [place], Velna Hatchet, MD  gabapentin (NEURONTIN) 100 MG capsule TAKE 1 TO 2 CAPSULES AT    BEDTIME FOR NEUROPATHY Patient taking differently: Take 100 mg by mouth at bedtime.  12/03/17  Yes [place], Velna Hatchet, MD  latanoprost (XALATAN) 0.005 % ophthalmic solution Place 1 drop into both eyes at bedtime. 10/01/17  Yes [place], Velna Hatchet, MD  losartan-hydrochlorothiazide (HYZAAR) 50-12.5 MG tablet Take 1 tablet daily by mouth. 10/25/17  Yes [place], Velna Hatchet, MD  metFORMIN (GLUCOPHAGE) 1000 MG tablet Take 0.5 tablets (500 mg total) by mouth 2 (two) times daily with a meal. 12/26/16  Yes [place], Velna Hatchet, MD  mirtazapine (REMERON) 15 MG tablet Take 0.5 tablets (7.5 mg total) by mouth at bedtime. For appetite 10/04/17  Yes [place], Velna Hatchet, MD  Multiple Vitamins-Minerals (CENTRUM SILVER PO) Take 1 tablet by mouth daily.    Yes [provider]  omeprazole (PRILOSEC) 40 MG capsule Take 40 mg by mouth daily.   Yes [provider]  ondansetron (ZOFRAN) 4 MG tablet Take 4 mg by mouth every 6 (six) hours as needed for nausea or vomiting.   Yes [provider]  oxyCODONE (ROXICODONE) 5 MG/5ML solution Take 5 mLs (5 mg total) by mouth every 6 (six) hours. Patient taking differently: Take 5 mg by mouth every 6 (six) hours as needed for severe pain.  12/13/17  Yes Sherrie George, PA-C   polyethylene glycol [facility] / GLYCOLAX) packet Take 17 g by mouth daily as needed for moderate constipation. 12/13/17  Yes Sherrie George, PA-C  alendronate (FOSAMAX) 70 MG tablet TAKE 1 TABLET EVERY 7 DAYS WITH A FULL GLASS OF WATER ON AN EMPTY STOMACH 12/25/17   [name], MD  ONE [facility] ULTRA TEST test strip TEST FASTING BLOOD SUGAR ONCE DAILY **DX E11.4 09/10/17   [name], MD   Physical Exam: 01/21/18. Patient seen and examined. Blood pressure 125/65, pulse 85, temperature 98 F (36.7 C), temperature source Oral, resp. rate 17, height 5\' 5"  (1.651 m), weight 54 kg (119 lb 0.8 oz), SpO2 99 %. Vitals:   01/22/18 0535 01/22/18 1404  BP: 119/64 125/65  Pulse: 86 85  Resp: 16 17  Temp: 98.4 F (36.9 C) 98 F (36.7 C)  SpO2: 98% 99%     General:  Very thin and frail looking. Alert and oriented to self and place only.  Eyes: Pallor, no jaundice.   ENT: normal, no lesions, mucous membranes are intact  Neck: supple, no lymphadenopathy, no thyromegaly, no masses palpated  Cardiovascular: RRR, no R/M/G  Respiratory: diminished BS, CTA bilaterally, no use of accessory muscles observed  Abdomen: NT, ND, poorly audible BS; drain in place with serosanguinous fluid. Surgical dressing in mid abdomen present.  Skin: warm, dray, no lesions or rashes on limited exam, turgor diminished  Musculoskeletal: No leg edema.    Neurologic: non focal. Moves all 4 extremities.  Labs on Admission:  Basic Metabolic Panel: Recent Labs  Lab 01/16/18 0444 01/17/18 0647 01/18/18 0429 01/19/18 0432 01/21/18 0409 01/22/18 0532  NA 150* 150* 144 143 140 142  K 3.6 3.0* 3.5 3.7 3.8 4.0  CL 112* 115* 110 110 108 106  CO2 25 22 25 26 24 25   GLUCOSE 98 127* 151* 246* 184* 155*  BUN 34* 28* 25* 24* 19 21*  CREATININE 1.37* 1.20* 1.35* 0.91 0.62 0.65  CALCIUM 8.8* 8.5* 8.1* 8.3* 8.5* 8.6*  MG 2.3 1.9 1.9  --  1.7 2.0  PHOS 3.5 3.3 2.8  --  2.7 3.2   Liver Function Tests: Recent  Labs  Lab 01/16/18 0444 01/17/18 0647 01/21/18 0409  AST 33 25 34  ALT 40 31 34  ALKPHOS 162* 134* 156*  BILITOT 0.6 0.7 0.4  PROT 6.3* 6.0* 5.7*  ALBUMIN 2.0* 1.9* 1.9*   CBC: Recent Labs  Lab 01/17/18 0647 01/20/18 1250 01/21/18 0409  WBC 8.1 7.4 7.0  NEUTROABS  --   --  4.5  HGB 8.4* 8.6* 8.7*  HCT 28.8* 28.2* 28.8*  MCV 89.4 89.5 90.3  PLT 746* 690* 704*   CBG: Recent Labs  Lab 01/21/18 1806 01/22/18 0011 01/22/18 0626 01/22/18 1200 01/22/18 1722  GLUCAP 208* 151* 161* 159* 181*    Radiological Exams on Admission:  Dg Esophagus W/water Sol Cm  Result Date: 01/22/2018 CLINICAL DATA:  Patient was holding chest and gagging during bedside speech study. EXAM: ESOPHOGRAM/BARIUM SWALLOW TECHNIQUE: Single contrast examination was performed using isotonic water-soluble contrast-as requested. FLUOROSCOPY TIME:  Fluoroscopy Time:  54 seconds Radiation Exposure Index (if provided by the fluoroscopic device): 1.3 mGy Number of Acquired Spot Images: 0 COMPARISON:  None. FINDINGS: Patient was studied semi recumbent LPO. The esophagus has normal distensibility and motility. No stasis or spasm was noted. There is no detectable mucosal irregularity or mass. No hiatal hernia. Aspiration was never seen. No obstructive process seen in the pharynx. IMPRESSION: Normal single contrast esophagram. Electronically Signed   By: Marnee Spring M.D.   On: 01/22/2018 13:49    EKG: normal tracing on EKG from12/12/18 Time spent: 50  Barnetta Chapel, MD [facility] Pager [phone]  If 7PM-7AM, please contact night-coverage [email or web address] Password [facility] 01/22/2018, 5:37 PM

## 2018-01-22 NOTE — Progress Notes (Signed)
Patient vomited last night after taking her meds with one spoon ful of pudding.  She also vomited when drinking a small amount of Resource Breeze drink. Emesis was clear liquid mixed with pudding.

## 2018-01-22 NOTE — Social Work (Signed)
CSW aware of PMT consult and Ramirez-Perez meeting with pt niece Mateo Flow. CSW is continuing to follow to support discharge planning and pt/pt family needs.   Alexander Mt, [place] Work [phone]

## 2018-01-22 NOTE — Progress Notes (Signed)
PT Cancellation Note  Patient Details Name: JANAYSHA DEPAULO MRN: [id number] DOB: September 22, 1931   Cancelled Treatment:    Reason Eval/Treat Not Completed: Patient at procedure or test/unavailable Attempted to perform skilled PT treatment today, patient not available/at procedure; plan to attempt to check back later as/if schedule allows.    Deniece Ree PT, DPT, CBIS  Supplemental Physical Therapist [facility]   Pager [phone]

## 2018-01-22 NOTE — Progress Notes (Signed)
Nutrition Follow-up  DOCUMENTATION CODES:   Severe malnutrition in context of chronic illness  INTERVENTION:   -TPN management per pharmacy -D/c Boost Breeze, due to poor acceptance  NUTRITION DIAGNOSIS:   Severe Malnutrition related to chronic illness(stage IV neuroendocrine tumor) as evidenced by energy intake < 75% for > or equal to 1 month, severe fat depletion, severe muscle depletion.  Ongoing  GOAL:   Patient will meet greater than or equal to 90% of their needs  Met with TPN  MONITOR:   Diet advancement, Labs, Weight trends, I & O's, Skin, Other (Comment)(TPN)  REASON FOR ASSESSMENT:   Consult Calorie Count  ASSESSMENT:   Patient is a 84 year old female who is status post laparoscopic assisted small bowel resection in mid December.  She was discharged to a skilled nursing facility.  She was doing better, but now is throwing up almost everything that she eats.  She is able to keep down a small amount of Ensure.  She also continues to have some postop pain.  She denies fevers and chills.  She is having difficulty working with physical therapy due to weakness.  She is also having difficulty taking her medication because of nausea and vomiting.  1/7- PICC placed 1/10- s/pDiagnostic laparoscopy, exploratory laparotomy, small bowel resection x 2. 1/11- NGT d/c 1/12- NGT reinserted, d/c 1/23- s/p dran placement for RLQ fluid and gas collection (90 ml yielded) due to intra-abdominal abscess 2/1- per IR notes, drain injection demonstrates opacification of moderate size residual decompressed abscess cavity without fisutlous connection  Pt sleeping soundly at time of visit.   Case discussed with RN, who confirms plan for family meeting with palliative care service later on today. Pt will also undergo esophagram. Per RN, intake remains minimal; pt requested some sips of diet coke this morning, which she regurgitated. She is refusing Boost Breeze supplements.   Pt remains  TPN dependent. Per pharmacy note, plan to continue TPN at 65 ml/hr, which will provide 90g AA, 234g CHO, and 37g of fat emulsion, which equals 1,532 kCals per day, meeting 100% of patient needs  Labs reviewed: CBGS: 151-161 (inpatient orders for glycemic control are 0-15 units insulin aspart every 4 hours).   Diet Order:  Diet full liquid Room service appropriate? Yes; Fluid consistency: Thin TPN ADULT (ION) Fall precautions TPN ADULT (ION)  EDUCATION NEEDS:   Education needs have been addressed  Skin:  Skin Assessment: Skin Integrity Issues: Skin Integrity Issues:: Stage II, Incisions, Stage III Stage II: buttocks Stage III: coccyx Incisions: abdomen  Last BM:  01/20/18  Height:   Ht Readings from Last 1 Encounters:  12/24/17 5' 5" (1.651 m)    Weight:   Wt Readings from Last 1 Encounters:  01/14/18 119 lb 0.8 oz (54 kg)    Ideal Body Weight:  56.8 kg  BMI:  Body mass index is 19.81 kg/m.  Estimated Nutritional Needs:   Kcal:  1500-1700  Protein:  75-90 grams  Fluid:  > 1.5 L    Avyon Herendeen A. Jimmye Norman, RD, LDN, CDE Pager: [phone] After hours Pager: [phone]

## 2018-01-23 DIAGNOSIS — C800 Disseminated malignant neoplasm, unspecified: Secondary | ICD-10-CM

## 2018-01-23 DIAGNOSIS — R627 Adult failure to thrive: Secondary | ICD-10-CM

## 2018-01-23 LAB — BASIC METABOLIC PANEL
ANION GAP: 10 (ref 5–15)
BUN: 22 mg/dL — ABNORMAL HIGH (ref 6–20)
CALCIUM: 8.3 mg/dL — AB (ref 8.9–10.3)
CO2: 24 mmol/L (ref 22–32)
Chloride: 105 mmol/L (ref 101–111)
Creatinine, Ser: 0.67 mg/dL (ref 0.44–1.00)
GFR calc non Af Amer: >60 mL/min (ref >60)
GLUCOSE: 109 mg/dL — AB (ref 65–99)
Potassium: 4.1 mmol/L (ref 3.5–5.1)
Sodium: 139 mmol/L (ref 135–145)

## 2018-01-23 LAB — GLUCOSE, CAPILLARY
GLUCOSE-CAPILLARY: 102 mg/dL — AB (ref 65–99)
GLUCOSE-CAPILLARY: 149 mg/dL — AB (ref 65–99)
GLUCOSE-CAPILLARY: 161 mg/dL — AB (ref 65–99)
Glucose-Capillary: 190 mg/dL — ABNORMAL HIGH (ref 65–99)
Glucose-Capillary: 165 mg/dL — ABNORMAL HIGH (ref 65–99)

## 2018-01-23 LAB — MAGNESIUM: Magnesium: 2 mg/dL (ref 1.7–2.4)

## 2018-01-23 MED ORDER — INSULIN ASPART 100 UNIT/ML ~~LOC~~ SOLN: 0.0000 [IU] | Freq: Four times a day (QID) | SUBCUTANEOUS | Status: DC

## 2018-01-23 MED ORDER — INSULIN ASPART 100 UNIT/ML ~~LOC~~ SOLN
0.0000 [IU] | Freq: Four times a day (QID) | SUBCUTANEOUS | Status: DC
  Administered 2018-01-23 – 2018-01-24 (×4): 3 [IU] via SUBCUTANEOUS
  Administered 2018-01-24: 5 [IU] via SUBCUTANEOUS
  Administered 2018-01-25 (×2): 2 [IU] via SUBCUTANEOUS
  Administered 2018-01-25: 3 [IU] via SUBCUTANEOUS

## 2018-01-23 MED ORDER — ENSURE ENLIVE PO LIQD
237.0000 mL | Freq: Four times a day (QID) | ORAL | Status: DC
  Administered 2018-01-23 – 2018-01-24 (×3): 237 mL via ORAL

## 2018-01-23 MED ORDER — BOOST / RESOURCE BREEZE PO LIQD CUSTOM
1.0000 | Freq: Three times a day (TID) | ORAL | Status: DC
  Administered 2018-01-23 – 2018-01-24 (×3): 1 via ORAL

## 2018-01-23 MED ORDER — TRAVASOL 10 % IV SOLN
INTRAVENOUS | Status: AC
  Administered 2018-01-23: 18:00:00 via INTRAVENOUS
  Filled 2018-01-23: qty 904.8

## 2018-01-23 NOTE — Progress Notes (Signed)
Referring Physician(s): Mamie Laurel  Supervising Physician: Aletta Edouard  Patient Status:  [facility] - In-pt  Chief Complaint:  RLQ abscess drain placed in IR 1/23   Subjective:  Drain injection 2/1 did not show connection; but OP was still significant- Drain remains 15 cc yesterday recorded 10 cc in JP now--- bloody tinged   Allergies: Patient has no known allergies.  Medications: Prior to Admission medications   Medication Sig Start Date End Date Taking? Authorizing Provider  acetaminophen (TYLENOL) 325 MG tablet Take 2 tablets (650 mg total) by mouth every 6 (six) hours as needed for mild pain (or Fever >/= 101). 12/13/17  Yes Earnstine Regal, PA-C  albuterol [facility] HFA) 108 (90 Base) MCG/ACT inhaler Inhale 2 puffs into the lungs every 6 (six) hours as needed for wheezing or shortness of breath. 12/23/15  Yes [place], Modena Nunnery, MD  aspirin 81 MG tablet Take 81 mg by mouth daily.     Yes [provider]  Calcium Carbonate-Vitamin D (CALCIUM 600 + D PO) Take 1 tablet by mouth daily.    Yes [provider]  collagenase (SANTYL) ointment Apply 1 application topically daily. Left buttock   Yes [provider]  docusate sodium (COLACE) 100 MG capsule Take 1 capsule (100 mg total) by mouth 2 (two) times daily. 12/13/17  Yes Earnstine Regal, PA-C  dorzolamide (TRUSOPT) 2 % ophthalmic solution Place 1 drop into both eyes 2 (two) times daily. 06/02/16  Yes [place], Modena Nunnery, MD  escitalopram (LEXAPRO) 10 MG tablet TAKE 1 TABLET AT BEDTIME Patient taking differently: TAKE 5mg  AT BEDTIME 12/03/17  Yes [place], Modena Nunnery, MD  feeding supplement, ENSURE ENLIVE, (ENSURE ENLIVE) LIQD Take 237 mLs by mouth 3 (three) times daily between meals. 12/13/17  Yes Earnstine Regal, PA-C  fluticasone [facility]) 50 MCG/ACT nasal spray USE 2 SPRAYS IN EACH       NOSTRIL DAILY 12/24/17  Yes [place], Modena Nunnery, MD  gabapentin (NEURONTIN) 100 MG capsule TAKE 1 TO 2 CAPSULES AT     BEDTIME FOR NEUROPATHY Patient taking differently: Take 100 mg by mouth at bedtime.  12/03/17  Yes [place], Modena Nunnery, MD  latanoprost (XALATAN) 0.005 % ophthalmic solution Place 1 drop into both eyes at bedtime. 10/01/17  Yes [place], Modena Nunnery, MD  losartan-hydrochlorothiazide (HYZAAR) 50-12.5 MG tablet Take 1 tablet daily by mouth. 10/25/17  Yes [place], Modena Nunnery, MD  metFORMIN (GLUCOPHAGE) 1000 MG tablet Take 0.5 tablets (500 mg total) by mouth 2 (two) times daily with a meal. 12/26/16  Yes [place], Modena Nunnery, MD  mirtazapine (REMERON) 15 MG tablet Take 0.5 tablets (7.5 mg total) by mouth at bedtime. For appetite 10/04/17  Yes [place], Modena Nunnery, MD  Multiple Vitamins-Minerals (CENTRUM SILVER PO) Take 1 tablet by mouth daily.    Yes [provider]  omeprazole (PRILOSEC) 40 MG capsule Take 40 mg by mouth daily.   Yes [provider]  ondansetron (ZOFRAN) 4 MG tablet Take 4 mg by mouth every 6 (six) hours as needed for nausea or vomiting.   Yes [provider]  oxyCODONE (ROXICODONE) 5 MG/5ML solution Take 5 mLs (5 mg total) by mouth every 6 (six) hours. Patient taking differently: Take 5 mg by mouth every 6 (six) hours as needed for severe pain.  12/13/17  Yes Earnstine Regal, PA-C  polyethylene glycol [facility] / GLYCOLAX) packet Take 17 g by mouth daily as needed for moderate constipation. 12/13/17  Yes Earnstine Regal, PA-C  alendronate (FOSAMAX) 70 MG  tablet TAKE 1 TABLET EVERY 7 DAYS WITH A FULL GLASS OF WATER ON AN EMPTY STOMACH 12/25/17   Theresa Rossetti, MD  ONE TOUCH ULTRA TEST test strip TEST FASTING BLOOD SUGAR ONCE DAILY **DX E11.4 09/10/17   Theresa Rossetti, MD     Vital Signs: BP 118/62 (BP Location: Left Arm)   Pulse 88   Temp 98 F (36.7 C) (Axillary)   Resp 15   Ht 5\' 5"  (1.651 m) Comment: 12/04/17  Wt 119 lb 0.8 oz (54 kg)   SpO2 99%   BMI 19.81 kg/m   Physical Exam  Neurological:  Lethargic Groggy Hard to arouse  Skin: Skin is warm and  dry.  Skin site is clean and dry NT no bleeding OP 15 cc yesterday 10 cc in JP-- bloody tinged Mult orgs Cx     Imaging: Dg Esophagus W/water Sol Cm  Result Date: 01/22/2018 CLINICAL DATA:  Patient was holding chest and gagging during bedside speech study. EXAM: ESOPHOGRAM/BARIUM SWALLOW TECHNIQUE: Single contrast examination was performed using isotonic water-soluble contrast-as requested. FLUOROSCOPY TIME:  Fluoroscopy Time:  54 seconds Radiation Exposure Index (if provided by the fluoroscopic device): 1.3 mGy Number of Acquired Spot Images: 0 COMPARISON:  None. FINDINGS: Patient was studied semi recumbent LPO. The esophagus has normal distensibility and motility. No stasis or spasm was noted. There is no detectable mucosal irregularity or mass. No hiatal hernia. Aspiration was never seen. No obstructive process seen in the pharynx. IMPRESSION: Normal single contrast esophagram. Electronically Signed   By: Monte Fantasia M.D.   On: 01/22/2018 13:49    Labs:  CBC: Recent Labs    01/15/18 0415 01/17/18 0647 01/20/18 1250 01/21/18 0409  WBC 13.7* 8.1 7.4 7.0  HGB 8.6* 8.4* 8.6* 8.7*  HCT 28.8* 28.8* 28.2* 28.8*  PLT 915* 746* 690* 704*    COAGS: Recent Labs    11/28/17 1220 12/24/17 1301 01/08/18 1452 01/17/18 0718  INR 1.08 1.25 1.19 1.25    BMP: Recent Labs    01/19/18 0432 01/21/18 0409 01/22/18 0532 01/23/18 0422  NA 143 140 142 139  K 3.7 3.8 4.0 4.1  CL 110 108 106 105  CO2 26 24 25 24   GLUCOSE 246* 184* 155* 109*  BUN 24* 19 21* 22*  CALCIUM 8.3* 8.5* 8.6* 8.3*  CREATININE 0.91 0.62 0.65 0.67  GFRNONAA 56* >60 >60 >60  GFRAA >60 >60 >60 >60    LIVER FUNCTION TESTS: Recent Labs    01/14/18 0425 01/16/18 0444 01/17/18 0647 01/21/18 0409  BILITOT 0.5 0.6 0.7 0.4  AST 31 33 25 34  ALT 38 40 31 34  ALKPHOS 186* 162* 134* 156*  PROT 6.1* 6.3* 6.0* 5.7*  ALBUMIN 1.9* 2.0* 1.9* 1.9*    Assessment and Plan:  Drain intact OP slowing Re CT  soon---drain inj? Plan per Dr Barry Dienes  Electronically Signed: Monia Sabal A, PA-C 01/23/2018, 8:50 AM   I spent a total of 15 Minutes at the the patient's bedside AND on the patient's hospital floor or unit, greater than 50% of which was counseling/coordinating care for abscess drain

## 2018-01-23 NOTE — Progress Notes (Signed)
Daily Progress Note   Patient Name: Theresa Howe       Date: 01/23/2018 DOB: [birth year]  Age: 84 y.o. MRN#: [id number]  Attending Physician: Almond Lint, MD Primary Care Physician: [name], MD Admit Date: 12/24/2017  Reason for Consultation/Follow-up: Establishing goals of care  Subjective: Patient awake, alert, oriented to person and place. Denies pain or discomfort.   GOC:  Niece (Joe HCPOA's wife) at bedside. Answered questions and concerns regarding plan of care. She has attempted to give Ms. Gongwer boost this afternoon but she took few sips. She asked if this is related to "failure to thrive." I did explain the importance of nutritional status and her not being able to leave the hospital with TNA in place. That yes, this could be failure to thrive secondary to age, recent weight loss, recent decline in health with neuroendocrine cancer and s/p two SBO resections, malnutrition, and infection. I explained my hope that her appetite will improve if thrush clears but also preparing for the "worst" and big decisions they are faced with as a family in regards to next steps/how aggressive to be in her care--feeding tube?   Answered questions and concerns. Emotional/spiritual support provided.   Length of Stay: 30  Current Medications: Scheduled Meds:  . acetaminophen (TYLENOL) oral liquid 160 mg/5 mL  500 mg Oral BID  . aspirin  81 mg Oral Daily  . chlorhexidine  15 mL Mouth Rinse BID  . collagenase  1 application Topical Daily  . dorzolamide  1 drop Both Eyes BID  . enoxaparin (LOVENOX) injection  50 mg Subcutaneous Q12H  . escitalopram  5 mg Oral QHS  . feeding supplement  1 Container Oral TID BM  . feeding supplement (ENSURE ENLIVE)  237 mL Oral QID  . fluticasone  2 spray  Each Nare Daily  . insulin aspart  0-15 Units Subcutaneous Q6H  . latanoprost  1 drop Both Eyes QHS  . mouth rinse  15 mL Mouth Rinse q12n4p  . mirtazapine  15 mg Oral QHS  . nystatin  5 mL Oral QID  . pantoprazole (PROTONIX) IV  40 mg Intravenous QHS  . sodium chloride flush  5 mL Intravenous Q8H    Continuous Infusions: . sodium chloride    . sodium chloride    . sodium chloride 40  mL/hr at 01/23/18 1234  . fluconazole (DIFLUCAN) IV Stopped (01/23/18 1334)  . lactated ringers 10 mL/hr at 01/14/18 2117  . TPN ADULT (ION) 65 mL/hr at 01/22/18 1705  . TPN ADULT (ION)      PRN Meds: acetaminophen **OR** acetaminophen, albuterol, diphenhydrAMINE **OR** diphenhydrAMINE, hydrALAZINE, iopamidol, morphine injection, ondansetron **OR** ondansetron (ZOFRAN) IV, prochlorperazine **OR** prochlorperazine, sodium chloride flush, sodium chloride flush, sodium chloride flush  Physical Exam  Constitutional: She is cooperative.  Cardiovascular: Regular rhythm.  Pulmonary/Chest: Effort normal.  Neurological: She is alert.  Oriented to person and place. Pleasantly confused  Skin: Skin is warm and dry.  Nursing note and vitals reviewed.          Vital Signs: BP 118/62 (BP Location: Left Arm)   Pulse 88   Temp 98 F (36.7 C) (Axillary)   Resp 15   Ht 5\' 5"  (1.651 m) Comment: 12/04/17  Wt 54 kg (119 lb 0.8 oz)   SpO2 99%   BMI 19.81 kg/m  SpO2: SpO2: 99 % O2 Device: O2 Device: Not Delivered O2 Flow Rate: O2 Flow Rate (L/min): 2 L/min  Intake/output summary:   Intake/Output Summary (Last 24 hours) at 01/23/2018 1402 Last data filed at 01/23/2018 1213 Gross per 24 hour  Intake -  Output 120 ml  Net -120 ml   LBM: Last BM Date: 01/23/18 Baseline Weight: Weight: 57.6 kg (127 lb)(12/10/17) Most recent weight: Weight: 54 kg (119 lb 0.8 oz)       Palliative Assessment/Data: PPS 50%   Flowsheet Rows     Most Recent Value  Intake Tab  Referral Department  Surgery  Unit at Time of  Referral  Med/Surg Unit  Palliative Care Primary Diagnosis  Other (Comment)  Palliative Care Type  New Palliative care  Reason for referral  Clarify Goals of Care  Date first seen by Palliative Care  01/20/18  Clinical Assessment  Palliative Performance Scale Score  50%  Psychosocial & Spiritual Assessment  Palliative Care Outcomes  Patient/Family meeting held?  Yes  Who was at the meeting?  patient and niece Vikki Ports)  Palliative Care Outcomes  Clarified goals of care, Improved pain interventions, Improved non-pain symptom therapy, Provided psychosocial or spiritual support, ACP counseling assistance      Patient Active Problem List   Diagnosis Date Noted  . On total parenteral nutrition (TPN)   . Dysphagia   . Intra-abdominal abscess (HCC)   . Oral thrush   . Palliative care by specialist   . Goals of care, counseling/discussion   . Carcinomatosis (HCC) 01/17/2018  . Failure to thrive (0-17) 12/24/2017  . Protein-calorie malnutrition, severe 12/06/2017  . Small bowel mass 12/04/2017  . Protein-calorie malnutrition (HCC) 09/11/2017  . Weight loss 09/11/2017  . Diabetic neuropathy (HCC) 09/28/2015  . Hip pain 09/21/2014  . Muscle ache of extremity 06/12/2014  . Anxiety state 10/06/2013  . Bladder prolapse, female, acquired 08/03/2013  . Osteoporosis, unspecified 08/03/2013  . Nonspecific (abnormal) findings on radiological and other examination of biliary tract 04/17/2013  . Diabetes with neurologic complications (HCC) 09/18/2011  . HTN (hypertension) 09/18/2011  . Hyperlipidemia LDL goal <70 09/18/2011  . Glaucoma 09/18/2011    Palliative Care Assessment & Plan   Patient Profile: 84 y.o. female  with past medical history of HTN, HLD, DM, neuropathy, osteoporosis, depression, glaucoma admitted on 12/24/2017 with nausea, vomiting, and abdominal pain. Recent hospitalization for small bowel obstruction s/p exploratory lap on 12/04/17. Pathology revealed low grade  neuroendocrine tumor with omental adipose  tissue and lymph node involvement. Patient was discharged to SNF for rehab. On admission, repeat CT revealed partial obstruction requiring another exploratory lap on 12/27/17. 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 Throughout course of hospitalization, patient developed RLQ abscess s/p JP drain placement on 01/09/18. Received course of antibiotics. She has been on TNA and continues to have poor nutritional intake and dysphagia. Esophagram normal on 2/5. Palliative medicine consultation for goals of care.   Assessment: Neuroendocrine carcinomatosis SBO s/p resection x2 RLQ abdominal abscess Oral thrush Dysphagia Adult FTT AKI  Recommendations/Plan:  Continue FULL code/FULL scope  Continue treatment for thrush. Hopeful for improvement in appetite if thrush clears. May need to consider other antifungal agents if thrush does not respond to Fluconazole.  PMT will continue to follow.  Goals of Care and Additional Recommendations:  Limitations on Scope of Treatment: Full Scope Treatment  Code Status: FULL   Code Status Orders  (From admission, onward)        Start     Ordered   12/24/17 1120  Full code  Continuous     12/24/17 1119    Code Status History    Date Active Date Inactive Code Status Order ID Comments User Context   12/04/2017 13:44 12/13/2017 21:57 Full Code [id number]  Almond Lint, MD Inpatient    Advance Directive Documentation     Most Recent Value  Type of Advance Directive  Healthcare Power of Attorney  Pre-existing out of facility DNR order (yellow form or pink MOST form)  No data  "MOST" Form in Place?  No data       Prognosis:   Unable to determine  Discharge Planning:  To Be Determined  Care plan was discussed with patient and neice  Thank you for allowing the Palliative Medicine Team to assist in the care of this patient.   Time In: 1335 Time  Out: 1400 Total Time 25 Prolonged Time Billed  no      Greater than 50%  of this time was spent counseling and coordinating care related to the above assessment and plan.  Vennie Homans, FNP-C Palliative Medicine Team  Phone: [phone] Fax: [phone]  Please contact Palliative Medicine Team phone at [phone] for questions and concerns.

## 2018-01-23 NOTE — Progress Notes (Signed)
27 Days Post-Op  Subjective: Started on diflucan for thrush.  Still having gagging today. Has been walking with mobility specialist on floor.    Objective: Vital signs in last 24 hours: Temp:  [98 F (36.7 C)-98.4 F (36.9 C)] 98 F (36.7 C) (02/06 0451) Pulse Rate:  [85-88] 88 (02/06 0451) Resp:  [15-17] 15 (02/06 0451) BP: (115-125)/(60-65) 118/62 (02/06 0451) SpO2:  [98 %-99 %] 99 % (02/06 0451) Last BM Date: 01/23/18  Intake/Output from previous day: 02/05 0701 - 02/06 0700 In: 0  Out: 150 [Urine:150] Intake/Output this shift: No intake/output data recorded.  PE General appearance: much more alert today, less confused..   Neuro: weak, but intact.   Resp: breathing comfortably GI: soft, non-tender; RLQ drain serous.       Lab Results:  Results for orders placed or performed during the hospital encounter of 12/24/17 (from the past 24 hour(s))  Glucose, capillary     Status: Abnormal   Collection Time: 01/22/18 12:00 PM  Result Value Ref Range   Glucose-Capillary 159 (H) 65 - 99 mg/dL   Comment 1 Notify RN   Glucose, capillary     Status: Abnormal   Collection Time: 01/22/18  5:22 PM  Result Value Ref Range   Glucose-Capillary 181 (H) 65 - 99 mg/dL  Glucose, capillary     Status: Abnormal   Collection Time: 01/22/18  5:45 PM  Result Value Ref Range   Glucose-Capillary 189 (H) 65 - 99 mg/dL   Comment 1 Notify RN   Glucose, capillary     Status: Abnormal   Collection Time: 01/22/18  8:37 PM  Result Value Ref Range   Glucose-Capillary 123 (H) 65 - 99 mg/dL  Glucose, capillary     Status: Abnormal   Collection Time: 01/22/18 11:46 PM  Result Value Ref Range   Glucose-Capillary 129 (H) 65 - 99 mg/dL  Glucose, capillary     Status: Abnormal   Collection Time: 01/23/18  3:54 AM  Result Value Ref Range   Glucose-Capillary 102 (H) 65 - 99 mg/dL  Basic metabolic panel     Status: Abnormal   Collection Time: 01/23/18  4:22 AM  Result Value Ref Range   Sodium 139  135 - 145 mmol/L   Potassium 4.1 3.5 - 5.1 mmol/L   Chloride 105 101 - 111 mmol/L   CO2 24 22 - 32 mmol/L   Glucose, Bld 109 (H) 65 - 99 mg/dL   BUN 22 (H) 6 - 20 mg/dL   Creatinine, Ser 0.67 0.44 - 1.00 mg/dL   Calcium 8.3 (L) 8.9 - 10.3 mg/dL   GFR calc non Af Amer >60 >60 mL/min   GFR calc Af Amer >60 >60 mL/min   Anion gap 10 5 - 15  Magnesium     Status: None   Collection Time: 01/23/18  4:22 AM  Result Value Ref Range   Magnesium 2.0 1.7 - 2.4 mg/dL  Glucose, capillary     Status: Abnormal   Collection Time: 01/23/18  7:59 AM  Result Value Ref Range   Glucose-Capillary 149 (H) 65 - 99 mg/dL     Studies/Results: Dg Esophagus W/water Sol Cm  Result Date: 01/22/2018 CLINICAL DATA:  Patient was holding chest and gagging during bedside speech study. EXAM: ESOPHOGRAM/BARIUM SWALLOW TECHNIQUE: Single contrast examination was performed using isotonic water-soluble contrast-as requested. FLUOROSCOPY TIME:  Fluoroscopy Time:  54 seconds Radiation Exposure Index (if provided by the fluoroscopic device): 1.3 mGy Number of Acquired Spot Images: 0 COMPARISON:  None. FINDINGS: Patient was studied semi recumbent LPO. The esophagus has normal distensibility and motility. No stasis or spasm was noted. There is no detectable mucosal irregularity or mass. No hiatal hernia. Aspiration was never seen. No obstructive process seen in the pharynx. IMPRESSION: Normal single contrast esophagram. Electronically Signed   By: Monte Fantasia M.D.   On: 01/22/2018 13:49    . acetaminophen (TYLENOL) oral liquid 160 mg/5 mL  500 mg Oral BID  . aspirin  81 mg Oral Daily  . chlorhexidine  15 mL Mouth Rinse BID  . collagenase  1 application Topical Daily  . dorzolamide  1 drop Both Eyes BID  . enoxaparin (LOVENOX) injection  50 mg Subcutaneous Q12H  . escitalopram  5 mg Oral QHS  . feeding supplement  1 Container Oral TID BM  . feeding supplement (ENSURE ENLIVE)  237 mL Oral QID  . fluticasone  2 spray Each  Nare Daily  . insulin aspart  0-15 Units Subcutaneous Q6H  . latanoprost  1 drop Both Eyes QHS  . mouth rinse  15 mL Mouth Rinse q12n4p  . mirtazapine  15 mg Oral QHS  . nystatin  5 mL Oral QID  . pantoprazole (PROTONIX) IV  40 mg Intravenous QHS  . sodium chloride flush  5 mL Intravenous Q8H     Assessment/Plan: s/p Procedure(s): DIAGNOSTIC LAPAROSCOPY/ LAPAROTOMY WITH SMALL BOWEL RESECTION TIMES TWO   Drain for fluid collection. multiple organisms, none predominant. Completed 14 day course of antibiotics, now discontinued Repeat CT(1/30)  shows near resolution of other abscess.no enteric connection. ? Tumor nodules vs small abscesses. leave drain in place for now because abscess cavity is still large even though no residual fluid in the cavity.   DVT- full dose lovenox. Continue TPN until eating orally.  No evidence of obstruction at this point.  ? All related to esophageal candidiasis? Insulin SSI per pharmacy Disposition planning-SNF discussions with family.    Palliative care input Thrush tx Repeat CT vs drain injection next week      LOS: 30 days    Stark Klein 01/23/2018

## 2018-01-23 NOTE — Progress Notes (Signed)
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 PMHx: DM, glaucoma, HTN, neuropathy    PT Comments    Pt progressing towards physical therapy goals. Was able to perform transfers with min assist for balance support and safety. Most of therapy time was spent performing peri-care after diarrhea episode. Will continue to follow and progress as able per POC.   Follow Up Recommendations  SNF;Supervision/Assistance - 24 hour     Equipment Recommendations  3in1 (PT)    Recommendations for Other Services       Precautions / Restrictions Precautions Precautions: Fall Precaution Comments: Abdominial incision, sacral wound, and recently has been leaking copious amounts of blood from her vagina?  Use her pullups that are in the room.  Required Braces or Orthoses: Other Brace/Splint Other Brace/Splint: JP drain Restrictions Weight Bearing Restrictions: No    Mobility  Bed Mobility Overal bed mobility: Needs Assistance Bed Mobility: Rolling;Supine to Sit;Sit to Supine Rolling: Min guard;Min assist   Supine to sit: Min assist     General bed mobility comments: Observed rolling to perform peri-care and don a new brief. Pt was able to initiate transition to EOB well, however required assist with bed pad to complete scooting.   Transfers Overall transfer level: Needs assistance Equipment used: 2 person hand held assist Transfers: Sit to/from Stand Sit to Stand: Min assist;+2 safety/equipment Stand pivot transfers: Min assist;+2 safety/equipment;+2 physical assistance       General transfer comment: VC's for hand placement on seated surface for safety. Pt was able to power-up to full standing position and take a few pivotal steps around to the recliner chair.  Appeared controlled with descent to chair.   Ambulation/Gait                 Stairs            Wheelchair Mobility    Modified Rankin (Stroke Patients Only)       Balance Overall balance assessment: Needs assistance Sitting-balance support: Feet supported;No upper extremity supported Sitting balance-Leahy Scale: Good Sitting balance - Comments: poor initially improving to fair by end of session Postural control: Posterior lean Standing balance support: Single extremity supported;Bilateral upper extremity supported Standing balance-Leahy Scale: Poor                              Cognition Arousal/Alertness: Awake/alert Behavior During Therapy: Flat affect Overall Cognitive Status: Impaired/Different from baseline Area of Impairment: Orientation;Attention;Memory;Following commands;Safety/judgement;Awareness;Problem solving                 Orientation Level: Disoriented to;Time;Situation(but is aware that she fell over the weekend) Current Attention Level: Selective Memory: Decreased recall of precautions;Decreased short-term memory Following Commands: Follows one step commands inconsistently;Follows one step commands with increased time Safety/Judgement: Decreased awareness of deficits;Decreased awareness of safety Awareness: Intellectual Problem Solving: Slow processing;Difficulty sequencing;Requires verbal cues        Exercises General Exercises - Lower Extremity Long Arc Quad: Both;10 reps Hip ABduction/ADduction: Both;10 reps Straight Leg Raises: Both;10 reps Hip Flexion/Marching: 10 reps;Seated;Both    General Comments        Pertinent Vitals/Pain Pain Assessment: No/denies pain    Home Living  Prior Function            PT Goals (current goals can now be found in the care plan section) Acute Rehab PT Goals Patient Stated Goal: none PT Goal Formulation: With patient Time For Goal Achievement:  01/28/18 Potential to Achieve Goals: Good Progress towards PT goals: Progressing toward goals    Frequency    Min 2X/week      PT Plan Current plan remains appropriate    Co-evaluation              AM-PAC PT "6 Clicks" Daily Activity  Outcome Measure  Difficulty turning over in bed (including adjusting bedclothes, sheets and blankets)?: A Little Difficulty moving from lying on back to sitting on the side of the bed? : Unable Difficulty sitting down on and standing up from a chair with arms (e.g., wheelchair, bedside commode, etc,.)?: Unable Help needed moving to and from a bed to chair (including a wheelchair)?: A Little Help needed walking in hospital room?: A Little Help needed climbing 3-5 steps with a railing? : A Little 6 Click Score: 14    End of Session Equipment Utilized During Treatment: Gait belt Activity Tolerance: Patient limited by fatigue;Treatment limited secondary to medical complications (Comment) Patient left: in bed;with call bell/phone within reach;with bed alarm set Nurse Communication: Mobility status PT Visit Diagnosis: Other abnormalities of gait and mobility (R26.89);Muscle weakness (generalized) (M62.81) Pain - part of body: (abdomen)     Time: 4332-9518 PT Time Calculation (min) (ACUTE ONLY): 25 min  Charges:  $Gait Training: 8-22 mins $Therapeutic Exercise: 8-22 mins                    G Codes:       Conni Slipper, PT, DPT Acute Rehabilitation Services Pager: [phone]    Marylynn Pearson 01/23/2018, 3:21 PM

## 2018-01-23 NOTE — Progress Notes (Signed)
PHARMACY - ADULT TOTAL PARENTERAL NUTRITION CONSULT NOTE   Pharmacy Consult: TPN Indication: Intolerance to enteral feeding  Patient Measurements: Height: '5\' 5"'  (165.1 cm)(12/04/17) Weight: 119 lb 0.8 oz (54 kg) IBW/kg (Calculated) : 57 TPN AdjBW (KG): 57.6 Body mass index is 19.81 kg/m.  Assessment:  65 YOM with stage IV neuroendocrine small bowel cancer (neuroendocrine carcinomatosis of the omental adipose tissue with bowel lymph nodes metastases)  s/p ex-lap with SBR in December. Patient was re-admitted from SNF after she began having nausea and vomiting and unable to tolerate enteral feeding.  Found to have a large dilated segment of bowel with bezoar and necrosis, now s/p SBR x 2.  Pharmacy managing TPN for nutritional support for FTT.  GI: Albumin low at 1.9. Prealbumin up to 30.9 (BL 5.3), having BMs. LBM 2/5. RLQ drain O/P is minimal. No po intake 2/5 charted. PPI. PRN Zofran/Phenergan with continued N/V. Possible choledocholithiasis on CT, repeat CT 1/30 - postprocedural hematoma vs rapid recurrence of tumor vs small leak collections. 2/4 speech eval with dysphagia. Drain remains in place for intraabdominal abcess  Endo:A1C 7.1 (11/28/17)   .  DM on metformin PTA. CBGs 102-189 on sensitive SSI + regular insulin 25 units daily in TPN.  Insulin requirements in the past 24 hours: 12 units SSI + 25 units in TPN   Lytes:  K 4.1 & continues to trend up. Phos and Mg wnl.  Renal: SCr down to 0.67, BUN 22 - minimal UOP charted. Patient states she is making urine  Pulm: stable on RA Cards: VSS on ASA81  AC: Lovenox acute nonocclusive DVT - hgb low but stable, plts 704k. -Of note, pt had vaginal bleeding 1/27 and continuing, so Lovenox reduced to 47m SQ Q12H by MD, now to 570m12hr per MD., CrCl has improved (> 30 mL/min)  Hepatobil: Alk phos elevated and up, AST/ALT/tbili WNL. TG stable 169 Neuro: confused - Lexapro, Remeron,  morphine prn ID: S/p Zosyn for IAI. 1/21 CT showed  abscess (cx with mult organisms, none predominant), Repeat CT(1/30) showsnear resolution of abscess. afebrile, WBC wnl. Fluconazole for thrush through 2/20.  TPN Access: PICC placed 12/24/17>> pulled out 1/17; replaced 01/04/18>>pulled out again 1/30, replace with R IJ 1/31>> TPN start date: 12/25/17  Nutritional Goals (per RD rec on 1/31): 1500-1700 kCal  75-90 gram protein per day > 1.5 L of fluid per day  Current Nutrition:  TPN Full liquid Boost breeze d/c'ed due to poor acceptance  Plan:  Continue TPN at 65 ml/hr  TPN will provide 90g AA, 234g CHO, and 37g of fat emulsion, which equals 1,532 kCals per day, meeting 100% of patient needs Electrolytes in TPN: continue low Ca and slightly higher Na + K. Max acetate  Daily multivitamin and trace elements in TPN Increase sliding scale to moderate SSI Q6H + increase insulin to TPN at 30 units [facility] MD Monitor TPN labs F/U PO intake/diet advancement and ability to wean TPN Long term plan for feeding?    Asir Bingley S. RobAlford HighlandharmD, BCPFawcett Memorial Hospitalinical Staff Pharmacist Pager [phone]

## 2018-01-23 NOTE — Progress Notes (Signed)
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 On 01/09/2018 she was diagnosed with the right lower quadrant abdominal abscess and underwent placement of the drain and was treated with broad-spectrum antibiotic. She has been vomiting when trying to eat and is now on TNA per general surgery. [facility] were consulted to further investigate her failure to thrive.  Palliative medicine has also been contacted by the primary team.   Subjective: No complaints but does not have an appetite. Refusing to eat and drink.  ROS: no complaints of nausea, vomiting, constipation diarrhea, cough, dyspnea or dysuria. No other complaints.   Assessment & Plan:   Failure to thrive - - unable to tolerate orals -multifactorial, possibly 2/2 intrabdominal infection, malignancy, debility of severe chronic illness -head CT no acute intracranial abnormalities  Abdominal abscess - per primary team and IR  Thrush - Nystatin   DM type II with hypoglycemia-Hypoglycemia has resolved -A1C 7.1 (11/28/17)  -on sensitive SSI  - Has been hyperglycemic since yesterday 01/20/18 -continue close monitoring on TPN and oral supplement  AKI, resolved -Cr increased to 1.35 on 01/18/2018- improved with hydration -Continue gentle hydration at 40cc/hr.    Suspected dysphagia - 01/21/18: per speech therapist suspect primary esophageal dysphagia or other GI component, to consider esophagram and or GI consult - esophagram performed on 2/5 is found to be normal   HTN   -BP stable -IV Hydralazine prn    Hypernatremia and hypokalemia, resolved-  -Na+ 140 from 144 from 150 -K+ 3.8 from 3.5 from  3.0 -Sodium is 142 today, 01/22/2018.  And potassium is 4.  Serum creatinine is 0.65.  Glaucoma - -continue home drops, Xalatan and Trusopt  Hypomagnesemia -replaced  Ambulatory dysfunction -01/21/18: OT recommends SNF  Antimicrobials:  Anti-infectives (From admission, onward)   Start     Dose/Rate Route Frequency Ordered Stop   01/23/18 1200  fluconazole (DIFLUCAN) IVPB 100 mg     100 mg 50 mL/hr over 60 Minutes Intravenous Every 24 hours 01/22/18 1551 02/06/18 1159   01/22/18 1700  fluconazole (DIFLUCAN) IVPB 200 mg     200 mg 100 mL/hr over 60 Minutes Intravenous  Once 01/22/18 1549 01/22/18 1817   12/31/17 1300  piperacillin-tazobactam (ZOSYN) IVPB 3.375 g  Status:  Discontinued     3.375 g 12.5 mL/hr over 240 Minutes Intravenous Every 8 hours 12/31/17 1252 01/18/18 1507       Objective: Vitals:   01/22/18 1404 01/22/18 2132 01/23/18 0451 01/23/18 1442  BP: 125/65 115/60 118/62 121/64  Pulse: 85 86 88 100  Resp: 17 16 15 16   Temp: 98 F (36.7 C) 98.4 F (36.9 C) 98 F (36.7 C) 98 F (36.7 C)  TempSrc: Oral Oral Axillary Oral  SpO2: 99% 98% 99% 100%  Weight:      Height:        Intake/Output Summary (Last 24 hours) at 01/23/2018 1512 Last data filed at 01/23/2018 1213 Gross per 24 hour  Intake 0 ml  Output 20 ml  Net -20 ml   Filed Weights   12/28/17 0500 12/31/17 1148 01/14/18 0537  Weight: 58 kg (127 lb 13.9 oz) 63 kg (138 lb 14.2 oz) 54 kg (119 lb 0.8 oz)  Examination: General exam: Appears comfortable  HEENT: PERRLA, oral mucosa moist, no sclera icterus or thrush Respiratory system: Clear to auscultation. Respiratory effort normal. Cardiovascular system: S1 & S2 heard, RRR.  No murmurs  Gastrointestinal system: Abdomen soft, non-tender, nondistended. Normal bowel sound. No organomegaly Central nervous system: Alert and oriented. No focal neurological deficits. Extremities: No cyanosis, clubbing or edema Skin: No rashes or ulcers Psychiatry:   Mood & affect appropriate.     Data Reviewed: I have personally reviewed following labs and imaging studies  CBC: Recent Labs  Lab 01/17/18 0647 01/20/18 1250 01/21/18 0409  WBC 8.1 7.4 7.0  NEUTROABS  --   --  4.5  HGB 8.4* 8.6* 8.7*  HCT 28.8* 28.2* 28.8*  MCV 89.4 89.5 90.3  PLT 746* 690* 704*   Basic Metabolic Panel: Recent Labs  Lab 01/17/18 0647 01/18/18 0429 01/19/18 0432 01/21/18 0409 01/22/18 0532 01/23/18 0422  NA 150* 144 143 140 142 139  K 3.0* 3.5 3.7 3.8 4.0 4.1  CL 115* 110 110 108 106 105  CO2 22 25 26 24 25 24   GLUCOSE 127* 151* 246* 184* 155* 109*  BUN 28* 25* 24* 19 21* 22*  CREATININE 1.20* 1.35* 0.91 0.62 0.65 0.67  CALCIUM 8.5* 8.1* 8.3* 8.5* 8.6* 8.3*  MG 1.9 1.9  --  1.7 2.0 2.0  PHOS 3.3 2.8  --  2.7 3.2  --    GFR: Estimated Creatinine Clearance: 44.6 mL/min (by C-G formula based on SCr of 0.67 mg/dL). Liver Function Tests: Recent Labs  Lab 01/17/18 0647 01/21/18 0409  AST 25 34  ALT 31 34  ALKPHOS 134* 156*  BILITOT 0.7 0.4  PROT 6.0* 5.7*  ALBUMIN 1.9* 1.9*   No results for input(s): LIPASE, AMYLASE in the last 168 hours. No results for input(s): AMMONIA in the last 168 hours. Coagulation Profile: Recent Labs  Lab 01/17/18 0718  INR 1.25   Cardiac Enzymes: No results for input(s): CKTOTAL, CKMB, CKMBINDEX, TROPONINI in the last 168 hours. BNP (last 3 results) No results for input(s): PROBNP in the last 8760 hours. HbA1C: No results for input(s): HGBA1C in the last 72 hours. CBG: Recent Labs  Lab 01/22/18 2037 01/22/18 2346 01/23/18 0354 01/23/18 0759 01/23/18 1159  GLUCAP 123* 129* 102* 149* 190*   Lipid Profile: Recent Labs    01/21/18 0409  TRIG 169*   Thyroid Function Tests: No results for input(s): TSH, T4TOTAL, FREET4, T3FREE, THYROIDAB in the last 72 hours. Anemia Panel: No results for input(s): VITAMINB12, FOLATE, FERRITIN, TIBC, IRON, RETICCTPCT in the last 72 hours. Urine analysis:     Component Value Date/Time   COLORURINE RED (A) 01/14/2018 1500   APPEARANCEUR TURBID (A) 01/14/2018 1500   LABSPEC NOT CALCULATED 01/14/2018 1500   PHURINE NOT CALCULATED 01/14/2018 1500   GLUCOSEU (A) 01/14/2018 1500    TEST NOT REPORTED DUE TO COLOR INTERFERENCE OF URINE PIGMENT   HGBUR (A) 01/14/2018 1500    TEST NOT REPORTED DUE TO COLOR INTERFERENCE OF URINE PIGMENT   BILIRUBINUR (A) 01/14/2018 1500    TEST NOT REPORTED DUE TO COLOR INTERFERENCE OF URINE PIGMENT   KETONESUR (A) 01/14/2018 1500    TEST NOT REPORTED DUE TO COLOR INTERFERENCE OF URINE PIGMENT   PROTEINUR (A) 01/14/2018 1500    TEST NOT REPORTED DUE TO COLOR INTERFERENCE OF URINE PIGMENT   NITRITE (A) 01/14/2018 1500    TEST NOT REPORTED DUE TO COLOR INTERFERENCE OF URINE PIGMENT   LEUKOCYTESUR (A) 01/14/2018 1500  TEST NOT REPORTED DUE TO COLOR INTERFERENCE OF URINE PIGMENT   Sepsis Labs: @LABRCNTIP (procalcitonin:4,lacticidven:4) )No results found for this or any previous visit (from the past 240 hour(s)).       Radiology Studies: Dg Esophagus W/water Sol Cm  Result Date: 01/22/2018 CLINICAL DATA:  Patient was holding chest and gagging during bedside speech study. EXAM: ESOPHOGRAM/BARIUM SWALLOW TECHNIQUE: Single contrast examination was performed using isotonic water-soluble contrast-as requested. FLUOROSCOPY TIME:  Fluoroscopy Time:  54 seconds Radiation Exposure Index (if provided by the fluoroscopic device): 1.3 mGy Number of Acquired Spot Images: 0 COMPARISON:  None. FINDINGS: Patient was studied semi recumbent LPO. The esophagus has normal distensibility and motility. No stasis or spasm was noted. There is no detectable mucosal irregularity or mass. No hiatal hernia. Aspiration was never seen. No obstructive process seen in the pharynx. IMPRESSION: Normal single contrast esophagram. Electronically Signed   By: Marnee Spring M.D.   On: 01/22/2018 13:49      Scheduled Meds: . acetaminophen (TYLENOL)  oral liquid 160 mg/5 mL  500 mg Oral BID  . aspirin  81 mg Oral Daily  . chlorhexidine  15 mL Mouth Rinse BID  . collagenase  1 application Topical Daily  . dorzolamide  1 drop Both Eyes BID  . enoxaparin (LOVENOX) injection  50 mg Subcutaneous Q12H  . escitalopram  5 mg Oral QHS  . feeding supplement  1 Container Oral TID BM  . feeding supplement (ENSURE ENLIVE)  237 mL Oral QID  . fluticasone  2 spray Each Nare Daily  . insulin aspart  0-15 Units Subcutaneous Q6H  . latanoprost  1 drop Both Eyes QHS  . mouth rinse  15 mL Mouth Rinse q12n4p  . mirtazapine  15 mg Oral QHS  . nystatin  5 mL Oral QID  . pantoprazole (PROTONIX) IV  40 mg Intravenous QHS  . sodium chloride flush  5 mL Intravenous Q8H   Continuous Infusions: . sodium chloride    . sodium chloride    . sodium chloride 40 mL/hr at 01/23/18 1234  . fluconazole (DIFLUCAN) IV Stopped (01/23/18 1334)  . lactated ringers 10 mL/hr at 01/14/18 2117  . TPN ADULT (ION) 65 mL/hr at 01/22/18 1705  . TPN ADULT (ION)       LOS: 30 days    Time spent in minutes: 35    Calvert Cantor, MD [facility] Pager: [email or web address] Password [email or web address] 01/23/2018, 3:12 PM

## 2018-01-24 DIAGNOSIS — Z7189 Other specified counseling: Secondary | ICD-10-CM

## 2018-01-24 LAB — PHOSPHORUS: PHOSPHORUS: 3 mg/dL (ref 2.5–4.6)

## 2018-01-24 LAB — CBC
HEMATOCRIT: 27.8 % — AB (ref 36.0–46.0)
Hemoglobin: 8.5 g/dL — ABNORMAL LOW (ref 12.0–15.0)
MCH: 27.3 pg (ref 26.0–34.0)
MCHC: 30.6 g/dL (ref 30.0–36.0)
MCV: 89.4 fL (ref 78.0–100.0)
PLATELETS: 592 10*3/uL — AB (ref 150–400)
RBC: 3.11 MIL/uL — AB (ref 3.87–5.11)
RDW: 20.1 % — ABNORMAL HIGH (ref 11.5–15.5)
WBC: 5.7 10*3/uL (ref 4.0–10.5)

## 2018-01-24 LAB — COMPREHENSIVE METABOLIC PANEL
ALT: 24 U/L (ref 14–54)
ANION GAP: 9 (ref 5–15)
AST: 27 U/L (ref 15–41)
Albumin: 1.9 g/dL — ABNORMAL LOW (ref 3.5–5.0)
Alkaline Phosphatase: 159 U/L — ABNORMAL HIGH (ref 38–126)
BILIRUBIN TOTAL: 0.4 mg/dL (ref 0.3–1.2)
BUN: 19 mg/dL (ref 6–20)
CHLORIDE: 105 mmol/L (ref 101–111)
CO2: 24 mmol/L (ref 22–32)
Calcium: 8.5 mg/dL — ABNORMAL LOW (ref 8.9–10.3)
Creatinine, Ser: 0.7 mg/dL (ref 0.44–1.00)
Glucose, Bld: 101 mg/dL — ABNORMAL HIGH (ref 65–99)
POTASSIUM: 3.9 mmol/L (ref 3.5–5.1)
Sodium: 138 mmol/L (ref 135–145)
Total Protein: 5.5 g/dL — ABNORMAL LOW (ref 6.5–8.1)

## 2018-01-24 LAB — GLUCOSE, CAPILLARY
GLUCOSE-CAPILLARY: 150 mg/dL — AB (ref 65–99)
GLUCOSE-CAPILLARY: 249 mg/dL — AB (ref 65–99)
Glucose-Capillary: 113 mg/dL — ABNORMAL HIGH (ref 65–99)
Glucose-Capillary: 176 mg/dL — ABNORMAL HIGH (ref 65–99)
Glucose-Capillary: 178 mg/dL — ABNORMAL HIGH (ref 65–99)

## 2018-01-24 LAB — MAGNESIUM: MAGNESIUM: 1.7 mg/dL (ref 1.7–2.4)

## 2018-01-24 MED ORDER — MAGIC MOUTHWASH: 15.0000 mL | Freq: Three times a day (TID) | ORAL | 0 refills | Status: DC

## 2018-01-24 MED ORDER — CLOTRIMAZOLE 10 MG MT TROC: 10.0000 mg | Freq: Every day | OROMUCOSAL | 0 refills | Status: DC

## 2018-01-24 MED ORDER — ONDANSETRON 4 MG PO TBDP
4.0000 mg | ORAL_TABLET | Freq: Two times a day (BID) | ORAL | Status: AC
  Administered 2018-01-24 – 2018-01-25 (×3): 4 mg via ORAL
  Filled 2018-01-24 (×3): qty 1

## 2018-01-24 MED ORDER — CHLORHEXIDINE GLUCONATE 0.12 % MT SOLN: 15.0000 mL | Freq: Two times a day (BID) | OROMUCOSAL | 0 refills | Status: DC

## 2018-01-24 MED ORDER — CLOTRIMAZOLE 10 MG MT TROC
10.0000 mg | Freq: Every day | OROMUCOSAL | Status: DC
  Administered 2018-01-24 – 2018-01-25 (×7): 10 mg via ORAL
  Filled 2018-01-24 (×11): qty 1

## 2018-01-24 MED ORDER — ONDANSETRON 4 MG PO TBDP: 4.0000 mg | ORAL_TABLET | Freq: Four times a day (QID) | ORAL | 0 refills | Status: DC | PRN

## 2018-01-24 MED ORDER — MAGIC MOUTHWASH
15.0000 mL | Freq: Three times a day (TID) | ORAL | Status: DC
  Administered 2018-01-24 – 2018-01-25 (×4): 15 mL via ORAL
  Filled 2018-01-24 (×4): qty 15

## 2018-01-24 MED ORDER — FLUCONAZOLE IN SODIUM CHLORIDE 200-0.9 MG/100ML-% IV SOLN
200.0000 mg | INTRAVENOUS | Status: DC
  Administered 2018-01-24 – 2018-01-25 (×2): 200 mg via INTRAVENOUS
  Filled 2018-01-24 (×3): qty 100

## 2018-01-24 MED ORDER — ENOXAPARIN SODIUM 60 MG/0.6ML ~~LOC~~ SOLN: 50.0000 mg | Freq: Two times a day (BID) | SUBCUTANEOUS | 0 refills | Status: DC

## 2018-01-24 MED ORDER — BOOST / RESOURCE BREEZE PO LIQD CUSTOM
1.0000 | Freq: Two times a day (BID) | ORAL | Status: DC
  Administered 2018-01-25: 1 via ORAL

## 2018-01-24 MED ORDER — FLUCONAZOLE IN SODIUM CHLORIDE 200-0.9 MG/100ML-% IV SOLN: 200.0000 mg | INTRAVENOUS | 0 refills | Status: DC

## 2018-01-24 MED ORDER — ENSURE ENLIVE PO LIQD
237.0000 mL | Freq: Two times a day (BID) | ORAL | Status: DC
  Administered 2018-01-24: 237 mL via ORAL

## 2018-01-24 MED ORDER — OCTREOTIDE ACETATE 50 MCG/ML IJ SOLN
50.0000 ug | Freq: Two times a day (BID) | INTRAMUSCULAR | Status: DC
  Administered 2018-01-24 – 2018-01-25 (×3): 50 ug via SUBCUTANEOUS
  Filled 2018-01-24 (×4): qty 1

## 2018-01-24 MED ORDER — PANTOPRAZOLE SODIUM 40 MG IV SOLR: 40.0000 mg | Freq: Every day | INTRAVENOUS | 0 refills | Status: DC

## 2018-01-24 MED ORDER — TRAVASOL 10 % IV SOLN
INTRAVENOUS | Status: AC
  Administered 2018-01-24: 17:00:00 via INTRAVENOUS
  Filled 2018-01-24: qty 904.8

## 2018-01-24 NOTE — Progress Notes (Signed)
PHARMACY - ADULT TOTAL PARENTERAL NUTRITION CONSULT NOTE   Pharmacy Consult: TPN Indication: Intolerance to enteral feeding  Patient Measurements: Height: _0  (165.1 cm)(12/04/17) Weight: 119 lb 7.8 oz (54.2 kg) IBW/kg (Calculated) : 57 TPN AdjBW (KG): 57.6 Body mass index is 19.88 kg/m.  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 Patient was re-admitted from SNF after she began having nausea and vomiting and unable to tolerate enteral feeding.  Found to have a large dilated segment of bowel with bezoar and necrosis, now s/p SBR x 2.  Pharmacy managing TPN for nutritional support for FTT.  GI: Albumin low at 1.9. Prealbumin up to 30.9 (BL 5.3), having BMs. LBM 2/5. RLQ drain O/P is minimal. 100cc po intake 2/6 charted. PPI. PRN Zofran/Phenergan with continued N/V. Possible choledocholithiasis on CT, repeat CT 1/30 - postprocedural hematoma vs rapid recurrence of tumor vs small leak collections. 2/4 speech eval with dysphagia. Drain remains in place for intraabdominal abscess. Family to discuss feeding tube per palliative note. MD notes gagging. - 2/6: 2 doses of Boost and 2 doses of Ensure charted as "given" but maybe not actually consumed?  Endo:A1C 7.1 (11/28/17) .  DM on metformin PTA. CBGs 113-190 on sensitive SSI + regular insulin 30 units daily in TPN.  Insulin requirements in the past 24 hours: 11 units SSI + 25 units in TPN   Lytes:  K 3.9 stabilized. Mg down to 1.7  Renal: SCr down to 0.7, BUN 19 - minimal UOP charted. Patient states she is making urine  Pulm: stable on RA Cards: BP/HR elevated on ASA81  AC: Lovenox acute nonocclusive DVT - hgb low but stable, plts 592k. -Of note, pt had vaginal bleeding 1/27 and continuing, so Lovenox reduced to 71m SQ Q12H by MD, now back to 512m12hr per MD., CrCl has improved (> 30  mL/min)  Hepatobil: Alk phos elevated and up, AST/ALT/tbili WNL. TG stable 169 Neuro: confused - Lexapro, Remeron,  morphine prn ID: S/p Zosyn for IAI. 1/21 CT showed abscess (cx with mult organisms, none predominant), Repeat CT(1/30) showsnear resolution of abscess. afebrile, WBC wnl. Fluconazole for thrush through 2/20.  TPN Access: PICC placed 12/24/17>> pulled out 1/17; replaced 01/04/18>>pulled out again 1/30, replace with R IJ 1/31>>  TPN start date: 12/25/17  Nutritional Goals (per RD rec on 1/31): 1500-1700 kCal  75-90 gram protein per day > 1.5 L of fluid per day  Current Nutrition:  TPN Full liquid   Plan:  Continue TPN at 65 ml/hr  TPN will provide 90g AA, 234g CHO, and 37g of fat emulsion, which equals 1,532 kCals per day, meeting 100% of patient needs Electrolytes in TPN: continue low Ca and slightly higher Na + K. Max acetate  Daily multivitamin and trace elements in TPN Increase sliding scale to moderate SSI Q6H + increase insulin to TPN at 35 units Continue [facility] 4019mr per MD Monitor TPN labs-none for 2/8 F/U PO intake/diet advancement and ability to wean TPN Long term plan for feeding?   Theresa Howe S. RobAlford HighlandharmD, BCPGi Wellness Center Of Frederickinical Staff Pharmacist Pager [phone] [phone]

## 2018-01-24 NOTE — Progress Notes (Signed)
Nutrition Follow-up  DOCUMENTATION CODES:   Severe malnutrition in context of chronic illness  INTERVENTION:   -TPN management per pharmacy -Boost Breeze po BID, each supplement provides 250 kcal and 9 grams of protein -Ensure Enlive po BID, each supplement provides 350 kcal and 20 grams of protein  NUTRITION DIAGNOSIS:   Severe Malnutrition related to chronic illness(stage IV neuroendocrine tumor) as evidenced by energy intake < 75% for > or equal to 1 month, severe fat depletion, severe muscle depletion.  Ongoing  GOAL:   Patient will meet greater than or equal to 90% of their needs  Met with TPN  MONITOR:   Diet advancement, Labs, Weight trends, I & O's, Skin, Other (Comment)(TPN)  REASON FOR ASSESSMENT:   Consult Calorie Count  ASSESSMENT:   Patient is a 84 year old female who is status post laparoscopic assisted small bowel resection in mid December.  She was discharged to a skilled nursing facility.  She was doing better, but now is throwing up almost everything that she eats.  She is able to keep down a small amount of Ensure.  She also continues to have some postop pain.  She denies fevers and chills.  She is having difficulty working with physical therapy due to weakness.  She is also having difficulty taking her medication because of nausea and vomiting.  1/7- PICC placed 1/10- s/pDiagnostic laparoscopy, exploratory laparotomy, small bowel resection x 2. 1/11- NGT d/c 1/12- NGT reinserted, d/c 1/23- s/p dran placement for RLQ fluid and gas collection (90 ml yielded) due to intra-abdominal abscess 1/30- repeat CT reveals showsnear resolution of other abscess 2/1- per IR notes, drain injection demonstrates opacification of moderate size residual decompressed abscess cavity without fisutlous connection  Spoke with pt at bedside, who has been more interactive then during the last few RD visits. She reports she was able to consume some supplements and juices off  her meal tray today. She shares that her nausea has improved; pt consumed about 1/3 of Ensure supplement on tray table.   Case discussed with RN, who reports pt with continued poor oral intake, however, will consume supplements with a lot of encouragement. RN has not noticed any vomiting today. Plan is for [facility] referral, which pt will transfer to if qualifies and medically stable.   Pt remains TPN dependent. Per pharmacy note, plan to continue TPN at 65 ml/hr, which will provide 90g AA, 234g CHO, and 37g of fat emulsion, which equals1,532kCals per day, meeting 100% of patient needs.   Palliative care team continues to follow.   Labs reviewed: CBGS: [id number] (inpatient orders for glycemic control are 0-15 units insulin aspart every 6 hours).   NUTRITION - FOCUSED PHYSICAL EXAM:    Most Recent Value  Orbital Region  Severe depletion  Upper Arm Region  Severe depletion  Thoracic and Lumbar Region  Severe depletion  Buccal Region  Severe depletion  Temple Region  Severe depletion  Clavicle Bone Region  Severe depletion  Clavicle and Acromion Bone Region  Severe depletion  Scapular Bone Region  Severe depletion  Dorsal Hand  Severe depletion  Patellar Region  Severe depletion  Anterior Thigh Region  Severe depletion  Posterior Calf Region  Severe depletion  Edema (RD Assessment)  None  Hair  Reviewed  Eyes  Reviewed  Mouth  Reviewed  Skin  Reviewed  Nails  Reviewed       Diet Order:  Fall precautions TPN ADULT (ION) TPN ADULT (ION) DIET SOFT Room service appropriate? Yes; Fluid consistency:  Thin Diet - low sodium heart healthy  EDUCATION NEEDS:   Education needs have been addressed  Skin:  Skin Assessment: Skin Integrity Issues: Skin Integrity Issues:: Stage II, Incisions, Stage III Stage II: buttocks Stage III: coccyx Incisions: abdomen  Last BM:  01/23/18  Height:   Ht Readings from Last 1 Encounters:  12/24/17 _0  (1.651 m)    Weight:   Wt Readings from  Last 1 Encounters:  01/24/18 119 lb 7.8 oz (54.2 kg)    Ideal Body Weight:  56.8 kg  BMI:  Body mass index is 19.88 kg/m.  Estimated Nutritional Needs:   Kcal:  1500-1700  Protein:  75-90 grams  Fluid:  > 1.5 L    Keven Osborn A. Jimmye Norman, RD, LDN, CDE Pager: [phone] After hours Pager: [phone]

## 2018-01-24 NOTE — Discharge Instructions (Signed)
Tunneled Catheter Insertion, Care After °Refer to this sheet in the next few weeks. These instructions provide you with information about caring for yourself after your procedure. Your health care provider may also give you more specific instructions. Your treatment has been planned according to current medical practices, but problems sometimes occur. Call your health care provider if you have any problems or questions after your procedure. °What can I expect after the procedure? °After the procedure, it is common to have: °· Some mild redness, swelling, and pain around your catheter site. °· A small amount of blood or clear fluid coming from your incisions. ° °Follow these instructions at home: °Incision care °· Check your incision areas every day for signs of infection. Check for: °? More redness, swelling, or pain. °? More fluid or blood. °? Warmth. °? Pus or a bad smell. °· Follow instructions from your health care provider about how to take care of your incisions. Make sure you: °? Wash your hands with soap and water before you change your bandages (dressings). If soap and water are not available, use hand sanitizer. °? Change your dressings as told by your health care provider. Wash the area around your incisions with a germ-killing (antiseptic) solution when you change your dressing, as told by your health care provider. °? Leave stitches (sutures), skin glue, or adhesive strips in place. These skin closures may need to stay in place for 2 weeks or longer. If adhesive strip edges start to loosen and curl up, you may trim the loose edges. Do not remove adhesive strips completely unless your health care provider tells you to do that. °Catheter Care ° °· Wash your hands with soap and water before and after caring for your catheter. If soap and water are not available, use hand sanitizer. °· Keep your catheter site and your dressings clean and dry. °· Apply an antibiotic ointment to your catheter site as told by  your health care provider. °· Flush your catheter as told by your health care provider. This helps prevent it from becoming clogged. °· Do not open the caps on the ends of the catheter. °· Do not pull on your catheter. °· If your catheter is in your arm: °? Avoid wearing tight clothes or tight jewelry on your arm that has the catheter. °? Do not sleep with your head on the arm that has the catheter. °? Do not allow your blood pressure to be taken on the arm that has the catheter. °? Do not allow your blood to be drawn from the arm that has the catheter, except through the catheter itself. °Medicines °· Take over-the-counter and prescription medicines only as told by your health care provider. °· If you were prescribed an antibiotic medicine, take it as told by your health care provider. Do not stop taking the antibiotic even if you start to feel better. °Activity °· Return to your normal activities as told by your health care provider. Ask your health care provider what activities are safe for you. °· Do not lift anything that is heavier than 10 lb (4.5 kg) for 3 weeks or as long as told by your health care provider. °Driving °· Do not drive until your health care provider approves. °· Do not drive or operate heavy machinery while taking prescription pain medicine. °General instructions °· Follow your health care provider's specific instructions for the type of catheter that you have. °· Do not take baths, swim, or use a hot tub until your health   care provider approves. °· Follow instructions from your health care provider about eating or drinking restrictions. °· Wear compression stockings as told by your health care provider. These stockings help to prevent blood clots and reduce swelling in your legs. °· Keep all follow-up visits as told by your health care provider. This is important. °Contact a health care provider if: °· You have more fluid or blood coming from your incisions. °· You have more redness,  swelling, or pain at your incisions or around the area where your catheter is inserted. °· Your incisions feel warm to the touch. °· You feel unusually weak. °· You feel nauseous. °· Your catheter is not working properly. °· You have blood or fluid draining from your catheter. °· You are unable to flush your catheter. °Get help right away if: °· Your catheter breaks. °· A hole develops in your catheter. °· Your catheter comes loose or gets pulled completely out. If this happens, press on your catheter site firmly with your hand or a clean cloth until you get medical help. °· Your catheter becomes blocked. °· You have swelling in your arm, shoulder, neck, or face. °· You develop chest pain. °· You have difficulty breathing. °· You feel dizzy or light-headed. °· You have pus or a bad smell coming from your incisions. °· You have a fever. °· You develop bleeding from your catheter or your insertion site, and your bleeding does not stop. °This information is not intended to replace advice given to you by your health care provider. Make sure you discuss any questions you have with your health care provider. °Document Released: 11/20/2012 Document Revised: 08/06/2016 Document Reviewed: 08/30/2015 °Elsevier Interactive Patient Education © 2018 [employer] °Moderate Conscious Sedation, Adult, Care After °These instructions provide you with information about caring for yourself after your procedure. Your health care provider may also give you more specific instructions. Your treatment has been planned according to current medical practices, but problems sometimes occur. Call your health care provider if you have any problems or questions after your procedure. °What can I expect after the procedure? °After your procedure, it is common: °· To feel sleepy for several hours. °· To feel clumsy and have poor balance for several hours. °· To have poor judgment for several hours. °· To vomit if you eat too soon. ° °Follow these  instructions at home: °For at least 24 hours after the procedure: ° °· Do not: °? Participate in activities where you could fall or become injured. °? Drive. °? Use heavy machinery. °? Drink alcohol. °? Take sleeping pills or medicines that cause drowsiness. °? Make important decisions or sign legal documents. °? Take care of children on your own. °· Rest. °Eating and drinking °· Follow the diet recommended by your health care provider. °· If you vomit: °? Drink water, juice, or soup when you can drink without vomiting. °? Make sure you have little or no nausea before eating solid foods. °General instructions °· Have a responsible adult stay with you until you are awake and alert. °· Take over-the-counter and prescription medicines only as told by your health care provider. °· If you smoke, do not smoke without supervision. °· Keep all follow-up visits as told by your health care provider. This is important. °Contact a health care provider if: °· You keep feeling nauseous or you keep vomiting. °· You feel light-headed. °· You develop a rash. °· You have a fever. °Get help right away if: °· You have trouble   breathing. This information is not intended to replace advice given to you by your health care provider. Make sure you discuss any questions you have with your health care provider. Document Released: 09/24/2013 Document Revised: 05/08/2016 Document Reviewed: 03/25/2016 Elsevier Interactive Patient Education  2018 [facility], [place] [phone]  ABDOMINAL SURGERY: POST OP INSTRUCTIONS  Always review your discharge instruction sheet given to you by the facility where your surgery was performed.  IF YOU HAVE DISABILITY OR FAMILY LEAVE FORMS, YOU MUST BRING THEM TO THE OFFICE FOR PROCESSING.  PLEASE DO NOT GIVE THEM TO YOUR DOCTOR.  1. A prescription for pain medication may be given to you upon discharge.  Take your pain medication as prescribed, if needed.  If  narcotic pain medicine is not needed, then you may take acetaminophen (Tylenol) or ibuprofen (Advil) as needed. 2. Take your usually prescribed medications unless otherwise directed. 3. If you need a refill on your pain medication, please contact your pharmacy. They will contact our office to request authorization.  Prescriptions will not be filled after 5pm or on week-ends. 4. You should follow a light diet the first few days after arrival home, such as soup and crackers, pudding, etc.unless your doctor has advised otherwise. A high-fiber, low fat diet can be resumed as tolerated.   Be sure to include lots of fluids daily. Most patients will experience some swelling and bruising on the chest and neck area.  Ice packs will help.  Swelling and bruising can take several days to resolve 5. Most patients will experience some swelling and bruising in the area of the incision. Ice pack will help. Swelling and bruising can take several days to resolve..  6. It is common to experience some constipation if taking pain medication after surgery.  Increasing fluid intake and taking a stool softener will usually help or prevent this problem from occurring.  A mild laxative (Milk of Magnesia or Miralax) should be taken according to package directions if there are no bowel movements after 48 hours. 7.  You may have steri-strips (small skin tapes) in place directly over the incision.  These strips should be left on the skin for 10-14 days.  If your surgeon used skin glue on the incision, you may shower in 48 hours.  The glue will flake off over the next 2-3 weeks.  Any sutures or staples will be removed at the office during your follow-up visit. You may find that a light gauze bandage over your incision may keep your staples from being rubbed or pulled. You may shower and replace the bandage daily. 8. ACTIVITIES:  You may resume regular (light) daily activities beginning the next day--such as daily self-care, walking,  climbing stairs--gradually increasing activities as tolerated.  You may have sexual intercourse when it is comfortable.  Refrain from any heavy lifting or straining until approved by your doctor. a. You may drive when you no longer are taking prescription pain medication, you can comfortably wear a seatbelt, and you can safely maneuver your car and apply brakes b. Return to Work: __________8 weeks if applicable_________________________ 77. You should see your doctor in the office for a follow-up appointment approximately two weeks after your surgery.  Make sure that you call for this appointment within a day or two after you arrive home to insure a convenient appointment time. OTHER INSTRUCTIONS:  _____________________________________________________________ _____________________________________________________________  WHEN TO CALL YOUR DOCTOR: 1. Fever over 101.0 2. Inability to urinate  3. Nausea and/or vomiting 4. Extreme swelling or bruising 5. Continued bleeding from incision. 6. Increased pain, redness, or drainage from the incision. 7. Difficulty swallowing or breathing 8. Muscle cramping or spasms. 9. Numbness or tingling in hands or feet or around lips.  The clinic staff is available to answer your questions during regular business hours.  Please dont hesitate to call and ask to speak to one of the nurses if you have concerns.  For further questions, please visit [email or web address]

## 2018-01-24 NOTE — Progress Notes (Signed)
28 Days Post-Op  Subjective: Still having some gagging.  Says she feels a bit better.    Objective: Vital signs in last 24 hours: Temp:  [98 F (36.7 C)-98.6 F (37 C)] 98.3 F (36.8 C) (02/07 0800) Pulse Rate:  [99-106] 106 (02/07 0800) Resp:  [16-20] 18 (02/07 0800) BP: (121-167)/(64-81) 167/81 (02/07 0800) SpO2:  [98 %-100 %] 100 % (02/07 0800) Weight:  [54.2 kg (119 lb 7.8 oz)] 54.2 kg (119 lb 7.8 oz) (02/07 0533) Last BM Date: 01/23/18  Intake/Output from previous day: 02/06 0701 - 02/07 0700 In: 3096.3 [P.O.:100; I.V.:2991.3] Out: 25 [Drains:25] Intake/Output this shift: No intake/output data recorded.  PE General appearance: OOB to chair.   Neuro: weak, but intact.   Resp: breathing comfortably GI: soft, non-tender; RLQ drain serous. Midline wound healing.        Lab Results:  Results for orders placed or performed during the hospital encounter of 12/24/17 (from the past 24 hour(s))  Glucose, capillary     Status: Abnormal   Collection Time: 01/23/18 11:59 AM  Result Value Ref Range   Glucose-Capillary 190 (H) 65 - 99 mg/dL  Glucose, capillary     Status: Abnormal   Collection Time: 01/23/18  4:22 PM  Result Value Ref Range   Glucose-Capillary 161 (H) 65 - 99 mg/dL  Glucose, capillary     Status: Abnormal   Collection Time: 01/23/18  6:59 PM  Result Value Ref Range   Glucose-Capillary 165 (H) 65 - 99 mg/dL  Glucose, capillary     Status: Abnormal   Collection Time: 01/24/18 12:44 AM  Result Value Ref Range   Glucose-Capillary 178 (H) 65 - 99 mg/dL  Comprehensive metabolic panel     Status: Abnormal   Collection Time: 01/24/18  3:27 AM  Result Value Ref Range   Sodium 138 135 - 145 mmol/L   Potassium 3.9 3.5 - 5.1 mmol/L   Chloride 105 101 - 111 mmol/L   CO2 24 22 - 32 mmol/L   Glucose, Bld 101 (H) 65 - 99 mg/dL   BUN 19 6 - 20 mg/dL   Creatinine, Ser 0.70 0.44 - 1.00 mg/dL   Calcium 8.5 (L) 8.9 - 10.3 mg/dL   Total Protein 5.5 (L) 6.5 - 8.1 g/dL    Albumin 1.9 (L) 3.5 - 5.0 g/dL   AST 27 15 - 41 U/L   ALT 24 14 - 54 U/L   Alkaline Phosphatase 159 (H) 38 - 126 U/L   Total Bilirubin 0.4 0.3 - 1.2 mg/dL   GFR calc non Af Amer >60 >60 mL/min   GFR calc Af Amer >60 >60 mL/min   Anion gap 9 5 - 15  Magnesium     Status: None   Collection Time: 01/24/18  3:27 AM  Result Value Ref Range   Magnesium 1.7 1.7 - 2.4 mg/dL  Phosphorus     Status: None   Collection Time: 01/24/18  3:27 AM  Result Value Ref Range   Phosphorus 3.0 2.5 - 4.6 mg/dL  CBC     Status: Abnormal   Collection Time: 01/24/18  3:27 AM  Result Value Ref Range   WBC 5.7 4.0 - 10.5 K/uL   RBC 3.11 (L) 3.87 - 5.11 MIL/uL   Hemoglobin 8.5 (L) 12.0 - 15.0 g/dL   HCT 27.8 (L) 36.0 - 46.0 %   MCV 89.4 78.0 - 100.0 fL   MCH 27.3 26.0 - 34.0 pg   MCHC 30.6 30.0 - 36.0 g/dL  RDW 20.1 (H) 11.5 - 15.5 %   Platelets 592 (H) 150 - 400 K/uL  Glucose, capillary     Status: Abnormal   Collection Time: 01/24/18  5:36 AM  Result Value Ref Range   Glucose-Capillary 113 (H) 65 - 99 mg/dL     Studies/Results: No results found.  Marland Kitchen acetaminophen (TYLENOL) oral liquid 160 mg/5 mL  500 mg Oral BID  . aspirin  81 mg Oral Daily  . chlorhexidine  15 mL Mouth Rinse BID  . clotrimazole  10 mg Oral 5 X Daily  . collagenase  1 application Topical Daily  . dorzolamide  1 drop Both Eyes BID  . enoxaparin (LOVENOX) injection  50 mg Subcutaneous Q12H  . escitalopram  5 mg Oral QHS  . feeding supplement  1 Container Oral TID BM  . feeding supplement (ENSURE ENLIVE)  237 mL Oral QID  . fluticasone  2 spray Each Nare Daily  . insulin aspart  0-15 Units Subcutaneous Q6H  . latanoprost  1 drop Both Eyes QHS  . mouth rinse  15 mL Mouth Rinse q12n4p  . mirtazapine  15 mg Oral QHS  . pantoprazole (PROTONIX) IV  40 mg Intravenous QHS  . sodium chloride flush  5 mL Intravenous Q8H     Assessment/Plan: s/p Procedure(s): DIAGNOSTIC LAPAROSCOPY/ LAPAROTOMY WITH SMALL BOWEL RESECTION TIMES  TWO   Drain for fluid collection. multiple organisms, none predominant. Completed 14 day course of antibiotics, now discontinued  Repeat CT(1/30)  shows near resolution of other abscess.no enteric connection. ? Tumor nodules vs small abscesses. leave drain in place for now because abscess cavity is still large even though no residual fluid in the cavity.  Recheck tomorrow vs next week. Will d/w IR DVT- full dose lovenox. Continue TPN until eating orally.  No evidence of obstruction at this point.  ? All related to esophageal candidiasis? Insulin SSI per pharmacy Disposition planning-SNF vs LTAC   Palliative care input Thrush tx Repeat CT vs drain injection possibly tomorrow.       LOS: 31 days    Stark Klein 01/24/2018

## 2018-01-24 NOTE — Care Management (Addendum)
  [facility] returned call.  [facility] have both offered beds. Explained LTAC and the two LTAC's locations to [place]. Theresa Howe  accepted bed at [facility].  Corrina with [facility] aware and will call Theresa Howe. Paged Dr Barry Dienes. Bedside nurse aware . 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 Magdalen Spatz RN BSN [phone]     Corrina with [facility] is available to offer a bed late today or tomorrow. Dr Barry Dienes aware and in agreement if family is. No family at bedside , called nephew [facility] Theresa Howe [phone] , no answer , voicemail full, sent SMS message with direct call back number. Bedside nurse aware, and will notify NCM if family comes to visit. Will keep attempting to call Theresa Howe.   Magdalen Spatz RN BSN [phone]

## 2018-01-24 NOTE — Progress Notes (Addendum)
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 On 01/09/2018 she was diagnosed with the right lower quadrant abdominal abscess and underwent placement of the drain and was treated with broad-spectrum antibiotic. She has been vomiting when trying to eat and is now on TNA per general surgery. [facility] were consulted to further investigate her failure to thrive.  Palliative medicine has also been contacted by the primary team.   Subjective: Still no appetite.  ROS: no complaints of nausea, vomiting, constipation diarrhea, cough, dyspnea or dysuria. No other complaints.   Assessment & Plan:   Failure to thrive - regurgitation - unable to tolerate orals - has regurgitation and vomiting when she tries to swallow -multifactorial, possibly 2/2 intrabdominal infection, malignancy, debility of severe chronic illness -head CT no acute intracranial abnormalities - have talked her into taking sips of liquids today. She was able to keep down the orange juice and ice cream - follow - [name] is severe and may be partly what is causing her to regurgitate- will increase Diflucan to 20 mg daily and change  Nystatin to Mycelex  Abdominal abscess - per primary team and IR  Thrush - see above   DM type II with hypoglycemia-Hypoglycemia has resolved -A1C 7.1 (11/28/17)  -on sensitive SSI  - Has been hyperglycemic since yesterday 01/20/18 -continue close monitoring on TPN and oral supplement  AKI, resolved -Cr increased to 1.35 on 01/18/2018- improved with hydration -Continue gentle hydration at 40cc/hr.    Suspected dysphagia - 01/21/18: per speech therapist suspect primary  esophageal dysphagia or other GI component, to consider esophagram and or GI consult - esophagram performed on 2/5 is found to be normal   HTN   -BP stable -IV Hydralazine prn    Hypernatremia and hypokalemia, resolved-  -Na+ 140 from 144 from 150 -K+ 3.8 from 3.5 from 3.0 -Sodium is 142 today, 01/22/2018.  And potassium is 4.  Serum creatinine is 0.65.  Glaucoma - -continue home drops, Xalatan and Trusopt  Hypomagnesemia -replaced  Ambulatory dysfunction -01/21/18: OT recommends SNF  Antimicrobials:  Anti-infectives (From admission, onward)   Start     Dose/Rate Route Frequency Ordered Stop   01/24/18 1200  fluconazole (DIFLUCAN) IVPB 200 mg     200 mg 100 mL/hr over 60 Minutes Intravenous Every 24 hours 01/24/18 0907 02/06/18 1159   01/24/18 0000  fluconazole (DIFLUCAN) 200-0.9 MG/100ML-% IVPB     200 mg 100 mL/hr over 60 Minutes Intravenous Every 24 hours 01/24/18 1235     01/23/18 1200  fluconazole (DIFLUCAN) IVPB 100 mg  Status:  Discontinued     100 mg 50 mL/hr over 60 Minutes Intravenous Every 24 hours 01/22/18 1551 01/24/18 0907   01/22/18 1700  fluconazole (DIFLUCAN) IVPB 200 mg     200 mg 100 mL/hr over 60 Minutes Intravenous  Once 01/22/18 1549 01/22/18 1817   12/31/17 1300  piperacillin-tazobactam (ZOSYN) IVPB 3.375 g  Status:  Discontinued     3.375 g 12.5 mL/hr over 240 Minutes Intravenous Every 8 hours 12/31/17 1252 01/18/18 1507       Objective: Vitals:   01/23/18 2102 01/24/18 0533 01/24/18 0800 01/24/18 1200  BP: 129/64 (!) 142/68 (!) 167/81 (!) 146/90  Pulse: Marland Kitchen)  101 99 (!) 106 (!) 105  Resp: 19 20 18 20   Temp: 98.6 F (37 C) 98.6 F (37 C) 98.3 F (36.8 C) 98.8 F (37.1 C)  TempSrc: Oral Oral Oral Oral  SpO2: 100% 98% 100% 100%  Weight:  54.2 kg (119 lb 7.8 oz)    Height:        Intake/Output Summary (Last 24 hours) at 01/24/2018 1343 Last data filed at 01/24/2018 7829 Gross per 24 hour  Intake 3096.33 ml  Output 5 ml  Net 3091.33 ml     Filed Weights   12/31/17 1148 01/14/18 0537 01/24/18 0533  Weight: 63 kg (138 lb 14.2 oz) 54 kg (119 lb 0.8 oz) 54.2 kg (119 lb 7.8 oz)    Examination: General exam: Appears comfortable  HEENT: PERRLA, oral mucosa moist, + thrush Respiratory system: Clear to auscultation. Respiratory effort normal. Cardiovascular system: S1 & S2 heard, RRR.  No murmurs  Gastrointestinal system: Abdomen soft, non-tender, nondistended. Normal bowel sound. No organomegaly Central nervous system: Alert and oriented. No focal neurological deficits. Extremities: No cyanosis, clubbing or edema Skin: No rashes or ulcers Psychiatry:  Mood & affect appropriate.     Data Reviewed: I have personally reviewed following labs and imaging studies  CBC: Recent Labs  Lab 01/20/18 1250 01/21/18 0409 01/24/18 0327  WBC 7.4 7.0 5.7  NEUTROABS  --  4.5  --   HGB 8.6* 8.7* 8.5*  HCT 28.2* 28.8* 27.8*  MCV 89.5 90.3 89.4  PLT 690* 704* 592*   Basic Metabolic Panel: Recent Labs  Lab 01/18/18 0429 01/19/18 0432 01/21/18 0409 01/22/18 0532 01/23/18 0422 01/24/18 0327  NA 144 143 140 142 139 138  K 3.5 3.7 3.8 4.0 4.1 3.9  CL 110 110 108 106 105 105  CO2 25 26 24 25 24 24   GLUCOSE 151* 246* 184* 155* 109* 101*  BUN 25* 24* 19 21* 22* 19  CREATININE 1.35* 0.91 0.62 0.65 0.67 0.70  CALCIUM 8.1* 8.3* 8.5* 8.6* 8.3* 8.5*  MG 1.9  --  1.7 2.0 2.0 1.7  PHOS 2.8  --  2.7 3.2  --  3.0   GFR: Estimated Creatinine Clearance: 44.8 mL/min (by C-G formula based on SCr of 0.7 mg/dL). Liver Function Tests: Recent Labs  Lab 01/21/18 0409 01/24/18 0327  AST 34 27  ALT 34 24  ALKPHOS 156* 159*  BILITOT 0.4 0.4  PROT 5.7* 5.5*  ALBUMIN 1.9* 1.9*   No results for input(s): LIPASE, AMYLASE in the last 168 hours. No results for input(s): AMMONIA in the last 168 hours. Coagulation Profile: No results for input(s): INR, PROTIME in the last 168 hours. Cardiac Enzymes: No results for input(s): CKTOTAL, CKMB,  CKMBINDEX, TROPONINI in the last 168 hours. BNP (last 3 results) No results for input(s): PROBNP in the last 8760 hours. HbA1C: No results for input(s): HGBA1C in the last 72 hours. CBG: Recent Labs  Lab 01/23/18 1622 01/23/18 1859 01/24/18 0044 01/24/18 0536 01/24/18 1137  GLUCAP 161* 165* 178* 113* 249*   Lipid Profile: No results for input(s): CHOL, HDL, LDLCALC, TRIG, CHOLHDL, LDLDIRECT in the last 72 hours. Thyroid Function Tests: No results for input(s): TSH, T4TOTAL, FREET4, T3FREE, THYROIDAB in the last 72 hours. Anemia Panel: No results for input(s): VITAMINB12, FOLATE, FERRITIN, TIBC, IRON, RETICCTPCT in the last 72 hours. Urine analysis:    Component Value Date/Time   COLORURINE RED (A) 01/14/2018 1500   APPEARANCEUR TURBID (A) 01/14/2018 1500   LABSPEC NOT CALCULATED 01/14/2018 1500  PHURINE NOT CALCULATED 01/14/2018 1500   GLUCOSEU (A) 01/14/2018 1500    TEST NOT REPORTED DUE TO COLOR INTERFERENCE OF URINE PIGMENT   HGBUR (A) 01/14/2018 1500    TEST NOT REPORTED DUE TO COLOR INTERFERENCE OF URINE PIGMENT   BILIRUBINUR (A) 01/14/2018 1500    TEST NOT REPORTED DUE TO COLOR INTERFERENCE OF URINE PIGMENT   KETONESUR (A) 01/14/2018 1500    TEST NOT REPORTED DUE TO COLOR INTERFERENCE OF URINE PIGMENT   PROTEINUR (A) 01/14/2018 1500    TEST NOT REPORTED DUE TO COLOR INTERFERENCE OF URINE PIGMENT   NITRITE (A) 01/14/2018 1500    TEST NOT REPORTED DUE TO COLOR INTERFERENCE OF URINE PIGMENT   LEUKOCYTESUR (A) 01/14/2018 1500    TEST NOT REPORTED DUE TO COLOR INTERFERENCE OF URINE PIGMENT   Sepsis Labs: @LABRCNTIP (procalcitonin:4,lacticidven:4) )No results found for this or any previous visit (from the past 240 hour(s)).       Radiology Studies: No results found.    Scheduled Meds: . acetaminophen (TYLENOL) oral liquid 160 mg/5 mL  500 mg Oral BID  . aspirin  81 mg Oral Daily  . chlorhexidine  15 mL Mouth Rinse BID  . clotrimazole  10 mg Oral 5 X Daily    . collagenase  1 application Topical Daily  . dorzolamide  1 drop Both Eyes BID  . enoxaparin (LOVENOX) injection  50 mg Subcutaneous Q12H  . escitalopram  5 mg Oral QHS  . feeding supplement  1 Container Oral BID BM  . feeding supplement (ENSURE ENLIVE)  237 mL Oral BID BM  . fluticasone  2 spray Each Nare Daily  . insulin aspart  0-15 Units Subcutaneous Q6H  . latanoprost  1 drop Both Eyes QHS  . magic mouthwash  15 mL Oral TID  . mouth rinse  15 mL Mouth Rinse q12n4p  . mirtazapine  15 mg Oral QHS  . ondansetron  4 mg Oral Q12H  . pantoprazole (PROTONIX) IV  40 mg Intravenous QHS  . sodium chloride flush  5 mL Intravenous Q8H   Continuous Infusions: . sodium chloride    . sodium chloride    . sodium chloride 40 mL/hr at 01/24/18 1338  . fluconazole (DIFLUCAN) IV    . lactated ringers 10 mL/hr at 01/14/18 2117  . TPN ADULT (ION) 65 mL/hr at 01/23/18 1808  . TPN ADULT (ION)       LOS: 31 days    Time spent in minutes: 35    Calvert Cantor, MD [facility] Pager: [email or web address] Password [email or web address] 01/24/2018, 1:43 PM

## 2018-01-24 NOTE — Progress Notes (Signed)
Daily Progress Note   Patient Name: Theresa Howe       Date: 01/24/2018 DOB: 1931/07/04  Age: 84 y.o. MRN#: [id number] Attending Physician: Almond Lint, MD Primary Care Physician: [name], MD Admit Date: 12/24/2017  Reason for Consultation/Follow-up: Establishing goals of care and Non pain symptom management  Subjective: Noted plan for patient to d/c to LTAC. She states she was able to eat better today- enjoys ice cream, denies mouth or throat pain. She shows me her pressure wound. Encouraged her to be out of bed as much as possible to decrease worsening of sacral wound. Noted she was still having gagging this morning. She does acknowledge some periods of flushing at times as well (likely related to carcinoid tumors).   Review of Systems  Constitutional: Positive for malaise/fatigue and weight loss.  Respiratory: Negative.   Cardiovascular: Negative.   Musculoskeletal: Negative.   Neurological: Positive for weakness.  Psychiatric/Behavioral: Positive for depression.    Length of Stay: 31  Current Medications: Scheduled Meds:  . acetaminophen (TYLENOL) oral liquid 160 mg/5 mL  500 mg Oral BID  . aspirin  81 mg Oral Daily  . chlorhexidine  15 mL Mouth Rinse BID  . clotrimazole  10 mg Oral 5 X Daily  . collagenase  1 application Topical Daily  . dorzolamide  1 drop Both Eyes BID  . enoxaparin (LOVENOX) injection  50 mg Subcutaneous Q12H  . escitalopram  5 mg Oral QHS  . feeding supplement  1 Container Oral BID BM  . feeding supplement (ENSURE ENLIVE)  237 mL Oral BID BM  . fluticasone  2 spray Each Nare Daily  . insulin aspart  0-15 Units Subcutaneous Q6H  . latanoprost  1 drop Both Eyes QHS  . magic mouthwash  15 mL Oral TID  . mouth rinse  15 mL Mouth Rinse q12n4p    . mirtazapine  15 mg Oral QHS  . ondansetron  4 mg Oral Q12H  . pantoprazole (PROTONIX) IV  40 mg Intravenous QHS  . sodium chloride flush  5 mL Intravenous Q8H    Continuous Infusions: . sodium chloride    . sodium chloride    . sodium chloride 40 mL/hr at 01/23/18 1234  . fluconazole (DIFLUCAN) IV    . lactated ringers 10 mL/hr at 01/14/18 2117  .  TPN ADULT (ION) 65 mL/hr at 01/23/18 1808  . TPN ADULT (ION)      PRN Meds: acetaminophen **OR** acetaminophen, albuterol, diphenhydrAMINE **OR** diphenhydrAMINE, hydrALAZINE, morphine injection, ondansetron **OR** ondansetron (ZOFRAN) IV, prochlorperazine **OR** prochlorperazine, sodium chloride flush, sodium chloride flush, sodium chloride flush  Physical Exam  Constitutional: She is oriented to person, place, and time.  Frail, cachetic  HENT:  White coating on tongue  Cardiovascular: Normal rate and regular rhythm.  Pulmonary/Chest: Effort normal and breath sounds normal.  Neurological: She is alert and oriented to person, place, and time.  Skin:  Foam dressing to sacrum  Psychiatric: She has a normal mood and affect. Her behavior is normal.  Nursing note and vitals reviewed.           Vital Signs: BP (!) 146/90 (BP Location: Right Arm)   Pulse (!) 105   Temp 98.8 F (37.1 C) (Oral)   Resp 20   Ht 5\' 5"  (1.651 m) Comment: 12/04/17  Wt 54.2 kg (119 lb 7.8 oz)   SpO2 100%   BMI 19.88 kg/m  SpO2: SpO2: 100 % O2 Device: O2 Device: Not Delivered O2 Flow Rate: O2 Flow Rate (L/min): 2 L/min  Intake/output summary:   Intake/Output Summary (Last 24 hours) at 01/24/2018 1301 Last data filed at 01/24/2018 4782 Gross per 24 hour  Intake 3096.33 ml  Output 5 ml  Net 3091.33 ml   LBM: Last BM Date: 01/23/18 Baseline Weight: Weight: 57.6 kg (127 lb)(12/10/17) Most recent weight: Weight: 54.2 kg (119 lb 7.8 oz)       Palliative Assessment/Data: PPS: 20%   Flowsheet Rows     Most Recent Value  Intake Tab  Referral  Department  Surgery  Unit at Time of Referral  Med/Surg Unit  Palliative Care Primary Diagnosis  Other (Comment)  Palliative Care Type  New Palliative care  Reason for referral  Clarify Goals of Care  Date first seen by Palliative Care  01/20/18  Clinical Assessment  Palliative Performance Scale Score  50%  Psychosocial & Spiritual Assessment  Palliative Care Outcomes  Patient/Family meeting held?  Yes  Who was at the meeting?  patient and niece Vikki Ports)  Palliative Care Outcomes  Clarified goals of care, Improved pain interventions, Improved non-pain symptom therapy, Provided psychosocial or spiritual support, ACP counseling assistance      Patient Active Problem List   Diagnosis Date Noted  . On total parenteral nutrition (TPN)   . Dysphagia   . Intra-abdominal abscess (HCC)   . Oral thrush   . Palliative care by specialist   . Goals of care, counseling/discussion   . Carcinomatosis (HCC) 01/17/2018  . Failure to thrive (0-17) 12/24/2017  . Protein-calorie malnutrition, severe 12/06/2017  . Small bowel mass 12/04/2017  . Protein-calorie malnutrition (HCC) 09/11/2017  . Weight loss 09/11/2017  . Diabetic neuropathy (HCC) 09/28/2015  . Hip pain 09/21/2014  . Muscle ache of extremity 06/12/2014  . Anxiety state 10/06/2013  . Bladder prolapse, female, acquired 08/03/2013  . Osteoporosis, unspecified 08/03/2013  . Nonspecific (abnormal) findings on radiological and other examination of biliary tract 04/17/2013  . Diabetes with neurologic complications (HCC) 09/18/2011  . HTN (hypertension) 09/18/2011  . Hyperlipidemia LDL goal <70 09/18/2011  . Glaucoma 09/18/2011    Palliative Care Assessment & Plan   Patient Profile: 84 y.o. female  with past medical history of HTN, HLD, DM, neuropathy, osteoporosis, depression, glaucoma admitted on 12/24/2017 with nausea, vomiting, and abdominal pain. Recent hospitalization for small bowel obstruction s/p  exploratory lap on 12/04/17.  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 Patient was discharged to SNF for rehab. On admission, repeat CT revealed partial obstruction requiring another exploratory lap on 12/27/17. 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 Throughout course of hospitalization, patient developed RLQ abscess s/p JP drain placement on 01/09/18. Received course of antibiotics. She has been on TNA and continues to have poor nutritional intake and dysphagia. Esophagram normal on 2/5. Palliative medicine consultation for goals of care.   Assessment/Recommendations/Plan   Will add 3 doses of scheduled ondansetron 4mg  ODT for gagging as 5HT3 agonists aid in management of symptomatic neuroendocrine tumors  Will start sandostatin 50mg  sq BID symptomatic neuroendocrine tumor- hopefully, this will increase her po intake- discussed this with Veronia Beets and he agrees  Add magic mouthwash 15mL QID swish and swallow to more aggressively treat thrush  Hopeful for discharge to LT[facility]- [facility]  Goals of Care and Additional Recommendations:  Limitations on Scope of Treatment: Full Scope Treatment  Code Status:  Full code  Prognosis:   Unable to determine  Discharge Planning:  LTAC  Care plan was discussed with patien'ts nephew Veronia Beets.  Thank you for allowing the Palliative Medicine Team to assist in the care of this patient.   Time In: 1225 Time Out: 1300 Total Time 35 mins Prolonged Time Billed No      Greater than 50%  of this time was spent counseling and coordinating care related to the above assessment and plan.  Ocie Bob, AGNP-C Palliative Medicine   Please contact Palliative Medicine Team phone at [phone] for questions and concerns.

## 2018-01-24 NOTE — Discharge Summary (Addendum)
Physician Discharge Summary  Patient ID: Theresa Howe MRN: [id number] DOB/AGE: Apr 17, 1931 84 y.o.  Admit date: 12/24/2017 Discharge date: 01/25/2018  Admission Diagnoses: Patient Active Problem List   Diagnosis Date Noted  . Advance care planning   . On total parenteral nutrition (TPN)   . Dysphagia   . Intra-abdominal abscess (HCC)   . Oral thrush   . Palliative care by specialist   . Goals of care, counseling/discussion   . Carcinomatosis (HCC) 01/17/2018  . Failure to thrive (0-17) 12/24/2017  . Protein-calorie malnutrition, severe 12/06/2017  . Small bowel mass 12/04/2017  . Protein-calorie malnutrition (HCC) 09/11/2017  . Weight loss 09/11/2017  . Diabetic neuropathy (HCC) 09/28/2015  . Hip pain 09/21/2014  . Muscle ache of extremity 06/12/2014  . Anxiety state 10/06/2013  . Bladder prolapse, female, acquired 08/03/2013  . Osteoporosis, unspecified 08/03/2013  . Nonspecific (abnormal) findings on radiological and other examination of biliary tract 04/17/2013  . Diabetes with neurologic complications (HCC) 09/18/2011  . HTN (hypertension) 09/18/2011  . Hyperlipidemia LDL goal <70 09/18/2011  . Glaucoma 09/18/2011    Discharge Diagnoses:  Principal Problem:   Failure to thrive (0-17) Active Problems:   Diabetes with neurologic complications (HCC)   HTN (hypertension)   Glaucoma   Protein-calorie malnutrition, severe   Carcinomatosis (HCC)   On total parenteral nutrition (TPN)   Dysphagia   Intra-abdominal abscess (HCC)   Oral thrush   Palliative care by specialist   Goals of care, counseling/discussion   Advance care planning Chronic blood loss anemia with acute blood loss anemia DVT Stage IV small bowel neuroendocrine cancer  Discharged Condition: stable  Hospital Course:  Pt was admitted from clinic 12/24/17 (had been discharged to SNF) with failure to thrive, dehydration, and nausea/vomiting.  A CT was performed showing an large tubular structure that was  unclear if it was a dilated small bowel loop or extraluminal collection.  She was stable, so TNA was instituted.  She was taken back to the OR on 12/27/2017 and found to have a volvulized anastamosis that was very dilated 9 x20 cm.  This was resected along with a bit more small bowel that was banged up.  She was kept on TNA after surgery.  She was also sent to the ICU post op for observation.  Her skin was left open due to fecal contamination.  She was left NPO for around 2 weeks in order to allow tenuous anastamosis to heal.  Around that time, she was having leukocytosis, continued ileus, and diffuse abdominal pain.  A repeat CT showed a fluid collection.  IR drained this and it grew multiple bacteria, with none predominant.  She was kept on IV antibiotics for 2 weeks.  She had numerous issues with sundowning.    Also, once we let her eat, she continued to have throwing up of pretty much everything she ate.  She also was found to have a DVT in her right common femoral vein.  Lovenox was started.  However, at that point, she started having bleeding. It was originally assumed to be from the GI tract, but was found to be in her urine.  Lovenox was held and she was transfused.  Once this was more stable, lovenox was restarted at prophylactic doses and then increased to therapeutic doses.    She does have carcinomatosis from her neuroendocrine tumor, but it was found to be low grade.  She continued to have failure to thrive and intermittent significant decrease in mental status.  Medicine and palliative care were consulted.  She was found to have more issues with upper GI tract gagging/emesis than stomach/nausea issues.  Esophagram was normal.  She was started on fluconazole for thrush.  Sinogram was performed through the perc drain and did not show communication with GI tract, but did still show a large cavity.  Her drain was left in place at this point.  It will need to be re interrogated next week probably with CT  scan.    Due to her sundowning, she pulled out picc and NGT.  A tunneled catheter was placed.  She started to pick up her energy level and get out of bed a bit more.  She does seem to be more oriented during the day.  Due to her continued need of TNA and intermittent mental status issues, she is not a good candidate for SNF.  She cannot go home at this point due to no 24 hour care available at home.  [facility]  Consults: medicine, palliative care, IR  Significant Diagnostic Studies: labs: prior to d/c WBCs 5.7, HCT 27.8, Plts 592, Normal mag, phos.  Alb 1.9, Prealbumin 30, Alk phos sl elevated at 159.    Treatments: antibiotics: vancomycin, Zosyn and fluconazole, insulin: in TNA, TPN and surgery: see above.    Discharge Exam: Blood pressure 138/86, pulse 99, temperature 98.4 F (36.9 C), temperature source Oral, resp. rate 16, height 5\' 5"  (1.651 m), weight 54.2 kg (119 lb 7.8 oz), SpO2 97 %. General appearance: alert, cooperative and no distress Resp: breathing comfortably GI: soft, non distended, non tender.  drain serosang Extremities: extremities normal, atraumatic, no cyanosis or edema  Disposition: 03-Skilled Nursing Facility  Discharge Instructions    Call MD for:  difficulty breathing, headache or visual disturbances   Complete by:  As directed    Call MD for:  persistant nausea and vomiting   Complete by:  As directed    Call MD for:  redness, tenderness, or signs of infection (pain, swelling, redness, odor or green/yellow discharge around incision site)   Complete by:  As directed    Call MD for:  severe uncontrolled pain   Complete by:  As directed    Call MD for:  temperature >100.4   Complete by:  As directed    Change dressing (specify)   Complete by:  As directed    Wet to dry daily   Diet - low sodium heart healthy   Complete by:  As directed    Increase activity slowly   Complete by:  As directed      Allergies as of 01/25/2018   No Known  Allergies     Medication List    STOP taking these medications   docusate sodium 100 MG capsule Commonly known as:  COLACE   metFORMIN 1000 MG tablet Commonly known as:  GLUCOPHAGE   omeprazole 40 MG capsule Commonly known as:  PRILOSEC   ondansetron 4 MG tablet Commonly known as:  ZOFRAN   polyethylene glycol packet Commonly known as:  MIRALAX / GLYCOLAX     TAKE these medications   acetaminophen 325 MG tablet Commonly known as:  TYLENOL Take 2 tablets (650 mg total) by mouth every 6 (six) hours as needed for mild pain (or Fever >/= 101).   albuterol 108 (90 Base) MCG/ACT inhaler Commonly known as:  PROAIR HFA Inhale 2 puffs into the lungs every 6 (six) hours as needed for wheezing or shortness  of breath.   alendronate 70 MG tablet Commonly known as:  FOSAMAX TAKE 1 TABLET EVERY 7 DAYS WITH A FULL GLASS OF WATER ON AN EMPTY STOMACH   aspirin 81 MG tablet Take 81 mg by mouth daily.   CALCIUM 600 + D PO Take 1 tablet by mouth daily.   CENTRUM SILVER PO Take 1 tablet by mouth daily.   chlorhexidine 0.12 % solution Commonly known as:  PERIDEX 15 mLs by Mouth Rinse route 2 (two) times daily.   clotrimazole 10 MG troche Commonly known as:  MYCELEX Take 1 tablet (10 mg total) by mouth 5 (five) times daily.   collagenase ointment Commonly known as:  SANTYL Apply 1 application topically daily. Left buttock   dorzolamide 2 % ophthalmic solution Commonly known as:  TRUSOPT Place 1 drop into both eyes 2 (two) times daily.   enoxaparin 60 MG/0.6ML injection Commonly known as:  LOVENOX Inject 0.5 mLs (50 mg total) into the skin every 12 (twelve) hours.   escitalopram 10 MG tablet Commonly known as:  LEXAPRO TAKE 1 TABLET AT BEDTIME What changed:    how much to take  how to take this  when to take this   feeding supplement (ENSURE ENLIVE) Liqd Take 237 mLs by mouth 3 (three) times daily between meals.   fluconazole 200-0.9 MG/100ML-% IVPB Commonly  known as:  DIFLUCAN Inject 100 mLs (200 mg total) into the vein daily.   fluticasone 50 MCG/ACT nasal spray Commonly known as:  FLONASE USE 2 SPRAYS IN EACH       NOSTRIL DAILY   gabapentin 100 MG capsule Commonly known as:  NEURONTIN TAKE 1 TO 2 CAPSULES AT    BEDTIME FOR NEUROPATHY What changed:    how much to take  how to take this  when to take this  additional instructions   latanoprost 0.005 % ophthalmic solution Commonly known as:  XALATAN Place 1 drop into both eyes at bedtime.   losartan-hydrochlorothiazide 50-12.5 MG tablet Commonly known as:  HYZAAR Take 1 tablet daily by mouth.   magic mouthwash Soln Take 15 mLs by mouth 3 (three) times daily.   mirtazapine 15 MG tablet Commonly known as:  REMERON Take 0.5 tablets (7.5 mg total) by mouth at bedtime. For appetite   octreotide 50 MCG/ML Soln injection Commonly known as:  SANDOSTATIN Inject 1 mL (50 mcg total) into the skin every 12 (twelve) hours.   ondansetron 4 MG disintegrating tablet Commonly known as:  ZOFRAN-ODT Take 1 tablet (4 mg total) by mouth every 6 (six) hours as needed for nausea.   ONE TOUCH ULTRA TEST test strip Generic drug:  glucose blood TEST FASTING BLOOD SUGAR ONCE DAILY **DX E11.4   oxyCODONE 5 MG/5ML solution Commonly known as:  ROXICODONE Take 5 mLs (5 mg total) by mouth every 6 (six) hours. What changed:    when to take this  reasons to take this   pantoprazole 40 MG injection Commonly known as:  PROTONIX Inject 40 mg into the vein at bedtime.            Discharge Care Instructions  (From admission, onward)        Start     Ordered   01/24/18 0000  Change dressing (specify)    Comments:  Wet to dry daily   01/24/18 1235     Follow-up Information    Almond Lint, MD Follow up in 2 week(s).   Specialty:  General Surgery Contact information: [address]  [address] [place] [address] [phone]           Signed: Almond Lint 01/25/2018,  8:53 AM

## 2018-01-24 NOTE — Social Work (Signed)
CSW aware pt has been accepted to discharge to [facility].   CSW signing off. Please consult if any additional needs arise.  [place], [place] Work [phone]

## 2018-01-24 NOTE — Care Management Note (Signed)
Case Management Note  Patient Details  Name: Theresa Howe MRN: [id number] Date of Birth: 30-May-1931  Subjective/Objective:                    Action/Plan:  Suggested in CC meeting LTAC , Dr Barry Dienes aware and in agreement . [facility] and [facility] will both review chart and let NCM know if she qualifies .  Expected Discharge Date:                  Expected Discharge Plan:  Long Term Acute Care (LTAC)  In-House Referral:  Clinical Social Work  Discharge planning Services  CM Consult  Post Acute Care Choice:  Long Term Acute Care (LTAC) Choice offered to:     DME Arranged:    DME Agency:     HH Arranged:    [place] Agency:     Status of Service:  In process, will continue to follow  If discussed at Long Length of Stay Meetings, dates discussed:  01-24-18     Additional Comments:  Marilu Favre, RN 01/24/2018, 10:12 AM

## 2018-01-25 DIAGNOSIS — H409 Unspecified glaucoma: Secondary | ICD-10-CM | POA: Diagnosis not present

## 2018-01-25 DIAGNOSIS — Z681 Body mass index (BMI) 19 or less, adult: Secondary | ICD-10-CM | POA: Diagnosis not present

## 2018-01-25 DIAGNOSIS — R2681 Unsteadiness on feet: Secondary | ICD-10-CM | POA: Diagnosis not present

## 2018-01-25 DIAGNOSIS — B370 Candidal stomatitis: Secondary | ICD-10-CM | POA: Diagnosis present

## 2018-01-25 DIAGNOSIS — K651 Peritoneal abscess: Secondary | ICD-10-CM | POA: Diagnosis not present

## 2018-01-25 DIAGNOSIS — R488 Other symbolic dysfunctions: Secondary | ICD-10-CM | POA: Diagnosis not present

## 2018-01-25 DIAGNOSIS — L89153 Pressure ulcer of sacral region, stage 3: Secondary | ICD-10-CM | POA: Diagnosis not present

## 2018-01-25 DIAGNOSIS — I10 Essential (primary) hypertension: Secondary | ICD-10-CM | POA: Diagnosis not present

## 2018-01-25 DIAGNOSIS — L89152 Pressure ulcer of sacral region, stage 2: Secondary | ICD-10-CM | POA: Diagnosis present

## 2018-01-25 DIAGNOSIS — M810 Age-related osteoporosis without current pathological fracture: Secondary | ICD-10-CM | POA: Diagnosis not present

## 2018-01-25 DIAGNOSIS — R4182 Altered mental status, unspecified: Secondary | ICD-10-CM | POA: Diagnosis not present

## 2018-01-25 DIAGNOSIS — K56609 Unspecified intestinal obstruction, unspecified as to partial versus complete obstruction: Secondary | ICD-10-CM | POA: Diagnosis not present

## 2018-01-25 DIAGNOSIS — R531 Weakness: Secondary | ICD-10-CM | POA: Diagnosis not present

## 2018-01-25 DIAGNOSIS — E43 Unspecified severe protein-calorie malnutrition: Secondary | ICD-10-CM | POA: Diagnosis not present

## 2018-01-25 DIAGNOSIS — D649 Anemia, unspecified: Secondary | ICD-10-CM | POA: Diagnosis not present

## 2018-01-25 DIAGNOSIS — E7849 Other hyperlipidemia: Secondary | ICD-10-CM | POA: Diagnosis not present

## 2018-01-25 DIAGNOSIS — R109 Unspecified abdominal pain: Secondary | ICD-10-CM | POA: Diagnosis not present

## 2018-01-25 DIAGNOSIS — E119 Type 2 diabetes mellitus without complications: Secondary | ICD-10-CM | POA: Diagnosis not present

## 2018-01-25 DIAGNOSIS — Z9049 Acquired absence of other specified parts of digestive tract: Secondary | ICD-10-CM | POA: Diagnosis not present

## 2018-01-25 DIAGNOSIS — F329 Major depressive disorder, single episode, unspecified: Secondary | ICD-10-CM | POA: Diagnosis not present

## 2018-01-25 DIAGNOSIS — R627 Adult failure to thrive: Secondary | ICD-10-CM | POA: Diagnosis not present

## 2018-01-25 DIAGNOSIS — E1140 Type 2 diabetes mellitus with diabetic neuropathy, unspecified: Secondary | ICD-10-CM | POA: Diagnosis present

## 2018-01-25 DIAGNOSIS — E871 Hypo-osmolality and hyponatremia: Secondary | ICD-10-CM | POA: Diagnosis present

## 2018-01-25 DIAGNOSIS — Z931 Gastrostomy status: Secondary | ICD-10-CM | POA: Diagnosis not present

## 2018-01-25 DIAGNOSIS — M6281 Muscle weakness (generalized): Secondary | ICD-10-CM | POA: Diagnosis not present

## 2018-01-25 DIAGNOSIS — K5649 Other impaction of intestine: Secondary | ICD-10-CM | POA: Diagnosis not present

## 2018-01-25 DIAGNOSIS — L89159 Pressure ulcer of sacral region, unspecified stage: Secondary | ICD-10-CM | POA: Diagnosis not present

## 2018-01-25 DIAGNOSIS — F411 Generalized anxiety disorder: Secondary | ICD-10-CM | POA: Diagnosis not present

## 2018-01-25 DIAGNOSIS — I82411 Acute embolism and thrombosis of right femoral vein: Secondary | ICD-10-CM | POA: Diagnosis not present

## 2018-01-25 DIAGNOSIS — E1165 Type 2 diabetes mellitus with hyperglycemia: Secondary | ICD-10-CM | POA: Diagnosis not present

## 2018-01-25 DIAGNOSIS — R1310 Dysphagia, unspecified: Secondary | ICD-10-CM | POA: Diagnosis not present

## 2018-01-25 DIAGNOSIS — Z483 Aftercare following surgery for neoplasm: Secondary | ICD-10-CM | POA: Diagnosis not present

## 2018-01-25 DIAGNOSIS — C786 Secondary malignant neoplasm of retroperitoneum and peritoneum: Secondary | ICD-10-CM | POA: Diagnosis not present

## 2018-01-25 DIAGNOSIS — C481 Malignant neoplasm of specified parts of peritoneum: Secondary | ICD-10-CM | POA: Diagnosis present

## 2018-01-25 DIAGNOSIS — R1312 Dysphagia, oropharyngeal phase: Secondary | ICD-10-CM | POA: Diagnosis not present

## 2018-01-25 DIAGNOSIS — E876 Hypokalemia: Secondary | ICD-10-CM | POA: Diagnosis present

## 2018-01-25 DIAGNOSIS — N390 Urinary tract infection, site not specified: Secondary | ICD-10-CM | POA: Diagnosis not present

## 2018-01-25 DIAGNOSIS — C7A8 Other malignant neuroendocrine tumors: Secondary | ICD-10-CM | POA: Diagnosis not present

## 2018-01-25 DIAGNOSIS — F339 Major depressive disorder, recurrent, unspecified: Secondary | ICD-10-CM | POA: Diagnosis not present

## 2018-01-25 DIAGNOSIS — M1280 Other specific arthropathies, not elsewhere classified, unspecified site: Secondary | ICD-10-CM | POA: Diagnosis not present

## 2018-01-25 DIAGNOSIS — R112 Nausea with vomiting, unspecified: Secondary | ICD-10-CM | POA: Diagnosis not present

## 2018-01-25 LAB — GLUCOSE, CAPILLARY
Glucose-Capillary: 121 mg/dL — ABNORMAL HIGH (ref 65–99)
Glucose-Capillary: 166 mg/dL — ABNORMAL HIGH (ref 65–99)
Glucose-Capillary: 66 mg/dL (ref 65–99)
Glucose-Capillary: 73 mg/dL (ref 65–99)

## 2018-01-25 MED ORDER — OCTREOTIDE ACETATE 50 MCG/ML IJ SOLN: 50.0000 ug | Freq: Two times a day (BID) | INTRAMUSCULAR | 3 refills | Status: DC

## 2018-01-25 MED ORDER — HEPARIN SOD (PORK) LOCK FLUSH 100 UNIT/ML IV SOLN
250.0000 [IU] | INTRAVENOUS | Status: AC | PRN
  Administered 2018-01-25: 500 [IU]

## 2018-01-25 NOTE — Progress Notes (Signed)
Referring Physician(s): Dr. Barry Dienes  Supervising Physician: Corrie Mckusick  Patient Status:  [facility] - In-pt  Chief Complaint:  RLQ abscess drain placed in IR 1/23   Subjective: Pleasantly confused. Drain with decreased output- now 5 mL, serosanguinous.    Allergies: Patient has no known allergies.  Medications: Prior to Admission medications   Medication Sig Start Date End Date Taking? Authorizing Provider  acetaminophen (TYLENOL) 325 MG tablet Take 2 tablets (650 mg total) by mouth every 6 (six) hours as needed for mild pain (or Fever >/= 101). 12/13/17  Yes Earnstine Regal, PA-C  albuterol [facility] HFA) 108 (90 Base) MCG/ACT inhaler Inhale 2 puffs into the lungs every 6 (six) hours as needed for wheezing or shortness of breath. 12/23/15  Yes [place], Modena Nunnery, MD  aspirin 81 MG tablet Take 81 mg by mouth daily.     Yes [provider]  Calcium Carbonate-Vitamin D (CALCIUM 600 + D PO) Take 1 tablet by mouth daily.    Yes [provider]  collagenase (SANTYL) ointment Apply 1 application topically daily. Left buttock   Yes [provider]  docusate sodium (COLACE) 100 MG capsule Take 1 capsule (100 mg total) by mouth 2 (two) times daily. 12/13/17  Yes Earnstine Regal, PA-C  dorzolamide (TRUSOPT) 2 % ophthalmic solution Place 1 drop into both eyes 2 (two) times daily. 06/02/16  Yes [place], Modena Nunnery, MD  escitalopram (LEXAPRO) 10 MG tablet TAKE 1 TABLET AT BEDTIME Patient taking differently: TAKE 5mg  AT BEDTIME 12/03/17  Yes [place], Modena Nunnery, MD  feeding supplement, ENSURE ENLIVE, (ENSURE ENLIVE) LIQD Take 237 mLs by mouth 3 (three) times daily between meals. 12/13/17  Yes Earnstine Regal, PA-C  fluticasone [facility]) 50 MCG/ACT nasal spray USE 2 SPRAYS IN EACH       NOSTRIL DAILY 12/24/17  Yes [place], Modena Nunnery, MD  gabapentin (NEURONTIN) 100 MG capsule TAKE 1 TO 2 CAPSULES AT    BEDTIME FOR NEUROPATHY Patient taking differently: Take 100 mg by mouth at  bedtime.  12/03/17  Yes [place], Modena Nunnery, MD  latanoprost (XALATAN) 0.005 % ophthalmic solution Place 1 drop into both eyes at bedtime. 10/01/17  Yes [place], Modena Nunnery, MD  losartan-hydrochlorothiazide (HYZAAR) 50-12.5 MG tablet Take 1 tablet daily by mouth. 10/25/17  Yes  [place], Modena Nunnery, MD  metFORMIN (GLUCOPHAGE) 1000 MG tablet Take 0.5 tablets (500 mg total) by mouth 2 (two) times daily with a meal. 12/26/16  Yes [place], Modena Nunnery, MD  mirtazapine (REMERON) 15 MG tablet Take 0.5 tablets (7.5 mg total) by mouth at bedtime. For appetite 10/04/17  Yes [place], Modena Nunnery, MD  Multiple Vitamins-Minerals (CENTRUM SILVER PO) Take 1 tablet by mouth daily.    Yes [provider]  omeprazole (PRILOSEC) 40 MG capsule Take 40 mg by mouth daily.   Yes [provider]  ondansetron (ZOFRAN) 4 MG tablet Take 4 mg by mouth every 6 (six) hours as needed for nausea or vomiting.   Yes [provider]  oxyCODONE (ROXICODONE) 5 MG/5ML solution Take 5 mLs (5 mg total) by mouth every 6 (six) hours. Patient taking differently: Take 5 mg by mouth every 6 (six) hours as needed for severe pain.  12/13/17  Yes Earnstine Regal, PA-C  polyethylene glycol [facility] / GLYCOLAX) packet Take 17 g by mouth daily as needed for moderate constipation. 12/13/17  Yes Earnstine Regal, PA-C  alendronate (FOSAMAX) 70 MG tablet TAKE 1 TABLET EVERY 7 DAYS WITH A FULL GLASS OF WATER ON AN  EMPTY STOMACH 12/25/17   Alycia Rossetti, MD  ONE TOUCH ULTRA TEST test strip TEST FASTING BLOOD SUGAR ONCE DAILY **DX E11.4 09/10/17   Alycia Rossetti, MD     Vital Signs: BP 138/86 (BP Location: Left Arm)   Pulse 99   Temp 98.4 F (36.9 C) (Oral)   Resp 16   Ht 5\' 5"  (1.651 m) Comment: 12/04/17  Wt 119 lb 7.8 oz (54.2 kg)   SpO2 97%   BMI 19.88 kg/m   Physical Exam  Constitutional: She appears well-developed.  Cardiovascular: Normal rate.  Pulmonary/Chest: Effort normal. No respiratory distress.  Abdominal:  Soft. There is no tenderness.  Drain in place with small amount of serosanguinous output. Insertion site c/d/i.  Neurological: She is alert.  Skin: Skin is warm and dry.  Nursing note and vitals reviewed.   Imaging: Dg Esophagus W/water Sol Cm  Result Date: 01/22/2018 CLINICAL DATA:  Patient was holding chest and gagging during bedside speech study. EXAM: ESOPHOGRAM/BARIUM SWALLOW TECHNIQUE: Single contrast examination was performed using isotonic water-soluble contrast-as requested. FLUOROSCOPY TIME:  Fluoroscopy Time:  54 seconds Radiation Exposure Index (if provided by the fluoroscopic device): 1.3 mGy Number of Acquired Spot Images: 0 COMPARISON:  None. FINDINGS: Patient was studied semi recumbent LPO. The esophagus has normal distensibility and motility. No stasis or spasm was noted. There is no detectable mucosal irregularity or mass. No hiatal hernia. Aspiration was never seen. No obstructive process seen in the pharynx. IMPRESSION: Normal single contrast esophagram. Electronically Signed   By: Monte Fantasia M.D.   On: 01/22/2018 13:49    Labs:  CBC: Recent Labs    01/17/18 0647 01/20/18 1250 01/21/18 0409 01/24/18 0327  WBC 8.1 7.4 7.0 5.7  HGB 8.4* 8.6* 8.7* 8.5*  HCT 28.8* 28.2* 28.8* 27.8*  PLT 746* 690* 704* 592*    COAGS: Recent Labs    11/28/17 1220 12/24/17 1301 01/08/18 1452 01/17/18 0718  INR 1.08 1.25 1.19 1.25    BMP: Recent Labs    01/21/18 0409 01/22/18 0532 01/23/18 0422 01/24/18 0327  NA 140 142 139 138  K 3.8 4.0 4.1 3.9  CL 108 106 105 105  CO2 24 25 24 24   GLUCOSE 184* 155* 109* 101*  BUN 19 21* 22* 19  CALCIUM 8.5* 8.6* 8.3* 8.5*  CREATININE 0.62 0.65 0.67 0.70  GFRNONAA >60 >60 >60 >60  GFRAA >60 >60 >60 >60    LIVER FUNCTION TESTS: Recent Labs    01/16/18 0444 01/17/18 0647 01/21/18 0409 01/24/18 0327  BILITOT 0.6 0.7 0.4 0.4  AST 33 25 34 27  ALT 40 31 34 24  ALKPHOS 162* 134* 156* 159*  PROT 6.3* 6.0* 5.7* 5.5*    ALBUMIN 2.0* 1.9* 1.9* 1.9*    Assessment and Plan: RLQ abscess drain placed in IR 1/23 Output which was previously stable at 10-20 mL per day has now decreased to 5 mL sero-sanguinous output.  Discussed with Dr. Earleen Newport who recommends to remove drain.   Drain removed without complication.  Patient to d/c to [facility].   Electronically Signed: Docia Barrier, PA 01/25/2018, 11:14 AM   I spent a total of 15 Minutes at the the patient's bedside AND on the patient's hospital floor or unit, greater than 50% of which was counseling/coordinating care for abscess drain

## 2018-01-25 NOTE — Progress Notes (Addendum)
[facility] called, bed is avail. 1800 called report to [facility] but at this time they are waiting for the room to be cleaned and zapped. They will give 6N a call when the room is ready.  [place] a call to [facility] but no answer, voicemail full. Placed a call to his wife Nita Sells re pt being discharge to [facility].

## 2018-01-25 NOTE — Progress Notes (Signed)
Physical Therapy Treatment Patient Details Name: Theresa Howe MRN: [id number] DOB: 02-Sep-1931 Today's Date: 01/25/2018    History of Present Illness 84 yo with Stage IV neuroendocrine cancer of the small bowel, s/p resection 12/18 admitted with partial SBO s/p exp lap 1/10. PMHx: DM, glaucoma, HTN, neuropathy    PT Comments    Patient continues to be pleasantly confused, oriented to person, and agreeable to mobility. Pt tolerated short distance gait in room.Pt continues to decline attempting going out of room because she wants another "outfit" to put on before she does that. Pt required mod A for balance and max directional cues when attempting to navigate environment. Continue to progress as tolerated.    Follow Up Recommendations  SNF;Supervision/Assistance - 24 hour     Equipment Recommendations  3in1 (PT)    Recommendations for Other Services       Precautions / Restrictions Precautions Precautions: Fall Precaution Comments: Abdominial incision, sacral wound, and recently has been leaking copious amounts of blood from her vagina?  Use her pullups that are in the room.  Restrictions Weight Bearing Restrictions: No    Mobility  Bed Mobility Overal bed mobility: Needs Assistance Bed Mobility: Sidelying to Sit   Sidelying to sit: Supervision       General bed mobility comments: supervision for safety; use of rail  Transfers Overall transfer level: Needs assistance Equipment used: 1 person hand held assist Transfers: Sit to/from Stand Sit to Stand: Min assist;Mod assist         General transfer comment: min A to power up into standing and mod A upon standing for balance; pt tends to use bilat LE to balance against edge of seated surface  Ambulation/Gait Ambulation/Gait assistance: Mod assist Ambulation Distance (Feet): 25 Feet Assistive device: 1 person hand held assist Gait Pattern/deviations: Step-through pattern;Staggering left;Staggering  right;Shuffle;Scissoring;Trunk flexed;Narrow base of support Gait velocity: reduced   General Gait Details: pt required assist for balance; pt with several LOB during session   Stairs            Wheelchair Mobility    Modified Rankin (Stroke Patients Only)       Balance Overall balance assessment: Needs assistance Sitting-balance support: Feet supported;No upper extremity supported Sitting balance-Leahy Scale: Good     Standing balance support: Single extremity supported;Bilateral upper extremity supported Standing balance-Leahy Scale: Poor Standing balance comment: pt unable to static stand without assistance                             Cognition Arousal/Alertness: Awake/alert Behavior During Therapy: WFL for tasks assessed/performed Overall Cognitive Status: Impaired/Different from baseline Area of Impairment: Orientation;Attention;Memory;Following commands;Safety/judgement;Awareness;Problem solving                 Orientation Level: Disoriented to;Time;Situation;Place Current Attention Level: [facility] Memory: Decreased short-term memory Following Commands: Follows one step commands inconsistently;Follows one step commands with increased time Safety/Judgement: Decreased awareness of deficits;Decreased awareness of safety Awareness: Intellectual Problem Solving: Slow processing;Difficulty sequencing;Requires verbal cues        Exercises      General Comments        Pertinent Vitals/Pain Pain Assessment: Faces Faces Pain Scale: Hurts a little bit Pain Location: sacrum  Pain Descriptors / Indicators: Discomfort;Sore Pain Intervention(s): Monitored during session;Repositioned    Home Living                      Prior Function  PT Goals (current goals can now be found in the care plan section) Acute Rehab PT Goals PT Goal Formulation: With patient Time For Goal Achievement: 01/28/18 Potential to Achieve Goals:  Good Progress towards PT goals: Progressing toward goals    Frequency    Min 2X/week      PT Plan Current plan remains appropriate    Co-evaluation              AM-PAC PT "6 Clicks" Daily Activity  Outcome Measure  Difficulty turning over in bed (including adjusting bedclothes, sheets and blankets)?: A Little Difficulty moving from lying on back to sitting on the side of the bed? : A Lot Difficulty sitting down on and standing up from a chair with arms (e.g., wheelchair, bedside commode, etc,.)?: Unable Help needed moving to and from a bed to chair (including a wheelchair)?: A Little Help needed walking in hospital room?: A Lot Help needed climbing 3-5 steps with a railing? : A Lot 6 Click Score: 13    End of Session Equipment Utilized During Treatment: Gait belt Activity Tolerance: Patient tolerated treatment well Patient left: with call bell/phone within reach;in chair;with chair alarm set Nurse Communication: Mobility status PT Visit Diagnosis: Other abnormalities of gait and mobility (R26.89);Muscle weakness (generalized) (M62.81) Pain - part of body: (abdomen)     Time: 1027-2536 PT Time Calculation (min) (ACUTE ONLY): 24 min  Charges:  $Gait Training: 8-22 mins $Therapeutic Activity: 8-22 mins                    G Codes:       Erline Levine, PTA Pager: [phone]     Carolynne Edouard 01/25/2018, 3:42 PM

## 2018-01-26 ENCOUNTER — Inpatient Hospital Stay
Admission: RE | Admit: 2018-01-26 | Discharge: 2018-02-15 | Disposition: A | Discharge status: Skilled Nursing Facility | Payer: Medicare Other | Source: Ambulatory Visit | Attending: Internal Medicine | Admitting: Internal Medicine

## 2018-01-26 ENCOUNTER — Other Ambulatory Visit (HOSPITAL_COMMUNITY): Payer: Medicare Other

## 2018-01-26 DIAGNOSIS — R109 Unspecified abdominal pain: Secondary | ICD-10-CM | POA: Diagnosis not present

## 2018-01-26 LAB — BASIC METABOLIC PANEL
ANION GAP: 11 (ref 5–15)
BUN: 17 mg/dL (ref 6–20)
CALCIUM: 8.9 mg/dL (ref 8.9–10.3)
CO2: 24 mmol/L (ref 22–32)
Chloride: 99 mmol/L — ABNORMAL LOW (ref 101–111)
Creatinine, Ser: 0.8 mg/dL (ref 0.44–1.00)
GFR calc Af Amer: >60 mL/min (ref >60)
GLUCOSE: 148 mg/dL — AB (ref 65–99)
Potassium: 4.3 mmol/L (ref 3.5–5.1)
SODIUM: 134 mmol/L — AB (ref 135–145)

## 2018-01-26 LAB — CBC
HCT: 31.3 % — ABNORMAL LOW (ref 36.0–46.0)
Hemoglobin: 9.4 g/dL — ABNORMAL LOW (ref 12.0–15.0)
MCH: 27.1 pg (ref 26.0–34.0)
MCHC: 30 g/dL (ref 30.0–36.0)
MCV: 90.2 fL (ref 78.0–100.0)
Platelets: 577 10*3/uL — ABNORMAL HIGH (ref 150–400)
RBC: 3.47 MIL/uL — ABNORMAL LOW (ref 3.87–5.11)
RDW: 19.6 % — AB (ref 11.5–15.5)
WBC: 5.2 10*3/uL (ref 4.0–10.5)

## 2018-01-27 LAB — BASIC METABOLIC PANEL
Anion gap: 11 (ref 5–15)
BUN: 14 mg/dL (ref 6–20)
CHLORIDE: 97 mmol/L — AB (ref 101–111)
CO2: 24 mmol/L (ref 22–32)
CREATININE: 0.66 mg/dL (ref 0.44–1.00)
Calcium: 8.8 mg/dL — ABNORMAL LOW (ref 8.9–10.3)
GFR calc Af Amer: >60 mL/min (ref >60)
GFR calc non Af Amer: >60 mL/min (ref >60)
Glucose, Bld: 372 mg/dL — ABNORMAL HIGH (ref 65–99)
POTASSIUM: 3.6 mmol/L (ref 3.5–5.1)
Sodium: 132 mmol/L — ABNORMAL LOW (ref 135–145)

## 2018-01-27 LAB — CBC
HEMATOCRIT: 33.3 % — AB (ref 36.0–46.0)
HEMOGLOBIN: 10.3 g/dL — AB (ref 12.0–15.0)
MCH: 27.5 pg (ref 26.0–34.0)
MCHC: 30.9 g/dL (ref 30.0–36.0)
MCV: 88.8 fL (ref 78.0–100.0)
Platelets: 613 10*3/uL — ABNORMAL HIGH (ref 150–400)
RBC: 3.75 MIL/uL — AB (ref 3.87–5.11)
RDW: 19.1 % — ABNORMAL HIGH (ref 11.5–15.5)
WBC: 3.8 10*3/uL — ABNORMAL LOW (ref 4.0–10.5)

## 2018-01-27 LAB — PHOSPHORUS: PHOSPHORUS: 3.5 mg/dL (ref 2.5–4.6)

## 2018-01-27 LAB — HEMOGLOBIN A1C
HEMOGLOBIN A1C: 5.2 % (ref 4.8–5.6)
MEAN PLASMA GLUCOSE: 102.54 mg/dL

## 2018-01-27 LAB — MAGNESIUM: Magnesium: 1.3 mg/dL — ABNORMAL LOW (ref 1.7–2.4)

## 2018-01-27 LAB — TSH: TSH: 2.117 u[IU]/mL (ref 0.350–4.500)

## 2018-01-28 LAB — COMPREHENSIVE METABOLIC PANEL
ALK PHOS: 176 U/L — AB (ref 38–126)
ALT: 31 U/L (ref 14–54)
ANION GAP: 9 (ref 5–15)
AST: 32 U/L (ref 15–41)
Albumin: 2.1 g/dL — ABNORMAL LOW (ref 3.5–5.0)
BILIRUBIN TOTAL: 0.5 mg/dL (ref 0.3–1.2)
BUN: 16 mg/dL (ref 6–20)
CALCIUM: 8.4 mg/dL — AB (ref 8.9–10.3)
CO2: 22 mmol/L (ref 22–32)
CREATININE: 0.69 mg/dL (ref 0.44–1.00)
Chloride: 103 mmol/L (ref 101–111)
GFR calc Af Amer: >60 mL/min (ref >60)
GFR calc non Af Amer: >60 mL/min (ref >60)
Glucose, Bld: 260 mg/dL — ABNORMAL HIGH (ref 65–99)
Potassium: 3.9 mmol/L (ref 3.5–5.1)
SODIUM: 134 mmol/L — AB (ref 135–145)
TOTAL PROTEIN: 5.6 g/dL — AB (ref 6.5–8.1)

## 2018-01-28 LAB — PHOSPHORUS: PHOSPHORUS: 2.9 mg/dL (ref 2.5–4.6)

## 2018-01-28 LAB — MAGNESIUM: Magnesium: 1.7 mg/dL (ref 1.7–2.4)

## 2018-01-28 LAB — TRIGLYCERIDES: Triglycerides: 154 mg/dL — ABNORMAL HIGH (ref <150)

## 2018-01-29 LAB — CBC
HCT: 30 % — ABNORMAL LOW (ref 36.0–46.0)
HEMOGLOBIN: 9.2 g/dL — AB (ref 12.0–15.0)
MCH: 27.5 pg (ref 26.0–34.0)
MCHC: 30.7 g/dL (ref 30.0–36.0)
MCV: 89.6 fL (ref 78.0–100.0)
Platelets: 496 10*3/uL — ABNORMAL HIGH (ref 150–400)
RBC: 3.35 MIL/uL — ABNORMAL LOW (ref 3.87–5.11)
RDW: 18.9 % — ABNORMAL HIGH (ref 11.5–15.5)
WBC: 5 10*3/uL (ref 4.0–10.5)

## 2018-01-29 LAB — BASIC METABOLIC PANEL
ANION GAP: 11 (ref 5–15)
BUN: 16 mg/dL (ref 6–20)
CALCIUM: 8.4 mg/dL — AB (ref 8.9–10.3)
CO2: 20 mmol/L — AB (ref 22–32)
Chloride: 104 mmol/L (ref 101–111)
Creatinine, Ser: 0.68 mg/dL (ref 0.44–1.00)
GFR calc Af Amer: >60 mL/min (ref >60)
GFR calc non Af Amer: >60 mL/min (ref >60)
GLUCOSE: 74 mg/dL (ref 65–99)
Potassium: 3.5 mmol/L (ref 3.5–5.1)
Sodium: 135 mmol/L (ref 135–145)

## 2018-01-29 LAB — MAGNESIUM: Magnesium: 1.6 mg/dL — ABNORMAL LOW (ref 1.7–2.4)

## 2018-01-30 LAB — MAGNESIUM: Magnesium: 1.6 mg/dL — ABNORMAL LOW (ref 1.7–2.4)

## 2018-01-31 LAB — BASIC METABOLIC PANEL
Anion gap: 10 (ref 5–15)
BUN: 15 mg/dL (ref 6–20)
CHLORIDE: 102 mmol/L (ref 101–111)
CO2: 23 mmol/L (ref 22–32)
CREATININE: 0.73 mg/dL (ref 0.44–1.00)
Calcium: 8.4 mg/dL — ABNORMAL LOW (ref 8.9–10.3)
GFR calc non Af Amer: >60 mL/min (ref >60)
Glucose, Bld: 304 mg/dL — ABNORMAL HIGH (ref 65–99)
POTASSIUM: 3.4 mmol/L — AB (ref 3.5–5.1)
Sodium: 135 mmol/L (ref 135–145)

## 2018-01-31 LAB — MAGNESIUM: MAGNESIUM: 1.7 mg/dL (ref 1.7–2.4)

## 2018-02-01 LAB — MAGNESIUM
MAGNESIUM: 1.3 mg/dL — AB (ref 1.7–2.4)
Magnesium: 2 mg/dL (ref 1.7–2.4)

## 2018-02-01 LAB — BASIC METABOLIC PANEL
Anion gap: 13 (ref 5–15)
BUN: 15 mg/dL (ref 6–20)
CHLORIDE: 104 mmol/L (ref 101–111)
CO2: 20 mmol/L — ABNORMAL LOW (ref 22–32)
Calcium: 8.7 mg/dL — ABNORMAL LOW (ref 8.9–10.3)
Creatinine, Ser: 0.63 mg/dL (ref 0.44–1.00)
GFR calc Af Amer: >60 mL/min (ref >60)
GFR calc non Af Amer: >60 mL/min (ref >60)
GLUCOSE: 295 mg/dL — AB (ref 65–99)
POTASSIUM: 3.7 mmol/L (ref 3.5–5.1)
SODIUM: 137 mmol/L (ref 135–145)

## 2018-02-02 LAB — URINALYSIS, ROUTINE W REFLEX MICROSCOPIC
BILIRUBIN URINE: NEGATIVE
Bacteria, UA: NONE SEEN
Glucose, UA: NEGATIVE mg/dL
HGB URINE DIPSTICK: NEGATIVE
Ketones, ur: NEGATIVE mg/dL
NITRITE: NEGATIVE
PH: 6 (ref 5.0–8.0)
Protein, ur: NEGATIVE mg/dL
SPECIFIC GRAVITY, URINE: 1.015 (ref 1.005–1.030)
SQUAMOUS EPITHELIAL / LPF: NONE SEEN

## 2018-02-04 ENCOUNTER — Ambulatory Visit: Payer: Medicare Other | Admitting: Oncology

## 2018-02-04 LAB — COMPREHENSIVE METABOLIC PANEL
ALT: 39 U/L (ref 14–54)
AST: 32 U/L (ref 15–41)
Albumin: 2.2 g/dL — ABNORMAL LOW (ref 3.5–5.0)
Alkaline Phosphatase: 185 U/L — ABNORMAL HIGH (ref 38–126)
Anion gap: 11 (ref 5–15)
BILIRUBIN TOTAL: 0.6 mg/dL (ref 0.3–1.2)
BUN: 14 mg/dL (ref 6–20)
CO2: 22 mmol/L (ref 22–32)
Calcium: 8.9 mg/dL (ref 8.9–10.3)
Chloride: 105 mmol/L (ref 101–111)
Creatinine, Ser: 0.6 mg/dL (ref 0.44–1.00)
GFR calc Af Amer: >60 mL/min (ref >60)
Glucose, Bld: 169 mg/dL — ABNORMAL HIGH (ref 65–99)
Potassium: 3.3 mmol/L — ABNORMAL LOW (ref 3.5–5.1)
Sodium: 138 mmol/L (ref 135–145)
TOTAL PROTEIN: 5.7 g/dL — AB (ref 6.5–8.1)

## 2018-02-04 LAB — URINE CULTURE

## 2018-02-04 LAB — CBC
HCT: 31.2 % — ABNORMAL LOW (ref 36.0–46.0)
Hemoglobin: 9.8 g/dL — ABNORMAL LOW (ref 12.0–15.0)
MCH: 28.2 pg (ref 26.0–34.0)
MCHC: 31.4 g/dL (ref 30.0–36.0)
MCV: 89.9 fL (ref 78.0–100.0)
PLATELETS: 396 10*3/uL (ref 150–400)
RBC: 3.47 MIL/uL — ABNORMAL LOW (ref 3.87–5.11)
RDW: 17.9 % — AB (ref 11.5–15.5)
WBC: 7.1 10*3/uL (ref 4.0–10.5)

## 2018-02-04 LAB — MAGNESIUM: MAGNESIUM: 1.2 mg/dL — AB (ref 1.7–2.4)

## 2018-02-04 LAB — PHOSPHORUS: Phosphorus: 4.1 mg/dL (ref 2.5–4.6)

## 2018-02-04 LAB — TRIGLYCERIDES: Triglycerides: 130 mg/dL (ref <150)

## 2018-02-05 LAB — BASIC METABOLIC PANEL
Anion gap: 9 (ref 5–15)
BUN: 15 mg/dL (ref 6–20)
CO2: 23 mmol/L (ref 22–32)
CREATININE: 0.61 mg/dL (ref 0.44–1.00)
Calcium: 8.7 mg/dL — ABNORMAL LOW (ref 8.9–10.3)
Chloride: 102 mmol/L (ref 101–111)
GFR calc Af Amer: >60 mL/min (ref >60)
Glucose, Bld: 213 mg/dL — ABNORMAL HIGH (ref 65–99)
Potassium: 3.8 mmol/L (ref 3.5–5.1)
Sodium: 134 mmol/L — ABNORMAL LOW (ref 135–145)

## 2018-02-05 LAB — PHOSPHORUS: Phosphorus: 3.8 mg/dL (ref 2.5–4.6)

## 2018-02-05 LAB — MAGNESIUM: MAGNESIUM: 2.2 mg/dL (ref 1.7–2.4)

## 2018-02-06 LAB — BASIC METABOLIC PANEL
Anion gap: 12 (ref 5–15)
BUN: 18 mg/dL (ref 6–20)
CALCIUM: 8.9 mg/dL (ref 8.9–10.3)
CHLORIDE: 102 mmol/L (ref 101–111)
CO2: 23 mmol/L (ref 22–32)
CREATININE: 0.69 mg/dL (ref 0.44–1.00)
GFR calc non Af Amer: >60 mL/min (ref >60)
Glucose, Bld: 110 mg/dL — ABNORMAL HIGH (ref 65–99)
Potassium: 3.9 mmol/L (ref 3.5–5.1)
SODIUM: 137 mmol/L (ref 135–145)

## 2018-02-08 LAB — CBC
HEMATOCRIT: 29.1 % — AB (ref 36.0–46.0)
Hemoglobin: 9.2 g/dL — ABNORMAL LOW (ref 12.0–15.0)
MCH: 28.5 pg (ref 26.0–34.0)
MCHC: 31.6 g/dL (ref 30.0–36.0)
MCV: 90.1 fL (ref 78.0–100.0)
Platelets: 328 10*3/uL (ref 150–400)
RBC: 3.23 MIL/uL — AB (ref 3.87–5.11)
RDW: 17.3 % — ABNORMAL HIGH (ref 11.5–15.5)
WBC: 6.3 10*3/uL (ref 4.0–10.5)

## 2018-02-08 LAB — BASIC METABOLIC PANEL
Anion gap: 10 (ref 5–15)
BUN: 21 mg/dL — ABNORMAL HIGH (ref 6–20)
CHLORIDE: 104 mmol/L (ref 101–111)
CO2: 21 mmol/L — ABNORMAL LOW (ref 22–32)
Calcium: 8.9 mg/dL (ref 8.9–10.3)
Creatinine, Ser: 0.69 mg/dL (ref 0.44–1.00)
GFR calc non Af Amer: >60 mL/min (ref >60)
Glucose, Bld: 171 mg/dL — ABNORMAL HIGH (ref 65–99)
POTASSIUM: 3.9 mmol/L (ref 3.5–5.1)
SODIUM: 135 mmol/L (ref 135–145)

## 2018-02-08 LAB — PHOSPHORUS: Phosphorus: 4.8 mg/dL — ABNORMAL HIGH (ref 2.5–4.6)

## 2018-02-08 LAB — MAGNESIUM: Magnesium: 1.4 mg/dL — ABNORMAL LOW (ref 1.7–2.4)

## 2018-02-09 LAB — BASIC METABOLIC PANEL
ANION GAP: 12 (ref 5–15)
BUN: 25 mg/dL — ABNORMAL HIGH (ref 6–20)
CALCIUM: 8.9 mg/dL (ref 8.9–10.3)
CO2: 22 mmol/L (ref 22–32)
CREATININE: 0.68 mg/dL (ref 0.44–1.00)
Chloride: 102 mmol/L (ref 101–111)
Glucose, Bld: 129 mg/dL — ABNORMAL HIGH (ref 65–99)
Potassium: 4.1 mmol/L (ref 3.5–5.1)
Sodium: 136 mmol/L (ref 135–145)

## 2018-02-09 LAB — MAGNESIUM: MAGNESIUM: 1.9 mg/dL (ref 1.7–2.4)

## 2018-02-11 LAB — COMPREHENSIVE METABOLIC PANEL
ALBUMIN: 2.5 g/dL — AB (ref 3.5–5.0)
ALT: 31 U/L (ref 14–54)
ANION GAP: 10 (ref 5–15)
AST: 26 U/L (ref 15–41)
Alkaline Phosphatase: 170 U/L — ABNORMAL HIGH (ref 38–126)
BILIRUBIN TOTAL: 0.3 mg/dL (ref 0.3–1.2)
BUN: 25 mg/dL — AB (ref 6–20)
CHLORIDE: 99 mmol/L — AB (ref 101–111)
CO2: 26 mmol/L (ref 22–32)
Calcium: 9.1 mg/dL (ref 8.9–10.3)
Creatinine, Ser: 0.69 mg/dL (ref 0.44–1.00)
GFR calc Af Amer: >60 mL/min (ref >60)
GFR calc non Af Amer: >60 mL/min (ref >60)
GLUCOSE: 150 mg/dL — AB (ref 65–99)
POTASSIUM: 3.9 mmol/L (ref 3.5–5.1)
Sodium: 135 mmol/L (ref 135–145)
Total Protein: 6.5 g/dL (ref 6.5–8.1)

## 2018-02-11 LAB — MAGNESIUM: Magnesium: 1.6 mg/dL — ABNORMAL LOW (ref 1.7–2.4)

## 2018-02-11 LAB — PHOSPHORUS: Phosphorus: 4.5 mg/dL (ref 2.5–4.6)

## 2018-02-11 LAB — TRIGLYCERIDES: Triglycerides: 104 mg/dL (ref <150)

## 2018-02-12 LAB — MAGNESIUM: MAGNESIUM: 2 mg/dL (ref 1.7–2.4)

## 2018-02-13 LAB — BASIC METABOLIC PANEL
ANION GAP: 9 (ref 5–15)
BUN: 26 mg/dL — ABNORMAL HIGH (ref 6–20)
CALCIUM: 8.8 mg/dL — AB (ref 8.9–10.3)
CHLORIDE: 101 mmol/L (ref 101–111)
CO2: 22 mmol/L (ref 22–32)
Creatinine, Ser: 0.73 mg/dL (ref 0.44–1.00)
GFR calc non Af Amer: >60 mL/min (ref >60)
Glucose, Bld: 245 mg/dL — ABNORMAL HIGH (ref 65–99)
POTASSIUM: 4.4 mmol/L (ref 3.5–5.1)
Sodium: 132 mmol/L — ABNORMAL LOW (ref 135–145)

## 2018-02-14 LAB — CBC
HCT: 32.6 % — ABNORMAL LOW (ref 36.0–46.0)
Hemoglobin: 10.2 g/dL — ABNORMAL LOW (ref 12.0–15.0)
MCH: 27.9 pg (ref 26.0–34.0)
MCHC: 31.3 g/dL (ref 30.0–36.0)
MCV: 89.1 fL (ref 78.0–100.0)
Platelets: 436 10*3/uL — ABNORMAL HIGH (ref 150–400)
RBC: 3.66 MIL/uL — AB (ref 3.87–5.11)
RDW: 16.7 % — ABNORMAL HIGH (ref 11.5–15.5)
WBC: 5 10*3/uL (ref 4.0–10.5)

## 2018-02-15 DIAGNOSIS — D649 Anemia, unspecified: Secondary | ICD-10-CM | POA: Diagnosis not present

## 2018-02-15 DIAGNOSIS — H409 Unspecified glaucoma: Secondary | ICD-10-CM | POA: Diagnosis not present

## 2018-02-15 DIAGNOSIS — R627 Adult failure to thrive: Secondary | ICD-10-CM | POA: Diagnosis not present

## 2018-02-15 DIAGNOSIS — Z681 Body mass index (BMI) 19 or less, adult: Secondary | ICD-10-CM | POA: Diagnosis not present

## 2018-02-15 DIAGNOSIS — C7A8 Other malignant neuroendocrine tumors: Secondary | ICD-10-CM | POA: Diagnosis not present

## 2018-02-15 DIAGNOSIS — R1312 Dysphagia, oropharyngeal phase: Secondary | ICD-10-CM | POA: Diagnosis not present

## 2018-02-15 DIAGNOSIS — E43 Unspecified severe protein-calorie malnutrition: Secondary | ICD-10-CM | POA: Diagnosis not present

## 2018-02-15 DIAGNOSIS — R4182 Altered mental status, unspecified: Secondary | ICD-10-CM | POA: Diagnosis not present

## 2018-02-15 DIAGNOSIS — Z931 Gastrostomy status: Secondary | ICD-10-CM | POA: Diagnosis not present

## 2018-02-15 DIAGNOSIS — Z7982 Long term (current) use of aspirin: Secondary | ICD-10-CM | POA: Diagnosis not present

## 2018-02-15 DIAGNOSIS — M6281 Muscle weakness (generalized): Secondary | ICD-10-CM | POA: Diagnosis not present

## 2018-02-15 DIAGNOSIS — R488 Other symbolic dysfunctions: Secondary | ICD-10-CM | POA: Diagnosis not present

## 2018-02-15 DIAGNOSIS — E1142 Type 2 diabetes mellitus with diabetic polyneuropathy: Secondary | ICD-10-CM | POA: Diagnosis not present

## 2018-02-15 DIAGNOSIS — R112 Nausea with vomiting, unspecified: Secondary | ICD-10-CM | POA: Diagnosis not present

## 2018-02-15 DIAGNOSIS — R2681 Unsteadiness on feet: Secondary | ICD-10-CM | POA: Diagnosis not present

## 2018-02-15 DIAGNOSIS — F411 Generalized anxiety disorder: Secondary | ICD-10-CM | POA: Diagnosis not present

## 2018-02-15 DIAGNOSIS — E785 Hyperlipidemia, unspecified: Secondary | ICD-10-CM | POA: Diagnosis not present

## 2018-02-15 DIAGNOSIS — E1140 Type 2 diabetes mellitus with diabetic neuropathy, unspecified: Secondary | ICD-10-CM | POA: Diagnosis not present

## 2018-02-15 DIAGNOSIS — R1310 Dysphagia, unspecified: Secondary | ICD-10-CM | POA: Diagnosis not present

## 2018-02-15 DIAGNOSIS — R5381 Other malaise: Secondary | ICD-10-CM | POA: Diagnosis not present

## 2018-02-15 DIAGNOSIS — Z7901 Long term (current) use of anticoagulants: Secondary | ICD-10-CM | POA: Diagnosis not present

## 2018-02-15 DIAGNOSIS — Z9049 Acquired absence of other specified parts of digestive tract: Secondary | ICD-10-CM | POA: Diagnosis not present

## 2018-02-15 DIAGNOSIS — E1165 Type 2 diabetes mellitus with hyperglycemia: Secondary | ICD-10-CM | POA: Diagnosis not present

## 2018-02-15 DIAGNOSIS — K56609 Unspecified intestinal obstruction, unspecified as to partial versus complete obstruction: Secondary | ICD-10-CM | POA: Diagnosis not present

## 2018-02-15 DIAGNOSIS — Z7951 Long term (current) use of inhaled steroids: Secondary | ICD-10-CM | POA: Diagnosis not present

## 2018-02-15 DIAGNOSIS — E7849 Other hyperlipidemia: Secondary | ICD-10-CM | POA: Diagnosis not present

## 2018-02-15 DIAGNOSIS — M25511 Pain in right shoulder: Secondary | ICD-10-CM | POA: Diagnosis not present

## 2018-02-15 DIAGNOSIS — L98499 Non-pressure chronic ulcer of skin of other sites with unspecified severity: Secondary | ICD-10-CM | POA: Diagnosis not present

## 2018-02-15 DIAGNOSIS — L89153 Pressure ulcer of sacral region, stage 3: Secondary | ICD-10-CM | POA: Diagnosis not present

## 2018-02-15 DIAGNOSIS — M1280 Other specific arthropathies, not elsewhere classified, unspecified site: Secondary | ICD-10-CM | POA: Diagnosis not present

## 2018-02-15 DIAGNOSIS — R630 Anorexia: Secondary | ICD-10-CM | POA: Diagnosis not present

## 2018-02-15 DIAGNOSIS — Z794 Long term (current) use of insulin: Secondary | ICD-10-CM | POA: Diagnosis not present

## 2018-02-15 DIAGNOSIS — E46 Unspecified protein-calorie malnutrition: Secondary | ICD-10-CM | POA: Diagnosis not present

## 2018-02-15 DIAGNOSIS — I10 Essential (primary) hypertension: Secondary | ICD-10-CM | POA: Diagnosis not present

## 2018-02-15 DIAGNOSIS — M810 Age-related osteoporosis without current pathological fracture: Secondary | ICD-10-CM | POA: Diagnosis not present

## 2018-02-15 DIAGNOSIS — K5649 Other impaction of intestine: Secondary | ICD-10-CM | POA: Diagnosis not present

## 2018-02-15 DIAGNOSIS — C786 Secondary malignant neoplasm of retroperitoneum and peritoneum: Secondary | ICD-10-CM | POA: Diagnosis not present

## 2018-02-15 DIAGNOSIS — F339 Major depressive disorder, recurrent, unspecified: Secondary | ICD-10-CM | POA: Diagnosis not present

## 2018-02-15 DIAGNOSIS — R739 Hyperglycemia, unspecified: Secondary | ICD-10-CM | POA: Diagnosis not present

## 2018-02-15 DIAGNOSIS — N390 Urinary tract infection, site not specified: Secondary | ICD-10-CM | POA: Diagnosis not present

## 2018-02-15 LAB — BASIC METABOLIC PANEL
Anion gap: 8 (ref 5–15)
BUN: 23 mg/dL — ABNORMAL HIGH (ref 6–20)
CHLORIDE: 104 mmol/L (ref 101–111)
CO2: 25 mmol/L (ref 22–32)
Calcium: 8.6 mg/dL — ABNORMAL LOW (ref 8.9–10.3)
Creatinine, Ser: 0.79 mg/dL (ref 0.44–1.00)
GFR calc Af Amer: >60 mL/min (ref >60)
GFR calc non Af Amer: >60 mL/min (ref >60)
Glucose, Bld: 144 mg/dL — ABNORMAL HIGH (ref 65–99)
POTASSIUM: 4.1 mmol/L (ref 3.5–5.1)
SODIUM: 137 mmol/L (ref 135–145)

## 2018-02-19 DIAGNOSIS — I10 Essential (primary) hypertension: Secondary | ICD-10-CM | POA: Diagnosis not present

## 2018-02-19 DIAGNOSIS — E1142 Type 2 diabetes mellitus with diabetic polyneuropathy: Secondary | ICD-10-CM | POA: Diagnosis not present

## 2018-02-19 DIAGNOSIS — E43 Unspecified severe protein-calorie malnutrition: Secondary | ICD-10-CM | POA: Diagnosis not present

## 2018-02-19 DIAGNOSIS — Z9049 Acquired absence of other specified parts of digestive tract: Secondary | ICD-10-CM | POA: Diagnosis not present

## 2018-02-21 DIAGNOSIS — L98499 Non-pressure chronic ulcer of skin of other sites with unspecified severity: Secondary | ICD-10-CM | POA: Diagnosis not present

## 2018-02-21 DIAGNOSIS — L89153 Pressure ulcer of sacral region, stage 3: Secondary | ICD-10-CM | POA: Diagnosis not present

## 2018-02-28 ENCOUNTER — Telehealth: Payer: Self-pay | Admitting: *Deleted

## 2018-02-28 DIAGNOSIS — L89153 Pressure ulcer of sacral region, stage 3: Secondary | ICD-10-CM | POA: Diagnosis not present

## 2018-02-28 NOTE — Telephone Encounter (Signed)
Received call from patient niece, Seth Bake.   Reports that she would like to discuss palliative care with MD. Reports that she would like to have discussion before orders are placed.   Please call her at [phone]~ telephone.

## 2018-03-01 NOTE — Telephone Encounter (Signed)
Call palliative care- Pt has Carcnioid tumor but severe protein calorie nutrition and has a Chronic PICC line for nutrition. She is currently in a nursing home but due to finances, will likely need to leave  Family needs someone to help with PICC line and CNA, assist with bathing, dressing as needed See if they provide this care.

## 2018-03-04 NOTE — Telephone Encounter (Signed)
Call placed to Hospice and [place].   Reports that if PICC line is long term and the only means for nutrition, Hospice cannot manage. States that Palliative care does not turn away any patient regardless of financial means or complications of care. States that NP will go out to home to evaluate patient, but [facility] will need to be involved for PICC line care and PCS.   MD to be made aware.

## 2018-03-05 DIAGNOSIS — R630 Anorexia: Secondary | ICD-10-CM | POA: Diagnosis not present

## 2018-03-05 DIAGNOSIS — R1312 Dysphagia, oropharyngeal phase: Secondary | ICD-10-CM | POA: Diagnosis not present

## 2018-03-05 DIAGNOSIS — C786 Secondary malignant neoplasm of retroperitoneum and peritoneum: Secondary | ICD-10-CM | POA: Diagnosis not present

## 2018-03-05 NOTE — Telephone Encounter (Signed)
Spoke with pt is good to be her caregiver when she gets released from a nursing home.  They cannot afford to keep her in the nursing home as she over qualifies for Medicaid.  He is going to bring her home but will need some help at home and she does have a PICC line.  Advised him that we can get palliative care to work with.  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  But we will need another service to help with her PICC line as it is unknown how long she will have this.  We will get in contact with [facility] to see when her actual discharge date is and to help this transition to home.  She Does need 24-hour care

## 2018-03-19 DIAGNOSIS — M25511 Pain in right shoulder: Secondary | ICD-10-CM | POA: Diagnosis not present

## 2018-03-20 DIAGNOSIS — E43 Unspecified severe protein-calorie malnutrition: Secondary | ICD-10-CM | POA: Diagnosis not present

## 2018-03-20 DIAGNOSIS — R1312 Dysphagia, oropharyngeal phase: Secondary | ICD-10-CM | POA: Diagnosis not present

## 2018-03-20 DIAGNOSIS — Z9049 Acquired absence of other specified parts of digestive tract: Secondary | ICD-10-CM | POA: Diagnosis not present

## 2018-03-21 DIAGNOSIS — E1140 Type 2 diabetes mellitus with diabetic neuropathy, unspecified: Secondary | ICD-10-CM | POA: Diagnosis not present

## 2018-03-21 DIAGNOSIS — F339 Major depressive disorder, recurrent, unspecified: Secondary | ICD-10-CM | POA: Diagnosis not present

## 2018-03-21 DIAGNOSIS — Z9049 Acquired absence of other specified parts of digestive tract: Secondary | ICD-10-CM | POA: Diagnosis not present

## 2018-03-21 DIAGNOSIS — E43 Unspecified severe protein-calorie malnutrition: Secondary | ICD-10-CM | POA: Diagnosis not present

## 2018-03-25 DIAGNOSIS — E1140 Type 2 diabetes mellitus with diabetic neuropathy, unspecified: Secondary | ICD-10-CM | POA: Diagnosis not present

## 2018-03-25 DIAGNOSIS — N390 Urinary tract infection, site not specified: Secondary | ICD-10-CM | POA: Diagnosis not present

## 2018-03-25 DIAGNOSIS — E43 Unspecified severe protein-calorie malnutrition: Secondary | ICD-10-CM | POA: Diagnosis not present

## 2018-03-26 DIAGNOSIS — Z9049 Acquired absence of other specified parts of digestive tract: Secondary | ICD-10-CM | POA: Diagnosis not present

## 2018-03-26 DIAGNOSIS — E1165 Type 2 diabetes mellitus with hyperglycemia: Secondary | ICD-10-CM | POA: Diagnosis not present

## 2018-03-26 DIAGNOSIS — R1312 Dysphagia, oropharyngeal phase: Secondary | ICD-10-CM | POA: Diagnosis not present

## 2018-03-26 DIAGNOSIS — E43 Unspecified severe protein-calorie malnutrition: Secondary | ICD-10-CM | POA: Diagnosis not present

## 2018-04-03 ENCOUNTER — Other Ambulatory Visit (HOSPITAL_COMMUNITY): Payer: Self-pay | Admitting: Family Medicine

## 2018-04-03 DIAGNOSIS — R1312 Dysphagia, oropharyngeal phase: Secondary | ICD-10-CM

## 2018-04-08 ENCOUNTER — Other Ambulatory Visit: Payer: Self-pay | Admitting: Radiology

## 2018-04-09 ENCOUNTER — Ambulatory Visit (HOSPITAL_COMMUNITY)
Admission: RE | Admit: 2018-04-09 | Discharge: 2018-04-09 | Disposition: A | Discharge status: Home / Self Care | Payer: Medicare Other | Source: Ambulatory Visit | Attending: Family Medicine | Admitting: Family Medicine

## 2018-04-09 DIAGNOSIS — R1312 Dysphagia, oropharyngeal phase: Secondary | ICD-10-CM

## 2018-04-10 ENCOUNTER — Other Ambulatory Visit: Payer: Self-pay | Admitting: Radiology

## 2018-04-10 DIAGNOSIS — R1312 Dysphagia, oropharyngeal phase: Secondary | ICD-10-CM | POA: Diagnosis not present

## 2018-04-10 DIAGNOSIS — Z9049 Acquired absence of other specified parts of digestive tract: Secondary | ICD-10-CM | POA: Diagnosis not present

## 2018-04-10 DIAGNOSIS — R4182 Altered mental status, unspecified: Secondary | ICD-10-CM | POA: Diagnosis not present

## 2018-04-10 DIAGNOSIS — E43 Unspecified severe protein-calorie malnutrition: Secondary | ICD-10-CM | POA: Diagnosis not present

## 2018-04-10 DIAGNOSIS — E1140 Type 2 diabetes mellitus with diabetic neuropathy, unspecified: Secondary | ICD-10-CM | POA: Diagnosis not present

## 2018-04-11 ENCOUNTER — Encounter (HOSPITAL_COMMUNITY): Payer: Self-pay

## 2018-04-11 ENCOUNTER — Ambulatory Visit (HOSPITAL_COMMUNITY)
Admission: RE | Admit: 2018-04-11 | Discharge: 2018-04-11 | Disposition: A | Discharge status: Home / Self Care | Payer: Medicare Other | Source: Ambulatory Visit | Attending: Family Medicine | Admitting: Family Medicine

## 2018-04-11 DIAGNOSIS — R627 Adult failure to thrive: Secondary | ICD-10-CM | POA: Insufficient documentation

## 2018-04-11 DIAGNOSIS — M810 Age-related osteoporosis without current pathological fracture: Secondary | ICD-10-CM | POA: Insufficient documentation

## 2018-04-11 DIAGNOSIS — Z7901 Long term (current) use of anticoagulants: Secondary | ICD-10-CM | POA: Insufficient documentation

## 2018-04-11 DIAGNOSIS — E46 Unspecified protein-calorie malnutrition: Secondary | ICD-10-CM | POA: Insufficient documentation

## 2018-04-11 DIAGNOSIS — Z7982 Long term (current) use of aspirin: Secondary | ICD-10-CM | POA: Insufficient documentation

## 2018-04-11 DIAGNOSIS — Z681 Body mass index (BMI) 19 or less, adult: Secondary | ICD-10-CM | POA: Diagnosis not present

## 2018-04-11 DIAGNOSIS — I10 Essential (primary) hypertension: Secondary | ICD-10-CM | POA: Insufficient documentation

## 2018-04-11 DIAGNOSIS — E1140 Type 2 diabetes mellitus with diabetic neuropathy, unspecified: Secondary | ICD-10-CM | POA: Diagnosis not present

## 2018-04-11 DIAGNOSIS — Z794 Long term (current) use of insulin: Secondary | ICD-10-CM | POA: Insufficient documentation

## 2018-04-11 DIAGNOSIS — R1312 Dysphagia, oropharyngeal phase: Secondary | ICD-10-CM | POA: Diagnosis not present

## 2018-04-11 DIAGNOSIS — Z7951 Long term (current) use of inhaled steroids: Secondary | ICD-10-CM | POA: Insufficient documentation

## 2018-04-11 DIAGNOSIS — E785 Hyperlipidemia, unspecified: Secondary | ICD-10-CM | POA: Insufficient documentation

## 2018-04-11 DIAGNOSIS — H409 Unspecified glaucoma: Secondary | ICD-10-CM | POA: Insufficient documentation

## 2018-04-11 HISTORY — PX: IR GASTROSTOMY TUBE MOD SED: IMG625

## 2018-04-11 LAB — CBC
HEMATOCRIT: 32.1 % — AB (ref 36.0–46.0)
HEMOGLOBIN: 10.2 g/dL — AB (ref 12.0–15.0)
MCH: 24.3 pg — ABNORMAL LOW (ref 26.0–34.0)
MCHC: 31.8 g/dL (ref 30.0–36.0)
MCV: 76.6 fL — AB (ref 78.0–100.0)
Platelets: 501 10*3/uL — ABNORMAL HIGH (ref 150–400)
RBC: 4.19 MIL/uL (ref 3.87–5.11)
RDW: 16.9 % — ABNORMAL HIGH (ref 11.5–15.5)
WBC: 6.1 10*3/uL (ref 4.0–10.5)

## 2018-04-11 LAB — BASIC METABOLIC PANEL
ANION GAP: 8 (ref 5–15)
BUN: 27 mg/dL — AB (ref 6–20)
CHLORIDE: 105 mmol/L (ref 101–111)
CO2: 24 mmol/L (ref 22–32)
Calcium: 9.4 mg/dL (ref 8.9–10.3)
Creatinine, Ser: 0.85 mg/dL (ref 0.44–1.00)
Glucose, Bld: 71 mg/dL (ref 65–99)
POTASSIUM: 3.8 mmol/L (ref 3.5–5.1)
SODIUM: 137 mmol/L (ref 135–145)

## 2018-04-11 LAB — PROTIME-INR
INR: 1.05
Prothrombin Time: 13.6 seconds (ref 11.4–15.2)

## 2018-04-11 LAB — GLUCOSE, CAPILLARY
GLUCOSE-CAPILLARY: 61 mg/dL — AB (ref 65–99)
GLUCOSE-CAPILLARY: 136 mg/dL — AB (ref 65–99)
Glucose-Capillary: 63 mg/dL — ABNORMAL LOW (ref 65–99)

## 2018-04-11 MED ORDER — DEXTROSE 50 % IV SOLN
INTRAVENOUS | Status: AC
  Administered 2018-04-11: 25 mL via INTRAVENOUS
  Filled 2018-04-11: qty 50

## 2018-04-11 MED ORDER — LIDOCAINE HCL 1 % IJ SOLN
INTRAMUSCULAR | Status: AC
  Filled 2018-04-11: qty 20

## 2018-04-11 MED ORDER — CEFAZOLIN SODIUM-DEXTROSE 2-4 GM/100ML-% IV SOLN
INTRAVENOUS | Status: AC
  Filled 2018-04-11: qty 100

## 2018-04-11 MED ORDER — MIDAZOLAM HCL 2 MG/2ML IJ SOLN
INTRAMUSCULAR | Status: AC
  Filled 2018-04-11: qty 2

## 2018-04-11 MED ORDER — GLUCAGON HCL RDNA (DIAGNOSTIC) 1 MG IJ SOLR
INTRAMUSCULAR | Status: AC | PRN
  Administered 2018-04-11: .5 mg via INTRAVENOUS

## 2018-04-11 MED ORDER — FENTANYL CITRATE (PF) 100 MCG/2ML IJ SOLN
INTRAMUSCULAR | Status: AC | PRN
  Administered 2018-04-11: 25 ug via INTRAVENOUS

## 2018-04-11 MED ORDER — IOPAMIDOL (ISOVUE-300) INJECTION 61%
INTRAVENOUS | Status: AC
  Administered 2018-04-11: 15 mL
  Filled 2018-04-11: qty 50

## 2018-04-11 MED ORDER — SODIUM CHLORIDE 0.9 % IV SOLN: INTRAVENOUS | Status: DC

## 2018-04-11 MED ORDER — FENTANYL CITRATE (PF) 100 MCG/2ML IJ SOLN
INTRAMUSCULAR | Status: AC
  Filled 2018-04-11: qty 2

## 2018-04-11 MED ORDER — MIDAZOLAM HCL 2 MG/2ML IJ SOLN
INTRAMUSCULAR | Status: AC | PRN
  Administered 2018-04-11: 1 mg via INTRAVENOUS

## 2018-04-11 MED ORDER — GLUCAGON HCL RDNA (DIAGNOSTIC) 1 MG IJ SOLR
INTRAMUSCULAR | Status: AC
  Filled 2018-04-11: qty 1

## 2018-04-11 MED ORDER — ONDANSETRON HCL 4 MG/2ML IJ SOLN: 4.0000 mg | INTRAMUSCULAR | Status: DC | PRN

## 2018-04-11 MED ORDER — SODIUM CHLORIDE 0.9 % IV SOLN
INTRAVENOUS | Status: AC | PRN
  Administered 2018-04-11: 10 mL/h via INTRAVENOUS

## 2018-04-11 MED ORDER — DEXTROSE 50 % IV SOLN
25.0000 mL | Freq: Once | INTRAVENOUS | Status: AC
  Administered 2018-04-11: 25 mL via INTRAVENOUS

## 2018-04-11 MED ORDER — HYDROMORPHONE HCL 2 MG/ML IJ SOLN: 1.0000 mg | INTRAMUSCULAR | Status: DC | PRN

## 2018-04-11 MED ORDER — CEFAZOLIN SODIUM-DEXTROSE 2-4 GM/100ML-% IV SOLN
2.0000 g | INTRAVENOUS | Status: AC
Start: 2018-04-11 — End: 2018-04-11
  Administered 2018-04-11: 2 g via INTRAVENOUS

## 2018-04-11 MED ORDER — LIDOCAINE HCL (PF) 1 % IJ SOLN
INTRAMUSCULAR | Status: AC | PRN
  Administered 2018-04-11: 5 mL

## 2018-04-11 MED ORDER — HYDROCODONE-ACETAMINOPHEN 5-325 MG PO TABS: 1.0000 | ORAL_TABLET | ORAL | Status: DC | PRN

## 2018-04-11 NOTE — Sedation Documentation (Signed)
Called to give report. Nurse unavailable. Will call back 

## 2018-04-11 NOTE — Procedures (Signed)
  Procedure: Gastrostomy catheter placement 20f EBL:   minimal Complications:  none immediate  See full dictation in [facility] PACS.  D. Anaih Brander MD Main # [phone] Pager  [phone]    

## 2018-04-11 NOTE — H&P (Addendum)
Chief Complaint: Patient was seen in consultation today for percutaneous gastric tube placement at the request of [place]   Supervising Physician: Arne Cleveland  Patient Status: [facility] - Out-pt  History of Present Illness: Theresa Howe is a 84 y.o. female   Skilled Nursing facility pt Failure to thrive Protein Calorie Malnutrition Requested percutaneous gastric tube placement  Was scheduled 2 days ago but had not held Eliquis Now off since then  Permit was gained from nephew  Past Medical History:  Diagnosis Date  . Abdominal mass 10/2017   SMALL BOWEL  . Diabetes mellitus    type 2  . Dyspnea   . Failure to thrive (0-17) 12/24/2017  . Glaucoma   . Hyperlipidemia   . Hypertension   . Neuropathy   . Osteoporosis   . Wheezing     Past Surgical History:  Procedure Laterality Date  . ABDOMINAL HYSTERECTOMY    . CATARACT EXTRACTION Right 01/2010  . COLON SURGERY    . COLONOSCOPY    . EUS N/A 04/17/2013   Procedure: UPPER ENDOSCOPIC ULTRASOUND (EUS) LINEAR;  Surgeon: Milus Banister, MD;  Location: [facility];  Service: Endoscopy;  Laterality: N/A;  . GLAUCOMA SURGERY  2007  . IR FLUORO GUIDE CV LINE RIGHT  01/17/2018  . IR IMAGE GUIDED DRAINAGE PERCUT CATH  PERITONEAL RETROPERIT  01/09/2018  . IR SINUS/FIST TUBE CHK-NON GI  01/18/2018  . IR US GUIDE VASC ACCESS RIGHT  01/17/2018  . LAPAROSCOPIC SMALL BOWEL RESECTION  12/04/2017  . LAPAROSCOPIC SMALL BOWEL RESECTION N/A 12/04/2017   Procedure: LAPAROSCOPIC SMALL BOWEL RESECTION;  Surgeon: Stark Klein, MD;  Location: [facility];  Service: General;  Laterality: N/A;  . LAPAROSCOPY N/A 12/04/2017   Procedure: LAPAROSCOPY DIAGNOSTIC;  Surgeon: Stark Klein, MD;  Location: [place];  Service: General;  Laterality: N/A;  . LAPAROSCOPY N/A 12/27/2017   Procedure: DIAGNOSTIC LAPAROSCOPY/ LAPAROTOMY WITH SMALL BOWEL RESECTION TIMES TWO;  Surgeon: Stark Klein, MD;  Location: [place];  Service: General;  Laterality:  N/A;  . MULTIPLE TOOTH EXTRACTIONS    . PICC LINE INSERTION  12/24/2017    Allergies: Patient has no known allergies.  Medications: Prior to Admission medications   Medication Sig Start Date End Date Taking? Authorizing Provider  albuterol (PROAIR HFA) 108 (90 Base) MCG/ACT inhaler Inhale 2 puffs into the lungs every 6 (six) hours as needed for wheezing or shortness of breath. 12/23/15  Yes [place], Modena Nunnery, MD  alendronate (FOSAMAX) 70 MG tablet TAKE 1 TABLET EVERY 7 DAYS WITH A FULL GLASS OF WATER ON AN EMPTY STOMACH 12/25/17  Yes [place], Modena Nunnery, MD  apixaban (ELIQUIS) 2.5 MG TABS tablet Take 2.5 mg by mouth 2 (two) times daily.   Yes [provider]  aspirin 81 MG tablet Take 81 mg by mouth daily.     Yes [provider]  Calcium Carbonate-Vitamin D (CALCIUM 600 + D PO) Take 1 tablet by mouth daily.    Yes [provider]  chlorhexidine (PERIDEX) 0.12 % solution 15 mLs by Mouth Rinse route 2 (two) times daily. 01/24/18  Yes Stark Klein, MD  clotrimazole (MYCELEX) 10 MG troche Take 1 tablet (10 mg total) by mouth 5 (five) times daily. 01/24/18  Yes Stark Klein, MD  collagenase (SANTYL) ointment Apply 1 application topically daily. Left buttock   Yes [provider]  dorzolamide (TRUSOPT) 2 % ophthalmic solution Place 1 drop into both eyes 2 (two) times daily. 06/02/16  Yes [place], [place],  MD  dronabinol (MARINOL) 2.5 MG capsule Take 2.5 mg by mouth 2 (two) times daily before lunch and supper.   Yes [provider]  escitalopram (LEXAPRO) 10 MG tablet TAKE 1 TABLET AT BEDTIME Patient taking differently: TAKE 5mg  AT BEDTIME 12/03/17  Yes [place], Modena Nunnery, MD  fluconazole (DIFLUCAN) 200-0.9 MG/100ML-% IVPB Inject 100 mLs (200 mg total) into the vein daily. 01/24/18  Yes Stark Klein, MD  fluticasone (FLONASE) 50 MCG/ACT nasal spray USE 2 SPRAYS IN EACH       NOSTRIL DAILY 12/24/17  Yes [place], Modena Nunnery, MD  gabapentin (NEURONTIN) 100 MG capsule  TAKE 1 TO 2 CAPSULES AT    BEDTIME FOR NEUROPATHY Patient taking differently: Take 100 mg by mouth at bedtime.  12/03/17  Yes [place], Modena Nunnery, MD  Insulin Glargine (LANTUS SOLOSTAR) 100 UNIT/ML Solostar Pen Inject 28 Units into the skin daily at 10 pm.   Yes [provider]  latanoprost (XALATAN) 0.005 % ophthalmic solution Place 1 drop into both eyes at bedtime. 10/01/17  Yes [place], Modena Nunnery, MD  losartan-hydrochlorothiazide (HYZAAR) 50-12.5 MG tablet Take 1 tablet daily by mouth. 10/25/17  Yes [place], Modena Nunnery, MD  magnesium oxide (MAG-OX) 400 MG tablet Take 400 mg by mouth 2 (two) times daily.   Yes [provider]  metoCLOPramide (REGLAN) 5 MG tablet Take 5 mg by mouth every 8 (eight) hours as needed for nausea.   Yes [provider]  mirtazapine (REMERON) 15 MG tablet Take 0.5 tablets (7.5 mg total) by mouth at bedtime. For appetite 10/04/17  Yes , Modena Nunnery, MD  Multiple Vitamins-Minerals (CENTRUM SILVER PO) Take 1 tablet by mouth daily.    Yes [provider]  octreotide (SANDOSTATIN) 50 MCG/ML SOLN injection Inject 1 mL (50 mcg total) into the skin every 12 (twelve) hours. 01/25/18  Yes Stark Klein, MD  ondansetron (ZOFRAN-ODT) 4 MG disintegrating tablet Take 1 tablet (4 mg total) by mouth every 6 (six) hours as needed for nausea. 01/24/18  Yes Stark Klein, MD  oxyCODONE (ROXICODONE) 5 MG/5ML solution Take 5 mLs (5 mg total) by mouth every 6 (six) hours. Patient taking differently: Take 5 mg by mouth every 6 (six) hours as needed for severe pain.  12/13/17  Yes Earnstine Regal, PA-C  pantoprazole (PROTONIX) 40 MG injection Inject 40 mg into the vein at bedtime. 01/24/18  Yes Stark Klein, MD  acetaminophen (TYLENOL) 325 MG tablet Take 2 tablets (650 mg total) by mouth every 6 (six) hours as needed for mild pain (or Fever >/= 101). 12/13/17   Earnstine Regal, PA-C  feeding supplement, ENSURE ENLIVE, (ENSURE ENLIVE) LIQD Take 237 mLs by mouth 3  (three) times daily between meals. 12/13/17   Earnstine Regal, PA-C  magic mouthwash SOLN Take 15 mLs by mouth 3 (three) times daily. 01/24/18   Stark Klein, MD  ONE TOUCH ULTRA TEST test strip TEST FASTING BLOOD SUGAR ONCE DAILY **DX E11.4 09/10/17   Alycia Rossetti, MD     History reviewed. No pertinent family history.  Social History   Socioeconomic History  . Marital status: Widowed    Spouse name: Not on file  . Number of children: Not on file  . Years of education: Not on file  . Highest education level: Not on file  Occupational History  . Not on file  Social Needs  . Financial resource strain: Not on file  . Food insecurity:    Worry: Not on file    Inability: Not  on file  . Transportation needs:    Medical: Not on file    Non-medical: Not on file  Tobacco Use  . Smoking status: Never Smoker  . Smokeless tobacco: Never Used  Substance and Sexual Activity  . Alcohol use: No  . Drug use: No  . Sexual activity: Not Currently  Lifestyle  . Physical activity:    Days per week: Not on file    Minutes per session: Not on file  . Stress: Not on file  Relationships  . Social connections:    Talks on phone: Not on file    Gets together: Not on file    Attends religious service: Not on file    Active member of club or organization: Not on file    Attends meetings of clubs or organizations: Not on file    Relationship status: Not on file  Other Topics Concern  . Not on file  Social History Narrative  . Not on file    Review of Systems: A 12 point ROS discussed and pertinent positives are indicated in the HPI above.  All other systems are negative.  Review of Systems  Constitutional: Negative for fatigue and fever.  Respiratory: Negative for cough and shortness of breath.   Cardiovascular: Negative for chest pain.  Gastrointestinal: Negative for abdominal pain.  Psychiatric/Behavioral: Positive for confusion.    Vital Signs: BP 129/64   Pulse 87   Temp  98.5 F (36.9 C) (Oral)   Resp 16   Ht 5\' 5"  (1.651 m)   Wt 119 lb (54 kg)   SpO2 100%   BMI 19.80 kg/m   Physical Exam  Cardiovascular: Normal rate and regular rhythm.  Pulmonary/Chest: Effort normal and breath sounds normal.  Abdominal: Soft. Bowel sounds are normal.  Musculoskeletal: Normal range of motion.  Neurological: She is alert.  Skin: Skin is warm and dry.  Psychiatric: She has a normal mood and affect.  Pleasantly confused Consent signed by Colen Darling  Nursing note and vitals reviewed.   Imaging: No results found.  Labs:  CBC: Recent Labs    01/29/18 0321 02/04/18 0652 02/08/18 0635 02/14/18 0448  WBC 5.0 7.1 6.3 5.0  HGB 9.2* 9.8* 9.2* 10.2*  HCT 30.0* 31.2* 29.1* 32.6*  PLT 496* 396 328 436*    COAGS: Recent Labs    11/28/17 1220 12/24/17 1301 01/08/18 1452 01/17/18 0718  INR 1.08 1.25 1.19 1.25    BMP: Recent Labs    02/09/18 0628 02/11/18 0826 02/13/18 0516 02/15/18 0601  NA 136 135 132* 137  K 4.1 3.9 4.4 4.1  CL 102 99* 101 104  CO2 22 26 22 25   GLUCOSE 129* 150* 245* 144*  BUN 25* 25* 26* 23*  CALCIUM 8.9 9.1 8.8* 8.6*  CREATININE 0.68 0.69 0.73 0.79  GFRNONAA >60 >60 >60 >60  GFRAA >60 >60 >60 >60    LIVER FUNCTION TESTS: Recent Labs    01/24/18 0327 01/28/18 0748 02/04/18 0652 02/11/18 0826  BILITOT 0.4 0.5 0.6 0.3  AST 27 32 32 26  ALT 24 31 39 31  ALKPHOS 159* 176* 185* 170*  PROT 5.5* 5.6* 5.7* 6.5  ALBUMIN 1.9* 2.1* 2.2* 2.5*    TUMOR MARKERS: Recent Labs    11/28/17 1220  [place] 7*    Assessment and Plan:  PCM Dysphagia; FTT Percutaneous gastric tube placement Risks and benefits discussed with the patient's nephew Broadus John including, but not limited to the need for a barium enema during the procedure,  bleeding, infection, peritonitis, or damage to adjacent structures.  All of the nephews questions were answered, he is agreeable to proceed. Consent signed and in chart.   Thank you for  this interesting consult.  I greatly enjoyed meeting Theresa Howe and look forward to participating in their care.  A copy of this report was sent to the requesting provider on this date.  Electronically Signed: Lavonia Drafts, PA-C 04/11/2018, 8:09 AM   I spent a total of  30 Minutes   in face to face in clinical consultation, greater than 50% of which was counseling/coordinating care for percutaneous gastric tube placement

## 2018-04-11 NOTE — Discharge Instructions (Addendum)
Gastrostomy Tube Home Guide, Adult A gastrostomy tube is a tube that is surgically placed into the stomach. It is also called a G-tube. G-tubes are used when a person is unable to eat and drink enough on their own to stay healthy. The tube is inserted into the stomach through a small cut (incision) in the skin. This tube is used for:  Feeding.  Giving medication.  Gastrostomy tube care  Wash your hands with soap and water.  Remove the old dressing (if any). Some styles of G-tubes may need a dressing inserted between the skin and the G-tube. Other types of G-tubes do not require a dressing. Ask your health care provider if a dressing is needed.  Check the area where the tube enters the skin (insertion site) for redness, swelling, or pus-like (purulent) drainage. A small amount of clear or tan liquid drainage is normal. Check to make sure scar tissue (skin) is not growing around the insertion site. This could have a raised, bumpy appearance.  A cotton swab can be used to clean the skin around the tube: ? When the G-tube is first put in, a normal saline solution or water can be used to clean the skin. ? Mild soap and warm water can be used when the skin around the G-tube site has healed. ? Roll the cotton swab around the G-tube insertion site to remove any drainage or crusting at the insertion site. Stomach residuals Feeding tube residuals are the amount of liquids that are in the stomach at any given time. Residuals may be checked before giving feedings, medications, or as instructed by your health care provider.  Ask your health care provider if there are instances when you would not start tube feedings depending on the amount or type of contents withdrawn from the stomach.  Check residuals by attaching a syringe to the G-tube and pulling back on the syringe plunger. Note the amount, and return the residual back into the stomach.  Flushing the G-tube  The G-tube should be periodically  flushed with clean warm water to keep it from clogging. ? Flush the G-tube after feedings or medications. Draw up 30 mL of warm water in a syringe. Connect the syringe to the G-tube and slowly push the water into the tube. ? Do not push feedings, medications, or flushes rapidly. Flush the G-tube gently and slowly. ? Only use syringes made for G-tubes to flush medications or feedings. ? Your health care provider may want the G-tube flushed more often or with more water. If this is the case, follow your health care provider's instructions. Feedings Your health care provider will determine whether feedings are given as a bolus (a certain amount given at one time and at scheduled times) or whether feedings will be given continuously on a feeding pump.  Formulas should be given at room temperature.  If feedings are continuous, no more than 4 hours worth of feedings should be placed in the feeding bag. This helps prevent spoilage or accidental excess infusion.  Cover and place unused formula in the refrigerator.  If feedings are continuous, stop the feedings when medications or flushes are given. Be sure to restart the feedings.  Feeding bags and syringes should be replaced as instructed by your health care provider.  Giving medication  In general, it is best if all medications are in a liquid form for G-tube administration. Liquid medications are less likely to clog the G-tube. ? Mix the liquid medication with 30 mL (or amount recommended  by your health care provider) of warm water. ? Draw up the medication into the syringe. ? Attach the syringe to the G-tube and slowly push the mixture into the G-tube. ? After giving the medication, draw up 30 mL of warm water in the syringe and slowly flush the G-tube.  For pills or capsules, check with your health care provider first before crushing medications. Some pills are not effective if they are crushed. Some capsules are sustained-release  medications. ? If appropriate, crush the pill or capsule and mix with 30 mL of warm water. Using the syringe, slowly push the medication through the tube, then flush the tube with another 30 mL of tap water. G-tube problems G-tube was pulled out.  Cause: May have been pulled out accidentally.  Solutions: Cover the opening with clean dressing and tape. Call your health care provider right away. The G-tube should be put in as soon as possible (within 4 hours) so the G-tube opening (tract) does not close. The G-tube needs to be put in at a health care setting. An X-ray needs to be done to confirm placement before the G-tube can be used again.  Redness, irritation, soreness, or foul odor around the gastrostomy site.  Cause: May be caused by leakage or infection.  Solutions: Call your health care provider right away.  Large amount of leakage of fluid or mucus-like liquid present (a large amount means it soaks clothing).  Cause: Many reasons could cause the G-tube to leak.  Solutions: Call your health care provider to discuss the amount of leakage.  Skin or scar tissue appears to be growing where tube enters skin.  Cause: Tissue growth may develop around the insertion site if the G-tube is moved or pulled on excessively.  Solutions: Secure tube with tape so that excess movement does not occur. Call your health care provider.  G-tube is clogged.  Cause: Thick formula or medication.  Solutions: Try to slowly push warm water into the tube with a large syringe. Never try to push any object into the tube to unclog it. Do not force fluid into the G-tube. If you are unable to unclog the tube, call your health care provider right away.  Tips  Head of bed (HOB) position refers to the upright position of a person's upper body. ? When giving medications or a feeding bolus, keep the [facility] up as told by your health care provider. Do this during the feeding and for 1 hour after the feeding or  medication administration. ? If continuous feedings are being given, it is best to keep the [facility] up as told by your health care provider. When ADLs (activities of daily living) are performed and the [facility] needs to be flat, be sure to turn the feeding pump off. Restart the feeding pump when the [facility] is returned to the recommended height.  Do not pull or put tension on the tube.  To prevent fluid backflow, kink the G-tube before removing the cap or disconnecting a syringe.  Check the G-tube length every day. Measure from the insertion site to the end of the G-tube. If the length is longer than previous measurements, the tube may be coming out. Call your health care provider if you notice increasing G-tube length.  Oral care, such as brushing teeth, must be continued.  You may need to remove excess air (vent) from the G-tube. Your health care provider will tell you if this is needed.  Always call your health care provider if you have  questions or problems with the G-tube. °Get help right away if: °· You have severe abdominal pain, tenderness, or abdominal bloating (distension). °· You have nausea or vomiting. °· You are constipated or have problems moving your bowels. °· The G-tube insertion site is red, swollen, has a foul smell, or has yellow or brown drainage. °· You have difficulty breathing or shortness of breath. °· You have a fever. °· You have a large amount of feeding tube residuals. °· The G-tube is clogged and cannot be flushed. °This information is not intended to replace advice given to you by your health care provider. Make sure you discuss any questions you have with your health care provider. °Document Released: 02/12/2002 Document Revised: 05/11/2016 Document Reviewed: 08/11/2013 °Elsevier Interactive Patient Education © 2017 [employer] ° °Moderate Conscious Sedation, Adult, Care After °These instructions provide you with information about caring for yourself after your procedure. Your  health care provider may also give you more specific instructions. Your treatment has been planned according to current medical practices, but problems sometimes occur. Call your health care provider if you have any problems or questions after your procedure. °What can I expect after the procedure? °After your procedure, it is common: °· To feel sleepy for several hours. °· To feel clumsy and have poor balance for several hours. °· To have poor judgment for several hours. °· To vomit if you eat too soon. ° °Follow these instructions at home: °For at least 24 hours after the procedure: ° °· Do not: °? Participate in activities where you could fall or become injured. °? Drive. °? Use heavy machinery. °? Drink alcohol. °? Take sleeping pills or medicines that cause drowsiness. °? Make important decisions or sign legal documents. °? Take care of children on your own. °· Rest. °Eating and drinking °· Follow the diet recommended by your health care provider. °· If you vomit: °? Drink water, juice, or soup when you can drink without vomiting. °? Make sure you have little or no nausea before eating solid foods. °General instructions °· Have a responsible adult stay with you until you are awake and alert. °· Take over-the-counter and prescription medicines only as told by your health care provider. °· If you smoke, do not smoke without supervision. °· Keep all follow-up visits as told by your health care provider. This is important. °Contact a health care provider if: °· You keep feeling nauseous or you keep vomiting. °· You feel light-headed. °· You develop a rash. °· You have a fever. °Get help right away if: °· You have trouble breathing. °This information is not intended to replace advice given to you by your health care provider. Make sure you discuss any questions you have with your health care provider. °Document Released: 09/24/2013 Document Revised: 05/08/2016 Document Reviewed: 03/25/2016 °Elsevier Interactive  Patient Education © 2018 [employer]. ° °

## 2018-04-12 DIAGNOSIS — I10 Essential (primary) hypertension: Secondary | ICD-10-CM | POA: Diagnosis not present

## 2018-04-12 DIAGNOSIS — F339 Major depressive disorder, recurrent, unspecified: Secondary | ICD-10-CM | POA: Diagnosis not present

## 2018-04-12 DIAGNOSIS — E43 Unspecified severe protein-calorie malnutrition: Secondary | ICD-10-CM | POA: Diagnosis not present

## 2018-04-12 DIAGNOSIS — M6281 Muscle weakness (generalized): Secondary | ICD-10-CM | POA: Diagnosis not present

## 2018-04-12 DIAGNOSIS — Z9049 Acquired absence of other specified parts of digestive tract: Secondary | ICD-10-CM | POA: Diagnosis not present

## 2018-04-15 DIAGNOSIS — R5381 Other malaise: Secondary | ICD-10-CM | POA: Diagnosis not present

## 2018-04-15 DIAGNOSIS — Z9049 Acquired absence of other specified parts of digestive tract: Secondary | ICD-10-CM | POA: Diagnosis not present

## 2018-04-15 DIAGNOSIS — E43 Unspecified severe protein-calorie malnutrition: Secondary | ICD-10-CM | POA: Diagnosis not present

## 2018-04-15 DIAGNOSIS — R627 Adult failure to thrive: Secondary | ICD-10-CM | POA: Diagnosis not present

## 2018-04-15 DIAGNOSIS — R112 Nausea with vomiting, unspecified: Secondary | ICD-10-CM | POA: Diagnosis not present

## 2018-04-16 DIAGNOSIS — Z9049 Acquired absence of other specified parts of digestive tract: Secondary | ICD-10-CM | POA: Diagnosis not present

## 2018-04-16 DIAGNOSIS — E1140 Type 2 diabetes mellitus with diabetic neuropathy, unspecified: Secondary | ICD-10-CM | POA: Diagnosis not present

## 2018-04-16 DIAGNOSIS — R739 Hyperglycemia, unspecified: Secondary | ICD-10-CM | POA: Diagnosis not present

## 2018-05-03 DIAGNOSIS — R1312 Dysphagia, oropharyngeal phase: Secondary | ICD-10-CM | POA: Diagnosis not present

## 2018-05-03 DIAGNOSIS — I10 Essential (primary) hypertension: Secondary | ICD-10-CM | POA: Diagnosis not present

## 2018-05-03 DIAGNOSIS — R627 Adult failure to thrive: Secondary | ICD-10-CM | POA: Diagnosis not present

## 2018-05-03 DIAGNOSIS — Z9049 Acquired absence of other specified parts of digestive tract: Secondary | ICD-10-CM | POA: Diagnosis not present

## 2018-05-03 DIAGNOSIS — E1140 Type 2 diabetes mellitus with diabetic neuropathy, unspecified: Secondary | ICD-10-CM | POA: Diagnosis not present

## 2018-06-04 DIAGNOSIS — E43 Unspecified severe protein-calorie malnutrition: Secondary | ICD-10-CM | POA: Diagnosis not present

## 2018-06-04 DIAGNOSIS — Z9049 Acquired absence of other specified parts of digestive tract: Secondary | ICD-10-CM | POA: Diagnosis not present

## 2018-06-04 DIAGNOSIS — R630 Anorexia: Secondary | ICD-10-CM | POA: Diagnosis not present

## 2018-06-04 DIAGNOSIS — R1312 Dysphagia, oropharyngeal phase: Secondary | ICD-10-CM | POA: Diagnosis not present

## 2018-06-05 DIAGNOSIS — R1312 Dysphagia, oropharyngeal phase: Secondary | ICD-10-CM | POA: Diagnosis not present

## 2018-06-05 DIAGNOSIS — N390 Urinary tract infection, site not specified: Secondary | ICD-10-CM | POA: Diagnosis not present

## 2018-06-05 DIAGNOSIS — E43 Unspecified severe protein-calorie malnutrition: Secondary | ICD-10-CM | POA: Diagnosis not present

## 2018-06-05 DIAGNOSIS — Z139 Encounter for screening, unspecified: Secondary | ICD-10-CM | POA: Diagnosis not present

## 2018-06-05 DIAGNOSIS — Z9049 Acquired absence of other specified parts of digestive tract: Secondary | ICD-10-CM | POA: Diagnosis not present

## 2018-06-07 DIAGNOSIS — E43 Unspecified severe protein-calorie malnutrition: Secondary | ICD-10-CM | POA: Diagnosis not present

## 2018-06-07 DIAGNOSIS — R1312 Dysphagia, oropharyngeal phase: Secondary | ICD-10-CM | POA: Diagnosis not present

## 2018-06-07 DIAGNOSIS — Z9049 Acquired absence of other specified parts of digestive tract: Secondary | ICD-10-CM | POA: Diagnosis not present

## 2018-06-08 DIAGNOSIS — Z9049 Acquired absence of other specified parts of digestive tract: Secondary | ICD-10-CM | POA: Diagnosis not present

## 2018-06-08 DIAGNOSIS — R1312 Dysphagia, oropharyngeal phase: Secondary | ICD-10-CM | POA: Diagnosis not present

## 2018-06-08 DIAGNOSIS — E43 Unspecified severe protein-calorie malnutrition: Secondary | ICD-10-CM | POA: Diagnosis not present

## 2018-06-10 DIAGNOSIS — R1312 Dysphagia, oropharyngeal phase: Secondary | ICD-10-CM | POA: Diagnosis not present

## 2018-06-10 DIAGNOSIS — E43 Unspecified severe protein-calorie malnutrition: Secondary | ICD-10-CM | POA: Diagnosis not present

## 2018-06-10 DIAGNOSIS — Z9049 Acquired absence of other specified parts of digestive tract: Secondary | ICD-10-CM | POA: Diagnosis not present

## 2018-06-11 DIAGNOSIS — R1312 Dysphagia, oropharyngeal phase: Secondary | ICD-10-CM | POA: Diagnosis not present

## 2018-06-11 DIAGNOSIS — E43 Unspecified severe protein-calorie malnutrition: Secondary | ICD-10-CM | POA: Diagnosis not present

## 2018-06-11 DIAGNOSIS — Z9049 Acquired absence of other specified parts of digestive tract: Secondary | ICD-10-CM | POA: Diagnosis not present

## 2018-06-12 DIAGNOSIS — R1312 Dysphagia, oropharyngeal phase: Secondary | ICD-10-CM | POA: Diagnosis not present

## 2018-06-12 DIAGNOSIS — Z9049 Acquired absence of other specified parts of digestive tract: Secondary | ICD-10-CM | POA: Diagnosis not present

## 2018-06-12 DIAGNOSIS — E43 Unspecified severe protein-calorie malnutrition: Secondary | ICD-10-CM | POA: Diagnosis not present

## 2018-06-13 DIAGNOSIS — E43 Unspecified severe protein-calorie malnutrition: Secondary | ICD-10-CM | POA: Diagnosis not present

## 2018-06-13 DIAGNOSIS — R1312 Dysphagia, oropharyngeal phase: Secondary | ICD-10-CM | POA: Diagnosis not present

## 2018-06-13 DIAGNOSIS — Z9049 Acquired absence of other specified parts of digestive tract: Secondary | ICD-10-CM | POA: Diagnosis not present

## 2018-06-14 DIAGNOSIS — Z9049 Acquired absence of other specified parts of digestive tract: Secondary | ICD-10-CM | POA: Diagnosis not present

## 2018-06-14 DIAGNOSIS — R1312 Dysphagia, oropharyngeal phase: Secondary | ICD-10-CM | POA: Diagnosis not present

## 2018-06-14 DIAGNOSIS — E43 Unspecified severe protein-calorie malnutrition: Secondary | ICD-10-CM | POA: Diagnosis not present

## 2018-06-17 DIAGNOSIS — R1312 Dysphagia, oropharyngeal phase: Secondary | ICD-10-CM | POA: Diagnosis not present

## 2018-06-17 DIAGNOSIS — F05 Delirium due to known physiological condition: Secondary | ICD-10-CM | POA: Diagnosis not present

## 2018-06-17 DIAGNOSIS — Z139 Encounter for screening, unspecified: Secondary | ICD-10-CM | POA: Diagnosis not present

## 2018-06-17 DIAGNOSIS — E43 Unspecified severe protein-calorie malnutrition: Secondary | ICD-10-CM | POA: Diagnosis not present

## 2018-06-17 DIAGNOSIS — R627 Adult failure to thrive: Secondary | ICD-10-CM | POA: Diagnosis not present

## 2018-06-17 DIAGNOSIS — Z79899 Other long term (current) drug therapy: Secondary | ICD-10-CM | POA: Diagnosis not present

## 2018-06-17 DIAGNOSIS — R451 Restlessness and agitation: Secondary | ICD-10-CM | POA: Diagnosis not present

## 2018-06-17 DIAGNOSIS — R630 Anorexia: Secondary | ICD-10-CM | POA: Diagnosis not present

## 2018-06-17 DIAGNOSIS — Z9049 Acquired absence of other specified parts of digestive tract: Secondary | ICD-10-CM | POA: Diagnosis not present

## 2018-06-17 DIAGNOSIS — D649 Anemia, unspecified: Secondary | ICD-10-CM | POA: Diagnosis not present

## 2018-06-17 DIAGNOSIS — N390 Urinary tract infection, site not specified: Secondary | ICD-10-CM | POA: Diagnosis not present

## 2018-06-17 DIAGNOSIS — R5383 Other fatigue: Secondary | ICD-10-CM | POA: Diagnosis not present

## 2018-06-18 ENCOUNTER — Other Ambulatory Visit: Payer: Self-pay | Admitting: Internal Medicine

## 2018-06-18 DIAGNOSIS — Z789 Other specified health status: Secondary | ICD-10-CM

## 2018-06-18 DIAGNOSIS — Z9049 Acquired absence of other specified parts of digestive tract: Secondary | ICD-10-CM | POA: Diagnosis not present

## 2018-06-18 DIAGNOSIS — E43 Unspecified severe protein-calorie malnutrition: Secondary | ICD-10-CM | POA: Diagnosis not present

## 2018-06-18 DIAGNOSIS — R1312 Dysphagia, oropharyngeal phase: Secondary | ICD-10-CM | POA: Diagnosis not present

## 2018-06-19 ENCOUNTER — Encounter (HOSPITAL_COMMUNITY): Payer: Self-pay | Admitting: Interventional Radiology

## 2018-06-19 ENCOUNTER — Ambulatory Visit (HOSPITAL_COMMUNITY)
Admission: RE | Admit: 2018-06-19 | Discharge: 2018-06-19 | Disposition: A | Discharge status: Home / Self Care | Payer: Medicare Other | Source: Ambulatory Visit | Attending: Internal Medicine | Admitting: Internal Medicine

## 2018-06-19 DIAGNOSIS — Z9049 Acquired absence of other specified parts of digestive tract: Secondary | ICD-10-CM | POA: Diagnosis not present

## 2018-06-19 DIAGNOSIS — Z789 Other specified health status: Secondary | ICD-10-CM

## 2018-06-19 DIAGNOSIS — Z452 Encounter for adjustment and management of vascular access device: Secondary | ICD-10-CM | POA: Diagnosis not present

## 2018-06-19 DIAGNOSIS — E43 Unspecified severe protein-calorie malnutrition: Secondary | ICD-10-CM | POA: Diagnosis not present

## 2018-06-19 DIAGNOSIS — R1312 Dysphagia, oropharyngeal phase: Secondary | ICD-10-CM | POA: Diagnosis not present

## 2018-06-19 HISTORY — PX: IR REMOVAL TUN CV CATH W/O FL: IMG2289

## 2018-06-19 MED ORDER — LIDOCAINE HCL 1 % IJ SOLN
INTRAMUSCULAR | Status: AC
  Filled 2018-06-19: qty 20

## 2018-06-20 DIAGNOSIS — Z9049 Acquired absence of other specified parts of digestive tract: Secondary | ICD-10-CM | POA: Diagnosis not present

## 2018-06-20 DIAGNOSIS — R1312 Dysphagia, oropharyngeal phase: Secondary | ICD-10-CM | POA: Diagnosis not present

## 2018-06-20 DIAGNOSIS — E43 Unspecified severe protein-calorie malnutrition: Secondary | ICD-10-CM | POA: Diagnosis not present

## 2018-06-21 DIAGNOSIS — E43 Unspecified severe protein-calorie malnutrition: Secondary | ICD-10-CM | POA: Diagnosis not present

## 2018-06-21 DIAGNOSIS — R1312 Dysphagia, oropharyngeal phase: Secondary | ICD-10-CM | POA: Diagnosis not present

## 2018-06-21 DIAGNOSIS — Z9049 Acquired absence of other specified parts of digestive tract: Secondary | ICD-10-CM | POA: Diagnosis not present

## 2018-06-24 DIAGNOSIS — E43 Unspecified severe protein-calorie malnutrition: Secondary | ICD-10-CM | POA: Diagnosis not present

## 2018-06-24 DIAGNOSIS — R1312 Dysphagia, oropharyngeal phase: Secondary | ICD-10-CM | POA: Diagnosis not present

## 2018-06-24 DIAGNOSIS — Z9049 Acquired absence of other specified parts of digestive tract: Secondary | ICD-10-CM | POA: Diagnosis not present

## 2018-06-25 DIAGNOSIS — E43 Unspecified severe protein-calorie malnutrition: Secondary | ICD-10-CM | POA: Diagnosis not present

## 2018-06-25 DIAGNOSIS — R1312 Dysphagia, oropharyngeal phase: Secondary | ICD-10-CM | POA: Diagnosis not present

## 2018-06-25 DIAGNOSIS — Z9049 Acquired absence of other specified parts of digestive tract: Secondary | ICD-10-CM | POA: Diagnosis not present

## 2018-06-28 DIAGNOSIS — Z9049 Acquired absence of other specified parts of digestive tract: Secondary | ICD-10-CM | POA: Diagnosis not present

## 2018-06-28 DIAGNOSIS — Z931 Gastrostomy status: Secondary | ICD-10-CM | POA: Diagnosis not present

## 2018-06-28 DIAGNOSIS — E43 Unspecified severe protein-calorie malnutrition: Secondary | ICD-10-CM | POA: Diagnosis not present

## 2018-06-28 DIAGNOSIS — F0390 Unspecified dementia without behavioral disturbance: Secondary | ICD-10-CM | POA: Diagnosis not present

## 2018-06-28 DIAGNOSIS — Z681 Body mass index (BMI) 19 or less, adult: Secondary | ICD-10-CM | POA: Diagnosis not present

## 2018-06-28 DIAGNOSIS — R627 Adult failure to thrive: Secondary | ICD-10-CM | POA: Diagnosis not present

## 2018-06-28 DIAGNOSIS — E1165 Type 2 diabetes mellitus with hyperglycemia: Secondary | ICD-10-CM | POA: Diagnosis not present

## 2018-07-12 DIAGNOSIS — D649 Anemia, unspecified: Secondary | ICD-10-CM | POA: Diagnosis not present

## 2018-07-12 DIAGNOSIS — Z5181 Encounter for therapeutic drug level monitoring: Secondary | ICD-10-CM | POA: Diagnosis not present

## 2018-07-12 DIAGNOSIS — E119 Type 2 diabetes mellitus without complications: Secondary | ICD-10-CM | POA: Diagnosis not present

## 2018-07-12 DIAGNOSIS — Z139 Encounter for screening, unspecified: Secondary | ICD-10-CM | POA: Diagnosis not present

## 2018-07-12 DIAGNOSIS — R799 Abnormal finding of blood chemistry, unspecified: Secondary | ICD-10-CM | POA: Diagnosis not present

## 2018-07-16 DIAGNOSIS — E162 Hypoglycemia, unspecified: Secondary | ICD-10-CM | POA: Diagnosis not present

## 2018-07-16 DIAGNOSIS — E43 Unspecified severe protein-calorie malnutrition: Secondary | ICD-10-CM | POA: Diagnosis not present

## 2018-07-16 DIAGNOSIS — R630 Anorexia: Secondary | ICD-10-CM | POA: Diagnosis not present

## 2018-07-16 DIAGNOSIS — R627 Adult failure to thrive: Secondary | ICD-10-CM | POA: Diagnosis not present

## 2018-07-16 DIAGNOSIS — F0390 Unspecified dementia without behavioral disturbance: Secondary | ICD-10-CM | POA: Diagnosis not present

## 2018-07-23 DIAGNOSIS — E162 Hypoglycemia, unspecified: Secondary | ICD-10-CM | POA: Diagnosis not present

## 2018-07-23 DIAGNOSIS — E1140 Type 2 diabetes mellitus with diabetic neuropathy, unspecified: Secondary | ICD-10-CM | POA: Diagnosis not present

## 2018-07-23 DIAGNOSIS — R630 Anorexia: Secondary | ICD-10-CM | POA: Diagnosis not present

## 2018-07-23 DIAGNOSIS — E43 Unspecified severe protein-calorie malnutrition: Secondary | ICD-10-CM | POA: Diagnosis not present

## 2018-07-23 DIAGNOSIS — F0390 Unspecified dementia without behavioral disturbance: Secondary | ICD-10-CM | POA: Diagnosis not present

## 2018-08-02 DIAGNOSIS — B351 Tinea unguium: Secondary | ICD-10-CM | POA: Diagnosis not present

## 2018-08-02 DIAGNOSIS — E1151 Type 2 diabetes mellitus with diabetic peripheral angiopathy without gangrene: Secondary | ICD-10-CM | POA: Diagnosis not present

## 2018-08-05 DIAGNOSIS — E162 Hypoglycemia, unspecified: Secondary | ICD-10-CM | POA: Diagnosis not present

## 2018-08-05 DIAGNOSIS — E43 Unspecified severe protein-calorie malnutrition: Secondary | ICD-10-CM | POA: Diagnosis not present

## 2018-08-05 DIAGNOSIS — F0390 Unspecified dementia without behavioral disturbance: Secondary | ICD-10-CM | POA: Diagnosis not present

## 2018-08-05 DIAGNOSIS — E1151 Type 2 diabetes mellitus with diabetic peripheral angiopathy without gangrene: Secondary | ICD-10-CM | POA: Diagnosis not present

## 2018-08-27 DIAGNOSIS — Z9149 Other personal history of psychological trauma, not elsewhere classified: Secondary | ICD-10-CM | POA: Diagnosis not present

## 2018-08-27 DIAGNOSIS — R627 Adult failure to thrive: Secondary | ICD-10-CM | POA: Diagnosis not present

## 2018-08-27 DIAGNOSIS — M6281 Muscle weakness (generalized): Secondary | ICD-10-CM | POA: Diagnosis not present

## 2018-08-27 DIAGNOSIS — E43 Unspecified severe protein-calorie malnutrition: Secondary | ICD-10-CM | POA: Diagnosis not present

## 2018-08-27 DIAGNOSIS — E1140 Type 2 diabetes mellitus with diabetic neuropathy, unspecified: Secondary | ICD-10-CM | POA: Diagnosis not present

## 2018-09-03 ENCOUNTER — Other Ambulatory Visit: Payer: Self-pay

## 2018-09-03 ENCOUNTER — Encounter: Payer: Self-pay | Admitting: Family Medicine

## 2018-09-03 ENCOUNTER — Ambulatory Visit (INDEPENDENT_AMBULATORY_CARE_PROVIDER_SITE_OTHER): Payer: Medicare Other | Admitting: Family Medicine

## 2018-09-03 VITALS — BP 110/62 | HR 58 | Temp 98.6°F | Resp 14 | Ht 63.0 in | Wt 92.0 lb

## 2018-09-03 DIAGNOSIS — E1143 Type 2 diabetes mellitus with diabetic autonomic (poly)neuropathy: Secondary | ICD-10-CM | POA: Diagnosis not present

## 2018-09-03 DIAGNOSIS — E43 Unspecified severe protein-calorie malnutrition: Secondary | ICD-10-CM

## 2018-09-03 DIAGNOSIS — Z7189 Other specified counseling: Secondary | ICD-10-CM

## 2018-09-03 DIAGNOSIS — K909 Intestinal malabsorption, unspecified: Secondary | ICD-10-CM

## 2018-09-03 DIAGNOSIS — R2681 Unsteadiness on feet: Secondary | ICD-10-CM

## 2018-09-03 DIAGNOSIS — I10 Essential (primary) hypertension: Secondary | ICD-10-CM

## 2018-09-03 DIAGNOSIS — R197 Diarrhea, unspecified: Secondary | ICD-10-CM

## 2018-09-03 DIAGNOSIS — Z7689 Persons encountering health services in other specified circumstances: Secondary | ICD-10-CM

## 2018-09-03 DIAGNOSIS — R634 Abnormal weight loss: Secondary | ICD-10-CM

## 2018-09-03 MED ORDER — BLOOD GLUCOSE TEST VI STRP: ORAL_STRIP | 11 refills | Status: DC

## 2018-09-03 MED ORDER — BLOOD GLUCOSE SYSTEM PAK KIT: PACK | 0 refills | Status: DC

## 2018-09-03 MED ORDER — LANCETS MISC: 11 refills | Status: DC

## 2018-09-03 NOTE — Patient Instructions (Addendum)
Repeat labs  Get you new meter

## 2018-09-03 NOTE — Progress Notes (Signed)
Patient ID: Theresa Howe, female    DOB: Apr 24, 1931, 84 y.o.   MRN: [id number]  PCP: [name], MD  Chief Complaint  Patient presents with  . SNF F/U    released from ALF and needs to F/U    Subjective:   Theresa Howe is a 84 y.o. female, presents for follow up after months of SNF care following bowel obstruction, multiple bowel resections of carcinoid tumor, now has a G tube with tube feeds, is only on insulin and last week moved in with her son and daughter in law.  They were not able to afford continued SNF care, and she did not qualify for medicaid due to income.  They have arranged for her stay with them and have a CNA/sitter during the daytime hours while they are at work.  Her son and DIL care for her at night.   Pt has lost a lot of weight since earlier this year.  She is still able to get around independently but is weak and sometimes needs something to grab onto.  Her DIL is with her today.  The pt states she can even go up and down the stairs by herself, but DIL has tried to be with her when she does.   They have been attempting to do tube feeds as instructed she is also able to eat and drink.  They were told to do Glucerna 4 times a day in the whole can followed by a 200 mL flush of water.  As soon as they do the Glucerna she has profuse uncontrollable watery diarrhea.  They have decreased the volume of Glucerna to smaller amounts and scattered them throughout the day every couple hours, this is helped with the loose stools a little bit but it does still feels like it "goes straight through her".  Currently they believe they are only able to give her a total of 2.5 to 3 cans a day. They were sent home with the Levemir insulin pen and instructed to do 18 to 16 units after checking her sugar at night.  Her glucometer was misplaced or lost and they did not give it to her when she checked out of the nursing facility.  Daughter-in-law has held the insulin because she is  afraid to give it when she does not know what the sugars are.  She is not currently on any other diabetes medication.  Her lab reports she also had some difficulty taking out of the facility because not have all her medications, the nursing home said did not have him, and pharmacy did not have refills or a return supply.   Insulin has been held for the past 5 days. No records from SNF. Patient is taking Eliquis 2.5, Tylenol as needed, vitamin D supplementation, Zofran as needed, Protonix, other medications at this time. Patient nor the daughter-in-law does not know if there is any future follow-ups with a surgeon to recheck her tube.  They are putting gauze around the site, there has not been any redness, swelling, pain or drainage.  Patient is tolerating with feeds they are able to do right now without any increased abdominal pain, bloating, vomiting.  Patient has no other complaints. The history is limited by historian and by the daughter-in-law and records for the past several months or not available during visit.  Patient denies any episodes of feeling high or low sugar, denies any jitteriness, confusion, altered mental status, sweats, agitation, syncope, urinary frequency, nocturia, polyuria,  polydipsia.  States that besides the diarrhea from tube feeds she is having normal urination she denies any other abdominal pain, back pain, chest pain.   Wt Readings from Last 5 Encounters:  09/03/18 92 lb (41.7 kg)  04/11/18 119 lb (54 kg)  01/24/18 119 lb 7.8 oz (54.2 kg)  12/10/17 127 lb (57.6 kg)  11/28/17 106 lb 3.2 oz (48.2 kg)   Home health is currently set up to come into the home.   Discussed goals of care with patient and daughter-in-law and they do have difficulty telling me the exact diagnosis and prognosis of cancer.  They are concerned with her weight loss, particularly weight loss that occurred while at a nursing facility.  Her main goals are to have patient maintain her nutrition,  gained some weight back and gain some strength.   Unclear if there is follow-up for oncology or surgery.   Patient is new to me, her PCP is out on maternity leave. Reviewing her records I have in epic PCP, Dr. Jeanice Lim started work-up for cancer October 2018 after being seen for weight loss for several months with CT evidence of a tumor in her small intestines concerning for carcinoid tumor.  She was referred to GI and surgery, had diagnostic leprous copy and small bowel resection with diagnosis of malignant small bowel mass and mesenteric mass.  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  She was taken to surgery a few days later with partial small bowel obstruction, diagnostic and expiratory laparotomy with a repeated small bowel resection because of these are obstructing segment of bowel, falling second surgery was on TPN via PICC line and had further postop complications with new dysphasia delirium.   There is not a lot of documentation available between January and April but in April she had a percutaneous G-tube placed and family reports that she has been in skilled nursing facility since then. Previously had referrals to medical oncology, palliative.  Going home believe that do have orders for home health and unclear if the other services  Patient Active Problem List   Diagnosis Date Noted  . Advance care planning   . On total parenteral nutrition (TPN)   . Dysphagia   . Intra-abdominal abscess (HCC)   . Oral thrush   . Palliative care by specialist   . Goals of care, counseling/discussion   . Carcinomatosis (HCC) 01/17/2018  . Failure to thrive (0-17) 12/24/2017  . Protein-calorie malnutrition, severe 12/06/2017  . Small bowel mass  12/04/2017  . Protein-calorie malnutrition (HCC) 09/11/2017  . Weight loss 09/11/2017  . Diabetic neuropathy (HCC) 09/28/2015  . Hip pain 09/21/2014  . Muscle ache of extremity 06/12/2014  . Anxiety state 10/06/2013  . Bladder prolapse, female, acquired 08/03/2013  . Osteoporosis, unspecified 08/03/2013  . Nonspecific (abnormal) findings on radiological and other examination of biliary tract 04/17/2013  . Diabetes with neurologic complications (HCC) 09/18/2011  . HTN (hypertension) 09/18/2011  . Hyperlipidemia LDL goal <70 09/18/2011  . Glaucoma 09/18/2011     Prior to Admission medications   Medication Sig Start Date End Date Taking? Authorizing Provider  acetaminophen (TYLENOL) 325 MG tablet Take 650 mg by mouth every 6 (six) hours as needed.   Yes [provider]  apixaban (ELIQUIS) 2.5 MG TABS  tablet Take 2.5 mg by mouth 2 (two) times daily.   Yes [provider]  CAPSAICIN EX Apply topically.   Yes [provider]  cholecalciferol (VITAMIN D) 1000 units tablet Take 1,000 Units by mouth daily.   Yes [provider]  insulin detemir (LEVEMIR) 100 UNIT/ML injection Inject 18 Units into the skin daily.    Yes [provider]  ondansetron (ZOFRAN) 4 MG tablet Take 4 mg by mouth every 8 (eight) hours as needed for nausea or vomiting.   Yes [provider]  pantoprazole (PROTONIX) 40 MG tablet Take 40 mg by mouth daily.   Yes [provider]     No Known Allergies   History reviewed. No pertinent family history.   Social History   Socioeconomic History  . Marital status: Widowed    Spouse name: Not on file  . Number of children: Not on file  . Years of education: Not on file  . Highest education level: Not on file  Occupational History  . Not on file  Social Needs  . Financial resource strain: Not on file  . Food insecurity:    Worry: Not on file    Inability: Not on file  . Transportation needs:     Medical: Not on file    Non-medical: Not on file  Tobacco Use  . Smoking status: Never Smoker  . Smokeless tobacco: Never Used  Substance and Sexual Activity  . Alcohol use: No  . Drug use: No  . Sexual activity: Not Currently  Lifestyle  . Physical activity:    Days per week: Not on file    Minutes per session: Not on file  . Stress: Not on file  Relationships  . Social connections:    Talks on phone: Not on file    Gets together: Not on file    Attends religious service: Not on file    Active member of club or organization: Not on file    Attends meetings of clubs or organizations: Not on file    Relationship status: Not on file  . Intimate partner violence:    Fear of current or ex partner: Not on file    Emotionally abused: Not on file    Physically abused: Not on file    Forced sexual activity: Not on file  Other Topics Concern  . Not on file  Social History Narrative  . Not on file     Review of Systems  Constitutional: Negative.  Negative for activity change, appetite change, chills, diaphoresis, fatigue, fever and unexpected weight change.  HENT: Negative.   Eyes: Negative.   Respiratory: Negative.  Negative for shortness of breath.   Cardiovascular: Negative.  Negative for chest pain, palpitations and leg swelling.  Gastrointestinal: Negative.  Negative for abdominal distention, abdominal pain and blood in stool.  Endocrine: Negative.   Genitourinary: Negative.   Musculoskeletal: Negative.  Negative for arthralgias, gait problem, joint swelling and myalgias.  Skin: Negative.  Negative for color change, pallor, rash and wound.  Allergic/Immunologic: Negative.   Neurological: Negative for syncope.  Hematological: Negative.   Psychiatric/Behavioral: Negative.  Negative for confusion, dysphoric mood, self-injury and suicidal ideas. The patient is not nervous/anxious.   All other systems reviewed and are negative.      Objective:    Vitals:   09/03/18 1431   BP: 110/62  Pulse: (!) 58  Resp: 14  Temp: 98.6 F (37 C)  TempSrc: Oral  SpO2: 97%  Weight: 92  lb (41.7 kg)  Height: 5\' 3"  (1.6 m)      Physical Exam  Constitutional: She is oriented to person, place, and time. She appears well-developed. No distress.  Alert, frail elderly female, appears stated age, nontoxic appearing  HENT:  Head: Normocephalic and atraumatic.  Nose: Nose normal.  Mouth/Throat: Oropharynx is clear and moist. No oropharyngeal exudate.  Eyes: Pupils are equal, round, and reactive to light. Conjunctivae are normal. Right eye exhibits no discharge. Left eye exhibits no discharge. No scleral icterus.  Neck: Normal range of motion. No tracheal deviation present.  Cardiovascular: Normal rate, regular rhythm, normal heart sounds and intact distal pulses. Exam reveals no gallop and no friction rub.  No murmur heard. Pulmonary/Chest: Effort normal. No stridor. No respiratory distress.  Abdominal: Soft. Bowel sounds are normal. She exhibits no distension and no mass. There is no tenderness. There is no rebound and no guarding.  PEG tube with gauze surrounding site, scant brown drainage on 4x4's, no visible or active moisture, no edema, erythema, skin breakdown, or tenderness.   Tub goes across abdomen and is secured with tape on right mid abd. No irritation on abd skin from tape.  Abd otherwise, soft, thin, non-tender, non-distended  Musculoskeletal: Normal range of motion.  Neurological: She is alert and oriented to person, place, and time. She displays atrophy. She displays no tremor. No sensory deficit. She exhibits normal muscle tone. Gait abnormal. Coordination normal.  Slow shuffled but even gait  Skin: Skin is warm and dry. No rash noted. She is not diaphoretic.  Psychiatric: She has a normal mood and affect. Her behavior is normal.  Nursing note and vitals reviewed.         Assessment & Plan:      ICD-10-CM   1. Type 2 diabetes mellitus with diabetic  autonomic neuropathy, without long-term current use of insulin (HCC) E11.43 CBC with Differential/Platelet    COMPLETE METABOLIC PANEL WITH GFR    Hemoglobin A1c  2. Weight loss R63.4   3. Protein-calorie malnutrition, severe E43   4. Essential hypertension I10    controlled, no meds needed   5. Goals of care, counseling/discussion Z71.89   6. Gait instability R26.81   7. Diarrhea due to malabsorption K90.9    R19.7   8. Encounter for support and coordination of transition of care Z71.89     Currently need reassess diabetes, hypertension and hyperlipidemia management after multiple surgeries and large amount of weight loss over the past year.  Believes she still has active stage IV neuroendocrine carcinoid cancer, unclear if she is seeing oncology for this I do not have any records. Patient is able to stand walk and even go up and down stairs by herself but she is extremely weak has lost 20-30 pounds since last year.  She does have a G-tube for supplemental nutrition and is able to eat soft foods and drink without difficulty at this current time.  She denies any choking on liquids, is tolerating soft solids and denies any recent aspiration.  Her G-tube site appears well and she has no complaints of pain or redness.  Been difficulty administering Glucerna as is causing immediate, profuse and watery bowel movements so I am not sure how much of this nutrition she is actually absorbing, and have encouraged the patient and the daughter-in-law to have home health assist when they arrive.    Patient has eaten this morning and a CBG was normal.  Gave patient a glucometer and start trending fasting  blood sugar to be able to determine amount of Levemir needed.  Will have a daughter to continue to hold at this time.  Meter ordered, will keep sugar log and recheck in 1 week.  HH - to assess Pt needs shower chair and bedside commode d/t gait instability Dx: R26.89. She can walk but is very weak and walking  down the hall would hold onto her daughter-in-law or frequently grab onto the walls.  To thrive and weight loss will adjust supplemental nutrition through G-tube and reevaluate weights over the next several weeks  In reviewing hospital records I can see that oncology was consulted and noted to be "following along" I do not see any outpatient oncology visits or future appointments.  Unclear of prognosis, treatment or future treatment or surveillance at this point.  Daughter-in-law does not know any of this.  Unsure of stage IV cancer is causing further weight loss?     Addressed extensive list of chronic and acute medical problems today requiring extensive time in counseling and coordination of care.  Over half of this 45 minute visit were spent in counseling and coordinating care of multiple medical problems.   Danelle Berry, PA-C 09/03/18 2:36 PM

## 2018-09-04 ENCOUNTER — Telehealth: Payer: Self-pay | Admitting: *Deleted

## 2018-09-04 DIAGNOSIS — E43 Unspecified severe protein-calorie malnutrition: Secondary | ICD-10-CM | POA: Diagnosis not present

## 2018-09-04 DIAGNOSIS — D649 Anemia, unspecified: Secondary | ICD-10-CM | POA: Diagnosis not present

## 2018-09-04 DIAGNOSIS — E1140 Type 2 diabetes mellitus with diabetic neuropathy, unspecified: Secondary | ICD-10-CM | POA: Diagnosis not present

## 2018-09-04 DIAGNOSIS — Z431 Encounter for attention to gastrostomy: Secondary | ICD-10-CM | POA: Diagnosis not present

## 2018-09-04 DIAGNOSIS — Z794 Long term (current) use of insulin: Secondary | ICD-10-CM | POA: Diagnosis not present

## 2018-09-04 DIAGNOSIS — Z87440 Personal history of urinary (tract) infections: Secondary | ICD-10-CM | POA: Diagnosis not present

## 2018-09-04 DIAGNOSIS — M810 Age-related osteoporosis without current pathological fracture: Secondary | ICD-10-CM | POA: Diagnosis not present

## 2018-09-04 DIAGNOSIS — F0390 Unspecified dementia without behavioral disturbance: Secondary | ICD-10-CM | POA: Diagnosis not present

## 2018-09-04 DIAGNOSIS — E7849 Other hyperlipidemia: Secondary | ICD-10-CM | POA: Diagnosis not present

## 2018-09-04 DIAGNOSIS — E1165 Type 2 diabetes mellitus with hyperglycemia: Secondary | ICD-10-CM | POA: Diagnosis not present

## 2018-09-04 DIAGNOSIS — I82502 Chronic embolism and thrombosis of unspecified deep veins of left lower extremity: Secondary | ICD-10-CM | POA: Diagnosis not present

## 2018-09-04 DIAGNOSIS — I10 Essential (primary) hypertension: Secondary | ICD-10-CM | POA: Diagnosis not present

## 2018-09-04 DIAGNOSIS — C786 Secondary malignant neoplasm of retroperitoneum and peritoneum: Secondary | ICD-10-CM | POA: Diagnosis not present

## 2018-09-04 DIAGNOSIS — R1312 Dysphagia, oropharyngeal phase: Secondary | ICD-10-CM | POA: Diagnosis not present

## 2018-09-04 DIAGNOSIS — F411 Generalized anxiety disorder: Secondary | ICD-10-CM | POA: Diagnosis not present

## 2018-09-04 DIAGNOSIS — Z7901 Long term (current) use of anticoagulants: Secondary | ICD-10-CM | POA: Diagnosis not present

## 2018-09-04 DIAGNOSIS — K56609 Unspecified intestinal obstruction, unspecified as to partial versus complete obstruction: Secondary | ICD-10-CM | POA: Diagnosis not present

## 2018-09-04 DIAGNOSIS — C7A8 Other malignant neuroendocrine tumors: Secondary | ICD-10-CM | POA: Diagnosis not present

## 2018-09-04 LAB — COMPLETE METABOLIC PANEL WITH GFR
AG RATIO: 1.4 (calc) (ref 1.0–2.5)
ALBUMIN MSPROF: 4.1 g/dL (ref 3.6–5.1)
ALKALINE PHOSPHATASE (APISO): 97 U/L (ref 33–130)
ALT: 55 U/L — ABNORMAL HIGH (ref 6–29)
AST: 36 U/L — AB (ref 10–35)
BUN: 16 mg/dL (ref 7–25)
CALCIUM: 10 mg/dL (ref 8.6–10.4)
CHLORIDE: 105 mmol/L (ref 98–110)
CO2: 29 mmol/L (ref 20–32)
Creat: 0.88 mg/dL (ref 0.60–0.88)
GFR, Est African American: 69 mL/min/{1.73_m2} (ref >=60)
GFR, Est Non African American: 60 mL/min/{1.73_m2} (ref >=60)
GLOBULIN: 2.9 g/dL (ref 1.9–3.7)
Glucose, Bld: 97 mg/dL (ref 65–99)
POTASSIUM: 4.1 mmol/L (ref 3.5–5.3)
SODIUM: 140 mmol/L (ref 135–146)
Total Bilirubin: 0.8 mg/dL (ref 0.2–1.2)
Total Protein: 7 g/dL (ref 6.1–8.1)

## 2018-09-04 LAB — CBC WITH DIFFERENTIAL/PLATELET
BASOS PCT: 0.9 %
Basophils Absolute: 30 cells/uL (ref 0–200)
EOS ABS: 50 {cells}/uL (ref 15–500)
Eosinophils Relative: 1.5 %
HEMATOCRIT: 40.6 % (ref 35.0–45.0)
HEMOGLOBIN: 12.8 g/dL (ref 11.7–15.5)
LYMPHS ABS: 1224 {cells}/uL (ref 850–3900)
MCH: 26.2 pg — AB (ref 27.0–33.0)
MCHC: 31.5 g/dL — ABNORMAL LOW (ref 32.0–36.0)
MCV: 83.2 fL (ref 80.0–100.0)
MPV: 12 fL (ref 7.5–12.5)
Monocytes Relative: 9.8 %
NEUTROS ABS: 1673 {cells}/uL (ref 1500–7800)
Neutrophils Relative %: 50.7 %
Platelets: 238 10*3/uL (ref 140–400)
RBC: 4.88 10*6/uL (ref 3.80–5.10)
RDW: 15 % (ref 11.0–15.0)
TOTAL LYMPHOCYTE: 37.1 %
WBC: 3.3 10*3/uL — ABNORMAL LOW (ref 3.8–10.8)
WBCMIX: 323 {cells}/uL (ref 200–950)

## 2018-09-04 LAB — HEMOGLOBIN A1C
HEMOGLOBIN A1C: 6.2 %{Hb} — AB (ref <5.7)
MEAN PLASMA GLUCOSE: 131 (calc)
eAG (mmol/L): 7.3 (calc)

## 2018-09-04 NOTE — Telephone Encounter (Signed)
Call placed to [facility] SN and made aware of provider recommendations. HH SN will route recommendations to patient and family.

## 2018-09-04 NOTE — Telephone Encounter (Signed)
Received call from [place], [facility] SN with [place]. [phone]~ telephone.   Reports that [facility] SN established care today and requested new orders to see patient 3x weekly x1 week, 2x weekly x1 week, then every other week during certification period for medication management, G Tube management and DM education. VO given.   Also requested order for shower chair and bedside commode d/t gait instability Dx: R26.89. VO given. [facility] will fax DWO and [facility] will need to add addendum to clinical documentation from 09/03/2018.   Reports that patient continues to have increased diarrhea. Reports that nutritionist with [facility] recommended changing feeding from Glucerna to Vital 1.5 to help decrease the loose stools. Also recommended fiber. Please advise.   States that patient has redness and irritation under guaze dressing at G Tube insertion site. Advised to clean area with warm soapy water QD, pat dry and place zinc barrier cream to surrounding skin. Cover with clean/ dry 4x4. Any further recommendations?  Patient reports that she has not been checking her FSBS or taking any Levemir since 08/28/2018. HH SN requested clarification on Levemir. Advised that if FSBS <100, hold Levemir. Requested order to check FSBS at night prior to administration. VO given.

## 2018-09-04 NOTE — Telephone Encounter (Signed)
Yes, agree with change from glucerna to vital 1.5   For G-tube - agree with plan - daily use of barrier ointment or cream and absorbant gauze, let us know if not improving  For levemir -   Check AM FCBG and keep a log of sugars.  Start recording for a week, hold levemir.  Lets get nutrition in and adjusted, figure out what morning fasting sugars are running, and then decide insulin need and/or dosing.

## 2018-09-05 ENCOUNTER — Ambulatory Visit: Payer: Self-pay | Admitting: Family Medicine

## 2018-09-05 DIAGNOSIS — Z431 Encounter for attention to gastrostomy: Secondary | ICD-10-CM | POA: Diagnosis not present

## 2018-09-05 DIAGNOSIS — E43 Unspecified severe protein-calorie malnutrition: Secondary | ICD-10-CM | POA: Diagnosis not present

## 2018-09-05 DIAGNOSIS — I82502 Chronic embolism and thrombosis of unspecified deep veins of left lower extremity: Secondary | ICD-10-CM | POA: Diagnosis not present

## 2018-09-05 DIAGNOSIS — C7A8 Other malignant neuroendocrine tumors: Secondary | ICD-10-CM | POA: Diagnosis not present

## 2018-09-05 DIAGNOSIS — C786 Secondary malignant neoplasm of retroperitoneum and peritoneum: Secondary | ICD-10-CM | POA: Diagnosis not present

## 2018-09-05 DIAGNOSIS — E1165 Type 2 diabetes mellitus with hyperglycemia: Secondary | ICD-10-CM | POA: Diagnosis not present

## 2018-09-05 NOTE — Telephone Encounter (Signed)
Received F/U call from [place]attie, [facility] SN with [place]. [phone]~ telephone.  Requested to add [facility] social worker and palliative referral for patient. VO given.

## 2018-09-05 NOTE — Telephone Encounter (Signed)
We did palliative referral before.  Was that what the daughter refused?  Sounds good thanks

## 2018-09-08 ENCOUNTER — Other Ambulatory Visit: Payer: Self-pay | Admitting: Family Medicine

## 2018-09-09 ENCOUNTER — Telehealth: Payer: Self-pay | Admitting: *Deleted

## 2018-09-09 ENCOUNTER — Other Ambulatory Visit (HOSPITAL_COMMUNITY): Payer: Self-pay | Admitting: Interventional Radiology

## 2018-09-09 DIAGNOSIS — R1312 Dysphagia, oropharyngeal phase: Secondary | ICD-10-CM

## 2018-09-09 DIAGNOSIS — C786 Secondary malignant neoplasm of retroperitoneum and peritoneum: Secondary | ICD-10-CM | POA: Diagnosis not present

## 2018-09-09 DIAGNOSIS — C7A8 Other malignant neuroendocrine tumors: Secondary | ICD-10-CM | POA: Diagnosis not present

## 2018-09-09 DIAGNOSIS — I82502 Chronic embolism and thrombosis of unspecified deep veins of left lower extremity: Secondary | ICD-10-CM | POA: Diagnosis not present

## 2018-09-09 DIAGNOSIS — Z431 Encounter for attention to gastrostomy: Secondary | ICD-10-CM | POA: Diagnosis not present

## 2018-09-09 DIAGNOSIS — E1165 Type 2 diabetes mellitus with hyperglycemia: Secondary | ICD-10-CM | POA: Diagnosis not present

## 2018-09-09 DIAGNOSIS — E43 Unspecified severe protein-calorie malnutrition: Secondary | ICD-10-CM | POA: Diagnosis not present

## 2018-09-09 MED ORDER — NYSTATIN 100000 UNIT/ML MT SUSP: 5.0000 mL | Freq: Four times a day (QID) | OROMUCOSAL | 0 refills | Status: DC

## 2018-09-09 NOTE — Telephone Encounter (Signed)
Received call from [place]attie, [facility] SN with [place]. [phone]~ telephone.   Reports that patient is tolerating new formula Vital 1.5 very well.   States that patient continues to have increased irritation to abd at G tube insertion site. Reports that Zinc has improved area a small amount, but has not completely healed. Also inquired as to if patient has been referred to GI.   Also reports that patient has white film noted to mouth. MD made aware and new orders given for Nystatin mouthwash. Prescription sent to pharmacy.   Advised to schedule appt for PA to re-evaluate wound.

## 2018-09-09 NOTE — Telephone Encounter (Signed)
Received call from [place], [facility] PT with [place] [phone]~ telephone.   Requested orders to extend [facility] PT 2x weekly x4 weeks for walking endurance and balance. VO given.

## 2018-09-10 ENCOUNTER — Inpatient Hospital Stay: Payer: Medicare Other | Admitting: Family Medicine

## 2018-09-10 NOTE — Telephone Encounter (Signed)
VO ok

## 2018-09-11 DIAGNOSIS — C7A8 Other malignant neuroendocrine tumors: Secondary | ICD-10-CM | POA: Diagnosis not present

## 2018-09-11 DIAGNOSIS — Z431 Encounter for attention to gastrostomy: Secondary | ICD-10-CM | POA: Diagnosis not present

## 2018-09-11 DIAGNOSIS — E43 Unspecified severe protein-calorie malnutrition: Secondary | ICD-10-CM | POA: Diagnosis not present

## 2018-09-11 DIAGNOSIS — E1165 Type 2 diabetes mellitus with hyperglycemia: Secondary | ICD-10-CM | POA: Diagnosis not present

## 2018-09-11 DIAGNOSIS — I82502 Chronic embolism and thrombosis of unspecified deep veins of left lower extremity: Secondary | ICD-10-CM | POA: Diagnosis not present

## 2018-09-11 DIAGNOSIS — C786 Secondary malignant neoplasm of retroperitoneum and peritoneum: Secondary | ICD-10-CM | POA: Diagnosis not present

## 2018-09-11 NOTE — Telephone Encounter (Signed)
Received call from [place], [facility] OT with [place] [phone] telephone.   Requested orders to extend OT services 1x weekly x4 weeks for strengthening. VO given.

## 2018-09-12 ENCOUNTER — Other Ambulatory Visit (HOSPITAL_COMMUNITY): Payer: Self-pay | Admitting: Interventional Radiology

## 2018-09-12 ENCOUNTER — Telehealth: Payer: Self-pay | Admitting: *Deleted

## 2018-09-12 ENCOUNTER — Encounter (HOSPITAL_COMMUNITY): Payer: Self-pay | Admitting: Interventional Radiology

## 2018-09-12 ENCOUNTER — Ambulatory Visit (HOSPITAL_COMMUNITY)
Admission: RE | Admit: 2018-09-12 | Discharge: 2018-09-12 | Disposition: A | Discharge status: Home / Self Care | Payer: Medicare Other | Source: Ambulatory Visit | Attending: Interventional Radiology | Admitting: Interventional Radiology

## 2018-09-12 DIAGNOSIS — R1312 Dysphagia, oropharyngeal phase: Secondary | ICD-10-CM | POA: Diagnosis not present

## 2018-09-12 DIAGNOSIS — Z431 Encounter for attention to gastrostomy: Secondary | ICD-10-CM | POA: Insufficient documentation

## 2018-09-12 DIAGNOSIS — K9423 Gastrostomy malfunction: Secondary | ICD-10-CM | POA: Diagnosis not present

## 2018-09-12 HISTORY — PX: IR CM INJ ANY COLONIC TUBE W/FLUORO: IMG2336

## 2018-09-12 MED ORDER — IOPAMIDOL (ISOVUE-300) INJECTION 61%
INTRAVENOUS | Status: AC
  Administered 2018-09-12: 10 mL
  Filled 2018-09-12: qty 50

## 2018-09-12 MED ORDER — LIDOCAINE VISCOUS HCL 2 % MT SOLN
OROMUCOSAL | Status: AC
  Filled 2018-09-12: qty 15

## 2018-09-12 NOTE — Procedures (Signed)
Pre procedural Dx: Dysphagia, poorly functioning feeding tube. Post procedural Dx: Same  Appropriately positioned and function pull through G-tube.  No exchange performed. The feeding tube is ready for immediate use.  EBL: None  Complications: None immediate.  Ronny Bacon, MD Pager #: [phone]

## 2018-09-12 NOTE — Telephone Encounter (Signed)
Called and talked to Theresa Howe.  Will restart Levemir at 4 units, injected SQ before bedtime. Continue to check morning fasting blood sugars.  Optimal range for FCBG 80-110 If >110, +2 U for 2-3 days and continue to monitor FCBG before another dose change If < 80, -2 U for 2-3 days and continue to monitor FCBG before another dose change  Theresa Howe has no one scheduled to go out tomorrow, so she will try and arrange so RN can teach family how to administer Levemir.  It has not been given since leaving nursing home 2 weeks ago  Pt is currently tolerating 3 cans of Vital 1.5

## 2018-09-12 NOTE — Telephone Encounter (Signed)
Received call from [place], [facility] SN with [place]. [phone]~ telephone.   Reports that family has been monitoring patient FSBS x1 week while she has not been on insulin.   States that the range of readings are 123- 146.  Please advise.

## 2018-09-13 DIAGNOSIS — C7A8 Other malignant neuroendocrine tumors: Secondary | ICD-10-CM | POA: Diagnosis not present

## 2018-09-13 DIAGNOSIS — Z431 Encounter for attention to gastrostomy: Secondary | ICD-10-CM | POA: Diagnosis not present

## 2018-09-13 DIAGNOSIS — C786 Secondary malignant neoplasm of retroperitoneum and peritoneum: Secondary | ICD-10-CM | POA: Diagnosis not present

## 2018-09-13 DIAGNOSIS — E43 Unspecified severe protein-calorie malnutrition: Secondary | ICD-10-CM | POA: Diagnosis not present

## 2018-09-13 DIAGNOSIS — I82502 Chronic embolism and thrombosis of unspecified deep veins of left lower extremity: Secondary | ICD-10-CM | POA: Diagnosis not present

## 2018-09-13 DIAGNOSIS — E1165 Type 2 diabetes mellitus with hyperglycemia: Secondary | ICD-10-CM | POA: Diagnosis not present

## 2018-09-16 ENCOUNTER — Telehealth: Payer: Self-pay | Admitting: Family Medicine

## 2018-09-16 DIAGNOSIS — C7A8 Other malignant neuroendocrine tumors: Secondary | ICD-10-CM | POA: Diagnosis not present

## 2018-09-16 DIAGNOSIS — C786 Secondary malignant neoplasm of retroperitoneum and peritoneum: Secondary | ICD-10-CM | POA: Diagnosis not present

## 2018-09-16 DIAGNOSIS — Z431 Encounter for attention to gastrostomy: Secondary | ICD-10-CM | POA: Diagnosis not present

## 2018-09-16 DIAGNOSIS — I82502 Chronic embolism and thrombosis of unspecified deep veins of left lower extremity: Secondary | ICD-10-CM | POA: Diagnosis not present

## 2018-09-16 DIAGNOSIS — E1165 Type 2 diabetes mellitus with hyperglycemia: Secondary | ICD-10-CM | POA: Diagnosis not present

## 2018-09-16 DIAGNOSIS — E43 Unspecified severe protein-calorie malnutrition: Secondary | ICD-10-CM | POA: Diagnosis not present

## 2018-09-16 MED ORDER — INSULIN PEN NEEDLE 32G X 6 MM MISC: 1 refills | Status: DC

## 2018-09-16 NOTE — Telephone Encounter (Signed)
[facility] called stating that pt needs needles for her flex pen levimir called in today since she is starting insulin.

## 2018-09-16 NOTE — Telephone Encounter (Signed)
[facility] [place]

## 2018-09-16 NOTE — Telephone Encounter (Signed)
Needles sent to pharmacy

## 2018-09-17 ENCOUNTER — Ambulatory Visit (INDEPENDENT_AMBULATORY_CARE_PROVIDER_SITE_OTHER): Payer: Medicare Other | Admitting: Family Medicine

## 2018-09-17 ENCOUNTER — Encounter: Payer: Self-pay | Admitting: Family Medicine

## 2018-09-17 ENCOUNTER — Other Ambulatory Visit: Payer: Self-pay

## 2018-09-17 VITALS — BP 102/60 | HR 66 | Temp 98.4°F | Resp 16 | Ht 63.0 in | Wt 94.0 lb

## 2018-09-17 DIAGNOSIS — E1143 Type 2 diabetes mellitus with diabetic autonomic (poly)neuropathy: Secondary | ICD-10-CM

## 2018-09-17 DIAGNOSIS — R634 Abnormal weight loss: Secondary | ICD-10-CM

## 2018-09-17 DIAGNOSIS — E43 Unspecified severe protein-calorie malnutrition: Secondary | ICD-10-CM

## 2018-09-17 DIAGNOSIS — Z431 Encounter for attention to gastrostomy: Secondary | ICD-10-CM | POA: Diagnosis not present

## 2018-09-17 DIAGNOSIS — R945 Abnormal results of liver function studies: Secondary | ICD-10-CM

## 2018-09-17 DIAGNOSIS — R7989 Other specified abnormal findings of blood chemistry: Secondary | ICD-10-CM

## 2018-09-17 DIAGNOSIS — C786 Secondary malignant neoplasm of retroperitoneum and peritoneum: Secondary | ICD-10-CM | POA: Diagnosis not present

## 2018-09-17 DIAGNOSIS — I82502 Chronic embolism and thrombosis of unspecified deep veins of left lower extremity: Secondary | ICD-10-CM | POA: Diagnosis not present

## 2018-09-17 DIAGNOSIS — C7A8 Other malignant neuroendocrine tumors: Secondary | ICD-10-CM | POA: Diagnosis not present

## 2018-09-17 DIAGNOSIS — E1165 Type 2 diabetes mellitus with hyperglycemia: Secondary | ICD-10-CM | POA: Diagnosis not present

## 2018-09-17 NOTE — Progress Notes (Signed)
Patient ID: Theresa Howe, female    DOB: [birth year], 84 y.o.   MRN: [id number]  PCP: [name], MD  Chief Complaint  Patient presents with  . Follow-up    FSBS    Subjective:   Theresa Howe is a 84 y.o. female, presents to clinic with CC of diabetes recheck and following up on fasting blood sugar levels that they have been logging, sugars have been running 96-130's, without starting levemir.  Needed pens to be able to start administering Levemir and they did not have.   They were given new orders from home health nurse to start giving 4 units of Levemir at night however were unable to administer in her here to recheck and review medications and her current sugars.   Tube feeds supplemental, able to give her about 3-1/2 cans of vital.  Her weight has increased a little bit since being home.  Wt Readings from Last 5 Encounters:  09/17/18 94 lb (42.6 kg)  09/03/18 92 lb (41.7 kg)  04/11/18 119 lb (54 kg)  01/24/18 119 lb 7.8 oz (54.2 kg)  12/10/17 127 lb (57.6 kg)   Is being at home with her son and daughter-in-law, daughter-in-law was able to follow-up with the hospital on some of her concerns including the G-tube management.  It was placed sometime earlier this year while patient was staying at a long-term care facility and she had never had any outpatient follow-up with any surgeons or interventional radiologist.  A lot took the patient to [facility] where they are able to see surgeon/radiologist [facility] - someone looked at tube and gave management advise and got imaging.  Tube placement good, change in mangement with dressing has improved the skin, redness and pain, sig improved.  [facility] had a hard time locating the person who did the procedure.  But they said she has the "cadillac" of tubes.  Pt and her DIL inquire about chance to have it removed in the future.  Pt still nibbing small amounts of food, no whole meals, drinks ok, no N, V abdominal pain.  Home health and PT OT SLP,  doing well at home with multiple therapies.    Patient has not seen oncology outpatient, after having surgery and after tissues went to pathology they were told by the general surgeon that it was a very slow-growing cancer and would likely not need any treatment outpatient, notes to that effect stated that oncology was "following" but I do not see any formal consult by oncology.  Will f/up with surgeon to see if any outpt oncology consult is needed 1-2 a year or at all.   Patient Active Problem List   Diagnosis Date Noted  . Advance care planning   . On total parenteral nutrition (TPN)   . Dysphagia   . Intra-abdominal abscess (HCC)   . Oral thrush   . Palliative care by specialist   . Goals of care, counseling/discussion   . Carcinomatosis (HCC) 01/17/2018  . Failure to thrive (0-17) 12/24/2017  . Protein-calorie malnutrition, severe 12/06/2017  . Small bowel mass 12/04/2017  . Protein-calorie malnutrition (HCC) 09/11/2017  . Weight loss 09/11/2017  . Diabetic neuropathy (HCC) 09/28/2015  . Hip pain 09/21/2014  . Muscle ache of extremity 06/12/2014  . Anxiety state 10/06/2013  . Bladder prolapse, female, acquired 08/03/2013  . Osteoporosis, unspecified 08/03/2013  . Nonspecific (abnormal) findings on radiological and other examination of biliary tract 04/17/2013  . Diabetes with neurologic complications (HCC) 09/18/2011  .  HTN (hypertension) 09/18/2011  . Hyperlipidemia LDL goal <70 09/18/2011  . Glaucoma 09/18/2011     Prior to Admission medications   Medication Sig Start Date End Date Taking? Authorizing Provider  acetaminophen (TYLENOL) 325 MG tablet Take 650 mg by mouth every 6 (six) hours as needed.   Yes [provider]  apixaban (ELIQUIS) 2.5 MG TABS tablet Take 2.5 mg by mouth 2 (two) times daily.   Yes [provider]  Blood Glucose Monitoring Suppl (BLOOD GLUCOSE SYSTEM PAK) KIT Please dispense based on patient and insurance preference. Use as  directed to monitor FSBS 1x daily. Dx: E11.9. 09/03/18  Yes Danelle Berry, PA-C  CAPSAICIN EX Apply topically.   Yes [provider]  cholecalciferol (VITAMIN D) 1000 units tablet Take 1,000 Units by mouth daily.   Yes [provider]  Glucose Blood (BLOOD GLUCOSE TEST STRIPS) STRP Please dispense based on patient and insurance preference. Use as directed to monitor FSBS 1x daily. Dx: E11.9. 09/03/18  Yes Danelle Berry, PA-C  insulin detemir (LEVEMIR) 100 UNIT/ML injection Inject 4 Units into the skin daily.    Yes [provider]  Insulin Pen Needle (BD PEN NEEDLE MICRO U/F) 32G X 6 MM MISC Use with insulin pen ICD10:e11.43 09/16/18  Yes [place], Velna Hatchet, MD  Lancets MISC Please dispense based on patient and insurance preference. Use as directed to monitor FSBS 1x daily. Dx: E11.9. 09/03/18  Yes Danelle Berry, PA-C  ondansetron (ZOFRAN) 4 MG tablet Take 4 mg by mouth every 8 (eight) hours as needed for nausea or vomiting.   Yes [provider]  pantoprazole (PROTONIX) 40 MG tablet Take 40 mg by mouth daily.   Yes [provider]     No Known Allergies   History reviewed. No pertinent family history.   Social History   Socioeconomic History  . Marital status: Widowed    Spouse name: Not on file  . Number of children: Not on file  . Years of education: Not on file  . Highest education level: Not on file  Occupational History  . Not on file  Social Needs  . Financial resource strain: Not on file  . Food insecurity:    Worry: Not on file    Inability: Not on file  . Transportation needs:    Medical: Not on file    Non-medical: Not on file  Tobacco Use  . Smoking status: Never Smoker  . Smokeless tobacco: Never Used  Substance and Sexual Activity  . Alcohol use: No  . Drug use: No  . Sexual activity: Not Currently  Lifestyle  . Physical activity:    Days per week: Not on file    Minutes per session: Not on file  . Stress: Not on file    Relationships  . Social connections:    Talks on phone: Not on file    Gets together: Not on file    Attends religious service: Not on file    Active member of club or organization: Not on file    Attends meetings of clubs or organizations: Not on file    Relationship status: Not on file  . Intimate partner violence:    Fear of current or ex partner: Not on file    Emotionally abused: Not on file    Physically abused: Not on file    Forced sexual activity: Not on file  Other Topics Concern  . Not on file  Social History Narrative  . Not on  file     Review of Systems  Constitutional: Negative.   HENT: Negative.   Eyes: Negative.   Respiratory: Negative.   Cardiovascular: Negative.   Gastrointestinal: Negative.  Negative for abdominal pain, constipation, diarrhea, nausea and vomiting.  Endocrine: Negative.   Genitourinary: Negative.   Musculoskeletal: Negative.   Skin: Negative.  Negative for color change.  Allergic/Immunologic: Negative.   Neurological: Negative.   Hematological: Negative.   Psychiatric/Behavioral: Negative.   All other systems reviewed and are negative.      Objective:    Vitals:   09/17/18 1612  BP: 102/60  Pulse: 66  Resp: 16  Temp: 98.4 F (36.9 C)  TempSrc: Oral  SpO2: 99%  Weight: 94 lb (42.6 kg)  Height: 5\' 3"  (1.6 m)      Physical Exam  Constitutional: She is oriented to person, place, and time. She appears well-developed. No distress.  Alert, frail elderly female, appears stated age, nontoxic appearing  HENT:  Head: Normocephalic and atraumatic.  Nose: Nose normal.  Mouth/Throat: Oropharynx is clear and moist.  Eyes: Pupils are equal, round, and reactive to light. Conjunctivae are normal. Right eye exhibits no discharge. Left eye exhibits no discharge. No scleral icterus.  Neck: Normal range of motion. No tracheal deviation present.  Cardiovascular: Normal rate, regular rhythm, normal heart sounds and intact distal pulses.  Exam reveals no gallop and no friction rub.  No murmur heard. Pulmonary/Chest: Effort normal. No stridor. No respiratory distress.  Abdominal: Soft. Bowel sounds are normal. She exhibits no distension and no mass. There is no tenderness. There is no rebound and no guarding.  PEG tube plastic base at abdomen, skin w/o edema erythema or any drainage.  Abd non-tender  Musculoskeletal: Normal range of motion.  Neurological: She is alert and oriented to person, place, and time. She displays atrophy. She displays no tremor. No sensory deficit. She exhibits normal muscle tone. Gait abnormal. Coordination normal.  Slow shuffled but even gait  Skin: Skin is warm and dry. No rash noted. She is not diaphoretic.  Psychiatric: She has a normal mood and affect. Her behavior is normal.  Nursing note and vitals reviewed.         Assessment & Plan:      ICD-10-CM   1. Type 2 diabetes mellitus with diabetic autonomic neuropathy, without long-term current use of insulin (HCC) E11.43 COMPLETE METABOLIC PANEL WITH GFR  2. Weight loss R63.4   3. Protein-calorie malnutrition, severe E43   4. Elevated LFTs R94.5 COMPLETE METABOLIC PANEL WITH GFR    84 year old female presents for recheck of weight loss, protein calorie malnutrition, and type 2 diabetes, currently at home with her son and daughter-in-law after extended long-term care following multiple surgeries.  She is starting to gain weight, up 2 pounds.  She is tolerating tube feeds as adjusted by home health.  Encouraged to continue current orders for supplementation and work on experimenting with foods and volume of foods that the patient is able to tolerate by mouth.  Will recheck weight in 2 weeks. Patient has been T2DM without any prior use of insulin, she was discharged after these multiple surgeries with Levemir to take at home, since being home for roughly 3 or 4 weeks she has still not started it due to multiple factors.   They are monitoring fasting  sugars which range from 90s to 130s.  Will start very low-dose Levemir 4 units once at night and continue to monitor fasting blood sugars.  Home health will  assist with managing this and they have orders for dose titration based upon fasting labs. Although this may be a small amount of basal insulin would like to make sure that the patient is able to use all nutrition and sugars that she is eating and receiving in her tube feeds that she can gain weight and would hate for elevated blood sugars to work against her progress to regain weight and strength.   Danelle Berry, PA-C 09/17/18 4:20 PM

## 2018-09-17 NOTE — Patient Instructions (Addendum)
Sugar meter -  Do the 4 units at night  Watch morning sugars

## 2018-09-18 DIAGNOSIS — E1165 Type 2 diabetes mellitus with hyperglycemia: Secondary | ICD-10-CM | POA: Diagnosis not present

## 2018-09-18 DIAGNOSIS — C786 Secondary malignant neoplasm of retroperitoneum and peritoneum: Secondary | ICD-10-CM | POA: Diagnosis not present

## 2018-09-18 DIAGNOSIS — E43 Unspecified severe protein-calorie malnutrition: Secondary | ICD-10-CM | POA: Diagnosis not present

## 2018-09-18 DIAGNOSIS — C7A8 Other malignant neuroendocrine tumors: Secondary | ICD-10-CM | POA: Diagnosis not present

## 2018-09-18 DIAGNOSIS — Z431 Encounter for attention to gastrostomy: Secondary | ICD-10-CM | POA: Diagnosis not present

## 2018-09-18 DIAGNOSIS — I82502 Chronic embolism and thrombosis of unspecified deep veins of left lower extremity: Secondary | ICD-10-CM | POA: Diagnosis not present

## 2018-09-19 ENCOUNTER — Other Ambulatory Visit: Payer: Self-pay | Admitting: Family Medicine

## 2018-09-19 DIAGNOSIS — C7A8 Other malignant neuroendocrine tumors: Secondary | ICD-10-CM

## 2018-09-19 DIAGNOSIS — C800 Disseminated malignant neoplasm, unspecified: Secondary | ICD-10-CM

## 2018-09-19 NOTE — Progress Notes (Signed)
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  I have been seeing her since being discharged from [facility] for multiple surgeries, failure to thrive, TPN.  There was notation of oncology following along with her various hospitalizations however most records are nursing homes or LTAC in records are unavailable.  I do not see any outpatient oncology consult since this diagnosis of cancer.  I did discuss this with the patient and her family at the most recent office visit here.  They were under the impression that it was such a slow-growing cancer that she would never need treatment for it and they have not seen oncology in several months.  They did suggest that I could ask their surgeon Dr. Barry Dienes.  I contacted him regarding this and Dr. Barry Dienes does advised that they should consult with oncology outpatient so orders being entered today.  Patient will be called and notified of the consult.

## 2018-09-24 DIAGNOSIS — C7A8 Other malignant neuroendocrine tumors: Secondary | ICD-10-CM | POA: Diagnosis not present

## 2018-09-24 DIAGNOSIS — E43 Unspecified severe protein-calorie malnutrition: Secondary | ICD-10-CM | POA: Diagnosis not present

## 2018-09-24 DIAGNOSIS — C786 Secondary malignant neoplasm of retroperitoneum and peritoneum: Secondary | ICD-10-CM | POA: Diagnosis not present

## 2018-09-24 DIAGNOSIS — E1165 Type 2 diabetes mellitus with hyperglycemia: Secondary | ICD-10-CM | POA: Diagnosis not present

## 2018-09-24 DIAGNOSIS — Z431 Encounter for attention to gastrostomy: Secondary | ICD-10-CM | POA: Diagnosis not present

## 2018-09-24 DIAGNOSIS — I82502 Chronic embolism and thrombosis of unspecified deep veins of left lower extremity: Secondary | ICD-10-CM | POA: Diagnosis not present

## 2018-09-25 ENCOUNTER — Inpatient Hospital Stay (HOSPITAL_BASED_OUTPATIENT_CLINIC_OR_DEPARTMENT_OTHER)
Admission: EM | Admit: 2018-09-25 | Discharge: 2018-10-01 | DRG: 389 | Disposition: A | Discharge status: Home / Self Care | Payer: Medicare Other | Attending: Internal Medicine | Admitting: Internal Medicine

## 2018-09-25 ENCOUNTER — Emergency Department (HOSPITAL_BASED_OUTPATIENT_CLINIC_OR_DEPARTMENT_OTHER): Payer: Medicare Other

## 2018-09-25 ENCOUNTER — Other Ambulatory Visit: Payer: Self-pay

## 2018-09-25 ENCOUNTER — Encounter (HOSPITAL_BASED_OUTPATIENT_CLINIC_OR_DEPARTMENT_OTHER): Payer: Self-pay | Admitting: *Deleted

## 2018-09-25 DIAGNOSIS — K56609 Unspecified intestinal obstruction, unspecified as to partial versus complete obstruction: Principal | ICD-10-CM | POA: Diagnosis present

## 2018-09-25 DIAGNOSIS — Z79899 Other long term (current) drug therapy: Secondary | ICD-10-CM | POA: Diagnosis not present

## 2018-09-25 DIAGNOSIS — Z681 Body mass index (BMI) 19 or less, adult: Secondary | ICD-10-CM

## 2018-09-25 DIAGNOSIS — Z931 Gastrostomy status: Secondary | ICD-10-CM | POA: Diagnosis not present

## 2018-09-25 DIAGNOSIS — K5669 Other partial intestinal obstruction: Secondary | ICD-10-CM | POA: Diagnosis not present

## 2018-09-25 DIAGNOSIS — C7A8 Other malignant neuroendocrine tumors: Secondary | ICD-10-CM | POA: Diagnosis present

## 2018-09-25 DIAGNOSIS — Z86718 Personal history of other venous thrombosis and embolism: Secondary | ICD-10-CM | POA: Diagnosis not present

## 2018-09-25 DIAGNOSIS — I10 Essential (primary) hypertension: Secondary | ICD-10-CM | POA: Diagnosis present

## 2018-09-25 DIAGNOSIS — M810 Age-related osteoporosis without current pathological fracture: Secondary | ICD-10-CM | POA: Diagnosis present

## 2018-09-25 DIAGNOSIS — Z8589 Personal history of malignant neoplasm of other organs and systems: Secondary | ICD-10-CM

## 2018-09-25 DIAGNOSIS — C786 Secondary malignant neoplasm of retroperitoneum and peritoneum: Secondary | ICD-10-CM | POA: Diagnosis present

## 2018-09-25 DIAGNOSIS — Z9049 Acquired absence of other specified parts of digestive tract: Secondary | ICD-10-CM | POA: Diagnosis not present

## 2018-09-25 DIAGNOSIS — Z90710 Acquired absence of both cervix and uterus: Secondary | ICD-10-CM

## 2018-09-25 DIAGNOSIS — Z23 Encounter for immunization: Secondary | ICD-10-CM

## 2018-09-25 DIAGNOSIS — R627 Adult failure to thrive: Secondary | ICD-10-CM | POA: Diagnosis present

## 2018-09-25 DIAGNOSIS — Z794 Long term (current) use of insulin: Secondary | ICD-10-CM

## 2018-09-25 DIAGNOSIS — H409 Unspecified glaucoma: Secondary | ICD-10-CM | POA: Diagnosis present

## 2018-09-25 DIAGNOSIS — Z9841 Cataract extraction status, right eye: Secondary | ICD-10-CM

## 2018-09-25 DIAGNOSIS — R109 Unspecified abdominal pain: Secondary | ICD-10-CM | POA: Diagnosis not present

## 2018-09-25 DIAGNOSIS — E785 Hyperlipidemia, unspecified: Secondary | ICD-10-CM | POA: Diagnosis present

## 2018-09-25 DIAGNOSIS — R1084 Generalized abdominal pain: Secondary | ICD-10-CM | POA: Diagnosis not present

## 2018-09-25 DIAGNOSIS — Z7901 Long term (current) use of anticoagulants: Secondary | ICD-10-CM | POA: Diagnosis not present

## 2018-09-25 DIAGNOSIS — E876 Hypokalemia: Secondary | ICD-10-CM | POA: Diagnosis not present

## 2018-09-25 DIAGNOSIS — R64 Cachexia: Secondary | ICD-10-CM | POA: Diagnosis present

## 2018-09-25 DIAGNOSIS — K08409 Partial loss of teeth, unspecified cause, unspecified class: Secondary | ICD-10-CM | POA: Diagnosis present

## 2018-09-25 DIAGNOSIS — E1143 Type 2 diabetes mellitus with diabetic autonomic (poly)neuropathy: Secondary | ICD-10-CM | POA: Diagnosis not present

## 2018-09-25 DIAGNOSIS — K56600 Partial intestinal obstruction, unspecified as to cause: Secondary | ICD-10-CM | POA: Diagnosis present

## 2018-09-25 DIAGNOSIS — C800 Disseminated malignant neoplasm, unspecified: Secondary | ICD-10-CM | POA: Diagnosis not present

## 2018-09-25 DIAGNOSIS — Z4682 Encounter for fitting and adjustment of non-vascular catheter: Secondary | ICD-10-CM | POA: Diagnosis not present

## 2018-09-25 DIAGNOSIS — E1140 Type 2 diabetes mellitus with diabetic neuropathy, unspecified: Secondary | ICD-10-CM | POA: Diagnosis present

## 2018-09-25 DIAGNOSIS — R1110 Vomiting, unspecified: Secondary | ICD-10-CM | POA: Diagnosis not present

## 2018-09-25 DIAGNOSIS — IMO0001 Reserved for inherently not codable concepts without codable children: Secondary | ICD-10-CM | POA: Diagnosis present

## 2018-09-25 DIAGNOSIS — E119 Type 2 diabetes mellitus without complications: Secondary | ICD-10-CM

## 2018-09-25 LAB — LIPASE, BLOOD: LIPASE: 39 U/L (ref 11–51)

## 2018-09-25 LAB — COMPREHENSIVE METABOLIC PANEL
ALBUMIN: 3.6 g/dL (ref 3.5–5.0)
ALT: 25 U/L (ref 0–44)
ANION GAP: 9 (ref 5–15)
AST: 27 U/L (ref 15–41)
Alkaline Phosphatase: 65 U/L (ref 38–126)
BUN: 13 mg/dL (ref 8–23)
CO2: 26 mmol/L (ref 22–32)
Calcium: 9.5 mg/dL (ref 8.9–10.3)
Chloride: 103 mmol/L (ref 98–111)
Creatinine, Ser: 0.57 mg/dL (ref 0.44–1.00)
Glucose, Bld: 112 mg/dL — ABNORMAL HIGH (ref 70–99)
POTASSIUM: 3.7 mmol/L (ref 3.5–5.1)
Sodium: 138 mmol/L (ref 135–145)
TOTAL PROTEIN: 6.6 g/dL (ref 6.5–8.1)
Total Bilirubin: 0.6 mg/dL (ref 0.3–1.2)

## 2018-09-25 LAB — CBC WITH DIFFERENTIAL/PLATELET
Abs Immature Granulocytes: 0 K/uL (ref 0.00–0.07)
Basophils Absolute: 0 K/uL (ref 0.0–0.1)
Basophils Relative: 0 %
Eosinophils Absolute: 0 K/uL (ref 0.0–0.5)
Eosinophils Relative: 0 %
HCT: 43.8 % (ref 36.0–46.0)
Hemoglobin: 14.1 g/dL (ref 12.0–15.0)
Immature Granulocytes: 0 %
Lymphocytes Relative: 23 %
Lymphs Abs: 0.7 K/uL (ref 0.7–4.0)
MCH: 27.3 pg (ref 26.0–34.0)
MCHC: 32.2 g/dL (ref 30.0–36.0)
MCV: 84.9 fL (ref 80.0–100.0)
Monocytes Absolute: 0.2 K/uL (ref 0.1–1.0)
Monocytes Relative: 6 %
Neutro Abs: 2.1 K/uL (ref 1.7–7.7)
Neutrophils Relative %: 71 %
Platelets: 238 K/uL (ref 150–400)
RBC: 5.16 MIL/uL — ABNORMAL HIGH (ref 3.87–5.11)
RDW: 15.9 % — ABNORMAL HIGH (ref 11.5–15.5)
WBC: 3 K/uL — ABNORMAL LOW (ref 4.0–10.5)
nRBC: 0 % (ref 0.0–0.2)

## 2018-09-25 LAB — TROPONIN I

## 2018-09-25 MED ORDER — SODIUM CHLORIDE 0.45 % IV SOLN
INTRAVENOUS | Status: DC
  Administered 2018-09-25: 23:00:00 via INTRAVENOUS

## 2018-09-25 MED ORDER — IOPAMIDOL (ISOVUE-300) INJECTION 61%
100.0000 mL | Freq: Once | INTRAVENOUS | Status: AC | PRN
  Administered 2018-09-25: 100 mL via INTRAVENOUS

## 2018-09-25 NOTE — ED Notes (Signed)
251ml Oral CT contrast administered via PEG tube per v.o. Dr. Ralene Bathe. PEG flushed with 22ml sterile water.

## 2018-09-25 NOTE — ED Provider Notes (Signed)
[facility] EMERGENCY DEPARTMENT Provider Note   CSN: [id number] Arrival date & time: 09/25/18  1803     History   Chief Complaint Chief Complaint  Patient presents with  . Emesis    HPI Theresa Howe is a 84 y.o. female.  The history is provided by the patient, a relative and medical records. No language interpreter was used.  Emesis     Theresa Howe is a 84 y.o. female who presents to the Emergency Department complaining of vomiting. She presents to the emergency department accompanied by her nephew for evaluation of vomiting. She has a gastrostomy tube four tube feeds four times daily due to history of failure to thrive and poor oral intake. She received two tube feeds today followed by emesis times two. She has no abdominal pain or nausea. Denies chest pain, shortness of breath, dysuria. She has mild chronic diarrhea, unchanged from baseline. Past Medical History:  Diagnosis Date  . Abdominal mass 10/2017   SMALL BOWEL  . Diabetes mellitus    type 2  . Dyspnea   . Failure to thrive (0-17) 12/24/2017  . Glaucoma   . Hyperlipidemia   . Hypertension   . Neuropathy   . Osteoporosis   . Wheezing     Patient Active Problem List   Diagnosis Date Noted  . SBO (small bowel obstruction) ([place]) 09/25/2018  . Advance care planning   . On total parenteral nutrition (TPN)   . Dysphagia   . Intra-abdominal abscess ([place])   . Oral thrush   . Palliative care by specialist   . Goals of care, counseling/discussion   . Carcinomatosis ([place]) 01/17/2018  . Failure to thrive (0-17) 12/24/2017  . Protein-calorie malnutrition, severe 12/06/2017  . Small bowel mass 12/04/2017  . Protein-calorie malnutrition ([place]) 09/11/2017  . Weight loss 09/11/2017  . Diabetic neuropathy ([place]) 09/28/2015  . Hip pain 09/21/2014  . Muscle ache of extremity 06/12/2014  . Anxiety state 10/06/2013  . Bladder prolapse, female, acquired 08/03/2013  . Osteoporosis, unspecified 08/03/2013   . Nonspecific (abnormal) findings on radiological and other examination of biliary tract 04/17/2013  . Diabetes with neurologic complications ([place]) 52/77/8242  . HTN (hypertension) 09/18/2011  . Hyperlipidemia LDL goal <70 09/18/2011  . Glaucoma 09/18/2011    Past Surgical History:  Procedure Laterality Date  . ABDOMINAL HYSTERECTOMY    . CATARACT EXTRACTION Right 01/2010  . COLON SURGERY    . COLONOSCOPY    . EUS N/A 04/17/2013   Procedure: UPPER ENDOSCOPIC ULTRASOUND (EUS) LINEAR;  Surgeon: Milus Banister, MD;  Location: [facility] ENDOSCOPY;  Service: Endoscopy;  Laterality: N/A;  . GLAUCOMA SURGERY  2007  . IR CM INJ ANY COLONIC TUBE W/FLUORO  09/12/2018  . IR FLUORO GUIDE CV LINE RIGHT  01/17/2018  . IR GASTROSTOMY TUBE MOD SED  04/11/2018  . IR IMAGE GUIDED DRAINAGE PERCUT CATH  PERITONEAL RETROPERIT  01/09/2018  . IR REMOVAL TUN CV CATH W/O FL  06/19/2018  . IR SINUS/FIST TUBE CHK-NON GI  01/18/2018  . IR US GUIDE VASC ACCESS RIGHT  01/17/2018  . LAPAROSCOPIC SMALL BOWEL RESECTION  12/04/2017  . LAPAROSCOPIC SMALL BOWEL RESECTION N/A 12/04/2017   Procedure: LAPAROSCOPIC SMALL BOWEL RESECTION;  Surgeon: Stark Klein, MD;  Location: [facility];  Service: General;  Laterality: N/A;  . LAPAROSCOPY N/A 12/04/2017   Procedure: LAPAROSCOPY DIAGNOSTIC;  Surgeon: Stark Klein, MD;  Location: [place];  Service: General;  Laterality: N/A;  . LAPAROSCOPY N/A 12/27/2017  Procedure: DIAGNOSTIC LAPAROSCOPY/ LAPAROTOMY WITH SMALL BOWEL RESECTION TIMES TWO;  Surgeon: Stark Klein, MD;  Location: [place];  Service: General;  Laterality: N/A;  . MULTIPLE TOOTH EXTRACTIONS    . PICC LINE INSERTION  12/24/2017     OB History   None      Home Medications    Prior to Admission medications   Medication Sig Start Date End Date Taking? Authorizing Provider  acetaminophen (TYLENOL) 325 MG tablet Take 650 mg by mouth every 6 (six) hours as needed.    [provider]  apixaban (ELIQUIS) 2.5 MG TABS tablet  Take 2.5 mg by mouth 2 (two) times daily.    [provider]  Blood Glucose Monitoring Suppl (BLOOD GLUCOSE SYSTEM PAK) KIT Please dispense based on patient and insurance preference. Use as directed to monitor FSBS 1x daily. Dx: E11.9. 09/03/18   Delsa Grana, PA-C  CAPSAICIN EX Apply topically.    [provider]  cholecalciferol (VITAMIN D) 1000 units tablet Take 1,000 Units by mouth daily.    [provider]  Glucose Blood (BLOOD GLUCOSE TEST STRIPS) STRP Please dispense based on patient and insurance preference. Use as directed to monitor FSBS 1x daily. Dx: E11.9. 09/03/18   Delsa Grana, PA-C  insulin detemir (LEVEMIR) 100 UNIT/ML injection Inject 4 Units into the skin daily.     [provider]  Insulin Pen Needle (BD PEN NEEDLE MICRO U/F) 32G X 6 MM MISC Use with insulin pen ICD10:e11.43 09/16/18   Alycia Rossetti, MD  Lancets MISC Please dispense based on patient and insurance preference. Use as directed to monitor FSBS 1x daily. Dx: E11.9. 09/03/18   Delsa Grana, PA-C  ondansetron (ZOFRAN) 4 MG tablet Take 4 mg by mouth every 8 (eight) hours as needed for nausea or vomiting.    [provider]  pantoprazole (PROTONIX) 40 MG tablet Take 40 mg by mouth daily.    [provider]    Family History History reviewed. No pertinent family history.  Social History Social History   Tobacco Use  . Smoking status: Never Smoker  . Smokeless tobacco: Never Used  Substance Use Topics  . Alcohol use: No  . Drug use: No     Allergies   Patient has no known allergies.   Review of Systems Review of Systems  Gastrointestinal: Positive for vomiting.  All other systems reviewed and are negative.    Physical Exam Updated Vital Signs BP 129/80   Pulse 78   Temp 98.2 F (36.8 C)   Resp 17   Ht '5\' 3"'  (1.6 m)   Wt 43.3 kg   SpO2 97%   BMI 16.92 kg/m   Physical Exam  Constitutional: She appears well-developed.  thin  HENT:    Head: Normocephalic and atraumatic.  Cardiovascular: Normal rate and regular rhythm.  No murmur heard. Pulmonary/Chest: Effort normal and breath sounds normal. No respiratory distress.  Abdominal: Soft. There is no tenderness. There is no rebound and no guarding.  Gastrostomy tube in LUQ without local tenderness  Musculoskeletal: She exhibits no edema or tenderness.  Neurological: She is alert.  Disoriented to time.  MAE symmetrically.  Skin: Skin is warm and dry.  Psychiatric: She has a normal mood and affect. Her behavior is normal.  Nursing note and vitals reviewed.    ED Treatments / Results  Labs (all labs ordered are listed, but only abnormal results are displayed) Labs Reviewed  COMPREHENSIVE METABOLIC PANEL - Abnormal; Notable for the following  components:      Result Value   Glucose, Bld 112 (*)    All other components within normal limits  CBC WITH DIFFERENTIAL/PLATELET - Abnormal; Notable for the following components:   WBC 3.0 (*)    RBC 5.16 (*)    RDW 15.9 (*)    All other components within normal limits  LIPASE, BLOOD  TROPONIN I  URINALYSIS, ROUTINE W REFLEX MICROSCOPIC    EKG EKG Interpretation  Date/Time:  Wednesday September 25 2018 19:30:10 EDT Ventricular Rate:  76 PR Interval:    QRS Duration: 102 QT Interval:  375 QTC Calculation: 422 R Axis:   71 Text Interpretation:  Sinus rhythm Low voltage, extremity leads No significant change since last tracing Confirmed by Quintella Reichert [phone]) on 09/25/2018 7:51:53 PM   Radiology Ct Abdomen Pelvis W Contrast  Result Date: 09/25/2018 CLINICAL DATA:  Vomiting today after tube feeding. EXAM: CT ABDOMEN AND PELVIS WITH CONTRAST TECHNIQUE: Multidetector CT imaging of the abdomen and pelvis was performed using the standard protocol following bolus administration of intravenous contrast. CONTRAST:  135m ISOVUE-300 IOPAMIDOL (ISOVUE-300) INJECTION 61% COMPARISON:  01/26/2018 FINDINGS: Lower chest: Mild  dependent changes in the lung bases. Hepatobiliary: No focal liver abnormality is seen. No gallstones, gallbladder wall thickening, or biliary dilatation. Pancreas: Unremarkable. No pancreatic ductal dilatation or surrounding inflammatory changes. Spleen: Normal in size without focal abnormality. Adrenals/Urinary Tract: Adrenal glands are unremarkable. Kidneys are normal, without renal calculi, focal lesion, or hydronephrosis. Bladder is unremarkable. Stomach/Bowel: Gastrostomy tube with balloon in the stomach. Stomach is not abnormally distended and no wall thickening is appreciated. There is prominent fluid distention of proximal small bowel with evidence of fold thickening. There is a transition zone to decompressed small bowel in the right mid abdomen. No obstructing mass is identified. The colon is decompressed with scattered stool present. Changes are consistent with small bowel obstruction. Fold thickening may indicate coexisting or related enteritis. No pneumatosis or portal venous gas to suggest ischemia. Mesenteric vessels appear patent. Vascular/Lymphatic: Aortic atherosclerosis. No enlarged abdominal or pelvic lymph nodes. Reproductive: Status post hysterectomy. No adnexal masses. Vaginal pessary is present. Other: Small amount of free fluid in the abdomen and pelvis, likely ascites. No free air. Abdominal wall musculature appears intact. Musculoskeletal: Prominent degenerative changes in the spine. Mild lumbar scoliosis convex towards the left. No destructive bone lesions. IMPRESSION: 1. Small bowel obstruction with transition zone in the right mid abdomen. No obstructing mass is identified. Small bowel fold thickening suggesting associated enteritis. 2. Small amount of free fluid in the abdomen and pelvis, likely ascites. No free air. 3. Vaginal pessary. Aortic Atherosclerosis (ICD10-I70.0). Electronically Signed   By: WLucienne CapersM.D.   On: 09/25/2018 21:22    Procedures Procedures (including  critical care time)  Medications Ordered in ED Medications  0.45 % sodium chloride infusion ( Intravenous New Bag/Given 09/25/18 2307)  iopamidol (ISOVUE-300) 61 % injection 100 mL (100 mLs Intravenous Contrast Given 09/25/18 2107)     Initial Impression / Assessment and Plan / ED Course  I have reviewed the triage vital signs and the nursing notes.  Pertinent labs & imaging results that were available during my care of the patient were reviewed by me and considered in my medical decision making (see chart for details).     She with history of neuroendocrine tumor status post resection in January of this year here for evaluation of emesis times two. She is chronically ill appearing on examination with no acute abdominal tenderness.  CT abdomen and pelvis obtained, which is consistent with a small bowel obstruction. Discussed with Dr. Marlou Starks with general surgery, who recommends medicine admission with surgery consult. Recommends peg tube placed to straight drain. Discussed with Dr. Myna Hidalgo with the hospitalist service, who accepts the patient in transfer. Patient and nephew updated of findings of studies recommendation for admission and they are in agreement with treatment plan.  Final Clinical Impressions(s) / ED Diagnoses   Final diagnoses:  None    ED Discharge Orders    None       Quintella Reichert, MD 09/25/18 2352

## 2018-09-25 NOTE — ED Triage Notes (Signed)
Family member states vomited  x 2 episodes today after tube feeding, nurse visit today requested ED eval

## 2018-09-25 NOTE — ED Notes (Signed)
PEG tube attached to drainage bag and opened to gavity; small amount of pink tinged liquid draining from tube.

## 2018-09-25 NOTE — ED Notes (Signed)
Pt vomited >572ml yellow-brown liquid emesis. EDP notified.

## 2018-09-26 DIAGNOSIS — E1143 Type 2 diabetes mellitus with diabetic autonomic (poly)neuropathy: Secondary | ICD-10-CM

## 2018-09-26 DIAGNOSIS — K56609 Unspecified intestinal obstruction, unspecified as to partial versus complete obstruction: Principal | ICD-10-CM

## 2018-09-26 DIAGNOSIS — R627 Adult failure to thrive: Secondary | ICD-10-CM

## 2018-09-26 DIAGNOSIS — Z86718 Personal history of other venous thrombosis and embolism: Secondary | ICD-10-CM

## 2018-09-26 DIAGNOSIS — C800 Disseminated malignant neoplasm, unspecified: Secondary | ICD-10-CM

## 2018-09-26 LAB — CBC WITH DIFFERENTIAL/PLATELET
Abs Immature Granulocytes: 0.01 10*3/uL (ref 0.00–0.07)
BASOS ABS: 0 10*3/uL (ref 0.0–0.1)
Basophils Relative: 0 %
EOS ABS: 0 10*3/uL (ref 0.0–0.5)
EOS PCT: 1 %
HEMATOCRIT: 39.7 % (ref 36.0–46.0)
Hemoglobin: 12.4 g/dL (ref 12.0–15.0)
Immature Granulocytes: 0 %
Lymphocytes Relative: 17 %
Lymphs Abs: 0.9 10*3/uL (ref 0.7–4.0)
MCH: 26.6 pg (ref 26.0–34.0)
MCHC: 31.2 g/dL (ref 30.0–36.0)
MCV: 85 fL (ref 80.0–100.0)
Monocytes Absolute: 0.6 10*3/uL (ref 0.1–1.0)
Monocytes Relative: 10 %
NRBC: 0 % (ref 0.0–0.2)
Neutro Abs: 4 10*3/uL (ref 1.7–7.7)
Neutrophils Relative %: 72 %
Platelets: 230 10*3/uL (ref 150–400)
RBC: 4.67 MIL/uL (ref 3.87–5.11)
RDW: 16.1 % — AB (ref 11.5–15.5)
WBC: 5.6 10*3/uL (ref 4.0–10.5)

## 2018-09-26 LAB — URINALYSIS, ROUTINE W REFLEX MICROSCOPIC
BILIRUBIN URINE: NEGATIVE
Glucose, UA: NEGATIVE mg/dL
Ketones, ur: 5 mg/dL — AB
NITRITE: NEGATIVE
PH: 5 (ref 5.0–8.0)
Protein, ur: NEGATIVE mg/dL

## 2018-09-26 LAB — BASIC METABOLIC PANEL
Anion gap: 11 (ref 5–15)
BUN: 14 mg/dL (ref 8–23)
CO2: 27 mmol/L (ref 22–32)
Calcium: 9.3 mg/dL (ref 8.9–10.3)
Chloride: 103 mmol/L (ref 98–111)
Creatinine, Ser: 0.67 mg/dL (ref 0.44–1.00)
GFR calc Af Amer: >60 mL/min (ref >60)
GFR calc non Af Amer: >60 mL/min (ref >60)
Glucose, Bld: 103 mg/dL — ABNORMAL HIGH (ref 70–99)
Potassium: 3.8 mmol/L (ref 3.5–5.1)
Sodium: 141 mmol/L (ref 135–145)

## 2018-09-26 LAB — GLUCOSE, CAPILLARY
GLUCOSE-CAPILLARY: 104 mg/dL — AB (ref 70–99)
GLUCOSE-CAPILLARY: 162 mg/dL — AB (ref 70–99)
GLUCOSE-CAPILLARY: 71 mg/dL (ref 70–99)
GLUCOSE-CAPILLARY: 77 mg/dL (ref 70–99)
Glucose-Capillary: 122 mg/dL — ABNORMAL HIGH (ref 70–99)
Glucose-Capillary: 67 mg/dL — ABNORMAL LOW (ref 70–99)
Glucose-Capillary: 135 mg/dL — ABNORMAL HIGH (ref 70–99)
Glucose-Capillary: 67 mg/dL — ABNORMAL LOW (ref 70–99)

## 2018-09-26 LAB — APTT: aPTT: 30 seconds (ref 24–36)

## 2018-09-26 MED ORDER — DEXTROSE 50 % IV SOLN
INTRAVENOUS | Status: AC
  Administered 2018-09-26: 25 mL
  Filled 2018-09-26: qty 50

## 2018-09-26 MED ORDER — ONDANSETRON HCL 4 MG PO TABS
4.0000 mg | ORAL_TABLET | Freq: Four times a day (QID) | ORAL | Status: DC | PRN
  Administered 2018-10-01: 4 mg via ORAL
  Filled 2018-09-26: qty 1

## 2018-09-26 MED ORDER — MORPHINE SULFATE (PF) 2 MG/ML IV SOLN: 1.0000 mg | INTRAVENOUS | Status: DC | PRN

## 2018-09-26 MED ORDER — ACETAMINOPHEN 325 MG PO TABS: 650.0000 mg | ORAL_TABLET | Freq: Four times a day (QID) | ORAL | Status: DC | PRN

## 2018-09-26 MED ORDER — ONDANSETRON HCL 4 MG/2ML IJ SOLN
4.0000 mg | Freq: Four times a day (QID) | INTRAMUSCULAR | Status: DC | PRN
  Filled 2018-09-26: qty 2

## 2018-09-26 MED ORDER — ACETAMINOPHEN 650 MG RE SUPP: 650.0000 mg | Freq: Four times a day (QID) | RECTAL | Status: DC | PRN

## 2018-09-26 MED ORDER — DEXTROSE 5 % IV SOLN
INTRAVENOUS | Status: DC
  Administered 2018-09-26: via INTRAVENOUS

## 2018-09-26 MED ORDER — INSULIN ASPART 100 UNIT/ML ~~LOC~~ SOLN
0.0000 [IU] | SUBCUTANEOUS | Status: DC
  Administered 2018-09-29: 2 [IU] via SUBCUTANEOUS
  Administered 2018-09-29 – 2018-09-30 (×3): 1 [IU] via SUBCUTANEOUS

## 2018-09-26 MED ORDER — SODIUM CHLORIDE 0.45 % IV SOLN: INTRAVENOUS | Status: AC

## 2018-09-26 MED ORDER — DEXTROSE 50 % IV SOLN
INTRAVENOUS | Status: AC
  Administered 2018-09-26: 50 mL
  Filled 2018-09-26: qty 50

## 2018-09-26 MED ORDER — HEPARIN (PORCINE) IN NACL 100-0.45 UNIT/ML-% IJ SOLN
800.0000 [IU]/h | INTRAMUSCULAR | Status: DC
  Administered 2018-09-26: 800 [IU]/h via INTRAVENOUS
  Filled 2018-09-26: qty 250

## 2018-09-26 NOTE — Progress Notes (Signed)
ANTICOAGULATION CONSULT NOTE - Initial Consult  Pharmacy Consult for IV heparin Indication: DVT  No Known Allergies  Patient Measurements: Height: 5\' 3"  (160 cm) Weight: 127 lb 3.3 oz (57.7 kg) IBW/kg (Calculated) : 52.4 Heparin Dosing Weight: 57.7  Vital Signs: Temp: 98.8 F (37.1 C) (10/10 1540) Temp Source: Oral (10/10 1540) BP: 95/49 (10/10 1540) Pulse Rate: 73 (10/10 1540)  Labs: Recent Labs    09/25/18 1924 09/26/18 0614  HGB 14.1 12.4  HCT 43.8 39.7  PLT 238 230  CREATININE 0.57 0.67  TROPONINI <0.03  --     Estimated Creatinine Clearance: 42.5 mL/min (by C-G formula based on SCr of 0.67 mg/dL).   Medical History: Past Medical History:  Diagnosis Date  . Abdominal mass 10/2017   SMALL BOWEL  . Diabetes mellitus    type 2  . Dyspnea   . Failure to thrive (0-17) 12/24/2017  . Glaucoma   . Hyperlipidemia   . Hypertension   . Neuropathy   . Osteoporosis   . Wheezing     Medications:  Scheduled:  . insulin aspart  0-9 Units Subcutaneous Q4H   Infusions:  . sodium chloride      Assessment: 84 yo female currently NPO due to SBO. Patient was on apixaban 2.5mg  BID PTA for hx DVT with last dose reportedly 10/9 at 0800. To start IV heparin and continue while patient NPO and waiting for SBO to resolve. Note that patient will need a baseline aPTT level prior to starting IV heparin. Will need to check aPTT and heparin levels until the 2 correlate. Heparin levels will likely be elevated falsely due to apixaban. Baseline CBC good.   Goal of Therapy:  Heparin level 0.3-0.7 units/ml aPTT 66-102 seconds Monitor platelets by anticoagulation protocol: Yes   Plan:  1) After aPTT lab has been drawn (do not need to wait for result), start IV heparin at rate of 800 units/hr with NO bolus 2) Check aPTT and heparin levels 8 hours after start of IV heparin 3) Daily CBC   Adrian Saran, PharmD, BCPS Pager [phone] 09/26/2018 7:10 PM

## 2018-09-26 NOTE — Progress Notes (Signed)
[facility]  Patient Status: Active (receiving services up to time of hospitalization)  [facility] is providing the following services: RN, PT and OT  If patient discharges after hours, please call [phone].   Theresa Howe 09/26/2018, 4:05 PM

## 2018-09-26 NOTE — H&P (Signed)
History and Physical    [name]:[id number] DOB: 11-07-1931 DOA: 09/25/2018  PCP: [name], MD   Patient coming from: Home   Chief Complaint: Vomiting her tube feeds  HPI: Theresa Howe is a 84 y.o. 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  Patient had reportedly been in her usual state until she began to vomit her tube feeds yesterday.  She denies any significant pain, recently had a small amount of diarrhea which she describes as chronic, and denies any fevers, chills, shortness of breath, or chest pain.  She was brought into the ED for evaluation of this.  [facility] ED Course: Upon arrival to the ED, patient is found to be afebrile, saturating well on room air, and with vitals otherwise normal.  EKG features a sinus rhythm.  Chemistry panel is unremarkable and CBC is notable for a leukopenia, WBC 3000.  Troponin is undetectable.  CT of the abdomen and pelvis reveals small bowel obstruction with transition zone in the right mid abdomen, no obstructing mass identified, and no free air seen.  Surgery was consulted by the ED physician and recommended medical admission with bowel rest and gastrostomy tube to suction.  Review of Systems:  All other systems reviewed and apart from HPI, are negative.  Past Medical History:  Diagnosis Date  . Abdominal mass 10/2017   SMALL BOWEL  . Diabetes mellitus    type 2  . Dyspnea   . Failure to thrive (0-17) 12/24/2017  . Glaucoma   . Hyperlipidemia   . Hypertension   . Neuropathy   . Osteoporosis   . Wheezing     Past Surgical History:  Procedure Laterality Date  . ABDOMINAL HYSTERECTOMY    . CATARACT EXTRACTION Right 01/2010  . COLON SURGERY    . COLONOSCOPY    .  EUS N/A 04/17/2013   Procedure: UPPER ENDOSCOPIC ULTRASOUND (EUS) LINEAR;  Surgeon: Rachael Fee, MD;  Location: [facility];  Service: Endoscopy;  Laterality: N/A;  . GLAUCOMA SURGERY  2007  . IR CM INJ ANY COLONIC TUBE W/FLUORO  09/12/2018  . IR FLUORO GUIDE CV LINE RIGHT  01/17/2018  . IR GASTROSTOMY TUBE MOD SED  04/11/2018  . IR IMAGE GUIDED DRAINAGE PERCUT CATH  PERITONEAL RETROPERIT  01/09/2018  . IR REMOVAL TUN CV CATH W/O FL  06/19/2018  . IR SINUS/FIST TUBE CHK-NON GI  01/18/2018  . IR US GUIDE VASC ACCESS RIGHT  01/17/2018  . LAPAROSCOPIC SMALL BOWEL RESECTION  12/04/2017  . LAPAROSCOPIC SMALL BOWEL RESECTION N/A 12/04/2017   Procedure: LAPAROSCOPIC SMALL BOWEL RESECTION;  Surgeon: Almond Lint, MD;  Location: [facility] OR;  Service: General;  Laterality: N/A;  . LAPAROSCOPY N/A 12/04/2017   Procedure: LAPAROSCOPY DIAGNOSTIC;  Surgeon: Almond Lint, MD;  Location: [facility] OR;  Service: General;  Laterality: N/A;  . LAPAROSCOPY N/A 12/27/2017   Procedure: DIAGNOSTIC LAPAROSCOPY/ LAPAROTOMY WITH SMALL BOWEL RESECTION TIMES TWO;  Surgeon: Almond Lint, MD;  Location: [facility];  Service: General;  Laterality: N/A;  . MULTIPLE TOOTH EXTRACTIONS    . PICC LINE INSERTION  12/24/2017     reports that she has never smoked. She has never used smokeless tobacco. She reports that she does not drink alcohol or use drugs.  No Known Allergies  History reviewed. No  pertinent family history.   Prior to Admission medications   Medication Sig Start Date End Date Taking? Authorizing Provider  acetaminophen (TYLENOL) 325 MG tablet Take 650 mg by mouth every 6 (six) hours as needed.    [provider]  apixaban (ELIQUIS) 2.5 MG TABS tablet Take 2.5 mg by mouth 2 (two) times daily.    [provider]  Blood Glucose Monitoring Suppl (BLOOD GLUCOSE SYSTEM PAK) KIT Please dispense based on patient and insurance preference. Use as directed to monitor FSBS 1x daily. Dx: E11.9. 09/03/18   Danelle Berry,  PA-C  CAPSAICIN EX Apply topically.    [provider]  cholecalciferol (VITAMIN D) 1000 units tablet Take 1,000 Units by mouth daily.    [provider]  Glucose Blood (BLOOD GLUCOSE TEST STRIPS) STRP Please dispense based on patient and insurance preference. Use as directed to monitor FSBS 1x daily. Dx: E11.9. 09/03/18   Danelle Berry, PA-C  insulin detemir (LEVEMIR) 100 UNIT/ML injection Inject 4 Units into the skin daily.     [provider]  Insulin Pen Needle (BD PEN NEEDLE MICRO U/F) 32G X 6 MM MISC Use with insulin pen ICD10:e11.43 09/16/18   [name], MD  Lancets MISC Please dispense based on patient and insurance preference. Use as directed to monitor FSBS 1x daily. Dx: E11.9. 09/03/18   Danelle Berry, PA-C  ondansetron (ZOFRAN) 4 MG tablet Take 4 mg by mouth every 8 (eight) hours as needed for nausea or vomiting.    [provider]  pantoprazole (PROTONIX) 40 MG tablet Take 40 mg by mouth daily.    [provider]    Physical Exam: Vitals:   09/25/18 1810 09/25/18 2023 09/25/18 2236 09/26/18 0002  BP: 126/67 (!) 145/77 129/80 123/80  Pulse: 84 82 78 77  Resp: 16 18 17 16   Temp: 98.2 F (36.8 C)   98.8 F (37.1 C)  TempSrc:    Oral  SpO2: 97% 99% 97% 97%  Weight: 43.3 kg     Height: 5\' 3"  (1.6 m)       Constitutional: NAD, calm, cachectic  Eyes: PERTLA, lids and conjunctivae normal ENMT: Mucous membranes are moist. Posterior pharynx clear of any exudate or lesions.   Neck: normal, supple, no masses, no thyromegaly Respiratory: clear to auscultation bilaterally, no wheezing, no crackles. Normal respiratory effort.   Cardiovascular: S1 & S2 heard, regular rate and rhythm. No extremity edema.  Abdomen: No distension, no tenderness, soft. Rare high-pitched bowel sound.  Musculoskeletal: no clubbing / cyanosis. No joint deformity upper and lower extremities.    Skin: no significant rashes, lesions, ulcers. Warm, dry,  well-perfused. Neurologic: CN 2-12 grossly intact. Sensation intact. Moving all extremities.  Psychiatric: Alert and oriented to person, place, and situation. Very pleasant and cooperative.    Labs on Admission: I have personally reviewed following labs and imaging studies  CBC: Recent Labs  Lab 09/25/18 1924  WBC 3.0*  NEUTROABS 2.1  HGB 14.1  HCT 43.8  MCV 84.9  PLT 238   Basic Metabolic Panel: Recent Labs  Lab 09/25/18 1924  NA 138  K 3.7  CL 103  CO2 26  GLUCOSE 112*  BUN 13  CREATININE 0.57  CALCIUM 9.5   GFR: Estimated Creatinine Clearance: 35.1 mL/min (by C-G formula based on SCr of 0.57 mg/dL). Liver Function Tests: Recent Labs  Lab 09/25/18 1924  AST 27  ALT 25  ALKPHOS 65  BILITOT 0.6  PROT 6.6  ALBUMIN 3.6  Recent Labs  Lab 09/25/18 1924  LIPASE 39   No results for input(s): AMMONIA in the last 168 hours. Coagulation Profile: No results for input(s): INR, PROTIME in the last 168 hours. Cardiac Enzymes: Recent Labs  Lab 09/25/18 1924  TROPONINI <0.03   BNP (last 3 results) No results for input(s): PROBNP in the last 8760 hours. HbA1C: No results for input(s): HGBA1C in the last 72 hours. CBG: No results for input(s): GLUCAP in the last 168 hours. Lipid Profile: No results for input(s): CHOL, HDL, LDLCALC, TRIG, CHOLHDL, LDLDIRECT in the last 72 hours. Thyroid Function Tests: No results for input(s): TSH, T4TOTAL, FREET4, T3FREE, THYROIDAB in the last 72 hours. Anemia Panel: No results for input(s): VITAMINB12, FOLATE, FERRITIN, TIBC, IRON, RETICCTPCT in the last 72 hours. Urine analysis:    Component Value Date/Time   COLORURINE YELLOW 02/01/2018 1415   APPEARANCEUR HAZY (A) 02/01/2018 1415   LABSPEC 1.015 02/01/2018 1415   PHURINE 6.0 02/01/2018 1415   GLUCOSEU NEGATIVE 02/01/2018 1415   HGBUR NEGATIVE 02/01/2018 1415   BILIRUBINUR NEGATIVE 02/01/2018 1415   KETONESUR NEGATIVE 02/01/2018 1415   PROTEINUR NEGATIVE 02/01/2018  1415   NITRITE NEGATIVE 02/01/2018 1415   LEUKOCYTESUR TRACE (A) 02/01/2018 1415   Sepsis Labs: @LABRCNTIP (procalcitonin:4,lacticidven:4) )No results found for this or any previous visit (from the past 240 hour(s)).   Radiological Exams on Admission: Ct Abdomen Pelvis W Contrast  Result Date: 09/25/2018 CLINICAL DATA:  Vomiting today after tube feeding. EXAM: CT ABDOMEN AND PELVIS WITH CONTRAST TECHNIQUE: Multidetector CT imaging of the abdomen and pelvis was performed using the standard protocol following bolus administration of intravenous contrast. CONTRAST:  ISOVUE-300 IOPAMIDOL (ISOVUE-300) INJECTION 61% COMPARISON:  01/26/2018 FINDINGS: Lower chest: Mild dependent changes in the lung bases. Hepatobiliary: No focal liver abnormality is seen. No gallstones, gallbladder wall thickening, or biliary dilatation. Pancreas: Unremarkable. No pancreatic ductal dilatation or surrounding inflammatory changes. Spleen: Normal in size without focal abnormality. Adrenals/Urinary Tract: Adrenal glands are unremarkable. Kidneys are normal, without renal calculi, focal lesion, or hydronephrosis. Bladder is unremarkable. Stomach/Bowel: Gastrostomy tube with balloon in the stomach. Stomach is not abnormally distended and no wall thickening is appreciated. There is prominent fluid distention of proximal small bowel with evidence of fold thickening. There is a transition zone to decompressed small bowel in the right mid abdomen. No obstructing mass is identified. The colon is decompressed with scattered stool present. Changes are consistent with small bowel obstruction. Fold thickening may indicate coexisting or related enteritis. No pneumatosis or portal venous gas to suggest ischemia. Mesenteric vessels appear patent. Vascular/Lymphatic: Aortic atherosclerosis. No enlarged abdominal or pelvic lymph nodes. Reproductive: Status post hysterectomy. No adnexal masses. Vaginal pessary is present. Other: Small amount of  free fluid in the abdomen and pelvis, likely ascites. No free air. Abdominal wall musculature appears intact. Musculoskeletal: Prominent degenerative changes in the spine. Mild lumbar scoliosis convex towards the left. No destructive bone lesions. IMPRESSION: 1. Small bowel obstruction with transition zone in the right mid abdomen. No obstructing mass is identified. Small bowel fold thickening suggesting associated enteritis. 2. Small amount of free fluid in the abdomen and pelvis, likely ascites. No free air. 3. Vaginal pessary. Aortic Atherosclerosis (ICD10-I70.0). Electronically Signed   By: Burman Nieves M.D.   On: 09/25/2018 21:22    EKG: Independently reviewed. Sinus rhythm.   Assessment/Plan   1. SBO  - Presents with vomiting of tube feeds and is found to have SBO on CT  - Surgery is consulting and  much appreciated  - Continue bowel-rest, gastrostomy tube to suction, IVF hydration, analgesia and antiemetics as needed, serial exams   2. Carcinomatosis  - Low-grade, secondary to neuroendocrine tumor that was resected in January 2019  - PCP referred her last week for oncology consultation   3. Failure to thrive  - Patient has gastrostomy tube d/t FTT with poor oral intake  - Keep NPO for now and gastrostomy tube to suction until SBO resolves    4. History of DVT  - Diagnosed during hospitalization in January when she underwent  - No evidence for acute DVT or PE  - Eliquis held in light of SBO and potential need for surgery     DVT prophylaxis: Eliquis pta, SCD's  Code Status: Full  Family Communication: Discussed with patient  Consults called: Surgery Admission status: Inpatient     Briscoe Deutscher, MD [facility] Pager [phone]  If 7PM-7AM, please contact night-coverage [email or web address] Password [email or web address]  09/26/2018, 3:46 AM

## 2018-09-26 NOTE — Progress Notes (Signed)
PROGRESS NOTE  84yo F admitted this morning by Dr. Myna Hidalgo for SBO. She presented for vomiting her PEG tube feeds found to be chronically ill-appearing with some abdominal tenderness, stable vital signs. WBC 3k. CT showed SBO with right mid-abdomen transition point without mass or free air. She was admitted with plans for surgical evaluation.   I saw and examined the patient this morning. Afebrile without tachycardia, hypotension, or hypoxia. Chronically ill-appearing, nontoxic elderly female RRR Clear, nonlabored G tube placed to gravity foley bag, abdomen without peritonitis  A/P: - Continue IVF, NPO, gastrostomy tube to suction, prn analgesia and antiemetics.  - Continue holding eliquis with possibility of surgery. Will start heparin for history of DVT. - Per report surgery was consulted, though I do not see a consult note or mention of who was consulted specifically. I discussed with Dr. Lucia Gaskins who agrees to consult on the patient. I appreciate his recommendations. - Otherwise, per H&P from this morning.   Patrecia Pour, MD 09/26/2018, 6:46 PM

## 2018-09-26 NOTE — Progress Notes (Addendum)
Re:   Theresa Howe DOB:   06-Sep-1931 MRN:   [id number]  Chief Complaint Abdominal pain  ASSESEMENT AND PLAN: 1.  SBO - adhesions vs neuroendocrine tumor  Agree with plan of NPO, IVF.  Will check KUB in AM.  She could not tolerate the contrast with the initial CT scan.  If still obstructed tomorrow, would repeat SBO protocol  2.  Gastrostomy tube  Placed by IR - 04/11/2018  She is being fed some through this and she is taking some po.  3.  Neuroendocrine tumor resection with carcinomatosis  SB resection x 2  - 12/04/2017 - [facility]   Findings:  "obstructing small bowel mass tethered in the pelvis, large mesenteric mass, mesenteric and peritoneal implants, liver implants"   Path showed neuroendocrine tumor  History of SB resection for volvulus of anastomosis  - 12/27/2017 - [facility]   Path showed neuroendocrine tumor  Of note, it does not appear that she has seen oncology, but there is probably little to add. 4.  DM 5.  HTN 6.  Failure to thrive   Chief Complaint  Patient presents with  . Emesis   PHYSICIAN REQUESTING CONSULTATION:  Hazeline Junker, MD  HISTORY OF PRESENT ILLNESS: Theresa Howe is a 84 y.o. (DOB: 09/20/1931)  Thin AA female whose primary care physician is [place] [facility], Velna Hatchet, MD. Dr. Jarvis Newcomer said that surgery was consulted earlier today, but had left no note.   Her nephew, Tamsen Snider, came in the room towards the end of my interview.  Theresa Howe is not much help with her history.  After her operations in Dec 2018 and Jan 2019, she was in a nursing home until about 6 weeks ago.  She never saw oncology for the carcinoid tumor.  She was eating some and using the Gtube for feedings.  She had gained some weight.  She lives with her twin brother and Tamsen Snider.  She got sick about 2 days ago, when she could not keep anything down.  Her last BM was about 2 days ago.  She has no history of liver, pancreas, or colon disease.  CT scan of abdomen - 09/25/2018 - 1. Small  bowel obstruction with transition zone in the right mid abdomen. No obstructing mass is identified. Small bowel fold thickening suggesting associated enteritis.  2. Small amount of free fluid in the abdomen and pelvis, likely ascites. No free air.  3. Vaginal pessary.    Past Medical History:  Diagnosis Date  . Abdominal mass 10/2017   SMALL BOWEL  . Diabetes mellitus    type 2  . Dyspnea   . Failure to thrive (0-17) 12/24/2017  . Glaucoma   . Hyperlipidemia   . Hypertension   . Neuropathy   . Osteoporosis   . Wheezing       Past Surgical History:  Procedure Laterality Date  . ABDOMINAL HYSTERECTOMY    . CATARACT EXTRACTION Right 01/2010  . COLON SURGERY    . COLONOSCOPY    . EUS N/A 04/17/2013   Procedure: UPPER ENDOSCOPIC ULTRASOUND (EUS) LINEAR;  Surgeon: Rachael Fee, MD;  Location: [facility];  Service: Endoscopy;  Laterality: N/A;  . GLAUCOMA SURGERY  2007  . IR CM INJ ANY COLONIC TUBE W/FLUORO  09/12/2018  . IR FLUORO GUIDE CV LINE RIGHT  01/17/2018  . IR GASTROSTOMY TUBE MOD SED  04/11/2018  . IR IMAGE GUIDED DRAINAGE PERCUT CATH  PERITONEAL RETROPERIT  01/09/2018  . IR REMOVAL TUN CV  CATH W/O FL  06/19/2018  . IR SINUS/FIST TUBE CHK-NON GI  01/18/2018  . IR US GUIDE VASC ACCESS RIGHT  01/17/2018  . LAPAROSCOPIC SMALL BOWEL RESECTION  12/04/2017  . LAPAROSCOPIC SMALL BOWEL RESECTION N/A 12/04/2017   Procedure: LAPAROSCOPIC SMALL BOWEL RESECTION;  Surgeon: Almond Lint, MD;  Location: [facility];  Service: General;  Laterality: N/A;  . LAPAROSCOPY N/A 12/04/2017   Procedure: LAPAROSCOPY DIAGNOSTIC;  Surgeon: Almond Lint, MD;  Location: [facility];  Service: General;  Laterality: N/A;  . LAPAROSCOPY N/A 12/27/2017   Procedure: DIAGNOSTIC LAPAROSCOPY/ LAPAROTOMY WITH SMALL BOWEL RESECTION TIMES TWO;  Surgeon: Almond Lint, MD;  Location: [facility];  Service: General;  Laterality: N/A;  . MULTIPLE TOOTH EXTRACTIONS    . PICC LINE INSERTION  12/24/2017      Current  Facility-Administered Medications  Medication Dose Route Frequency Provider Last Rate Last Dose  . 0.45 % sodium chloride infusion   Intravenous Continuous Opyd, Lavone Neri, MD      . acetaminophen (TYLENOL) tablet 650 mg  650 mg Oral Q6H PRN Opyd, Lavone Neri, MD       Or  . acetaminophen (TYLENOL) suppository 650 mg  650 mg Rectal Q6H PRN Opyd, Lavone Neri, MD      . heparin ADULT infusion 100 units/mL (25000 units/246mL sodium chloride 0.45%)  800 Units/hr Intravenous Continuous Hessie Knows, RPH      . insulin aspart (novoLOG) injection 0-9 Units  0-9 Units Subcutaneous Q4H Opyd, Timothy S, MD      . morphine 2 MG/ML injection 1-2 mg  1-2 mg Intravenous Q4H PRN Opyd, Lavone Neri, MD      . ondansetron (ZOFRAN) tablet 4 mg  4 mg Oral Q6H PRN Opyd, Lavone Neri, MD       Or  . ondansetron (ZOFRAN) injection 4 mg  4 mg Intravenous Q6H PRN Opyd, Lavone Neri, MD         No Known Allergies  REVIEW OF SYSTEMS: Skin:  No history of rash.  No history of abnormal moles. Infection:  No history of hepatitis or HIV.  No history of MRSA. Neurologic:  No history of stroke.  No history of seizure.  No history of headaches. Cardiac:  HTN.   No history of heart disease.  No history of prior cardiac catheterization.  No history of seeing a cardiologist. Pulmonary:  Does not smoke cigarettes.  No asthma or bronchitis.  No OSA/CPAP.  Endocrine:  DM x 40 years Gastrointestinal:  See HPI Urologic:  No history of kidney stones.  No history of bladder infections. Musculoskeletal:  No history of joint or back disease. Hematologic:  No bleeding disorder.  No history of anemia.  Not anticoagulated. Psycho-social:  The patient is oriented.   The patient has no obvious psychologic or social impairment to understanding our conversation and plan.  SOCIAL and FAMILY HISTORY: Widowed. She lives with her twin brother and Tamsen Snider, her nephew.  Jodelle Red has the medical power of atty. She has no children.  PHYSICAL  EXAM: BP (!) 95/49   Pulse 73   Temp 98.8 F (37.1 C) (Oral)   Resp 16   Ht 5\' 3"  (1.6 m)   Wt 57.7 kg   SpO2 98%   BMI 22.53 kg/m   General: Thin AA F who is alert and pleasant. Skin:  Inspection and palpation - no mass or rash. Eyes:  Conjunctiva and lids unremarkable.            Pupils  are equal Ears, Nose, Mouth, and Throat:  Ears and nose unremarkable            Lips and teeth are unremarable. Neck: Supple. No mass, trachea midline.  No thyroid mass. Lymph Nodes:  No supraclavicular, cervical, or inguinal nodes. Lungs: Normal respiratory effort.  Clear to auscultation and symmetric breath sounds. Heart:  Palpation of the heart is normal.            Auscultation: RRR. No murmur or rub.  Abdomen: Soft. No mass. G tube in LUQ.  Lower midline scar.  I feel no hernia.  There is no mass. Rectal: Not done.  Musculoskeletal:  Good muscle strength and ROM  in upper and lower extremities.  Neurologic:  Grossly intact to motor and sensory function. Psychiatric: Normal judgement and insight. Behavior is normal.            Oriented to time, person, place.   DATA REVIEWED, COUNSELING AND COORDINATION OF CARE: Epic notes reviewed. Counseling and coordination of care exceeded more than 50% of the time spent with patient. Total time spent with patient and charting: 45 minutes  Ovidio Kin, MD,  [facility], [place] [address]  [address]   [place], [place]    [address] Phone:  [phone] FAX:  [phone]

## 2018-09-26 NOTE — Progress Notes (Signed)
Initial Nutrition Assessment  DOCUMENTATION CODES:   Severe malnutrition in context of chronic illness, Underweight  INTERVENTION:   If TF resumed: Recommend continuous infusion of Vital AF 1.2 @ 20 ml/hr via PEG, advancing by 10 ml every 8 hours to goal rate of 55 ml/hr.  This would provide 1584 kcal, 99g of protein and 1070 ml H2O.  Once patient not receiving IVF, recommend free water flushes of 200 ml TID to meet fluid needs.  NUTRITION DIAGNOSIS:   Severe Malnutrition related to cancer and cancer related treatments, chronic illness, dysphagia(SBO) as evidenced by percent weight loss, energy intake < or equal to 75% for > or equal to 1 month, severe fat depletion, severe muscle depletion.  GOAL:   Patient will meet greater than or equal to 90% of their needs  MONITOR:   Labs, Weight trends, Diet advancement, TF tolerance, I & O's  REASON FOR ASSESSMENT:   Malnutrition Screening Tool    ASSESSMENT:   84 y.o. 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  Patient had reportedly been in her usual state until she began to vomit her tube feeds yesterday.   Patient in room with no family at bedside. Pt unable to provide nutrition history other than she has not been eating that much PO. Mainly things like oatmeal but not much else. Pt was vomiting TF PTA. Pt states her nephew would have more information about what TF formula she was using at home but he is a principal at a school and won't be at bedside until later.  Per review of chart, pt with previous admissions at [facility] from December 2018 to February 2019. Pt received TPN at that time and underwent resection of neuroendocrine tumor. Pt was diagnosed with severe malnutrition at that time.  PEG was  placed after discharge in April 2019. Initially was recommended to use Glucerna(1.2 vs. 1.5 vs. Shakes?) in tube via bolus feeds QID. This caused diarrhea. Pt was ultimately switched to Vital 1.5 boluses in September 2019. Pt was tolerating this but at max was only able to administer 3 cartons daily (which provides 1065 kcal and 48g protein). Pt has not been meeting nutritional needs since receiving TPN in February. Pt still meets criteria for severe malnutrition.   Once tube feedings are resumed, would recommend initiation of continuous feeds of Vital AF 1.2. This formula will provide less carbohydrates (if blood sugar is an issue) and pt will be able to meet needs with less volume.  Per weight records, pt has lost 24 lb since 4/25 (20% wt loss x 5.5 months, significant for time frame).   Medications reviewed. Labs reviewed: CBGs: 77-104   NUTRITION - FOCUSED PHYSICAL EXAM:    Most Recent Value  Orbital Region  Moderate depletion  Upper Arm Region  Severe depletion  Thoracic and Lumbar Region  Unable to assess  Buccal Region  Moderate depletion  Temple Region  Severe depletion  Clavicle Bone Region  Severe depletion  Clavicle and Acromion Bone Region  Severe depletion  Scapular Bone Region  Severe depletion  Dorsal Hand  Severe depletion  Patellar Region  Severe depletion  Anterior Thigh Region  Unable to assess  Posterior Calf Region  Severe depletion  Edema (RD Assessment)  None       Diet Order:   Diet Order  Diet NPO time specified  Diet effective now              EDUCATION NEEDS:   No education needs have been identified at this time  Skin:  Skin Assessment: Reviewed RN Assessment  Last BM:  10/9  Height:   Ht Readings from Last 1 Encounters:  09/25/18 5\' 3"  (1.6 m)    Weight:   Wt Readings from Last 1 Encounters:  09/26/18 57.7 kg    Ideal Body Weight:  52.3 kg  BMI:  Body mass index is 22.53 kg/m.  Estimated Nutritional Needs:    Kcal:  1550-1750  Protein:  70-80g  Fluid:  1.7L/day  Clayton Bibles, MS, RD, LDN [facility] Pager: [phone] After Hours Pager: [phone]

## 2018-09-27 ENCOUNTER — Inpatient Hospital Stay (HOSPITAL_COMMUNITY): Payer: Medicare Other

## 2018-09-27 LAB — CBC
HEMATOCRIT: 34.5 % — AB (ref 36.0–46.0)
HEMOGLOBIN: 10.7 g/dL — AB (ref 12.0–15.0)
MCH: 26.7 pg (ref 26.0–34.0)
MCHC: 31 g/dL (ref 30.0–36.0)
MCV: 86 fL (ref 80.0–100.0)
Platelets: 194 10*3/uL (ref 150–400)
RBC: 4.01 MIL/uL (ref 3.87–5.11)
RDW: 16 % — ABNORMAL HIGH (ref 11.5–15.5)
WBC: 3.1 10*3/uL — AB (ref 4.0–10.5)
nRBC: 0 % (ref 0.0–0.2)

## 2018-09-27 LAB — GLUCOSE, CAPILLARY
GLUCOSE-CAPILLARY: 95 mg/dL (ref 70–99)
GLUCOSE-CAPILLARY: 94 mg/dL (ref 70–99)
GLUCOSE-CAPILLARY: 89 mg/dL (ref 70–99)
GLUCOSE-CAPILLARY: 109 mg/dL — AB (ref 70–99)
Glucose-Capillary: 104 mg/dL — ABNORMAL HIGH (ref 70–99)
Glucose-Capillary: 107 mg/dL — ABNORMAL HIGH (ref 70–99)

## 2018-09-27 LAB — BASIC METABOLIC PANEL
Anion gap: 7 (ref 5–15)
BUN: 16 mg/dL (ref 8–23)
CHLORIDE: 106 mmol/L (ref 98–111)
CO2: 28 mmol/L (ref 22–32)
CREATININE: 0.8 mg/dL (ref 0.44–1.00)
Calcium: 8.6 mg/dL — ABNORMAL LOW (ref 8.9–10.3)
GFR calc non Af Amer: >60 mL/min (ref >60)
GLUCOSE: 95 mg/dL (ref 70–99)
Potassium: 3.1 mmol/L — ABNORMAL LOW (ref 3.5–5.1)
Sodium: 141 mmol/L (ref 135–145)

## 2018-09-27 LAB — MAGNESIUM: Magnesium: 1.4 mg/dL — ABNORMAL LOW (ref 1.7–2.4)

## 2018-09-27 LAB — HEPARIN LEVEL (UNFRACTIONATED)
HEPARIN UNFRACTIONATED: 0.75 [IU]/mL — AB (ref 0.30–0.70)
Heparin Unfractionated: 0.73 IU/mL — ABNORMAL HIGH (ref 0.30–0.70)

## 2018-09-27 LAB — APTT: aPTT: 107 seconds — ABNORMAL HIGH (ref 24–36)

## 2018-09-27 MED ORDER — INFLUENZA VAC SPLIT HIGH-DOSE 0.5 ML IM SUSY
0.5000 mL | PREFILLED_SYRINGE | INTRAMUSCULAR | Status: DC
  Filled 2018-09-27: qty 0.5

## 2018-09-27 MED ORDER — MAGNESIUM SULFATE 2 GM/50ML IV SOLN
2.0000 g | Freq: Once | INTRAVENOUS | Status: AC
  Administered 2018-09-27: 2 g via INTRAVENOUS
  Filled 2018-09-27 (×2): qty 50

## 2018-09-27 MED ORDER — HEPARIN (PORCINE) IN NACL 100-0.45 UNIT/ML-% IJ SOLN
700.0000 [IU]/h | INTRAMUSCULAR | Status: DC
  Administered 2018-09-28 – 2018-09-29 (×2): 700 [IU]/h via INTRAVENOUS
  Filled 2018-09-27 (×3): qty 250

## 2018-09-27 MED ORDER — DIATRIZOATE MEGLUMINE & SODIUM 66-10 % PO SOLN
90.0000 mL | Freq: Once | ORAL | Status: AC
  Administered 2018-09-27: 90 mL via NASOGASTRIC
  Filled 2018-09-27: qty 90

## 2018-09-27 MED ORDER — DEXTROSE-NACL 5-0.9 % IV SOLN
INTRAVENOUS | Status: DC
  Administered 2018-09-27 – 2018-09-29 (×3): via INTRAVENOUS

## 2018-09-27 MED ORDER — POTASSIUM CHLORIDE 10 MEQ/100ML IV SOLN
10.0000 meq | INTRAVENOUS | Status: AC
  Administered 2018-09-27 (×5): 10 meq via INTRAVENOUS
  Filled 2018-09-27 (×5): qty 100

## 2018-09-27 NOTE — Progress Notes (Signed)
Patient ID: CELISA ORTLOFF, female   DOB: 07-04-1931, 84 y.o.   MRN: [id number]       Subjective: Pt with no complaints.  Denies abdominal pain.  No nausea.  g-tube to gravity with only 400cc documented since admit 36hrs ago.  No BM.  Unclear if she has had flatus.  Objective: Vital signs in last 24 hours: Temp:  [97.5 F (36.4 C)-98.8 F (37.1 C)] 97.5 F (36.4 C) (10/11 0443) Pulse Rate:  [59-77] 59 (10/11 0443) Resp:  [16-20] 16 (10/11 0443) BP: (94-97)/(49-54) 97/54 (10/11 0443) SpO2:  [95 %-98 %] 97 % (10/11 0443) Last BM Date: 09/25/18  Intake/Output from previous day: 10/10 0701 - 10/11 0700 In: 534.8 [I.V.:534.8] Out: 450 [Urine:250; Drains:200] Intake/Output this shift: No intake/output data recorded.  PE: Heart: regular Lungs: CTAB Abd: soft, NT, ND, g-tube to gravity with no current output  Lab Results:  Recent Labs    09/26/18 0614 09/27/18 0605  WBC 5.6 3.1*  HGB 12.4 10.7*  HCT 39.7 34.5*  PLT 230 194   BMET Recent Labs    09/26/18 0614 09/27/18 0605  NA 141 141  K 3.8 3.1*  CL 103 106  CO2 27 28  GLUCOSE 103* 95  BUN 14 16  CREATININE 0.67 0.80  CALCIUM 9.3 8.6*   PT/INR No results for input(s): LABPROT, INR in the last 72 hours. CMP     Component Value Date/Time   NA 141 09/27/2018 0605   K 3.1 (L) 09/27/2018 0605   CL 106 09/27/2018 0605   CO2 28 09/27/2018 0605   GLUCOSE 95 09/27/2018 0605   BUN 16 09/27/2018 0605   CREATININE 0.80 09/27/2018 0605   CREATININE 0.88 09/03/2018 1519   CALCIUM 8.6 (L) 09/27/2018 0605   PROT 6.6 09/25/2018 1924   ALBUMIN 3.6 09/25/2018 1924   AST 27 09/25/2018 1924   ALT 25 09/25/2018 1924   ALKPHOS 65 09/25/2018 1924   BILITOT 0.6 09/25/2018 1924   GFRNONAA >60 09/27/2018 0605   GFRNONAA 60 09/03/2018 1519   GFRAA >60 09/27/2018 0605   GFRAA 69 09/03/2018 1519   Lipase     Component Value Date/Time   LIPASE 39 09/25/2018 1924       Studies/Results: Ct Abdomen Pelvis W  Contrast  Result Date: 09/25/2018 CLINICAL DATA:  Vomiting today after tube feeding. EXAM: CT ABDOMEN AND PELVIS WITH CONTRAST TECHNIQUE: Multidetector CT imaging of the abdomen and pelvis was performed using the standard protocol following bolus administration of intravenous contrast. CONTRAST:  ISOVUE-300 IOPAMIDOL (ISOVUE-300) INJECTION 61% COMPARISON:  01/26/2018 FINDINGS: Lower chest: Mild dependent changes in the lung bases. Hepatobiliary: No focal liver abnormality is seen. No gallstones, gallbladder wall thickening, or biliary dilatation. Pancreas: Unremarkable. No pancreatic ductal dilatation or surrounding inflammatory changes. Spleen: Normal in size without focal abnormality. Adrenals/Urinary Tract: Adrenal glands are unremarkable. Kidneys are normal, without renal calculi, focal lesion, or hydronephrosis. Bladder is unremarkable. Stomach/Bowel: Gastrostomy tube with balloon in the stomach. Stomach is not abnormally distended and no wall thickening is appreciated. There is prominent fluid distention of proximal small bowel with evidence of fold thickening. There is a transition zone to decompressed small bowel in the right mid abdomen. No obstructing mass is identified. The colon is decompressed with scattered stool present. Changes are consistent with small bowel obstruction. Fold thickening may indicate coexisting or related enteritis. No pneumatosis or portal venous gas to suggest ischemia. Mesenteric vessels appear patent. Vascular/Lymphatic: Aortic atherosclerosis. No enlarged abdominal or pelvic lymph  nodes. Reproductive: Status post hysterectomy. No adnexal masses. Vaginal pessary is present. Other: Small amount of free fluid in the abdomen and pelvis, likely ascites. No free air. Abdominal wall musculature appears intact. Musculoskeletal: Prominent degenerative changes in the spine. Mild lumbar scoliosis convex towards the left. No destructive bone lesions. IMPRESSION: 1. Small bowel  obstruction with transition zone in the right mid abdomen. No obstructing mass is identified. Small bowel fold thickening suggesting associated enteritis. 2. Small amount of free fluid in the abdomen and pelvis, likely ascites. No free air. 3. Vaginal pessary. Aortic Atherosclerosis (ICD10-I70.0). Electronically Signed   By: Burman Nieves M.D.   On: 09/25/2018 21:22    Anti-infectives: Anti-infectives (From admission, onward)   None       Assessment/Plan SBO, history of neuroendocrine tumor with carcinomatosis  -s/p 2 ex laps for bowel resections by Dr. Donell Beers for the above. -CT shows dilated small bowel despite patient being nondistended.  We will proceed with the SBO protocol through her g-tube -mobilize patient TID -IS for pulm toileting. -if patient were to not open up and looking to possibly need an operation, she would need a palliative care consult prior to this to determine her goals of care given her current medical status and difficulty of tolerating her previous operations.  H/o DVT HTN HLD FTT DM PCM  FEN - NPO/g-tube to gravity VTE - SCDs/heparin gtt (prior DVT) ID - none   LOS: 2 days    Letha Cape , [facility] 09/27/2018, 9:28 AM Pager: [phone]

## 2018-09-27 NOTE — Progress Notes (Signed)
ANTICOAGULATION CONSULT NOTE - Follow Up Consult  Pharmacy Consult for heparin Indication: hx DVT (home Eliquis on hold)  No Known Allergies  Patient Measurements: Height: 5\' 3"  (160 cm) Weight: 127 lb 3.3 oz (57.7 kg) IBW/kg (Calculated) : 52.4 Heparin Dosing Weight: 43 kg  Vital Signs: Temp: 97.5 F (36.4 C) (10/11 0443) Temp Source: Oral (10/11 0443) BP: 97/54 (10/11 0443) Pulse Rate: 59 (10/11 0443)  Labs: Recent Labs    09/25/18 1924 09/26/18 0614 09/26/18 1942 09/27/18 0605  HGB 14.1 12.4  --  10.7*  HCT 43.8 39.7  --  34.5*  PLT 238 230  --  194  APTT  --   --  30  --   HEPARINUNFRC  --   --   --  0.73*  CREATININE 0.57 0.67  --  0.80  TROPONINI <0.03  --   --   --     Estimated Creatinine Clearance: 42.5 mL/min (by C-G formula based on SCr of 0.8 mg/dL).   Medications:  - on Eliquis 2.5 mg bid PTA (last dose taken on 10/9 at 0800)  Assessment: Patient is an 84 y.o F with hx DVT on Eliquis PTA,  neuroendocrine tumor (s/p resection in Jan 2019) and gastrostomy tube, presented to th ED on 09/25/18 with c/o n/v after tube feeding.  Abd CT on 10/9 showed SOB.  Home Eliquis placed on hold and patient was transitioned to heparin drip while she is NPO.  Today, 09/27/2018: - heparin level and aPTT slightly elevated at 0.73 and 107 secs, respectively.  Since levels correlate, will monitor heparin using heparin level - hgb down 10.7, plts 194 - per RN, no bleeding noted  Goal of Therapy:  Heparin level 0.3-0.7 units/ml aPTT 66-102 seconds Monitor platelets by anticoagulation protocol: Yes   Plan:  - Decrease heparin drip to 750 units/hr - check 8 hr heparin level - monitor for s/s bleeding  Samba Cumba P 09/27/2018,7:39 AM

## 2018-09-27 NOTE — Progress Notes (Signed)
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  CT imaging showed small bowel obstruction. General surgery following.  Assessment & Plan:   Principal Problem:   SBO (small bowel obstruction) (HCC) Active Problems:   Diabetes with neurologic complications (HCC)   Adult failure to thrive   Carcinomatosis (HCC)   History of DVT (deep vein thrombosis)   SBO: Continue as per protocol.  General surgery following.  Patient denies any abdominal pain, nausea or vomiting.  Plan is to do a KUB today.  Denies any bowel movement or passing gas.  Continue n.p.o.  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  She will  follow-up with oncology as an outpatient as per her PCP.  Failure to thrive: Status post gastrostomy tube with tube feeding.  History of DVT: On Eliquis at home. Eliquis has  Been on hold for SBO. and potential need of surgery and she is on heparin drip.  Hypokalemia/hypomagnesemia: Being supplemented.   DVT prophylaxis: Heparin IV Code Status: Full Family Communication: None present at the bedside Disposition Plan: Likely home after clearance from surgery and resolution of small bowel obstruction   Consultants: General surgery  Procedures: None  Antimicrobials: None  Subjective: Patient seen and examined the bedside this morning.  Denies any nausea, vomiting or abdominal pain.  Has not had a bowel movement or passing gas.  Objective: Vitals:   09/26/18 0437 09/26/18 1540 09/26/18 2009 09/27/18 0443  BP:  (!) 95/49 (!) 94/52 (!) 97/54  Pulse:  73 77 (!) 59  Resp:  16 20  16   Temp:  98.8 F (37.1 C) 97.8 F (36.6 C) (!) 97.5 F (36.4 C)  TempSrc:  Oral Oral Oral  SpO2:  98% 95% 97%  Weight: 57.7 kg     Height:        Intake/Output Summary (Last 24 hours) at 09/27/2018 1201 Last data filed at 09/27/2018 0600 Gross per 24 hour  Intake 534.81 ml  Output 450 ml  Net 84.81 ml   Filed Weights   09/25/18 1810 09/26/18 0437  Weight: 43.3 kg 57.7 kg    Examination:  General exam: Appears calm and comfortable ,thin and debilitated elderly female HEENT:PERRL,Oral mucosa moist, Ear/Nose normal on gross exam Respiratory system: Bilateral equal air entry, normal vesicular breath sounds, no wheezes or crackles  Cardiovascular system: S1 & S2 heard, RRR. No JVD, murmurs, rubs, gallops or clicks. No pedal edema. Gastrointestinal system: Abdomen is nondistended, soft and nontender. No organomegaly or masses felt. Normal bowel sounds heard.G tube Central nervous system: Alert and oriented. No focal neurological deficits. Extremities: No edema, no clubbing ,no cyanosis, distal peripheral pulses palpable. Skin: No rashes, lesions or ulcers,no icterus ,no pallor   Data Reviewed: I have personally reviewed following labs and imaging studies  CBC: Recent Labs  Lab 09/25/18 1924 09/26/18 0614 09/27/18 0605  WBC 3.0* 5.6 3.1*  NEUTROABS 2.1 4.0  --   HGB 14.1 12.4 10.7*  HCT 43.8 39.7 34.5*  MCV 84.9 85.0 86.0  PLT 238 230 194   Basic Metabolic Panel: Recent Labs  Lab 09/25/18 1924 09/26/18 0614 09/27/18 0605  NA 138 141 141  K 3.7 3.8 3.1*  CL 103 103 106  CO2 26 27 28   GLUCOSE 112* 103* 95  BUN 13 14 16   CREATININE 0.57 0.67 0.80  CALCIUM 9.5 9.3 8.6*  MG  --   --  1.4*   GFR: Estimated Creatinine Clearance: 42.5 mL/min (by C-G formula based on SCr of 0.8 mg/dL). Liver Function Tests: Recent Labs  Lab 09/25/18 1924  AST 27  ALT 25  ALKPHOS 65  BILITOT 0.6  PROT 6.6  ALBUMIN 3.6   Recent Labs  Lab 09/25/18 1924  LIPASE 39    No results for input(s): AMMONIA in the last 168 hours. Coagulation Profile: No results for input(s): INR, PROTIME in the last 168 hours. Cardiac Enzymes: Recent Labs  Lab 09/25/18 1924  TROPONINI <0.03   BNP (last 3 results) No results for input(s): PROBNP in the last 8760 hours. HbA1C: No results for input(s): HGBA1C in the last 72 hours. CBG: Recent Labs  Lab 09/26/18 2324 09/26/18 2344 09/27/18 0410 09/27/18 0726 09/27/18 1116  GLUCAP 67* 162* 95 94 89   Lipid Profile: No results for input(s): CHOL, HDL, LDLCALC, TRIG, CHOLHDL, LDLDIRECT in the last 72 hours. Thyroid Function Tests: No results for input(s): TSH, T4TOTAL, FREET4, T3FREE, THYROIDAB in the last 72 hours. Anemia Panel: No results for input(s): VITAMINB12, FOLATE, FERRITIN, TIBC, IRON, RETICCTPCT in the last 72 hours. Sepsis Labs: No results for input(s): PROCALCITON, LATICACIDVEN in the last 168 hours.  No results found for this or any previous visit (from the past 240 hour(s)).       Radiology Studies: Ct Abdomen Pelvis W Contrast  Result Date: 09/25/2018 CLINICAL DATA:  Vomiting today after tube feeding. EXAM: CT ABDOMEN AND PELVIS WITH CONTRAST TECHNIQUE: Multidetector CT imaging of the abdomen and pelvis was performed using the standard protocol following bolus administration of intravenous contrast. CONTRAST:  ISOVUE-300 IOPAMIDOL (ISOVUE-300) INJECTION 61% COMPARISON:  01/26/2018 FINDINGS: Lower chest: Mild dependent changes in the lung bases. Hepatobiliary: No focal liver abnormality is seen. No gallstones, gallbladder wall thickening, or biliary dilatation. Pancreas: Unremarkable. No pancreatic ductal dilatation or surrounding inflammatory changes. Spleen: Normal in size without focal abnormality. Adrenals/Urinary Tract: Adrenal glands are unremarkable. Kidneys are normal, without renal calculi, focal lesion, or hydronephrosis. Bladder is unremarkable. Stomach/Bowel: Gastrostomy tube with  balloon in the stomach. Stomach is not abnormally distended and no wall thickening is appreciated. There is prominent fluid distention of proximal small bowel with evidence of fold thickening. There is a transition zone to decompressed small bowel in the right mid abdomen. No obstructing mass is identified. The colon is decompressed with scattered stool present. Changes are consistent with small bowel obstruction. Fold thickening may indicate coexisting or related enteritis. No pneumatosis or portal venous gas to suggest ischemia. Mesenteric vessels appear patent. Vascular/Lymphatic: Aortic atherosclerosis. No enlarged abdominal or pelvic lymph nodes. Reproductive: Status post hysterectomy. No adnexal masses. Vaginal pessary is present. Other: Small amount of free fluid in the abdomen and pelvis, likely ascites. No free air. Abdominal wall musculature appears intact. Musculoskeletal: Prominent degenerative changes in the spine. Mild lumbar scoliosis convex towards the left. No destructive bone lesions. IMPRESSION: 1. Small bowel obstruction with transition zone in the right mid abdomen. No obstructing mass is identified. Small bowel fold thickening suggesting associated enteritis. 2. Small amount of free fluid in the abdomen and pelvis, likely ascites. No free air. 3. Vaginal pessary. Aortic Atherosclerosis (ICD10-I70.0). Electronically Signed   By: Burman Nieves M.D.   On: 09/25/2018 21:22  Scheduled Meds: . [START ON 09/28/2018] Influenza vac split quadrivalent PF  0.5 mL Intramuscular Tomorrow-1000  . insulin aspart  0-9 Units Subcutaneous Q4H   Continuous Infusions: . dextrose 75 mL/hr at 09/27/18 0600  . heparin 750 Units/hr (09/27/18 0815)  . potassium chloride 10 mEq (09/27/18 1156)     LOS: 2 days    Time spent: 25 mins.More than 50% of that time was spent in counseling and/or coordination of care.      Burnadette Pop, MD [facility] Pager [phone]  If  7PM-7AM, please contact night-coverage [email or web address] Password [email or web address] 09/27/2018, 12:01 PM

## 2018-09-27 NOTE — Progress Notes (Signed)
Brief pharmacy anti-coagulation note:  For full details please see note from [facility] from earlier today  Assessment and Plan:  -Heparin level 0.75, supra-therapeutic on 750 units/hr -Decrease heparin to 700 units/hr -no bleeding reported per RN -Recheck heparin level in 8 hours -Monitor for s/s bleeding  Dolly Rias RPh 09/27/2018, 4:18 PM Pager [phone]

## 2018-09-28 LAB — GLUCOSE, CAPILLARY
GLUCOSE-CAPILLARY: 102 mg/dL — AB (ref 70–99)
GLUCOSE-CAPILLARY: 87 mg/dL (ref 70–99)
Glucose-Capillary: 105 mg/dL — ABNORMAL HIGH (ref 70–99)
Glucose-Capillary: 82 mg/dL (ref 70–99)
Glucose-Capillary: 111 mg/dL — ABNORMAL HIGH (ref 70–99)

## 2018-09-28 LAB — BASIC METABOLIC PANEL
Anion gap: 6 (ref 5–15)
BUN: 9 mg/dL (ref 8–23)
CHLORIDE: 110 mmol/L (ref 98–111)
CO2: 24 mmol/L (ref 22–32)
CREATININE: 0.64 mg/dL (ref 0.44–1.00)
Calcium: 8.2 mg/dL — ABNORMAL LOW (ref 8.9–10.3)
GFR calc Af Amer: >60 mL/min (ref >60)
GFR calc non Af Amer: >60 mL/min (ref >60)
GLUCOSE: 131 mg/dL — AB (ref 70–99)
Potassium: 3.4 mmol/L — ABNORMAL LOW (ref 3.5–5.1)
Sodium: 140 mmol/L (ref 135–145)

## 2018-09-28 LAB — HEPARIN LEVEL (UNFRACTIONATED)
HEPARIN UNFRACTIONATED: 0.45 [IU]/mL (ref 0.30–0.70)
HEPARIN UNFRACTIONATED: 0.51 [IU]/mL (ref 0.30–0.70)
Heparin Unfractionated: 0.5 IU/mL (ref 0.30–0.70)

## 2018-09-28 LAB — CBC
HCT: 34.2 % — ABNORMAL LOW (ref 36.0–46.0)
Hemoglobin: 10.4 g/dL — ABNORMAL LOW (ref 12.0–15.0)
MCH: 27.1 pg (ref 26.0–34.0)
MCHC: 30.4 g/dL (ref 30.0–36.0)
MCV: 89.1 fL (ref 80.0–100.0)
Platelets: 165 10*3/uL (ref 150–400)
RBC: 3.84 MIL/uL — AB (ref 3.87–5.11)
RDW: 16.2 % — ABNORMAL HIGH (ref 11.5–15.5)
WBC: 2.9 10*3/uL — ABNORMAL LOW (ref 4.0–10.5)
nRBC: 0 % (ref 0.0–0.2)

## 2018-09-28 LAB — MAGNESIUM: Magnesium: 1.9 mg/dL (ref 1.7–2.4)

## 2018-09-28 MED ORDER — POTASSIUM CHLORIDE 10 MEQ/100ML IV SOLN
10.0000 meq | INTRAVENOUS | Status: AC
  Administered 2018-09-28 (×4): 10 meq via INTRAVENOUS
  Filled 2018-09-28 (×4): qty 100

## 2018-09-28 NOTE — Progress Notes (Signed)
ANTICOAGULATION CONSULT NOTE - Follow Up Consult  Pharmacy Consult for Heparin Indication: hx DVT (home Eliquis on hold)  No Known Allergies  Patient Measurements: Height: 5\' 3"  (160 cm) Weight: 127 lb 3.3 oz (57.7 kg) IBW/kg (Calculated) : 52.4 Heparin Dosing Weight:   Vital Signs: Temp: 98.5 F (36.9 C) (10/11 2007) Temp Source: Oral (10/11 2007) BP: 104/58 (10/11 2007) Pulse Rate: 59 (10/11 2007)  Labs: Recent Labs    09/25/18 1924 09/26/18 0614 09/26/18 1942 09/27/18 0605 09/27/18 1540 09/28/18 0042  HGB 14.1 12.4  --  10.7*  --  10.4*  HCT 43.8 39.7  --  34.5*  --  34.2*  PLT 238 230  --  194  --  165  APTT  --   --  30 107*  --   --   HEPARINUNFRC  --   --   --  0.73* 0.75* 0.51  CREATININE 0.57 0.67  --  0.80  --  0.64  TROPONINI <0.03  --   --   --   --   --     Estimated Creatinine Clearance: 42.5 mL/min (by C-G formula based on SCr of 0.64 mg/dL).   Medications:  Infusions:  . dextrose 5 % and 0.9% NaCl 75 mL/hr at 09/28/18 0210  . heparin 700 Units/hr (09/27/18 1638)    Assessment: Patient with heparin level at goal.  No heparin issues noted.  Goal of Therapy:  Heparin level 0.3-0.7 units/ml Monitor platelets by anticoagulation protocol: Yes   Plan:  Continue heparin drip at current rate Recheck level at 0800  Tyler Deis, [place] Crowford 09/28/2018,3:23 AM

## 2018-09-28 NOTE — Progress Notes (Signed)
PT Note  Patient Details Name: Theresa Howe MRN: [id number] DOB: 14-Sep-1931   Chart reviewed and spoke to patient at bedside. She stated she has been walking to [employer] with staff (per pt" only because they tell me to call them to walk with me") . She stated she walked in the hallway yesterday without any difficulties. She likes to keep moving to stay strong. She stated she doesn't need PT at this time. Will sign off.   Thanks  Clide Dales 09/28/2018, 1:20 PM  Clide Dales, PT Acute Rehabilitation Services Pager: [phone] Office: [phone] 09/28/2018

## 2018-09-28 NOTE — Progress Notes (Signed)
ANTICOAGULATION CONSULT NOTE - Follow Up Consult  Pharmacy Consult for heparin Indication: hx DVT (home Eliquis on hold)  No Known Allergies  Patient Measurements: Height: 5\' 3"  (160 cm) Weight: 126 lb 1.7 oz (57.2 kg) IBW/kg (Calculated) : 52.4 Heparin Dosing Weight: 43 kg  Vital Signs: Temp: 98.6 F (37 C) (10/12 1551) Temp Source: Oral (10/12 1551) BP: 115/75 (10/12 1551) Pulse Rate: 69 (10/12 1551)  Labs: Recent Labs    09/25/18 1924 09/26/18 0614 09/26/18 1942 09/27/18 0605  09/28/18 0042 09/28/18 0634 09/28/18 1646  HGB 14.1 12.4  --  10.7*  --  10.4*  --   --   HCT 43.8 39.7  --  34.5*  --  34.2*  --   --   PLT 238 230  --  194  --  165  --   --   APTT  --   --  30 107*  --   --   --   --   HEPARINUNFRC  --   --   --  0.73*   < > 0.51 0.50 0.45  CREATININE 0.57 0.67  --  0.80  --  0.64  --   --   TROPONINI <0.03  --   --   --   --   --   --   --    < > = values in this interval not displayed.    Estimated Creatinine Clearance: 42.5 mL/min (by C-G formula based on SCr of 0.64 mg/dL).   Medications:  - on Eliquis 2.5 mg bid PTA (last dose taken on 10/9 at 0800)  Assessment: Patient is an 84 y.o F with hx DVT on Eliquis PTA,  neuroendocrine tumor (s/p resection in Jan 2019) and gastrostomy tube, presented to th ED on 09/25/18 with c/o n/v after tube feeding.  Abd CT on 10/9 showed SOB.  Home Eliquis placed on hold and patient was transitioned to heparin drip while she is NPO.   10/11: Heparin level and aPTT correlated - monitor using only Heparin levels. Heparin rate reduced to 700 units/hr in pm for level 0.75 units/ml, following confirmatory level in therapeutic range  Today, 09/28/2018: - night RN had stopped Heparin ~ 5:15am (stating she saw no current order) - recounted to am RN during hand-off. Heparin order current for 700 units/hr.  Night RN did not document/chart stopping Heparin - Heparin level due at 0800 drawn at 0630 (early) with result 0.5  units/ml (Heparin off ~ 75 minutes at that point).  Level in range, after discussion with am RN - Heparin resumed at 700 units/hr. - Heparin level 1646 = 0.45 units/ml, mid-therapeutic range  Goal of Therapy:  Heparin level 0.3-0.7 units/ml aPTT 66-102 seconds Monitor platelets by anticoagulation protocol: Yes   Plan:   Continue Heparin at 700 units/hr  Daily CBC, Heparin level   Minda Ditto PharmD Pager [phone] 09/28/2018, 5:47 PM

## 2018-09-28 NOTE — Progress Notes (Signed)
PROGRESS NOTE    Theresa Howe  DGU:[id number] DOB: [birth year] DOA: 09/25/2018 PCP: [name], MD   Brief Narrative: Patient is 84 year old female with past medical history of diabetes mellitus, hypertension, neuroendocrine tumor status post resection with low-grade carcinomatosis, referral to thrive with gastrostomy tube who presented with complaints of intolerance of her tube feeds.  CT imaging showed small bowel obstruction. General surgery following.  Assessment & Plan:   Principal Problem:   SBO (small bowel obstruction) (HCC) Active Problems:   Diabetes with neurologic complications (HCC)   Adult failure to thrive   Carcinomatosis (HCC)   History of DVT (deep vein thrombosis)   SBO: SBO protocol showed contrast in colon.  General surgery following. Clamped G tube and plan for initiating sips .Patient denies any abdominal pain, nausea or vomiting.    Denies any bowel movement or passing gas. Encourage ambulation.  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  She will  follow-up with oncology as an outpatient as per her PCP.  Failure to thrive: Status post gastrostomy tube with tube feeding.  History of DVT: On Eliquis at home. Eliquis has  Been on hold for SBO. and potential need of surgery and she is on heparin drip.  Hypokalemia/hypomagnesemia: Supplemented.   DVT prophylaxis: Heparin IV Code Status: Full Family Communication: None present at the bedside Disposition Plan: Likely home after clearance from surgery and resolution of small bowel obstruction   Consultants: General surgery  Procedures: None  Antimicrobials: None  Subjective: Patient seen and examined the bedside this morning.  Denies any nausea, vomiting or abdominal pain.  Has not had a bowel movement or passing gas.G tube clamped.  Patient anticipating that she will be able to avoid surgery.  Objective: Vitals:   09/27/18 1345 09/27/18  2007 09/28/18 0500 09/28/18 0522  BP: (!) 107/58 (!) 104/58  (!) 100/57  Pulse: 63 (!) 59  (!) 51  Resp: 17 18  15   Temp: 98.5 F (36.9 C) 98.5 F (36.9 C)  97.8 F (36.6 C)  TempSrc: Oral Oral  Oral  SpO2: 96% 96%  100%  Weight:   57.2 kg   Height:        Intake/Output Summary (Last 24 hours) at 09/28/2018 1147 Last data filed at 09/28/2018 0703 Gross per 24 hour  Intake 992.28 ml  Output 50 ml  Net 942.28 ml   Filed Weights   09/25/18 1810 09/26/18 0437 09/28/18 0500  Weight: 43.3 kg 57.7 kg 57.2 kg    Examination:  General exam: Appears calm and comfortable ,thin and debilitated elderly female HEENT:PERRL,Oral mucosa moist, Ear/Nose normal on gross exam Respiratory system: Bilateral equal air entry, normal vesicular breath sounds, no wheezes or crackles  Cardiovascular system: S1 & S2 heard, RRR. No JVD, murmurs, rubs, gallops or clicks. No pedal edema. Gastrointestinal system: Abdomen is nondistended, soft and nontender. No organomegaly or masses felt. Normal bowel sounds heard.G tube clamped Central nervous system: Alert and oriented. No focal neurological deficits. Extremities: No edema, no clubbing ,no cyanosis, distal peripheral pulses palpable. Skin: No rashes, lesions or ulcers,no icterus ,no pallor   Data Reviewed: I have personally reviewed following labs and imaging studies  CBC: Recent Labs  Lab 09/25/18 1924 09/26/18 0614 09/27/18 0605 09/28/18 0042  WBC 3.0* 5.6 3.1* 2.9*  NEUTROABS 2.1 4.0  --   --   HGB 14.1 12.4 10.7* 10.4*  HCT 43.8 39.7 34.5* 34.2*  MCV 84.9 85.0 86.0 89.1  PLT  238 230 194 165   Basic Metabolic Panel: Recent Labs  Lab 09/25/18 1924 09/26/18 0614 09/27/18 0605 09/28/18 0042  NA 138 141 141 140  K 3.7 3.8 3.1* 3.4*  CL 103 103 106 110  CO2 26 27 28 24   GLUCOSE 112* 103* 95 131*  BUN 13 14 16 9   CREATININE 0.57 0.67 0.80 0.64  CALCIUM 9.5 9.3 8.6* 8.2*  MG  --   --  1.4* 1.9   GFR: Estimated Creatinine  Clearance: 42.5 mL/min (by C-G formula based on SCr of 0.64 mg/dL). Liver Function Tests: Recent Labs  Lab 09/25/18 1924  AST 27  ALT 25  ALKPHOS 65  BILITOT 0.6  PROT 6.6  ALBUMIN 3.6   Recent Labs  Lab 09/25/18 1924  LIPASE 39   No results for input(s): AMMONIA in the last 168 hours. Coagulation Profile: No results for input(s): INR, PROTIME in the last 168 hours. Cardiac Enzymes: Recent Labs  Lab 09/25/18 1924  TROPONINI <0.03   BNP (last 3 results) No results for input(s): PROBNP in the last 8760 hours. HbA1C: No results for input(s): HGBA1C in the last 72 hours. CBG: Recent Labs  Lab 09/27/18 1638 09/27/18 2004 09/27/18 2356 09/28/18 0414 09/28/18 0749  GLUCAP 109* 104* 107* 105* 102*   Lipid Profile: No results for input(s): CHOL, HDL, LDLCALC, TRIG, CHOLHDL, LDLDIRECT in the last 72 hours. Thyroid Function Tests: No results for input(s): TSH, T4TOTAL, FREET4, T3FREE, THYROIDAB in the last 72 hours. Anemia Panel: No results for input(s): VITAMINB12, FOLATE, FERRITIN, TIBC, IRON, RETICCTPCT in the last 72 hours. Sepsis Labs: No results for input(s): PROCALCITON, LATICACIDVEN in the last 168 hours.  No results found for this or any previous visit (from the past 240 hour(s)).       Radiology Studies: Dg Abd Portable 1v-small Bowel Obstruction Protocol-initial, 8 Hr Delay  Result Date: 09/27/2018 CLINICAL DATA:  8 hour film post Gastrografin administration. EXAM: PORTABLE ABDOMEN - 1 VIEW COMPARISON:  CT abdomen dated 09/25/2018. FINDINGS: There is contrast within the colon excluding complete small bowel obstruction. There is no obvious small bowel dilatation on today's plain film. No evidence of free intraperitoneal air. Gastrostomy tube overlies the midline. IMPRESSION: Contrast is seen within the colon, thereby excluding a complete small-bowel obstruction. Electronically Signed   By: Bary Richard M.D.   On: 09/27/2018 20:57        Scheduled  Meds: . Influenza vac split quadrivalent PF  0.5 mL Intramuscular Tomorrow-1000  . insulin aspart  0-9 Units Subcutaneous Q4H   Continuous Infusions: . dextrose 5 % and 0.9% NaCl 75 mL/hr at 09/28/18 0210  . heparin 700 Units/hr (09/28/18 0800)  . potassium chloride 10 mEq (09/28/18 1030)     LOS: 3 days    Time spent: 25 mins.More than 50% of that time was spent in counseling and/or coordination of care.      Burnadette Pop, MD [facility] Pager [phone]  If 7PM-7AM, please contact night-coverage [email or web address] Password [email or web address] 09/28/2018, 11:47 AM

## 2018-09-28 NOTE — Progress Notes (Signed)
ANTICOAGULATION CONSULT NOTE - Follow Up Consult  Pharmacy Consult for heparin Indication: hx DVT (home Eliquis on hold)  No Known Allergies  Patient Measurements: Height: 5\' 3"  (160 cm) Weight: 126 lb 1.7 oz (57.2 kg) IBW/kg (Calculated) : 52.4 Heparin Dosing Weight: 43 kg  Vital Signs: Temp: 97.8 F (36.6 C) (10/12 0522) Temp Source: Oral (10/12 0522) BP: 100/57 (10/12 0522) Pulse Rate: 51 (10/12 0522)  Labs: Recent Labs    09/25/18 1924 09/26/18 0614 09/26/18 1942  09/27/18 0605 09/27/18 1540 09/28/18 0042 09/28/18 0634  HGB 14.1 12.4  --   --  10.7*  --  10.4*  --   HCT 43.8 39.7  --   --  34.5*  --  34.2*  --   PLT 238 230  --   --  194  --  165  --   APTT  --   --  30  --  107*  --   --   --   HEPARINUNFRC  --   --   --    < > 0.73* 0.75* 0.51 0.50  CREATININE 0.57 0.67  --   --  0.80  --  0.64  --   TROPONINI <0.03  --   --   --   --   --   --   --    < > = values in this interval not displayed.    Estimated Creatinine Clearance: 42.5 mL/min (by C-G formula based on SCr of 0.64 mg/dL).   Medications:  - on Eliquis 2.5 mg bid PTA (last dose taken on 10/9 at 0800)  Assessment: Patient is an 84 y.o F with hx DVT on Eliquis PTA,  neuroendocrine tumor (s/p resection in Jan 2019) and gastrostomy tube, presented to th ED on 09/25/18 with c/o n/v after tube feeding.  Abd CT on 10/9 showed SOB.  Home Eliquis placed on hold and patient was transitioned to heparin drip while she is NPO.   10/11: Heparin level and aPTT correlated - monitor using only Heparin levels. Heparin rate reduced to 700 units/hr in pm for level 0.75 units/ml, following confirmatory level in therapeutic range  Today, 09/28/2018: - night RN had stopped Heparin ~ 5:15am (stating she saw no current order) - recounted to am RN during hand-off.  - Heparin level due at 0800 drawn at 0630 (early) with result 0.5 units/ml (Heparin off ~ 75 minutes at that point).  Level in range, after discussion with am  RN - Heparin resumed at 700 units/hr.  Goal of Therapy:  Heparin level 0.3-0.7 units/ml aPTT 66-102 seconds Monitor platelets by anticoagulation protocol: Yes   Plan:   Heparin at 700 units/hr  Check Heparin level at 1600 today  Daily CBC, Heparin level   Minda Ditto PharmD Pager [phone] 09/28/2018, 8:08 AM

## 2018-09-28 NOTE — Progress Notes (Signed)
Patient ID: Theresa Howe, female   DOB: 1931/11/15, 84 y.o.   MRN: [id number]       Subjective: Pt with no complaints.  Denies abdominal pain.  No nausea.  g-tube to gravity with only 50cc out last 24h.  Denies BM but there are 2 recorded.  SBO protocol with contrast in colon  Objective: Vital signs in last 24 hours: Temp:  [97.8 F (36.6 C)-98.5 F (36.9 C)] 97.8 F (36.6 C) (10/12 0522) Pulse Rate:  [51-63] 51 (10/12 0522) Resp:  [15-18] 15 (10/12 0522) BP: (100-107)/(57-58) 100/57 (10/12 0522) SpO2:  [96 %-100 %] 100 % (10/12 0522) Weight:  [57.2 kg] 57.2 kg (10/12 0500) Last BM Date: 09/25/18  Intake/Output from previous day: 10/11 0701 - 10/12 0700 In: 1082.3 [I.V.:528.8; IV Piggyback:463.5] Out: 50 [Drains:50] Intake/Output this shift: No intake/output data recorded.  PE: Heart: regular Lungs: CTAB Abd: soft, NT, ND, g-tube to gravity with scant clear output  Lab Results:  Recent Labs    09/27/18 0605 09/28/18 0042  WBC 3.1* 2.9*  HGB 10.7* 10.4*  HCT 34.5* 34.2*  PLT 194 165   BMET Recent Labs    09/27/18 0605 09/28/18 0042  NA 141 140  K 3.1* 3.4*  CL 106 110  CO2 28 24  GLUCOSE 95 131*  BUN 16 9  CREATININE 0.80 0.64  CALCIUM 8.6* 8.2*   PT/INR No results for input(s): LABPROT, INR in the last 72 hours. CMP     Component Value Date/Time   NA 140 09/28/2018 0042   K 3.4 (L) 09/28/2018 0042   CL 110 09/28/2018 0042   CO2 24 09/28/2018 0042   GLUCOSE 131 (H) 09/28/2018 0042   BUN 9 09/28/2018 0042   CREATININE 0.64 09/28/2018 0042   CREATININE 0.88 09/03/2018 1519   CALCIUM 8.2 (L) 09/28/2018 0042   PROT 6.6 09/25/2018 1924   ALBUMIN 3.6 09/25/2018 1924   AST 27 09/25/2018 1924   ALT 25 09/25/2018 1924   ALKPHOS 65 09/25/2018 1924   BILITOT 0.6 09/25/2018 1924   GFRNONAA >60 09/28/2018 0042   GFRNONAA 60 09/03/2018 1519   GFRAA >60 09/28/2018 0042   GFRAA 69 09/03/2018 1519   Lipase     Component Value Date/Time   LIPASE 39  09/25/2018 1924       Studies/Results: Dg Abd Portable 1v-small Bowel Obstruction Protocol-initial, 8 Hr Delay  Result Date: 09/27/2018 CLINICAL DATA:  8 hour film post Gastrografin administration. EXAM: PORTABLE ABDOMEN - 1 VIEW COMPARISON:  CT abdomen dated 09/25/2018. FINDINGS: There is contrast within the colon excluding complete small bowel obstruction. There is no obvious small bowel dilatation on today's plain film. No evidence of free intraperitoneal air. Gastrostomy tube overlies the midline. IMPRESSION: Contrast is seen within the colon, thereby excluding a complete small-bowel obstruction. Electronically Signed   By: Bary Richard M.D.   On: 09/27/2018 20:57    Anti-infectives: Anti-infectives (From admission, onward)   None       Assessment/Plan SBO, history of neuroendocrine tumor with carcinomatosis  -s/p 2 ex laps for bowel resections by Dr. Donell Beers for the above, had dense adhesions at last surgery. -SBo protocol with contrast in colon after 8 hours -mobilize patient TID -IS for pulm toileting. -Will clamp g tube and start sips of clears. If patient were to not open up and looking to possibly need an operation, she would need a palliative care consult prior to this to determine her goals of care given her current medical status  and difficulty of tolerating her previous operations.    H/o DVT HTN HLD FTT DM PCM  FEN - sips, clamp g tube VTE - SCDs/heparin gtt (prior DVT) ID - none   LOS: 3 days    Sharnese Heath A [facility] 09/28/2018, 8:37 AM

## 2018-09-29 LAB — BASIC METABOLIC PANEL
Anion gap: 9 (ref 5–15)
BUN: 6 mg/dL — AB (ref 8–23)
CHLORIDE: 110 mmol/L (ref 98–111)
CO2: 20 mmol/L — AB (ref 22–32)
CREATININE: 0.7 mg/dL (ref 0.44–1.00)
Calcium: 8.6 mg/dL — ABNORMAL LOW (ref 8.9–10.3)
GFR calc Af Amer: >60 mL/min (ref >60)
GFR calc non Af Amer: >60 mL/min (ref >60)
GLUCOSE: 100 mg/dL — AB (ref 70–99)
POTASSIUM: 3.5 mmol/L (ref 3.5–5.1)
Sodium: 139 mmol/L (ref 135–145)

## 2018-09-29 LAB — HEPARIN LEVEL (UNFRACTIONATED): Heparin Unfractionated: 0.53 IU/mL (ref 0.30–0.70)

## 2018-09-29 LAB — GLUCOSE, CAPILLARY
GLUCOSE-CAPILLARY: 165 mg/dL — AB (ref 70–99)
GLUCOSE-CAPILLARY: 139 mg/dL — AB (ref 70–99)
Glucose-Capillary: 110 mg/dL — ABNORMAL HIGH (ref 70–99)
Glucose-Capillary: 114 mg/dL — ABNORMAL HIGH (ref 70–99)
Glucose-Capillary: 60 mg/dL — ABNORMAL LOW (ref 70–99)
Glucose-Capillary: 76 mg/dL (ref 70–99)
Glucose-Capillary: 83 mg/dL (ref 70–99)

## 2018-09-29 LAB — CBC
HCT: 35.8 % — ABNORMAL LOW (ref 36.0–46.0)
Hemoglobin: 11.1 g/dL — ABNORMAL LOW (ref 12.0–15.0)
MCH: 27.2 pg (ref 26.0–34.0)
MCHC: 31 g/dL (ref 30.0–36.0)
MCV: 87.7 fL (ref 80.0–100.0)
NRBC: 0 % (ref 0.0–0.2)
PLATELETS: 170 10*3/uL (ref 150–400)
RBC: 4.08 MIL/uL (ref 3.87–5.11)
RDW: 16 % — AB (ref 11.5–15.5)
WBC: 2.6 10*3/uL — AB (ref 4.0–10.5)

## 2018-09-29 LAB — MAGNESIUM: MAGNESIUM: 1.5 mg/dL — AB (ref 1.7–2.4)

## 2018-09-29 MED ORDER — MAGNESIUM SULFATE 2 GM/50ML IV SOLN
2.0000 g | Freq: Once | INTRAVENOUS | Status: AC
  Administered 2018-09-29: 2 g via INTRAVENOUS
  Filled 2018-09-29 (×2): qty 50

## 2018-09-29 NOTE — Progress Notes (Signed)
  ANTICOAGULATION CONSULT NOTE - Follow Up Consult  Pharmacy Consult for Heparin Indication: hx DVT (home Eliquis on hold)  No Known Allergies  Patient Measurements: Height: 5\' 3"  (160 cm) Weight: 126 lb 1.7 oz (57.2 kg) IBW/kg (Calculated) : 52.4 Heparin Dosing Weight:   Vital Signs: Temp: 98.1 F (36.7 C) (10/13 0600) Temp Source: Oral (10/13 0600) BP: 105/62 (10/13 0600) Pulse Rate: 61 (10/13 0600)  Labs: Recent Labs    09/26/18 1942  09/27/18 0605  09/28/18 0042 09/28/18 0634 09/28/18 1646 09/29/18 0544  HGB  --    < > 10.7*  --  10.4*  --   --  11.1*  HCT  --   --  34.5*  --  34.2*  --   --  35.8*  PLT  --   --  194  --  165  --   --  170  APTT 30  --  107*  --   --   --   --   --   HEPARINUNFRC  --   --  0.73*   < > 0.51 0.50 0.45 0.53  CREATININE  --   --  0.80  --  0.64  --   --   --    < > = values in this interval not displayed.    Estimated Creatinine Clearance: 42.5 mL/min (by C-G formula based on SCr of 0.64 mg/dL).   Medications:  Infusions:  . dextrose 5 % and 0.9% NaCl 75 mL/hr at 09/29/18 0224  . heparin 700 Units/hr (09/28/18 0800)    Assessment: Patient with heparin level at goal.  No heparin issues noted.  Goal of Therapy:  Heparin level 0.3-0.7 units/ml Monitor platelets by anticoagulation protocol: Yes   Plan:  Continue heparin drip at current rate Recheck level at daily with AM labs  Tyler Deis, Shea Stakes Crowford 09/29/2018,6:56 AM

## 2018-09-29 NOTE — Progress Notes (Signed)
Patient ID: Theresa Howe, female   DOB: [birth year], 84 y.o.   MRN: [id number]       Subjective: Did well yesterday with sips. No nausea or emesis. G tube capped. Continues to have bowel movements.   Objective: Vital signs in last 24 hours: Temp:  [98.1 F (36.7 C)-98.6 F (37 C)] 98.1 F (36.7 C) (10/13 0600) Pulse Rate:  [60-69] 61 (10/13 0600) Resp:  [18] 18 (10/13 0600) BP: (105-115)/(62-75) 105/62 (10/13 0600) SpO2:  [98 %] 98 % (10/13 0600) Last BM Date: 09/27/18  Intake/Output from previous day: 10/12 0701 - 10/13 0700 In: 948 [I.V.:648; IV Piggyback:300] Out: 1 [Urine:1] Intake/Output this shift: No intake/output data recorded.  PE: Heart: regular Lungs: CTAB Abd: soft, NT, ND, g-tube site c/d/i  Lab Results:  Recent Labs    09/28/18 0042 09/29/18 0544  WBC 2.9* 2.6*  HGB 10.4* 11.1*  HCT 34.2* 35.8*  PLT 165 170   BMET Recent Labs    09/28/18 0042 09/29/18 0544  NA 140 139  K 3.4* 3.5  CL 110 110  CO2 24 20*  GLUCOSE 131* 100*  BUN 9 6*  CREATININE 0.64 0.70  CALCIUM 8.2* 8.6*   PT/INR No results for input(s): LABPROT, INR in the last 72 hours. CMP     Component Value Date/Time   NA 139 09/29/2018 0544   K 3.5 09/29/2018 0544   CL 110 09/29/2018 0544   CO2 20 (L) 09/29/2018 0544   GLUCOSE 100 (H) 09/29/2018 0544   BUN 6 (L) 09/29/2018 0544   CREATININE 0.70 09/29/2018 0544   CREATININE 0.88 09/03/2018 1519   CALCIUM 8.6 (L) 09/29/2018 0544   PROT 6.6 09/25/2018 1924   ALBUMIN 3.6 09/25/2018 1924   AST 27 09/25/2018 1924   ALT 25 09/25/2018 1924   ALKPHOS 65 09/25/2018 1924   BILITOT 0.6 09/25/2018 1924   GFRNONAA >60 09/29/2018 0544   GFRNONAA 60 09/03/2018 1519   GFRAA >60 09/29/2018 0544   GFRAA 69 09/03/2018 1519   Lipase     Component Value Date/Time   LIPASE 39 09/25/2018 1924       Studies/Results: Dg Abd Portable 1v-small Bowel Obstruction Protocol-initial, 8 Hr Delay  Result Date: 09/27/2018 CLINICAL DATA:   8 hour film post Gastrografin administration. EXAM: PORTABLE ABDOMEN - 1 VIEW COMPARISON:  CT abdomen dated 09/25/2018. FINDINGS: There is contrast within the colon excluding complete small bowel obstruction. There is no obvious small bowel dilatation on today's plain film. No evidence of free intraperitoneal air. Gastrostomy tube overlies the midline. IMPRESSION: Contrast is seen within the colon, thereby excluding a complete small-bowel obstruction. Electronically Signed   By: Bary Richard M.D.   On: 09/27/2018 20:57    Anti-infectives: Anti-infectives (From admission, onward)   None       Assessment/Plan SBO, history of neuroendocrine tumor with carcinomatosis  -s/p 2 ex laps for bowel resections by Dr. Donell Beers for the above, had dense adhesions at last surgery. -SBo protocol with contrast in colon after 8 hours -mobilize patient TID -IS for pulm toileting. -Advance to clear liquid diet. If patient were to not open up and looking to possibly need an operation, she would need a palliative care consult prior to this to determine her goals of care given her current medical status and difficulty of tolerating her previous operations.    H/o DVT HTN HLD FTT DM PCM  FEN - CLD, clamp g tube VTE - SCDs/heparin gtt (prior DVT) ID - none  LOS: 4 days    Lady Deutscher [facility] 09/29/2018, 8:13 AM

## 2018-09-29 NOTE — Progress Notes (Signed)
PROGRESS NOTE    Theresa Howe  MWN:[id number] DOB: 1930-12-31 DOA: 09/25/2018 PCP: [name], MD   Brief Narrative: Patient is 84 year old female with past medical history of diabetes mellitus, hypertension, neuroendocrine tumor status post resection with low-grade carcinomatosis, referral to thrive with gastrostomy tube who presented with complaints of intolerance of her tube feeds.  CT imaging showed small bowel obstruction. General surgery following.  Assessment & Plan:   Principal Problem:   SBO (small bowel obstruction) (HCC) Active Problems:   Diabetes with neurologic complications (HCC)   Adult failure to thrive   Carcinomatosis (HCC)   History of DVT (deep vein thrombosis)   SBO: She had a bowel movement since last evening.  Denies any abdominal pain, nausea or vomiting.  Started on clear liquids.  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  She will  follow-up with oncology as an outpatient as per her PCP.  Failure to thrive: Status post gastrostomy tube with tube feeding.  History of DVT: On Eliquis at home. Eliquis has  Been on hold for SBO and potential need of surgery and she is on heparin drip.  Hypokalemia/hypomagnesemia: Supplemented.   DVT prophylaxis: Heparin IV Code Status: Full Family Communication: None present at the bedside Disposition Plan:Home after clearance from surgery and resolution of small bowel obstruction   Consultants: General surgery  Procedures: None  Antimicrobials: None  Subjective: Patient seen and examined the bedside this morning.  Denies any nausea, vomiting or abdominal pain.  She haas been having bowel movement since yesterday Objective: Vitals:   09/28/18 0522 09/28/18 1551 09/28/18 2200 09/29/18 0600  BP: (!) 100/57 115/75 107/68 105/62  Pulse: (!) 51 69 60 61  Resp: 15 18 18 18   Temp: 97.8 F (36.6 C) 98.6 F (37 C) 98.3 F (36.8 C) 98.1 F (36.7 C)    TempSrc: Oral Oral Oral Oral  SpO2: 100% 98% 98% 98%  Weight:      Height:        Intake/Output Summary (Last 24 hours) at 09/29/2018 1329 Last data filed at 09/29/2018 1002 Gross per 24 hour  Intake 1073 ml  Output 1 ml  Net 1072 ml   Filed Weights   09/25/18 1810 09/26/18 0437 09/28/18 0500  Weight: 43.3 kg 57.7 kg 57.2 kg    Examination:  General exam: Appears calm and comfortable ,thin and debilitated elderly female HEENT:PERRL,Oral mucosa moist, Ear/Nose normal on gross exam Respiratory system: Bilateral equal air entry, normal vesicular breath sounds, no wheezes or crackles  Cardiovascular system: S1 & S2 heard, RRR. No JVD, murmurs, rubs, gallops or clicks. No pedal edema. Gastrointestinal system: Abdomen is nondistended, soft and nontender. No organomegaly or masses felt. Normal bowel sounds heard.G tube  Central nervous system: Alert and oriented. No focal neurological deficits. Extremities: No edema, no clubbing ,no cyanosis, distal peripheral pulses palpable. Skin: No rashes, lesions or ulcers,no icterus ,no pallor   Data Reviewed: I have personally reviewed following labs and imaging studies  CBC: Recent Labs  Lab 09/25/18 1924 09/26/18 0614 09/27/18 0605 09/28/18 0042 09/29/18 0544  WBC 3.0* 5.6 3.1* 2.9* 2.6*  NEUTROABS 2.1 4.0  --   --   --   HGB 14.1 12.4 10.7* 10.4* 11.1*  HCT 43.8 39.7 34.5* 34.2* 35.8*  MCV 84.9 85.0 86.0 89.1 87.7  PLT 238 230 194 165 170   Basic Metabolic Panel: Recent Labs  Lab 09/25/18 1924 09/26/18 4034 09/27/18 7425 09/28/18 0042 09/29/18 0544  NA 138 141 141 140 139  K 3.7 3.8 3.1* 3.4* 3.5  CL 103 103 106 110 110  CO2 26 27 28 24  20*  GLUCOSE 112* 103* 95 131* 100*  BUN 13 14 16 9  6*  CREATININE 0.57 0.67 0.80 0.64 0.70  CALCIUM 9.5 9.3 8.6* 8.2* 8.6*  MG  --   --  1.4* 1.9 1.5*   GFR: Estimated Creatinine Clearance: 42.5 mL/min (by C-G formula based on SCr of 0.7 mg/dL). Liver Function Tests: Recent  Labs  Lab 09/25/18 1924  AST 27  ALT 25  ALKPHOS 65  BILITOT 0.6  PROT 6.6  ALBUMIN 3.6   Recent Labs  Lab 09/25/18 1924  LIPASE 39   No results for input(s): AMMONIA in the last 168 hours. Coagulation Profile: No results for input(s): INR, PROTIME in the last 168 hours. Cardiac Enzymes: Recent Labs  Lab 09/25/18 1924  TROPONINI <0.03   BNP (last 3 results) No results for input(s): PROBNP in the last 8760 hours. HbA1C: No results for input(s): HGBA1C in the last 72 hours. CBG: Recent Labs  Lab 09/28/18 1943 09/29/18 0035 09/29/18 0346 09/29/18 0733 09/29/18 1214  GLUCAP 111* 76 83 110* 165*   Lipid Profile: No results for input(s): CHOL, HDL, LDLCALC, TRIG, CHOLHDL, LDLDIRECT in the last 72 hours. Thyroid Function Tests: No results for input(s): TSH, T4TOTAL, FREET4, T3FREE, THYROIDAB in the last 72 hours. Anemia Panel: No results for input(s): VITAMINB12, FOLATE, FERRITIN, TIBC, IRON, RETICCTPCT in the last 72 hours. Sepsis Labs: No results for input(s): PROCALCITON, LATICACIDVEN in the last 168 hours.  No results found for this or any previous visit (from the past 240 hour(s)).       Radiology Studies: Dg Abd Portable 1v-small Bowel Obstruction Protocol-initial, 8 Hr Delay  Result Date: 09/27/2018 CLINICAL DATA:  8 hour film post Gastrografin administration. EXAM: PORTABLE ABDOMEN - 1 VIEW COMPARISON:  CT abdomen dated 09/25/2018. FINDINGS: There is contrast within the colon excluding complete small bowel obstruction. There is no obvious small bowel dilatation on today's plain film. No evidence of free intraperitoneal air. Gastrostomy tube overlies the midline. IMPRESSION: Contrast is seen within the colon, thereby excluding a complete small-bowel obstruction. Electronically Signed   By: Bary Richard M.D.   On: 09/27/2018 20:57        Scheduled Meds: . Influenza vac split quadrivalent PF  0.5 mL Intramuscular Tomorrow-1000  . insulin aspart  0-9  Units Subcutaneous Q4H   Continuous Infusions: . heparin 700 Units/hr (09/28/18 0800)     LOS: 4 days    Time spent: 25 mins.More than 50% of that time was spent in counseling and/or coordination of care.      Burnadette Pop, MD [facility] Pager [phone]  If 7PM-7AM, please contact night-coverage [email or web address] Password [email or web address] 09/29/2018, 1:29 PM

## 2018-09-29 NOTE — Plan of Care (Signed)

## 2018-09-30 LAB — CBC
HEMATOCRIT: 34.7 % — AB (ref 36.0–46.0)
Hemoglobin: 10.8 g/dL — ABNORMAL LOW (ref 12.0–15.0)
MCH: 27.3 pg (ref 26.0–34.0)
MCHC: 31.1 g/dL (ref 30.0–36.0)
MCV: 87.6 fL (ref 80.0–100.0)
NRBC: 0 % (ref 0.0–0.2)
PLATELETS: 172 10*3/uL (ref 150–400)
RBC: 3.96 MIL/uL (ref 3.87–5.11)
RDW: 16 % — AB (ref 11.5–15.5)
WBC: 2.7 10*3/uL — AB (ref 4.0–10.5)

## 2018-09-30 LAB — GLUCOSE, CAPILLARY
GLUCOSE-CAPILLARY: 137 mg/dL — AB (ref 70–99)
GLUCOSE-CAPILLARY: 87 mg/dL (ref 70–99)
GLUCOSE-CAPILLARY: 99 mg/dL (ref 70–99)
Glucose-Capillary: 76 mg/dL (ref 70–99)
Glucose-Capillary: 82 mg/dL (ref 70–99)
Glucose-Capillary: 135 mg/dL — ABNORMAL HIGH (ref 70–99)
Glucose-Capillary: 116 mg/dL — ABNORMAL HIGH (ref 70–99)

## 2018-09-30 LAB — MAGNESIUM: Magnesium: 1.6 mg/dL — ABNORMAL LOW (ref 1.7–2.4)

## 2018-09-30 LAB — BASIC METABOLIC PANEL
ANION GAP: 9 (ref 5–15)
BUN: <5 mg/dL — ABNORMAL LOW (ref 8–23)
CALCIUM: 8.6 mg/dL — AB (ref 8.9–10.3)
CO2: 22 mmol/L (ref 22–32)
CREATININE: 0.6 mg/dL (ref 0.44–1.00)
Chloride: 107 mmol/L (ref 98–111)
GFR calc non Af Amer: >60 mL/min (ref >60)
Glucose, Bld: 84 mg/dL (ref 70–99)
Potassium: 3.2 mmol/L — ABNORMAL LOW (ref 3.5–5.1)
Sodium: 138 mmol/L (ref 135–145)

## 2018-09-30 LAB — HEPARIN LEVEL (UNFRACTIONATED): Heparin Unfractionated: 0.5 IU/mL (ref 0.30–0.70)

## 2018-09-30 MED ORDER — POTASSIUM CHLORIDE 20 MEQ/15ML (10%) PO SOLN
40.0000 meq | Freq: Once | ORAL | Status: AC
  Administered 2018-09-30: 40 meq via ORAL
  Filled 2018-09-30: qty 30

## 2018-09-30 MED ORDER — INFLUENZA VAC SPLIT HIGH-DOSE 0.5 ML IM SUSY
0.5000 mL | PREFILLED_SYRINGE | Freq: Once | INTRAMUSCULAR | Status: AC
  Administered 2018-09-30: 0.5 mL via INTRAMUSCULAR
  Filled 2018-09-30: qty 0.5

## 2018-09-30 MED ORDER — MAGNESIUM SULFATE 2 GM/50ML IV SOLN
2.0000 g | Freq: Once | INTRAVENOUS | Status: AC
  Administered 2018-09-30: 2 g via INTRAVENOUS
  Filled 2018-09-30: qty 50

## 2018-09-30 MED ORDER — POTASSIUM CHLORIDE 10 MEQ/100ML IV SOLN: 10.0000 meq | INTRAVENOUS | Status: DC

## 2018-09-30 MED ORDER — APIXABAN 2.5 MG PO TABS
2.5000 mg | ORAL_TABLET | Freq: Two times a day (BID) | ORAL | Status: DC
  Administered 2018-09-30 – 2018-10-01 (×3): 2.5 mg via ORAL
  Filled 2018-09-30 (×3): qty 1

## 2018-09-30 MED ORDER — ENSURE ENLIVE PO LIQD
237.0000 mL | Freq: Two times a day (BID) | ORAL | Status: DC
  Administered 2018-09-30 – 2018-10-01 (×4): 237 mL via ORAL

## 2018-09-30 NOTE — Progress Notes (Signed)
  Progress Note: General Surgery Service   Assessment/Plan: Principal Problem:   SBO (small bowel obstruction) (HCC) Active Problems:   Diabetes with neurologic complications (HCC)   Adult failure to thrive   Carcinomatosis ([place])   History of DVT (deep vein thrombosis)  SBO now improved with BMs and tolerating liquids -advance diet -add protein supplement -ok to restart eliquis at this time    LOS: 5 days  Chief Complaint/Subjective: No abdominal pain, +BMs, tolerating liquids  Objective: Vital signs in last 24 hours: Temp:  [97.9 F (36.6 C)-98.6 F (37 C)] 97.9 F (36.6 C) (10/14 0500) Pulse Rate:  [51-55] 51 (10/14 0500) Resp:  [18-21] 18 (10/14 0500) BP: (90-111)/(52-73) 90/52 (10/14 0500) SpO2:  [95 %-99 %] 99 % (10/14 0500) Weight:  [60.9 kg] 60.9 kg (10/14 0500) Last BM Date: 09/28/18  Intake/Output from previous day: 10/13 0701 - 10/14 0700 In: 1007.7 [P.O.:240; I.V.:717.7; IV Piggyback:50] Out: -  Intake/Output this shift: No intake/output data recorded.  Lungs: nonlabored breathing  Abd: soft, NT, ND  Extremities: no edema, thin  Neuro: AOx4  Lab Results: CBC  Recent Labs    09/29/18 0544 09/30/18 0601  WBC 2.6* 2.7*  HGB 11.1* 10.8*  HCT 35.8* 34.7*  PLT 170 172   BMET Recent Labs    09/29/18 0544 09/30/18 0601  NA 139 138  K 3.5 3.2*  CL 110 107  CO2 20* 22  GLUCOSE 100* 84  BUN 6* <5*  CREATININE 0.70 0.60  CALCIUM 8.6* 8.6*   PT/INR No results for input(s): LABPROT, INR in the last 72 hours. ABG No results for input(s): PHART, HCO3 in the last 72 hours.  Invalid input(s): PCO2, PO2  Studies/Results:  Anti-infectives: Anti-infectives (From admission, onward)   None      Medications: Scheduled Meds: . feeding supplement (ENSURE ENLIVE)  237 mL Oral BID BM  . Influenza vac split quadrivalent PF  0.5 mL Intramuscular Once  . insulin aspart  0-9 Units Subcutaneous Q4H   Continuous Infusions: . heparin 700  Units/hr (09/30/18 0400)  . potassium chloride     PRN Meds:.acetaminophen **OR** acetaminophen, morphine injection, ondansetron **OR** ondansetron (ZOFRAN) IV  Mickeal Skinner, MD Pg# [phone] [facility]

## 2018-09-30 NOTE — Progress Notes (Signed)
PROGRESS NOTE    Theresa Howe  UYQ:[id number] DOB: 1931-12-04 DOA: 09/25/2018 PCP: [name], MD   Brief Narrative: Patient is 84 year old female with past medical history of diabetes mellitus, hypertension, neuroendocrine tumor status post resection with low-grade carcinomatosis, referral to thrive with gastrostomy tube who presented with complaints of intolerance of her tube feeds.  CT imaging showed small bowel obstruction. General surgery following.  Assessment & Plan:   Principal Problem:   SBO (small bowel obstruction) (HCC) Active Problems:   Diabetes with neurologic complications (HCC)   Adult failure to thrive   Carcinomatosis (HCC)   History of DVT (deep vein thrombosis)   SBO: She has been having bowel movements  Denies any abdominal pain, nausea or vomiting.  Started on soft diet by surgery.Anticipate to start her tube diet soon.  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  She will  follow-up with oncology as an outpatient as per her PCP.  Failure to thrive: Status post gastrostomy tube with tube feeding.  History of DVT: On Eliquis at home. Eliquis has been restarted.  Hypokalemia/hypomagnesemia: Supplemented.   DVT prophylaxis: Heparin IV Code Status: Full Family Communication: None present at the bedside Disposition Plan:Home after clearance from surgery .Likely tomorrow  Consultants: General surgery  Procedures: None  Antimicrobials: None  Subjective: Patient seen and examined the bedside this morning.  Denies any nausea, vomiting or abdominal pain.  She has been having bowel movements. Soft diet started. Objective: Vitals:   09/29/18 1420 09/29/18 1954 09/30/18 0500 09/30/18 0930  BP: 111/73 105/65 (!) 90/52 110/80  Pulse: (!) 55 (!) 52 (!) 51 60  Resp: 18 (!) 21 18   Temp: 98.2 F (36.8 C) 98.6 F (37 C) 97.9 F (36.6 C)   TempSrc: Oral  Oral   SpO2: 95% 98% 99% 96%  Weight:    60.9 kg   Height:        Intake/Output Summary (Last 24 hours) at 09/30/2018 1303 Last data filed at 09/30/2018 1200 Gross per 24 hour  Intake 745.53 ml  Output -  Net 745.53 ml   Filed Weights   09/26/18 0437 09/28/18 0500 09/30/18 0500  Weight: 57.7 kg 57.2 kg 60.9 kg    Examination:  General exam: Appears calm and comfortable ,thin and debilitated elderly female HEENT:PERRL,Oral mucosa moist, Ear/Nose normal on gross exam Respiratory system: Bilateral equal air entry, normal vesicular breath sounds, no wheezes or crackles  Cardiovascular system: S1 & S2 heard, RRR. No JVD, murmurs, rubs, gallops or clicks. No pedal edema. Gastrointestinal system: Abdomen is nondistended, soft and nontender. No organomegaly or masses felt. Normal bowel sounds heard.G tube  Central nervous system: Alert and oriented. No focal neurological deficits. Extremities: No edema, no clubbing ,no cyanosis, distal peripheral pulses palpable. Skin: No rashes, lesions or ulcers,no icterus ,no pallor   Data Reviewed: I have personally reviewed following labs and imaging studies  CBC: Recent Labs  Lab 09/25/18 1924 09/26/18 0614 09/27/18 0605 09/28/18 0042 09/29/18 0544 09/30/18 0601  WBC 3.0* 5.6 3.1* 2.9* 2.6* 2.7*  NEUTROABS 2.1 4.0  --   --   --   --   HGB 14.1 12.4 10.7* 10.4* 11.1* 10.8*  HCT 43.8 39.7 34.5* 34.2* 35.8* 34.7*  MCV 84.9 85.0 86.0 89.1 87.7 87.6  PLT 238 230 194 165 170 172   Basic Metabolic Panel: Recent Labs  Lab 09/26/18 0614 09/27/18 0605 09/28/18 0042 09/29/18 0544 09/30/18 0601  NA 141 141 140  139 138  K 3.8 3.1* 3.4* 3.5 3.2*  CL 103 106 110 110 107  CO2 27 28 24  20* 22  GLUCOSE 103* 95 131* 100* 84  BUN 14 16 9  6* <5*  CREATININE 0.67 0.80 0.64 0.70 0.60  CALCIUM 9.3 8.6* 8.2* 8.6* 8.6*  MG  --  1.4* 1.9 1.5* 1.6*   GFR: Estimated Creatinine Clearance: 42.5 mL/min (by C-G formula based on SCr of 0.6 mg/dL). Liver Function Tests: Recent Labs  Lab  09/25/18 1924  AST 27  ALT 25  ALKPHOS 65  BILITOT 0.6  PROT 6.6  ALBUMIN 3.6   Recent Labs  Lab 09/25/18 1924  LIPASE 39   No results for input(s): AMMONIA in the last 168 hours. Coagulation Profile: No results for input(s): INR, PROTIME in the last 168 hours. Cardiac Enzymes: Recent Labs  Lab 09/25/18 1924  TROPONINI <0.03   BNP (last 3 results) No results for input(s): PROBNP in the last 8760 hours. HbA1C: No results for input(s): HGBA1C in the last 72 hours. CBG: Recent Labs  Lab 09/29/18 2127 09/30/18 0006 09/30/18 0503 09/30/18 0818 09/30/18 1205  GLUCAP 114* 87 76 82 135*   Lipid Profile: No results for input(s): CHOL, HDL, LDLCALC, TRIG, CHOLHDL, LDLDIRECT in the last 72 hours. Thyroid Function Tests: No results for input(s): TSH, T4TOTAL, FREET4, T3FREE, THYROIDAB in the last 72 hours. Anemia Panel: No results for input(s): VITAMINB12, FOLATE, FERRITIN, TIBC, IRON, RETICCTPCT in the last 72 hours. Sepsis Labs: No results for input(s): PROCALCITON, LATICACIDVEN in the last 168 hours.  No results found for this or any previous visit (from the past 240 hour(s)).       Radiology Studies: No results found.      Scheduled Meds: . apixaban  2.5 mg Oral BID  . feeding supplement (ENSURE ENLIVE)  237 mL Oral BID BM  . insulin aspart  0-9 Units Subcutaneous Q4H   Continuous Infusions:    LOS: 5 days    Time spent: 25 mins.More than 50% of that time was spent in counseling and/or coordination of care.      Burnadette Pop, MD [facility] Pager [phone]  If 7PM-7AM, please contact night-coverage [email or web address] Password [email or web address] 09/30/2018, 1:03 PM

## 2018-09-30 NOTE — Progress Notes (Signed)
ANTICOAGULATION CONSULT NOTE - Follow Up Consult  Pharmacy Consult for Heparin Indication: hxDVT(home Eliquis on hold)  No Known Allergies  Patient Measurements: Height: 5\' 3"  (160 cm) Weight: 134 lb 4.2 oz (60.9 kg) IBW/kg (Calculated) : 52.4 Heparin Dosing Weight:   Vital Signs: Temp: 97.9 F (36.6 C) (10/14 0500) Temp Source: Oral (10/14 0500) BP: 90/52 (10/14 0500) Pulse Rate: 51 (10/14 0500)  Labs: Recent Labs    09/28/18 0042  09/28/18 1646 09/29/18 0544 09/30/18 0601  HGB 10.4*  --   --  11.1* 10.8*  HCT 34.2*  --   --  35.8* 34.7*  PLT 165  --   --  170 172  HEPARINUNFRC 0.51   < > 0.45 0.53 0.50  CREATININE 0.64  --   --  0.70  --    < > = values in this interval not displayed.    Estimated Creatinine Clearance: 42.5 mL/min (by C-G formula based on SCr of 0.7 mg/dL).   Medications:  Infusions:  . heparin 700 Units/hr (09/30/18 0400)    Assessment: Patient with heparin level at goal again.  No heparin issues noted.  Goal of Therapy:  Heparin level 0.3-0.7 units/ml Monitor platelets by anticoagulation protocol: Yes   Plan:  Continue heparin drip at current rate Recheck level with AM labs  Nani Skillern Crowford 09/30/2018,6:32 AM

## 2018-09-30 NOTE — Care Management Important Message (Signed)
Important Message  Patient Details  Name: DEBAR PLATE MRN: [id number] Date of Birth: Dec 29, 1930   Medicare Important Message Given:  Yes    Kerin Salen 09/30/2018, 1:02 [place] Message  Patient Details  Name: TAYIA STONESIFER MRN: [id number] Date of Birth: 08/31/1931   Medicare Important Message Given:  Yes    Kerin Salen 09/30/2018, 1:01 PM

## 2018-09-30 NOTE — Progress Notes (Signed)
Spoke with Kerry Dory RN about the patient who has had 4 PIVs and 3 have infiltrated. The patient has potassium and magnesium ordered as well as continuous heparin. Education given that the patient could benefit from a midline or if the patient was not being discharged then the patient may need a PICC line or central line. Kerry Dory RN stated he would contact the MD and update the order

## 2018-10-01 LAB — BASIC METABOLIC PANEL
ANION GAP: 5 (ref 5–15)
BUN: 9 mg/dL (ref 8–23)
CALCIUM: 9 mg/dL (ref 8.9–10.3)
CO2: 23 mmol/L (ref 22–32)
CREATININE: 0.86 mg/dL (ref 0.44–1.00)
Chloride: 115 mmol/L — ABNORMAL HIGH (ref 98–111)
GFR calc non Af Amer: 60 mL/min — ABNORMAL LOW (ref >60)
GLUCOSE: 84 mg/dL (ref 70–99)
Potassium: 4 mmol/L (ref 3.5–5.1)
Sodium: 143 mmol/L (ref 135–145)

## 2018-10-01 LAB — GLUCOSE, CAPILLARY
GLUCOSE-CAPILLARY: 91 mg/dL (ref 70–99)
GLUCOSE-CAPILLARY: 81 mg/dL (ref 70–99)
GLUCOSE-CAPILLARY: 99 mg/dL (ref 70–99)
Glucose-Capillary: 118 mg/dL — ABNORMAL HIGH (ref 70–99)

## 2018-10-01 LAB — MAGNESIUM: Magnesium: 1.7 mg/dL (ref 1.7–2.4)

## 2018-10-01 MED ORDER — MAGNESIUM OXIDE 400 MG PO TABS: 400.0000 mg | ORAL_TABLET | Freq: Every day | ORAL | 0 refills | Status: DC

## 2018-10-01 NOTE — Final Consult Note (Signed)
Consultant Final Sign-Off Note    Assessment/Final recommendations  Theresa Howe is a 84 y.o. female followed by me for SBO   Wound care (if applicable): none   Diet at discharge: soft diet   Activity at discharge: per primary team   Follow-up appointment:  PCP, no surgical follow up warranted at this time.   Pending results:  Unresulted Labs (From admission, onward)   None       Medication recommendations:   Other recommendations:    Thank you for allowing [place] to participate in the care of your patient!  Please consult [place] again if you have further needs for your patient.  Theresa Howe 10/01/2018 8:51 AM    Subjective  Patient feels well.  No complaints.  Moving her bowels.  Hungry this morning and awaiting breakfast.  She tolerated a soft diet last night for supper.   Objective  Vital signs in last 24 hours: Temp:  [98.2 F (36.8 C)-98.6 F (37 C)] 98.4 F (36.9 C) (10/15 0446) Pulse Rate:  [57-68] 68 (10/15 0446) Resp:  [15-20] 20 (10/15 0446) BP: (89-110)/(50-80) 89/50 (10/15 0446) SpO2:  [96 %-100 %] 98 % (10/15 0446)  General: NAD Abd: soft, NT, ND, +BS, g-tube in place and clamped off   Pertinent labs and Studies: Recent Labs    09/29/18 0544 09/30/18 0601  WBC 2.6* 2.7*  HGB 11.1* 10.8*  HCT 35.8* 34.7*   BMET Recent Labs    09/30/18 0601 10/01/18 0604  NA 138 143  K 3.2* 4.0  CL 107 115*  CO2 22 23  GLUCOSE 84 84  BUN <5* 9  CREATININE 0.60 0.86  CALCIUM 8.6* 9.0   No results for input(s): LABURIN in the last 72 hours. Results for orders placed or performed during the hospital encounter of 01/26/18  Culture, Urine     Status: Abnormal   Collection Time: 02/01/18  2:15 PM  Result Value Ref Range Status   Specimen Description URINE, RANDOM  Final   Special Requests NONE  Final   Culture (A)  Final    <10,000 COLONIES/mL INSIGNIFICANT GROWTH Performed at [facility], [address], [place], [place] [address]    Report Status 02/04/2018 FINAL  Final    Imaging: No results found.

## 2018-10-01 NOTE — Discharge Summary (Signed)
Physician Discharge Summary  Theresa Howe:[id number] DOB: July 01, 1931 DOA: 09/25/2018  PCP: [name], MD  Admit date: 09/25/2018 Discharge date: 10/01/2018  Admitted From: Home Disposition:  Home  Discharge Condition:Stable CODE STATUS:FULL Diet recommendation: Tube feeding,soft diet Brief/Interim Summary:  Patient is 84 year old female with past medical history of diabetes mellitus, hypertension, neuroendocrine tumor status post resection with low-grade carcinomatosis, referral to thrive with gastrostomy tube who presented with complaints of intolerance of her tube feeds.  CT imaging showed small bowel obstruction. General surgery was following. SBO has resolved now and she has been able to tolerate soft diet. She is also on tube feeding diet at home which can be resumed on discharge.  Following problems were addressed during her hospitalization:  SBO: She has been having bowel movements  Denies any abdominal pain, nausea or vomiting.  Started on soft diet by surgery and she tolerated.  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  She will  follow-up with oncology as an outpatient as per her PCP.  Failure to thrive: Status post gastrostomy tube with tube feeding at home.  History of DVT: On Eliquis at home. Eliquis has been restarted.  Hypokalemia/hypomagnesemia: Supplemented.    Discharge Diagnoses:  Principal Problem:   SBO (small bowel obstruction) (HCC) Active Problems:   Diabetes with neurologic complications (HCC)   Adult failure to thrive   Carcinomatosis (HCC)   History of DVT (deep vein thrombosis)    Discharge Instructions  Discharge Instructions    Diet - low sodium heart healthy   Complete by:  As directed    Take your usual tube feeding diet and as tolerated diet bu mouth   Discharge instructions   Complete by:  As directed    1) Please follow up with your PCP in a week. Check BMP  and magnesium level during the follow up.   Increase activity slowly   Complete by:  As directed      Allergies as of 10/01/2018   No Known Allergies     Medication List    TAKE these medications   acetaminophen 325 MG tablet Commonly known as:  TYLENOL Take 650 mg by mouth every 6 (six) hours as needed for mild pain, moderate pain or headache.   apixaban 2.5 MG Tabs tablet Commonly known as:  ELIQUIS Take 2.5 mg by mouth 2 (two) times daily.   Blood Glucose System Pak Kit Please dispense based on patient and insurance preference. Use as directed to monitor FSBS 1x daily. Dx: E11.9.   BLOOD GLUCOSE TEST STRIPS Strp Please dispense based on patient and insurance preference. Use as directed to monitor FSBS 1x daily. Dx: E11.9.   insulin detemir 100 UNIT/ML injection Commonly known as:  LEVEMIR Inject 4 Units into the skin at bedtime.   Insulin Pen Needle 32G X 6 MM Misc Use with insulin pen ICD10:e11.43   Lancets Misc Please dispense based on patient and insurance preference. Use as directed to monitor FSBS 1x daily. Dx: E11.9.   magnesium oxide 400 MG tablet Commonly known as:  MAG-OX Take 1 tablet (400 mg total) by mouth daily.   Metoclopramide HCl 5 MG Tbdp Take 5 mg by mouth 3 (three) times daily.   ondansetron 4 MG tablet Commonly known as:  ZOFRAN Take 4 mg by mouth every 8 (eight) hours as needed for nausea or vomiting.   pantoprazole 40 MG tablet Commonly known as:  PROTONIX Take 40 mg by mouth daily.  Follow-up Information    Dexter, Velna Hatchet, MD. Schedule an appointment as soon as possible for a visit in 1 week(s).   Specialty:  Family Medicine Contact information: [address] [place] [address] [phone]          No Known Allergies  Consultations: Surgery  Procedures/Studies: Ct Abdomen Pelvis W Contrast  Result Date: 09/25/2018 CLINICAL DATA:  Vomiting today after tube feeding. EXAM: CT ABDOMEN AND PELVIS WITH  CONTRAST TECHNIQUE: Multidetector CT imaging of the abdomen and pelvis was performed using the standard protocol following bolus administration of intravenous contrast. CONTRAST:  ISOVUE-300 IOPAMIDOL (ISOVUE-300) INJECTION 61% COMPARISON:  01/26/2018 FINDINGS: Lower chest: Mild dependent changes in the lung bases. Hepatobiliary: No focal liver abnormality is seen. No gallstones, gallbladder wall thickening, or biliary dilatation. Pancreas: Unremarkable. No pancreatic ductal dilatation or surrounding inflammatory changes. Spleen: Normal in size without focal abnormality. Adrenals/Urinary Tract: Adrenal glands are unremarkable. Kidneys are normal, without renal calculi, focal lesion, or hydronephrosis. Bladder is unremarkable. Stomach/Bowel: Gastrostomy tube with balloon in the stomach. Stomach is not abnormally distended and no wall thickening is appreciated. There is prominent fluid distention of proximal small bowel with evidence of fold thickening. There is a transition zone to decompressed small bowel in the right mid abdomen. No obstructing mass is identified. The colon is decompressed with scattered stool present. Changes are consistent with small bowel obstruction. Fold thickening may indicate coexisting or related enteritis. No pneumatosis or portal venous gas to suggest ischemia. Mesenteric vessels appear patent. Vascular/Lymphatic: Aortic atherosclerosis. No enlarged abdominal or pelvic lymph nodes. Reproductive: Status post hysterectomy. No adnexal masses. Vaginal pessary is present. Other: Small amount of free fluid in the abdomen and pelvis, likely ascites. No free air. Abdominal wall musculature appears intact. Musculoskeletal: Prominent degenerative changes in the spine. Mild lumbar scoliosis convex towards the left. No destructive bone lesions. IMPRESSION: 1. Small bowel obstruction with transition zone in the right mid abdomen. No obstructing mass is identified. Small bowel fold thickening  suggesting associated enteritis. 2. Small amount of free fluid in the abdomen and pelvis, likely ascites. No free air. 3. Vaginal pessary. Aortic Atherosclerosis (ICD10-I70.0). Electronically Signed   By: Burman Nieves M.D.   On: 09/25/2018 21:22   Ir Cm Inj Any Colonic Tube W/fluoro  Result Date: 09/12/2018 INDICATION: Poorly functioning gastrostomy tube. EXAM: FLUOROSCOPIC GUIDED REPLACEMENT OF GASTROSTOMY TUBE COMPARISON:  Post gastrostomy tube placement-04/11/2018 MEDICATIONS: None. CONTRAST:  10 cc Isovue-300 - administered into the gastric lumen FLUOROSCOPY TIME:  12 seconds (0.3 mGy) COMPLICATIONS: None immediate. PROCEDURE: Patient was placed supine on the fluoroscopy table. Contrast injection confirms appropriate position functionality of the gastrostomy tube without evidence of leak. The skin surrounding the entrance site of the gastrostomy tube was noted to be mildly erythematous and slightly bleeding was treated with a silver nitrate stick. A dressing was placed. The patient tolerated the procedure well without immediate postprocedural complication. IMPRESSION: Appropriately positioned and functioning pull-through gastrostomy tube. No exchange performed. The patient the patient's niece was educated in regards to dressing changes associated with the gastrostomy tube. Electronically Signed   By: Simonne Come M.D.   On: 09/12/2018 10:58   Dg Abd Portable 1v-small Bowel Obstruction Protocol-initial, 8 Hr Delay  Result Date: 09/27/2018 CLINICAL DATA:  8 hour film post Gastrografin administration. EXAM: PORTABLE ABDOMEN - 1 VIEW COMPARISON:  CT abdomen dated 09/25/2018. FINDINGS: There is contrast within the colon excluding complete small bowel obstruction. There is no obvious small bowel dilatation  on today's plain film. No evidence of free intraperitoneal air. Gastrostomy tube overlies the midline. IMPRESSION: Contrast is seen within the colon, thereby excluding a complete small-bowel obstruction.  Electronically Signed   By: Bary Richard M.D.   On: 09/27/2018 20:57       Subjective:   Discharge Exam: Vitals:   09/30/18 2006 10/01/18 0446  BP: (!) 103/52 (!) 89/50  Pulse: 60 68  Resp: 15 20  Temp: 98.2 F (36.8 C) 98.4 F (36.9 C)  SpO2: 100% 98%   Vitals:   09/30/18 1515 09/30/18 1600 09/30/18 2006 10/01/18 0446  BP: (!) 94/51 100/62 (!) 103/52 (!) 89/50  Pulse: (!) 57 60 60 68  Resp: 18  15 20   Temp: 98.6 F (37 C)  98.2 F (36.8 C) 98.4 F (36.9 C)  TempSrc:   Oral Oral  SpO2: 98%  100% 98%  Weight:      Height:        General: Pt is alert, awake, not in acute distress Cardiovascular: RRR, S1/S2 +, no rubs, no gallops Respiratory: CTA bilaterally, no wheezing, no rhonchi Abdominal: Soft, NT, ND, bowel sounds +,G tube Extremities: no edema, no cyanosis    The results of significant diagnostics from this hospitalization (including imaging, microbiology, ancillary and laboratory) are listed below for reference.     Microbiology: No results found for this or any previous visit (from the past 240 hour(s)).   Labs: BNP (last 3 results) No results for input(s): BNP in the last 8760 hours. Basic Metabolic Panel: Recent Labs  Lab 09/27/18 0605 09/28/18 0042 09/29/18 0544 09/30/18 0601 10/01/18 0604  NA 141 140 139 138 143  K 3.1* 3.4* 3.5 3.2* 4.0  CL 106 110 110 107 115*  CO2 28 24 20* 22 23  GLUCOSE 95 131* 100* 84 84  BUN 16 9 6* <5* 9  CREATININE 0.80 0.64 0.70 0.60 0.86  CALCIUM 8.6* 8.2* 8.6* 8.6* 9.0  MG 1.4* 1.9 1.5* 1.6* 1.7   Liver Function Tests: Recent Labs  Lab 09/25/18 1924  AST 27  ALT 25  ALKPHOS 65  BILITOT 0.6  PROT 6.6  ALBUMIN 3.6   Recent Labs  Lab 09/25/18 1924  LIPASE 39   No results for input(s): AMMONIA in the last 168 hours. CBC: Recent Labs  Lab 09/25/18 1924 09/26/18 0614 09/27/18 0605 09/28/18 0042 09/29/18 0544 09/30/18 0601  WBC 3.0* 5.6 3.1* 2.9* 2.6* 2.7*  NEUTROABS 2.1 4.0  --   --   --    --   HGB 14.1 12.4 10.7* 10.4* 11.1* 10.8*  HCT 43.8 39.7 34.5* 34.2* 35.8* 34.7*  MCV 84.9 85.0 86.0 89.1 87.7 87.6  PLT 238 230 194 165 170 172   Cardiac Enzymes: Recent Labs  Lab 09/25/18 1924  TROPONINI <0.03   BNP: Invalid input(s): POCBNP CBG: Recent Labs  Lab 09/30/18 1725 09/30/18 1921 09/30/18 2315 10/01/18 0355 10/01/18 0802  GLUCAP 137* 99 116* 91 81   D-Dimer No results for input(s): DDIMER in the last 72 hours. Hgb A1c No results for input(s): HGBA1C in the last 72 hours. Lipid Profile No results for input(s): CHOL, HDL, LDLCALC, TRIG, CHOLHDL, LDLDIRECT in the last 72 hours. Thyroid function studies No results for input(s): TSH, T4TOTAL, T3FREE, THYROIDAB in the last 72 hours.  Invalid input(s): FREET3 Anemia work up No results for input(s): VITAMINB12, FOLATE, FERRITIN, TIBC, IRON, RETICCTPCT in the last 72 hours. Urinalysis    Component Value Date/Time   COLORURINE YELLOW 09/26/2018  0428   APPEARANCEUR CLEAR 09/26/2018 0428   LABSPEC >1.046 (H) 09/26/2018 0428   PHURINE 5.0 09/26/2018 0428   GLUCOSEU NEGATIVE 09/26/2018 0428   HGBUR SMALL (A) 09/26/2018 0428   BILIRUBINUR NEGATIVE 09/26/2018 0428   KETONESUR 5 (A) 09/26/2018 0428   PROTEINUR NEGATIVE 09/26/2018 0428   NITRITE NEGATIVE 09/26/2018 0428   LEUKOCYTESUR LARGE (A) 09/26/2018 0428   Sepsis Labs Invalid input(s): PROCALCITONIN,  WBC,  LACTICIDVEN Microbiology No results found for this or any previous visit (from the past 240 hour(s)).  Please note: You were cared for by a hospitalist during your hospital stay. Once you are discharged, your primary care physician will handle any further medical issues. Please note that NO REFILLS for any discharge medications will be authorized once you are discharged, as it is imperative that you return to your primary care physician (or establish a relationship with a primary care physician if you do not have one) for your post hospital discharge  needs so that they can reassess your need for medications and monitor your lab values.    Time coordinating discharge: 40 minutes  SIGNED:   Burnadette Pop, MD  [facility] 10/01/2018, 11:06 AM Pager [phone]  If 7PM-7AM, please contact night-coverage [email or web address] Password [email or web address]

## 2018-10-02 DIAGNOSIS — C7A8 Other malignant neuroendocrine tumors: Secondary | ICD-10-CM | POA: Diagnosis not present

## 2018-10-02 DIAGNOSIS — I82502 Chronic embolism and thrombosis of unspecified deep veins of left lower extremity: Secondary | ICD-10-CM | POA: Diagnosis not present

## 2018-10-02 DIAGNOSIS — Z431 Encounter for attention to gastrostomy: Secondary | ICD-10-CM | POA: Diagnosis not present

## 2018-10-02 DIAGNOSIS — C786 Secondary malignant neoplasm of retroperitoneum and peritoneum: Secondary | ICD-10-CM | POA: Diagnosis not present

## 2018-10-02 DIAGNOSIS — E1165 Type 2 diabetes mellitus with hyperglycemia: Secondary | ICD-10-CM | POA: Diagnosis not present

## 2018-10-02 DIAGNOSIS — E43 Unspecified severe protein-calorie malnutrition: Secondary | ICD-10-CM | POA: Diagnosis not present

## 2018-10-03 ENCOUNTER — Ambulatory Visit (INDEPENDENT_AMBULATORY_CARE_PROVIDER_SITE_OTHER): Payer: Medicare Other | Admitting: Family Medicine

## 2018-10-03 ENCOUNTER — Other Ambulatory Visit: Payer: Self-pay

## 2018-10-03 ENCOUNTER — Telehealth: Payer: Self-pay | Admitting: *Deleted

## 2018-10-03 ENCOUNTER — Encounter: Payer: Self-pay | Admitting: Family Medicine

## 2018-10-03 VITALS — BP 118/64 | HR 62 | Temp 97.9°F | Resp 14 | Ht 63.0 in | Wt 95.0 lb

## 2018-10-03 DIAGNOSIS — K56609 Unspecified intestinal obstruction, unspecified as to partial versus complete obstruction: Secondary | ICD-10-CM

## 2018-10-03 DIAGNOSIS — Z09 Encounter for follow-up examination after completed treatment for conditions other than malignant neoplasm: Secondary | ICD-10-CM | POA: Diagnosis not present

## 2018-10-03 DIAGNOSIS — Z431 Encounter for attention to gastrostomy: Secondary | ICD-10-CM | POA: Diagnosis not present

## 2018-10-03 DIAGNOSIS — E1143 Type 2 diabetes mellitus with diabetic autonomic (poly)neuropathy: Secondary | ICD-10-CM | POA: Diagnosis not present

## 2018-10-03 DIAGNOSIS — I82502 Chronic embolism and thrombosis of unspecified deep veins of left lower extremity: Secondary | ICD-10-CM | POA: Diagnosis not present

## 2018-10-03 DIAGNOSIS — E1165 Type 2 diabetes mellitus with hyperglycemia: Secondary | ICD-10-CM | POA: Diagnosis not present

## 2018-10-03 DIAGNOSIS — C7A8 Other malignant neuroendocrine tumors: Secondary | ICD-10-CM | POA: Diagnosis not present

## 2018-10-03 DIAGNOSIS — E43 Unspecified severe protein-calorie malnutrition: Secondary | ICD-10-CM

## 2018-10-03 DIAGNOSIS — E876 Hypokalemia: Secondary | ICD-10-CM

## 2018-10-03 DIAGNOSIS — C786 Secondary malignant neoplasm of retroperitoneum and peritoneum: Secondary | ICD-10-CM | POA: Diagnosis not present

## 2018-10-03 NOTE — Telephone Encounter (Signed)
ok 

## 2018-10-03 NOTE — Telephone Encounter (Signed)
Received call from Melisa, [facility] SN with [facility] [phone]~ telephone.   Reports that patient was admitted to hospital for SBO, but has returned home as of 10/01/2018.  Requested orders to resume [facility] SN care. VO given.   Also states that patient has been eating and drinking ensure, so she has not resumed tube feeding. Advised that Ensure caused severe diarrhea the last time she was using that so she needs to resume Vital 1.5 for nutrition.   Of note, patient has appointment this afternoon with provider.

## 2018-10-03 NOTE — Patient Instructions (Signed)
Tube feeds continue Vital  Oral can do ensure or boost and other tolerable soft foods  Return next week to recheck labs  Continue to check sugars in the am  Follow up outpatient with oncology

## 2018-10-03 NOTE — Progress Notes (Signed)
Patient ID: Theresa Howe, female    DOB: Apr 30, 1931, 84 y.o.   MRN: [id number]  PCP: [name], MD  Chief Complaint  Patient presents with  . Hospital F/U    small bowel obstruction    Subjective:   Theresa Howe is a 84 y.o. female, presents to clinic with CC of follow-up for weights and diabetes and also she was readmitted to the hospital with small bowel obstruction.  This was split to the family that was scar tissue.  She had gone for imaging a few days after admission and contrasted KUB through G-tube did show that it was patent.  His daughter-in-law is unsure if this means she can use the G-tube or only use things by mouth she has only been eating and drinking and short by mouth.  She has felt really good since being home from the hospital.  Has been taking 4 units of Levemir and fasting blood sugars have been in the 90-100.    She was admitted on 09/25/18 for nausea, vomiting and small bowel obstruction, discharged on 10/01/2018.  Followed by and evaluated by general surgery, small bowel obstruction resolved and she was tolerating soft oral diet was also instructed per the discharge instructions to continue tube feeding diet when discharged to home.  Charge summary recommended follow-up with PCP in 1 week and recheck a BMP and magnesium at 1 week.  Discharge instructions for diet included low-sodium and soft diet as tolerated by mouth and usual tube feeding diet.   Patient Active Problem List   Diagnosis Date Noted  . History of DVT (deep vein thrombosis) 09/26/2018  . SBO (small bowel obstruction) (HCC) 09/25/2018  . Advance care planning   . On total parenteral nutrition (TPN)   . Dysphagia   . Intra-abdominal abscess (HCC)   . Oral thrush   . Palliative care by specialist   . Goals of care, counseling/discussion   . Carcinomatosis (HCC) 01/17/2018  . Adult failure to thrive 12/24/2017  . Protein-calorie malnutrition, severe 12/06/2017  . Small bowel mass  12/04/2017  . Protein-calorie malnutrition (HCC) 09/11/2017  . Weight loss 09/11/2017  . Diabetic neuropathy (HCC) 09/28/2015  . Hip pain 09/21/2014  . Muscle ache of extremity 06/12/2014  . Anxiety state 10/06/2013  . Bladder prolapse, female, acquired 08/03/2013  . Osteoporosis, unspecified 08/03/2013  . Nonspecific (abnormal) findings on radiological and other examination of biliary tract 04/17/2013  . Diabetes with neurologic complications (HCC) 09/18/2011  . HTN (hypertension) 09/18/2011  . Hyperlipidemia LDL goal <70 09/18/2011  . Glaucoma 09/18/2011     Prior to Admission medications   Medication Sig Start Date End Date Taking? Authorizing Provider  acetaminophen (TYLENOL) 325 MG tablet Take 650 mg by mouth every 6 (six) hours as needed for mild pain, moderate pain or headache.    Yes [provider]  apixaban (ELIQUIS) 2.5 MG TABS tablet Take 2.5 mg by mouth 2 (two) times daily.   Yes [provider]  Blood Glucose Monitoring Suppl (BLOOD GLUCOSE SYSTEM PAK) KIT Please dispense based on patient and insurance preference. Use as directed to monitor FSBS 1x daily. Dx: E11.9. 09/03/18  Yes Danelle Berry, PA-C  Glucose Blood (BLOOD GLUCOSE TEST STRIPS) STRP Please dispense based on patient and insurance preference. Use as directed to monitor FSBS 1x daily. Dx: E11.9. 09/03/18  Yes Danelle Berry, PA-C  insulin detemir (LEVEMIR) 100 UNIT/ML injection Inject 4 Units into the skin at bedtime.    Yes [provider]  Insulin Pen Needle (BD PEN NEEDLE MICRO U/F) 32G X 6 MM MISC Use with insulin pen ICD10:e11.43 09/16/18  Yes Irwin, Velna Hatchet, MD  Lancets MISC Please dispense based on patient and insurance preference. Use as directed to monitor FSBS 1x daily. Dx: E11.9. 09/03/18  Yes Danelle Berry, PA-C  magnesium oxide (MAG-OX) 400 MG tablet Take 1 tablet (400 mg total) by mouth daily. 10/01/18  Yes Burnadette Pop, MD  Metoclopramide HCl 5 MG TBDP Take 5 mg by mouth 3  (three) times daily. 09/13/18  Yes [provider]  ondansetron (ZOFRAN) 4 MG tablet Take 4 mg by mouth every 8 (eight) hours as needed for nausea or vomiting.   Yes [provider]  pantoprazole (PROTONIX) 40 MG tablet Take 40 mg by mouth daily.   Yes [provider]     No Known Allergies   History reviewed. No pertinent family history.   Social History   Socioeconomic History  . Marital status: Widowed    Spouse name: Not on file  . Number of children: Not on file  . Years of education: Not on file  . Highest education level: Not on file  Occupational History  . Not on file  Social Needs  . Financial resource strain: Not on file  . Food insecurity:    Worry: Not on file    Inability: Not on file  . Transportation needs:    Medical: Not on file    Non-medical: Not on file  Tobacco Use  . Smoking status: Never Smoker  . Smokeless tobacco: Never Used  Substance and Sexual Activity  . Alcohol use: No  . Drug use: No  . Sexual activity: Not Currently  Lifestyle  . Physical activity:    Days per week: Not on file    Minutes per session: Not on file  . Stress: Not on file  Relationships  . Social connections:    Talks on phone: Not on file    Gets together: Not on file    Attends religious service: Not on file    Active member of club or organization: Not on file    Attends meetings of clubs or organizations: Not on file    Relationship status: Not on file  . Intimate partner violence:    Fear of current or ex partner: Not on file    Emotionally abused: Not on file    Physically abused: Not on file    Forced sexual activity: Not on file  Other Topics Concern  . Not on file  Social History Narrative  . Not on file     Review of Systems  Constitutional: Negative.   HENT: Negative.   Eyes: Negative.   Respiratory: Negative.   Cardiovascular: Negative.   Gastrointestinal: Negative.   Endocrine: Negative.   Genitourinary:  Negative.   Musculoskeletal: Negative.   Skin: Negative.   Allergic/Immunologic: Negative.   Neurological: Negative.   Hematological: Negative.   Psychiatric/Behavioral: Negative.   All other systems reviewed and are negative.      Objective:    Vitals:   10/03/18 1621  BP: 118/64  Pulse: 62  Resp: 14  Temp: 97.9 F (36.6 C)  TempSrc: Oral  SpO2: 99%  Weight: 95 lb (43.1 kg)  Height: 5\' 3"  (1.6 m)      Physical Exam  Constitutional: She appears well-developed. No distress.  Alert, thin elderly female, nontoxic appearing  HENT:  Head: Normocephalic and atraumatic.  Nose: Nose normal.  Mouth/Throat: Oropharynx is clear and moist.  Eyes: Pupils are equal, round, and reactive to light. Conjunctivae are normal. Right eye exhibits no discharge. Left eye exhibits no discharge. No scleral icterus.  Neck: Normal range of motion. No tracheal deviation present.  Cardiovascular: Normal rate, regular rhythm, normal heart sounds and intact distal pulses. Exam reveals no gallop and no friction rub.  No murmur heard. Pulmonary/Chest: Effort normal. No stridor. No respiratory distress.  Abdominal: Soft. Bowel sounds are normal.   Abd non-tender, non-distended g-tube secured w/o surrounding erythema or drainage  Musculoskeletal: Normal range of motion.  Neurological: She is alert. She displays atrophy. She displays no tremor. She exhibits normal muscle tone. Gait abnormal. Coordination normal.  Skin: Skin is warm and dry. No rash noted. She is not diaphoretic.  Psychiatric: She has a normal mood and affect. Her behavior is normal.  Nursing note and vitals reviewed.         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  Going up on lab abnormalities found in hospital   ICD-10-CM   1. Hypokalemia E87.6 BASIC METABOLIC PANEL WITH GFR    Basic metabolic panel    Magnesium  2. Hypomagnesemia E83.42 Magnesium    Basic metabolic panel    Magnesium  3. Encounter for examination following treatment at hospital Z09   4. Type 2 diabetes mellitus with diabetic autonomic neuropathy, without long-term current use of insulin (HCC) E11.43   5. Protein-calorie malnutrition, severe E43 Basic metabolic panel    Magnesium  6. SBO (small bowel obstruction) (HCC) K56.609     Patient encouraged to continue to eat what is tolerated by mouth and to use tube feeds as previously instructed with vital -the discharge summary.  I believe this will help support her with her malnutrition and weight loss.  Patient still pending oncology outpatient appointment.  There was notation from general surgery about outpatient follow-up.  Labs ordered for pt to do BMP/mag at another lab closer to where they live in [facility].  Hospital discharge summary 101-week follow-up in 1 week repeated labs, they presented 2 days after she was discharged and so will repeat labs next week   Danelle Berry, PA-C 10/03/18 4:24 PM

## 2018-10-05 ENCOUNTER — Other Ambulatory Visit: Payer: Self-pay

## 2018-10-05 ENCOUNTER — Emergency Department (HOSPITAL_BASED_OUTPATIENT_CLINIC_OR_DEPARTMENT_OTHER): Payer: Medicare Other

## 2018-10-05 ENCOUNTER — Encounter (HOSPITAL_BASED_OUTPATIENT_CLINIC_OR_DEPARTMENT_OTHER): Payer: Self-pay | Admitting: Emergency Medicine

## 2018-10-05 ENCOUNTER — Inpatient Hospital Stay (HOSPITAL_BASED_OUTPATIENT_CLINIC_OR_DEPARTMENT_OTHER)
Admission: EM | Admit: 2018-10-05 | Discharge: 2018-10-11 | DRG: 374 | Disposition: A | Discharge status: Home Health | Payer: Medicare Other | Attending: Internal Medicine | Admitting: Internal Medicine

## 2018-10-05 DIAGNOSIS — Z931 Gastrostomy status: Secondary | ICD-10-CM

## 2018-10-05 DIAGNOSIS — C786 Secondary malignant neoplasm of retroperitoneum and peritoneum: Principal | ICD-10-CM | POA: Diagnosis present

## 2018-10-05 DIAGNOSIS — I10 Essential (primary) hypertension: Secondary | ICD-10-CM | POA: Diagnosis not present

## 2018-10-05 DIAGNOSIS — K56609 Unspecified intestinal obstruction, unspecified as to partial versus complete obstruction: Secondary | ICD-10-CM

## 2018-10-05 DIAGNOSIS — Z7901 Long term (current) use of anticoagulants: Secondary | ICD-10-CM | POA: Diagnosis not present

## 2018-10-05 DIAGNOSIS — E1140 Type 2 diabetes mellitus with diabetic neuropathy, unspecified: Secondary | ICD-10-CM | POA: Diagnosis not present

## 2018-10-05 DIAGNOSIS — Z79899 Other long term (current) drug therapy: Secondary | ICD-10-CM | POA: Diagnosis not present

## 2018-10-05 DIAGNOSIS — Z794 Long term (current) use of insulin: Secondary | ICD-10-CM | POA: Diagnosis not present

## 2018-10-05 DIAGNOSIS — E876 Hypokalemia: Secondary | ICD-10-CM | POA: Diagnosis not present

## 2018-10-05 DIAGNOSIS — E43 Unspecified severe protein-calorie malnutrition: Secondary | ICD-10-CM | POA: Diagnosis present

## 2018-10-05 DIAGNOSIS — E785 Hyperlipidemia, unspecified: Secondary | ICD-10-CM | POA: Diagnosis present

## 2018-10-05 DIAGNOSIS — M810 Age-related osteoporosis without current pathological fracture: Secondary | ICD-10-CM | POA: Diagnosis not present

## 2018-10-05 DIAGNOSIS — IMO0001 Reserved for inherently not codable concepts without codable children: Secondary | ICD-10-CM | POA: Diagnosis present

## 2018-10-05 DIAGNOSIS — D3A8 Other benign neuroendocrine tumors: Secondary | ICD-10-CM

## 2018-10-05 DIAGNOSIS — Z90710 Acquired absence of both cervix and uterus: Secondary | ICD-10-CM

## 2018-10-05 DIAGNOSIS — Z681 Body mass index (BMI) 19 or less, adult: Secondary | ICD-10-CM

## 2018-10-05 DIAGNOSIS — R627 Adult failure to thrive: Secondary | ICD-10-CM | POA: Diagnosis not present

## 2018-10-05 DIAGNOSIS — R112 Nausea with vomiting, unspecified: Secondary | ICD-10-CM | POA: Diagnosis not present

## 2018-10-05 DIAGNOSIS — Z86718 Personal history of other venous thrombosis and embolism: Secondary | ICD-10-CM

## 2018-10-05 DIAGNOSIS — E119 Type 2 diabetes mellitus without complications: Secondary | ICD-10-CM

## 2018-10-05 DIAGNOSIS — K56600 Partial intestinal obstruction, unspecified as to cause: Secondary | ICD-10-CM

## 2018-10-05 DIAGNOSIS — Z0189 Encounter for other specified special examinations: Secondary | ICD-10-CM

## 2018-10-05 DIAGNOSIS — H409 Unspecified glaucoma: Secondary | ICD-10-CM | POA: Diagnosis present

## 2018-10-05 DIAGNOSIS — Z9049 Acquired absence of other specified parts of digestive tract: Secondary | ICD-10-CM | POA: Diagnosis not present

## 2018-10-05 DIAGNOSIS — Z85068 Personal history of other malignant neoplasm of small intestine: Secondary | ICD-10-CM

## 2018-10-05 LAB — CBC WITH DIFFERENTIAL/PLATELET
ABS IMMATURE GRANULOCYTES: 0.01 10*3/uL (ref 0.00–0.07)
BASOS PCT: 1 %
Basophils Absolute: 0 10*3/uL (ref 0.0–0.1)
EOS PCT: 2 %
Eosinophils Absolute: 0.1 10*3/uL (ref 0.0–0.5)
HCT: 40.3 % (ref 36.0–46.0)
Hemoglobin: 12.6 g/dL (ref 12.0–15.0)
Immature Granulocytes: 0 %
Lymphocytes Relative: 30 %
Lymphs Abs: 0.9 10*3/uL (ref 0.7–4.0)
MCH: 27.1 pg (ref 26.0–34.0)
MCHC: 31.3 g/dL (ref 30.0–36.0)
MCV: 86.7 fL (ref 80.0–100.0)
MONOS PCT: 11 %
Monocytes Absolute: 0.4 10*3/uL (ref 0.1–1.0)
Neutro Abs: 1.7 10*3/uL (ref 1.7–7.7)
Neutrophils Relative %: 56 %
PLATELETS: 241 10*3/uL (ref 150–400)
RBC: 4.65 MIL/uL (ref 3.87–5.11)
RDW: 16.6 % — ABNORMAL HIGH (ref 11.5–15.5)
WBC: 3 10*3/uL — AB (ref 4.0–10.5)
nRBC: 0 % (ref 0.0–0.2)

## 2018-10-05 LAB — COMPREHENSIVE METABOLIC PANEL
ALK PHOS: 60 U/L (ref 38–126)
ALT: 23 U/L (ref 0–44)
AST: 30 U/L (ref 15–41)
Albumin: 3.3 g/dL — ABNORMAL LOW (ref 3.5–5.0)
Anion gap: 11 (ref 5–15)
BILIRUBIN TOTAL: 1 mg/dL (ref 0.3–1.2)
BUN: 15 mg/dL (ref 8–23)
CALCIUM: 8.7 mg/dL — AB (ref 8.9–10.3)
CO2: 23 mmol/L (ref 22–32)
CREATININE: 0.82 mg/dL (ref 0.44–1.00)
Chloride: 105 mmol/L (ref 98–111)
Glucose, Bld: 144 mg/dL — ABNORMAL HIGH (ref 70–99)
Potassium: 4.1 mmol/L (ref 3.5–5.1)
Sodium: 139 mmol/L (ref 135–145)
TOTAL PROTEIN: 6.6 g/dL (ref 6.5–8.1)

## 2018-10-05 LAB — LIPASE, BLOOD: LIPASE: 43 U/L (ref 11–51)

## 2018-10-05 LAB — URINALYSIS, ROUTINE W REFLEX MICROSCOPIC
BILIRUBIN URINE: NEGATIVE
GLUCOSE, UA: NEGATIVE mg/dL
Ketones, ur: NEGATIVE mg/dL
Nitrite: NEGATIVE
PROTEIN: 30 mg/dL — AB
pH: 7 (ref 5.0–8.0)

## 2018-10-05 LAB — URINALYSIS, MICROSCOPIC (REFLEX): WBC, UA: >50 WBC/hpf (ref 0–5)

## 2018-10-05 LAB — GLUCOSE, CAPILLARY
Glucose-Capillary: 60 mg/dL — ABNORMAL LOW (ref 70–99)
Glucose-Capillary: 81 mg/dL (ref 70–99)
Glucose-Capillary: 80 mg/dL (ref 70–99)

## 2018-10-05 MED ORDER — ONDANSETRON HCL 4 MG/2ML IJ SOLN
4.0000 mg | Freq: Once | INTRAMUSCULAR | Status: AC
  Administered 2018-10-05: 4 mg via INTRAVENOUS
  Filled 2018-10-05: qty 2

## 2018-10-05 MED ORDER — SODIUM CHLORIDE 0.9 % IV SOLN: INTRAVENOUS | Status: DC

## 2018-10-05 MED ORDER — DEXTROSE-NACL 5-0.45 % IV SOLN
INTRAVENOUS | Status: DC
  Administered 2018-10-05: 17:00:00 via INTRAVENOUS

## 2018-10-05 MED ORDER — IOPAMIDOL (ISOVUE-300) INJECTION 61%
30.0000 mL | Freq: Once | INTRAVENOUS | Status: AC | PRN
  Administered 2018-10-05: 15 mL via ORAL

## 2018-10-05 MED ORDER — INSULIN ASPART 100 UNIT/ML ~~LOC~~ SOLN
0.0000 [IU] | Freq: Three times a day (TID) | SUBCUTANEOUS | Status: DC
  Administered 2018-10-07 – 2018-10-08 (×2): 2 [IU] via SUBCUTANEOUS
  Administered 2018-10-09: 1 [IU] via SUBCUTANEOUS
  Administered 2018-10-10: 2 [IU] via SUBCUTANEOUS
  Administered 2018-10-10 – 2018-10-11 (×2): 1 [IU] via SUBCUTANEOUS

## 2018-10-05 MED ORDER — SODIUM CHLORIDE 0.9 % IV BOLUS
500.0000 mL | Freq: Once | INTRAVENOUS | Status: AC
  Administered 2018-10-05: 10:00:00 via INTRAVENOUS

## 2018-10-05 MED ORDER — IOPAMIDOL (ISOVUE-300) INJECTION 61%
100.0000 mL | Freq: Once | INTRAVENOUS | Status: AC | PRN
  Administered 2018-10-05: 85 mL via INTRAVENOUS

## 2018-10-05 MED ORDER — SODIUM CHLORIDE 0.9 % IV BOLUS: 1000.0000 mL | Freq: Once | INTRAVENOUS | Status: DC

## 2018-10-05 NOTE — ED Notes (Signed)
ED Provider at bedside. 

## 2018-10-05 NOTE — ED Notes (Signed)
Report to Marcie Bal, Therapist, sports at [employer].

## 2018-10-05 NOTE — ED Notes (Signed)
Patient transported to CT 

## 2018-10-05 NOTE — ED Triage Notes (Signed)
Recently released from [facility] for SBO. Family states vomiting started again 2 days ago.

## 2018-10-05 NOTE — H&P (Signed)
History and Physical    [name]:[id number] DOB: [birth year] DOA: 10/05/2018  I have briefly reviewed the patient's prior medical records in [facility]  PCP: [name], MD  Patient coming from: home  Chief Complaint: nausea/vomiting  HPI: Theresa Howe is a 84 y.o. female with medical history significant of neuroendocrine tumor with carcinomatosis, history of 2 ex laps for bowel resection by Dr. Donell Beers, DM, prior DVT on Eliquis presents to the ER with complaints of nausea/vomiting. She was just discharged 4 days ago with SBO which improved with conservative measures.  She felt okay at home for a day or 2 however she started to have more nausea, abdominal discomfort, and she stopped having any bowel movements in the last 2 days.  She also reports 1-2 episodes of emesis.  She denies any fever or chills.  She reports lower abdominal pain "bandlike" which is chronic and has not changed.  She denies any chest pain, she denies any shortness of breath.  ED Course: in the ER her vitals are stable, labs unremarkable. CT abdomen pelvis shows SBO with transition point LLQ. Surgery was called and we were asked to admit  Review of Systems: As per HPI otherwise 10 point review of systems negative.   Past Medical History:  Diagnosis Date  . Abdominal mass 10/2017   SMALL BOWEL  . Diabetes mellitus    type 2  . Dyspnea   . Failure to thrive (0-17) 12/24/2017  . Glaucoma   . Hyperlipidemia   . Hypertension   . Neuropathy   . Osteoporosis   . Wheezing     Past Surgical History:  Procedure Laterality Date  . ABDOMINAL HYSTERECTOMY    . CATARACT EXTRACTION Right 01/2010  . COLON SURGERY    . COLONOSCOPY    . EUS N/A 04/17/2013   Procedure: UPPER ENDOSCOPIC ULTRASOUND (EUS) LINEAR;  Surgeon: Rachael Fee, MD;  Location: [facility];  Service: Endoscopy;  Laterality: N/A;  . GLAUCOMA SURGERY  2007  . IR CM INJ ANY COLONIC TUBE W/FLUORO  09/12/2018  . IR FLUORO GUIDE  CV LINE RIGHT  01/17/2018  . IR GASTROSTOMY TUBE MOD SED  04/11/2018  . IR IMAGE GUIDED DRAINAGE PERCUT CATH  PERITONEAL RETROPERIT  01/09/2018  . IR REMOVAL TUN CV CATH W/O FL  06/19/2018  . IR SINUS/FIST TUBE CHK-NON GI  01/18/2018  . IR US GUIDE VASC ACCESS RIGHT  01/17/2018  . LAPAROSCOPIC SMALL BOWEL RESECTION  12/04/2017  . LAPAROSCOPIC SMALL BOWEL RESECTION N/A 12/04/2017   Procedure: LAPAROSCOPIC SMALL BOWEL RESECTION;  Surgeon: Almond Lint, MD;  Location: [facility] OR;  Service: General;  Laterality: N/A;  . LAPAROSCOPY N/A 12/04/2017   Procedure: LAPAROSCOPY DIAGNOSTIC;  Surgeon: Almond Lint, MD;  Location: [facility] OR;  Service: General;  Laterality: N/A;  . LAPAROSCOPY N/A 12/27/2017   Procedure: DIAGNOSTIC LAPAROSCOPY/ LAPAROTOMY WITH SMALL BOWEL RESECTION TIMES TWO;  Surgeon: Almond Lint, MD;  Location: [facility] OR;  Service: General;  Laterality: N/A;  . MULTIPLE TOOTH EXTRACTIONS    . PICC LINE INSERTION  12/24/2017     reports that she has never smoked. She has never used smokeless tobacco. She reports that she does not drink alcohol or use drugs.  No Known Allergies  Family history reviewed and noncontributory   Prior to Admission medications   Medication Sig Start Date End Date Taking? Authorizing Provider  acetaminophen (TYLENOL) 325 MG tablet Take 650 mg by mouth every 6 (six) hours as needed  for mild pain, moderate pain or headache.     [provider]  apixaban (ELIQUIS) 2.5 MG TABS tablet Take 2.5 mg by mouth 2 (two) times daily.    [provider]  Blood Glucose Monitoring Suppl (BLOOD GLUCOSE SYSTEM PAK) KIT Please dispense based on patient and insurance preference. Use as directed to monitor FSBS 1x daily. Dx: E11.9. 09/03/18   Danelle Berry, PA-C  Glucose Blood (BLOOD GLUCOSE TEST STRIPS) STRP Please dispense based on patient and insurance preference. Use as directed to monitor FSBS 1x daily. Dx: E11.9. 09/03/18   Danelle Berry, PA-C  insulin detemir (LEVEMIR) 100  UNIT/ML injection Inject 4 Units into the skin at bedtime.     [provider]  Insulin Pen Needle (BD PEN NEEDLE MICRO U/F) 32G X 6 MM MISC Use with insulin pen ICD10:e11.43 09/16/18   [name], MD  Lancets MISC Please dispense based on patient and insurance preference. Use as directed to monitor FSBS 1x daily. Dx: E11.9. 09/03/18   Danelle Berry, PA-C  magnesium oxide (MAG-OX) 400 MG tablet Take 1 tablet (400 mg total) by mouth daily. 10/01/18   Burnadette Pop, MD  Metoclopramide HCl 5 MG TBDP Take 5 mg by mouth 3 (three) times daily. 09/13/18   [provider]  ondansetron (ZOFRAN) 4 MG tablet Take 4 mg by mouth every 8 (eight) hours as needed for nausea or vomiting.    [provider]  pantoprazole (PROTONIX) 40 MG tablet Take 40 mg by mouth daily.    [provider]    Physical Exam: Vitals:   10/05/18 0918 10/05/18 1335  BP: 110/70 131/76  Pulse: (!) 101 78  Resp: 16 16  Temp: 97.8 F (36.6 C)   TempSrc: Oral   SpO2: 98% 96%  Weight: 43.1 kg   Height: 5\' 6"  (1.676 m)     Constitutional: NAD, calm, comfortable, cachectic appearing Eyes: PERRL, lids and conjunctivae normal ENMT: Mucous membranes are dry Neck: normal, supple Respiratory: clear to auscultation bilaterally, no wheezing, no crackles. Normal respiratory effort. No accessory muscle use.  Cardiovascular: Regular rate and rhythm, 3/6 SEM. No extremity edema. 2+ pedal pulses.  Abdomen: no tenderness, no masses palpated. Bowel sounds diminished. PEG tube in place Musculoskeletal: no clubbing / cyanosis.  Decreased muscle mass.  Skin: no rashes Neurologic: CN 2-12 grossly intact. Strength 5/5 in all 4.  Psychiatric: Normal judgment and insight. Alert and oriented x 3. Normal mood.   Labs on Admission: I have personally reviewed following labs and imaging studies  CBC: Recent Labs  Lab 09/29/18 0544 09/30/18 0601 10/05/18 1002  WBC 2.6* 2.7* 3.0*  NEUTROABS  --   --  1.7    HGB 11.1* 10.8* 12.6  HCT 35.8* 34.7* 40.3  MCV 87.7 87.6 86.7  PLT 170 172 241   Basic Metabolic Panel: Recent Labs  Lab 09/29/18 0544 09/30/18 0601 10/01/18 0604 10/05/18 1002  NA 139 138 143 139  K 3.5 3.2* 4.0 4.1  CL 110 107 115* 105  CO2 20* 22 23 23   GLUCOSE 100* 84 84 144*  BUN 6* <5* 9 15  CREATININE 0.70 0.60 0.86 0.82  CALCIUM 8.6* 8.6* 9.0 8.7*  MG 1.5* 1.6* 1.7  --    GFR: Estimated Creatinine Clearance: 34.1 mL/min (by C-G formula based on SCr of 0.82 mg/dL). Liver Function Tests: Recent Labs  Lab 10/05/18 1002  AST 30  ALT 23  ALKPHOS 60  BILITOT 1.0  PROT 6.6  ALBUMIN 3.3*  Recent Labs  Lab 10/05/18 1002  LIPASE 43   No results for input(s): AMMONIA in the last 168 hours. Coagulation Profile: No results for input(s): INR, PROTIME in the last 168 hours. Cardiac Enzymes: No results for input(s): CKTOTAL, CKMB, CKMBINDEX, TROPONINI in the last 168 hours. BNP (last 3 results) No results for input(s): PROBNP in the last 8760 hours. HbA1C: No results for input(s): HGBA1C in the last 72 hours. CBG: Recent Labs  Lab 09/30/18 2315 10/01/18 0355 10/01/18 0802 10/01/18 1155 10/01/18 1633  GLUCAP 116* 91 81 118* 99   Lipid Profile: No results for input(s): CHOL, HDL, LDLCALC, TRIG, CHOLHDL, LDLDIRECT in the last 72 hours. Thyroid Function Tests: No results for input(s): TSH, T4TOTAL, FREET4, T3FREE, THYROIDAB in the last 72 hours. Anemia Panel: No results for input(s): VITAMINB12, FOLATE, FERRITIN, TIBC, IRON, RETICCTPCT in the last 72 hours. Urine analysis:    Component Value Date/Time   COLORURINE YELLOW 10/05/2018 1330   APPEARANCEUR CLOUDY (A) 10/05/2018 1330   LABSPEC <1.005 (L) 10/05/2018 1330   PHURINE 7.0 10/05/2018 1330   GLUCOSEU NEGATIVE 10/05/2018 1330   HGBUR SMALL (A) 10/05/2018 1330   BILIRUBINUR NEGATIVE 10/05/2018 1330   KETONESUR NEGATIVE 10/05/2018 1330   PROTEINUR 30 (A) 10/05/2018 1330   NITRITE NEGATIVE  10/05/2018 1330   LEUKOCYTESUR LARGE (A) 10/05/2018 1330     Radiological Exams on Admission: Ct Abdomen Pelvis W Contrast  Result Date: 10/05/2018 CLINICAL DATA:  Recent small bowel obstruction. Recurrent abdominal pain. Vomiting after feeding through PEG tube. EXAM: CT ABDOMEN AND PELVIS WITH CONTRAST TECHNIQUE: Multidetector CT imaging of the abdomen and pelvis was performed using the standard protocol following bolus administration of intravenous contrast. CONTRAST:  15mL ISOVUE-300 IOPAMIDOL (ISOVUE-300) INJECTION 61%, 85mL ISOVUE-300 IOPAMIDOL (ISOVUE-300) INJECTION 61% COMPARISON:  One view abdomen 09/27/2018. CT of the abdomen and pelvis 09/25/2018. FINDINGS: Lower chest: Lung bases are clear part from mild bibasilar atelectasis. There is no nodule or mass lesion. Heart size is normal. No significant pleural or pericardial effusion. Hepatobiliary: Mild intra and extrahepatic biliary dilation is stable. No obstructing lesion is present. No focal hepatic lesions are present. Gallbladder is mildly distended. Pancreas: Unremarkable. Spleen: Within normal limits. Adrenals/Urinary Tract: Adrenal glands are normal bilaterally. Kidneys and ureters are within normal limits. The urinary bladder is normal. Stomach/Bowel: Stomach is mildly distended. Duodenum is unremarkable. Multiple proximal loops of small bowel are distended. There is a transition point in the left lower quadrant. There is no contrast in the colon scratched at there is minimal contrast into the colon. No free air is present. There is some degree of free fluid. Moderate stool is present in the distal sigmoid and rectum. Vascular/Lymphatic: Minimal atherosclerotic changes are present in the aorta without aneurysm. Reproductive: Status post hysterectomy. No adnexal masses. Other: No abdominal wall hernia is evident. Musculoskeletal: Levoconvex curvature is present lumbar spine. Asymmetric sclerotic changes are present on the right at L2-3 and  L3-4. Sclerotic changes are present on the left at L5-S1. Degenerative changes are present in the SI joints bilaterally, right greater than left. Multilevel facet degenerative changes are noted. The hips are located and within normal limits bilaterally. IMPRESSION: 1. Small bowel obstruction with transition point in the left lower quadrant. Minimal contrast is seen into the colon. 2. Moderate stool in the distal sigmoid colon and rectum without distal obstruction. 3. A small amount of free fluid is likely reactive. 4. Stable intra and extrahepatic biliary dilation. 5.  Aortic Atherosclerosis (ICD10-I70.0). Electronically Signed  By: Marin Roberts M.D.   On: 10/05/2018 12:53   Assessment/Plan Active Problems:   SBO (small bowel obstruction) (HCC)    SBO -likely due to adhesions, with transition point, G tube to suction, Dr. Francena Hanly consulted by Dr. Lynelle Doctor, appreciate input -maintain NPO, IVF  DM  -place on SSI  FTT -s/p gastrostomy tube  -now NPO, IVF  Neuroendocrine tumor with carcinomatosis -managed as outpatient  History of DVT -hold Eliquis, if no improvement in her SBO needs heparin gtt within 24h   DVT prophylaxis: Eliquis  Code Status: Full code  Family Communication: No family present at bedside Disposition Plan: home when ready  Consults called: general surgery      Admission status: Observation   At the point of initial evaluation, it is my clinical opinion that admission for OBSERVATION is reasonable and necessary because the patient's presenting complaints in the context of their chronic conditions represent sufficient risk of deterioration or significant morbidity to constitute reasonable grounds for close observation in the hospital setting, but that the patient may be medically stable for discharge from the hospital within 24 to 48 hours.   Pamella Pert, MD [facility] Pager [phone]  If 7PM-7AM, please contact  night-coverage [email or web address] Password [email or web address]  10/05/2018, 3:02 PM

## 2018-10-05 NOTE — ED Notes (Signed)
Finished CT contrast at 1030, tol well

## 2018-10-05 NOTE — ED Notes (Signed)
PEG connected to low wall suction

## 2018-10-05 NOTE — ED Provider Notes (Addendum)
[facility] EMERGENCY DEPARTMENT Provider Note   CSN: [id number] Arrival date & time: 10/05/18  0913     History   Chief Complaint Chief Complaint  Patient presents with  . Emesis    HPI Theresa Howe is a 84 y.o. female.  HPI Pt has history of abd small bowel obstruction.  Patient was admitted to the hospital earlier this month for small bowel obstruction.  Her symptoms were treated  without operative intervention.  Released from hospital on Tuesday.  On Wednesday sx started to return.  She was having nausea with tube feeds no BM.  Saw PCP on Thursday but after the visit started having pain.  She then started to have pain in her abdomen and vomiting with tube feeds.  Still no BM.  She is getting nauseated when she tries to eat or drink rapidly.  She seems to do OK with sipping.  This morning she is feeling okay but she has not eaten anything.  Her daughter is concerned that her bowel obstruction has returned Past Medical History:  Diagnosis Date  . Abdominal mass 10/2017   SMALL BOWEL  . Diabetes mellitus    type 2  . Dyspnea   . Failure to thrive (0-17) 12/24/2017  . Glaucoma   . Hyperlipidemia   . Hypertension   . Neuropathy   . Osteoporosis   . Wheezing     Patient Active Problem List   Diagnosis Date Noted  . History of DVT (deep vein thrombosis) 09/26/2018  . SBO (small bowel obstruction) ([place]) 09/25/2018  . Advance care planning   . On total parenteral nutrition (TPN)   . Dysphagia   . Intra-abdominal abscess ([place])   . Oral thrush   . Palliative care by specialist   . Goals of care, counseling/discussion   . 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 Adult failure to thrive 12/24/2017  . Protein-calorie malnutrition, severe 12/06/2017  . Small bowel mass 12/04/2017  . Protein-calorie malnutrition ([place]) 09/11/2017  . Weight loss 09/11/2017  . Diabetic neuropathy ([place]) 09/28/2015  . Hip pain 09/21/2014  . Muscle ache of extremity 06/12/2014  .  Anxiety state 10/06/2013  . Bladder prolapse, female, acquired 08/03/2013  . Osteoporosis, unspecified 08/03/2013  . Nonspecific (abnormal) findings on radiological and other examination of biliary tract 04/17/2013  . Diabetes with neurologic complications ([place]) 12/20/7251  . HTN (hypertension) 09/18/2011  . Hyperlipidemia LDL goal <70 09/18/2011  . Glaucoma 09/18/2011    Past Surgical History:  Procedure Laterality Date  . ABDOMINAL HYSTERECTOMY    . CATARACT EXTRACTION Right 01/2010  . COLON SURGERY    . COLONOSCOPY    . EUS N/A 04/17/2013   Procedure: UPPER ENDOSCOPIC ULTRASOUND (EUS) LINEAR;  Surgeon: Milus Banister, MD;  Location: [facility];  Service: Endoscopy;  Laterality: N/A;  . GLAUCOMA SURGERY  2007  . IR CM INJ ANY COLONIC TUBE W/FLUORO  09/12/2018  . IR FLUORO GUIDE CV LINE RIGHT  01/17/2018  . IR GASTROSTOMY TUBE MOD SED  04/11/2018  . IR IMAGE GUIDED DRAINAGE PERCUT CATH  PERITONEAL RETROPERIT  01/09/2018  . IR REMOVAL TUN CV CATH W/O FL  06/19/2018  . IR SINUS/FIST TUBE CHK-NON GI  01/18/2018  . IR US GUIDE VASC ACCESS RIGHT  01/17/2018  . LAPAROSCOPIC SMALL BOWEL RESECTION  12/04/2017  . LAPAROSCOPIC SMALL BOWEL RESECTION N/A 12/04/2017   Procedure: LAPAROSCOPIC SMALL BOWEL RESECTION;  Surgeon: Stark Klein, MD;  Location: [facility];  Service: General;  Laterality: N/A;  .  LAPAROSCOPY N/A 12/04/2017   Procedure: LAPAROSCOPY DIAGNOSTIC;  Surgeon: Stark Klein, MD;  Location: [facility];  Service: General;  Laterality: N/A;  . LAPAROSCOPY N/A 12/27/2017   Procedure: DIAGNOSTIC LAPAROSCOPY/ LAPAROTOMY WITH SMALL BOWEL RESECTION TIMES TWO;  Surgeon: Stark Klein, MD;  Location: [facility];  Service: General;  Laterality: N/A;  . MULTIPLE TOOTH EXTRACTIONS    . PICC LINE INSERTION  12/24/2017     OB History   None      Home Medications    Prior to Admission medications   Medication Sig Start Date End Date Taking? Authorizing Provider  acetaminophen (TYLENOL) 325 MG tablet Take  650 mg by mouth every 6 (six) hours as needed for mild pain, moderate pain or headache.     [provider]  apixaban (ELIQUIS) 2.5 MG TABS tablet Take 2.5 mg by mouth 2 (two) times daily.    [provider]  Blood Glucose Monitoring Suppl (BLOOD GLUCOSE SYSTEM PAK) KIT Please dispense based on patient and insurance preference. Use as directed to monitor FSBS 1x daily. Dx: E11.9. 09/03/18   Delsa Grana, PA-C  Glucose Blood (BLOOD GLUCOSE TEST STRIPS) STRP Please dispense based on patient and insurance preference. Use as directed to monitor FSBS 1x daily. Dx: E11.9. 09/03/18   Delsa Grana, PA-C  insulin detemir (LEVEMIR) 100 UNIT/ML injection Inject 4 Units into the skin at bedtime.     [provider]  Insulin Pen Needle (BD PEN NEEDLE MICRO U/F) 32G X 6 MM MISC Use with insulin pen ICD10:e11.43 09/16/18   Alycia Rossetti, MD  Lancets MISC Please dispense based on patient and insurance preference. Use as directed to monitor FSBS 1x daily. Dx: E11.9. 09/03/18   Delsa Grana, PA-C  magnesium oxide (MAG-OX) 400 MG tablet Take 1 tablet (400 mg total) by mouth daily. 10/01/18   Shelly Coss, MD  Metoclopramide HCl 5 MG TBDP Take 5 mg by mouth 3 (three) times daily. 09/13/18   [provider]  ondansetron (ZOFRAN) 4 MG tablet Take 4 mg by mouth every 8 (eight) hours as needed for nausea or vomiting.    [provider]  pantoprazole (PROTONIX) 40 MG tablet Take 40 mg by mouth daily.    [provider]    Family History No family history on file.  Social History Social History   Tobacco Use  . Smoking status: Never Smoker  . Smokeless tobacco: Never Used  Substance Use Topics  . Alcohol use: No  . Drug use: No     Allergies   Patient has no known allergies.   Review of Systems Review of Systems  All other systems reviewed and are negative.    Physical Exam Updated Vital Signs BP 110/70 (BP Location: Left Arm)   Pulse (!) 101    Temp 97.8 F (36.6 C) (Oral)   Resp 16   Ht 1.676 m ('5\' 6"' )   Wt 43.1 kg   SpO2 98%   BMI 15.33 kg/m   Physical Exam  Constitutional: No distress.  Frail, elderly  HENT:  Head: Normocephalic and atraumatic.  Right Ear: External ear normal.  Left Ear: External ear normal.  Eyes: Conjunctivae are normal. Right eye exhibits no discharge. Left eye exhibits no discharge. No scleral icterus.  Neck: Neck supple. No tracheal deviation present.  Cardiovascular: Normal rate, regular rhythm and intact distal pulses.  Pulmonary/Chest: Effort normal and breath sounds normal. No stridor. No respiratory distress. She has no wheezes. She has no rales.  Abdominal: Soft. Bowel sounds are normal. She exhibits no distension. There is no tenderness. There is no rebound and no guarding.  Status post gastrostomy tube, no erythema around the wound, well-healed midline surgical scar in the abdomen  Musculoskeletal: She exhibits no edema or tenderness.  Neurological: She is alert. She has normal strength. No cranial nerve deficit (no facial droop, extraocular movements intact, no slurred speech) or sensory deficit. She exhibits normal muscle tone. She displays no seizure activity. Coordination normal.  Skin: Skin is warm and dry. No rash noted.  Psychiatric: She has a normal mood and affect.  Nursing note and vitals reviewed.    ED Treatments / Results  Labs (all labs ordered are listed, but only abnormal results are displayed) Labs Reviewed  COMPREHENSIVE METABOLIC PANEL - Abnormal; Notable for the following components:      Result Value   Glucose, Bld 144 (*)    Calcium 8.7 (*)    Albumin 3.3 (*)    All other components within normal limits  CBC WITH DIFFERENTIAL/PLATELET - Abnormal; Notable for the following components:   WBC 3.0 (*)    RDW 16.6 (*)    All other components within normal limits  LIPASE, BLOOD  URINALYSIS, ROUTINE W REFLEX MICROSCOPIC    EKG None  Radiology Ct Abdomen  Pelvis W Contrast  Result Date: 10/05/2018 CLINICAL DATA:  Recent small bowel obstruction. Recurrent abdominal pain. Vomiting after feeding through PEG tube. EXAM: CT ABDOMEN AND PELVIS WITH CONTRAST TECHNIQUE: Multidetector CT imaging of the abdomen and pelvis was performed using the standard protocol following bolus administration of intravenous contrast. CONTRAST:  76m ISOVUE-300 IOPAMIDOL (ISOVUE-300) INJECTION 61%, 896mISOVUE-300 IOPAMIDOL (ISOVUE-300) INJECTION 61% COMPARISON:  One view abdomen 09/27/2018. CT of the abdomen and pelvis 09/25/2018. FINDINGS: Lower chest: Lung bases are clear part from mild bibasilar atelectasis. There is no nodule or mass lesion. Heart size is normal. No significant pleural or pericardial effusion. Hepatobiliary: Mild intra and extrahepatic biliary dilation is stable. No obstructing lesion is present. No focal hepatic lesions are present. Gallbladder is mildly distended. Pancreas: Unremarkable. Spleen: Within normal limits. Adrenals/Urinary Tract: Adrenal glands are normal bilaterally. Kidneys and ureters are within normal limits. The urinary bladder is normal. Stomach/Bowel: Stomach is mildly distended. Duodenum is unremarkable. Multiple proximal loops of small bowel are distended. There is a transition point in the left lower quadrant. There is no contrast in the colon scratched at there is minimal contrast into the colon. No free air is present. There is some degree of free fluid. Moderate stool is present in the distal sigmoid and rectum. Vascular/Lymphatic: Minimal atherosclerotic changes are present in the aorta without aneurysm. Reproductive: Status post hysterectomy. No adnexal masses. Other: No abdominal wall hernia is evident. Musculoskeletal: Levoconvex curvature is present lumbar spine. Asymmetric sclerotic changes are present on the right at L2-3 and L3-4. Sclerotic changes are present on the left at L5-S1. Degenerative changes are present in the SI joints  bilaterally, right greater than left. Multilevel facet degenerative changes are noted. The hips are located and within normal limits bilaterally. IMPRESSION: 1. Small bowel obstruction with transition point in the left lower quadrant. Minimal contrast is seen into the colon. 2. Moderate stool in the distal sigmoid colon and rectum without distal obstruction. 3. A small amount of free fluid is likely reactive. 4. Stable intra and extrahepatic biliary dilation. 5.  Aortic Atherosclerosis (ICD10-I70.0). Electronically Signed   By: ChSan Morelle.D.   On: 10/05/2018 12:53    Procedures  Procedures (including critical care time)  Medications Ordered in ED Medications  sodium chloride 0.9 % bolus 500 mL (0 mLs Intravenous Stopped 10/05/18 1121)    And  0.9 %  sodium chloride infusion ( Intravenous Rate/Dose Change 10/05/18 1100)  ondansetron (ZOFRAN) injection 4 mg (4 mg Intravenous Given 10/05/18 1015)  iopamidol (ISOVUE-300) 61 % injection 100 mL (85 mLs Intravenous Contrast Given 10/05/18 1158)  iopamidol (ISOVUE-300) 61 % injection 30 mL (15 mLs Oral Contrast Given 10/05/18 1157)     Initial Impression / Assessment and Plan / ED Course  I have reviewed the triage vital signs and the nursing notes.  Pertinent labs & imaging results that were available during my care of the patient were reviewed by me and considered in my medical decision making (see chart for details).  Patient's laboratory tests are essentially normal.  CT scan however demonstrates recurrent small bowel obstruction with a transition point in the left lower quadrant.  Patient has not had any vomiting currently in the emergency room.  We will place her gastric tube to suction.  I will consult with the medical service to arrange for transfer and admission Clinical Course as of Oct 06 1335  Sat Oct 05, 2018  1320 CT scan shows recurrent bowel obstruction.  We will place the patient's G-tube to suction   [JK]  1336 D/w Dr  Letta Median.  Will transfer to [facility].  I will consult general surgery to ask them to see her when she gets to [facility]   [JK]    Clinical Course User Index [JK] Dorie Rank, MD    Patient presented to the emergency room with symptoms concerning for recurrent bowel obstruction.  She was recently in the hospital for small bowel obstruction.  Final Clinical Impressions(s) / ED Diagnoses   Final diagnoses:  SBO (small bowel obstruction) ([facility])      Dorie Rank, MD 10/05/18 1323    DIscussed case With Dr Kieth Brightly.  He will consult on pt at [facility].    Dorie Rank, MD 10/05/18 1340

## 2018-10-06 ENCOUNTER — Observation Stay (HOSPITAL_COMMUNITY): Payer: Medicare Other

## 2018-10-06 ENCOUNTER — Inpatient Hospital Stay (HOSPITAL_COMMUNITY): Payer: Medicare Other

## 2018-10-06 DIAGNOSIS — K5669 Other partial intestinal obstruction: Secondary | ICD-10-CM | POA: Diagnosis not present

## 2018-10-06 DIAGNOSIS — R112 Nausea with vomiting, unspecified: Secondary | ICD-10-CM | POA: Diagnosis present

## 2018-10-06 DIAGNOSIS — I10 Essential (primary) hypertension: Secondary | ICD-10-CM | POA: Diagnosis present

## 2018-10-06 DIAGNOSIS — E1143 Type 2 diabetes mellitus with diabetic autonomic (poly)neuropathy: Secondary | ICD-10-CM | POA: Diagnosis not present

## 2018-10-06 DIAGNOSIS — E43 Unspecified severe protein-calorie malnutrition: Secondary | ICD-10-CM | POA: Diagnosis present

## 2018-10-06 DIAGNOSIS — E785 Hyperlipidemia, unspecified: Secondary | ICD-10-CM | POA: Diagnosis present

## 2018-10-06 DIAGNOSIS — K56609 Unspecified intestinal obstruction, unspecified as to partial versus complete obstruction: Secondary | ICD-10-CM | POA: Diagnosis not present

## 2018-10-06 DIAGNOSIS — Z794 Long term (current) use of insulin: Secondary | ICD-10-CM | POA: Diagnosis not present

## 2018-10-06 DIAGNOSIS — C786 Secondary malignant neoplasm of retroperitoneum and peritoneum: Secondary | ICD-10-CM | POA: Diagnosis present

## 2018-10-06 DIAGNOSIS — H409 Unspecified glaucoma: Secondary | ICD-10-CM | POA: Diagnosis present

## 2018-10-06 DIAGNOSIS — E876 Hypokalemia: Secondary | ICD-10-CM | POA: Diagnosis not present

## 2018-10-06 DIAGNOSIS — Z90710 Acquired absence of both cervix and uterus: Secondary | ICD-10-CM | POA: Diagnosis not present

## 2018-10-06 DIAGNOSIS — R627 Adult failure to thrive: Secondary | ICD-10-CM | POA: Diagnosis present

## 2018-10-06 DIAGNOSIS — Z7901 Long term (current) use of anticoagulants: Secondary | ICD-10-CM | POA: Diagnosis not present

## 2018-10-06 DIAGNOSIS — Z931 Gastrostomy status: Secondary | ICD-10-CM | POA: Diagnosis not present

## 2018-10-06 DIAGNOSIS — Z79899 Other long term (current) drug therapy: Secondary | ICD-10-CM | POA: Diagnosis not present

## 2018-10-06 DIAGNOSIS — Z86718 Personal history of other venous thrombosis and embolism: Secondary | ICD-10-CM | POA: Diagnosis not present

## 2018-10-06 DIAGNOSIS — M810 Age-related osteoporosis without current pathological fracture: Secondary | ICD-10-CM | POA: Diagnosis present

## 2018-10-06 DIAGNOSIS — K56600 Partial intestinal obstruction, unspecified as to cause: Secondary | ICD-10-CM | POA: Diagnosis not present

## 2018-10-06 DIAGNOSIS — Z9049 Acquired absence of other specified parts of digestive tract: Secondary | ICD-10-CM | POA: Diagnosis not present

## 2018-10-06 DIAGNOSIS — E1140 Type 2 diabetes mellitus with diabetic neuropathy, unspecified: Secondary | ICD-10-CM | POA: Diagnosis present

## 2018-10-06 DIAGNOSIS — Z681 Body mass index (BMI) 19 or less, adult: Secondary | ICD-10-CM | POA: Diagnosis not present

## 2018-10-06 DIAGNOSIS — Z85068 Personal history of other malignant neoplasm of small intestine: Secondary | ICD-10-CM | POA: Diagnosis not present

## 2018-10-06 LAB — CBC
HCT: 33.4 % — ABNORMAL LOW (ref 36.0–46.0)
Hemoglobin: 10.2 g/dL — ABNORMAL LOW (ref 12.0–15.0)
MCH: 27 pg (ref 26.0–34.0)
MCHC: 30.5 g/dL (ref 30.0–36.0)
MCV: 88.4 fL (ref 80.0–100.0)
NRBC: 0 % (ref 0.0–0.2)
Platelets: 201 10*3/uL (ref 150–400)
RBC: 3.78 MIL/uL — AB (ref 3.87–5.11)
RDW: 16.8 % — ABNORMAL HIGH (ref 11.5–15.5)
WBC: 2.7 10*3/uL — ABNORMAL LOW (ref 4.0–10.5)

## 2018-10-06 LAB — COMPREHENSIVE METABOLIC PANEL
ALBUMIN: 2.9 g/dL — AB (ref 3.5–5.0)
ALK PHOS: 50 U/L (ref 38–126)
ALT: 18 U/L (ref 0–44)
ANION GAP: 4 — AB (ref 5–15)
AST: 20 U/L (ref 15–41)
BUN: 11 mg/dL (ref 8–23)
CO2: 27 mmol/L (ref 22–32)
Calcium: 8.1 mg/dL — ABNORMAL LOW (ref 8.9–10.3)
Chloride: 108 mmol/L (ref 98–111)
Creatinine, Ser: 0.74 mg/dL (ref 0.44–1.00)
GFR calc non Af Amer: >60 mL/min (ref >60)
Glucose, Bld: 105 mg/dL — ABNORMAL HIGH (ref 70–99)
Potassium: 3.3 mmol/L — ABNORMAL LOW (ref 3.5–5.1)
Sodium: 139 mmol/L (ref 135–145)
TOTAL PROTEIN: 5.3 g/dL — AB (ref 6.5–8.1)
Total Bilirubin: 0.9 mg/dL (ref 0.3–1.2)

## 2018-10-06 LAB — GLUCOSE, CAPILLARY
GLUCOSE-CAPILLARY: 106 mg/dL — AB (ref 70–99)
GLUCOSE-CAPILLARY: 114 mg/dL — AB (ref 70–99)
GLUCOSE-CAPILLARY: 83 mg/dL (ref 70–99)
Glucose-Capillary: 81 mg/dL (ref 70–99)

## 2018-10-06 LAB — MAGNESIUM: Magnesium: 1.5 mg/dL — ABNORMAL LOW (ref 1.7–2.4)

## 2018-10-06 MED ORDER — POTASSIUM CHLORIDE 10 MEQ/100ML IV SOLN
10.0000 meq | INTRAVENOUS | Status: AC
  Administered 2018-10-06 (×3): 10 meq via INTRAVENOUS
  Filled 2018-10-06 (×2): qty 100

## 2018-10-06 MED ORDER — MAGNESIUM SULFATE 2 GM/50ML IV SOLN
2.0000 g | Freq: Once | INTRAVENOUS | Status: AC
  Administered 2018-10-07: 2 g via INTRAVENOUS
  Filled 2018-10-06: qty 50

## 2018-10-06 MED ORDER — ENOXAPARIN SODIUM 30 MG/0.3ML ~~LOC~~ SOLN
30.0000 mg | SUBCUTANEOUS | Status: DC
  Administered 2018-10-06: 30 mg via SUBCUTANEOUS
  Filled 2018-10-06: qty 0.3

## 2018-10-06 MED ORDER — ENOXAPARIN SODIUM 40 MG/0.4ML ~~LOC~~ SOLN: 40.0000 mg | SUBCUTANEOUS | Status: DC

## 2018-10-06 MED ORDER — POTASSIUM CHLORIDE 10 MEQ/100ML IV SOLN
INTRAVENOUS | Status: AC
  Administered 2018-10-06: 10 meq
  Filled 2018-10-06: qty 100

## 2018-10-06 MED ORDER — DIATRIZOATE MEGLUMINE & SODIUM 66-10 % PO SOLN
90.0000 mL | Freq: Once | ORAL | Status: AC
  Administered 2018-10-06: 90 mL
  Filled 2018-10-06: qty 90

## 2018-10-06 MED ORDER — POTASSIUM CHLORIDE 10 MEQ/100ML IV SOLN
INTRAVENOUS | Status: AC
  Administered 2018-10-06: 10 meq via INTRAVENOUS
  Filled 2018-10-06: qty 100

## 2018-10-06 NOTE — Consult Note (Signed)
[facility] Surgery Consult Note  Theresa Howe 03-13-1931  [id number].    Chief Complaint: Nausea and vomiting associated with G tube feedings  HPI:   Patient is an 84yr old female with history of neuroendocrine tumor, carcinomatosis, history of two ex laps for bowel resection by Dr. Donell Beers, s/p G tube placement, diabetes, hx of DVT (on Eliquis) who presents with NV associated with G tube feedings. Pt was recently discharged on 10/15 for a SBO which had improved during her stay. Shortly after returning home, she began experiencing nausea and says she was unable to take in any food. She vomited "a couple of times" and says "something about her bowels isn't working right." She has not had a BM in the last 2 days. She denies chest pain, fever, SOB, abd pain, or hematemesis.   ROS: Review of Systems  Constitutional: Negative for chills and fever.  Respiratory: Negative for shortness of breath.   Cardiovascular: Negative for chest pain.  Gastrointestinal: Positive for nausea and vomiting. Negative for abdominal pain.  Genitourinary: Negative for dysuria.    No family history on file.  Past Medical History:  Diagnosis Date  . Abdominal mass 10/2017   SMALL BOWEL  . Diabetes mellitus    type 2  . Dyspnea   . Failure to thrive (0-17) 12/24/2017  . Glaucoma   . Hyperlipidemia   . Hypertension   . Neuropathy   . Osteoporosis   . Wheezing     Past Surgical History:  Procedure Laterality Date  . ABDOMINAL HYSTERECTOMY    . CATARACT EXTRACTION Right 01/2010  . COLON SURGERY    . COLONOSCOPY    . EUS N/A 04/17/2013   Procedure: UPPER ENDOSCOPIC ULTRASOUND (EUS) LINEAR;  Surgeon: Rachael Fee, MD;  Location: [facility];  Service: Endoscopy;  Laterality: N/A;  . GLAUCOMA SURGERY  2007  . IR CM INJ ANY COLONIC TUBE W/FLUORO  09/12/2018  . IR FLUORO GUIDE CV LINE RIGHT  01/17/2018  . IR GASTROSTOMY TUBE MOD SED  04/11/2018  . IR IMAGE GUIDED DRAINAGE PERCUT CATH  PERITONEAL  RETROPERIT  01/09/2018  . IR REMOVAL TUN CV CATH W/O FL  06/19/2018  . IR SINUS/FIST TUBE CHK-NON GI  01/18/2018  . IR US GUIDE VASC ACCESS RIGHT  01/17/2018  . LAPAROSCOPIC SMALL BOWEL RESECTION  12/04/2017  . LAPAROSCOPIC SMALL BOWEL RESECTION N/A 12/04/2017   Procedure: LAPAROSCOPIC SMALL BOWEL RESECTION;  Surgeon: Almond Lint, MD;  Location: [facility] OR;  Service: General;  Laterality: N/A;  . LAPAROSCOPY N/A 12/04/2017   Procedure: LAPAROSCOPY DIAGNOSTIC;  Surgeon: Almond Lint, MD;  Location: [facility] OR;  Service: General;  Laterality: N/A;  . LAPAROSCOPY N/A 12/27/2017   Procedure: DIAGNOSTIC LAPAROSCOPY/ LAPAROTOMY WITH SMALL BOWEL RESECTION TIMES TWO;  Surgeon: Almond Lint, MD;  Location: [facility] OR;  Service: General;  Laterality: N/A;  . MULTIPLE TOOTH EXTRACTIONS    . PICC LINE INSERTION  12/24/2017    Social History:  reports that she has never smoked. She has never used smokeless tobacco. She reports that she does not drink alcohol or use drugs.  Allergies: No Known Allergies  Medications Prior to Admission  Medication Sig Dispense Refill  . acetaminophen (TYLENOL) 325 MG tablet Take 650 mg by mouth every 6 (six) hours as needed for mild pain, moderate pain or headache.     Marland Kitchen apixaban (ELIQUIS) 2.5 MG TABS tablet Take 2.5 mg by mouth 2 (two) times daily.    . Blood Glucose Monitoring Suppl (BLOOD  GLUCOSE SYSTEM PAK) KIT Please dispense based on patient and insurance preference. Use as directed to monitor FSBS 1x daily. Dx: E11.9. 1 each 0  . Glucose Blood (BLOOD GLUCOSE TEST STRIPS) STRP Please dispense based on patient and insurance preference. Use as directed to monitor FSBS 1x daily. Dx: E11.9. 50 each 11  . insulin detemir (LEVEMIR) 100 UNIT/ML injection Inject 4 Units into the skin at bedtime.     . Insulin Pen Needle (BD PEN NEEDLE MICRO U/F) 32G X 6 MM MISC Use with insulin pen ICD10:e11.43 100 each 1  . Lancets MISC Please dispense based on patient and insurance preference. Use as  directed to monitor FSBS 1x daily. Dx: E11.9. 50 each 11  . magnesium oxide (MAG-OX) 400 MG tablet Take 1 tablet (400 mg total) by mouth daily. 14 tablet 0  . Metoclopramide HCl 5 MG TBDP Take 5 mg by mouth 3 (three) times daily.  0  . ondansetron (ZOFRAN) 4 MG tablet Take 4 mg by mouth every 8 (eight) hours as needed for nausea or vomiting.    . pantoprazole (PROTONIX) 40 MG tablet Take 40 mg by mouth daily.      Blood pressure (!) 104/57, pulse (!) 59, temperature 98.4 F (36.9 C), temperature source Oral, resp. rate 20, height 5\' 6"  (1.676 m), weight 43.1 kg, SpO2 97 %. Physical Exam: Physical Exam  Constitutional: She appears cachectic.  Patient in NAD. Pleasant and lying comfortably in bed.  Cardiovascular: Normal rate, regular rhythm and normal heart sounds.  Pulmonary/Chest: Effort normal and breath sounds normal.  Abdominal: Soft. She exhibits no distension and no ascites. Bowel sounds are decreased. There is no tenderness. There is no guarding.  G tube present, appears properly in place.  Neurological: She is alert.  Patient Ax1, only knowing her location, but not the year or president. This appears to be her baseline.  Skin: Skin is warm and dry.  Psychiatric: She has a normal mood and affect.    Results for orders placed or performed during the hospital encounter of 10/05/18 (from the past 48 hour(s))  Comprehensive metabolic panel     Status: Abnormal   Collection Time: 10/05/18 10:02 AM  Result Value Ref Range   Sodium 139 135 - 145 mmol/L   Potassium 4.1 3.5 - 5.1 mmol/L   Chloride 105 98 - 111 mmol/L   CO2 23 22 - 32 mmol/L   Glucose, Bld 144 (H) 70 - 99 mg/dL   BUN 15 8 - 23 mg/dL   Creatinine, Ser 1.61 0.44 - 1.00 mg/dL   Calcium 8.7 (L) 8.9 - 10.3 mg/dL   Total Protein 6.6 6.5 - 8.1 g/dL   Albumin 3.3 (L) 3.5 - 5.0 g/dL   AST 30 15 - 41 U/L   ALT 23 0 - 44 U/L   Alkaline Phosphatase 60 38 - 126 U/L   Total Bilirubin 1.0 0.3 - 1.2 mg/dL   GFR calc non Af Amer  >60 >60 mL/min   GFR calc Af Amer >60 >60 mL/min    Comment: (NOTE) The eGFR has been calculated using the CKD EPI equation. This calculation has not been validated in all clinical situations. eGFR's persistently <60 mL/min signify possible Chronic Kidney Disease.    Anion gap 11 5 - 15    Comment: Performed at [facility], [address] [place], [place] [address]  Lipase, blood     Status: None   Collection Time: 10/05/18 10:02 AM  Result  Value Ref Range   Lipase 43 11 - 51 U/L    Comment: Performed at [facility], [address] [place], [place] [address]  CBC with Diff     Status: Abnormal   Collection Time: 10/05/18 10:02 AM  Result Value Ref Range   WBC 3.0 (L) 4.0 - 10.5 K/uL   RBC 4.65 3.87 - 5.11 MIL/uL   Hemoglobin 12.6 12.0 - 15.0 g/dL   HCT 64.4 03.4 - 74.2 %   MCV 86.7 80.0 - 100.0 fL   MCH 27.1 26.0 - 34.0 pg   MCHC 31.3 30.0 - 36.0 g/dL   RDW 59.5 (H) 63.8 - 75.6 %   Platelets 241 150 - 400 K/uL   nRBC 0.0 0.0 - 0.2 %   Neutrophils Relative % 56 %   Neutro Abs 1.7 1.7 - 7.7 K/uL   Lymphocytes Relative 30 %   Lymphs Abs 0.9 0.7 - 4.0 K/uL   Monocytes Relative 11 %   Monocytes Absolute 0.4 0.1 - 1.0 K/uL   Eosinophils Relative 2 %   Eosinophils Absolute 0.1 0.0 - 0.5 K/uL   Basophils Relative 1 %   Basophils Absolute 0.0 0.0 - 0.1 K/uL   Immature Granulocytes 0 %   Abs Immature Granulocytes 0.01 0.00 - 0.07 K/uL    Comment: Performed at [facility], [address] [place], [place] [address]  Urinalysis, Routine w reflex microscopic     Status: Abnormal   Collection Time: 10/05/18  1:30 PM  Result Value Ref Range   Color, Urine YELLOW YELLOW   APPearance CLOUDY (A) CLEAR   Specific Gravity, Urine <1.005 (L) 1.005 - 1.030   pH 7.0 5.0 - 8.0   Glucose, UA NEGATIVE NEGATIVE mg/dL   Hgb urine dipstick SMALL (A) NEGATIVE   Bilirubin Urine NEGATIVE NEGATIVE   Ketones, ur NEGATIVE NEGATIVE mg/dL   Protein, ur 30 (A)  NEGATIVE mg/dL   Nitrite NEGATIVE NEGATIVE   Leukocytes, UA LARGE (A) NEGATIVE    Comment: Performed at [facility], [address] [place], [place] [address]  Urinalysis, Microscopic (reflex)     Status: Abnormal   Collection Time: 10/05/18  1:30 PM  Result Value Ref Range   RBC / HPF 0-5 0 - 5 RBC/hpf   WBC, UA >50 0 - 5 WBC/hpf   Bacteria, UA MANY (A) NONE SEEN   Squamous Epithelial / LPF 0-5 0 - 5   Non Squamous Epithelial PRESENT (A) NONE SEEN   WBC Clumps PRESENT     Comment: Performed at [facility], [address] [place], [place] [address]  Glucose, capillary     Status: Abnormal   Collection Time: 10/05/18  5:03 PM  Result Value Ref Range   Glucose-Capillary 60 (L) 70 - 99 mg/dL  Glucose, capillary     Status: None   Collection Time: 10/05/18  6:28 PM  Result Value Ref Range   Glucose-Capillary 81 70 - 99 mg/dL  Glucose, capillary     Status: None   Collection Time: 10/05/18  9:30 PM  Result Value Ref Range   Glucose-Capillary 80 70 - 99 mg/dL  Comprehensive metabolic panel     Status: Abnormal   Collection Time: 10/06/18  4:13 AM  Result Value Ref Range   Sodium 139 135 - 145 mmol/L   Potassium 3.3 (L) 3.5 - 5.1 mmol/L   Chloride 108 98 - 111 mmol/L   CO2 27  22 - 32 mmol/L   Glucose, Bld 105 (H) 70 - 99 mg/dL   BUN 11 8 - 23 mg/dL   Creatinine, Ser 4.09 0.44 - 1.00 mg/dL   Calcium 8.1 (L) 8.9 - 10.3 mg/dL   Total Protein 5.3 (L) 6.5 - 8.1 g/dL   Albumin 2.9 (L) 3.5 - 5.0 g/dL   AST 20 15 - 41 U/L   ALT 18 0 - 44 U/L   Alkaline Phosphatase 50 38 - 126 U/L   Total Bilirubin 0.9 0.3 - 1.2 mg/dL   GFR calc non Af Amer >60 >60 mL/min   GFR calc Af Amer >60 >60 mL/min    Comment: (NOTE) The eGFR has been calculated using the CKD EPI equation. This calculation has not been validated in all clinical situations. eGFR's persistently <60 mL/min signify possible Chronic Kidney Disease.    Anion gap 4 (L) 5 - 15    Comment: Performed at  [facility], [address], [place], [place] [address]  CBC     Status: Abnormal   Collection Time: 10/06/18  4:13 AM  Result Value Ref Range   WBC 2.7 (L) 4.0 - 10.5 K/uL   RBC 3.78 (L) 3.87 - 5.11 MIL/uL   Hemoglobin 10.2 (L) 12.0 - 15.0 g/dL   HCT 47.8 (L) 29.5 - 62.1 %   MCV 88.4 80.0 - 100.0 fL   MCH 27.0 26.0 - 34.0 pg   MCHC 30.5 30.0 - 36.0 g/dL   RDW 30.8 (H) 65.7 - 84.6 %   Platelets 201 150 - 400 K/uL   nRBC 0.0 0.0 - 0.2 %    Comment: Performed at [facility], [address], [place], [place] [address]  Magnesium     Status: Abnormal   Collection Time: 10/06/18  4:13 AM  Result Value Ref Range   Magnesium 1.5 (L) 1.7 - 2.4 mg/dL    Comment: Performed at [facility], [address], [place], [place] [address]  Glucose, capillary     Status: Abnormal   Collection Time: 10/06/18  7:10 AM  Result Value Ref Range   Glucose-Capillary 106 (H) 70 - 99 mg/dL   Ct Abdomen Pelvis W Contrast  Result Date: 10/05/2018 CLINICAL DATA:  Recent small bowel obstruction. Recurrent abdominal pain. Vomiting after feeding through PEG tube. EXAM: CT ABDOMEN AND PELVIS WITH CONTRAST TECHNIQUE: Multidetector CT imaging of the abdomen and pelvis was performed using the standard protocol following bolus administration of intravenous contrast. CONTRAST:  15mL ISOVUE-300 IOPAMIDOL (ISOVUE-300) INJECTION 61%, 85mL ISOVUE-300 IOPAMIDOL (ISOVUE-300) INJECTION 61% COMPARISON:  One view abdomen 09/27/2018. CT of the abdomen and pelvis 09/25/2018. FINDINGS: Lower chest: Lung bases are clear part from mild bibasilar atelectasis. There is no nodule or mass lesion. Heart size is normal. No significant pleural or pericardial effusion. Hepatobiliary: Mild intra and extrahepatic biliary dilation is stable. No obstructing lesion is present. No focal hepatic lesions are present. Gallbladder is mildly distended. Pancreas: Unremarkable. Spleen: Within normal  limits. Adrenals/Urinary Tract: Adrenal glands are normal bilaterally. Kidneys and ureters are within normal limits. The urinary bladder is normal. Stomach/Bowel: Stomach is mildly distended. Duodenum is unremarkable. Multiple proximal loops of small bowel are distended. There is a transition point in the left lower quadrant. There is no contrast in the colon scratched at there is minimal contrast into the colon. No free air is present. There is some degree of free fluid. Moderate stool is present in the distal sigmoid and rectum.  Vascular/Lymphatic: Minimal atherosclerotic changes are present in the aorta without aneurysm. Reproductive: Status post hysterectomy. No adnexal masses. Other: No abdominal wall hernia is evident. Musculoskeletal: Levoconvex curvature is present lumbar spine. Asymmetric sclerotic changes are present on the right at L2-3 and L3-4. Sclerotic changes are present on the left at L5-S1. Degenerative changes are present in the SI joints bilaterally, right greater than left. Multilevel facet degenerative changes are noted. The hips are located and within normal limits bilaterally. IMPRESSION: 1. Small bowel obstruction with transition point in the left lower quadrant. Minimal contrast is seen into the colon. 2. Moderate stool in the distal sigmoid colon and rectum without distal obstruction. 3. A small amount of free fluid is likely reactive. 4. Stable intra and extrahepatic biliary dilation. 5.  Aortic Atherosclerosis (ICD10-I70.0). Electronically Signed   By: Marin Roberts M.D.   On: 10/05/2018 12:53      Assessment/Plan -SBO: CTA shows SBO with LLQ transition. This is consistent with her hx and presentation.  -FTT: G tube in place.  -Hypokalemia: Potassium 3.3, likely due to vomiting -WBC: 2.7, this appears to be her baseline and likely due to the NET and carcinomatosis -Hgb: 10.2, near her baseline and likely due to IV hydration  -Pt resting comfortably in bed and reports  no abd pain or discomfort at this time. Will continue to monitor and provide supportive care. -Remain NPO -N/V: continue zofran prn -Hypokalemia: replace and repeat labs tomorrow. -Diabetes: Continue sliding scale insulin and monitor glucose. -Hx of DVT: Continue to hold Eliquis. Will evaluate for IV hep -Given her hx and current condition, it is unlikely that pt would tolerate surgery. Will consider discussion of palliative care with patient and her family.   Celedonio Miyamoto, PA-S

## 2018-10-06 NOTE — Progress Notes (Signed)
PROGRESS NOTE    JAYLEIGH ZEIGER  VHQ:[id number] DOB: Feb 11, 1931 DOA: 10/05/2018 PCP: [name], MD   Brief Narrative: Patient is 84 year old female with past medical history  neuroendocrine tumor status post resection with low-grade carcinomatosis,failure to thrive with gastrostomy tube, history of 2 exploratory laparotomy for bowel resection by Dr. Donell Beers, diabetes mellitus, DVT on Eliquis who presents to the emergency department with complaints of nausea/vomiting.  She was just discharged 5 days ago after being managed for SBO which improved with conservative management.  She was feeling okay at home for 2 days but again started having nausea, abdominal discomfort and did not have any bowel movement for last 2 days.  CT abdomen/pelvis on this admission again showed SBO with transition point at left lower quadrant.  Surgery following.   Assessment & Plan:   Active Problems:   SBO (small bowel obstruction) (HCC)   SBO: Presented with above symptoms.Denies any abdominal pain, nausea or vomiting during my evaluation today.Has been passing flatus on/off. Continue NPO. Again started with conservative management, gastrostomy tube on suction.  Continue IV fluids. Surgery not contemplating  surgical options due to her current medical problems.  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 She will follow-up with oncology as an outpatient as per her PCP.  Failure to thrive:Status post gastrostomy tube with tube feeding at home.  History of DVT: On Eliquis at home. Eliquis has been held.If SBO does not resolve by tomorrow, and she remains n.p.o., please start her on heparin drip in previous admission.  Hypokalemia/hypomagnesemia: Being supplemented.Will check the levels tomorrow.  Diabetes: Continue sliding scale insulin.   DVT prophylaxis: Lovenox Code Status: Full Family Communication: None present at the  bedside Disposition Plan: Home after resolution of SBO   Consultants: Surgery  Procedures: None  Antimicrobials: None  Subjective: Patient seen and examined at bedside this morning.  He does not complain of any abdominal pain, nausea or vomiting at present.  Feels much better today.  Objective: Vitals:   10/05/18 1335 10/05/18 1711 10/05/18 2132 10/06/18 0552  BP: 131/76 (!) 100/59 (!) 106/59 (!) 104/57  Pulse: 78 69 70 (!) 59  Resp: 16 16 20 20   Temp:  98.5 F (36.9 C) 98.5 F (36.9 C) 98.4 F (36.9 C)  TempSrc:  Oral Oral Oral  SpO2: 96% 98% 97% 97%  Weight:      Height:        Intake/Output Summary (Last 24 hours) at 10/06/2018 1358 Last data filed at 10/06/2018 0700 Gross per 24 hour  Intake 962.87 ml  Output 500 ml  Net 462.87 ml   Filed Weights   10/05/18 0918  Weight: 43.1 kg    Examination:  General exam: Appears calm and comfortable ,Not in distress,thin built elderly female HEENT:PERRL,Oral mucosa moist, Ear/Nose normal on gross exam Respiratory system: Bilateral equal air entry, normal vesicular breath sounds, no wheezes or crackles  Cardiovascular system: S1 & S2 heard, RRR. No JVD, murmurs, rubs, gallops or clicks. No pedal edema. Gastrointestinal system: Abdomen is nondistended, soft and nontender. No organomegaly or masses felt. Normal bowel sounds heard.G tube Central nervous system: Alert and oriented. No focal neurological deficits. Extremities: No edema, no clubbing ,no cyanosis, distal peripheral pulses palpable. Skin: No rashes, lesions or ulcers,no icterus ,no pallor MSK: Normal muscle bulk,tone ,power Psychiatry: Judgement and insight appear normal. Mood & affect appropriate.     Data Reviewed: I have personally reviewed following labs and imaging studies  CBC:  Recent Labs  Lab 09/30/18 0601 10/05/18 1002 10/06/18 0413  WBC 2.7* 3.0* 2.7*  NEUTROABS  --  1.7  --   HGB 10.8* 12.6 10.2*  HCT 34.7* 40.3 33.4*  MCV 87.6 86.7 88.4   PLT 172 241 201   Basic Metabolic Panel: Recent Labs  Lab 09/30/18 0601 10/01/18 0604 10/05/18 1002 10/06/18 0413  NA 138 143 139 139  K 3.2* 4.0 4.1 3.3*  CL 107 115* 105 108  CO2 22 23 23 27   GLUCOSE 84 84 144* 105*  BUN <5* 9 15 11   CREATININE 0.60 0.86 0.82 0.74  CALCIUM 8.6* 9.0 8.7* 8.1*  MG 1.6* 1.7  --  1.5*   GFR: Estimated Creatinine Clearance: 35 mL/min (by C-G formula based on SCr of 0.74 mg/dL). Liver Function Tests: Recent Labs  Lab 10/05/18 1002 10/06/18 0413  AST 30 20  ALT 23 18  ALKPHOS 60 50  BILITOT 1.0 0.9  PROT 6.6 5.3*  ALBUMIN 3.3* 2.9*   Recent Labs  Lab 10/05/18 1002  LIPASE 43   No results for input(s): AMMONIA in the last 168 hours. Coagulation Profile: No results for input(s): INR, PROTIME in the last 168 hours. Cardiac Enzymes: No results for input(s): CKTOTAL, CKMB, CKMBINDEX, TROPONINI in the last 168 hours. BNP (last 3 results) No results for input(s): PROBNP in the last 8760 hours. HbA1C: No results for input(s): HGBA1C in the last 72 hours. CBG: Recent Labs  Lab 10/05/18 1703 10/05/18 1828 10/05/18 2130 10/06/18 0710 10/06/18 1146  GLUCAP 60* 81 80 106* 114*   Lipid Profile: No results for input(s): CHOL, HDL, LDLCALC, TRIG, CHOLHDL, LDLDIRECT in the last 72 hours. Thyroid Function Tests: No results for input(s): TSH, T4TOTAL, FREET4, T3FREE, THYROIDAB in the last 72 hours. Anemia Panel: No results for input(s): VITAMINB12, FOLATE, FERRITIN, TIBC, IRON, RETICCTPCT in the last 72 hours. Sepsis Labs: No results for input(s): PROCALCITON, LATICACIDVEN in the last 168 hours.  No results found for this or any previous visit (from the past 240 hour(s)).       Radiology Studies: Ct Abdomen Pelvis W Contrast  Result Date: 10/05/2018 CLINICAL DATA:  Recent small bowel obstruction. Recurrent abdominal pain. Vomiting after feeding through PEG tube. EXAM: CT ABDOMEN AND PELVIS WITH CONTRAST TECHNIQUE: Multidetector  CT imaging of the abdomen and pelvis was performed using the standard protocol following bolus administration of intravenous contrast. CONTRAST:  15mL ISOVUE-300 IOPAMIDOL (ISOVUE-300) INJECTION 61%, 85mL ISOVUE-300 IOPAMIDOL (ISOVUE-300) INJECTION 61% COMPARISON:  One view abdomen 09/27/2018. CT of the abdomen and pelvis 09/25/2018. FINDINGS: Lower chest: Lung bases are clear part from mild bibasilar atelectasis. There is no nodule or mass lesion. Heart size is normal. No significant pleural or pericardial effusion. Hepatobiliary: Mild intra and extrahepatic biliary dilation is stable. No obstructing lesion is present. No focal hepatic lesions are present. Gallbladder is mildly distended. Pancreas: Unremarkable. Spleen: Within normal limits. Adrenals/Urinary Tract: Adrenal glands are normal bilaterally. Kidneys and ureters are within normal limits. The urinary bladder is normal. Stomach/Bowel: Stomach is mildly distended. Duodenum is unremarkable. Multiple proximal loops of small bowel are distended. There is a transition point in the left lower quadrant. There is no contrast in the colon scratched at there is minimal contrast into the colon. No free air is present. There is some degree of free fluid. Moderate stool is present in the distal sigmoid and rectum. Vascular/Lymphatic: Minimal atherosclerotic changes are present in the aorta without aneurysm. Reproductive: Status post hysterectomy. No adnexal masses. Other: No  abdominal wall hernia is evident. Musculoskeletal: Levoconvex curvature is present lumbar spine. Asymmetric sclerotic changes are present on the right at L2-3 and L3-4. Sclerotic changes are present on the left at L5-S1. Degenerative changes are present in the SI joints bilaterally, right greater than left. Multilevel facet degenerative changes are noted. The hips are located and within normal limits bilaterally. IMPRESSION: 1. Small bowel obstruction with transition point in the left lower  quadrant. Minimal contrast is seen into the colon. 2. Moderate stool in the distal sigmoid colon and rectum without distal obstruction. 3. A small amount of free fluid is likely reactive. 4. Stable intra and extrahepatic biliary dilation. 5.  Aortic Atherosclerosis (ICD10-I70.0). Electronically Signed   By: Marin Roberts M.D.   On: 10/05/2018 12:53   Dg Abd Portable 1v-small Bowel Obstruction Protocol-initial, 8 Hr Delay  Result Date: 10/06/2018 CLINICAL DATA:  84 year old female with a history of follow-up small-bowel obstruction EXAM: PORTABLE ABDOMEN - 1 VIEW COMPARISON:  Plain film 09/27/2018, CT 10/05/2018 FINDINGS: Enteric contrast has traversed the length of the colon, reaching the distal sigmoid colon and rectum. Gas within stomach and small bowel with no significant distension. Gastric tube projects over the upper abdomen. No displaced fracture. Degenerative changes of the thoracolumbar spine. IMPRESSION: Plain film demonstrates evidence of resolving small bowel obstruction, with no distention of small bowel and contrast within the colon. Electronically Signed   By: Gilmer Mor D.O.   On: 10/06/2018 11:07        Scheduled Meds: . diatrizoate meglumine-sodium  90 mL Per Tube Once  . insulin aspart  0-9 Units Subcutaneous TID WC   Continuous Infusions: . dextrose 5 % and 0.45% NaCl 75 mL/hr at 10/06/18 0600  . magnesium sulfate 1 - 4 g bolus IVPB       LOS: 0 days    Time spent: 25 mins.More than 50% of that time was spent in counseling and/or coordination of care.      Burnadette Pop, MD [facility] Pager [phone]  If 7PM-7AM, please contact night-coverage [email or web address] Password [email or web address] 10/06/2018, 1:58 PM

## 2018-10-07 DIAGNOSIS — E1143 Type 2 diabetes mellitus with diabetic autonomic (poly)neuropathy: Secondary | ICD-10-CM

## 2018-10-07 DIAGNOSIS — K56600 Partial intestinal obstruction, unspecified as to cause: Secondary | ICD-10-CM

## 2018-10-07 DIAGNOSIS — E876 Hypokalemia: Secondary | ICD-10-CM | POA: Diagnosis present

## 2018-10-07 LAB — CBC
HEMATOCRIT: 33.9 % — AB (ref 36.0–46.0)
HEMOGLOBIN: 10.5 g/dL — AB (ref 12.0–15.0)
MCH: 27.6 pg (ref 26.0–34.0)
MCHC: 31 g/dL (ref 30.0–36.0)
MCV: 89.2 fL (ref 80.0–100.0)
NRBC: 0 % (ref 0.0–0.2)
Platelets: 207 10*3/uL (ref 150–400)
RBC: 3.8 MIL/uL — AB (ref 3.87–5.11)
RDW: 17 % — ABNORMAL HIGH (ref 11.5–15.5)
WBC: 2.6 10*3/uL — ABNORMAL LOW (ref 4.0–10.5)

## 2018-10-07 LAB — BASIC METABOLIC PANEL
ANION GAP: 5 (ref 5–15)
BUN: 8 mg/dL (ref 8–23)
CO2: 23 mmol/L (ref 22–32)
Calcium: 8.4 mg/dL — ABNORMAL LOW (ref 8.9–10.3)
Chloride: 109 mmol/L (ref 98–111)
Creatinine, Ser: 0.71 mg/dL (ref 0.44–1.00)
GFR calc Af Amer: >60 mL/min (ref >60)
GFR calc non Af Amer: >60 mL/min (ref >60)
GLUCOSE: 90 mg/dL (ref 70–99)
POTASSIUM: 3.5 mmol/L (ref 3.5–5.1)
Sodium: 137 mmol/L (ref 135–145)

## 2018-10-07 LAB — GLUCOSE, CAPILLARY
GLUCOSE-CAPILLARY: 59 mg/dL — AB (ref 70–99)
GLUCOSE-CAPILLARY: 76 mg/dL (ref 70–99)
GLUCOSE-CAPILLARY: 87 mg/dL (ref 70–99)
Glucose-Capillary: 96 mg/dL (ref 70–99)
Glucose-Capillary: 193 mg/dL — ABNORMAL HIGH (ref 70–99)
Glucose-Capillary: 51 mg/dL — ABNORMAL LOW (ref 70–99)

## 2018-10-07 LAB — MAGNESIUM: Magnesium: 2.2 mg/dL (ref 1.7–2.4)

## 2018-10-07 MED ORDER — BOOST / RESOURCE BREEZE PO LIQD CUSTOM
1.0000 | Freq: Three times a day (TID) | ORAL | Status: DC
  Administered 2018-10-07 – 2018-10-10 (×6): 1 via ORAL

## 2018-10-07 MED ORDER — APIXABAN 2.5 MG PO TABS
2.5000 mg | ORAL_TABLET | Freq: Two times a day (BID) | ORAL | Status: DC
Start: 2018-10-07 — End: 2018-10-09
  Administered 2018-10-07 – 2018-10-09 (×5): 2.5 mg via ORAL
  Filled 2018-10-07 (×5): qty 1

## 2018-10-07 NOTE — Progress Notes (Signed)
Patient had episode of hypoglycemia this evening. Given 4 oz. And rechecked glucose- it was still low. Given 4 more oz. And rechecked. Glucose was 76. Patient was not symptomatic. Will continue to monitor

## 2018-10-07 NOTE — Progress Notes (Signed)
PROGRESS NOTE  LALANA DELLAROCCA ZOX:[id number] DOB: February 05, 1931 DOA: 10/05/2018 PCP: [name], MD  HPI/Brief Narrative  Theresa Howe is a 84 y.o. 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  Patient underwent Gastrografin to G-tube on 10/20 and advised bowel rest.  Subjective Doing well. No nausea. No abdominal pain.   Assessment/Plan:  #Partial SBO with history of low-grade carcinomatosis, improving.  Repeat abdominal x-ray on 10/20 show increased bowel distention however patient has normal abdominal exam once a day.  Surgery agrees and will advance diet to clears and monitor with G-tube clamped.  Anticipate potential discharge next 24 hours if continues to do well as  she is currently having no nausea or vomiting.  #Peritoneal carcinomatosis, chronic.  Secondary to neuroendocrine tumor resected in January 2018.  Outpatient oncology follow-up scheduled  #Failure to thrive status post G-tube.  Currently clamped while monitoring on diet per surgical recommendations  #History of DVT.  On Eliquis at home will resume as patient is able to start diet today.  #Hypokalemia, hypomagnesemia, resolved.  Treated with supplementation  #Type 2 diabetes, chronic.  A1c in September 2018 6.2%.  Monitor with sliding scale for now.  Holding home Levemir.    Code Status: FULL    Family Communication: no family at bedside   Disposition Plan:  Advanced diet to clear, monitor to ensure does well    Consultants:  Surgery     Procedures:  none   Antimicrobials: Anti-infectives (From admission,  onward)   None         Cultures:  none  Telemetry:no  DVT prophylaxis:  resume home eliquis   Objective: Vitals:   10/06/18 0552 10/06/18 1401 10/06/18 2120 10/07/18 0520  BP: (!) 104/57 (!) 103/55 107/60 (!) 116/55  Pulse: (!) 59 67 67 (!) 55  Resp: 20 18 16    Temp: 98.4 F (36.9 C) 97.8 F (36.6 C) 98.5 F (36.9 C) 97.6 F (36.4 C)  TempSrc: Oral Oral Oral Oral  SpO2: 97% 99% 98% 100%  Weight:      Height:        Intake/Output Summary (Last 24 hours) at 10/07/2018 1058 Last data filed at 10/07/2018 1014 Gross per 24 hour  Intake 1108.03 ml  Output 775 ml  Net 333.03 ml   Filed Weights   10/05/18 0918  Weight: 43.1 kg    Exam:  Constitutional:thin elderly female, no distress Eyes: EOMI, anicteric, normal conjunctivae ENMT: Oropharynx with moist mucous membranes Cardiovascular: RRR no MRGs, with no peripheral edema Respiratory: Normal respiratory effort on room air, clear breath sounds  Abdomen: Soft,non-tender, non-distended. G tube in place with no surrounding erythema or tenderness Skin: No rash ulcers, or lesions. Without skin tenting  Neurologic: Grossly no focal neuro deficit. Psychiatric:Appropriate affect, and mood. Mental status AAOx3  Data Reviewed: CBC: Recent Labs  Lab 10/05/18 1002 10/06/18 0413 10/07/18 0434  WBC 3.0* 2.7* 2.6*  NEUTROABS 1.7  --   --   HGB 12.6 10.2* 10.5*  HCT 40.3 33.4* 33.9*  MCV 86.7 88.4 89.2  PLT 241 201 207   Basic Metabolic Panel: Recent Labs  Lab 10/01/18 0604  10/05/18 1002 10/06/18 0413 10/07/18 0434  NA 143 139 139 137  K 4.0 4.1 3.3* 3.5  CL 115* 105 108 109  CO2 23 23 27 23   GLUCOSE 84 144* 105* 90  BUN 9 15 11 8   CREATININE 0.86 0.82 0.74 0.71  CALCIUM 9.0 8.7* 8.1* 8.4*  MG 1.7  --  1.5* 2.2   GFR: Estimated Creatinine Clearance: 35 mL/min (by C-G formula based on SCr of 0.71 mg/dL). Liver Function Tests: Recent Labs  Lab 10/05/18 1002 10/06/18 0413  AST 30 20  ALT 23 18    ALKPHOS 60 50  BILITOT 1.0 0.9  PROT 6.6 5.3*  ALBUMIN 3.3* 2.9*   Recent Labs  Lab 10/05/18 1002  LIPASE 43   No results for input(s): AMMONIA in the last 168 hours. Coagulation Profile: No results for input(s): INR, PROTIME in the last 168 hours. Cardiac Enzymes: No results for input(s): CKTOTAL, CKMB, CKMBINDEX, TROPONINI in the last 168 hours. BNP (last 3 results) No results for input(s): PROBNP in the last 8760 hours. HbA1C: No results for input(s): HGBA1C in the last 72 hours. CBG: Recent Labs  Lab 10/06/18 0710 10/06/18 1146 10/06/18 1801 10/06/18 2253 10/07/18 0737  GLUCAP 106* 114* 83 81 96   Lipid Profile: No results for input(s): CHOL, HDL, LDLCALC, TRIG, CHOLHDL, LDLDIRECT in the last 72 hours. Thyroid Function Tests: No results for input(s): TSH, T4TOTAL, FREET4, T3FREE, THYROIDAB in the last 72 hours. Anemia Panel: No results for input(s): VITAMINB12, FOLATE, FERRITIN, TIBC, IRON, RETICCTPCT in the last 72 hours. Urine analysis:    Component Value Date/Time   COLORURINE YELLOW 10/05/2018 1330   APPEARANCEUR CLOUDY (A) 10/05/2018 1330   LABSPEC <1.005 (L) 10/05/2018 1330   PHURINE 7.0 10/05/2018 1330   GLUCOSEU NEGATIVE 10/05/2018 1330   HGBUR SMALL (A) 10/05/2018 1330   BILIRUBINUR NEGATIVE 10/05/2018 1330   KETONESUR NEGATIVE 10/05/2018 1330   PROTEINUR 30 (A) 10/05/2018 1330   NITRITE NEGATIVE 10/05/2018 1330   LEUKOCYTESUR LARGE (A) 10/05/2018 1330   Sepsis Labs: @LABRCNTIP (procalcitonin:4,lacticidven:4)  )No results found for this or any previous visit (from the past 240 hour(s)).    Studies: Dg Abd Portable 1v-small Bowel Obstruction Protocol-24 Hr Delay  Result Date: 10/06/2018 CLINICAL DATA:  Small bowel follow-through, 8 hour delay. Small-bowel obstruction EXAM: PORTABLE ABDOMEN - 1 VIEW COMPARISON:  10/06/2018 FINDINGS: Gastrostomy tube projects over the upper abdomen. Dilated small bowel loops again noted within the abdomen and  pelvis. Oral contrast material is seen within the small bowel loops and as well as decompressed large bowel. Overall, bowel-gas pattern appears to have worsened since prior KUB concerning for worsening small bowel obstruction. IMPRESSION: Increasing small bowel distention since prior KUB concerning for worsening small bowel obstruction. This appears to be partial as contrast and gas are noted within the large bowel. Electronically Signed   By: Charlett Nose M.D.   On: 10/06/2018 19:36    Scheduled Meds: . enoxaparin (LOVENOX) injection  30 mg Subcutaneous Q24H  . insulin aspart  0-9 Units Subcutaneous TID WC    Continuous Infusions: . dextrose 5 % and 0.45% NaCl 75 mL/hr at 10/06/18 0600     LOS: 1 day     Laverna Peace, MD [facility] Pager [phone]  If 7PM-7AM, please contact night-coverage [email or web address] Password [email or web address] 10/07/2018, 10:58 AM

## 2018-10-07 NOTE — Progress Notes (Signed)
[facility] Progress Note     Subjective: CC-  Patient asking when she can go home. States that she no longer has any abdominal pain. No n/v over night. G tube is clamped. States that she had a BM last night and is passing some flatus.  Objective: Vital signs in last 24 hours: Temp:  [97.6 F (36.4 C)-98.5 F (36.9 C)] 97.6 F (36.4 C) (10/21 0520) Pulse Rate:  [55-67] 55 (10/21 0520) Resp:  [16-18] 16 (10/20 2120) BP: (103-116)/(55-60) 116/55 (10/21 0520) SpO2:  [98 %-100 %] 100 % (10/21 0520) Last BM Date: 09/30/18  Intake/Output from previous day: 10/20 0701 - 10/21 0700 In: 868 [I.V.:868] Out: 575 [Urine:575] Intake/Output this shift: No intake/output data recorded.  PE: Gen:  Alert, NAD, pleasant HEENT: EOM's intact, pupils equal and round Card:  RRR Pulm:  CTAB, no W/R/R, effort normal Abd: Soft, ND, NT, +BS, G-tube site cdi Ext:  Muscle wasting noted to BUE/BLE Psych: Alert and oriented to self and location Skin: no rashes noted, warm and dry  Lab Results:  Recent Labs    10/06/18 0413 10/07/18 0434  WBC 2.7* 2.6*  HGB 10.2* 10.5*  HCT 33.4* 33.9*  PLT 201 207   BMET Recent Labs    10/06/18 0413 10/07/18 0434  NA 139 137  K 3.3* 3.5  CL 108 109  CO2 27 23  GLUCOSE 105* 90  BUN 11 8  CREATININE 0.74 0.71  CALCIUM 8.1* 8.4*   PT/INR No results for input(s): LABPROT, INR in the last 72 hours. CMP     Component Value Date/Time   NA 137 10/07/2018 0434   K 3.5 10/07/2018 0434   CL 109 10/07/2018 0434   CO2 23 10/07/2018 0434   GLUCOSE 90 10/07/2018 0434   BUN 8 10/07/2018 0434   CREATININE 0.71 10/07/2018 0434   CREATININE 0.88 09/03/2018 1519   CALCIUM 8.4 (L) 10/07/2018 0434   PROT 5.3 (L) 10/06/2018 0413   ALBUMIN 2.9 (L) 10/06/2018 0413   AST 20 10/06/2018 0413   ALT 18 10/06/2018 0413   ALKPHOS 50 10/06/2018 0413   BILITOT 0.9 10/06/2018 0413   GFRNONAA >60 10/07/2018 0434   GFRNONAA 60 09/03/2018 1519   GFRAA  >60 10/07/2018 0434   GFRAA 69 09/03/2018 1519   Lipase     Component Value Date/Time   LIPASE 43 10/05/2018 1002       Studies/Results: Ct Abdomen Pelvis W Contrast  Result Date: 10/05/2018 CLINICAL DATA:  Recent small bowel obstruction. Recurrent abdominal pain. Vomiting after feeding through PEG tube. EXAM: CT ABDOMEN AND PELVIS WITH CONTRAST TECHNIQUE: Multidetector CT imaging of the abdomen and pelvis was performed using the standard protocol following bolus administration of intravenous contrast. CONTRAST:  29mL ISOVUE-300 IOPAMIDOL (ISOVUE-300) INJECTION 61%, 51mL ISOVUE-300 IOPAMIDOL (ISOVUE-300) INJECTION 61% COMPARISON:  One view abdomen 09/27/2018. CT of the abdomen and pelvis 09/25/2018. FINDINGS: Lower chest: Lung bases are clear part from mild bibasilar atelectasis. There is no nodule or mass lesion. Heart size is normal. No significant pleural or pericardial effusion. Hepatobiliary: Mild intra and extrahepatic biliary dilation is stable. No obstructing lesion is present. No focal hepatic lesions are present. Gallbladder is mildly distended. Pancreas: Unremarkable. Spleen: Within normal limits. Adrenals/Urinary Tract: Adrenal glands are normal bilaterally. Kidneys and ureters are within normal limits. The urinary bladder is normal. Stomach/Bowel: Stomach is mildly distended. Duodenum is unremarkable. Multiple proximal loops of small bowel are distended. There is a transition point in the left lower quadrant.  There is no contrast in the colon scratched at there is minimal contrast into the colon. No free air is present. There is some degree of free fluid. Moderate stool is present in the distal sigmoid and rectum. Vascular/Lymphatic: Minimal atherosclerotic changes are present in the aorta without aneurysm. Reproductive: Status post hysterectomy. No adnexal masses. Other: No abdominal wall hernia is evident. Musculoskeletal: Levoconvex curvature is present lumbar spine. Asymmetric  sclerotic changes are present on the right at L2-3 and L3-4. Sclerotic changes are present on the left at L5-S1. Degenerative changes are present in the SI joints bilaterally, right greater than left. Multilevel facet degenerative changes are noted. The hips are located and within normal limits bilaterally. IMPRESSION: 1. Small bowel obstruction with transition point in the left lower quadrant. Minimal contrast is seen into the colon. 2. Moderate stool in the distal sigmoid colon and rectum without distal obstruction. 3. A small amount of free fluid is likely reactive. 4. Stable intra and extrahepatic biliary dilation. 5.  Aortic Atherosclerosis (ICD10-I70.0). Electronically Signed   By: San Morelle M.D.   On: 10/05/2018 12:53   Dg Abd Portable 1v-small Bowel Obstruction Protocol-24 Hr Delay  Result Date: 10/06/2018 CLINICAL DATA:  Small bowel follow-through, 8 hour delay. Small-bowel obstruction EXAM: PORTABLE ABDOMEN - 1 VIEW COMPARISON:  10/06/2018 FINDINGS: Gastrostomy tube projects over the upper abdomen. Dilated small bowel loops again noted within the abdomen and pelvis. Oral contrast material is seen within the small bowel loops and as well as decompressed large bowel. Overall, bowel-gas pattern appears to have worsened since prior KUB concerning for worsening small bowel obstruction. IMPRESSION: Increasing small bowel distention since prior KUB concerning for worsening small bowel obstruction. This appears to be partial as contrast and gas are noted within the large bowel. Electronically Signed   By: Rolm Baptise M.D.   On: 10/06/2018 19:36   Dg Abd Portable 1v-small Bowel Obstruction Protocol-initial, 8 Hr Delay  Result Date: 10/06/2018 CLINICAL DATA:  84 year old female with a history of follow-up small-bowel obstruction EXAM: PORTABLE ABDOMEN - 1 VIEW COMPARISON:  Plain film 09/27/2018, CT 10/05/2018 FINDINGS: Enteric contrast has traversed the length of the colon, reaching the distal  sigmoid colon and rectum. Gas within stomach and small bowel with no significant distension. Gastric tube projects over the upper abdomen. No displaced fracture. Degenerative changes of the thoracolumbar spine. IMPRESSION: Plain film demonstrates evidence of resolving small bowel obstruction, with no distention of small bowel and contrast within the colon. Electronically Signed   By: Corrie Mckusick D.O.   On: 10/06/2018 11:07    Anti-infectives: Anti-infectives (From admission, onward)   None       Assessment/Plan DM HTN HLD H/o DVT - hold eliquis  SBO, h/o neuroendocrine tumor with carcinomatosis - s/p 2 ex laps for bowel resections by Dr. Barry Dienes for the above - recent admission 10/9>>10/15 for SBO that resolved with medical management - readmitted 10/19 - Abdominal pain now resolved, patient had a BM and is passing flatus. Keep G-tube clamped and advance to clear liquids. Mobilize.  ID - none FEN - IVF, G-tube clamped, CLD VTE - SCDs, lovenox Foley - none   LOS: 1 day    Theresa Howe , [facility] 10/07/2018, 8:54 AM Pager: [phone] Mon 7:00 am -11:30 AM Tues-Fri 7:00 am-4:30 pm Sat-Sun 7:00 am-11:30 am

## 2018-10-07 NOTE — Progress Notes (Signed)
Initial Nutrition Assessment  DOCUMENTATION CODES:   Underweight, Severe malnutrition in context of chronic illness  INTERVENTION:   -Boost Breeze po TID, each supplement provides 250 kcal and 9 grams of protein  Monitor magnesium, potassium, and phosphorus daily for at least 3 days, MD to replete as needed, as pt is at risk for refeeding syndrome.   If pt unable to meet needs PO recommend initiation of Vital 1.5 @ 20 ml/hr increase by 10 ml Q8 hours to goal rate of 45 ml/hr. This provides 1620 kcal, 73 grams protein, and 821 ml free water. Meets 100% of needs.   NUTRITION DIAGNOSIS:   Severe Malnutrition related to chronic illness, cancer and cancer related treatments, dysphagia as evidenced by percent weight loss, energy intake < or equal to 75% for > or equal to 1 month, severe fat depletion, severe muscle depletion.  GOAL:   Patient will meet greater than or equal to 90% of their needs  MONITOR:   Diet advancement, Labs, Weight trends, TF tolerance, I & O's, Supplement acceptance, PO intake  REASON FOR ASSESSMENT:   Malnutrition Screening Tool    ASSESSMENT:   Patient with PMH significant for DM, FTT, HLD, HTN, and neuroendocrine tumor with carcinomatosis s/p 2 ex laps for bowel resection by Dr. Barry Dienes. Pt was recent discharged four days ago with SBO which improved conservatively. Presents this admission with complaints of nausea/vomiting. Admitted for SBO likely due to adhesions.    10/19- G-tube placed to suction 10/20- G-tube clamped, advanced to clear liquids  Pt does not take much by mouth and endorses giving herself 3 feedings of Vital 1.5 daily (provides 1065 kcal, 48 grams protein, and 543 ml free water). She was able to administer feedings up until 3 days ago when she began to experience nausea with resulting vomiting. Pt was previously seen on 10/10 by clinical nutrition and it was recommended pt be started on continuous Vital AF 1.2 @ 55 ml/hr as pt was at risk  for refeeding, but this was never started. Pt is currently on a clear liquid diet with breakfast 100% completion charted. Pt states she consumed broth, jello, and juice without complication. RD to provide pt with supplements to maximize calories and protein.   Would consider restarting tube feeding once diet is advanced as pt is severely malnousihed. It does not seem pt will be able to meet her needs by PO intake alone. Would like to discuss with family pt's actual home TF recommendation as three bolus feedings are not meeting her needs (no family present today).   Pt endorses a UBW of 120 lb and is unsure of any recent wt loss. Records indicate pt weighed 119 lb on 4/25 and 95 lb this admission (20.1% wt loss in 6 months, significant for time frame). Nutrition-Focused physical exam completed.   Medications reviewed and include: D5 @ 75 ml/hr Labs reviewed.   NUTRITION - FOCUSED PHYSICAL EXAM:    Most Recent Value  Orbital Region  Moderate depletion  Upper Arm Region  Severe depletion  Thoracic and Lumbar Region  Severe depletion  Buccal Region  Moderate depletion  Temple Region  Severe depletion  Clavicle Bone Region  Severe depletion  Clavicle and Acromion Bone Region  Severe depletion  Scapular Bone Region  Severe depletion  Dorsal Hand  Severe depletion  Patellar Region  Severe depletion  Anterior Thigh Region  Severe depletion  Posterior Calf Region  Severe depletion  Edema (RD Assessment)  None     Diet  Order:   Diet Order            Diet clear liquid Room service appropriate? Yes; Fluid consistency: Thin  Diet effective now              EDUCATION NEEDS:   Education needs have been addressed  Skin:  Skin Assessment: Reviewed RN Assessment  Last BM:  10/06/18  Height:   Ht Readings from Last 1 Encounters:  10/05/18 5\' 6"  (1.676 m)    Weight:   Wt Readings from Last 1 Encounters:  10/05/18 43.1 kg    Ideal Body Weight:  59.1 kg  BMI:  Body mass index is  15.33 kg/m.  Estimated Nutritional Needs:   Kcal:  1500-1700 kcal  Protein:  70-85 grams  Fluid:  >/= 1.5 L/day   Mariana Single RD, LDN Clinical Nutrition Pager # - [phone]

## 2018-10-08 ENCOUNTER — Telehealth: Payer: Self-pay | Admitting: *Deleted

## 2018-10-08 LAB — BASIC METABOLIC PANEL
Anion gap: 6 (ref 5–15)
BUN: 5 mg/dL — ABNORMAL LOW (ref 8–23)
CHLORIDE: 108 mmol/L (ref 98–111)
CO2: 25 mmol/L (ref 22–32)
CREATININE: 0.83 mg/dL (ref 0.44–1.00)
Calcium: 8.7 mg/dL — ABNORMAL LOW (ref 8.9–10.3)
GFR calc non Af Amer: >60 mL/min (ref >60)
Glucose, Bld: 101 mg/dL — ABNORMAL HIGH (ref 70–99)
Potassium: 4.1 mmol/L (ref 3.5–5.1)
Sodium: 139 mmol/L (ref 135–145)

## 2018-10-08 LAB — CBC
HCT: 39.1 % (ref 36.0–46.0)
Hemoglobin: 12 g/dL (ref 12.0–15.0)
MCH: 27.3 pg (ref 26.0–34.0)
MCHC: 30.7 g/dL (ref 30.0–36.0)
MCV: 88.9 fL (ref 80.0–100.0)
PLATELETS: 230 10*3/uL (ref 150–400)
RBC: 4.4 MIL/uL (ref 3.87–5.11)
RDW: 16.6 % — AB (ref 11.5–15.5)
WBC: 3.7 10*3/uL — AB (ref 4.0–10.5)
nRBC: 0 % (ref 0.0–0.2)

## 2018-10-08 LAB — MAGNESIUM
MAGNESIUM: 1.5 mg/dL — AB (ref 1.7–2.4)
Magnesium: 1.6 mg/dL — ABNORMAL LOW (ref 1.7–2.4)

## 2018-10-08 LAB — PHOSPHORUS
PHOSPHORUS: 3 mg/dL (ref 2.5–4.6)
PHOSPHORUS: 2.7 mg/dL (ref 2.5–4.6)

## 2018-10-08 LAB — GLUCOSE, CAPILLARY
GLUCOSE-CAPILLARY: 105 mg/dL — AB (ref 70–99)
GLUCOSE-CAPILLARY: 74 mg/dL (ref 70–99)
Glucose-Capillary: 91 mg/dL (ref 70–99)
Glucose-Capillary: 159 mg/dL — ABNORMAL HIGH (ref 70–99)
Glucose-Capillary: 63 mg/dL — ABNORMAL LOW (ref 70–99)
Glucose-Capillary: 115 mg/dL — ABNORMAL HIGH (ref 70–99)

## 2018-10-08 MED ORDER — VITAL 1.5 CAL PO LIQD
1000.0000 mL | ORAL | Status: DC
  Administered 2018-10-08: 1000 mL
  Filled 2018-10-08 (×2): qty 1000

## 2018-10-08 MED ORDER — MAGNESIUM SULFATE 2 GM/50ML IV SOLN
2.0000 g | Freq: Once | INTRAVENOUS | Status: AC
  Administered 2018-10-08: 2 g via INTRAVENOUS
  Filled 2018-10-08: qty 50

## 2018-10-08 MED ORDER — DOCUSATE SODIUM 100 MG PO CAPS
100.0000 mg | ORAL_CAPSULE | Freq: Two times a day (BID) | ORAL | Status: DC
  Administered 2018-10-08 – 2018-10-11 (×4): 100 mg via ORAL
  Filled 2018-10-08 (×7): qty 1

## 2018-10-08 MED ORDER — VITAL AF 1.2 CAL PO LIQD
1000.0000 mL | ORAL | Status: DC
  Filled 2018-10-08: qty 1000

## 2018-10-08 NOTE — Progress Notes (Signed)
Patient with CBG of 68, start Hypoglycemia protocol, patient can have clears and was given BOOST BREEZE to drink. We will continue to monitor.

## 2018-10-08 NOTE — Progress Notes (Signed)
CBG 74 after a container of BOOST BREEZE drink. We will continue to monitor the patient.

## 2018-10-08 NOTE — Progress Notes (Signed)
[facility] Progress Note     Subjective: CC-  Patient reports mild intermittent abdominal pain, but no nausea or vomiting. Denies bloating. She had 2 BMs yesterday. Unsure if she's passed any flatus today. Tolerating clear liquids. Likes [employer].  Objective: Vital signs in last 24 hours: Temp:  [97.7 F (36.5 C)-98.8 F (37.1 C)] 97.7 F (36.5 C) (10/22 5170) Pulse Rate:  [61-85] 85 (10/22 0608) Resp:  [17-18] 18 (10/22 0174) BP: (105-123)/(65-70) 123/70 (10/22 0608) SpO2:  [97 %-100 %] 97 % (10/22 0608) Last BM Date: 09/30/18  Intake/Output from previous day: 10/21 0701 - 10/22 0700 In: 1940 [P.O.:1940] Out: 1850 [Urine:1850] Intake/Output this shift: No intake/output data recorded.  PE: Gen:  Alert, NAD, pleasant HEENT: EOM's intact, pupils equal and round Card:  RRR Pulm:  CTAB, no W/R/R, effort normal Abd: Soft, ND, NT, +BS, G-tube site cdi Ext:  Muscle wasting noted to BUE/BLE Psych: Alert and oriented to self and location Skin: no rashes noted, warm and dry  Lab Results:  Recent Labs    10/06/18 0413 10/07/18 0434  WBC 2.7* 2.6*  HGB 10.2* 10.5*  HCT 33.4* 33.9*  PLT 201 207   BMET Recent Labs    10/06/18 0413 10/07/18 0434  NA 139 137  K 3.3* 3.5  CL 108 109  CO2 27 23  GLUCOSE 105* 90  BUN 11 8  CREATININE 0.74 0.71  CALCIUM 8.1* 8.4*   PT/INR No results for input(s): LABPROT, INR in the last 72 hours. CMP     Component Value Date/Time   NA 137 10/07/2018 0434   K 3.5 10/07/2018 0434   CL 109 10/07/2018 0434   CO2 23 10/07/2018 0434   GLUCOSE 90 10/07/2018 0434   BUN 8 10/07/2018 0434   CREATININE 0.71 10/07/2018 0434   CREATININE 0.88 09/03/2018 1519   CALCIUM 8.4 (L) 10/07/2018 0434   PROT 5.3 (L) 10/06/2018 0413   ALBUMIN 2.9 (L) 10/06/2018 0413   AST 20 10/06/2018 0413   ALT 18 10/06/2018 0413   ALKPHOS 50 10/06/2018 0413   BILITOT 0.9 10/06/2018 0413   GFRNONAA >60 10/07/2018 0434   GFRNONAA 60  09/03/2018 1519   GFRAA >60 10/07/2018 0434   GFRAA 69 09/03/2018 1519   Lipase     Component Value Date/Time   LIPASE 43 10/05/2018 1002       Studies/Results: Dg Abd Portable 1v-small Bowel Obstruction Protocol-24 Hr Delay  Result Date: 10/06/2018 CLINICAL DATA:  Small bowel follow-through, 8 hour delay. Small-bowel obstruction EXAM: PORTABLE ABDOMEN - 1 VIEW COMPARISON:  10/06/2018 FINDINGS: Gastrostomy tube projects over the upper abdomen. Dilated small bowel loops again noted within the abdomen and pelvis. Oral contrast material is seen within the small bowel loops and as well as decompressed large bowel. Overall, bowel-gas pattern appears to have worsened since prior KUB concerning for worsening small bowel obstruction. IMPRESSION: Increasing small bowel distention since prior KUB concerning for worsening small bowel obstruction. This appears to be partial as contrast and gas are noted within the large bowel. Electronically Signed   By: Rolm Baptise M.D.   On: 10/06/2018 19:36   Dg Abd Portable 1v-small Bowel Obstruction Protocol-initial, 8 Hr Delay  Result Date: 10/06/2018 CLINICAL DATA:  84 year old female with a history of follow-up small-bowel obstruction EXAM: PORTABLE ABDOMEN - 1 VIEW COMPARISON:  Plain film 09/27/2018, CT 10/05/2018 FINDINGS: Enteric contrast has traversed the length of the colon, reaching the distal sigmoid colon and rectum. Gas within stomach and  small bowel with no significant distension. Gastric tube projects over the upper abdomen. No displaced fracture. Degenerative changes of the thoracolumbar spine. IMPRESSION: Plain film demonstrates evidence of resolving small bowel obstruction, with no distention of small bowel and contrast within the colon. Electronically Signed   By: Corrie Mckusick D.O.   On: 10/06/2018 11:07    Anti-infectives: Anti-infectives (From admission, onward)   None       Assessment/Plan DM HTN HLD H/o DVT - eliquis restarted  10/21, would recommend holding this until we know that patient is going to tolerate diet/TF  SBO, h/o neuroendocrine tumor with carcinomatosis - s/p 2 ex laps for bowel resections by Dr. Barry Dienes for the above - recent admission 10/9>>10/15 for SBO that resolved with medical management - readmitted 10/19 - Tolerating clear liquids and having bowel function. will restart tube feedings (Vital AF 1.2 at 77mL/hr) and continue clears. Encourage OOB/mobilization.  ID - none FEN - IVF, G-tube clamped, CLD, Boost TID, TF @20mL /hr VTE - SCDs, eliquis Foley - none   LOS: 2 days    Wellington Hampshire , [facility] 10/08/2018, 8:27 AM Pager: [phone] Mon 7:00 am -11:30 AM Tues-Fri 7:00 am-4:30 pm Sat-Sun 7:00 am-11:30 am

## 2018-10-08 NOTE — Telephone Encounter (Signed)
Received call from [place], [facility] PT with [place] [phone]~ telephone.   Requested VO to extend [facility] PT services 2x weekly x2 weeks, then 1x weekly x1 week for strengthening. VO given.   Upon review of chart, noted that patient has been hospitalized since 10/05/2018.  Provider to be made aware.

## 2018-10-08 NOTE — Progress Notes (Signed)
PROGRESS NOTE  DORINNE WESBY DGL:[id number] DOB: 05/29/1931 DOA: 10/05/2018 PCP: [name], MD  HPI/Brief Narrative  Theresa Howe is a 84 y.o. 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  Patient underwent Gastrografin to G-tube on 10/20 and advised bowel rest.  Subjective Did well on diet yesterday. No abdominal pain. No chest pain. No blood in stool  Assessment/Plan:  #Partial SBO with history of low-grade carcinomatosis, improving.  Repeat abdominal x-ray on 10/20 show increased bowel distention however patient has normal abdominal exam and tolerated clears.  Surgery agrees and will restart tube feeds and monitor.  Encourage out of bed Anticipate potential discharge next 24 hours if continues to do well as  she is currently having no nausea or vomiting.  #Peritoneal carcinomatosis, chronic.  Secondary to neuroendocrine tumor resected in January 2018.  Outpatient oncology follow-up scheduled  #Failure to thrive status post G-tube. Will restart tube feeds today  #History of DVT.  Resumed Eliquis and doing well.   #Hypokalemia, hypomagnesemia, resolved.  Treated with supplementation  #Type 2 diabetes, chronic.  A1c in September 2018 6.2%.  Monitor with sliding scale with fasting in low 100s.  Continue to hold home Levemir.    Code Status: FULL    Family Communication: no family at bedside   Disposition Plan:  Clear liquids, resume tube feeds and monitor to ensure does well    Consultants:  Surgery     Procedures:  none   Antimicrobials: Anti-infectives (From admission,  onward)   None        Cultures:  none  Telemetry:no  DVT prophylaxis:  resume home eliquis   Objective: Vitals:   10/07/18 0520 10/07/18 1319 10/07/18 2220 10/08/18 0608  BP: (!) 116/55 105/66 108/65 123/70  Pulse: (!) 55 61 61 85  Resp:  17 18 18   Temp: 97.6 F (36.4 C) 98.8 F (37.1 C) 98.7 F (37.1 C) 97.7 F (36.5 C)  TempSrc: Oral Oral Oral Oral  SpO2: 100% 97% 100% 97%  Weight:      Height:        Intake/Output Summary (Last 24 hours) at 10/08/2018 0920 Last data filed at 10/08/2018 5188 Gross per 24 hour  Intake 1940 ml  Output 1850 ml  Net 90 ml   Filed Weights   10/05/18 0918  Weight: 43.1 kg    Exam:  Constitutional:thin elderly female, no distress Eyes: EOMI, anicteric, normal conjunctivae Cardiovascular: RRR no MRGs, with no peripheral edema Respiratory: Normal respiratory effort on room air, clear breath sounds  Abdomen: Soft,non-tender, non-distended. G tube in place with no surrounding erythema or tenderness Skin: No rash ulcers, or lesions. Without skin tenting  Neurologic: Grossly no focal neuro deficit. Psychiatric:Appropriate affect, and mood. Mental status alert to person. Not to place (keeps asking is all of this [facility] long), time, or context  Data Reviewed: CBC: Recent Labs  Lab 10/05/18 1002 10/06/18 0413 10/07/18 0434  WBC 3.0* 2.7* 2.6*  NEUTROABS 1.7  --   --   HGB 12.6 10.2* 10.5*  HCT 40.3 33.4* 33.9*  MCV 86.7 88.4 89.2  PLT 241 201 207   Basic Metabolic  Panel: Recent Labs  Lab 10/05/18 1002 10/06/18 0413 10/07/18 0434  NA 139 139 137  K 4.1 3.3* 3.5  CL 105 108 109  CO2 23 27 23   GLUCOSE 144* 105* 90  BUN 15 11 8   CREATININE 0.82 0.74 0.71  CALCIUM 8.7* 8.1* 8.4*  MG  --  1.5* 2.2   GFR: Estimated Creatinine Clearance: 35 mL/min (by C-G formula based on SCr of 0.71 mg/dL). Liver Function Tests: Recent Labs  Lab 10/05/18 1002 10/06/18 0413  AST 30 20  ALT 23 18  ALKPHOS 60 50  BILITOT 1.0  0.9  PROT 6.6 5.3*  ALBUMIN 3.3* 2.9*   Recent Labs  Lab 10/05/18 1002  LIPASE 43   No results for input(s): AMMONIA in the last 168 hours. Coagulation Profile: No results for input(s): INR, PROTIME in the last 168 hours. Cardiac Enzymes: No results for input(s): CKTOTAL, CKMB, CKMBINDEX, TROPONINI in the last 168 hours. BNP (last 3 results) No results for input(s): PROBNP in the last 8760 hours. HbA1C: No results for input(s): HGBA1C in the last 72 hours. CBG: Recent Labs  Lab 10/07/18 1716 10/07/18 1753 10/07/18 1818 10/07/18 1959 10/08/18 0743  GLUCAP 51* 59* 76 87 105*   Lipid Profile: No results for input(s): CHOL, HDL, LDLCALC, TRIG, CHOLHDL, LDLDIRECT in the last 72 hours. Thyroid Function Tests: No results for input(s): TSH, T4TOTAL, FREET4, T3FREE, THYROIDAB in the last 72 hours. Anemia Panel: No results for input(s): VITAMINB12, FOLATE, FERRITIN, TIBC, IRON, RETICCTPCT in the last 72 hours. Urine analysis:    Component Value Date/Time   COLORURINE YELLOW 10/05/2018 1330   APPEARANCEUR CLOUDY (A) 10/05/2018 1330   LABSPEC <1.005 (L) 10/05/2018 1330   PHURINE 7.0 10/05/2018 1330   GLUCOSEU NEGATIVE 10/05/2018 1330   HGBUR SMALL (A) 10/05/2018 1330   BILIRUBINUR NEGATIVE 10/05/2018 1330   KETONESUR NEGATIVE 10/05/2018 1330   PROTEINUR 30 (A) 10/05/2018 1330   NITRITE NEGATIVE 10/05/2018 1330   LEUKOCYTESUR LARGE (A) 10/05/2018 1330   Sepsis Labs: @LABRCNTIP (procalcitonin:4,lacticidven:4)  ) Recent Results (from the past 240 hour(s))  Culture, Urine     Status: None (Preliminary result)   Collection Time: 10/07/18 12:12 AM  Result Value Ref Range Status   Specimen Description   Final    URINE, RANDOM Performed at [facility], [address] [place], [place] [address]    Special Requests   Final    NONE Performed at [facility], [address], [place], [place] [address]    Culture   Final    CULTURE  REINCUBATED FOR BETTER GROWTH Performed at [facility], [address], [place], [place] [address]    Report Status PENDING  Incomplete      Studies: No results found.  Scheduled Meds: . apixaban  2.5 mg Oral BID  . docusate sodium  100 mg Oral BID  . feeding supplement  1 Container Oral TID BM  . feeding supplement (VITAL AF 1.2 CAL)  1,000 mL Per Tube Q24H  . insulin aspart  0-9 Units Subcutaneous TID WC    Continuous Infusions:    LOS: 2 days     Laverna Peace, MD [facility] Pager [phone]  If 7PM-7AM, please contact night-coverage [email or web address] Password [email or web address] 10/08/2018, 9:20 AM

## 2018-10-09 LAB — HEPARIN LEVEL (UNFRACTIONATED): Heparin Unfractionated: 1.98 IU/mL — ABNORMAL HIGH (ref 0.30–0.70)

## 2018-10-09 LAB — BASIC METABOLIC PANEL
ANION GAP: 8 (ref 5–15)
BUN: 7 mg/dL — ABNORMAL LOW (ref 8–23)
CALCIUM: 8.5 mg/dL — AB (ref 8.9–10.3)
CHLORIDE: 107 mmol/L (ref 98–111)
CO2: 24 mmol/L (ref 22–32)
Creatinine, Ser: 0.63 mg/dL (ref 0.44–1.00)
GFR calc non Af Amer: >60 mL/min (ref >60)
Glucose, Bld: 112 mg/dL — ABNORMAL HIGH (ref 70–99)
Potassium: 3.4 mmol/L — ABNORMAL LOW (ref 3.5–5.1)
SODIUM: 139 mmol/L (ref 135–145)

## 2018-10-09 LAB — MAGNESIUM
Magnesium: 1.8 mg/dL (ref 1.7–2.4)
Magnesium: 1.6 mg/dL — ABNORMAL LOW (ref 1.7–2.4)

## 2018-10-09 LAB — GLUCOSE, CAPILLARY
GLUCOSE-CAPILLARY: 119 mg/dL — AB (ref 70–99)
GLUCOSE-CAPILLARY: 128 mg/dL — AB (ref 70–99)
GLUCOSE-CAPILLARY: 93 mg/dL (ref 70–99)
Glucose-Capillary: 107 mg/dL — ABNORMAL HIGH (ref 70–99)
Glucose-Capillary: 104 mg/dL — ABNORMAL HIGH (ref 70–99)
Glucose-Capillary: 99 mg/dL (ref 70–99)
Glucose-Capillary: 96 mg/dL (ref 70–99)

## 2018-10-09 LAB — APTT: aPTT: 33 s (ref 24–36)

## 2018-10-09 LAB — PHOSPHORUS
Phosphorus: 2.6 mg/dL (ref 2.5–4.6)
Phosphorus: 2.6 mg/dL (ref 2.5–4.6)

## 2018-10-09 LAB — CBC
HCT: 37.7 % (ref 36.0–46.0)
Hemoglobin: 11.6 g/dL — ABNORMAL LOW (ref 12.0–15.0)
MCH: 27 pg (ref 26.0–34.0)
MCHC: 30.8 g/dL (ref 30.0–36.0)
MCV: 87.7 fL (ref 80.0–100.0)
Platelets: 245 10*3/uL (ref 150–400)
RBC: 4.3 MIL/uL (ref 3.87–5.11)
RDW: 16.9 % — ABNORMAL HIGH (ref 11.5–15.5)
WBC: 2.6 10*3/uL — ABNORMAL LOW (ref 4.0–10.5)
nRBC: 0 % (ref 0.0–0.2)

## 2018-10-09 LAB — PROTIME-INR
INR: 1.16
Prothrombin Time: 14.7 seconds (ref 11.4–15.2)

## 2018-10-09 MED ORDER — HEPARIN (PORCINE) IN NACL 100-0.45 UNIT/ML-% IJ SOLN
700.0000 [IU]/h | INTRAMUSCULAR | Status: DC
  Administered 2018-10-09 – 2018-10-11 (×2): 700 [IU]/h via INTRAVENOUS
  Filled 2018-10-09 (×2): qty 250

## 2018-10-09 MED ORDER — POTASSIUM CHLORIDE CRYS ER 20 MEQ PO TBCR
40.0000 meq | EXTENDED_RELEASE_TABLET | Freq: Once | ORAL | Status: AC
  Administered 2018-10-09: 40 meq via ORAL
  Filled 2018-10-09: qty 2

## 2018-10-09 NOTE — Progress Notes (Signed)
[facility]  Patient Status: Active (receiving services up to time of hospitalization)  [facility] is providing the following services: RN, PT and OT  If patient discharges after hours, please call [phone].   Theresa Howe 10/09/2018, 9:42 AM

## 2018-10-09 NOTE — Progress Notes (Signed)
PROGRESS NOTE    Theresa Howe  YQM:[id number] DOB: 04-Sep-1931 DOA: 10/05/2018 PCP: [name], MD   Brief Narrative:  84 year old with past medical history relevant for neuroendocrine tumor diagnosed in 2014 complicated by peritoneal carcinomatosis status post bowel resection x2 (12/04/2017 and 12/27/2017), failure to thrive status post G-tube, DVT on apixaban, type 2 diabetes on insulin admitted with recurrent small bowel obstruction being managed nonoperatively.   Assessment & Plan:   Active Problems:   Diabetes with neurologic complications (HCC)   Partial small bowel obstruction (HCC)   History of DVT (deep vein thrombosis)   Hypomagnesemia   Hypokalemia   #) Recurrent SBO in the setting of peritoneal carcinomatosis due to neuroendocrine tumor: Patient is currently doing much better with increasing return of bowel function. -General surgery following, appreciate recommendations - Continue clear liquid diet, general surgery will advance -Continue slow increase of home tube feeds  #) Failure to thrive status post G-tube: -Tube feeds increased to 20 cc an hour, slowly go up to home dose  #) DVT: General surgery is concerned about patient possibly needing OR for SBO returns or does not improve. -Hold apixaban 2.5 mg twice daily -Start heparin drip without bolus  #) Type 2 diabetes: -Hold detemir 4 units nightly -Sliding scale insulin, Q's 4 hours  Fluids: Tolerating p.o.   electrolyte: Monitor and supplement Nutrition: Per above  Disposition: Pending baseline tube feeds and tolerating p.o.  Full code   Consultants:   General surgery  Procedures:   None  Antimicrobials:   None   Subjective: Patient reports she is doing well.  She reports multiple bowel movements as well as passing gas.  She is been able to tolerate clear liquids well.  She has not had any nausea or vomiting.  Her tube feeds are going well.  Objective: Vitals:   10/08/18 1300  10/08/18 2220 10/09/18 0019 10/09/18 0503  BP: 107/70 130/70  (!) 102/54  Pulse: 80 77  71  Resp: 20 18  18   Temp: 98.3 F (36.8 C) 98.3 F (36.8 C)  98.1 F (36.7 C)  TempSrc: Oral Oral  Oral  SpO2: 100% 97%  98%  Weight:   43.1 kg   Height:        Intake/Output Summary (Last 24 hours) at 10/09/2018 1304 Last data filed at 10/09/2018 1000 Gross per 24 hour  Intake 1785 ml  Output 300 ml  Net 1485 ml   Filed Weights   10/05/18 0918 10/09/18 0019  Weight: 43.1 kg 43.1 kg    Examination:  General exam: Appears calm and comfortable  Respiratory system: Clear to auscultation. Respiratory effort normal. Cardiovascular system: Regular rate and rhythm, no murmurs Gastrointestinal system: Soft, mildly distended, diminished bowel sounds, no rebound or guarding. Central nervous system: Grossly intact, moving all extremities Extremities: No lower extremity edema. Skin: PEG tube site is clean dry and intact Psychiatry: Cannot assess due to underlying dementia    Data Reviewed: I have personally reviewed following labs and imaging studies  CBC: Recent Labs  Lab 10/05/18 1002 10/06/18 0413 10/07/18 0434 10/08/18 0951  WBC 3.0* 2.7* 2.6* 3.7*  NEUTROABS 1.7  --   --   --   HGB 12.6 10.2* 10.5* 12.0  HCT 40.3 33.4* 33.9* 39.1  MCV 86.7 88.4 89.2 88.9  PLT 241 201 207 230   Basic Metabolic Panel: Recent Labs  Lab 10/05/18 1002 10/06/18 0413 10/07/18 0434 10/08/18 0951 10/08/18 1632 10/09/18 0431  NA 139 139 137 139  --  139  K 4.1 3.3* 3.5 4.1  --  3.4*  CL 105 108 109 108  --  107  CO2 23 27 23 25   --  24  GLUCOSE 144* 105* 90 101*  --  112*  BUN 15 11 8  5*  --  7*  CREATININE 0.82 0.74 0.71 0.83  --  0.63  CALCIUM 8.7* 8.1* 8.4* 8.7*  --  8.5*  MG  --  1.5* 2.2 1.5* 1.6* 1.8  PHOS  --   --   --  3.0 2.7 2.6   GFR: Estimated Creatinine Clearance: 35 mL/min (by C-G formula based on SCr of 0.63 mg/dL). Liver Function Tests: Recent Labs  Lab 10/05/18 1002  10/06/18 0413  AST 30 20  ALT 23 18  ALKPHOS 60 50  BILITOT 1.0 0.9  PROT 6.6 5.3*  ALBUMIN 3.3* 2.9*   Recent Labs  Lab 10/05/18 1002  LIPASE 43   No results for input(s): AMMONIA in the last 168 hours. Coagulation Profile: No results for input(s): INR, PROTIME in the last 168 hours. Cardiac Enzymes: No results for input(s): CKTOTAL, CKMB, CKMBINDEX, TROPONINI in the last 168 hours. BNP (last 3 results) No results for input(s): PROBNP in the last 8760 hours. HbA1C: No results for input(s): HGBA1C in the last 72 hours. CBG: Recent Labs  Lab 10/08/18 2150 10/08/18 2352 10/09/18 0353 10/09/18 0751 10/09/18 1152  GLUCAP 115* 119* 107* 104* 99   Lipid Profile: No results for input(s): CHOL, HDL, LDLCALC, TRIG, CHOLHDL, LDLDIRECT in the last 72 hours. Thyroid Function Tests: No results for input(s): TSH, T4TOTAL, FREET4, T3FREE, THYROIDAB in the last 72 hours. Anemia Panel: No results for input(s): VITAMINB12, FOLATE, FERRITIN, TIBC, IRON, RETICCTPCT in the last 72 hours. Sepsis Labs: No results for input(s): PROCALCITON, LATICACIDVEN in the last 168 hours.  Recent Results (from the past 240 hour(s))  Culture, Urine     Status: None (Preliminary result)   Collection Time: 10/07/18 12:12 AM  Result Value Ref Range Status   Specimen Description   Final    URINE, RANDOM Performed at [facility], [address], [place], [place] [address]    Special Requests   Final    NONE Performed at [facility], [address] [place], [place] [address]    Culture   Final    CULTURE REINCUBATED FOR BETTER GROWTH Performed at [facility], [address] [place], [place] [address]    Report Status PENDING  Incomplete         Radiology Studies: No results found.      Scheduled Meds: . docusate sodium  100 mg Oral BID  . feeding supplement  1 Container Oral TID BM  . insulin aspart  0-9 Units Subcutaneous TID WC    Continuous Infusions: . feeding supplement (VITAL 1.5 CAL) 1,000 mL (10/08/18 1115)     LOS: 3 days    Time spent: 35    Delaine Lame, MD [facility]  If 7PM-7AM, please contact night-coverage [email or web address] Password [email or web address] 10/09/2018, 1:04 PM

## 2018-10-09 NOTE — Progress Notes (Signed)
  [facility] [place] [facility] Progress Note     Subjective: CC-  Patient denies any abdominal pain, denies nausea or vomiting. Denies bloating. She multiple BMs now and passing gas. Tolerating full liquids and TFs at 20cc/hr. Likes [employer].  Objective: Vital signs in last 24 hours: Temp:  [98.1 F (36.7 C)-98.3 F (36.8 C)] (P) 98.1 F (36.7 C) (10/23 0503) Pulse Rate:  [71-80] (P) 71 (10/23 0503) Resp:  [18-20] (P) 18 (10/23 0503) BP: (107-130)/(70) (P) 102/54 (10/23 0503) SpO2:  [97 %-100 %] (P) 98 % (10/23 0503) Weight:  [43.1 kg] 43.1 kg (10/23 0019) Last BM Date: 09/30/18  Intake/Output from previous day: 10/22 0701 - 10/23 0700 In: 1765.3 [P.O.:1340; NG/GT:375.3; IV Piggyback:50] Out: 300 [Urine:300] Intake/Output this shift: No intake/output data recorded.  PE: Gen:  Alert, NAD, pleasant HEENT: EOM's intact, pupils equal and round Card:  RRR Pulm:  CTAB, no W/R/R, effort normal Abd: Soft, ND, NT, +BS, G-tube site cdi Ext:  Muscle wasting noted to BUE/BLE Psych: Alert and oriented to self and location Skin: no rashes noted, warm and dry  Lab Results:  Recent Labs    10/07/18 0434 10/08/18 0951  WBC 2.6* 3.7*  HGB 10.5* 12.0  HCT 33.9* 39.1  PLT 207 230   BMET Recent Labs    10/08/18 0951 10/09/18 0431  NA 139 139  K 4.1 3.4*  CL 108 107  CO2 25 24  GLUCOSE 101* 112*  BUN 5* 7*  CREATININE 0.83 0.63  CALCIUM 8.7* 8.5*   PT/INR No results for input(s): LABPROT, INR in the last 72 hours. CMP     Component Value Date/Time   NA 139 10/09/2018 0431   K 3.4 (L) 10/09/2018 0431   CL 107 10/09/2018 0431   CO2 24 10/09/2018 0431   GLUCOSE 112 (H) 10/09/2018 0431   BUN 7 (L) 10/09/2018 0431   CREATININE 0.63 10/09/2018 0431   CREATININE 0.88 09/03/2018 1519   CALCIUM 8.5 (L) 10/09/2018 0431   PROT 5.3 (L) 10/06/2018 0413   ALBUMIN 2.9 (L) 10/06/2018 0413   AST 20 10/06/2018 0413   ALT 18 10/06/2018 0413   ALKPHOS 50 10/06/2018 0413   BILITOT 0.9 10/06/2018 0413   GFRNONAA >60 10/09/2018 0431   GFRNONAA 60 09/03/2018 1519   GFRAA >60 10/09/2018 0431   GFRAA 69 09/03/2018 1519   Lipase     Component Value Date/Time   LIPASE 43 10/05/2018 1002       Studies/Results: No results found.  Anti-infectives: Anti-infectives (From admission, onward)   None       Assessment/Plan DM HTN HLD H/o DVT - eliquis restarted 10/21, would recommend holding this until we know that patient is going to tolerate diet/TF  SBO, h/o neuroendocrine tumor with carcinomatosis - s/p 2 ex laps for bowel resections by Dr. Barry Dienes for the above - recent admission 10/9>>10/15 for SBO that resolved with medical management - readmitted 10/19 - Tolerating full liquids and having bowel function. Continue full liquids - would avoid solid food moving forward especially particulate such as fruits/vegetables as these seem to be what precipitates these pSBOs and is likely 2/2 carcinomatosis. Encourage OOB/mobilization.  ID - none FEN - IVF, G-tube clamped, CLD, Boost TID, TF - advance to goal VTE - SCDs, eliquis Foley - none   LOS: 3 days   Sharon Mt. Dema Severin, M.D. [facility]

## 2018-10-09 NOTE — Care Management Important Message (Signed)
Important Message  Patient Details  Name: Theresa Howe MRN: [id number] Date of Birth: 11/09/1931   Medicare Important Message Given:  Yes    Kerin Salen 10/09/2018, 11:05 AMImportant Message  Patient Details  Name: Theresa Howe MRN: [id number] Date of Birth: [birth year]   Medicare Important Message Given:  Yes    Kerin Salen 10/09/2018, 11:05 AM

## 2018-10-09 NOTE — Evaluation (Signed)
Physical Therapy Evaluation Patient Details Name: Theresa Howe MRN: [id number] DOB: 1931/10/20 Today's Date: 10/09/2018   History of Present Illness  84 YO female admitted 10/19 for SBO. Pt just d/ced from [facility] on 10/15 for same issue, and was readmitted for vomiting and abdominal pain with tube feedings. 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   Clinical Impression   Pt presents with impaired balance, increased time and effort to perform mobility tasks, periods of unsteadiness and reaching for environment when ambulating, and decreased tolerance for mobility. Pt to benefit from acute PT to address deficits. Pt ambulated 25 ft in room, and 350 ft in hallway with no assistive device but pt with periods of unsteadiness. PT to progress mobility as tolerated, and will continue to follow acutely.      Follow Up Recommendations Home health PT;Supervision for mobility/OOB(continue with [facility] services )    Equipment Recommendations  None recommended by PT    Recommendations for Other Services       Precautions / Restrictions Precautions Precautions: Fall Precaution Comments: feeding tube  Restrictions Weight Bearing Restrictions: No      Mobility  Bed Mobility Overal bed mobility: Needs Assistance Bed Mobility: Supine to Sit;Sit to Supine     Supine to sit: Min guard;HOB elevated Sit to supine: Min guard;HOB elevated   General bed mobility comments: Tube feed paused and tube capped for mobility. Min guard for safety. Increased time and effort to get to EOB.   Transfers Overall transfer level: Needs assistance Equipment used: None Transfers: Sit to/from Stand Sit to Stand: Min guard         General transfer comment: Min guard for safety. Increased time and effort. sit to stand x2, once from bed and once from toilet for BM and urination.    Ambulation/Gait Ambulation/Gait assistance: Supervision;Min guard Gait Distance (Feet): 375 Feet Assistive device: None Gait Pattern/deviations: Step-through pattern;Decreased stride length;Narrow base of support Gait velocity: slightly decr    General Gait Details: Pt with supervision to min guard for safety. Pt reaching for environment for steadying at times, and had mild periods of unsteadiness that were pt-recovered with reaching for environment or weightshifting.   Stairs            Wheelchair Mobility    Modified Rankin (Stroke Patients Only)       Balance Overall balance assessment: Needs assistance Sitting-balance support: No upper extremity supported Sitting balance-Leahy Scale: Good     Standing balance support: No upper extremity supported Standing balance-Leahy Scale: Fair Standing balance comment: unable to accept challenge, reaches for environment with ambulation PRN Single Leg Stance - Right Leg: 6 Single Leg Stance - Left Leg: 4 Tandem Stance - Right Leg: 30(ankle strategy for balance noted ) Tandem Stance - Left Leg: 30(ankle strategy for balance noted )                     Pertinent Vitals/Pain Pain Assessment: No/denies pain(abdomen was "growling" but no pain)    Home Living Family/patient expects to be discharged to:: Private residence Living Arrangements: Other relatives Available Help at Discharge: Family;Available 24 hours/day Type of Home: House Home Access: Stairs to enter Entrance Stairs-Rails: Doctor, general practice of Steps: 8 Home Layout: Two level;Able to live on main level with bedroom/bathroom Home Equipment: Dan Humphreys - 2 wheels;Cane - single point      Prior  Function Level of Independence: Needs assistance   Gait / Transfers Assistance Needed: Pt ambulates without AD, pt describes distances walked at one time as 150 ft. Pt receiving PT, OT, RN home health prior to admission, and will resume upon return home.  Pt also has caregiver who comes in daily for 4 hours a day, and pt states that she doesn't really need the caregiver for anything.   ADL's / Homemaking Assistance Needed: Pt has assist with tube feeds from family members   Comments: Pt living with nephew and his children, previously lived alone      Hand Dominance   Dominant Hand: Right    Extremity/Trunk Assessment   Upper Extremity Assessment Upper Extremity Assessment: Overall WFL for tasks assessed    Lower Extremity Assessment Lower Extremity Assessment: Generalized weakness    Cervical / Trunk Assessment Cervical / Trunk Assessment: Normal  Communication   Communication: No difficulties  Cognition Arousal/Alertness: Awake/alert Behavior During Therapy: WFL for tasks assessed/performed Overall Cognitive Status: Impaired/Different from baseline Area of Impairment: Orientation                 Orientation Level: Person             General Comments: Pt did not correctly state birthday, even with multiple attempts       General Comments      Exercises     Assessment/Plan    PT Assessment Patient needs continued PT services  PT Problem List Decreased strength;Pain;Decreased activity tolerance;Decreased balance;Decreased mobility       PT Treatment Interventions DME instruction;Therapeutic activities;Gait training;Patient/family education;Therapeutic exercise;Stair training;Balance training;Functional mobility training    PT Goals (Current goals can be found in the Care Plan section)  Acute Rehab PT Goals Patient Stated Goal: go home  PT Goal Formulation: With patient Time For Goal Achievement: 10/23/18 Potential to Achieve Goals: Good    Frequency Min 3X/week   Barriers to discharge        Co-evaluation               AM-PAC PT "6 Clicks" Daily Activity  Outcome Measure Difficulty turning over in bed (including adjusting bedclothes, sheets and blankets)?: Unable Difficulty moving  from lying on back to sitting on the side of the bed? : Unable Difficulty sitting down on and standing up from a chair with arms (e.g., wheelchair, bedside commode, etc,.)?: Unable Help needed moving to and from a bed to chair (including a wheelchair)?: A Little Help needed walking in hospital room?: A Little Help needed climbing 3-5 steps with a railing? : A Little 6 Click Score: 12    End of Session Equipment Utilized During Treatment: Gait belt Activity Tolerance: Patient tolerated treatment well Patient left: in bed;with bed alarm set;with call bell/phone within reach Nurse Communication: Mobility status;Other (comment)(starting/stopping tube feed ) PT Visit Diagnosis: Unsteadiness on feet (R26.81);Difficulty in walking, not elsewhere classified (R26.2)    Time: 1250-1320 PT Time Calculation (min) (ACUTE ONLY): 30 min   Charges:   PT Evaluation $PT Eval Low Complexity: 1 Low PT Treatments $Gait Training: 8-22 mins       Nicola Police, PT Acute Rehabilitation Services Pager [phone]  Office [phone]   Jabree Pernice D Despina Hidden 10/09/2018, 2:31 PM

## 2018-10-09 NOTE — Progress Notes (Signed)
ANTICOAGULATION CONSULT NOTE - Initial Consult  Pharmacy Consult for heparin drip Indication: history of VTE  No Known Allergies  Patient Measurements: Height: 5\' 6"  (167.6 cm) Weight: 95 lb 0.3 oz (43.1 kg) IBW/kg (Calculated) : 59.3 Heparin Dosing Weight = n/a, use actual body weight  Vital Signs: Temp: 97.7 F (36.5 C) (10/23 1357) Temp Source: Oral (10/23 1357) BP: 109/72 (10/23 1357) Pulse Rate: 76 (10/23 1357)  Labs: Recent Labs    10/07/18 0434 10/08/18 0951 10/09/18 0431 10/09/18 1454  HGB 10.5* 12.0  --  11.6*  HCT 33.9* 39.1  --  37.7  PLT 207 230  --  245  APTT  --   --   --  33  LABPROT  --   --   --  14.7  INR  --   --   --  1.16  CREATININE 0.71 0.83 0.63  --     Estimated Creatinine Clearance: 35 mL/min (by C-G formula based on SCr of 0.63 mg/dL).   Medical History: Past Medical History:  Diagnosis Date  . Abdominal mass 10/2017   SMALL BOWEL  . Diabetes mellitus    type 2  . Dyspnea   . Failure to thrive (0-17) 12/24/2017  . Glaucoma   . Hyperlipidemia   . Hypertension   . Neuropathy   . Osteoporosis   . Wheezing     Assessment: Patient on apixaban 2.5 mg PO BID for history of VTE. Anticoagulation is being converted to a heparin drip in anticipation of possible procedures while inpatient.  Last apixaban dose given 10/23 @ 1000  Today, 10/09/18  Last apixaban dose 10/23 @ 1000  Baseline labs ordered/obtained  Hgb 11.6  Plt 245  INR 1.16  APTT 33  HL - pending, anticipate it could be falsely elevated 2/2 recent apixaban   Goal of Therapy:  Heparin level 0.3-0.7 units/ml  APTT 66-102 seconds Monitor platelets by anticoagulation protocol: Yes   Plan:   Apixaban discontinued  Heparin drip to begin @ 2200 this evening (12 hours after last apixaban dose) with NO bolus since patient is currently anticoagulated  Start heparin @ 700 units/hr - pt was therapeutic on this infusion rate on previous admission this  month  Check APTT and HL 8 hours - will need to dose off of APTT until HL and APTT correlate  Daily CBC  Monitor for signs/symptoms of bleeding  Lenis Noon, PharmD 10/09/2018,3:55 PM

## 2018-10-10 LAB — GLUCOSE, CAPILLARY
GLUCOSE-CAPILLARY: 135 mg/dL — AB (ref 70–99)
GLUCOSE-CAPILLARY: 123 mg/dL — AB (ref 70–99)
Glucose-Capillary: 89 mg/dL (ref 70–99)
Glucose-Capillary: 171 mg/dL — ABNORMAL HIGH (ref 70–99)
Glucose-Capillary: 65 mg/dL — ABNORMAL LOW (ref 70–99)
Glucose-Capillary: 95 mg/dL (ref 70–99)
Glucose-Capillary: 90 mg/dL (ref 70–99)

## 2018-10-10 LAB — COMPREHENSIVE METABOLIC PANEL WITH GFR
ALT: 29 U/L (ref 0–44)
Alkaline Phosphatase: 55 U/L (ref 38–126)
BUN: 10 mg/dL (ref 8–23)
Creatinine, Ser: 0.75 mg/dL (ref 0.44–1.00)
GFR calc non Af Amer: >60 mL/min (ref >60)
Total Bilirubin: 0.7 mg/dL (ref 0.3–1.2)
Total Protein: 6.3 g/dL — ABNORMAL LOW (ref 6.5–8.1)

## 2018-10-10 LAB — COMPREHENSIVE METABOLIC PANEL
AST: 25 U/L (ref 15–41)
Albumin: 3.1 g/dL — ABNORMAL LOW (ref 3.5–5.0)
Anion gap: 5 (ref 5–15)
CO2: 26 mmol/L (ref 22–32)
Calcium: 9 mg/dL (ref 8.9–10.3)
Chloride: 109 mmol/L (ref 98–111)
GFR calc Af Amer: >60 mL/min (ref >60)
Glucose, Bld: 98 mg/dL (ref 70–99)
Potassium: 3.9 mmol/L (ref 3.5–5.1)
Sodium: 140 mmol/L (ref 135–145)

## 2018-10-10 LAB — CBC
HCT: 36.5 % (ref 36.0–46.0)
Hemoglobin: 11.4 g/dL — ABNORMAL LOW (ref 12.0–15.0)
MCH: 27.3 pg (ref 26.0–34.0)
MCHC: 31.2 g/dL (ref 30.0–36.0)
MCV: 87.3 fL (ref 80.0–100.0)
Platelets: 244 10*3/uL (ref 150–400)
RBC: 4.18 MIL/uL (ref 3.87–5.11)
RDW: 16.8 % — ABNORMAL HIGH (ref 11.5–15.5)
WBC: 3 K/uL — ABNORMAL LOW (ref 4.0–10.5)
nRBC: 0 % (ref 0.0–0.2)

## 2018-10-10 LAB — URINE CULTURE: Culture: >=100000 — AB

## 2018-10-10 LAB — APTT
aPTT: 75 s — ABNORMAL HIGH (ref 24–36)
aPTT: 81 seconds — ABNORMAL HIGH (ref 24–36)

## 2018-10-10 LAB — HEPARIN LEVEL (UNFRACTIONATED): Heparin Unfractionated: 1.02 [IU]/mL — ABNORMAL HIGH (ref 0.30–0.70)

## 2018-10-10 MED ORDER — ENSURE ENLIVE PO LIQD
237.0000 mL | Freq: Two times a day (BID) | ORAL | Status: DC
  Administered 2018-10-11 (×2): 237 mL via ORAL

## 2018-10-10 MED ORDER — VITAL 1.5 CAL PO LIQD
1000.0000 mL | ORAL | Status: DC
  Administered 2018-10-10 – 2018-10-11 (×2): 1000 mL
  Filled 2018-10-10 (×3): qty 1000

## 2018-10-10 NOTE — Care Management Note (Signed)
Case Management Note  Patient Details  Name: Theresa Howe MRN: [id number] Date of Birth: 04/12/1931  Subjective/Objective: GT-TF. [facility] already following awaiting HHRN/PT/OT orders.                   Action/Plan:d/c home w/HHC.   Expected Discharge Date:                  Expected Discharge Plan:  [place]  In-House Referral:     Discharge planning Services  CM Consult  Post Acute Care Choice:  Home Health(Active w/[facility] RN/PT/OT) Choice offered to:     DME Arranged:    DME Agency:     HH Arranged:    HH Agency:     Status of Service:  In process, will continue to follow  If discussed at Long Length of Stay Meetings, dates discussed:    Additional Comments:  Dessa Phi, RN 10/10/2018, 12:59 PM

## 2018-10-10 NOTE — Progress Notes (Signed)
ANTICOAGULATION CONSULT NOTE - Follow Up Consult  Pharmacy Consult for heparin Indication: hx DVT (home Eliquis on hold)  No Known Allergies  Patient Measurements: Height: 5\' 6"  (167.6 cm) Weight: 93 lb 4.1 oz (42.3 kg) IBW/kg (Calculated) : 59.3 Heparin Dosing Weight: 43 kg  Vital Signs: Temp: 98.2 F (36.8 C) (10/24 0500) Temp Source: Oral (10/24 0500) BP: 105/63 (10/24 0500) Pulse Rate: 72 (10/24 0500)  Labs: Recent Labs    10/08/18 0951 10/09/18 0431 10/09/18 1454 10/10/18 0611  HGB 12.0  --  11.6* 11.4*  HCT 39.1  --  37.7 36.5  PLT 230  --  245 244  APTT  --   --  33 75*  LABPROT  --   --  14.7  --   INR  --   --  1.16  --   HEPARINUNFRC  --   --  1.98*  --   CREATININE 0.83 0.63  --  0.75    Estimated Creatinine Clearance: 34.3 mL/min (by C-G formula based on SCr of 0.75 mg/dL).   Medications:  - on Eliquis 2.5 mg bid PTA  Assessment: Patient is an 84 y.o F on with hx DVT on Eliquis PTA, peritoneal carcinomatosis, and recently hospitalized for SBO presented to the ED on 10/05/18 with c/o n/v and abd pain.  Abd CT on 10/19 showed SBO. Anticoagulation converted to heparin drip on 10/23 in case surgical intervention is needed.  Today, 10/10/2018: - aPTT 75 at goal - HL 1.02 remains falsely elevated - CBC stable - no bleeding reported   Goal of Therapy:  aPTT 66-102s Heparin level 0.3-0.7 units/ml Monitor platelets by anticoagulation protocol: Yes   Plan:  - Continue heparin infusion at 700 units/hr.  - Recheck aPTT in 8 hours to confirm - Daily HL, CBC  Camelia Stelzner D 10/10/2018,7:58 AM

## 2018-10-10 NOTE — Progress Notes (Signed)
Physical Therapy Treatment Patient Details Name: Theresa Howe MRN: [id number] DOB: 1931/10/31 Today's Date: 10/10/2018    History of Present Illness 84 YO female admitted 10/19 for SBO. Pt just d/ced from [facility] on 10/15 for same issue, and was readmitted for vomiting and abdominal pain with tube feedings. 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     PT Comments    Pt with improved ambulation distance this session, and proficiently navigated stairs. Pt with some unsteadiness during ambulation today, pt self-corrected. PT to continue to follow, and will implement HEP during session tomorrow.    Follow Up Recommendations  Home health PT;Supervision for mobility/OOB(continue with [facility] services )     Equipment Recommendations  None recommended by PT    Recommendations for Other Services       Precautions / Restrictions Precautions Precautions: Fall Precaution Comments: feeding tube  Restrictions Weight Bearing Restrictions: No    Mobility  Bed Mobility Overal bed mobility: Needs Assistance Bed Mobility: Supine to Sit;Sit to Supine     Supine to sit: HOB elevated;Min guard Sit to supine: Supervision;HOB elevated   General bed mobility comments: Tube feed paused/capped for mobility. Min guard for supine to sit for safety. Increased time and effort for supine<>sit.   Transfers Overall transfer level: Needs assistance Equipment used: None Transfers: Sit to/from Stand Sit to Stand: Min guard         General transfer comment: Min guard for safety. Sit to stand x3 during session for toileting and moving to and from bed.   Ambulation/Gait Ambulation/Gait assistance: Supervision;Min guard Gait Distance (Feet): 400 Feet Assistive device: Rolling walker (2 wheeled) Gait Pattern/deviations: Step-through pattern;Decreased stride length;Narrow  base of support Gait velocity: slightly decr    General Gait Details: Supervision to min guard for safety. Pt with one 2 periods of unsteadiness and lateral step for steadying. PT min guard for these scenarios, but pt self-corrected. No reaching for environment this session.    Stairs Stairs: Yes Stairs assistance: Min guard Stair Management: One rail Left Number of Stairs: 10 General stair comments: Pt with step-over-step stair navigation, pt performed with increased time for safety.    Wheelchair Mobility    Modified Rankin (Stroke Patients Only)       Balance Overall balance assessment: Needs assistance Sitting-balance support: No upper extremity supported Sitting balance-Leahy Scale: Good     Standing balance support: No upper extremity supported Standing balance-Leahy Scale: Fair Standing balance comment: unable to accept challenge, reaches for environment with ambulation PRN                            Cognition Arousal/Alertness: Awake/alert Behavior During Therapy: WFL for tasks assessed/performed Overall Cognitive Status: Within Functional Limits for tasks assessed                                        Exercises      General Comments        Pertinent Vitals/Pain Pain Assessment: No/denies pain    Home Living                      Prior Function            PT Goals (current goals  can now be found in the care plan section) Acute Rehab PT Goals Patient Stated Goal: go home  PT Goal Formulation: With patient Time For Goal Achievement: 10/23/18 Potential to Achieve Goals: Good Progress towards PT goals: Progressing toward goals    Frequency    Min 3X/week      PT Plan Current plan remains appropriate    Co-evaluation              AM-PAC PT "6 Clicks" Daily Activity  Outcome Measure  Difficulty turning over in bed (including adjusting bedclothes, sheets and blankets)?: None Difficulty moving  from lying on back to sitting on the side of the bed? : Unable Difficulty sitting down on and standing up from a chair with arms (e.g., wheelchair, bedside commode, etc,.)?: Unable Help needed moving to and from a bed to chair (including a wheelchair)?: A Little Help needed walking in hospital room?: A Little Help needed climbing 3-5 steps with a railing? : A Little 6 Click Score: 15    End of Session Equipment Utilized During Treatment: Gait belt Activity Tolerance: Patient tolerated treatment well Patient left: in bed;with bed alarm set;with call bell/phone within reach Nurse Communication: Mobility status;Other (comment)(starting/stopping tube feed ) PT Visit Diagnosis: Unsteadiness on feet (R26.81);Difficulty in walking, not elsewhere classified (R26.2)     Time: 0981-1914 PT Time Calculation (min) (ACUTE ONLY): 23 min  Charges:  $Gait Training: 23-37 mins                    Nicola Police, PT Acute Rehabilitation Services Pager [phone]  Office [phone]  Theresa Howe D Theresa Howe 10/10/2018, 2:18 PM

## 2018-10-10 NOTE — Progress Notes (Signed)
PROGRESS NOTE    Theresa Howe  ZOX:[id number] DOB: Mar 23, 1931 DOA: 10/05/2018 PCP: [name], MD   Brief Narrative:  84 year old with past medical history relevant for neuroendocrine tumor diagnosed in 2014 complicated by peritoneal carcinomatosis status post bowel resection x2 (12/04/2017 and 12/27/2017), failure to thrive status post G-tube, DVT on apixaban, type 2 diabetes on insulin admitted with recurrent small bowel obstruction being managed nonoperatively.   Assessment & Plan:   Active Problems:   Diabetes with neurologic complications (HCC)   Partial small bowel obstruction (HCC)   History of DVT (deep vein thrombosis)   Hypomagnesemia   Hypokalemia   #) Recurrent SBO in the setting of peritoneal carcinomatosis due to neuroendocrine tumor: Patient is currently doing much better with increasing return of bowel function. -General surgery following, appreciate recommendations - Continue full liquid diet, general surgery recommends patient should continue this indefinitely and never have any solids -Increase tube feeds to 45 mL's an hour continuous which is goal  #) Failure to thrive status post G-tube: -Increase tube feeds per above  #) DVT: General surgery is concerned about patient possibly needing OR for SBO returns or does not improve. -Hold apixaban 2.5 mg twice daily -Start heparin drip without bolus  #) Type 2 diabetes: -Hold detemir 4 units nightly -Sliding scale insulin, Q's 4 hours  Fluids: Tolerating p.o.   electrolyte: Monitor and supplement Nutrition: Per above  Disposition: Pending baseline tube feeds and tolerating p.o.  Full code   Consultants:   General surgery  Procedures:   None  Antimicrobials:   None   Subjective: Patient reports she is doing well.  She reports that she is having bowel movements.  She denies any nausea nausea, vomiting, diarrhea, cough, congestion.  Objective: Vitals:   10/09/18 2334 10/10/18 0500  10/10/18 0803 10/10/18 0956  BP: 129/71 105/63 (!) 102/57 110/64  Pulse: 74 72 79 90  Resp: 18 18 17 18   Temp: 99.1 F (37.3 C) 98.2 F (36.8 C) 98.4 F (36.9 C) 98.6 F (37 C)  TempSrc: Oral Oral Oral Oral  SpO2: 99% 98% 99% 97%  Weight:  42.3 kg    Height:        Intake/Output Summary (Last 24 hours) at 10/10/2018 1204 Last data filed at 10/10/2018 1000 Gross per 24 hour  Intake 1005.76 ml  Output 200 ml  Net 805.76 ml   Filed Weights   10/05/18 0918 10/09/18 0019 10/10/18 0500  Weight: 43.1 kg 43.1 kg 42.3 kg    Examination:  General exam: Appears calm and comfortable  Respiratory system: Clear to auscultation. Respiratory effort normal. Cardiovascular system: Regular rate and rhythm, no murmurs Gastrointestinal system: Soft, mildly distended, diminished bowel sounds, no rebound or guarding. Central nervous system: Grossly intact, moving all extremities Extremities: No lower extremity edema. Skin: PEG tube site is clean dry and intact Psychiatry: Cannot assess due to underlying dementia    Data Reviewed: I have personally reviewed following labs and imaging studies  CBC: Recent Labs  Lab 10/05/18 1002 10/06/18 0413 10/07/18 0434 10/08/18 0951 10/09/18 1454 10/10/18 0611  WBC 3.0* 2.7* 2.6* 3.7* 2.6* 3.0*  NEUTROABS 1.7  --   --   --   --   --   HGB 12.6 10.2* 10.5* 12.0 11.6* 11.4*  HCT 40.3 33.4* 33.9* 39.1 37.7 36.5  MCV 86.7 88.4 89.2 88.9 87.7 87.3  PLT 241 201 207 230 245 244   Basic Metabolic Panel: Recent Labs  Lab 10/06/18 0413 10/07/18 0434  10/08/18 0951 10/08/18 1632 10/09/18 0431 10/09/18 1704 10/10/18 0611  NA 139 137 139  --  139  --  140  K 3.3* 3.5 4.1  --  3.4*  --  3.9  CL 108 109 108  --  107  --  109  CO2 27 23 25   --  24  --  26  GLUCOSE 105* 90 101*  --  112*  --  98  BUN 11 8 5*  --  7*  --  10  CREATININE 0.74 0.71 0.83  --  0.63  --  0.75  CALCIUM 8.1* 8.4* 8.7*  --  8.5*  --  9.0  MG 1.5* 2.2 1.5* 1.6* 1.8 1.6*   --   PHOS  --   --  3.0 2.7 2.6 2.6  --    GFR: Estimated Creatinine Clearance: 34.3 mL/min (by C-G formula based on SCr of 0.75 mg/dL). Liver Function Tests: Recent Labs  Lab 10/05/18 1002 10/06/18 0413 10/10/18 0611  AST 30 20 25   ALT 23 18 29   ALKPHOS 60 50 55  BILITOT 1.0 0.9 0.7  PROT 6.6 5.3* 6.3*  ALBUMIN 3.3* 2.9* 3.1*   Recent Labs  Lab 10/05/18 1002  LIPASE 43   No results for input(s): AMMONIA in the last 168 hours. Coagulation Profile: Recent Labs  Lab 10/09/18 1454  INR 1.16   Cardiac Enzymes: No results for input(s): CKTOTAL, CKMB, CKMBINDEX, TROPONINI in the last 168 hours. BNP (last 3 results) No results for input(s): PROBNP in the last 8760 hours. HbA1C: No results for input(s): HGBA1C in the last 72 hours. CBG: Recent Labs  Lab 10/09/18 1603 10/09/18 2004 10/09/18 2343 10/10/18 0420 10/10/18 0801  GLUCAP 128* 96 93 89 171*   Lipid Profile: No results for input(s): CHOL, HDL, LDLCALC, TRIG, CHOLHDL, LDLDIRECT in the last 72 hours. Thyroid Function Tests: No results for input(s): TSH, T4TOTAL, FREET4, T3FREE, THYROIDAB in the last 72 hours. Anemia Panel: No results for input(s): VITAMINB12, FOLATE, FERRITIN, TIBC, IRON, RETICCTPCT in the last 72 hours. Sepsis Labs: No results for input(s): PROCALCITON, LATICACIDVEN in the last 168 hours.  Recent Results (from the past 240 hour(s))  Culture, Urine     Status: Abnormal   Collection Time: 10/07/18 12:12 AM  Result Value Ref Range Status   Specimen Description   Final    URINE, RANDOM Performed at [facility], [address] [place], [place] [address]    Special Requests   Final    NONE Performed at [facility], [address] [place], [place] [address]    Culture (A)  Final    >=100,000 COLONIES/mL DIPHTHEROIDS(CORYNEBACTERIUM SPECIES) 50,000 COLONIES/mL PROTEUS MIRABILIS ORGANISM 1 Standardized susceptibility testing for this organism is not  available. Performed at [facility], [address], [place], [place] [address]    Report Status 10/10/2018 FINAL  Final   Organism ID, Bacteria PROTEUS MIRABILIS (A)  Final      Susceptibility   Proteus mirabilis - MIC*    AMPICILLIN <=2 SENSITIVE Sensitive     CEFAZOLIN <=4 SENSITIVE Sensitive     CEFTRIAXONE <=1 SENSITIVE Sensitive     CIPROFLOXACIN <=0.25 SENSITIVE Sensitive     GENTAMICIN <=1 SENSITIVE Sensitive     IMIPENEM 2 SENSITIVE Sensitive     NITROFURANTOIN 128 RESISTANT Resistant     TRIMETH/SULFA <=20 SENSITIVE Sensitive     AMPICILLIN/SULBACTAM <=2 SENSITIVE Sensitive     PIP/TAZO <=4 SENSITIVE Sensitive     *  50,000 COLONIES/mL PROTEUS MIRABILIS         Radiology Studies: No results found.      Scheduled Meds: . docusate sodium  100 mg Oral BID  . feeding supplement  1 Container Oral TID BM  . insulin aspart  0-9 Units Subcutaneous TID WC   Continuous Infusions: . feeding supplement (VITAL 1.5 CAL) 1,000 mL (10/10/18 0959)  . heparin 700 Units/hr (10/10/18 0900)     LOS: 4 days    Time spent: 35    Delaine Lame, MD [facility]  If 7PM-7AM, please contact night-coverage [email or web address] Password [email or web address] 10/10/2018, 12:04 PM

## 2018-10-10 NOTE — Progress Notes (Signed)
I have reviewed and concur with this student's documentation.   

## 2018-10-10 NOTE — Progress Notes (Signed)
Inpatient Diabetes Program Recommendations  AACE/ADA: New Consensus Statement on Inpatient Glycemic Control (2015)  Target Ranges:  Prepandial:   less than 140 mg/dL      Peak postprandial:   less than 180 mg/dL (1-2 hours)      Critically ill patients:  140 - 180 mg/dL   Results for Theresa Howe, Theresa Howe (MRN [id number]) as of 10/10/2018 12:52  Ref. Range 10/08/2018 07:43 10/08/2018 12:12 10/08/2018 15:49 10/08/2018 19:38 10/08/2018 19:54 10/08/2018 21:50 10/08/2018 23:52  Glucose-Capillary Latest Ref Range: 70 - 99 mg/dL 105 (H) 91 159 (H) Novolog 2 units given 63 (L) 74 115 (H) 119 (H)   Results for Theresa Howe, Theresa Howe (MRN [id number]) as of 10/10/2018 12:52  Ref. Range 10/09/2018 03:53 10/09/2018 07:51 10/09/2018 11:52 10/09/2018 16:03 10/09/2018 20:04 10/09/2018 23:43 10/10/2018 04:20 10/10/2018 08:01 10/10/2018 12:01 10/10/2018 12:29  Glucose-Capillary Latest Ref Range: 70 - 99 mg/dL 107 (H) 104 (H) 99 128 (H) 96 93 89 171 (H) Novolog 2 units given 65 (L) 95   Review of Glycemic Control  Diabetes history: DM 2 Outpatient Diabetes medications: Levemir 4 units qhs Current orders for Inpatient glycemic control: Novolog 0-9 units tid  Inpatient Diabetes Program Recommendations:    Every time patient requires 2 units of Novolog based on correction scale patient has hypoglycemia event in the 60's. Consider ordering a Novolog Custom Correction scale starting at 150 mg/dl to avoid hypoglycemia.  -Custom Novolog correction scale 0-5 units       151-200  1 unit      201-250  2 units      251-300  3 units      301-350  4 units      351-400  5 units  Thanks,  Tama Headings RN, MSN, BC-ADM Inpatient Diabetes Coordinator Team Pager [phone] (8a-5p)

## 2018-10-10 NOTE — Progress Notes (Signed)
Nutrition Follow-up  DOCUMENTATION CODES:   Underweight, Severe malnutrition in context of chronic illness  INTERVENTION:   -Continue Vital 1.5 @ 45 ml/hr (1080 ml) via G-tube  This provides 1620 kcal, 73 grams protein, and 821 ml free water. Meets 100% of needs.   -Trial Ensure Enlive po BID, each supplement provides 350 kcal and 20 grams of protein for comfort.   NUTRITION DIAGNOSIS:   Severe Malnutrition related to chronic illness, cancer and cancer related treatments, dysphagia as evidenced by percent weight loss, energy intake < or equal to 75% for > or equal to 1 month, severe fat depletion, severe muscle depletion.  Ongoing  GOAL:   Patient will meet greater than or equal to 90% of their needs  Met with TF  MONITOR:   Diet advancement, Labs, Weight trends, TF tolerance, I & O's, Supplement acceptance, PO intake  REASON FOR ASSESSMENT:   Malnutrition Screening Tool    ASSESSMENT:   Patient with PMH significant for DM, FTT, HLD, HTN, and neuroendocrine tumor with carcinomatosis s/p 2 ex laps for bowel resection by Dr. Barry Dienes. Pt was recent discharged four days ago with SBO which improved conservatively. Presents this admission with complaints of nausea/vomiting. Admitted for SBO likely due to adhesions.    10/19- G-tube placed to suction 10/20- G-tube clamped, advanced to clear liquids 1021- Vital 1.5 started at 20 ml/hr 10/23- diet advanced to full liquid  Pt advanced to goal rate of Vital 1.5 @ 45 ml/hr. Denies any nausea, vomiting, or abdominal pain. Per surgery, plan to keep pt on full liquid diet indefinitely. Pt is consuming chicken/beef broth, jello, and Boost breeze. Pt does not care for Boost and would like to try Ensure. RD to order. Will attempt to speak with nephew regarding home TF.   Once pt is able to discharge recommend bolus feedings: I can of Vital 1.5 (237 ml) four times daily, 30 ml Prostat or other protein modular once daily,  and 200 ml free  water flushes Q6 hours. This provides 1520 kcal, 79 grams protein, and 724 ml free water (1524 ml with flushes). Meets 100% of needs.   Weight noted to decrease from 95 lb on 10/19 to 93 lb today. Will continue to monitor.   Medications reviewed and include: colace Labs reviewed: Mg 1.6 (L)   Diet Order:   Diet Order            Diet full liquid Room service appropriate? Yes; Fluid consistency: Thin  Diet effective now              EDUCATION NEEDS:   Education needs have been addressed  Skin:  Skin Assessment: Reviewed RN Assessment  Last BM:  10/10/18  Height:   Ht Readings from Last 1 Encounters:  10/05/18 _0  (1.676 m)    Weight:   Wt Readings from Last 1 Encounters:  10/10/18 42.3 kg    Ideal Body Weight:  59.1 kg  BMI:  Body mass index is 15.05 kg/m.  Estimated Nutritional Needs:   Kcal:  1500-1700 kcal  Protein:  70-85 grams  Fluid:  >/= 1.5 L/day   Mariana Single RD, LDN Clinical Nutrition Pager # - [phone]

## 2018-10-10 NOTE — Progress Notes (Signed)
ANTICOAGULATION CONSULT NOTE - Follow Up Consult  Pharmacy Consult for heparin Indication: hx DVT (home Eliquis on hold)  No Known Allergies  Patient Measurements: Height: 5\' 6"  (167.6 cm) Weight: 93 lb 4.1 oz (42.3 kg) IBW/kg (Calculated) : 59.3 Heparin Dosing Weight: 43 kg  Vital Signs: Temp: 97.9 F (36.6 C) (10/24 1413) Temp Source: Oral (10/24 1413) BP: 113/62 (10/24 1413) Pulse Rate: 75 (10/24 1413)  Labs: Recent Labs    10/08/18 0951 10/09/18 0431 10/09/18 1454 10/10/18 0611 10/10/18 1549  HGB 12.0  --  11.6* 11.4*  --   HCT 39.1  --  37.7 36.5  --   PLT 230  --  245 244  --   APTT  --   --  33 75* 81*  LABPROT  --   --  14.7  --   --   INR  --   --  1.16  --   --   HEPARINUNFRC  --   --  1.98* 1.02*  --   CREATININE 0.83 0.63  --  0.75  --     Estimated Creatinine Clearance: 34.3 mL/min (by C-G formula based on SCr of 0.75 mg/dL).   Medications:  - on Eliquis 2.5 mg bid PTA  Assessment: Patient is an 84 y.o F on with hx DVT on Eliquis PTA, peritoneal carcinomatosis, and recently hospitalized for SBO presented to the ED on 10/05/18 with c/o n/v and abd pain.  Abd CT on 10/19 showed SBO. Anticoagulation converted to heparin drip on 10/23 in case surgical intervention is needed.  Today, 10/10/2018: - aPTT continues to be at goal - HL 1.02 remains falsely elevated - CBC stable - no bleeding reported   Goal of Therapy:  aPTT 66-102s Heparin level 0.3-0.7 units/ml Monitor platelets by anticoagulation protocol: Yes   Plan:  - Continue heparin infusion at 700 units/hr.  - Daily HL, aPTT, and CBC  Adrian Saran, PharmD, BCPS Pager [phone] 10/10/2018 4:24 PM

## 2018-10-11 ENCOUNTER — Telehealth: Payer: Self-pay | Admitting: *Deleted

## 2018-10-11 LAB — CBC
HCT: 35.3 % — ABNORMAL LOW (ref 36.0–46.0)
Hemoglobin: 10.8 g/dL — ABNORMAL LOW (ref 12.0–15.0)
MCH: 27.1 pg (ref 26.0–34.0)
MCHC: 30.6 g/dL (ref 30.0–36.0)
MCV: 88.7 fL (ref 80.0–100.0)
Platelets: 198 10*3/uL (ref 150–400)
RBC: 3.98 MIL/uL (ref 3.87–5.11)
RDW: 17.2 % — ABNORMAL HIGH (ref 11.5–15.5)
WBC: 3.2 10*3/uL — ABNORMAL LOW (ref 4.0–10.5)
nRBC: 0 % (ref 0.0–0.2)

## 2018-10-11 LAB — GLUCOSE, CAPILLARY
GLUCOSE-CAPILLARY: 125 mg/dL — AB (ref 70–99)
Glucose-Capillary: 120 mg/dL — ABNORMAL HIGH (ref 70–99)
Glucose-Capillary: 119 mg/dL — ABNORMAL HIGH (ref 70–99)
Glucose-Capillary: 101 mg/dL — ABNORMAL HIGH (ref 70–99)

## 2018-10-11 LAB — HEPARIN LEVEL (UNFRACTIONATED): Heparin Unfractionated: 0.54 IU/mL (ref 0.30–0.70)

## 2018-10-11 LAB — APTT: aPTT: 78 seconds — ABNORMAL HIGH (ref 24–36)

## 2018-10-11 NOTE — Discharge Summary (Signed)
Physician Discharge Summary  Theresa Howe ZOX:[id number] DOB: 05/21/1931 DOA: 10/05/2018  PCP: [name], MD  Admit date: 10/05/2018 Discharge date: 10/11/2018  Admitted From: Home Disposition:  Home  Recommendations for Outpatient Follow-up:  1. Follow up with PCP in 1-2 weeks 2. Please obtain BMP/CBC in one week 3. Please only take a clear liquid diet.  Please follow your G-tube feeding regimen.  Home Health: Yes, home health PT and RN Equipment/Devices: G-tube  Discharge Condition: Stable CODE STATUS: Full Diet recommendation: Clear liquid diet by mouth only, G-tube feed 45 mL's an hour of vital 1.5 kcal over 24 hours, Ensure supplementation  Brief/Interim Summary:  #) Recurrent small bowel obstruction in the setting of peritoneal carcinomatosis treated neuroendocrine tumor: Patient was admitted with abdominal pain and concern for bowel obstruction.  Patient has had to pop prior surgery for small bowel obstruction.  It was decided to try to manage the patient nonoperatively.  She was initially kept n.p.o. and then advance to full liquid diet by mouth.  Her G-tube feeds were then restarted and gradually increased to 45 mL's an hour continuously of the vital 1.5 kcal.  She tolerated this well over 24 hours.  She should not take any more solid foods only liquids by mouth.  #) Failure to thrive: Patient was started on Ensure supplementation  #) History of DVT: Patient's home apixaban was held due to concern for possible need for bowel surgery.  She was kept on heparin drip.  She may restart her home apixaban prior to G-tube.  #) Type 2 diabetes: Patient's home detemir 4 units was held.  She was maintained on sliding scale insulin every 4 hours.  She may restart her home detemir on discharge.  Discharge Diagnoses:  Active Problems:   Diabetes with neurologic complications (HCC)   Partial small bowel obstruction (HCC)   History of DVT (deep vein thrombosis)    Hypomagnesemia   Hypokalemia    Discharge Instructions  Discharge Instructions    Call MD for:  difficulty breathing, headache or visual disturbances   Complete by:  As directed    Call MD for:  hives   Complete by:  As directed    Call MD for:  persistant dizziness or light-headedness   Complete by:  As directed    Call MD for:  persistant nausea and vomiting   Complete by:  As directed    Call MD for:  redness, tenderness, or signs of infection (pain, swelling, redness, odor or green/yellow discharge around incision site)   Complete by:  As directed    Call MD for:  severe uncontrolled pain   Complete by:  As directed    Call MD for:  temperature >100.4   Complete by:  As directed    Diet - low sodium heart healthy   Complete by:  As directed    Discharge instructions   Complete by:  As directed    Please only drink a clear liquid diet.  Please continue G-tube feeds.   Increase activity slowly   Complete by:  As directed      Allergies as of 10/11/2018   No Known Allergies     Medication List    TAKE these medications   acetaminophen 325 MG tablet Commonly known as:  TYLENOL Take 650 mg by mouth every 6 (six) hours as needed for mild pain, moderate pain or headache.   apixaban 2.5 MG Tabs tablet Commonly known as:  ELIQUIS Take 2.5 mg by  mouth 2 (two) times daily.   Blood Glucose System Pak Kit Please dispense based on patient and insurance preference. Use as directed to monitor FSBS 1x daily. Dx: E11.9.   BLOOD GLUCOSE TEST STRIPS Strp Please dispense based on patient and insurance preference. Use as directed to monitor FSBS 1x daily. Dx: E11.9.   insulin detemir 100 UNIT/ML injection Commonly known as:  LEVEMIR Inject 4 Units into the skin at bedtime.   Insulin Pen Needle 32G X 6 MM Misc Use with insulin pen ICD10:e11.43   Lancets Misc Please dispense based on patient and insurance preference. Use as directed to monitor FSBS 1x daily. Dx: E11.9.    magnesium oxide 400 MG tablet Commonly known as:  MAG-OX Take 1 tablet (400 mg total) by mouth daily.   Metoclopramide HCl 5 MG Tbdp Take 5 mg by mouth 3 (three) times daily.   ondansetron 4 MG tablet Commonly known as:  ZOFRAN Take 4 mg by mouth every 8 (eight) hours as needed for nausea or vomiting.   pantoprazole 40 MG tablet Commonly known as:  PROTONIX Take 40 mg by mouth daily.            Durable Medical Equipment  (From admission, onward)         Start     Ordered   10/10/18 1303  For home use only DME Tube feeding  Once    Comments:  TF vital,supplies.   10/10/18 1303         Follow-up Information    [facility] - Dme Follow up.   Why:  TF formula,supplies Contact information: [address] [place] [address] [phone]        Health, [facility]-Home Follow up.   Specialty:  Home Health Services Why:  [facility] nursing/physical therapy,occupational therapy Contact information: [address] [place] [address] [phone]          No Known Allergies  Consultations:  General surgery   Procedures/Studies: Ct Abdomen Pelvis W Contrast  Result Date: 10/05/2018 CLINICAL DATA:  Recent small bowel obstruction. Recurrent abdominal pain. Vomiting after feeding through PEG tube. EXAM: CT ABDOMEN AND PELVIS WITH CONTRAST TECHNIQUE: Multidetector CT imaging of the abdomen and pelvis was performed using the standard protocol following bolus administration of intravenous contrast. CONTRAST:  15mL ISOVUE-300 IOPAMIDOL (ISOVUE-300) INJECTION 61%, 85mL ISOVUE-300 IOPAMIDOL (ISOVUE-300) INJECTION 61% COMPARISON:  One view abdomen 09/27/2018. CT of the abdomen and pelvis 09/25/2018. FINDINGS: Lower chest: Lung bases are clear part from mild bibasilar atelectasis. There is no nodule or mass lesion. Heart size is normal. No significant pleural or pericardial effusion. Hepatobiliary: Mild intra and extrahepatic biliary dilation  is stable. No obstructing lesion is present. No focal hepatic lesions are present. Gallbladder is mildly distended. Pancreas: Unremarkable. Spleen: Within normal limits. Adrenals/Urinary Tract: Adrenal glands are normal bilaterally. Kidneys and ureters are within normal limits. The urinary bladder is normal. Stomach/Bowel: Stomach is mildly distended. Duodenum is unremarkable. Multiple proximal loops of small bowel are distended. There is a transition point in the left lower quadrant. There is no contrast in the colon scratched at there is minimal contrast into the colon. No free air is present. There is some degree of free fluid. Moderate stool is present in the distal sigmoid and rectum. Vascular/Lymphatic: Minimal atherosclerotic changes are present in the aorta without aneurysm. Reproductive: Status post hysterectomy. No adnexal masses. Other: No abdominal wall hernia is evident. Musculoskeletal: Levoconvex curvature is present lumbar spine. Asymmetric sclerotic  changes are present on the right at L2-3 and L3-4. Sclerotic changes are present on the left at L5-S1. Degenerative changes are present in the SI joints bilaterally, right greater than left. Multilevel facet degenerative changes are noted. The hips are located and within normal limits bilaterally. IMPRESSION: 1. Small bowel obstruction with transition point in the left lower quadrant. Minimal contrast is seen into the colon. 2. Moderate stool in the distal sigmoid colon and rectum without distal obstruction. 3. A small amount of free fluid is likely reactive. 4. Stable intra and extrahepatic biliary dilation. 5.  Aortic Atherosclerosis (ICD10-I70.0). Electronically Signed   By: Marin Roberts M.D.   On: 10/05/2018 12:53   Ct Abdomen Pelvis W Contrast  Result Date: 09/25/2018 CLINICAL DATA:  Vomiting today after tube feeding. EXAM: CT ABDOMEN AND PELVIS WITH CONTRAST TECHNIQUE: Multidetector CT imaging of the abdomen and pelvis was performed  using the standard protocol following bolus administration of intravenous contrast. CONTRAST:  ISOVUE-300 IOPAMIDOL (ISOVUE-300) INJECTION 61% COMPARISON:  01/26/2018 FINDINGS: Lower chest: Mild dependent changes in the lung bases. Hepatobiliary: No focal liver abnormality is seen. No gallstones, gallbladder wall thickening, or biliary dilatation. Pancreas: Unremarkable. No pancreatic ductal dilatation or surrounding inflammatory changes. Spleen: Normal in size without focal abnormality. Adrenals/Urinary Tract: Adrenal glands are unremarkable. Kidneys are normal, without renal calculi, focal lesion, or hydronephrosis. Bladder is unremarkable. Stomach/Bowel: Gastrostomy tube with balloon in the stomach. Stomach is not abnormally distended and no wall thickening is appreciated. There is prominent fluid distention of proximal small bowel with evidence of fold thickening. There is a transition zone to decompressed small bowel in the right mid abdomen. No obstructing mass is identified. The colon is decompressed with scattered stool present. Changes are consistent with small bowel obstruction. Fold thickening may indicate coexisting or related enteritis. No pneumatosis or portal venous gas to suggest ischemia. Mesenteric vessels appear patent. Vascular/Lymphatic: Aortic atherosclerosis. No enlarged abdominal or pelvic lymph nodes. Reproductive: Status post hysterectomy. No adnexal masses. Vaginal pessary is present. Other: Small amount of free fluid in the abdomen and pelvis, likely ascites. No free air. Abdominal wall musculature appears intact. Musculoskeletal: Prominent degenerative changes in the spine. Mild lumbar scoliosis convex towards the left. No destructive bone lesions. IMPRESSION: 1. Small bowel obstruction with transition zone in the right mid abdomen. No obstructing mass is identified. Small bowel fold thickening suggesting associated enteritis. 2. Small amount of free fluid in the abdomen and  pelvis, likely ascites. No free air. 3. Vaginal pessary. Aortic Atherosclerosis (ICD10-I70.0). Electronically Signed   By: Burman Nieves M.D.   On: 09/25/2018 21:22   Ir Cm Inj Any Colonic Tube W/fluoro  Result Date: 09/12/2018 INDICATION: Poorly functioning gastrostomy tube. EXAM: FLUOROSCOPIC GUIDED REPLACEMENT OF GASTROSTOMY TUBE COMPARISON:  Post gastrostomy tube placement-04/11/2018 MEDICATIONS: None. CONTRAST:  10 cc Isovue-300 - administered into the gastric lumen FLUOROSCOPY TIME:  12 seconds (0.3 mGy) COMPLICATIONS: None immediate. PROCEDURE: Patient was placed supine on the fluoroscopy table. Contrast injection confirms appropriate position functionality of the gastrostomy tube without evidence of leak. The skin surrounding the entrance site of the gastrostomy tube was noted to be mildly erythematous and slightly bleeding was treated with a silver nitrate stick. A dressing was placed. The patient tolerated the procedure well without immediate postprocedural complication. IMPRESSION: Appropriately positioned and functioning pull-through gastrostomy tube. No exchange performed. The patient the patient's niece was educated in regards to dressing changes associated with the gastrostomy tube. Electronically Signed   By: Holland Commons.D.  On: 09/12/2018 10:58   Dg Abd Portable 1v-small Bowel Obstruction Protocol-24 Hr Delay  Result Date: 10/06/2018 CLINICAL DATA:  Small bowel follow-through, 8 hour delay. Small-bowel obstruction EXAM: PORTABLE ABDOMEN - 1 VIEW COMPARISON:  10/06/2018 FINDINGS: Gastrostomy tube projects over the upper abdomen. Dilated small bowel loops again noted within the abdomen and pelvis. Oral contrast material is seen within the small bowel loops and as well as decompressed large bowel. Overall, bowel-gas pattern appears to have worsened since prior KUB concerning for worsening small bowel obstruction. IMPRESSION: Increasing small bowel distention since prior KUB concerning  for worsening small bowel obstruction. This appears to be partial as contrast and gas are noted within the large bowel. Electronically Signed   By: Charlett Nose M.D.   On: 10/06/2018 19:36   Dg Abd Portable 1v-small Bowel Obstruction Protocol-initial, 8 Hr Delay  Result Date: 10/06/2018 CLINICAL DATA:  84 year old female with a history of follow-up small-bowel obstruction EXAM: PORTABLE ABDOMEN - 1 VIEW COMPARISON:  Plain film 09/27/2018, CT 10/05/2018 FINDINGS: Enteric contrast has traversed the length of the colon, reaching the distal sigmoid colon and rectum. Gas within stomach and small bowel with no significant distension. Gastric tube projects over the upper abdomen. No displaced fracture. Degenerative changes of the thoracolumbar spine. IMPRESSION: Plain film demonstrates evidence of resolving small bowel obstruction, with no distention of small bowel and contrast within the colon. Electronically Signed   By: Gilmer Mor D.O.   On: 10/06/2018 11:07   Dg Abd Portable 1v-small Bowel Obstruction Protocol-initial, 8 Hr Delay  Result Date: 09/27/2018 CLINICAL DATA:  8 hour film post Gastrografin administration. EXAM: PORTABLE ABDOMEN - 1 VIEW COMPARISON:  CT abdomen dated 09/25/2018. FINDINGS: There is contrast within the colon excluding complete small bowel obstruction. There is no obvious small bowel dilatation on today's plain film. No evidence of free intraperitoneal air. Gastrostomy tube overlies the midline. IMPRESSION: Contrast is seen within the colon, thereby excluding a complete small-bowel obstruction. Electronically Signed   By: Bary Richard M.D.   On: 09/27/2018 20:57     Subjective:   Discharge Exam: Vitals:   10/10/18 2243 10/11/18 0657  BP: 114/68 101/63  Pulse: 80 69  Resp: 18 16  Temp: 98.6 F (37 C) 98.2 F (36.8 C)  SpO2: 98% 99%   Vitals:   10/10/18 1413 10/10/18 2243 10/11/18 0500 10/11/18 0657  BP: 113/62 114/68  101/63  Pulse: 75 80  69  Resp: 18 18  16    Temp: 97.9 F (36.6 C) 98.6 F (37 C)  98.2 F (36.8 C)  TempSrc: Oral Oral  Oral  SpO2: 97% 98%  99%  Weight:   42.3 kg 42.3 kg  Height:       General exam: Appears calm and comfortable  Respiratory system: Clear to auscultation. Respiratory effort normal. Cardiovascular system: Regular rate and rhythm, no murmurs Gastrointestinal system: Soft, mildly distended, diminished bowel sounds, no rebound or guarding. Central nervous system: Grossly intact, moving all extremities Extremities: No lower extremity edema. Skin: PEG tube site is clean dry and intact Psychiatry:  Judgment and insight appear to be intact  The results of significant diagnostics from this hospitalization (including imaging, microbiology, ancillary and laboratory) are listed below for reference.     Microbiology: Recent Results (from the past 240 hour(s))  Culture, Urine     Status: Abnormal   Collection Time: 10/07/18 12:12 AM  Result Value Ref Range Status   Specimen Description   Final    URINE, RANDOM  Performed at [facility], [address], [place], [place] [address]    Special Requests   Final    NONE Performed at [facility], [address] [place], [place] [address]    Culture (A)  Final    >=100,000 COLONIES/mL DIPHTHEROIDS(CORYNEBACTERIUM SPECIES) 50,000 COLONIES/mL PROTEUS MIRABILIS ORGANISM 1 Standardized susceptibility testing for this organism is not available. Performed at [facility], [address], [place], [place] [address]    Report Status 10/10/2018 FINAL  Final   Organism ID, Bacteria PROTEUS MIRABILIS (A)  Final      Susceptibility   Proteus mirabilis - MIC*    AMPICILLIN <=2 SENSITIVE Sensitive     CEFAZOLIN <=4 SENSITIVE Sensitive     CEFTRIAXONE <=1 SENSITIVE Sensitive     CIPROFLOXACIN <=0.25 SENSITIVE Sensitive     GENTAMICIN <=1 SENSITIVE Sensitive     IMIPENEM 2 SENSITIVE Sensitive     NITROFURANTOIN 128 RESISTANT  Resistant     TRIMETH/SULFA <=20 SENSITIVE Sensitive     AMPICILLIN/SULBACTAM <=2 SENSITIVE Sensitive     PIP/TAZO <=4 SENSITIVE Sensitive     * 50,000 COLONIES/mL PROTEUS MIRABILIS     Labs: BNP (last 3 results) No results for input(s): BNP in the last 8760 hours. Basic Metabolic Panel: Recent Labs  Lab 10/06/18 0413 10/07/18 0434 10/08/18 0951 10/08/18 1632 10/09/18 0431 10/09/18 1704 10/10/18 0611  NA 139 137 139  --  139  --  140  K 3.3* 3.5 4.1  --  3.4*  --  3.9  CL 108 109 108  --  107  --  109  CO2 27 23 25   --  24  --  26  GLUCOSE 105* 90 101*  --  112*  --  98  BUN 11 8 5*  --  7*  --  10  CREATININE 0.74 0.71 0.83  --  0.63  --  0.75  CALCIUM 8.1* 8.4* 8.7*  --  8.5*  --  9.0  MG 1.5* 2.2 1.5* 1.6* 1.8 1.6*  --   PHOS  --   --  3.0 2.7 2.6 2.6  --    Liver Function Tests: Recent Labs  Lab 10/05/18 1002 10/06/18 0413 10/10/18 0611  AST 30 20 25   ALT 23 18 29   ALKPHOS 60 50 55  BILITOT 1.0 0.9 0.7  PROT 6.6 5.3* 6.3*  ALBUMIN 3.3* 2.9* 3.1*   Recent Labs  Lab 10/05/18 1002  LIPASE 43   No results for input(s): AMMONIA in the last 168 hours. CBC: Recent Labs  Lab 10/05/18 1002  10/07/18 0434 10/08/18 0951 10/09/18 1454 10/10/18 0611 10/11/18 0351  WBC 3.0*   < > 2.6* 3.7* 2.6* 3.0* 3.2*  NEUTROABS 1.7  --   --   --   --   --   --   HGB 12.6   < > 10.5* 12.0 11.6* 11.4* 10.8*  HCT 40.3   < > 33.9* 39.1 37.7 36.5 35.3*  MCV 86.7   < > 89.2 88.9 87.7 87.3 88.7  PLT 241   < > 207 230 245 244 198   < > = values in this interval not displayed.   Cardiac Enzymes: No results for input(s): CKTOTAL, CKMB, CKMBINDEX, TROPONINI in the last 168 hours. BNP: Invalid input(s): POCBNP CBG: Recent Labs  Lab 10/10/18 1944 10/10/18 2246 10/11/18 0518 10/11/18 0816 10/11/18 1133  GLUCAP 90 123* 120* 119* 101*   D-Dimer No results for input(s): DDIMER in  the last 72 hours. Hgb A1c No results for input(s): HGBA1C in the last 72 hours. Lipid  Profile No results for input(s): CHOL, HDL, LDLCALC, TRIG, CHOLHDL, LDLDIRECT in the last 72 hours. Thyroid function studies No results for input(s): TSH, T4TOTAL, T3FREE, THYROIDAB in the last 72 hours.  Invalid input(s): FREET3 Anemia work up No results for input(s): VITAMINB12, FOLATE, FERRITIN, TIBC, IRON, RETICCTPCT in the last 72 hours. Urinalysis    Component Value Date/Time   COLORURINE YELLOW 10/05/2018 1330   APPEARANCEUR CLOUDY (A) 10/05/2018 1330   LABSPEC <1.005 (L) 10/05/2018 1330   PHURINE 7.0 10/05/2018 1330   GLUCOSEU NEGATIVE 10/05/2018 1330   HGBUR SMALL (A) 10/05/2018 1330   BILIRUBINUR NEGATIVE 10/05/2018 1330   KETONESUR NEGATIVE 10/05/2018 1330   PROTEINUR 30 (A) 10/05/2018 1330   NITRITE NEGATIVE 10/05/2018 1330   LEUKOCYTESUR LARGE (A) 10/05/2018 1330   Sepsis Labs Invalid input(s): PROCALCITONIN,  WBC,  LACTICIDVEN Microbiology Recent Results (from the past 240 hour(s))  Culture, Urine     Status: Abnormal   Collection Time: 10/07/18 12:12 AM  Result Value Ref Range Status   Specimen Description   Final    URINE, RANDOM Performed at [facility], [address] [place], [place] [address]    Special Requests   Final    NONE Performed at [facility], [address] [place], [place] [address]    Culture (A)  Final    >=100,000 COLONIES/mL DIPHTHEROIDS(CORYNEBACTERIUM SPECIES) 50,000 COLONIES/mL PROTEUS MIRABILIS ORGANISM 1 Standardized susceptibility testing for this organism is not available. Performed at [facility], [address] [place], [place] [address]    Report Status 10/10/2018 FINAL  Final   Organism ID, Bacteria PROTEUS MIRABILIS (A)  Final      Susceptibility   Proteus mirabilis - MIC*    AMPICILLIN <=2 SENSITIVE Sensitive     CEFAZOLIN <=4 SENSITIVE Sensitive     CEFTRIAXONE <=1 SENSITIVE Sensitive     CIPROFLOXACIN <=0.25 SENSITIVE Sensitive     GENTAMICIN <=1 SENSITIVE Sensitive      IMIPENEM 2 SENSITIVE Sensitive     NITROFURANTOIN 128 RESISTANT Resistant     TRIMETH/SULFA <=20 SENSITIVE Sensitive     AMPICILLIN/SULBACTAM <=2 SENSITIVE Sensitive     PIP/TAZO <=4 SENSITIVE Sensitive     * 50,000 COLONIES/mL PROTEUS MIRABILIS     Time coordinating discharge: 35  SIGNED:   Delaine Lame, MD  [facility] 10/11/2018, 12:30 PM  If 7PM-7AM, please contact night-coverage [email or web address] Password [email or web address]

## 2018-10-11 NOTE — Progress Notes (Signed)
PT Cancellation Note  Patient Details Name: Theresa Howe MRN: [id number] DOB: 08-12-1931   Cancelled Treatment:    Reason Eval/Treat Not Completed: Fatigue/lethargy limiting ability to participate;Patient declined, no reason specified: Pt deferring PT today because pt states "I'm leaving today" and "I'm tired from going to and from the bathroom this morning". PT to follow up with pt as schedule allows.   Julien Girt, PT Acute Rehabilitation Services Pager [phone]  Office [phone]    Roxine Caddy D Elonda Husky 10/11/2018, 12:30 PM

## 2018-10-11 NOTE — Discharge Instructions (Signed)
Feeding Tube Use Some people who have trouble swallowing or cannot take food or medicine by mouth may need a feeding tube. A feeding tube can go into the nose and down to the stomach or small bowel, or through the skin in the abdomen and into the stomach or small bowel. Liquid food (formula), breast milk, or medicines may be given through the tube. Supplies needed for a tube feeding:  Clean gloves.  Prescribed formula or breast milk.  Appropriate feeding bag set, gravity drip tubing set, or 30-60-mL syringe with feeding extension tubing.  Feeding tube pump or syringe pump as needed.  Pole as needed.  20-60-mL syringe to check tube placement.  A syringe to flush the feeding tube.  Container.  Water. How to give a feeding through a feeding tube pump 1. Have all supplies ready and available. 2. Wash hands well. 3. Put on clean gloves. 4. Check the placement of the feeding tube as directed. 5. Raise the head of the person 30-45 degrees or as directed. 6. Pour up to 4 hours of room-temperature feeding into the feeding bag set. 7. Hang the feeding bag set from a pole. 8. Flush the entire feeding bag set with the feeding. 9. Load the feeding bag set into the feeding tube pump. 10. Cap the feeding bag set. 11. Uncap the feeding tube. 12. Using a syringe, flush the feeding tube with water as directed. 13. Uncap the feeding bag set. 14. Connect the feeding bag set to the feeding tube. 15. Remove gloves. 16. Wash hands well. 17. Set the prescribed feeding rate. 18. Start the feeding tube pump. How to give a feeding through a syringe pump 1. Have all supplies ready and available. 2. Wash hands well. 3. Put on clean gloves. 4. Check the placement of the feeding tube as directed. 5. Raise the head of the person 30-45 degrees or as directed. 6. Draw up to 4 hours of room-temperature formula into the correctly sized syringe. 7. Attach the syringe to the feeding extension  tubing. 8. Flush the entire feeding extension tubing with the feeding. 9. Load the syringe and feeding extension tubing into the syringe pump. 10. Cap the feeding extension tubing. 11. Uncap the feeding tube. 12. Using a syringe, flush the feeding tube with water as directed. 13. Uncap the feeding extension tubing. 14. Connect the feeding extension tubing to the feeding tube. 15. Remove gloves. 16. Wash hands well. 17. Set the prescribed feeding rate. 18. Start the syringe pump. How to give a feeding using the gravity method 1. Have all supplies ready and available. 2. Wash hands well. 3. Put on clean gloves. 4. Check the placement of the feeding tube as directed. 5. Raise the head of the person 30-45 degrees or as directed. 6. Close the clamp on the gravity drip tubing set. 7. Pour up to 4 hours of room-temperature feeding into the bag of a gravity drip tubing set. Or, if using a ready-to-hang formula container, connect the gravity drip tubing set to the ready-to-hang container. 8. Hang the feeding with the attached gravity drip tubing set from a pole. 9. Open the roller clamp on the gravity drip tubing to fill the entire tubing with the feeding. 10. Close the roller clamp. 11. Cap the gravity drip tubing. 12. Uncap the feeding tube. 13. Using a syringe, flush the feeding tube with water as directed. 14. Uncap the gravity drip tubing. 15. Connect the gravity drip tubing to the feeding tube. 16. Using the roller  clamp, adjust the feeding rate to deliver the feeding at the prescribed rate. 17. Remove gloves. 18. Wash hands well. How to give a bolus feeding through a feeding tube 1. Remove the plunger from the syringe. 2. Attach the syringe to the feeding tube. 3. Pour the feeding into the syringe. 4. Administer the feeding at the prescribed rate by means of gravity. A feeding bag may also be used for bolus feedings, depending on the volume of feeding given. Supplies needed for  giving medicines through a feeding tube  60-mL syringe.  Container.  Water.  Medicine.  Pill crusher, if medicine is in tablet form.  Clean gloves. How to give medicines through a feeding tube 1. Have all supplies ready and available. 2. Wash hands well. 3. If the medicine cannot be given with the feeding, stop the feeding 60 minutes before giving the medicine. 4. If the person needs to take medicine on an empty stomach, consider not giving a feeding for up to 2 hours (hold time) before giving medicine. Talk to the health care provider about the correct hold times. 5. Fill a container with 50-100 mL of warm water. 6. Prepare medicine that will go into the feeding tube: ? Liquid--Measure the prescribed medicine dose. ? Tablet--Crush the tablet using a pill-crushing device. Grind the pill to a fine powder. Dissolve the powder in at least 30 mL of warm water or as directed. If there is more than 1 tablet, crush and dilute each one individually. ? Capsule--Open a capsule containing dry pellets or pierce a capsule containing liquid (gelcap). Empty the contents in 30 mL of warm water or as directed. Gelcaps can also be dissolved in warm water. 7. Raise the head of the person 30-45 degrees or as directed. 8. Wash hands well. 9. Put on clean gloves. 10. If a continuous tube feeding is infusing, stop the tube feeding. 11. If applicable, close the tubing clamp. 12. Disconnect the feeding bag set, or the gravity drip tubing set, from the feeding tube. 13. Cap the feeding bag set or gravity drip tubing set. 14. Check the placement of the feeding tube as directed. 15. Draw up 30 mL of water into a syringe or as directed. 16. Insert the tip of the syringe into the feeding tube. 17. Using the syringe, flush the feeding tube with water. 18. Remove the plunger of the syringe or replace the syringe with a syringe that contains medicine. 19. Give the medicine as directed. ? If giving only 1 dose of  medicine, flush the feeding tube with water after giving the medicine. ? If giving more than 1 dose of medicine, give each separately. Flush the feeding tube between medicines and after the last dose of medicine. 20. If a tube feeding will not be continued after the medicine, cap the feeding tube. Position the person to a comfortable position, but keep his or her head raised for 1 hour after giving the medicine. 21. If a tube feeding will be continued after the medicine, but the medicine cannot be given with the feeding, continue to hold the feeding for 30-60 minutes after the medicine is given. When appropriate, reconnect the feeding bag set, or gravity drip tubing set, to the feeding tube. 73. Throw away or clean used supplies as directed. 23. Remove gloves. 24. Wash hands well. This information is not intended to replace advice given to you by your health care provider. Make sure you discuss any questions you have with your health care provider. Document  Released: 11/20/2012 Document Revised: 08/09/2016 Document Reviewed: 07/18/2012 Elsevier Interactive Patient Education  2017 [employer].

## 2018-10-11 NOTE — Telephone Encounter (Signed)
-----   Message from Alycia Rossetti, MD sent at 10/11/2018  3:30 PM EDT ----- Regarding:  Pt needs hospital F/U 1-2 weeks, please schedule with Dr. Dennard Schaumann Recurrent SBO, Carcinoid tumor - will see Oncology/surgery- this is not new FTT- ENSURE Diabetes 4 units Levemir Liquids ONLY diet  Needs repeat labs CBC/CMET, Face to face since she has so many [facility] orders coming through

## 2018-10-11 NOTE — Telephone Encounter (Signed)
Call placed to patient niece, [name]. [place].

## 2018-10-11 NOTE — Progress Notes (Signed)
Discharge instructions reviewed with patient and nephew and wife. Education given about tube feeds and for the patient to only have clear liquids. They verbalized understanding. They stated home health had been set up and the pump had already been delivered for home use. Patient wheeled to vehicle with belongings by nurse tech

## 2018-10-11 NOTE — Progress Notes (Signed)
ANTICOAGULATION CONSULT NOTE - Follow Up Consult  Pharmacy Consult for heparin Indication: hx DVT (home Eliquis on hold)  No Known Allergies  Patient Measurements: Height: 5\' 6"  (167.6 cm) Weight: 93 lb 4.8 oz (42.3 kg) IBW/kg (Calculated) : 59.3 Heparin Dosing Weight: 43 kg  Vital Signs: Temp: 98.2 F (36.8 C) (10/25 0300) Temp Source: Oral (10/25 0300) BP: 101/63 (10/25 0300) Pulse Rate: 69 (10/25 0300)  Labs: Recent Labs    10/08/18 0951 10/09/18 0431  10/09/18 1454 10/10/18 0611 10/10/18 1549 10/11/18 0351  HGB 12.0  --   --  11.6* 11.4*  --  10.8*  HCT 39.1  --   --  37.7 36.5  --  35.3*  PLT 230  --   --  245 244  --  198  APTT  --   --    < > 33 75* 81* 78*  LABPROT  --   --   --  14.7  --   --   --   INR  --   --   --  1.16  --   --   --   HEPARINUNFRC  --   --   --  1.98* 1.02*  --  0.54  CREATININE 0.83 0.63  --   --  0.75  --   --    < > = values in this interval not displayed.    Estimated Creatinine Clearance: 34.3 mL/min (by C-G formula based on SCr of 0.75 mg/dL).   Medications:  - on Eliquis 2.5 mg bid PTA  Assessment: Patient is an 84 y.o F on with hx DVT on Eliquis PTA, peritoneal carcinomatosis, and recently hospitalized for SBO presented to the ED on 10/05/18 with c/o n/v and abd pain.  Abd CT on 10/19 showed SBO. Anticoagulation converted to heparin drip on 10/23 in case surgical intervention is needed.  Today, 10/11/2018: - aPTT and HL appear to correlate today with both in goal range - CBC stable - no bleeding reported   Goal of Therapy:  Heparin level 0.3-0.7 units/ml Monitor platelets by anticoagulation protocol: Yes   Plan:  - Continue heparin infusion at 700 units/hr.  - Henceforth monitoring with HL - Daily HL, CBC  Ulice Dash, PharmD Clinical Pharmacist Pager # [phone]  10/11/2018 6:54 AM

## 2018-10-11 NOTE — Progress Notes (Signed)
Nutrition Brief Note  RD paged by Friend regarding at home TF recommendations. RD previously provided bolus feeding recommendations at home but after further discussion with MD will recommend cyclic feedings.   Recommend TF regimen of:   -Vital 1.5 @ 60 ml/hr x 18 hours (6 pm to 12pm) via G-tube -Free water flushes of 200 ml Q6 hours   This provides 1620 kcal, 73 grams protein, and 821 ml free water (1621 ml with flushes). Meets 100% of needs.  Advanced to manage as outpatient.   Mariana Single RD, LDN Clinical Nutrition Pager # [phone]

## 2018-10-12 DIAGNOSIS — Z431 Encounter for attention to gastrostomy: Secondary | ICD-10-CM | POA: Diagnosis not present

## 2018-10-12 DIAGNOSIS — E43 Unspecified severe protein-calorie malnutrition: Secondary | ICD-10-CM | POA: Diagnosis not present

## 2018-10-12 DIAGNOSIS — C786 Secondary malignant neoplasm of retroperitoneum and peritoneum: Secondary | ICD-10-CM | POA: Diagnosis not present

## 2018-10-12 DIAGNOSIS — C7A8 Other malignant neuroendocrine tumors: Secondary | ICD-10-CM | POA: Diagnosis not present

## 2018-10-12 DIAGNOSIS — E1165 Type 2 diabetes mellitus with hyperglycemia: Secondary | ICD-10-CM | POA: Diagnosis not present

## 2018-10-12 DIAGNOSIS — I82502 Chronic embolism and thrombosis of unspecified deep veins of left lower extremity: Secondary | ICD-10-CM | POA: Diagnosis not present

## 2018-10-14 ENCOUNTER — Other Ambulatory Visit: Payer: Self-pay | Admitting: Hematology

## 2018-10-14 ENCOUNTER — Telehealth: Payer: Self-pay | Admitting: *Deleted

## 2018-10-14 DIAGNOSIS — C7A8 Other malignant neuroendocrine tumors: Secondary | ICD-10-CM

## 2018-10-14 DIAGNOSIS — D649 Anemia, unspecified: Secondary | ICD-10-CM

## 2018-10-14 NOTE — Progress Notes (Signed)
[place] CONSULT NOTE  Patient Care Team: [place], Theresa Nunnery, MD as PCP - General (Family Medicine)  HEME/ONC OVERVIEW: 1. 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 Barry Dienes; path showed low-grade neuroendocrine tumor of the small bowel involving LN's and omentum (pT4N2), focal positive margin -12/2017: repeat diagnostic laparoscopy/laparotomy with small bowel resection; path showed low grade neuroendocrine tumor, margin not commented  -09/2018: admitted for small bowel obstruction but no mass identified; resolved with NG tube   2. Hx of DVT on Eliquis  3. 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 occult peritoneal carcinomatosis -Overall, there is no overt organ metastases on CT -Prior to initiating any treatment, dotatate PET is important to better identify any occult metastases; I have ordered the scan today -Serologic studies, including chromogranin and serotonin levels, are pending -If dotatate PET does not demonstrate significant tumor burden or disease threatening organ function, there is no urgent indication to start treatment, and the patient can be monitored with scans q3-2month  Small bowel obstruction -Since discharge, patient has been on continuous tube feed without any recurrent nausea,  vomiting or abdominal pain -She is currently drinking fluids only and instructed not to eat any solid food at discharge -Per her family, the patient has had several normal swallow studies -Obtain upper GI series with small bowel follow-through -If the upper GI series shows normal motility, then I do not see any reason that would preclude her from eating by mouth   Hx of DVT -On Eliquis -No symptom or sign of bleeding or excess bruising -Continue Eliquis for now   Recent normocytic anemia -Hgb had been borderline low in the past, likely multifactorial, including blood loss from surgery, anemia of chronic disease -Hgb normal today  -Iron profile pending  Early dementia -Patient was diagnosed with early dementia in January 2019 -She is currently requiring sitter during the day for ADLs  Goals of care discussion -I discussed with the patient and her family in detail regarding diagnosis, treatment options, natural history and prognosis of the disease -I informed them that the disease is not curable, but relatively indolent based on recent pathology and treatable -Furthermore, if dotatate PET does not show any significant tumor burden or threatening organ function, she may be monitored with serial scans without having to initiate treatment right away  Orders Placed This Encounter  Procedures  . DG UGI W/SMALL BOWEL    Standing Status:   Future    Standing Expiration Date:   12/19/2019    Order Specific Question:   Reason for Exam (SYMPTOM  OR DIAGNOSIS REQUIRED)    Answer:   Recent small bowel bowel obstruction; evaluate motility    Order Specific Question:   Preferred imaging location?    Answer[facility]   Order Specific Question:   Radiology Contrast Protocol - do NOT remove file path    Answer:   \\[email or web address]\epicdata\[email or web address]s.pdf  .  NM PET (NETSPOT GA 16 DOTATATE) SKULL BASE TO MID THIGH    Standing Status:   Future    Standing Expiration  Date:   12/19/2019    Order Specific Question:   If indicated for the ordered procedure, I authorize the administration of a radiopharmaceutical per Radiology protocol    Answer:   Yes    Order Specific Question:   Preferred imaging location?    Answer:   [facility]    Order Specific Question:   Radiology Contrast Protocol - do NOT remove file path    Answer:   \\[email or web address]\[email or web address]\[email or web address]   A total of more than 60 minutes were spent face-to-face with the patient during this encounter and over half of that time was spent on counseling and coordination of care as outlined above.    All questions were answered. The patient knows to call the clinic with any problems, questions or concerns.  Return to clinic in 3 weeks to follow up imaging studies outlined above.  Theresa Men, MD 10/18/2018 2:58 PM   CHIEF COMPLAINTS/PURPOSE OF CONSULTATION:  "I am here for my intestine cancer"  HISTORY OF PRESENTING ILLNESS:  Theresa Howe 84 y.o. female is here for evaluation and management of metastatic neuroendocrine tumor of the small bowel.  Patient presented to PCP in August 2018 for unexplained weight loss, and underwent abdominal imaging showed distention of the main pancreatic duct and a small enhancing liver lesion.  After several imaging studies, she ultimately underwent diagnostic laparoscopy with partial small bowel resection in December 2018 that showed metastatic low-grade neuroendocrine tumor.  Due to positive margin, she underwent a second laparoscopy with small bowel resection in January 2019.  Since then, she has been monitor with surveillance imaging.  In October 2019, she was admitted for small bowel obstruction, and CT abdomen showed small bowel obstruction with transition point in the mid abdomen, but no evidence of metastatic neuroendocrine tumor. She was managed conservatively with resolution of GI symptoms, and was discharged home with continuous tube feed.    Since discharge, she has not had any recurrent GI symptoms, such as abdominal pain, nausea, or vomiting.  She has only been drinking fluids by mouth, but was instructed not to eat any solid food at the time of discharge.  She denies any other complaint today.  I have reviewed her chart and materials related to her cancer extensively and collaborated history with the patient. Summary of oncologic history is as follows:   Primary malignant neuroendocrine tumor of small intestine ([place])   10/03/2017 Imaging    MRI abdomen: 1. Nondiagnostic study due to motion artifact. 2. Diffuse distention of main pancreatic duct in the head and body of pancreas as seen on CT. 3. 11 mm enhancing lesion in the inferior right liver cannot be further characterized. Follow-up imaging may prove helpful to ensure stability. If the patient is able to receive iodinated intravenous contrast, CT may be a better means of follow-up due to the better temporal resolution of CT imaging.    10/17/2017 Imaging    CT pancreas protocol: IMPRESSION: 1. Subcapsular enhancing lesion in the RIGHT hepatic lobe is favored a benign vascular phenomena. Recommend attention on follow-up. 2. Dilatation of the pancreatic duct without obstructing mass identified. Cannot exclude a ampullary lesion. Consider ERCP for evaluation. 3. The small bowel and pelvic mesenteric mass are not imaged on CT abdomen. In patient with potential neuroendocrine tumor, consider a Ga 37 DOTATATE PET/CT scan which has high sensitivity and  specificity for well differentiated neuroendocrine tumors. This could also evaluate the potential ampullary lesion if of neuroendocrine origin.    12/04/2017 Surgery    Diagnostic laparoscopy with small bowel resection, partial omentectomy by Dr. Barry Dienes    12/04/2017 Pathology Results    (Accession: [id number]) 1. Small intestine, resection for tumor - LOW GRADE NEUROENDOCRINE TUMOR, 3.8 CM, EXTENDING TO SMALL BOWEL SEROSA. -  PROXIMAL AND DISTAL MARGINS FREE OF TUMOR. - FOUR CM MESENTERIC NODULE CONSISTENT WITH LYMPH NODE METASTASIS. - METASTATIC LOW GRADE NEUROENDOCRINE TUMOR IN THREE OF ELEVEN LYMPH NODES (3/11). 2. Omentum, biopsy - NODULES OF LOW GRADE NEUROENDOCRINE TUMOR INVOLVING OMENTAL ADIPOSE TISSUE.  pT4pN2    12/04/2017 Cancer Staging    Staging form: Small Intestine - Other Histologies, AJCC 8th Edition - Clinical stage from 12/04/2017: cT4, cN2, cM1 - Signed by Theresa Men, MD on 10/14/2018    12/27/2017 Surgery    REPEAT DIAGNOSTIC LAPAROSCOPY/ LAPAROTOMY WITH SMALL BOWEL RESECTION (due to positive margin)    12/27/2017 Pathology Results    (Accession: [id number]) Small intestine, resection - LOW GRADE NEUROENDOCRINE TUMOR (CARCINOID TUMOR) INVOLVING SMOOTH MUSCLE AND FIBROADIPOSE TISSUE. - BENIGN SMALL BOWEL TYPE MUCOSA.    01/16/2018 Imaging    CT abdomen/pelvis  IMPRESSION: Further continued decrease in right pelvic abscess. This appears completely drained presently, with only a small amount of density surrounding the drainage catheter.  New demonstration of 2 small hyperdense areas in the right lower quadrant adjacent to the anastomosis/drainage catheter, the larger  measuring 2 cm in diameter. The differential diagnosis is postprocedural hematoma versus rapid recurrence of tumor versus is small leak collections. These warrant additional follow-up.    09/25/2018 Imaging    CT abdomen/pelvis w/ contrast: 1. Small bowel obstruction with transition zone in the right mid abdomen. No obstructing mass is identified. Small bowel fold thickening suggesting associated enteritis. 2. Small amount of free fluid in the abdomen and pelvis, likely ascites. No free air. 3. Vaginal pessary.    10/05/2018 Imaging    CT abdomen/pelvis w/ contrast: IMPRESSION: 1. Small bowel obstruction with transition point in the left lower quadrant. Minimal contrast is seen into the colon. 2. Moderate stool in the  distal sigmoid colon and rectum without distal obstruction. 3. A small amount of free fluid is likely reactive. 4. Stable intra and extrahepatic biliary dilation. 5.  Aortic Atherosclerosis (ICD10-I70.0).     MEDICAL HISTORY:  Past Medical History:  Diagnosis Date  . Abdominal mass 10/2017   SMALL BOWEL  . Diabetes mellitus    type 2  . Dyspnea   . Failure to thrive (0-17) 12/24/2017  . Glaucoma   . Hyperlipidemia   . Hypertension   . Neuropathy   . Osteoporosis   . Wheezing     SURGICAL HISTORY: Past Surgical History:  Procedure Laterality Date  . ABDOMINAL HYSTERECTOMY    . CATARACT EXTRACTION Right 01/2010  . COLON SURGERY    . COLONOSCOPY    . EUS N/A 04/17/2013   Procedure: UPPER ENDOSCOPIC ULTRASOUND (EUS) LINEAR;  Surgeon: Milus Banister, MD;  Location: [facility];  Service: Endoscopy;  Laterality: N/A;  . GLAUCOMA SURGERY  2007  . IR CM INJ ANY COLONIC TUBE W/FLUORO  09/12/2018  . IR FLUORO GUIDE CV LINE RIGHT  01/17/2018  . IR GASTROSTOMY TUBE MOD SED  04/11/2018  . IR IMAGE GUIDED DRAINAGE PERCUT CATH  PERITONEAL RETROPERIT  01/09/2018  . IR REMOVAL TUN CV CATH W/O FL  06/19/2018  . IR SINUS/FIST  TUBE CHK-NON GI  01/18/2018  . IR US GUIDE VASC ACCESS RIGHT  01/17/2018  . LAPAROSCOPIC SMALL BOWEL RESECTION  12/04/2017  . LAPAROSCOPIC SMALL BOWEL RESECTION N/A 12/04/2017   Procedure: LAPAROSCOPIC SMALL BOWEL RESECTION;  Surgeon: Stark Klein, MD;  Location: [facility];  Service: General;  Laterality: N/A;  . LAPAROSCOPY N/A 12/04/2017   Procedure: LAPAROSCOPY DIAGNOSTIC;  Surgeon: Stark Klein, MD;  Location: [facility];  Service: General;  Laterality: N/A;  . LAPAROSCOPY N/A 12/27/2017   Procedure: DIAGNOSTIC LAPAROSCOPY/ LAPAROTOMY WITH SMALL BOWEL RESECTION TIMES TWO;  Surgeon: Stark Klein, MD;  Location: [facility];  Service: General;  Laterality: N/A;  . MULTIPLE TOOTH EXTRACTIONS    . PICC LINE INSERTION  12/24/2017    SOCIAL HISTORY: Social History   Socioeconomic  History  . Marital status: Widowed    Spouse name: Not on file  . Number of children: Not on file  . Years of education: Not on file  . Highest education level: Not on file  Occupational History  . Not on file  Social Needs  . Financial resource strain: Not on file  . Food insecurity:    Worry: Not on file    Inability: Not on file  . Transportation needs:    Medical: Not on file    Non-medical: Not on file  Tobacco Use  . Smoking status: Never Smoker  . Smokeless tobacco: Never Used  Substance and Sexual Activity  . Alcohol use: No  . Drug use: No  . Sexual activity: Not Currently  Lifestyle  . Physical activity:    Days per week: Not on file    Minutes per session: Not on file  . Stress: Not on file  Relationships  . Social connections:    Talks on phone: Not on file    Gets together: Not on file    Attends religious service: Not on file    Active member of club or organization: Not on file    Attends meetings of clubs or organizations: Not on file    Relationship status: Not on file  . Intimate partner violence:    Fear of current or ex partner: Not on file    Emotionally abused: Not on file    Physically abused: Not on file    Forced sexual activity: Not on file  Other Topics Concern  . Not on file  Social History Narrative  . Not on file    FAMILY HISTORY: History reviewed. No pertinent family history.  ALLERGIES:  has No Known Allergies.  MEDICATIONS:  Current Outpatient Medications  Medication Sig Dispense Refill  . acetaminophen (TYLENOL) 325 MG tablet Take 650 mg by mouth every 6 (six) hours as needed for mild pain, moderate pain or headache.     Marland Kitchen apixaban (ELIQUIS) 2.5 MG TABS tablet Take 2.5 mg by mouth 2 (two) times daily.    . Blood Glucose Monitoring Suppl (BLOOD GLUCOSE SYSTEM PAK) KIT Please dispense based on patient and insurance preference. Use as directed to monitor FSBS 1x daily. Dx: E11.9. 1 each 0  . Glucose Blood (BLOOD GLUCOSE TEST  STRIPS) STRP Please dispense based on patient and insurance preference. Use as directed to monitor FSBS 1x daily. Dx: E11.9. 50 each 11  . insulin detemir (LEVEMIR) 100 UNIT/ML injection Inject 4 Units into the skin at bedtime.     . Insulin Pen Needle (BD PEN NEEDLE MICRO U/F) 32G X 6 MM MISC Use with insulin pen ICD10:e11.43 100 each 1  .  Lancets MISC Please dispense based on patient and insurance preference. Use as directed to monitor FSBS 1x daily. Dx: E11.9. 50 each 11  . magnesium oxide (MAG-OX) 400 MG tablet Take 1 tablet (400 mg total) by mouth daily. 14 tablet 0  . Metoclopramide HCl 5 MG TBDP Take 5 mg by mouth 3 (three) times daily.  0  . ondansetron (ZOFRAN) 4 MG tablet Take 4 mg by mouth every 8 (eight) hours as needed for nausea or vomiting.    . pantoprazole (PROTONIX) 40 MG tablet Take 40 mg by mouth daily.     No current facility-administered medications for this visit.     REVIEW OF SYSTEMS:   Constitutional: ( - ) fevers, ( - )  chills , ( - ) night sweats Eyes: ( - ) blurriness of vision, ( - ) double vision, ( - ) watery eyes Ears, nose, mouth, throat, and face: ( - ) mucositis, ( - ) sore throat Respiratory: ( - ) cough, ( - ) dyspnea, ( - ) wheezes Cardiovascular: ( - ) palpitation, ( - ) chest discomfort, ( - ) lower extremity swelling Gastrointestinal:  ( - ) nausea, ( - ) heartburn, ( - ) change in bowel habits Skin: ( - ) abnormal skin rashes Lymphatics: ( - ) new lymphadenopathy, ( - ) easy bruising Neurological: ( - ) numbness, ( - ) tingling, ( - ) new weaknesses Behavioral/Psych: ( - ) mood change, ( - ) new changes  All other systems were reviewed with the patient and are negative.  PHYSICAL EXAMINATION: ECOG PERFORMANCE STATUS: 2 - Symptomatic, <50% confined to bed  Vitals:   10/18/18 1339 10/18/18 1342  BP: 121/60 121/60  Pulse: 80 80  Resp: 16 20  Temp: 97.6 F (36.4 C) 97.6 F (36.4 C)  SpO2: 100% 100%   Filed Weights   10/18/18 1339 10/18/18  1342  Weight: 90 lb 12 oz (41.2 kg) 90 lb 1.9 oz (40.9 kg)    GENERAL: alert, no distress and comfortable, very thin SKIN: skin color, texture, turgor are normal, no rashes or significant lesions EYES: conjunctiva are pink and non-injected, sclera clear OROPHARYNX: no exudate, no erythema; lips, buccal mucosa, and tongue normal  NECK: supple, non-tender LYMPH:  no palpable lymphadenopathy in the cervical or axillary LUNGS: clear to auscultation and percussion with normal breathing effort HEART: regular rate & rhythm, no murmurs, no lower extremity edema ABDOMEN: soft, non-tender, non-distended, normal bowel sounds Musculoskeletal: no cyanosis of digits and no clubbing  PSYCH: alert, oriented only to person, but not to date or situation NEURO: no focal motor/sensory deficits  LABORATORY DATA:  I have reviewed the data as listed Lab Results  Component Value Date   WBC 4.6 10/18/2018   HGB 12.1 10/18/2018   HCT 39.0 10/18/2018   MCV 88.6 10/18/2018   PLT 252 10/18/2018   Lab Results  Component Value Date   NA 140 10/10/2018   K 3.9 10/10/2018   CL 109 10/10/2018   CO2 26 10/10/2018    RADIOGRAPHIC STUDIES: I have personally reviewed the radiological images as listed and agreed with the findings in the report. Ct Abdomen Pelvis W Contrast  Result Date: 10/05/2018 CLINICAL DATA:  Recent small bowel obstruction. Recurrent abdominal pain. Vomiting after feeding through PEG tube. EXAM: CT ABDOMEN AND PELVIS WITH CONTRAST TECHNIQUE: Multidetector CT imaging of the abdomen and pelvis was performed using the standard protocol following bolus administration of intravenous contrast. CONTRAST:  80m ISOVUE-300 IOPAMIDOL (ISOVUE-300)  INJECTION 61%, 47m ISOVUE-300 IOPAMIDOL (ISOVUE-300) INJECTION 61% COMPARISON:  One view abdomen 09/27/2018. CT of the abdomen and pelvis 09/25/2018. FINDINGS: Lower chest: Lung bases are clear part from mild bibasilar atelectasis. There is no nodule or mass  lesion. Heart size is normal. No significant pleural or pericardial effusion. Hepatobiliary: Mild intra and extrahepatic biliary dilation is stable. No obstructing lesion is present. No focal hepatic lesions are present. Gallbladder is mildly distended. Pancreas: Unremarkable. Spleen: Within normal limits. Adrenals/Urinary Tract: Adrenal glands are normal bilaterally. Kidneys and ureters are within normal limits. The urinary bladder is normal. Stomach/Bowel: Stomach is mildly distended. Duodenum is unremarkable. Multiple proximal loops of small bowel are distended. There is a transition point in the left lower quadrant. There is no contrast in the colon scratched at there is minimal contrast into the colon. No free air is present. There is some degree of free fluid. Moderate stool is present in the distal sigmoid and rectum. Vascular/Lymphatic: Minimal atherosclerotic changes are present in the aorta without aneurysm. Reproductive: Status post hysterectomy. No adnexal masses. Other: No abdominal wall hernia is evident. Musculoskeletal: Levoconvex curvature is present lumbar spine. Asymmetric sclerotic changes are present on the right at L2-3 and L3-4. Sclerotic changes are present on the left at L5-S1. Degenerative changes are present in the SI joints bilaterally, right greater than left. Multilevel facet degenerative changes are noted. The hips are located and within normal limits bilaterally. IMPRESSION: 1. Small bowel obstruction with transition point in the left lower quadrant. Minimal contrast is seen into the colon. 2. Moderate stool in the distal sigmoid colon and rectum without distal obstruction. 3. A small amount of free fluid is likely reactive. 4. Stable intra and extrahepatic biliary dilation. 5.  Aortic Atherosclerosis (ICD10-I70.0). Electronically Signed   By: CSan MorelleM.D.   On: 10/05/2018 12:53   Ct Abdomen Pelvis W Contrast  Result Date: 09/25/2018 CLINICAL DATA:  Vomiting today  after tube feeding. EXAM: CT ABDOMEN AND PELVIS WITH CONTRAST TECHNIQUE: Multidetector CT imaging of the abdomen and pelvis was performed using the standard protocol following bolus administration of intravenous contrast. CONTRAST:  1047mISOVUE-300 IOPAMIDOL (ISOVUE-300) INJECTION 61% COMPARISON:  01/26/2018 FINDINGS: Lower chest: Mild dependent changes in the lung bases. Hepatobiliary: No focal liver abnormality is seen. No gallstones, gallbladder wall thickening, or biliary dilatation. Pancreas: Unremarkable. No pancreatic ductal dilatation or surrounding inflammatory changes. Spleen: Normal in size without focal abnormality. Adrenals/Urinary Tract: Adrenal glands are unremarkable. Kidneys are normal, without renal calculi, focal lesion, or hydronephrosis. Bladder is unremarkable. Stomach/Bowel: Gastrostomy tube with balloon in the stomach. Stomach is not abnormally distended and no wall thickening is appreciated. There is prominent fluid distention of proximal small bowel with evidence of fold thickening. There is a transition zone to decompressed small bowel in the right mid abdomen. No obstructing mass is identified. The colon is decompressed with scattered stool present. Changes are consistent with small bowel obstruction. Fold thickening may indicate coexisting or related enteritis. No pneumatosis or portal venous gas to suggest ischemia. Mesenteric vessels appear patent. Vascular/Lymphatic: Aortic atherosclerosis. No enlarged abdominal or pelvic lymph nodes. Reproductive: Status post hysterectomy. No adnexal masses. Vaginal pessary is present. Other: Small amount of free fluid in the abdomen and pelvis, likely ascites. No free air. Abdominal wall musculature appears intact. Musculoskeletal: Prominent degenerative changes in the spine. Mild lumbar scoliosis convex towards the left. No destructive bone lesions. IMPRESSION: 1. Small bowel obstruction with transition zone in the right mid abdomen. No  obstructing mass is  identified. Small bowel fold thickening suggesting associated enteritis. 2. Small amount of free fluid in the abdomen and pelvis, likely ascites. No free air. 3. Vaginal pessary. Aortic Atherosclerosis (ICD10-I70.0). Electronically Signed   By: Lucienne Capers M.D.   On: 09/25/2018 21:22   Dg Abd Portable 1v-small Bowel Obstruction Protocol-24 Hr Delay  Result Date: 10/06/2018 CLINICAL DATA:  Small bowel follow-through, 8 hour delay. Small-bowel obstruction EXAM: PORTABLE ABDOMEN - 1 VIEW COMPARISON:  10/06/2018 FINDINGS: Gastrostomy tube projects over the upper abdomen. Dilated small bowel loops again noted within the abdomen and pelvis. Oral contrast material is seen within the small bowel loops and as well as decompressed large bowel. Overall, bowel-gas pattern appears to have worsened since prior KUB concerning for worsening small bowel obstruction. IMPRESSION: Increasing small bowel distention since prior KUB concerning for worsening small bowel obstruction. This appears to be partial as contrast and gas are noted within the large bowel. Electronically Signed   By: Rolm Baptise M.D.   On: 10/06/2018 19:36   Dg Abd Portable 1v-small Bowel Obstruction Protocol-initial, 8 Hr Delay  Result Date: 10/06/2018 CLINICAL DATA:  84 year old female with a history of follow-up small-bowel obstruction EXAM: PORTABLE ABDOMEN - 1 VIEW COMPARISON:  Plain film 09/27/2018, CT 10/05/2018 FINDINGS: Enteric contrast has traversed the length of the colon, reaching the distal sigmoid colon and rectum. Gas within stomach and small bowel with no significant distension. Gastric tube projects over the upper abdomen. No displaced fracture. Degenerative changes of the thoracolumbar spine. IMPRESSION: Plain film demonstrates evidence of resolving small bowel obstruction, with no distention of small bowel and contrast within the colon. Electronically Signed   By: Corrie Mckusick D.O.   On: 10/06/2018 11:07   Dg  Abd Portable 1v-small Bowel Obstruction Protocol-initial, 8 Hr Delay  Result Date: 09/27/2018 CLINICAL DATA:  8 hour film post Gastrografin administration. EXAM: PORTABLE ABDOMEN - 1 VIEW COMPARISON:  CT abdomen dated 09/25/2018. FINDINGS: There is contrast within the colon excluding complete small bowel obstruction. There is no obvious small bowel dilatation on today's plain film. No evidence of free intraperitoneal air. Gastrostomy tube overlies the midline. IMPRESSION: Contrast is seen within the colon, thereby excluding a complete small-bowel obstruction. Electronically Signed   By: Franki Cabot M.D.   On: 09/27/2018 20:57    PATHOLOGY: I have reviewed the pathology reports as documented in the oncologist history.

## 2018-10-14 NOTE — Telephone Encounter (Signed)
Received call from Wells Guiles [facility] SN with [place] [phone]~ telephone.   Requested VO for [facility] SN to see patient 1x weekly x1 week, 2x weekly x3 weeks, and 2 PRN visits. Also requested orders for [facility] 2x weekly x2 weeks.   VO given.

## 2018-10-15 ENCOUNTER — Telehealth: Payer: Self-pay | Admitting: Family Medicine

## 2018-10-15 DIAGNOSIS — Z431 Encounter for attention to gastrostomy: Secondary | ICD-10-CM | POA: Diagnosis not present

## 2018-10-15 DIAGNOSIS — C786 Secondary malignant neoplasm of retroperitoneum and peritoneum: Secondary | ICD-10-CM | POA: Diagnosis not present

## 2018-10-15 DIAGNOSIS — E43 Unspecified severe protein-calorie malnutrition: Secondary | ICD-10-CM | POA: Diagnosis not present

## 2018-10-15 DIAGNOSIS — C7A8 Other malignant neuroendocrine tumors: Secondary | ICD-10-CM | POA: Diagnosis not present

## 2018-10-15 DIAGNOSIS — I82502 Chronic embolism and thrombosis of unspecified deep veins of left lower extremity: Secondary | ICD-10-CM | POA: Diagnosis not present

## 2018-10-15 DIAGNOSIS — E1165 Type 2 diabetes mellitus with hyperglycemia: Secondary | ICD-10-CM | POA: Diagnosis not present

## 2018-10-15 NOTE — Telephone Encounter (Signed)
P[facility] from [facility] calling to say patient has discontinued care from there service  [phone] if any questions

## 2018-10-16 ENCOUNTER — Telehealth: Payer: Self-pay | Admitting: Family Medicine

## 2018-10-16 DIAGNOSIS — C7A8 Other malignant neuroendocrine tumors: Secondary | ICD-10-CM | POA: Diagnosis not present

## 2018-10-16 DIAGNOSIS — E1165 Type 2 diabetes mellitus with hyperglycemia: Secondary | ICD-10-CM | POA: Diagnosis not present

## 2018-10-16 DIAGNOSIS — I82502 Chronic embolism and thrombosis of unspecified deep veins of left lower extremity: Secondary | ICD-10-CM | POA: Diagnosis not present

## 2018-10-16 DIAGNOSIS — Z431 Encounter for attention to gastrostomy: Secondary | ICD-10-CM | POA: Diagnosis not present

## 2018-10-16 DIAGNOSIS — E43 Unspecified severe protein-calorie malnutrition: Secondary | ICD-10-CM | POA: Diagnosis not present

## 2018-10-16 DIAGNOSIS — C786 Secondary malignant neoplasm of retroperitoneum and peritoneum: Secondary | ICD-10-CM | POA: Diagnosis not present

## 2018-10-16 NOTE — Telephone Encounter (Signed)
Called and spoke with Cecilie Lowers and gave him verbal orders to see patient.

## 2018-10-16 NOTE — Telephone Encounter (Signed)
Patient was recently release from hospital again, Theresa Howe needs verbal order to see patient at home 2x week for 3 wks.

## 2018-10-16 NOTE — Telephone Encounter (Signed)
Patty nurse with [place] called to inform us that she went out to see the patient today. Patient weight is down to 87 lbs. She is currently on a 24 hour tube feeding post discharge from hospital for bowel obstruction. Patty just wanted to make sure we were aware of her weight loss.

## 2018-10-17 ENCOUNTER — Telehealth: Payer: Self-pay | Admitting: *Deleted

## 2018-10-17 NOTE — Telephone Encounter (Signed)
She need hospital follow up

## 2018-10-17 NOTE — Telephone Encounter (Signed)
Spoke with pt's caretaker [name], confirmed pt will be here at 1pm for new pt appt. No further concerns.

## 2018-10-17 NOTE — Telephone Encounter (Signed)
Spoke with patient's caregiver [name] and informed her that patient's needs a hospital follow up. She verbalized understanding and stated that she will call back and schedule once she knows her work schedule

## 2018-10-18 ENCOUNTER — Inpatient Hospital Stay: Payer: Medicare Other

## 2018-10-18 ENCOUNTER — Inpatient Hospital Stay: Payer: Medicare Other | Attending: Hematology | Admitting: Hematology

## 2018-10-18 ENCOUNTER — Encounter: Payer: Self-pay | Admitting: Hematology

## 2018-10-18 ENCOUNTER — Other Ambulatory Visit: Payer: Self-pay

## 2018-10-18 VITALS — BP 121/60 | HR 80 | Temp 97.6°F | Resp 20 | Ht 64.0 in | Wt 90.1 lb

## 2018-10-18 DIAGNOSIS — C7A8 Other malignant neuroendocrine tumors: Secondary | ICD-10-CM | POA: Diagnosis not present

## 2018-10-18 DIAGNOSIS — R0602 Shortness of breath: Secondary | ICD-10-CM | POA: Insufficient documentation

## 2018-10-18 DIAGNOSIS — K56600 Partial intestinal obstruction, unspecified as to cause: Secondary | ICD-10-CM

## 2018-10-18 DIAGNOSIS — F0390 Unspecified dementia without behavioral disturbance: Secondary | ICD-10-CM | POA: Insufficient documentation

## 2018-10-18 DIAGNOSIS — E46 Unspecified protein-calorie malnutrition: Secondary | ICD-10-CM | POA: Insufficient documentation

## 2018-10-18 DIAGNOSIS — M810 Age-related osteoporosis without current pathological fracture: Secondary | ICD-10-CM | POA: Insufficient documentation

## 2018-10-18 DIAGNOSIS — Z7901 Long term (current) use of anticoagulants: Secondary | ICD-10-CM | POA: Diagnosis not present

## 2018-10-18 DIAGNOSIS — D649 Anemia, unspecified: Secondary | ICD-10-CM | POA: Diagnosis not present

## 2018-10-18 DIAGNOSIS — E119 Type 2 diabetes mellitus without complications: Secondary | ICD-10-CM | POA: Diagnosis not present

## 2018-10-18 DIAGNOSIS — E785 Hyperlipidemia, unspecified: Secondary | ICD-10-CM | POA: Insufficient documentation

## 2018-10-18 DIAGNOSIS — G629 Polyneuropathy, unspecified: Secondary | ICD-10-CM | POA: Diagnosis not present

## 2018-10-18 DIAGNOSIS — Z794 Long term (current) use of insulin: Secondary | ICD-10-CM | POA: Diagnosis not present

## 2018-10-18 DIAGNOSIS — M899 Disorder of bone, unspecified: Secondary | ICD-10-CM | POA: Diagnosis not present

## 2018-10-18 DIAGNOSIS — C7B8 Other secondary neuroendocrine tumors: Secondary | ICD-10-CM | POA: Insufficient documentation

## 2018-10-18 DIAGNOSIS — Z79899 Other long term (current) drug therapy: Secondary | ICD-10-CM | POA: Insufficient documentation

## 2018-10-18 DIAGNOSIS — I10 Essential (primary) hypertension: Secondary | ICD-10-CM | POA: Diagnosis not present

## 2018-10-18 DIAGNOSIS — Z86718 Personal history of other venous thrombosis and embolism: Secondary | ICD-10-CM | POA: Diagnosis not present

## 2018-10-18 DIAGNOSIS — Z931 Gastrostomy status: Secondary | ICD-10-CM | POA: Insufficient documentation

## 2018-10-18 DIAGNOSIS — I700 Atherosclerosis of aorta: Secondary | ICD-10-CM | POA: Insufficient documentation

## 2018-10-18 DIAGNOSIS — K56609 Unspecified intestinal obstruction, unspecified as to partial versus complete obstruction: Secondary | ICD-10-CM | POA: Diagnosis not present

## 2018-10-18 DIAGNOSIS — Z7189 Other specified counseling: Secondary | ICD-10-CM

## 2018-10-18 LAB — CBC WITH DIFFERENTIAL (CANCER CENTER ONLY)
ABS IMMATURE GRANULOCYTES: 0.01 10*3/uL (ref 0.00–0.07)
Basophils Absolute: 0 10*3/uL (ref 0.0–0.1)
Basophils Relative: 0 %
EOS PCT: 1 %
Eosinophils Absolute: 0 10*3/uL (ref 0.0–0.5)
HCT: 39 % (ref 36.0–46.0)
Hemoglobin: 12.1 g/dL (ref 12.0–15.0)
Immature Granulocytes: 0 %
LYMPHS ABS: 1 10*3/uL (ref 0.7–4.0)
Lymphocytes Relative: 21 %
MCH: 27.5 pg (ref 26.0–34.0)
MCHC: 31 g/dL (ref 30.0–36.0)
MCV: 88.6 fL (ref 80.0–100.0)
MONO ABS: 0.4 10*3/uL (ref 0.1–1.0)
Monocytes Relative: 8 %
NEUTROS ABS: 3.2 10*3/uL (ref 1.7–7.7)
Neutrophils Relative %: 70 %
Platelet Count: 252 10*3/uL (ref 150–400)
RBC: 4.4 MIL/uL (ref 3.87–5.11)
RDW: 17.4 % — ABNORMAL HIGH (ref 11.5–15.5)
WBC Count: 4.6 10*3/uL (ref 4.0–10.5)
nRBC: 0 % (ref 0.0–0.2)

## 2018-10-18 LAB — CMP (CANCER CENTER ONLY)
ALT: 26 U/L (ref 0–44)
AST: 22 U/L (ref 15–41)
Albumin: 3.7 g/dL (ref 3.5–5.0)
Alkaline Phosphatase: 86 U/L (ref 38–126)
Anion gap: 10 (ref 5–15)
BUN: 18 mg/dL (ref 8–23)
CHLORIDE: 104 mmol/L (ref 98–111)
CO2: 30 mmol/L (ref 22–32)
Calcium: 9.5 mg/dL (ref 8.9–10.3)
Creatinine: 0.92 mg/dL (ref 0.44–1.00)
GFR, Estimated: 55 mL/min — ABNORMAL LOW (ref >60)
Glucose, Bld: 143 mg/dL — ABNORMAL HIGH (ref 70–99)
POTASSIUM: 4.7 mmol/L (ref 3.5–5.1)
Sodium: 144 mmol/L (ref 135–145)
Total Bilirubin: 0.7 mg/dL (ref 0.3–1.2)
Total Protein: 7.2 g/dL (ref 6.5–8.1)

## 2018-10-18 NOTE — Telephone Encounter (Signed)
Appointment scheduled with MD.

## 2018-10-21 ENCOUNTER — Telehealth: Payer: Self-pay | Admitting: *Deleted

## 2018-10-21 DIAGNOSIS — C786 Secondary malignant neoplasm of retroperitoneum and peritoneum: Secondary | ICD-10-CM | POA: Diagnosis not present

## 2018-10-21 DIAGNOSIS — E43 Unspecified severe protein-calorie malnutrition: Secondary | ICD-10-CM | POA: Diagnosis not present

## 2018-10-21 DIAGNOSIS — Z431 Encounter for attention to gastrostomy: Secondary | ICD-10-CM | POA: Diagnosis not present

## 2018-10-21 DIAGNOSIS — I82502 Chronic embolism and thrombosis of unspecified deep veins of left lower extremity: Secondary | ICD-10-CM | POA: Diagnosis not present

## 2018-10-21 DIAGNOSIS — C7A8 Other malignant neuroendocrine tumors: Secondary | ICD-10-CM | POA: Diagnosis not present

## 2018-10-21 DIAGNOSIS — E1165 Type 2 diabetes mellitus with hyperglycemia: Secondary | ICD-10-CM | POA: Diagnosis not present

## 2018-10-21 LAB — CHROMOGRANIN A: Chromogranin A: 19 nmol/L — ABNORMAL HIGH (ref 0–5)

## 2018-10-21 LAB — IRON AND TIBC
Iron: 65 ug/dL (ref 41–142)
SATURATION RATIOS: 16 % — AB (ref 21–57)
TIBC: 418 ug/dL (ref 236–444)
UIBC: 352 ug/dL (ref 120–384)

## 2018-10-21 LAB — FERRITIN: Ferritin: 50 ng/mL (ref 11–307)

## 2018-10-21 LAB — SEROTONIN SERUM: SEROTONIN, SERUM: 1665 ng/mL — AB (ref 0–420)

## 2018-10-21 NOTE — Telephone Encounter (Signed)
Received call from Anna, [facility] SN with [facility]  Reports that [facility] SN visit was missed on 10/17/2018 as she had appointment the same day.

## 2018-10-23 ENCOUNTER — Telehealth: Payer: Self-pay | Admitting: *Deleted

## 2018-10-23 DIAGNOSIS — Z431 Encounter for attention to gastrostomy: Secondary | ICD-10-CM | POA: Diagnosis not present

## 2018-10-23 DIAGNOSIS — I82502 Chronic embolism and thrombosis of unspecified deep veins of left lower extremity: Secondary | ICD-10-CM | POA: Diagnosis not present

## 2018-10-23 DIAGNOSIS — C786 Secondary malignant neoplasm of retroperitoneum and peritoneum: Secondary | ICD-10-CM | POA: Diagnosis not present

## 2018-10-23 DIAGNOSIS — C7A8 Other malignant neuroendocrine tumors: Secondary | ICD-10-CM | POA: Diagnosis not present

## 2018-10-23 DIAGNOSIS — E1165 Type 2 diabetes mellitus with hyperglycemia: Secondary | ICD-10-CM | POA: Diagnosis not present

## 2018-10-23 DIAGNOSIS — E43 Unspecified severe protein-calorie malnutrition: Secondary | ICD-10-CM | POA: Diagnosis not present

## 2018-10-23 NOTE — Telephone Encounter (Signed)
Call placed to patient and patient niece [name] made aware.   Also states that patient has appointment on 10/25/2018. States that she would like to discuss ordering hospital bed for patient. MD to be made aware.

## 2018-10-23 NOTE — Telephone Encounter (Signed)
Nothing solid per the notes, including jello. Only liquid consistency.  If any questions they can call her surgeons offie

## 2018-10-23 NOTE — Telephone Encounter (Signed)
Received call from Albertha Ghee with [place] [phone], ext: [phone].  Reports that she would like clarification on diet orders. Inquired as to if patient can have full liquid diet by mouth. Advised that patient can have fluids.

## 2018-10-25 ENCOUNTER — Encounter: Payer: Self-pay | Admitting: Family Medicine

## 2018-10-25 ENCOUNTER — Ambulatory Visit (INDEPENDENT_AMBULATORY_CARE_PROVIDER_SITE_OTHER): Payer: Medicare Other | Admitting: Family Medicine

## 2018-10-25 VITALS — BP 122/60 | HR 92 | Temp 97.8°F | Resp 12 | Wt 91.0 lb

## 2018-10-25 DIAGNOSIS — F0390 Unspecified dementia without behavioral disturbance: Secondary | ICD-10-CM | POA: Diagnosis not present

## 2018-10-25 DIAGNOSIS — E43 Unspecified severe protein-calorie malnutrition: Secondary | ICD-10-CM | POA: Diagnosis not present

## 2018-10-25 DIAGNOSIS — K56609 Unspecified intestinal obstruction, unspecified as to partial versus complete obstruction: Secondary | ICD-10-CM | POA: Diagnosis not present

## 2018-10-25 DIAGNOSIS — C7A8 Other malignant neuroendocrine tumors: Secondary | ICD-10-CM | POA: Diagnosis not present

## 2018-10-25 DIAGNOSIS — C800 Disseminated malignant neoplasm, unspecified: Secondary | ICD-10-CM | POA: Diagnosis not present

## 2018-10-25 NOTE — Progress Notes (Signed)
Subjective:    Patient ID: Theresa Howe, female    DOB: [birth year], 84 y.o.   MRN: [id number]  HPI Patient is a very pleasant 84 year old African-American female here today with her family who presents today for hospital discharge follow-up.  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  It was diagnosed in 2018 with partial small bowel resection, partial omentectomy.  The pathology report showed a low-grade neuroendocrine tumor involving several lymph nodes as well as the omentum.  Since that time, the patient has had hospital admissions for partial bowel obstructions.  She was recently discharged after a bowel obstruction that resolved with conservative therapy including bowel rest.  She has been switched to continuous feeds through her G-tube.  Since the switch to continuous feeds, the patient has been at home and tolerating things well.  She denies any nausea or vomiting.  She has had 2 or 3 isolated episodes of diarrhea over the last 2 days.  She denies any fevers or chills.  Given her recent hospitalization, I do want to keep C. difficile colitis him on.  However the family members are interested in obtaining a semi-electric hospital bed.  The patient certainly has a medical condition which requires positioning of the body not feasible and ordinary bed.  She is extremely weak and frail.  They are going to start introducing clear liquids into her diet and this requires positioning at 30 degrees in the bed to feed her.  The patient requires the head of the bed to be elevated greater than 30 degrees to prevent problems with aspiration as well as because of her dysphasia which is complicated by her underlying dementia.  The family members have tried pillows and wedges in bed however this is not been successful as the patient frequently slumped over in the bed and gets into a dangerous position Still taking insulin 4 units a day.  Her last hemoglobin A1c was 6.2.   Family denies any history of hypoglycemia however she is eating very little. Past Medical History:  Diagnosis Date  . Abdominal mass 10/2017   SMALL BOWEL  . Diabetes mellitus    type 2  . Dyspnea   . Failure to thrive (0-17) 12/24/2017  . Glaucoma   . Hyperlipidemia   . Hypertension   . Neuropathy   . Osteoporosis   . Wheezing    Past Surgical History:  Procedure Laterality Date  . ABDOMINAL HYSTERECTOMY    . CATARACT EXTRACTION Right 01/2010  . COLON SURGERY    . COLONOSCOPY    . EUS N/A 04/17/2013   Procedure: UPPER ENDOSCOPIC ULTRASOUND (EUS) LINEAR;  Surgeon: Milus Banister, MD;  Location: [facility];  Service: Endoscopy;  Laterality: N/A;  . GLAUCOMA SURGERY  2007  . IR CM INJ ANY COLONIC TUBE W/FLUORO  09/12/2018  . IR FLUORO GUIDE CV LINE RIGHT  01/17/2018  . IR GASTROSTOMY TUBE MOD SED  04/11/2018  . IR IMAGE GUIDED DRAINAGE PERCUT CATH  PERITONEAL RETROPERIT  01/09/2018  . IR REMOVAL TUN CV CATH W/O FL  06/19/2018  . IR SINUS/FIST TUBE CHK-NON GI  01/18/2018  . IR US GUIDE VASC ACCESS RIGHT  01/17/2018  . LAPAROSCOPIC SMALL BOWEL RESECTION  12/04/2017  . LAPAROSCOPIC SMALL BOWEL RESECTION N/A 12/04/2017   Procedure: LAPAROSCOPIC SMALL BOWEL RESECTION;  Surgeon: Stark Klein, MD;  Location: [place];  Service: General;  Laterality: N/A;  . LAPAROSCOPY N/A 12/04/2017   Procedure: LAPAROSCOPY  Subjective:    Patient ID: Theresa Howe, female    DOB: [birth year], 84 y.o.   MRN: [id number]  HPI Patient is a very pleasant 84 year old African-American female here today with her family who presents today for hospital discharge follow-up.  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  It was diagnosed in 2018 with partial small bowel resection, partial omentectomy.  The pathology report showed a low-grade neuroendocrine tumor involving several lymph nodes as well as the omentum.  Since that time, the patient has had hospital admissions for partial bowel obstructions.  She was recently discharged after a bowel obstruction that resolved with conservative therapy including bowel rest.  She has been switched to continuous feeds through her G-tube.  Since the switch to continuous feeds, the patient has been at home and tolerating things well.  She denies any nausea or vomiting.  She has had 2 or 3 isolated episodes of diarrhea over the last 2 days.  She denies any fevers or chills.  Given her recent hospitalization, I do want to keep C. difficile colitis him on.  However the family members are interested in obtaining a semi-electric hospital bed.  The patient certainly has a medical condition which requires positioning of the body not feasible and ordinary bed.  She is extremely weak and frail.  They are going to start introducing clear liquids into her diet and this requires positioning at 30 degrees in the bed to feed her.  The patient requires the head of the bed to be elevated greater than 30 degrees to prevent problems with aspiration as well as because of her dysphasia which is complicated by her underlying dementia.  The family members have tried pillows and wedges in bed however this is not been successful as the patient frequently slumped over in the bed and gets into a dangerous position Still taking insulin 4 units a day.  Her last hemoglobin A1c was 6.2.   Family denies any history of hypoglycemia however she is eating very little. Past Medical History:  Diagnosis Date  . Abdominal mass 10/2017   SMALL BOWEL  . Diabetes mellitus    type 2  . Dyspnea   . Failure to thrive (0-17) 12/24/2017  . Glaucoma   . Hyperlipidemia   . Hypertension   . Neuropathy   . Osteoporosis   . Wheezing    Past Surgical History:  Procedure Laterality Date  . ABDOMINAL HYSTERECTOMY    . CATARACT EXTRACTION Right 01/2010  . COLON SURGERY    . COLONOSCOPY    . EUS N/A 04/17/2013   Procedure: UPPER ENDOSCOPIC ULTRASOUND (EUS) LINEAR;  Surgeon: Milus Banister, MD;  Location[facility];  Service: Endoscopy;  Laterality: N/A;  . GLAUCOMA SURGERY  2007  . IR CM INJ ANY COLONIC TUBE W/FLUORO  09/12/2018  . IR FLUORO GUIDE CV LINE RIGHT  01/17/2018  . IR GASTROSTOMY TUBE MOD SED  04/11/2018  . IR IMAGE GUIDED DRAINAGE PERCUT CATH  PERITONEAL RETROPERIT  01/09/2018  . IR REMOVAL TUN CV CATH W/O FL  06/19/2018  . IR SINUS/FIST TUBE CHK-NON GI  01/18/2018  . IR US GUIDE VASC ACCESS RIGHT  01/17/2018  . LAPAROSCOPIC SMALL BOWEL RESECTION  12/04/2017  . LAPAROSCOPIC SMALL BOWEL RESECTION N/A 12/04/2017   Procedure: LAPAROSCOPIC SMALL BOWEL RESECTION;  Surgeon: Stark Klein, MD;  Location: [place];  Service: General;  Laterality: N/A;  . LAPAROSCOPY N/A 12/04/2017   Procedure: LAPAROSCOPY  Subjective:    Patient ID: Theresa Howe, female    DOB: Dec 18, 1931, 84 y.o.   MRN: [id number]  HPI Patient is a very pleasant 84 year old African-American female here today with her family who presents today for hospital discharge follow-up.  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  It was diagnosed in 2018 with partial small bowel resection, partial omentectomy.  The pathology report showed a low-grade neuroendocrine tumor involving several lymph nodes as well as the omentum.  Since that time, the patient has had hospital admissions for partial bowel obstructions.  She was recently discharged after a bowel obstruction that resolved with conservative therapy including bowel rest.  She has been switched to continuous feeds through her G-tube.  Since the switch to continuous feeds, the patient has been at home and tolerating things well.  She denies any nausea or vomiting.  She has had 2 or 3 isolated episodes of diarrhea over the last 2 days.  She denies any fevers or chills.  Given her recent hospitalization, I do want to keep C. difficile colitis him on.  However the family members are interested in obtaining a semi-electric hospital bed.  The patient certainly has a medical condition which requires positioning of the body not feasible and ordinary bed.  She is extremely weak and frail.  They are going to start introducing clear liquids into her diet and this requires positioning at 30 degrees in the bed to feed her.  The patient requires the head of the bed to be elevated greater than 30 degrees to prevent problems with aspiration as well as because of her dysphasia which is complicated by her underlying dementia.  The family members have tried pillows and wedges in bed however this is not been successful as the patient frequently slumped over in the bed and gets into a dangerous position Still taking insulin 4 units a day.  Her last hemoglobin A1c was 6.2.   Family denies any history of hypoglycemia however she is eating very little. Past Medical History:  Diagnosis Date  . Abdominal mass 10/2017   SMALL BOWEL  . Diabetes mellitus    type 2  . Dyspnea   . Failure to thrive (0-17) 12/24/2017  . Glaucoma   . Hyperlipidemia   . Hypertension   . Neuropathy   . Osteoporosis   . Wheezing    Past Surgical History:  Procedure Laterality Date  . ABDOMINAL HYSTERECTOMY    . CATARACT EXTRACTION Right 01/2010  . COLON SURGERY    . COLONOSCOPY    . EUS N/A 04/17/2013   Procedure: UPPER ENDOSCOPIC ULTRASOUND (EUS) LINEAR;  Surgeon: Milus Banister, MD;  Location[facility];  Service: Endoscopy;  Laterality: N/A;  . GLAUCOMA SURGERY  2007  . IR CM INJ ANY COLONIC TUBE W/FLUORO  09/12/2018  . IR FLUORO GUIDE CV LINE RIGHT  01/17/2018  . IR GASTROSTOMY TUBE MOD SED  04/11/2018  . IR IMAGE GUIDED DRAINAGE PERCUT CATH  PERITONEAL RETROPERIT  01/09/2018  . IR REMOVAL TUN CV CATH W/O FL  06/19/2018  . IR SINUS/FIST TUBE CHK-NON GI  01/18/2018  . IR US GUIDE VASC ACCESS RIGHT  01/17/2018  . LAPAROSCOPIC SMALL BOWEL RESECTION  12/04/2017  . LAPAROSCOPIC SMALL BOWEL RESECTION N/A 12/04/2017   Procedure: LAPAROSCOPIC SMALL BOWEL RESECTION;  Surgeon: Stark Klein, MD;  Location: [place];  Service: General;  Laterality: N/A;  . LAPAROSCOPY N/A 12/04/2017   Procedure: LAPAROSCOPY  Subjective:    Patient ID: Theresa Howe, female    DOB: [birth year], 84 y.o.   MRN: [id number]  HPI Patient is a very pleasant 84 year old African-American female here today with her family who presents today for hospital discharge follow-up.  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  It was diagnosed in 2018 with partial small bowel resection, partial omentectomy.  The pathology report showed a low-grade neuroendocrine tumor involving several lymph nodes as well as the omentum.  Since that time, the patient has had hospital admissions for partial bowel obstructions.  She was recently discharged after a bowel obstruction that resolved with conservative therapy including bowel rest.  She has been switched to continuous feeds through her G-tube.  Since the switch to continuous feeds, the patient has been at home and tolerating things well.  She denies any nausea or vomiting.  She has had 2 or 3 isolated episodes of diarrhea over the last 2 days.  She denies any fevers or chills.  Given her recent hospitalization, I do want to keep C. difficile colitis him on.  However the family members are interested in obtaining a semi-electric hospital bed.  The patient certainly has a medical condition which requires positioning of the body not feasible and ordinary bed.  She is extremely weak and frail.  They are going to start introducing clear liquids into her diet and this requires positioning at 30 degrees in the bed to feed her.  The patient requires the head of the bed to be elevated greater than 30 degrees to prevent problems with aspiration as well as because of her dysphasia which is complicated by her underlying dementia.  The family members have tried pillows and wedges in bed however this is not been successful as the patient frequently slumped over in the bed and gets into a dangerous position Still taking insulin 4 units a day.  Her last hemoglobin A1c was 6.2.   Family denies any history of hypoglycemia however she is eating very little. Past Medical History:  Diagnosis Date  . Abdominal mass 10/2017   SMALL BOWEL  . Diabetes mellitus    type 2  . Dyspnea   . Failure to thrive (0-17) 12/24/2017  . Glaucoma   . Hyperlipidemia   . Hypertension   . Neuropathy   . Osteoporosis   . Wheezing    Past Surgical History:  Procedure Laterality Date  . ABDOMINAL HYSTERECTOMY    . CATARACT EXTRACTION Right 01/2010  . COLON SURGERY    . COLONOSCOPY    . EUS N/A 04/17/2013   Procedure: UPPER ENDOSCOPIC ULTRASOUND (EUS) LINEAR;  Surgeon: Milus Banister, MD;  Location: [facility];  Service: Endoscopy;  Laterality: N/A;  . GLAUCOMA SURGERY  2007  . IR CM INJ ANY COLONIC TUBE W/FLUORO  09/12/2018  . IR FLUORO GUIDE CV LINE RIGHT  01/17/2018  . IR GASTROSTOMY TUBE MOD SED  04/11/2018  . IR IMAGE GUIDED DRAINAGE PERCUT CATH  PERITONEAL RETROPERIT  01/09/2018  . IR REMOVAL TUN CV CATH W/O FL  06/19/2018  . IR SINUS/FIST TUBE CHK-NON GI  01/18/2018  . IR US GUIDE VASC ACCESS RIGHT  01/17/2018  . LAPAROSCOPIC SMALL BOWEL RESECTION  12/04/2017  . LAPAROSCOPIC SMALL BOWEL RESECTION N/A 12/04/2017   Procedure: LAPAROSCOPIC SMALL BOWEL RESECTION;  Surgeon: Stark Klein, MD;  Location: [place];  Service: General;  Laterality: N/A;  . LAPAROSCOPY N/A 12/04/2017   Procedure: LAPAROSCOPY

## 2018-10-26 LAB — MAGNESIUM: Magnesium: 1.5 mg/dL (ref 1.5–2.5)

## 2018-10-29 ENCOUNTER — Telehealth: Payer: Self-pay | Admitting: Family Medicine

## 2018-10-29 DIAGNOSIS — C786 Secondary malignant neoplasm of retroperitoneum and peritoneum: Secondary | ICD-10-CM | POA: Diagnosis not present

## 2018-10-29 DIAGNOSIS — I82502 Chronic embolism and thrombosis of unspecified deep veins of left lower extremity: Secondary | ICD-10-CM | POA: Diagnosis not present

## 2018-10-29 DIAGNOSIS — Z431 Encounter for attention to gastrostomy: Secondary | ICD-10-CM | POA: Diagnosis not present

## 2018-10-29 DIAGNOSIS — E43 Unspecified severe protein-calorie malnutrition: Secondary | ICD-10-CM | POA: Diagnosis not present

## 2018-10-29 DIAGNOSIS — C7A8 Other malignant neuroendocrine tumors: Secondary | ICD-10-CM | POA: Diagnosis not present

## 2018-10-29 DIAGNOSIS — E1165 Type 2 diabetes mellitus with hyperglycemia: Secondary | ICD-10-CM | POA: Diagnosis not present

## 2018-10-29 NOTE — Telephone Encounter (Signed)
Order for hospital bed and notes from [facility] faxed to [facility].

## 2018-10-30 ENCOUNTER — Telehealth: Payer: Self-pay | Admitting: *Deleted

## 2018-10-30 NOTE — Telephone Encounter (Signed)
Received call from Pattie, [facility] SN with [place] [phone]~ telephone.   Requested VO to re-certify patient for [facility] services to monitor weight. VO given.   States that patient weight has decreased slightly, but remain stable at 89lbs. Patient niece [name] reports that she is tolerating the tube feeding with 7 cartons of Vital 1.5 QD. States that she is also taking chicken broth PO.

## 2018-10-31 DIAGNOSIS — Z431 Encounter for attention to gastrostomy: Secondary | ICD-10-CM | POA: Diagnosis not present

## 2018-10-31 DIAGNOSIS — C786 Secondary malignant neoplasm of retroperitoneum and peritoneum: Secondary | ICD-10-CM | POA: Diagnosis not present

## 2018-10-31 DIAGNOSIS — E43 Unspecified severe protein-calorie malnutrition: Secondary | ICD-10-CM | POA: Diagnosis not present

## 2018-10-31 DIAGNOSIS — I82502 Chronic embolism and thrombosis of unspecified deep veins of left lower extremity: Secondary | ICD-10-CM | POA: Diagnosis not present

## 2018-10-31 DIAGNOSIS — E1165 Type 2 diabetes mellitus with hyperglycemia: Secondary | ICD-10-CM | POA: Diagnosis not present

## 2018-10-31 DIAGNOSIS — C7A8 Other malignant neuroendocrine tumors: Secondary | ICD-10-CM | POA: Diagnosis not present

## 2018-11-02 ENCOUNTER — Telehealth: Payer: Self-pay | Admitting: *Deleted

## 2018-11-02 NOTE — Telephone Encounter (Signed)
Late documentation for 11/01/2018:  Received call from [place], [facility] PT with [facility]   Requested VO to extend [facility] PT 2x weekly x3 weeks, then 1x weekly x2 weeks for gait, balance, strengthening, and coordination.   VO given.

## 2018-11-03 DIAGNOSIS — C786 Secondary malignant neoplasm of retroperitoneum and peritoneum: Secondary | ICD-10-CM | POA: Diagnosis not present

## 2018-11-03 DIAGNOSIS — I82502 Chronic embolism and thrombosis of unspecified deep veins of left lower extremity: Secondary | ICD-10-CM | POA: Diagnosis not present

## 2018-11-03 DIAGNOSIS — C7A8 Other malignant neuroendocrine tumors: Secondary | ICD-10-CM | POA: Diagnosis not present

## 2018-11-03 DIAGNOSIS — M810 Age-related osteoporosis without current pathological fracture: Secondary | ICD-10-CM | POA: Diagnosis not present

## 2018-11-03 DIAGNOSIS — E1140 Type 2 diabetes mellitus with diabetic neuropathy, unspecified: Secondary | ICD-10-CM | POA: Diagnosis not present

## 2018-11-03 DIAGNOSIS — Z431 Encounter for attention to gastrostomy: Secondary | ICD-10-CM | POA: Diagnosis not present

## 2018-11-03 DIAGNOSIS — E1165 Type 2 diabetes mellitus with hyperglycemia: Secondary | ICD-10-CM | POA: Diagnosis not present

## 2018-11-03 DIAGNOSIS — F0390 Unspecified dementia without behavioral disturbance: Secondary | ICD-10-CM | POA: Diagnosis not present

## 2018-11-03 DIAGNOSIS — D649 Anemia, unspecified: Secondary | ICD-10-CM | POA: Diagnosis not present

## 2018-11-03 DIAGNOSIS — R1312 Dysphagia, oropharyngeal phase: Secondary | ICD-10-CM | POA: Diagnosis not present

## 2018-11-03 DIAGNOSIS — Z87440 Personal history of urinary (tract) infections: Secondary | ICD-10-CM | POA: Diagnosis not present

## 2018-11-03 DIAGNOSIS — I10 Essential (primary) hypertension: Secondary | ICD-10-CM | POA: Diagnosis not present

## 2018-11-03 DIAGNOSIS — E43 Unspecified severe protein-calorie malnutrition: Secondary | ICD-10-CM | POA: Diagnosis not present

## 2018-11-03 DIAGNOSIS — Z7901 Long term (current) use of anticoagulants: Secondary | ICD-10-CM | POA: Diagnosis not present

## 2018-11-03 DIAGNOSIS — F411 Generalized anxiety disorder: Secondary | ICD-10-CM | POA: Diagnosis not present

## 2018-11-03 DIAGNOSIS — E7849 Other hyperlipidemia: Secondary | ICD-10-CM | POA: Diagnosis not present

## 2018-11-03 DIAGNOSIS — K56609 Unspecified intestinal obstruction, unspecified as to partial versus complete obstruction: Secondary | ICD-10-CM | POA: Diagnosis not present

## 2018-11-04 NOTE — Telephone Encounter (Signed)
Agree with above 

## 2018-11-05 ENCOUNTER — Telehealth: Payer: Self-pay | Admitting: *Deleted

## 2018-11-05 DIAGNOSIS — E43 Unspecified severe protein-calorie malnutrition: Secondary | ICD-10-CM | POA: Diagnosis not present

## 2018-11-05 DIAGNOSIS — I82502 Chronic embolism and thrombosis of unspecified deep veins of left lower extremity: Secondary | ICD-10-CM | POA: Diagnosis not present

## 2018-11-05 DIAGNOSIS — C786 Secondary malignant neoplasm of retroperitoneum and peritoneum: Secondary | ICD-10-CM | POA: Diagnosis not present

## 2018-11-05 DIAGNOSIS — E1165 Type 2 diabetes mellitus with hyperglycemia: Secondary | ICD-10-CM | POA: Diagnosis not present

## 2018-11-05 DIAGNOSIS — C7A8 Other malignant neuroendocrine tumors: Secondary | ICD-10-CM | POA: Diagnosis not present

## 2018-11-05 DIAGNOSIS — Z431 Encounter for attention to gastrostomy: Secondary | ICD-10-CM | POA: Diagnosis not present

## 2018-11-05 MED ORDER — NYSTATIN 100000 UNIT/ML MT SUSP: 5.0000 mL | Freq: Four times a day (QID) | OROMUCOSAL | 0 refills | Status: DC

## 2018-11-05 NOTE — Telephone Encounter (Signed)
Call placed to patient and patient niece [name] made aware per VM.   Prescription sent to pharmacy.

## 2018-11-05 NOTE — Telephone Encounter (Signed)
Agree call Dr. Barry Dienes about tube feeds Okay to Rx Nystatin oral QID for thrush until clear

## 2018-11-05 NOTE — Telephone Encounter (Signed)
Received call from Pattie, [facility] SN with [place]. [phone]~ telephone.   Reports that patient has noted weight loss. Current weight noted at 87.8lbs where last week patient was 89.2lbs. States that she will also notify Dr. Barry Dienes who is managing patient tube and nutritionist at [facility] for recommendations.   Also reports that patient has white coating to tongue and thinks she may have thrush. Requested MD to advise.   Also reports that during visit on 11/05/2018 HR was noted at 100b/min.   MD to be made aware.

## 2018-11-06 DIAGNOSIS — C7A8 Other malignant neuroendocrine tumors: Secondary | ICD-10-CM | POA: Diagnosis not present

## 2018-11-06 DIAGNOSIS — E1165 Type 2 diabetes mellitus with hyperglycemia: Secondary | ICD-10-CM | POA: Diagnosis not present

## 2018-11-06 DIAGNOSIS — Z431 Encounter for attention to gastrostomy: Secondary | ICD-10-CM | POA: Diagnosis not present

## 2018-11-06 DIAGNOSIS — I82502 Chronic embolism and thrombosis of unspecified deep veins of left lower extremity: Secondary | ICD-10-CM | POA: Diagnosis not present

## 2018-11-06 DIAGNOSIS — E43 Unspecified severe protein-calorie malnutrition: Secondary | ICD-10-CM | POA: Diagnosis not present

## 2018-11-06 DIAGNOSIS — C786 Secondary malignant neoplasm of retroperitoneum and peritoneum: Secondary | ICD-10-CM | POA: Diagnosis not present

## 2018-11-07 NOTE — Progress Notes (Signed)
[facility] CONSULT NOTE  Patient Care Team: [place], [place]ena Nunnery, MD as PCP - General (Family Medicine)  HEME/ONC OVERVIEW: 1. 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 Barry Dienes; path showed low-grade neuroendocrine tumor of the small bowel involving LN's and omentum (pT4N2), focal positive margin -12/2017: repeat diagnostic laparoscopy/laparotomy with small bowel resection; path showed low grade neuroendocrine tumor, margin not commented  -09/2018: admitted for small bowel obstruction but no mass identified; resolved with NG tube  -10/2018: dotatate PET showed multiple liver mets, mesenteric thickening (possible disease) and occult skeletal mets within thoracic/lumbar spine, ribs and sacrum   2. Hx of DVT on Eliquis ppx   3. G-tube with continuous tube feed  ACTIVE TREATMENTS: 1. Q4week lanreotide, 11/15/2018 - present  ASSESSMENT & PLAN:   Metastatic (pT4N2M1b) neuroendocrine tumor of the small bowel -I independently reviewed the radiologic images of dotatate PET and agreed with findings as documented -In summary, it showed market tracer uptake within the liver, thoracic/lumbar spine, ribs, and sacrum, consistent with metastases -Serum serotonin level significantly elevated  -Given the significant tumor burden, I recommend starting q4week lanreotide injection with the goal to delay tumor progression; tentatively on 11/15/2018  -We will plan to repeat scans in 6 months to monitor for any sign of disease progression  -If patient progresses on lanreotide, Lu-dotatate treatment will be a reasonable treatment option   Metastases to the liver -Patient is asymptomatic -LFT's normal -We will continue to monitor it for now; liver-directed therapy and SBRT may be options in the future if local progression   Metastases to the  bones -Patient is asymptomatic -No corresponding bony lesion on CT; given the low risk of pathologic fracture in the absence of bony erosion, we will hold off dental referral or bisphosphonates for now  Protein malnutrition  -On continuous tube feed since discharge in 09/2018  -Her weight is overall stable ~90lbs   Hx of DVT -No symptom or sign of bleeding or excess bruising -Continue Eliquis 2.36m BID for secondary ppx for now   Early dementia -Patient was diagnosed with early dementia in January 2019 -She is currently requiring sitter during the day for ADLs  Goals of care discussion -I discussed with the patient and her family in detail regarding diagnosis, treatment options, natural history and prognosis of the disease -I informed them that the disease is not curable, but relatively indolent based on recent pathology and treatable -Given the suspected metastatic disease that could potentially cause organ dysfunction, I recommended starting lanreotide -However, given her age and other comorbidities, we will need to weigh the benefits and risks of potential treatment options carefully -The patient's family expressed goal to focus on her quality of life and not pursuing any aggressive treatment that is associated with significant toxicities   Orders Placed This Encounter  Procedures  . CBC with Differential (Cancer Center Only)    Standing Status:   Future    Standing Expiration Date:   12/13/2019  . CMP ([place])    Standing Status:   Future    Standing Expiration Date:   12/13/2019  . Serotonin serum    Standing Status:   Future    Standing Expiration Date:   11/08/2019  . Chromogranin A    Standing Status:   Future    Standing Expiration Date:   11/08/2019   A total of more than  40 minutes were spent face-to-face with the patient during this encounter and over half of that time was spent on counseling and coordination of care as outlined above.    All questions  were answered. The patient knows to call the clinic with any problems, questions or concerns.  Return to clinic in 3 months for labs and follow-up.   Tish Men, MD 11/08/2018 4:09 PM   CHIEF COMPLAINTS "I am doing okay today"  INTERVAL HISTORY:  Ms. Tesar returns to clinic for follow-up of dotatate PET scan results.  She was accompanied by her family.  Per the patient's family, patient is doing well overall, except intermittent lower abdominal pain that is usually relieved by bowel movement.  She is on continuous tube feed and tolerating it well without significant early satiety or abdominal discomfort.  Her bowel movement is relatively normal, with occasional watery stool once every few days.  She has sitter during the day while her family is at work, but is able to do some chores around the house, such as Tax adviser.  She is oriented to person most of the time, but occasionally forgets her family's names.  Her weight fluctuates between mid 80s to 90s pounds.  I have reviewed her chart and materials related to her cancer extensively and collaborated history with the patient. Summary of oncologic history is as follows:   Primary malignant neuroendocrine tumor of small intestine ([place])   10/03/2017 Imaging    MRI abdomen: 1. Nondiagnostic study due to motion artifact. 2. Diffuse distention of main pancreatic duct in the head and body of pancreas as seen on CT. 3. 11 mm enhancing lesion in the inferior right liver cannot be further characterized. Follow-up imaging may prove helpful to ensure stability. If the patient is able to receive iodinated intravenous contrast, CT may be a better means of follow-up due to the better temporal resolution of CT imaging.    10/17/2017 Imaging    CT pancreas protocol: IMPRESSION: 1. Subcapsular enhancing lesion in the RIGHT hepatic lobe is favored a benign vascular phenomena. Recommend attention on follow-up. 2. Dilatation of the pancreatic duct without  obstructing mass identified. Cannot exclude a ampullary lesion. Consider ERCP for evaluation. 3. The small bowel and pelvic mesenteric mass are not imaged on CT abdomen. In patient with potential neuroendocrine tumor, consider a Ga 41 DOTATATE PET/CT scan which has high sensitivity and specificity for well differentiated neuroendocrine tumors. This could also evaluate the potential ampullary lesion if of neuroendocrine origin.    12/04/2017 Surgery    Diagnostic laparoscopy with small bowel resection, partial omentectomy by Dr. Barry Dienes    12/04/2017 Pathology Results    (Accession: [id number]) 1. Small intestine, resection for tumor - LOW GRADE NEUROENDOCRINE TUMOR, 3.8 CM, EXTENDING TO SMALL BOWEL SEROSA. - PROXIMAL AND DISTAL MARGINS FREE OF TUMOR. - FOUR CM MESENTERIC NODULE CONSISTENT WITH LYMPH NODE METASTASIS. - METASTATIC LOW GRADE NEUROENDOCRINE TUMOR IN THREE OF ELEVEN LYMPH NODES (3/11). 2. Omentum, biopsy - NODULES OF LOW GRADE NEUROENDOCRINE TUMOR INVOLVING OMENTAL ADIPOSE TISSUE.  pT4pN2    12/04/2017 Cancer Staging    Staging form: Small Intestine - Other Histologies, AJCC 8th Edition - Clinical stage from 12/04/2017: cT4, cN2, cM1 - Signed by Tish Men, MD on 10/14/2018    12/27/2017 Surgery    REPEAT DIAGNOSTIC LAPAROSCOPY/ LAPAROTOMY WITH SMALL BOWEL RESECTION (due to positive margin)    12/27/2017 Pathology Results    (Accession: [id number]) Small intestine, resection - LOW GRADE NEUROENDOCRINE TUMOR (CARCINOID TUMOR) INVOLVING  SMOOTH MUSCLE AND FIBROADIPOSE TISSUE. - BENIGN SMALL BOWEL TYPE MUCOSA.    01/16/2018 Imaging    CT abdomen/pelvis  IMPRESSION: Further continued decrease in right pelvic abscess. This appears completely drained presently, with only a small amount of density surrounding the drainage catheter.  New demonstration of 2 small hyperdense areas in the right lower quadrant adjacent to the anastomosis/drainage catheter, the larger  measuring 2 cm in  diameter. The differential diagnosis is postprocedural hematoma versus rapid recurrence of tumor versus is small leak collections. These warrant additional follow-up.    09/25/2018 Imaging    CT abdomen/pelvis w/ contrast: 1. Small bowel obstruction with transition zone in the right mid abdomen. No obstructing mass is identified. Small bowel fold thickening suggesting associated enteritis. 2. Small amount of free fluid in the abdomen and pelvis, likely ascites. No free air. 3. Vaginal pessary.    10/05/2018 Imaging    CT abdomen/pelvis w/ contrast: IMPRESSION: 1. Small bowel obstruction with transition point in the left lower quadrant. Minimal contrast is seen into the colon. 2. Moderate stool in the distal sigmoid colon and rectum without distal obstruction. 3. A small amount of free fluid is likely reactive. 4. Stable intra and extrahepatic biliary dilation. 5.  Aortic Atherosclerosis (ICD10-I70.0).    11/08/2018 Imaging    Dotatate PET: 1. Multiple foci of intensely hypermetabolic LIVER METASTASIS ( 8 lesions). 2. Moderate linear uptake within the mesentery RIGHT lower quadrant associated with mesenteric thickening. Differential includes postsurgical inflammation versus metastatic disease to the mesentery. No clear bowel lesion identified. 3. SKELETAL METASTASIS within the thoracic and lumbar spine as well as ribs and sacrum. These lesions are occult on CT. 4. Dilatation of the proximal small bowel follow multiple surgeries. Differential include partial obstruction versus ileus.     MEDICAL HISTORY:  Past Medical History:  Diagnosis Date  . Abdominal mass 10/2017   SMALL BOWEL  . Diabetes mellitus    type 2  . Dyspnea   . Failure to thrive (0-17) 12/24/2017  . Glaucoma   . Hyperlipidemia   . Hypertension   . Neuropathy   . Osteoporosis   . Wheezing     SURGICAL HISTORY: Past Surgical History:  Procedure Laterality Date  . ABDOMINAL HYSTERECTOMY    . CATARACT  EXTRACTION Right 01/2010  . COLON SURGERY    . COLONOSCOPY    . EUS N/A 04/17/2013   Procedure: UPPER ENDOSCOPIC ULTRASOUND (EUS) LINEAR;  Surgeon: Milus Banister, MD;  Location: [facility];  Service: Endoscopy;  Laterality: N/A;  . GLAUCOMA SURGERY  2007  . IR CM INJ ANY COLONIC TUBE W/FLUORO  09/12/2018  . IR FLUORO GUIDE CV LINE RIGHT  01/17/2018  . IR GASTROSTOMY TUBE MOD SED  04/11/2018  . IR IMAGE GUIDED DRAINAGE PERCUT CATH  PERITONEAL RETROPERIT  01/09/2018  . IR REMOVAL TUN CV CATH W/O FL  06/19/2018  . IR SINUS/FIST TUBE CHK-NON GI  01/18/2018  . IR US GUIDE VASC ACCESS RIGHT  01/17/2018  . LAPAROSCOPIC SMALL BOWEL RESECTION  12/04/2017  . LAPAROSCOPIC SMALL BOWEL RESECTION N/A 12/04/2017   Procedure: LAPAROSCOPIC SMALL BOWEL RESECTION;  Surgeon: Stark Klein, MD;  Location: [facility];  Service: General;  Laterality: N/A;  . LAPAROSCOPY N/A 12/04/2017   Procedure: LAPAROSCOPY DIAGNOSTIC;  Surgeon: Stark Klein, MD;  Location: [facility];  Service: General;  Laterality: N/A;  . LAPAROSCOPY N/A 12/27/2017   Procedure: DIAGNOSTIC LAPAROSCOPY/ LAPAROTOMY WITH SMALL BOWEL RESECTION TIMES TWO;  Surgeon: Stark Klein, MD;  Location: [facility]  OR;  Service: General;  Laterality: N/A;  . MULTIPLE TOOTH EXTRACTIONS    . PICC LINE INSERTION  12/24/2017    SOCIAL HISTORY: Social History   Socioeconomic History  . Marital status: Widowed    Spouse name: Not on file  . Number of children: Not on file  . Years of education: Not on file  . Highest education level: Not on file  Occupational History  . Not on file  Social Needs  . Financial resource strain: Not on file  . Food insecurity:    Worry: Not on file    Inability: Not on file  . Transportation needs:    Medical: Not on file    Non-medical: Not on file  Tobacco Use  . Smoking status: Never Smoker  . Smokeless tobacco: Never Used  Substance and Sexual Activity  . Alcohol use: No  . Drug use: No  . Sexual activity: Not Currently  Lifestyle   . Physical activity:    Days per week: Not on file    Minutes per session: Not on file  . Stress: Not on file  Relationships  . Social connections:    Talks on phone: Not on file    Gets together: Not on file    Attends religious service: Not on file    Active member of club or organization: Not on file    Attends meetings of clubs or organizations: Not on file    Relationship status: Not on file  . Intimate partner violence:    Fear of current or ex partner: Not on file    Emotionally abused: Not on file    Physically abused: Not on file    Forced sexual activity: Not on file  Other Topics Concern  . Not on file  Social History Narrative  . Not on file    FAMILY HISTORY: History reviewed. No pertinent family history.  ALLERGIES:  has No Known Allergies.  MEDICATIONS:  Current Outpatient Medications  Medication Sig Dispense Refill  . acetaminophen (TYLENOL) 325 MG tablet Take 650 mg by mouth every 6 (six) hours as needed for mild pain, moderate pain or headache.     Marland Kitchen apixaban (ELIQUIS) 2.5 MG TABS tablet Take 2.5 mg by mouth 2 (two) times daily.    . Blood Glucose Monitoring Suppl (BLOOD GLUCOSE SYSTEM PAK) KIT Please dispense based on patient and insurance preference. Use as directed to monitor FSBS 1x daily. Dx: E11.9. 1 each 0  . Glucose Blood (BLOOD GLUCOSE TEST STRIPS) STRP Please dispense based on patient and insurance preference. Use as directed to monitor FSBS 1x daily. Dx: E11.9. 50 each 11  . insulin detemir (LEVEMIR) 100 UNIT/ML injection Inject 4 Units into the skin at bedtime.     . Insulin Pen Needle (BD PEN NEEDLE MICRO U/F) 32G X 6 MM MISC Use with insulin pen ICD10:e11.43 100 each 1  . Lancets MISC Please dispense based on patient and insurance preference. Use as directed to monitor FSBS 1x daily. Dx: E11.9. 50 each 11  . Metoclopramide HCl 5 MG TBDP Take 5 mg by mouth 3 (three) times daily.  0  . nystatin (MYCOSTATIN) 100000 UNIT/ML suspension Take 5 mLs  (500,000 Units total) by mouth 4 (four) times daily. Swish and spit. 60 mL 0  . ondansetron (ZOFRAN) 4 MG tablet Take 4 mg by mouth every 8 (eight) hours as needed for nausea or vomiting.    . pantoprazole (PROTONIX) 40 MG tablet Take 40 mg by mouth  daily.     No current facility-administered medications for this visit.     REVIEW OF SYSTEMS:   Constitutional: ( - ) fevers, ( - )  chills , ( - ) night sweats Eyes: ( - ) blurriness of vision, ( - ) double vision, ( - ) watery eyes Ears, nose, mouth, throat, and face: ( - ) mucositis, ( - ) sore throat Respiratory: ( - ) cough, ( - ) dyspnea, ( - ) wheezes Cardiovascular: ( - ) palpitation, ( - ) chest discomfort, ( - ) lower extremity swelling Gastrointestinal:  ( - ) nausea, ( - ) heartburn, ( - ) change in bowel habits Skin: ( - ) abnormal skin rashes Lymphatics: ( - ) new lymphadenopathy, ( - ) easy bruising Neurological: ( - ) numbness, ( - ) tingling, ( - ) new weaknesses Behavioral/Psych: ( - ) mood change, ( - ) new changes  All other systems were reviewed with the patient and are negative.  PHYSICAL EXAMINATION: ECOG PERFORMANCE STATUS: 2 - Symptomatic, <50% confined to bed  Vitals:   11/08/18 1443  BP: 135/75  Pulse: 90  Resp: 18  Temp: 97.6 F (36.4 C)  SpO2: 98%   Filed Weights   11/08/18 1443  Weight: 91 lb (41.3 kg)    GENERAL: alert, no distress and comfortable, very thin, pleasant  SKIN: skin color, texture, turgor are normal, no rashes or significant lesions EYES: conjunctiva are pink and non-injected, sclera clear OROPHARYNX: no exudate, no erythema; lips, buccal mucosa, and tongue normal  NECK: supple, non-tender LYMPH:  no palpable lymphadenopathy in the cervical or axillary LUNGS: clear to auscultation and percussion with normal breathing effort HEART: regular rate & rhythm, no murmurs, no lower extremity edema ABDOMEN: soft, non-tender, non-distended, normal bowel sounds, G-tube noted   Musculoskeletal: no cyanosis of digits and no clubbing  PSYCH: alert, oriented to person and situation, but not to date NEURO: no focal motor/sensory deficits  LABORATORY DATA:  I have reviewed the data as listed Lab Results  Component Value Date   WBC 4.6 10/18/2018   HGB 12.1 10/18/2018   HCT 39.0 10/18/2018   MCV 88.6 10/18/2018   PLT 252 10/18/2018   Lab Results  Component Value Date   NA 144 10/18/2018   K 4.7 10/18/2018   CL 104 10/18/2018   CO2 30 10/18/2018    RADIOGRAPHIC STUDIES: I have personally reviewed the radiological images as listed and agreed with the findings in the report. Nm Pet (netspot Ga 68 Dotatate) Skull Base To Mid Thigh  Result Date: 11/08/2018 CLINICAL DATA:  Neuroendocrine tumor of the small intestine duodenum. Low-grade neuroendocrine tumor of the small bowel involving LN's and omentum (pT4N2), focal positive margin. EXAM: NUCLEAR MEDICINE PET SKULL BASE TO THIGH TECHNIQUE: 2.4 mCi Ga 69 DOTATATE was injected intravenously. Full-ring PET imaging was performed from the skull base to thigh after the radiotracer. CT data was obtained and used for attenuation correction and anatomic localization. COMPARISON:  CT 09/25/2018 FINDINGS: NECK No radiotracer activity in neck lymph nodes. Incidental CT findings: None CHEST No radiotracer accumulation within mediastinal or hilar lymph nodes. No suspicious pulmonary nodules on the CT scan. Incidental CT finding:None ABDOMEN/PELVIS Two adjacent lesions in the LEFT lateral hepatic lobe with intense radiotracer activity (SUV max equal 35.8). There is accompanied by subtle low density lesion on the CT portion exam measuring approximately 10 mm each. IN the subcapsular RIGHT hepatic lobe, two adjacent lesions with intense radiotracer activity (SUV max  equal 33.4). These are larger measuring 2.1 and 1.5 cm (image 78/4). Additional subcapsular RIGHT hepatic lobe lesion more superiorly with SUV max equal 30.8. Incidentally  these lesions were more occult on comparison CT as neuroendocrine tumor metastasis enhancing similar to the liver parenchyma. On current exam lesions are more distinctive without IV contrast. There is linear metabolic activity in RIGHT lower quadrant within the mesentery adjacent to the enteric enteric anastomosis (image 105/4). Activity is moderate with SUV max equal 10.6. Of note the small bowel is dilated proximally up to 5 cm. Percutaneous gastrostomy tube in stomach Physiologic activity noted in the liver, spleen, adrenal glands and kidneys. Incidental CT findings:None SKELETON Focus of intense radiotracer activity within the T10 vertebral body with SUV max equal 47.4. Lesion not well depicted on CT portion exam. Additional lesion in the L2 vertebral body with SUV max equal 45.3. Lesion in the LEFT sacrum with SUV max equal 21.8. This lesion is also essentially occult by CT. Additional rib lesion on the LEFT and RIGHT. The larger lesion on the LEFT within the sixth rib posteriorly mild sclerosis in this region. Incidental CT findings:None IMPRESSION: 1. Multiple foci of intensely hypermetabolic LIVER METASTASIS ( 8 lesions). 2. Moderate linear uptake within the mesentery RIGHT lower quadrant associated with mesenteric thickening. Differential includes postsurgical inflammation versus metastatic disease to the mesentery. No clear bowel lesion identified. 3. SKELETAL METASTASIS within the thoracic and lumbar spine as well as ribs and sacrum. These lesions are occult on CT. 4. Dilatation of the proximal small bowel follow multiple surgeries. Differential include partial obstruction versus ileus. These results will be called to the ordering clinician or representative by the Radiologist Assistant, and communication documented in the PACS or zVision Dashboard. Electronically Signed   By: Suzy Bouchard M.D.   On: 11/08/2018 13:47    PATHOLOGY: I have reviewed the pathology reports as documented in the  oncologist history.

## 2018-11-08 ENCOUNTER — Inpatient Hospital Stay (HOSPITAL_BASED_OUTPATIENT_CLINIC_OR_DEPARTMENT_OTHER): Payer: Medicare Other | Admitting: Hematology

## 2018-11-08 ENCOUNTER — Encounter: Payer: Self-pay | Admitting: Hematology

## 2018-11-08 ENCOUNTER — Encounter (HOSPITAL_COMMUNITY)
Admission: RE | Admit: 2018-11-08 | Discharge: 2018-11-08 | Disposition: A | Discharge status: Home / Self Care | Payer: Medicare Other | Source: Ambulatory Visit | Attending: Hematology | Admitting: Hematology

## 2018-11-08 ENCOUNTER — Other Ambulatory Visit: Payer: Self-pay | Admitting: Hematology

## 2018-11-08 VITALS — BP 135/75 | HR 90 | Temp 97.6°F | Resp 18 | Ht 64.0 in | Wt 91.0 lb

## 2018-11-08 DIAGNOSIS — E46 Unspecified protein-calorie malnutrition: Secondary | ICD-10-CM | POA: Diagnosis not present

## 2018-11-08 DIAGNOSIS — E440 Moderate protein-calorie malnutrition: Secondary | ICD-10-CM

## 2018-11-08 DIAGNOSIS — E785 Hyperlipidemia, unspecified: Secondary | ICD-10-CM

## 2018-11-08 DIAGNOSIS — R0602 Shortness of breath: Secondary | ICD-10-CM

## 2018-11-08 DIAGNOSIS — G629 Polyneuropathy, unspecified: Secondary | ICD-10-CM

## 2018-11-08 DIAGNOSIS — C7A8 Other malignant neuroendocrine tumors: Secondary | ICD-10-CM | POA: Insufficient documentation

## 2018-11-08 DIAGNOSIS — C7951 Secondary malignant neoplasm of bone: Secondary | ICD-10-CM | POA: Diagnosis not present

## 2018-11-08 DIAGNOSIS — K56609 Unspecified intestinal obstruction, unspecified as to partial versus complete obstruction: Secondary | ICD-10-CM | POA: Insufficient documentation

## 2018-11-08 DIAGNOSIS — Z431 Encounter for attention to gastrostomy: Secondary | ICD-10-CM | POA: Diagnosis not present

## 2018-11-08 DIAGNOSIS — Z794 Long term (current) use of insulin: Secondary | ICD-10-CM

## 2018-11-08 DIAGNOSIS — C787 Secondary malignant neoplasm of liver and intrahepatic bile duct: Secondary | ICD-10-CM | POA: Insufficient documentation

## 2018-11-08 DIAGNOSIS — Z931 Gastrostomy status: Secondary | ICD-10-CM

## 2018-11-08 DIAGNOSIS — Z79899 Other long term (current) drug therapy: Secondary | ICD-10-CM | POA: Diagnosis not present

## 2018-11-08 DIAGNOSIS — F0390 Unspecified dementia without behavioral disturbance: Secondary | ICD-10-CM

## 2018-11-08 DIAGNOSIS — D649 Anemia, unspecified: Secondary | ICD-10-CM

## 2018-11-08 DIAGNOSIS — C7A010 Malignant carcinoid tumor of the duodenum: Secondary | ICD-10-CM | POA: Diagnosis not present

## 2018-11-08 DIAGNOSIS — I10 Essential (primary) hypertension: Secondary | ICD-10-CM

## 2018-11-08 DIAGNOSIS — M810 Age-related osteoporosis without current pathological fracture: Secondary | ICD-10-CM

## 2018-11-08 DIAGNOSIS — E119 Type 2 diabetes mellitus without complications: Secondary | ICD-10-CM | POA: Diagnosis not present

## 2018-11-08 DIAGNOSIS — C7B8 Other secondary neuroendocrine tumors: Secondary | ICD-10-CM

## 2018-11-08 DIAGNOSIS — E1165 Type 2 diabetes mellitus with hyperglycemia: Secondary | ICD-10-CM | POA: Diagnosis not present

## 2018-11-08 DIAGNOSIS — Z86718 Personal history of other venous thrombosis and embolism: Secondary | ICD-10-CM

## 2018-11-08 DIAGNOSIS — Z7189 Other specified counseling: Secondary | ICD-10-CM

## 2018-11-08 DIAGNOSIS — C786 Secondary malignant neoplasm of retroperitoneum and peritoneum: Secondary | ICD-10-CM | POA: Diagnosis not present

## 2018-11-08 DIAGNOSIS — M899 Disorder of bone, unspecified: Secondary | ICD-10-CM | POA: Diagnosis not present

## 2018-11-08 DIAGNOSIS — Z7901 Long term (current) use of anticoagulants: Secondary | ICD-10-CM

## 2018-11-08 DIAGNOSIS — I700 Atherosclerosis of aorta: Secondary | ICD-10-CM

## 2018-11-08 DIAGNOSIS — E43 Unspecified severe protein-calorie malnutrition: Secondary | ICD-10-CM | POA: Diagnosis not present

## 2018-11-08 DIAGNOSIS — I82502 Chronic embolism and thrombosis of unspecified deep veins of left lower extremity: Secondary | ICD-10-CM | POA: Diagnosis not present

## 2018-11-08 MED ORDER — GALLIUM GA 68 DOTATATE IV KIT
2.4000 | PACK | Freq: Once | INTRAVENOUS | Status: AC
  Administered 2018-11-08: 2.4 via INTRAVENOUS

## 2018-11-11 ENCOUNTER — Telehealth: Payer: Self-pay | Admitting: Hematology

## 2018-11-11 DIAGNOSIS — I82502 Chronic embolism and thrombosis of unspecified deep veins of left lower extremity: Secondary | ICD-10-CM | POA: Diagnosis not present

## 2018-11-11 DIAGNOSIS — C7A8 Other malignant neuroendocrine tumors: Secondary | ICD-10-CM | POA: Diagnosis not present

## 2018-11-11 DIAGNOSIS — E1165 Type 2 diabetes mellitus with hyperglycemia: Secondary | ICD-10-CM | POA: Diagnosis not present

## 2018-11-11 DIAGNOSIS — C786 Secondary malignant neoplasm of retroperitoneum and peritoneum: Secondary | ICD-10-CM | POA: Diagnosis not present

## 2018-11-11 DIAGNOSIS — E43 Unspecified severe protein-calorie malnutrition: Secondary | ICD-10-CM | POA: Diagnosis not present

## 2018-11-11 DIAGNOSIS — Z431 Encounter for attention to gastrostomy: Secondary | ICD-10-CM | POA: Diagnosis not present

## 2018-11-11 NOTE — Telephone Encounter (Signed)
Spoke with patient's caretaker and advised her of appointment that have been added to schedule/ Letter/calendar mailed also per her request per 11/22 los

## 2018-11-12 ENCOUNTER — Ambulatory Visit (INDEPENDENT_AMBULATORY_CARE_PROVIDER_SITE_OTHER): Payer: Medicare Other | Admitting: Family Medicine

## 2018-11-12 ENCOUNTER — Other Ambulatory Visit: Payer: Self-pay

## 2018-11-12 ENCOUNTER — Encounter: Payer: Self-pay | Admitting: Family Medicine

## 2018-11-12 VITALS — BP 128/70 | HR 88 | Temp 98.2°F | Resp 16 | Ht 64.0 in | Wt 89.4 lb

## 2018-11-12 DIAGNOSIS — C7A8 Other malignant neuroendocrine tumors: Secondary | ICD-10-CM | POA: Diagnosis not present

## 2018-11-12 DIAGNOSIS — Z794 Long term (current) use of insulin: Secondary | ICD-10-CM

## 2018-11-12 DIAGNOSIS — E43 Unspecified severe protein-calorie malnutrition: Secondary | ICD-10-CM

## 2018-11-12 DIAGNOSIS — B370 Candidal stomatitis: Secondary | ICD-10-CM

## 2018-11-12 DIAGNOSIS — E1143 Type 2 diabetes mellitus with diabetic autonomic (poly)neuropathy: Secondary | ICD-10-CM

## 2018-11-12 LAB — HEMOGLOBIN A1C, FINGERSTICK

## 2018-11-12 NOTE — Progress Notes (Signed)
Subjective:    Patient ID: Theresa Howe, female    DOB: 1931/01/05, 84 y.o.   MRN: [id number]  Patient presents for Follow-up Patient here for follow-up.  She has had a very challenging year since diagnosis of her neuroendocrine tumor of the small bowel which is metastatic.  Currently has a G-tube which she gets tube feeds daily.  Her niece had some questions about addition of protein which is what the home health had recommended with orders have not been signed off by the surgeon yet.  She is unable to tolerate anything by mouth besides all sips of water.  She has been extremely weak and frail but she has not had any falls.  She is currently living with her nephew Veronia Beets.  Diabetes mellitus last A1c was 6.2% her insulin was stopped secondary to her poor intake.  The past week her blood sugars have been elevated she had a fasting blood sugar of 200 for 4 days but this morning was down to the 120s.  Family was unsure if they were supposed to restart any insulin  Her only wears depends for bowel and bladder control.  She requires assistance with all ADLs.  Unfortunately because of her asked that she does not qualify for Medicaid therefore family is pain for an aide to be in the home as well.  Her only oral medications that she takes daily are Eliquis, Reglan and Protonix.  She has not required a nausea medication. She was recently seen by her oncologist had a PET scan done.  They are planning to restart chemotherapy injections starting this Friday.  Protein malnutrition in the setting of her cancer she is been unable to get her weight above 95 pounds typically between 88 to 95 pounds  Review Of Systems:  GEN- + fatigue, fever, weight loss,weakness, recent illness HEENT- denies eye drainage, change in vision, nasal discharge, CVS- denies chest pain, palpitations RESP- denies SOB, cough, wheeze ABD- denies N/V, change in stools, abd pain GU- denies dysuria, hematuria, dribbling,  incontinence MSK- denies joint pain, muscle aches, injury Neuro- denies headache, dizziness, syncope, seizure activity       Objective:    BP 128/70   Pulse 88   Temp 98.2 F (36.8 C) (Oral)   Resp 16   Ht 5\' 4"  (1.626 m)   Wt 89 lb 6.4 oz (40.6 kg)   SpO2 98%   BMI 15.35 kg/m  GEN- NAD, alert and oriented x3,frail chronically ill appearing  HEENT- PERRL, EOMI, non injected sclera, pink conjunctiva, MMM, oropharynx -thrush on tongue Neck- Supple, no thyromegaly CVS- RRR, no murmur RESP-CTAB ABD-NABS,soft,NT,ND,g tube in place EXT- No edema Pulses- Radial 2+        Assessment & Plan:      Problem List Items Addressed This Visit      Unprioritized   Diabetes with neurologic complications (HCC)    Diabetes has been controlled despite the tube feedings.  Her fasting blood sugars have been a little elevated , left before A1c could be obtained today this will be done on Monday.  For now I will have him continue to just monitor her blood sugars and not start any insulin therapy.  Her A1c is elevated we will start back the Levemir at 4 units at bedtime      Relevant Orders   Hemoglobin A1C, fingerstick (Completed)   Diabetic neuropathy (HCC)   Oral thrush    Continue the nystatin      Primary malignant  neuroendocrine tumor of small intestine (HCC)    Malnourished in setting of  with metastatic cancer.  She is following with oncology will restart treatments this Friday.  Her surgeon is managing her tube feedings Note she does not qualify for medicaid Family is bearing most expenses      Protein-calorie malnutrition, severe - Primary      Note: This dictation was prepared with Dragon dictation along with smaller phrase technology. Any transcriptional errors that result from this process are unintentional.

## 2018-11-13 ENCOUNTER — Encounter: Payer: Self-pay | Admitting: Family Medicine

## 2018-11-13 DIAGNOSIS — I82502 Chronic embolism and thrombosis of unspecified deep veins of left lower extremity: Secondary | ICD-10-CM | POA: Diagnosis not present

## 2018-11-13 DIAGNOSIS — Z431 Encounter for attention to gastrostomy: Secondary | ICD-10-CM | POA: Diagnosis not present

## 2018-11-13 DIAGNOSIS — C7A8 Other malignant neuroendocrine tumors: Secondary | ICD-10-CM | POA: Diagnosis not present

## 2018-11-13 DIAGNOSIS — E43 Unspecified severe protein-calorie malnutrition: Secondary | ICD-10-CM | POA: Diagnosis not present

## 2018-11-13 DIAGNOSIS — E1165 Type 2 diabetes mellitus with hyperglycemia: Secondary | ICD-10-CM | POA: Diagnosis not present

## 2018-11-13 DIAGNOSIS — C786 Secondary malignant neoplasm of retroperitoneum and peritoneum: Secondary | ICD-10-CM | POA: Diagnosis not present

## 2018-11-13 NOTE — Assessment & Plan Note (Signed)
Continue the nystatin.

## 2018-11-13 NOTE — Assessment & Plan Note (Addendum)
Diabetes has been controlled despite the tube feedings.  Her fasting blood sugars have been a little elevated , left before A1c could be obtained today this will be done on Monday.  For now I will have him continue to just monitor her blood sugars and not start any insulin therapy.  Her A1c is elevated we will start back the Levemir at 4 units at bedtime

## 2018-11-13 NOTE — Assessment & Plan Note (Addendum)
Malnourished in setting of  with metastatic cancer.  She is following with oncology will restart treatments this Friday.  Her surgeon is managing her tube feedings Note she does not qualify for medicaid Family is bearing most expenses

## 2018-11-15 ENCOUNTER — Other Ambulatory Visit: Payer: Self-pay | Admitting: Hematology

## 2018-11-15 ENCOUNTER — Other Ambulatory Visit: Payer: Self-pay

## 2018-11-15 ENCOUNTER — Inpatient Hospital Stay: Payer: Medicare Other

## 2018-11-15 VITALS — BP 107/75 | Temp 97.5°F | Resp 17

## 2018-11-15 DIAGNOSIS — C7B8 Other secondary neuroendocrine tumors: Secondary | ICD-10-CM | POA: Diagnosis not present

## 2018-11-15 DIAGNOSIS — C7A8 Other malignant neuroendocrine tumors: Secondary | ICD-10-CM

## 2018-11-15 DIAGNOSIS — E46 Unspecified protein-calorie malnutrition: Secondary | ICD-10-CM | POA: Diagnosis not present

## 2018-11-15 DIAGNOSIS — M899 Disorder of bone, unspecified: Secondary | ICD-10-CM | POA: Diagnosis not present

## 2018-11-15 DIAGNOSIS — Z79899 Other long term (current) drug therapy: Secondary | ICD-10-CM | POA: Diagnosis not present

## 2018-11-15 DIAGNOSIS — K56609 Unspecified intestinal obstruction, unspecified as to partial versus complete obstruction: Secondary | ICD-10-CM | POA: Diagnosis not present

## 2018-11-15 MED ORDER — LANREOTIDE ACETATE 120 MG/0.5ML ~~LOC~~ SOLN
120.0000 mg | Freq: Once | SUBCUTANEOUS | Status: AC
  Administered 2018-11-15: 120 mg via SUBCUTANEOUS

## 2018-11-15 NOTE — Patient Instructions (Signed)
Lanreotide injection What is this medicine? LANREOTIDE (lan REE oh tide) is used to reduce blood levels of growth hormone in patients with a condition called acromegaly. It also works to slow or stop tumor growth in patients with neuroendocrine tumors and treat carcinoid syndrome. This medicine may be used for other purposes; ask your health care provider or pharmacist if you have questions. COMMON BRAND NAME(S): Somatuline Dep[place] What should I tell my health care provider before I take this medicine? They need to know if you have any of these conditions: -diabetes -gallbladder disease -heart disease -kidney disease -liver disease -thyroid disease -an unusual or allergic reaction to lanreotide, other medicines, foods, dyes, or preservatives -pregnant or trying to get pregnant -breast-feeding How should I use this medicine? This medicine is for injection under the skin. It is given by a health care professional in a hospital or clinic setting. Contact your pediatrician or health care professional regarding the use of this medicine in children. Special care may be needed. Overdosage: If you think you have taken too much of this medicine contact a poison control center or emergency room at once. NOTE: This medicine is only for you. Do not share this medicine with others. What if I miss a dose? It is important not to miss your dose. Call your doctor or health care professional if you are unable to keep an appointment. What may interact with this medicine? This medicine may interact with the following medications: -bromocriptine -cyclosporine -certain medicines for blood pressure, heart disease, irregular heart beat -certain medicines for diabetes -quinidine -terfenadine This list may not describe all possible interactions. Give your health care provider a list of all the medicines, herbs, non-prescription drugs, or dietary supplements you use. Also tell them if you smoke, drink alcohol, or  use illegal drugs. Some items may interact with your medicine. What should I watch for while using this medicine? Tell your doctor or healthcare professional if your symptoms do not start to get better or if they get worse. Visit your doctor or health care professional for regular checks on your progress. Your condition will be monitored carefully while you are receiving this medicine. You may need blood work done while you are taking this medicine. Women should inform their doctor if they wish to become pregnant or think they might be pregnant. There is a potential for serious side effects to an unborn child. Talk to your health care professional or pharmacist for more information. Do not breast-feed an infant while taking this medicine or for 6 months after stopping it. This medicine has caused ovarian failure in some women. This medicine may interfere with the ability to have a child. Talk with your doctor or health care professional if you are concerned about your fertility. What side effects may I notice from receiving this medicine? Side effects that you should report to your doctor or health care professional as soon as possible: -allergic reactions like skin rash, itching or hives, swelling of the face, lips, or tongue -increased blood pressure -severe stomach pain -signs and symptoms of high blood sugar such as dizziness; dry mouth; dry skin; fruity breath; nausea; stomach pain; increased hunger or thirst; increased urination -signs and symptoms of low blood sugar such as feeling anxious; confusion; dizziness; increased hunger; unusually weak or tired; sweating; shakiness; cold; irritable; headache; blurred vision; fast heartbeat; loss of consciousness -unusually slow heartbeat Side effects that usually do not require medical attention (report to your doctor or health care professional if they continue   or are bothersome): -constipation -diarrhea -dizziness -headache -muscle pain -muscle  spasms -nausea -pain, redness, or irritation at site where injected This list may not describe all possible side effects. Call your doctor for medical advice about side effects. You may report side effects to FDA at 1-800-FDA-1088. Where should I keep my medicine? This drug is given in a hospital or clinic and will not be stored at home. NOTE: This sheet is a summary. It may not cover all possible information. If you have questions about this medicine, talk to your doctor, pharmacist, or health care provider.  2018 Elsevier/Gold Standard (2016-09-08 10:33:47)  

## 2018-11-19 ENCOUNTER — Telehealth: Payer: Self-pay | Admitting: *Deleted

## 2018-11-19 ENCOUNTER — Other Ambulatory Visit: Payer: Self-pay | Admitting: *Deleted

## 2018-11-19 DIAGNOSIS — C786 Secondary malignant neoplasm of retroperitoneum and peritoneum: Secondary | ICD-10-CM | POA: Diagnosis not present

## 2018-11-19 DIAGNOSIS — Z431 Encounter for attention to gastrostomy: Secondary | ICD-10-CM | POA: Diagnosis not present

## 2018-11-19 DIAGNOSIS — C7A8 Other malignant neuroendocrine tumors: Secondary | ICD-10-CM | POA: Diagnosis not present

## 2018-11-19 DIAGNOSIS — I82502 Chronic embolism and thrombosis of unspecified deep veins of left lower extremity: Secondary | ICD-10-CM | POA: Diagnosis not present

## 2018-11-19 DIAGNOSIS — E43 Unspecified severe protein-calorie malnutrition: Secondary | ICD-10-CM | POA: Diagnosis not present

## 2018-11-19 DIAGNOSIS — E1165 Type 2 diabetes mellitus with hyperglycemia: Secondary | ICD-10-CM | POA: Diagnosis not present

## 2018-11-19 MED ORDER — NYSTATIN 100000 UNIT/ML MT SUSP: 5.0000 mL | Freq: Four times a day (QID) | OROMUCOSAL | 0 refills | Status: DC

## 2018-11-19 MED ORDER — METOCLOPRAMIDE HCL 5 MG PO TBDP: 5.0000 mg | ORAL_TABLET | Freq: Three times a day (TID) | ORAL | 0 refills | Status: DC

## 2018-11-19 NOTE — Telephone Encounter (Signed)
Call placed to patient caregiver [name].   Reports that FSBS have not been routinely elevated, only intermittently.  Also states that patient has had BM today, but it was more formed than liquid.   MD made aware and advised to not resume insulin. We will wait for A1C.

## 2018-11-19 NOTE — Telephone Encounter (Signed)
Restart Levemir 4 units at bedtime  Okay to get the A1C at next visit

## 2018-11-19 NOTE — Telephone Encounter (Signed)
Received call from Pattie, [facility] SN with [place] [phone]~ telephone.   Reports that patient weight today noted at 89.6lbs, and increase from 11/19 at 87.8lbs. Will also notify Dr. Barry Dienes.   Also states that FSBS noted at 249 this AM. Reports that patient states her FSBS will go up if she has not had BM in a few days. Patient states that she has no pain or nausea. HH SN also reports that she can obtain A1C on next visit if ok with MD.   MD please advise.

## 2018-11-22 ENCOUNTER — Telehealth: Payer: Self-pay | Admitting: *Deleted

## 2018-11-22 NOTE — Telephone Encounter (Signed)
Get an entire week  Or two of blood sugars so I can see a trend

## 2018-11-22 NOTE — Telephone Encounter (Signed)
Received call from patient niece, [name].   Reports that patient FSBS this AM noted at 267. States that FSBS on 11/21/2018 was 167.  Advised to continue to monitor and call with readings on Monday.

## 2018-11-25 NOTE — Telephone Encounter (Signed)
Call placed to patient and patient niece made aware.  Verbalized understanding.

## 2018-11-25 NOTE — Telephone Encounter (Signed)
Give the levemir 4 units at bedtime , send me blood sugar readings in 1 week

## 2018-11-25 NOTE — Telephone Encounter (Signed)
Received call from patient Niece, [name] with FSBS readings.   12/1 158 12/2 189 12/3 249 12/4 235 12/5 181 12/6 267 12/7 230 12/8 262 12/9 256

## 2018-11-27 DIAGNOSIS — I82502 Chronic embolism and thrombosis of unspecified deep veins of left lower extremity: Secondary | ICD-10-CM | POA: Diagnosis not present

## 2018-11-27 DIAGNOSIS — E43 Unspecified severe protein-calorie malnutrition: Secondary | ICD-10-CM | POA: Diagnosis not present

## 2018-11-27 DIAGNOSIS — E1165 Type 2 diabetes mellitus with hyperglycemia: Secondary | ICD-10-CM | POA: Diagnosis not present

## 2018-11-27 DIAGNOSIS — Z431 Encounter for attention to gastrostomy: Secondary | ICD-10-CM | POA: Diagnosis not present

## 2018-11-27 DIAGNOSIS — C7A8 Other malignant neuroendocrine tumors: Secondary | ICD-10-CM | POA: Diagnosis not present

## 2018-11-27 DIAGNOSIS — C786 Secondary malignant neoplasm of retroperitoneum and peritoneum: Secondary | ICD-10-CM | POA: Diagnosis not present

## 2018-11-29 ENCOUNTER — Other Ambulatory Visit: Payer: Self-pay | Admitting: Family Medicine

## 2018-12-02 ENCOUNTER — Telehealth: Payer: Self-pay | Admitting: *Deleted

## 2018-12-02 NOTE — Telephone Encounter (Signed)
Received call from patient niece, [name].   Reports that FSBS are as follows: 12/9 256 12/10 262 12/11 240 12/12 247 12/13 245 12/14 199 12/15 207 12/16 366  Patient is taking Levemir 4U at bedtime. She has also started the protein supplement which could be making her sugars jump.   MD please advise.

## 2018-12-02 NOTE — Telephone Encounter (Signed)
Increase levemir to  6 units

## 2018-12-03 ENCOUNTER — Telehealth: Payer: Self-pay | Admitting: *Deleted

## 2018-12-03 ENCOUNTER — Other Ambulatory Visit: Payer: Self-pay | Admitting: *Deleted

## 2018-12-03 DIAGNOSIS — Z431 Encounter for attention to gastrostomy: Secondary | ICD-10-CM | POA: Diagnosis not present

## 2018-12-03 DIAGNOSIS — C786 Secondary malignant neoplasm of retroperitoneum and peritoneum: Secondary | ICD-10-CM | POA: Diagnosis not present

## 2018-12-03 DIAGNOSIS — E43 Unspecified severe protein-calorie malnutrition: Secondary | ICD-10-CM | POA: Diagnosis not present

## 2018-12-03 DIAGNOSIS — C7A8 Other malignant neuroendocrine tumors: Secondary | ICD-10-CM | POA: Diagnosis not present

## 2018-12-03 DIAGNOSIS — E1165 Type 2 diabetes mellitus with hyperglycemia: Secondary | ICD-10-CM | POA: Diagnosis not present

## 2018-12-03 DIAGNOSIS — I82502 Chronic embolism and thrombosis of unspecified deep veins of left lower extremity: Secondary | ICD-10-CM | POA: Diagnosis not present

## 2018-12-03 IMAGING — DX DG CHEST 1V PORT
1 series · 1 of 1 positions shown · non-contrast
Comparison: 09/25/2017

CLINICAL DATA: Right-sided PICC line placement. Small bowel
resection.

EXAM:
PORTABLE CHEST 1 VIEW

[chest ap]
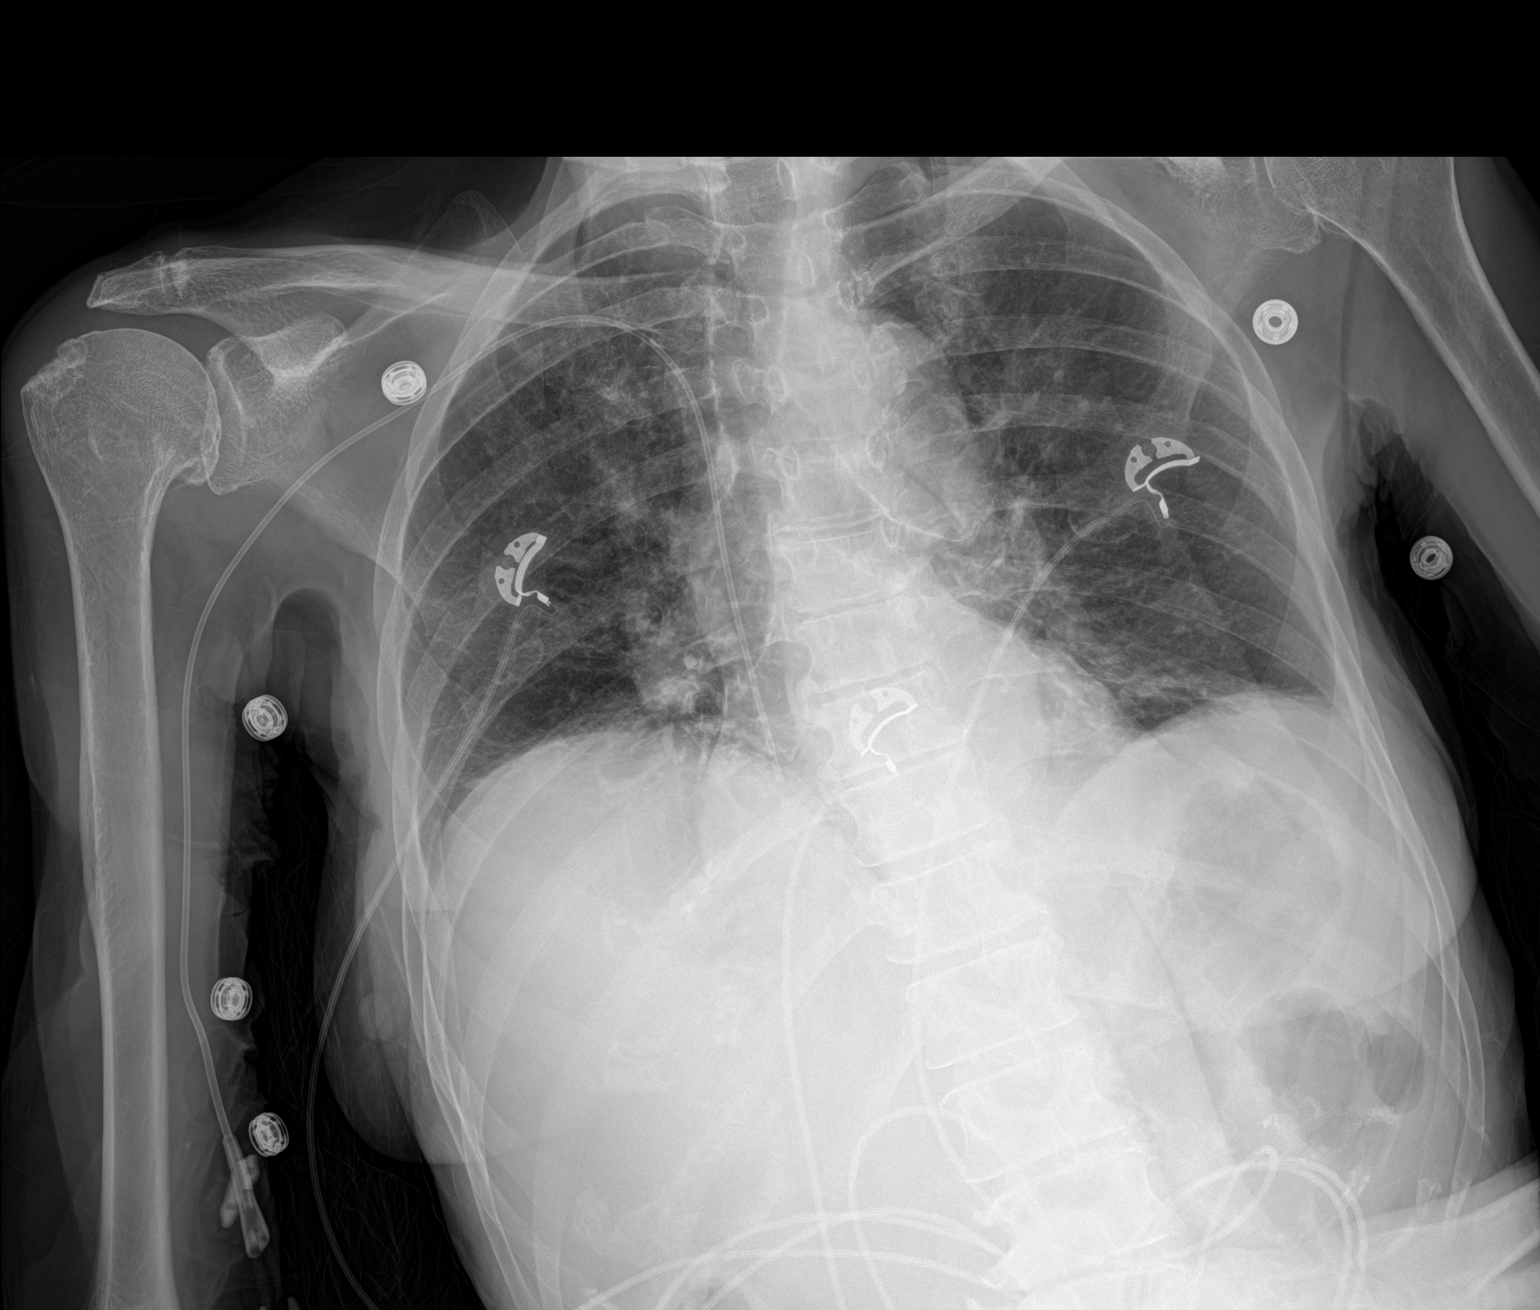

[1 of 1 positions shown; findings below may reference images not displayed]

FINDINGS: The heart size and mediastinal contours are within normal limits.
Right arm PICC line extends to the SVC/RA junction. Lungs show low
volumes with bibasilar atelectasis. There is no evidence of
pulmonary edema, consolidation, pneumothorax, nodule or pleural
fluid. The visualized skeletal structures are unremarkable.
IMPRESSION: PICC line tip lies at the SVC/RA junction.

## 2018-12-03 MED ORDER — METOCLOPRAMIDE HCL 5 MG PO TBDP: 5.0000 mg | ORAL_TABLET | Freq: Three times a day (TID) | ORAL | 0 refills | Status: DC

## 2018-12-03 MED ORDER — INSULIN DETEMIR 100 UNIT/ML ~~LOC~~ SOLN: 6.0000 [IU] | Freq: Every day | SUBCUTANEOUS | 1 refills | Status: DC

## 2018-12-03 MED ORDER — NYSTATIN 100000 UNIT/ML MT SUSP: 5.0000 mL | Freq: Four times a day (QID) | OROMUCOSAL | 0 refills | Status: DC

## 2018-12-03 NOTE — Telephone Encounter (Signed)
Call placed to patient and patient made aware.  

## 2018-12-03 NOTE — Telephone Encounter (Signed)
Call placed to patient and patient niece [name] made aware.

## 2018-12-03 NOTE — Telephone Encounter (Signed)
Patient niece [name] returned call and made aware.

## 2018-12-03 NOTE — Telephone Encounter (Signed)
Received call from Pattie, [facility] SN with [place] [phone]~ telephone.   Reports that patient weight today noted at 92.4lbs, and increase from 12/3 at 89.6lbs. Will also notify Dr. Barry Dienes.   States that FSBS are also running high. Advised that Levemir insulin has been increased to 6U at night.   Also states that patient continues to have thick white coating on tongue. Reports that niece did not get prescription for Nystatin. Will re-send.   Reports that patient temperature today noted at 99.2 (oral). States that patient reports she is coming down with a cold. Requested recommendations for treatment.

## 2018-12-03 NOTE — Telephone Encounter (Signed)
Keep hydrated, give robitussin DM for congestion or [place] Claritin once a day for nasal symptoms

## 2018-12-03 NOTE — Telephone Encounter (Signed)
Call placed to patient. [place].   Medication list updated.

## 2018-12-04 ENCOUNTER — Other Ambulatory Visit: Payer: Self-pay | Admitting: *Deleted

## 2018-12-04 MED ORDER — NYSTATIN 100000 UNIT/ML MT SUSP: 5.0000 mL | Freq: Four times a day (QID) | OROMUCOSAL | 0 refills | Status: DC

## 2018-12-10 ENCOUNTER — Other Ambulatory Visit: Payer: Self-pay

## 2018-12-10 ENCOUNTER — Encounter (HOSPITAL_BASED_OUTPATIENT_CLINIC_OR_DEPARTMENT_OTHER): Payer: Self-pay | Admitting: Emergency Medicine

## 2018-12-10 ENCOUNTER — Emergency Department (HOSPITAL_BASED_OUTPATIENT_CLINIC_OR_DEPARTMENT_OTHER)
Admission: EM | Admit: 2018-12-10 | Discharge: 2018-12-10 | Disposition: A | Discharge status: Home / Self Care | Payer: Medicare Other | Attending: Emergency Medicine | Admitting: Emergency Medicine

## 2018-12-10 DIAGNOSIS — F0390 Unspecified dementia without behavioral disturbance: Secondary | ICD-10-CM | POA: Diagnosis not present

## 2018-12-10 DIAGNOSIS — Z79899 Other long term (current) drug therapy: Secondary | ICD-10-CM | POA: Insufficient documentation

## 2018-12-10 DIAGNOSIS — Z794 Long term (current) use of insulin: Secondary | ICD-10-CM | POA: Insufficient documentation

## 2018-12-10 DIAGNOSIS — I10 Essential (primary) hypertension: Secondary | ICD-10-CM | POA: Diagnosis not present

## 2018-12-10 DIAGNOSIS — E1165 Type 2 diabetes mellitus with hyperglycemia: Secondary | ICD-10-CM | POA: Insufficient documentation

## 2018-12-10 DIAGNOSIS — Z85830 Personal history of malignant neoplasm of bone: Secondary | ICD-10-CM | POA: Insufficient documentation

## 2018-12-10 DIAGNOSIS — Z8505 Personal history of malignant neoplasm of liver: Secondary | ICD-10-CM | POA: Insufficient documentation

## 2018-12-10 DIAGNOSIS — Z7901 Long term (current) use of anticoagulants: Secondary | ICD-10-CM | POA: Insufficient documentation

## 2018-12-10 DIAGNOSIS — R739 Hyperglycemia, unspecified: Secondary | ICD-10-CM | POA: Diagnosis present

## 2018-12-10 LAB — URINALYSIS, MICROSCOPIC (REFLEX)

## 2018-12-10 LAB — CBC WITH DIFFERENTIAL/PLATELET
ABS IMMATURE GRANULOCYTES: 0.02 10*3/uL (ref 0.00–0.07)
BASOS PCT: 0 %
Basophils Absolute: 0 10*3/uL (ref 0.0–0.1)
Eosinophils Absolute: 0 10*3/uL (ref 0.0–0.5)
Eosinophils Relative: 0 %
HCT: 43.2 % (ref 36.0–46.0)
Hemoglobin: 13.3 g/dL (ref 12.0–15.0)
Immature Granulocytes: 0 %
Lymphocytes Relative: 16 %
Lymphs Abs: 0.7 10*3/uL (ref 0.7–4.0)
MCH: 28.4 pg (ref 26.0–34.0)
MCHC: 30.8 g/dL (ref 30.0–36.0)
MCV: 92.1 fL (ref 80.0–100.0)
Monocytes Absolute: 0.3 10*3/uL (ref 0.1–1.0)
Monocytes Relative: 7 %
Neutro Abs: 3.6 10*3/uL (ref 1.7–7.7)
Neutrophils Relative %: 77 %
PLATELETS: 334 10*3/uL (ref 150–400)
RBC: 4.69 MIL/uL (ref 3.87–5.11)
RDW: 14.4 % (ref 11.5–15.5)
WBC: 4.7 10*3/uL (ref 4.0–10.5)
nRBC: 0 % (ref 0.0–0.2)

## 2018-12-10 LAB — COMPREHENSIVE METABOLIC PANEL
ALT: 14 U/L (ref 0–44)
AST: 16 U/L (ref 15–41)
Albumin: 3.7 g/dL (ref 3.5–5.0)
Alkaline Phosphatase: 65 U/L (ref 38–126)
Anion gap: 10 (ref 5–15)
BUN: 36 mg/dL — ABNORMAL HIGH (ref 8–23)
CO2: 19 mmol/L — ABNORMAL LOW (ref 22–32)
Calcium: 9 mg/dL (ref 8.9–10.3)
Chloride: 106 mmol/L (ref 98–111)
Creatinine, Ser: 0.93 mg/dL (ref 0.44–1.00)
GFR calc Af Amer: >60 mL/min (ref >60)
GFR calc non Af Amer: 56 mL/min — ABNORMAL LOW (ref >60)
Glucose, Bld: 399 mg/dL — ABNORMAL HIGH (ref 70–99)
Potassium: 4.7 mmol/L (ref 3.5–5.1)
SODIUM: 135 mmol/L (ref 135–145)
Total Bilirubin: 0.5 mg/dL (ref 0.3–1.2)
Total Protein: 7.5 g/dL (ref 6.5–8.1)

## 2018-12-10 LAB — CBG MONITORING, ED
Glucose-Capillary: 413 mg/dL — ABNORMAL HIGH (ref 70–99)
Glucose-Capillary: 297 mg/dL — ABNORMAL HIGH (ref 70–99)

## 2018-12-10 LAB — URINALYSIS, ROUTINE W REFLEX MICROSCOPIC
BILIRUBIN URINE: NEGATIVE
Glucose, UA: >=500 mg/dL — AB
HGB URINE DIPSTICK: NEGATIVE
KETONES UR: NEGATIVE mg/dL
Leukocytes, UA: NEGATIVE
NITRITE: NEGATIVE
PH: 6 (ref 5.0–8.0)
Protein, ur: NEGATIVE mg/dL
Specific Gravity, Urine: 1.02 (ref 1.005–1.030)

## 2018-12-10 MED ORDER — SODIUM CHLORIDE 0.9 % IV BOLUS
1000.0000 mL | Freq: Once | INTRAVENOUS | Status: AC
  Administered 2018-12-10: 1000 mL via INTRAVENOUS

## 2018-12-10 NOTE — ED Notes (Signed)
NAD at this time. Pt is stable and going home.  

## 2018-12-10 NOTE — ED Triage Notes (Signed)
Pt from home with c/o blood sugar of 508. Pt took levimir last night. Family also reports that patient urine has a strong odor and urinary frequency.

## 2018-12-10 NOTE — ED Provider Notes (Signed)
[facility] EMERGENCY DEPARTMENT Provider Note   CSN: [id number] Arrival date & time: 12/10/18  1554     History   Chief Complaint Chief Complaint  Patient presents with  . Hyperglycemia  . Urinary Tract Infection    HPI Theresa Howe is a 84 y.o. female.  The history is provided by the patient and a caregiver.  Hyperglycemia  Blood sugar level PTA:  450 Severity:  Severe Onset quality:  Gradual Timing:  Constant Progression:  Worsening Chronicity:  Recurrent Diabetes status:  Controlled with insulin Current diabetic therapy:  Levemir 6units at night Context comment:  Recently changed cancer meds and blood sugar has been remaining elevated over the last month but normally in the 200's but today in the 4 and 500's Relieved by:  None tried Ineffective treatments:  Insulin Associated symptoms: increased thirst and polyuria   Associated symptoms: no abdominal pain, no altered mental status, no chest pain, no confusion, no fever, no nausea, no shortness of breath and no weakness   Associated symptoms comment:  Some dysuria, frequency and strong smelling urine over the last few days.  Tube feeds have not changed Urinary Tract Infection   Pertinent negatives include no nausea.    Past Medical History:  Diagnosis Date  . Abdominal mass 10/2017   SMALL BOWEL  . Diabetes mellitus    type 2  . Dyspnea   . Failure to thrive (0-17) 12/24/2017  . Glaucoma   . Hyperlipidemia   . Hypertension   . Neuropathy   . Osteoporosis   . Wheezing     Patient Active Problem List   Diagnosis Date Noted  . Metastasis to liver ([place]) 11/08/2018  . Metastasis to bone ([place]) 11/08/2018  . Dementia ([place]) 10/18/2018  . Hypomagnesemia 10/07/2018  . Hypokalemia 10/07/2018  . History of DVT (deep vein thrombosis) 09/26/2018  . Partial small bowel obstruction ([place]) 09/25/2018  . Advance care planning   . On total parenteral nutrition (TPN)   . Dysphagia   . Intra-abdominal  abscess ([place])   . Oral thrush   . Palliative care by specialist   . Goals of care, counseling/discussion   . 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 Adult failure to thrive 12/24/2017  . Protein-calorie malnutrition, severe 12/06/2017  . Small bowel mass 12/04/2017  . Protein-calorie malnutrition ([place]) 09/11/2017  . Weight loss 09/11/2017  . Diabetic neuropathy ([place]) 09/28/2015  . Hip pain 09/21/2014  . Muscle ache of extremity 06/12/2014  . Anxiety state 10/06/2013  . Bladder prolapse, female, acquired 08/03/2013  . Osteoporosis, unspecified 08/03/2013  . Nonspecific (abnormal) findings on radiological and other examination of biliary tract 04/17/2013  . Diabetes with neurologic complications ([place]) 07/68/0881  . HTN (hypertension) 09/18/2011  . Hyperlipidemia LDL goal <70 09/18/2011  . Glaucoma 09/18/2011    Past Surgical History:  Procedure Laterality Date  . ABDOMINAL HYSTERECTOMY    . CATARACT EXTRACTION Right 01/2010  . COLON SURGERY    . COLONOSCOPY    . EUS N/A 04/17/2013   Procedure: UPPER ENDOSCOPIC ULTRASOUND (EUS) LINEAR;  Surgeon: Milus Banister, MD;  Location: [facility] ENDOSCOPY;  Service: Endoscopy;  Laterality: N/A;  . GLAUCOMA SURGERY  2007  . IR CM INJ ANY COLONIC TUBE W/FLUORO  09/12/2018  . IR FLUORO GUIDE CV LINE RIGHT  01/17/2018  . IR GASTROSTOMY TUBE MOD SED  04/11/2018  . IR IMAGE GUIDED DRAINAGE PERCUT CATH  PERITONEAL RETROPERIT  01/09/2018  . IR REMOVAL  TUN CV CATH W/O FL  06/19/2018  . IR SINUS/FIST TUBE CHK-NON GI  01/18/2018  . IR US GUIDE VASC ACCESS RIGHT  01/17/2018  . LAPAROSCOPIC SMALL BOWEL RESECTION  12/04/2017  . LAPAROSCOPIC SMALL BOWEL RESECTION N/A 12/04/2017   Procedure: LAPAROSCOPIC SMALL BOWEL RESECTION;  Surgeon: Stark Klein, MD;  Location: [facility];  Service: General;  Laterality: N/A;  . LAPAROSCOPY N/A 12/04/2017   Procedure: LAPAROSCOPY DIAGNOSTIC;  Surgeon: Stark Klein, MD;  Location: [facility];  Service:  General;  Laterality: N/A;  . LAPAROSCOPY N/A 12/27/2017   Procedure: DIAGNOSTIC LAPAROSCOPY/ LAPAROTOMY WITH SMALL BOWEL RESECTION TIMES TWO;  Surgeon: Stark Klein, MD;  Location: [facility];  Service: General;  Laterality: N/A;  . MULTIPLE TOOTH EXTRACTIONS    . PICC LINE INSERTION  12/24/2017     OB History   No obstetric history on file.      Home Medications    Prior to Admission medications   Medication Sig Start Date End Date Taking? Authorizing Provider  acetaminophen (TYLENOL) 325 MG tablet Take 650 mg by mouth every 6 (six) hours as needed for mild pain, moderate pain or headache.     [provider]  apixaban (ELIQUIS) 2.5 MG TABS tablet Take 2.5 mg by mouth 2 (two) times daily.    [provider]  Blood Glucose Monitoring Suppl (BLOOD GLUCOSE SYSTEM PAK) KIT Please dispense based on patient and insurance preference. Use as directed to monitor FSBS 1x daily. Dx: E11.9. 09/03/18   Delsa Grana, PA-C  Glucose Blood (BLOOD GLUCOSE TEST STRIPS) STRP Please dispense based on patient and insurance preference. Use as directed to monitor FSBS 1x daily. Dx: E11.9. 09/03/18   Delsa Grana, PA-C  insulin detemir (LEVEMIR) 100 UNIT/ML injection Inject 0.06 mLs (6 Units total) into the skin at bedtime. 12/03/18   Alycia Rossetti, MD  Insulin Pen Needle (BD PEN NEEDLE MICRO U/F) 32G X 6 MM MISC Use with insulin pen ICD10:e11.43 09/16/18   Alycia Rossetti, MD  Lancets MISC Please dispense based on patient and insurance preference. Use as directed to monitor FSBS 1x daily. Dx: E11.9. 09/03/18   Delsa Grana, PA-C  Metoclopramide HCl 5 MG TBDP Take 1 tablet (5 mg total) by mouth 3 (three) times daily. 12/03/18   C[place], Modena Nunnery, MD  nystatin (MYCOSTATIN) 100000 UNIT/ML suspension Take 5 mLs (500,000 Units total) by mouth 4 (four) times daily. As needed. Swish and spit. 12/04/18   [place], Modena Nunnery, MD  ondansetron (ZOFRAN) 4 MG tablet Take 4 mg by mouth every 8 (eight) hours as  needed for nausea or vomiting.    [provider]  pantoprazole (PROTONIX) 40 MG tablet Take 40 mg by mouth daily.    [provider]    Family History No family history on file.  Social History Social History   Tobacco Use  . Smoking status: Never Smoker  . Smokeless tobacco: Never Used  Substance Use Topics  . Alcohol use: No  . Drug use: No     Allergies   Patient has no known allergies.   Review of Systems Review of Systems  Constitutional: Negative for fever.  Respiratory: Negative for shortness of breath.   Cardiovascular: Negative for chest pain.  Gastrointestinal: Negative for abdominal pain and nausea.  Endocrine: Positive for polydipsia and polyuria.  Neurological: Negative for weakness.  Psychiatric/Behavioral: Negative for confusion.  All other systems reviewed and are negative.    Physical Exam Updated Vital Signs BP (!) 148/79 (  BP Location: Right Arm)   Pulse 77   Temp 98.2 F (36.8 C) (Oral)   Resp 18   Ht 5' 6" (1.676 m)   Wt 39.9 kg   SpO2 100%   BMI 14.20 kg/m   Physical Exam Vitals signs and nursing note reviewed.  Constitutional:      General: She is not in acute distress.    Appearance: Normal appearance. She is well-groomed. She is cachectic.  HENT:     Head: Normocephalic and atraumatic.  Eyes:     Pupils: Pupils are equal, round, and reactive to light.  Cardiovascular:     Rate and Rhythm: Normal rate and regular rhythm.     Heart sounds: Normal heart sounds. No murmur. No friction rub.  Pulmonary:     Effort: Pulmonary effort is normal.     Breath sounds: Normal breath sounds. No wheezing or rales.  Abdominal:     General: Bowel sounds are normal. There is no distension.     Palpations: Abdomen is soft.     Tenderness: There is no abdominal tenderness. There is no guarding or rebound.     Comments: Peg tube present in the LUQ  Musculoskeletal: Normal range of motion.        General: No tenderness.      Comments: No edema  Skin:    General: Skin is warm and dry.     Findings: No rash.  Neurological:     Mental Status: She is alert. Mental status is at baseline.  Psychiatric:        Behavior: Behavior normal.      ED Treatments / Results  Labs (all labs ordered are listed, but only abnormal results are displayed) Labs Reviewed  URINALYSIS, ROUTINE W REFLEX MICROSCOPIC - Abnormal; Notable for the following components:      Result Value   Glucose, UA >=500 (*)    All other components within normal limits  COMPREHENSIVE METABOLIC PANEL - Abnormal; Notable for the following components:   CO2 19 (*)    Glucose, Bld 399 (*)    BUN 36 (*)    GFR calc non Af Amer 56 (*)    All other components within normal limits  URINALYSIS, MICROSCOPIC (REFLEX) - Abnormal; Notable for the following components:   Bacteria, UA RARE (*)    All other components within normal limits  CBG MONITORING, ED - Abnormal; Notable for the following components:   Glucose-Capillary 413 (*)    All other components within normal limits  CBG MONITORING, ED - Abnormal; Notable for the following components:   Glucose-Capillary 297 (*)    All other components within normal limits  URINE CULTURE  CBC WITH DIFFERENTIAL/PLATELET    EKG None  Radiology No results found.  Procedures Procedures (including critical care time)  Medications Ordered in ED Medications  sodium chloride 0.9 % bolus 1,000 mL (1,000 mLs Intravenous New Bag/Given 12/10/18 1701)     Initial Impression / Assessment and Plan / ED Course  I have reviewed the triage vital signs and the nursing notes.  Pertinent labs & imaging results that were available during my care of the patient were reviewed by me and considered in my medical decision making (see chart for details).     Patient with multiple medical problems who is currently undergoing chemotherapy for cancer and has a PEG feeding tube presenting today for elevated blood sugars and  some dysuria.  Caregiver states that for the entire month of December her  blood sugars have been running in the 200s and recently increased Levemir to 60 units at night.  That is the only diabetic medication she takes.  Her tube feeds have not changed and she does not eat so is not getting sugar by mouth.  They spoke with her doctor today who was concerned she may have a urinary tract infection and that is why her blood sugar is running higher which was 500 at home.  Here patient's blood sugar was 400 and she was given 1 L of fluid.  Labs are reassuring with a normal white count and hemoglobin, threatening is at baseline and patient has no significant findings for UTI.  After 1 L bolus patient's blood sugar is now 297.  Encouraged caregiver to contact her doctor to see if they need to increase the Levemir to 8 units at night and to continue doing oral water as well as her tube feeds.  Final Clinical Impressions(s) / ED Diagnoses   Final diagnoses:  Hyperglycemia    ED Discharge Orders    None       Blanchie Dessert, MD 12/10/18 [phone]

## 2018-12-12 ENCOUNTER — Telehealth: Payer: Self-pay | Admitting: Family Medicine

## 2018-12-12 LAB — URINE CULTURE

## 2018-12-12 MED ORDER — INSULIN DETEMIR 100 UNIT/ML ~~LOC~~ SOLN: 8.0000 [IU] | Freq: Every day | SUBCUTANEOUS | 1 refills | Status: DC

## 2018-12-12 NOTE — Telephone Encounter (Signed)
Call [name]   I saw the ER report from where I sent her for blood sugar of 500. Her urine culture came back negative  They may need to give her more free water which will help her sugar as well.  Increase Levemir to 8 units Check CBG fasting Mid-day and before Bedtime Call Monday with CBG readings

## 2018-12-12 NOTE — Addendum Note (Signed)
Addended by: Sheral Flow on: 12/12/2018 12:11 PM   Modules accepted: Orders

## 2018-12-12 NOTE — Telephone Encounter (Signed)
Call placed to patient and patient niece made aware.   Reports that she can give more fluids with the flushes daily, and patient will be given more free water/ ice to drink.   Reports that she does have some vomiting, but only when taking medications or using nystatin mouthwash. States that she does not have any gagging or regurgitation with only water or ice. States that she has vomiting almost daily with her medications.   Requested MD to advise.

## 2018-12-13 ENCOUNTER — Inpatient Hospital Stay: Payer: Medicare Other | Attending: Hematology

## 2018-12-13 VITALS — BP 136/82 | HR 78 | Temp 97.8°F | Resp 20

## 2018-12-13 DIAGNOSIS — Z79899 Other long term (current) drug therapy: Secondary | ICD-10-CM | POA: Diagnosis not present

## 2018-12-13 DIAGNOSIS — C7A8 Other malignant neuroendocrine tumors: Secondary | ICD-10-CM

## 2018-12-13 MED ORDER — LANREOTIDE ACETATE 120 MG/0.5ML ~~LOC~~ SOLN
SUBCUTANEOUS | Status: AC
  Filled 2018-12-13: qty 120

## 2018-12-13 MED ORDER — LANREOTIDE ACETATE 120 MG/0.5ML ~~LOC~~ SOLN
120.0000 mg | Freq: Once | SUBCUTANEOUS | Status: AC
  Administered 2018-12-13: 120 mg via SUBCUTANEOUS

## 2018-12-13 NOTE — Telephone Encounter (Signed)
Call placed to patient and patient niece [name] made aware per VM.

## 2018-12-13 NOTE — Telephone Encounter (Signed)
Give her the zofran, if this is not working can call in phenergan 12.5mg  every 6 hours as needed Get her a F/U in the office next week

## 2018-12-16 ENCOUNTER — Telehealth: Payer: Self-pay | Admitting: Family Medicine

## 2018-12-16 ENCOUNTER — Other Ambulatory Visit: Payer: Self-pay | Admitting: Family Medicine

## 2018-12-16 ENCOUNTER — Encounter: Payer: Self-pay | Admitting: Family Medicine

## 2018-12-16 ENCOUNTER — Other Ambulatory Visit: Payer: Self-pay

## 2018-12-16 ENCOUNTER — Emergency Department (HOSPITAL_BASED_OUTPATIENT_CLINIC_OR_DEPARTMENT_OTHER)
Admission: EM | Admit: 2018-12-16 | Discharge: 2018-12-17 | Disposition: A | Discharge status: Home / Self Care | Payer: Medicare Other | Attending: Emergency Medicine | Admitting: Emergency Medicine

## 2018-12-16 ENCOUNTER — Encounter (HOSPITAL_BASED_OUTPATIENT_CLINIC_OR_DEPARTMENT_OTHER): Payer: Self-pay | Admitting: *Deleted

## 2018-12-16 ENCOUNTER — Ambulatory Visit (INDEPENDENT_AMBULATORY_CARE_PROVIDER_SITE_OTHER): Payer: Medicare Other | Admitting: Family Medicine

## 2018-12-16 VITALS — BP 118/68 | HR 90 | Temp 97.6°F | Resp 16 | Ht 64.0 in | Wt 84.1 lb

## 2018-12-16 DIAGNOSIS — Z79899 Other long term (current) drug therapy: Secondary | ICD-10-CM | POA: Diagnosis not present

## 2018-12-16 DIAGNOSIS — Z794 Long term (current) use of insulin: Secondary | ICD-10-CM | POA: Insufficient documentation

## 2018-12-16 DIAGNOSIS — R739 Hyperglycemia, unspecified: Secondary | ICD-10-CM

## 2018-12-16 DIAGNOSIS — B370 Candidal stomatitis: Secondary | ICD-10-CM | POA: Diagnosis not present

## 2018-12-16 DIAGNOSIS — I10 Essential (primary) hypertension: Secondary | ICD-10-CM | POA: Insufficient documentation

## 2018-12-16 DIAGNOSIS — R112 Nausea with vomiting, unspecified: Secondary | ICD-10-CM | POA: Diagnosis not present

## 2018-12-16 DIAGNOSIS — F0390 Unspecified dementia without behavioral disturbance: Secondary | ICD-10-CM | POA: Diagnosis not present

## 2018-12-16 DIAGNOSIS — C7951 Secondary malignant neoplasm of bone: Secondary | ICD-10-CM

## 2018-12-16 DIAGNOSIS — E1143 Type 2 diabetes mellitus with diabetic autonomic (poly)neuropathy: Secondary | ICD-10-CM | POA: Diagnosis not present

## 2018-12-16 DIAGNOSIS — C7A8 Other malignant neuroendocrine tumors: Secondary | ICD-10-CM | POA: Diagnosis not present

## 2018-12-16 DIAGNOSIS — E1165 Type 2 diabetes mellitus with hyperglycemia: Secondary | ICD-10-CM | POA: Insufficient documentation

## 2018-12-16 DIAGNOSIS — E860 Dehydration: Secondary | ICD-10-CM | POA: Diagnosis not present

## 2018-12-16 LAB — I-STAT VENOUS BLOOD GAS, ED
Bicarbonate: 24.5 mmol/L (ref 20.0–28.0)
O2 Saturation: 83 %
TCO2: 26 mmol/L (ref 22–32)
pCO2, Ven: 39.5 mmHg — ABNORMAL LOW (ref 44.0–60.0)
pH, Ven: 7.401 (ref 7.250–7.430)
pO2, Ven: 48 mmHg — ABNORMAL HIGH (ref 32.0–45.0)

## 2018-12-16 LAB — URINALYSIS, MICROSCOPIC (REFLEX)

## 2018-12-16 LAB — CBC WITH DIFFERENTIAL/PLATELET
Abs Immature Granulocytes: 0.01 10*3/uL (ref 0.00–0.07)
Basophils Absolute: 0 10*3/uL (ref 0.0–0.1)
Basophils Relative: 0 %
Eosinophils Absolute: 0 10*3/uL (ref 0.0–0.5)
Eosinophils Relative: 1 %
HCT: 47.1 % — ABNORMAL HIGH (ref 36.0–46.0)
Hemoglobin: 14.8 g/dL (ref 12.0–15.0)
Immature Granulocytes: 0 %
Lymphocytes Relative: 19 %
Lymphs Abs: 1 10*3/uL (ref 0.7–4.0)
MCH: 28.7 pg (ref 26.0–34.0)
MCHC: 31.4 g/dL (ref 30.0–36.0)
MCV: 91.5 fL (ref 80.0–100.0)
Monocytes Absolute: 0.4 10*3/uL (ref 0.1–1.0)
Monocytes Relative: 7 %
Neutro Abs: 3.7 10*3/uL (ref 1.7–7.7)
Neutrophils Relative %: 73 %
Platelets: 304 10*3/uL (ref 150–400)
RBC: 5.15 MIL/uL — AB (ref 3.87–5.11)
RDW: 13.7 % (ref 11.5–15.5)
WBC: 5.1 10*3/uL (ref 4.0–10.5)
nRBC: 0 % (ref 0.0–0.2)

## 2018-12-16 LAB — COMPREHENSIVE METABOLIC PANEL
ALT: 12 U/L (ref 0–44)
ANION GAP: 11 (ref 5–15)
AST: 21 U/L (ref 15–41)
Albumin: 3.8 g/dL (ref 3.5–5.0)
Alkaline Phosphatase: 75 U/L (ref 38–126)
BUN: 38 mg/dL — ABNORMAL HIGH (ref 8–23)
CO2: 21 mmol/L — ABNORMAL LOW (ref 22–32)
Calcium: 9.6 mg/dL (ref 8.9–10.3)
Chloride: 100 mmol/L (ref 98–111)
Creatinine, Ser: 0.78 mg/dL (ref 0.44–1.00)
GFR calc Af Amer: >60 mL/min (ref >60)
GFR calc non Af Amer: >60 mL/min (ref >60)
Glucose, Bld: 371 mg/dL — ABNORMAL HIGH (ref 70–99)
Potassium: 5.2 mmol/L — ABNORMAL HIGH (ref 3.5–5.1)
Sodium: 132 mmol/L — ABNORMAL LOW (ref 135–145)
Total Bilirubin: 0.6 mg/dL (ref 0.3–1.2)
Total Protein: 8.1 g/dL (ref 6.5–8.1)

## 2018-12-16 LAB — I-STAT CG4 LACTIC ACID, ED
Lactic Acid, Venous: 2.01 mmol/L (ref 0.5–1.9)
Lactic Acid, Venous: 1.99 mmol/L — ABNORMAL HIGH (ref 0.5–1.9)

## 2018-12-16 LAB — URINALYSIS, ROUTINE W REFLEX MICROSCOPIC
Bilirubin Urine: NEGATIVE
Glucose, UA: >=500 mg/dL — AB
HGB URINE DIPSTICK: NEGATIVE
Ketones, ur: NEGATIVE mg/dL
Nitrite: NEGATIVE
Protein, ur: NEGATIVE mg/dL
Specific Gravity, Urine: 1.02 (ref 1.005–1.030)
pH: 5.5 (ref 5.0–8.0)

## 2018-12-16 LAB — CBG MONITORING, ED: Glucose-Capillary: 259 mg/dL — ABNORMAL HIGH (ref 70–99)

## 2018-12-16 LAB — LIPASE, BLOOD: LIPASE: 45 U/L (ref 11–51)

## 2018-12-16 MED ORDER — METOCLOPRAMIDE HCL 5 MG PO TBDP: 5.0000 mg | ORAL_TABLET | Freq: Three times a day (TID) | ORAL | 0 refills | Status: DC

## 2018-12-16 MED ORDER — SODIUM CHLORIDE 0.9 % IV BOLUS
1000.0000 mL | Freq: Once | INTRAVENOUS | Status: AC
  Administered 2018-12-16: 1000 mL via INTRAVENOUS

## 2018-12-16 MED ORDER — PROMETHAZINE HCL 12.5 MG RE SUPP: 12.5000 mg | Freq: Four times a day (QID) | RECTAL | 2 refills | Status: DC | PRN

## 2018-12-16 MED ORDER — ONDANSETRON 4 MG PO TBDP: 4.0000 mg | ORAL_TABLET | Freq: Three times a day (TID) | ORAL | 2 refills | Status: DC | PRN

## 2018-12-16 MED ORDER — FLUCONAZOLE 100 MG PO TABS: 100.0000 mg | ORAL_TABLET | Freq: Every day | ORAL | 0 refills | Status: DC

## 2018-12-16 MED ORDER — RANITIDINE HCL 15 MG/ML PO SYRP: 150.0000 mg | ORAL_SOLUTION | Freq: Two times a day (BID) | ORAL | 6 refills | Status: DC

## 2018-12-16 MED ORDER — ONDANSETRON HCL 4 MG/2ML IJ SOLN
4.0000 mg | Freq: Once | INTRAMUSCULAR | Status: AC
  Administered 2018-12-16: 4 mg via INTRAVENOUS
  Filled 2018-12-16: qty 2

## 2018-12-16 NOTE — ED Triage Notes (Signed)
She was in the doctors office today and they were unable to draw her blood due to poor veins. Family was advised to bring her here for hyperglycemia.

## 2018-12-16 NOTE — ED Notes (Signed)
Pt vomited large amount of emesis liquid with yellow curd like substance noted  Pt cleaned and linens changed

## 2018-12-16 NOTE — ED Provider Notes (Signed)
[facility] EMERGENCY DEPARTMENT Provider Note   CSN: [id number] Arrival date & time: 12/16/18  1504     History   Chief Complaint Chief Complaint  Patient presents with  . Hyperglycemia    HPI Theresa Howe is a 84 y.o. female.  84 year old female with past medical history including metastatic neuroendocrine tumor of small intestine, dementia, G-tube, IDDM who presents with hyperglycemia and dehydration.  Patient presented here on 12/24 with hyperglycemia.  She was given IV fluids and discharged with instructions to follow-up with PCP regarding her insulin regimen.  She saw her PCP in the clinic today where she was noted to have a blood glucose of 530.  They tried to start an IV to give her fluids but were unsuccessful and sent her here for IV fluids.  She has had problems with vomiting while trying to swallow some of her medications and PCP has adjusted several medications including switching nystatin to Diflucan, PPI to liquid Zantac, and increasing levemir.  Daughter reports that the vomiting is always associated with trying to administer medication.  She has had no fevers or complaints of pain.  Currently the patient denies any complaints.  The history is provided by the patient and a relative.  Hyperglycemia    Past Medical History:  Diagnosis Date  . Abdominal mass 10/2017   SMALL BOWEL  . Diabetes mellitus    type 2  . Dyspnea   . Failure to thrive (0-17) 12/24/2017  . Glaucoma   . Hyperlipidemia   . Hypertension   . Neuropathy   . Osteoporosis   . Wheezing     Patient Active Problem List   Diagnosis Date Noted  . Metastasis to liver ([place]) 11/08/2018  . Metastasis to bone ([place]) 11/08/2018  . Dementia ([place]) 10/18/2018  . Hypomagnesemia 10/07/2018  . Hypokalemia 10/07/2018  . History of DVT (deep vein thrombosis) 09/26/2018  . Partial small bowel obstruction ([place]) 09/25/2018  . Advance care planning   . On total parenteral nutrition (TPN)   .  Dysphagia   . Intra-abdominal abscess ([place])   . Oral thrush   . Palliative care by specialist   . Goals of care, counseling/discussion   . Primary malignant neuroendocrine tumor of small intestine ([place]) 01/17/2018  . Adult failure to thrive 12/24/2017  . Protein-calorie malnutrition, severe 12/06/2017  . Small bowel mass 12/04/2017  . Protein-calorie malnutrition ([place]) 09/11/2017  . Weight loss 09/11/2017  . Diabetic neuropathy ([place]) 09/28/2015  . Hip pain 09/21/2014  . Muscle ache of extremity 06/12/2014  . Anxiety state 10/06/2013  . Bladder prolapse, female, acquired 08/03/2013  . Osteoporosis, unspecified 08/03/2013  . Nonspecific (abnormal) findings on radiological and other examination of biliary tract 04/17/2013  . Diabetes with neurologic complications ([place]) 95/63/8756  . HTN (hypertension) 09/18/2011  . Hyperlipidemia LDL goal <70 09/18/2011  . Glaucoma 09/18/2011    Past Surgical History:  Procedure Laterality Date  . ABDOMINAL HYSTERECTOMY    . CATARACT EXTRACTION Right 01/2010  . COLON SURGERY    . COLONOSCOPY    . EUS N/A 04/17/2013   Procedure: UPPER ENDOSCOPIC ULTRASOUND (EUS) LINEAR;  Surgeon: Milus Banister, MD;  Location: [facility];  Service: Endoscopy;  Laterality: N/A;  . GLAUCOMA SURGERY  2007  . IR CM INJ ANY COLONIC TUBE W/FLUORO  09/12/2018  . IR FLUORO GUIDE CV LINE RIGHT  01/17/2018  . IR GASTROSTOMY TUBE MOD SED  04/11/2018  . IR IMAGE GUIDED DRAINAGE PERCUT CATH  PERITONEAL RETROPERIT  01/09/2018  . IR REMOVAL TUN CV CATH W/O FL  06/19/2018  . IR SINUS/FIST TUBE CHK-NON GI  01/18/2018  . IR US GUIDE VASC ACCESS RIGHT  01/17/2018  . LAPAROSCOPIC SMALL BOWEL RESECTION  12/04/2017  . LAPAROSCOPIC SMALL BOWEL RESECTION N/A 12/04/2017   Procedure: LAPAROSCOPIC SMALL BOWEL RESECTION;  Surgeon: Stark Klein, MD;  Location: [facility];  Service: General;  Laterality: N/A;  . LAPAROSCOPY N/A 12/04/2017   Procedure: LAPAROSCOPY DIAGNOSTIC;  Surgeon: Stark Klein,  MD;  Location: [facility];  Service: General;  Laterality: N/A;  . LAPAROSCOPY N/A 12/27/2017   Procedure: DIAGNOSTIC LAPAROSCOPY/ LAPAROTOMY WITH SMALL BOWEL RESECTION TIMES TWO;  Surgeon: Stark Klein, MD;  Location: [facility];  Service: General;  Laterality: N/A;  . MULTIPLE TOOTH EXTRACTIONS    . PICC LINE INSERTION  12/24/2017     OB History   No obstetric history on file.      Home Medications    Prior to Admission medications   Medication Sig Start Date End Date Taking? Authorizing Provider  acetaminophen (TYLENOL) 325 MG tablet Take 650 mg by mouth every 6 (six) hours as needed for mild pain, moderate pain or headache.     [provider]  Blood Glucose Monitoring Suppl (BLOOD GLUCOSE SYSTEM PAK) KIT Please dispense based on patient and insurance preference. Use as directed to monitor FSBS 1x daily. Dx: E11.9. 09/03/18   Delsa Grana, PA-C  ELIQUIS 2.5 MG TABS tablet TAKE 1 TABLET BY MOUTH TWICE A DAY 12/16/18   Susy Frizzle, MD  fluconazole (DIFLUCAN) 100 MG tablet Take 1 tablet (100 mg total) by mouth daily. FOR 14 DAYS, CRUSH 12/16/18   Alycia Rossetti, MD  Glucose Blood (BLOOD GLUCOSE TEST STRIPS) STRP Please dispense based on patient and insurance preference. Use as directed to monitor FSBS 1x daily. Dx: E11.9. 09/03/18   Delsa Grana, PA-C  insulin detemir (LEVEMIR) 100 UNIT/ML injection Inject 0.08 mLs (8 Units total) into the skin at bedtime. 12/12/18   Alycia Rossetti, MD  Insulin Pen Needle (BD PEN NEEDLE MICRO U/F) 32G X 6 MM MISC Use with insulin pen ICD10:e11.43 09/16/18   Alycia Rossetti, MD  Lancets MISC Please dispense based on patient and insurance preference. Use as directed to monitor FSBS 1x daily. Dx: E11.9. 09/03/18   Delsa Grana, PA-C  Metoclopramide HCl 5 MG TBDP Take 1 tablet (5 mg total) by mouth 3 (three) times daily. 12/16/18   [place], Modena Nunnery, MD  nystatin (MYCOSTATIN) 100000 UNIT/ML suspension Take 5 mLs (500,000 Units total) by mouth 4 (four)  times daily. As needed. Swish and spit. 12/04/18   [place], Modena Nunnery, MD  ondansetron (ZOFRAN ODT) 4 MG disintegrating tablet Take 1 tablet (4 mg total) by mouth every 8 (eight) hours as needed for nausea or vomiting. 12/16/18   , Modena Nunnery, MD  ondansetron (ZOFRAN) 4 MG tablet Take 4 mg by mouth every 8 (eight) hours as needed for nausea or vomiting.    [provider]  pantoprazole (PROTONIX) 40 MG tablet TAKE 1 TABLET BY MOUTH EVERY DAY 12/16/18   Susy Frizzle, MD  promethazine (PHENERGAN) 12.5 MG suppository Place 1 suppository (12.5 mg total) rectally every 6 (six) hours as needed for nausea or vomiting. 12/16/18   Alycia Rossetti, MD  ranitidine (ZANTAC) 15 MG/ML syrup Take 10 mLs (150 mg total) by mouth 2 (two) times daily. 12/16/18   Alycia Rossetti, MD    Family History No family history  on file.  Social History Social History   Tobacco Use  . Smoking status: Never Smoker  . Smokeless tobacco: Never Used  Substance Use Topics  . Alcohol use: No  . Drug use: No     Allergies   Patient has no known allergies.   Review of Systems Review of Systems  Unable to perform ROS: Dementia     Physical Exam Updated Vital Signs BP 122/69   Pulse 74   Temp 97.9 F (36.6 C) (Oral)   Resp 17   Ht '5\' 4"'  (1.626 m)   Wt 38.1 kg   SpO2 99%   BMI 14.42 kg/m   Physical Exam Vitals signs and nursing note reviewed.  Constitutional:      General: She is not in acute distress.    Appearance: She is well-developed.     Comments: Frail, cachectic, chronically ill-appearing but awake and comfortable  HENT:     Head: Normocephalic and atraumatic.     Mouth/Throat:     Comments: Mucous membranes, thrush on tongue Eyes:     Conjunctiva/sclera: Conjunctivae normal.  Neck:     Musculoskeletal: Neck supple.  Cardiovascular:     Rate and Rhythm: Normal rate and regular rhythm.     Heart sounds: Normal heart sounds. No murmur.  Pulmonary:     Effort:  Pulmonary effort is normal.     Breath sounds: Normal breath sounds.  Abdominal:     General: Bowel sounds are normal. There is no distension.     Palpations: Abdomen is soft.     Tenderness: There is no abdominal tenderness.  Skin:    General: Skin is warm and dry.  Neurological:     Mental Status: She is alert.     Comments: Oriented to person and place, following commands  Psychiatric:        Mood and Affect: Mood normal.      ED Treatments / Results  Labs (all labs ordered are listed, but only abnormal results are displayed) Labs Reviewed  COMPREHENSIVE METABOLIC PANEL - Abnormal; Notable for the following components:      Result Value   Sodium 132 (*)    Potassium 5.2 (*)    CO2 21 (*)    Glucose, Bld 371 (*)    BUN 38 (*)    All other components within normal limits  CBC WITH DIFFERENTIAL/PLATELET - Abnormal; Notable for the following components:   RBC 5.15 (*)    HCT 47.1 (*)    All other components within normal limits  URINALYSIS, ROUTINE W REFLEX MICROSCOPIC - Abnormal; Notable for the following components:   Glucose, UA >=500 (*)    Leukocytes, UA SMALL (*)    All other components within normal limits  URINALYSIS, MICROSCOPIC (REFLEX) - Abnormal; Notable for the following components:   Bacteria, UA FEW (*)    All other components within normal limits  I-STAT CG4 LACTIC ACID, ED - Abnormal; Notable for the following components:   Lactic Acid, Venous 2.01 (*)    All other components within normal limits  I-STAT VENOUS BLOOD GAS, ED - Abnormal; Notable for the following components:   pCO2, Ven 39.5 (*)    pO2, Ven 48.0 (*)    All other components within normal limits  I-STAT CG4 LACTIC ACID, ED - Abnormal; Notable for the following components:   Lactic Acid, Venous 1.99 (*)    All other components within normal limits  CBG MONITORING, ED - Abnormal; Notable for the following  components:   Glucose-Capillary 259 (*)    All other components within normal  limits  LIPASE, BLOOD    EKG None  Radiology No results found.  Procedures Procedures (including critical care time)  Medications Ordered in ED Medications  sodium chloride 0.9 % bolus 1,000 mL (0 mLs Intravenous Stopped 12/16/18 1700)  ondansetron (ZOFRAN) injection 4 mg (4 mg Intravenous Given 12/16/18 2108)     Initial Impression / Assessment and Plan / ED Course  I have reviewed the triage vital signs and the nursing notes.  Pertinent labs & imaging results that were available during my care of the patient were reviewed by me and considered in my medical decision making (see chart for details).    Nontoxic on exam with stable vital signs.  No abdominal tenderness or complaints of pain.  Place IV and gave fluid bolus. BG improved from 371 to 259 with fluids. She had 1 episode of vomiting, which daughter stated was not unusual for her to have, which we treated w/ zofran and she was later able to drink fluids. Her labs are reassuring with no evidence of DKA or severe dehydration. I reviewed medication plan per PCP's note from today and family voiced understanding of med changes including increasing levemir to 10u daily from 8u. They will f/u with PCP and understand return precautions.  Final Clinical Impressions(s) / ED Diagnoses   Final diagnoses:  Hyperglycemia    ED Discharge Orders    None       Roise Emert, Wenda Overland, MD 12/17/18 1347

## 2018-12-16 NOTE — Assessment & Plan Note (Signed)
Patient with declining status with regards to her metastatic cancer.  She has been vomiting and is dehydrated secondary to her being unable to tolerate her nystatin which gags her.  She was in the ER last week her labs were fairly unremarkable urine culture was negative.  She has not had any fever her blood pressure looks good.  I think we can hydrate her we did help get her blood sugars down. Attempted o get IV here in the office but was unable to do so.  We will send her to her local ER just to get IV fluids.  With regards to her medications made some changes to help keep her nausea at bay.   Thrush :  I will discontinue the nystatin swish and swallow and start her on Diflucan 100 mg tablet once a day for 14 days this is to be crushed.    Gerd: Her pantoprazole will be discontinued and says she will take liquid Zantac twice a day.    For nausea:  in general she will start the Reglan disintegrative tablet she can also use Zofran as needed.  Phenergan suppository for severe nausea vomiting.    With regards to her history of DVT:  will try to continue her on the Eliquis as this cannot be crushed I did discuss this with the caregiver.  Diabetes: Crease Levemir to 10 units.  We will try to increase her free water in general as well.  We will follow-up with him in 48 hours via phone and see how her levels look.

## 2018-12-16 NOTE — Telephone Encounter (Signed)
Bring her in at 12pm to be seen

## 2018-12-16 NOTE — ED Notes (Signed)
PT had small emesis

## 2018-12-16 NOTE — Progress Notes (Signed)
Subjective:    Patient ID: Theresa Howe, female    DOB: Jun 10, 1931, 84 y.o.   MRN: [id number]  Patient presents for Hyperglycemia (Patient in for vomiting when taking medications, has been going on for 1 week and blood sugars have been ranging 300-530's)  84y.o. female with protein calorie malnutrition, metastatic neuroendocrine tumor of the small bowel  history of DVT on eliquis/ continues tube feeds    Pt here to f/u ER visit and for continued elevated blood sugars. Info obtained from her caregiver who is wife of her nephew. Her glucose has been upwards 300-500's for the past week.  Blood sugar this AM  530, came down to 400's after water  She was sent to ER on Christmas eve, concern for infection/UTI, as she has had vomiting for past week. Vomiting is associated with her taking her nystatin swish and swallow four times a day, it seems to gag her and then she starts vomiting. Her caregiver started to crush her medications and she has been able to take with water. No fever, no diarrhea. She is still being treating for her Neuroendocrine tumor with monthly chemo injections She has feeding tube with vital tube feeds daily and protein supplements. She has not been on the reglan, it went to a different pharmacy, she tried the zofran but still had vomiting. She has had some coughing spells after vomiting, but non in between She also complains of some abdominal discomfort on and off but this has unchanged. Emesis is NB/NB Reviewed her labs and urine culture from last week which were unremarkable  DM- currently on Levemir 8 units at bedtime     Review Of Systems: per above   GEN-+ fatigue, fever, weight loss,weakness, recent illness HEENT- denies eye drainage, change in vision, nasal discharge, CVS- denies chest pain, palpitations RESP- denies SOB, cough, wheeze ABD- + N/V, change in stools, abd pain GU- denies dysuria, hematuria, dribbling, incontinence MSK- denies joint pain, muscle  aches, injury Neuro- denies headache, dizziness, syncope, seizure activity       Objective:    BP 118/68   Pulse 90   Temp 97.6 F (36.4 C) (Oral)   Resp 16   Ht 5\' 4"  (1.626 m)   Wt 84 lb 2 oz (38.2 kg)   SpO2 97%   BMI 14.44 kg/m  GEN- NAD, alert and oriented x3,chronically ill appearing  HEENT- PERRL, EOMI, non injected sclera, pink conjunctiva, MMM, oropharynx thick thrush tongue Neck- Supple, no LAD CVS- RRR, no murmur RESP-CTAB ABD-NABS,soft,NT,ND, G tube in place with mild brown discharge, no odor, no cellulitis EXT- No edema Pulses- Radial 2+        Assessment & Plan:      Problem List Items Addressed This Visit      Unprioritized   Diabetes with neurologic complications ([facility])   Metastasis to bone ([facility])   Relevant Medications   ondansetron (ZOFRAN ODT) 4 MG disintegrating tablet   fluconazole (DIFLUCAN) 100 MG tablet   Oral thrush   Relevant Medications   fluconazole (DIFLUCAN) 100 MG tablet   Primary malignant neuroendocrine tumor of small intestine ([facility])    Patient with declining status with regards to her metastatic cancer.  She has been vomiting and is dehydrated secondary to her being unable to tolerate her nystatin which gags her.  She was in the ER last week her labs were fairly unremarkable urine culture was negative.  She has not had any fever her blood pressure looks good.  I  think we can hydrate her we did help get her blood sugars down. Attempted o get IV here in the office but was unable to do so.  We will send her to her local ER just to get IV fluids.  With regards to her medications made some changes to help keep her nausea at bay.   Thrush :  I will discontinue the nystatin swish and swallow and start her on Diflucan 100 mg tablet once a day for 14 days this is to be crushed.    Gerd: Her pantoprazole will be discontinued and says she will take liquid Zantac twice a day.    For nausea:  in general she will start the Reglan disintegrative  tablet she can also use Zofran as needed.  Phenergan suppository for severe nausea vomiting.    With regards to her history of DVT:  will try to continue her on the Eliquis as this cannot be crushed I did discuss this with the caregiver.  Diabetes: Crease Levemir to 10 units.  We will try to increase her free water in general as well.  We will follow-up with him in 48 hours via phone and see how her levels look.      Relevant Medications   ondansetron (ZOFRAN ODT) 4 MG disintegrating tablet   fluconazole (DIFLUCAN) 100 MG tablet    Other Visit Diagnoses    Non-intractable vomiting with nausea, unspecified vomiting type    -  Primary   Relevant Orders   Comprehensive metabolic panel   CBC with Differential/Platelet   Mild dehydration          Note: This dictation was prepared with Dragon dictation along with smaller phrase technology. Any transcriptional errors that result from this process are unintentional.

## 2018-12-16 NOTE — Telephone Encounter (Signed)
Patient scheduled for 12

## 2018-12-16 NOTE — Discharge Instructions (Signed)
CONTINUE ALL MEDICATIONS INCLUDING THE NEW MEDICATION CHANGES AS INSTRUCTED BY YOUR PRIMARY DOCTOR TODAY. YOU ARE TO INCREASE LEVEMIR TO 10 UNITS DAILY. PLEASE TAKE AS SOON AS YOU GET HOME. RETURN TO ER IF ANY SEVERE VOMITING, BLOOD SUGAR OVER 500, OR OTHER SUDDEN CHANGES IN SYMPTOMS.

## 2018-12-16 NOTE — Patient Instructions (Addendum)
Start zantac 70ml twice a day  Stop the protonix  Try Reglan dissolvable tablet first, if this does not work then zofran dissolvable  Phenergan suppository for severe nausea with vomiting Diflucan crush tablet once a day for 2 weeks  Levemir 8 units Increase fluids by 1-2 cups  F/U by phone this Thursday with blood sugar readings

## 2018-12-16 NOTE — Telephone Encounter (Signed)
Patient's care taker called back in stating that patient's blood sugars are still elevated after increasing her Levemir 8 units. She stated that her sugars have been 330 and above. She is also unable to take any oral medications from vomiting. States that she gags every time until she vomits. Please advise?

## 2018-12-16 NOTE — ED Notes (Signed)
Pt given ginger ale.

## 2018-12-17 ENCOUNTER — Ambulatory Visit: Payer: Self-pay | Admitting: Family Medicine

## 2018-12-17 IMAGING — XA IR FISTULA/SINUS TRACT
4 series · 4 of 4 positions shown · non-contrast
Comparison: CT abdomen and pelvis - [DATE];

CLINICAL DATA: History of intestinal surgery complicated by
postoperative fluid collection post ultrasound-guided drainage
catheter placement on 01/09/2018
TECHNIQUE: The patient was positioned supine on the fluoroscopy table.

[Series 1: fl (-) angio · 1 of 1 slices shown (1 of 4)]
[im 1/1]
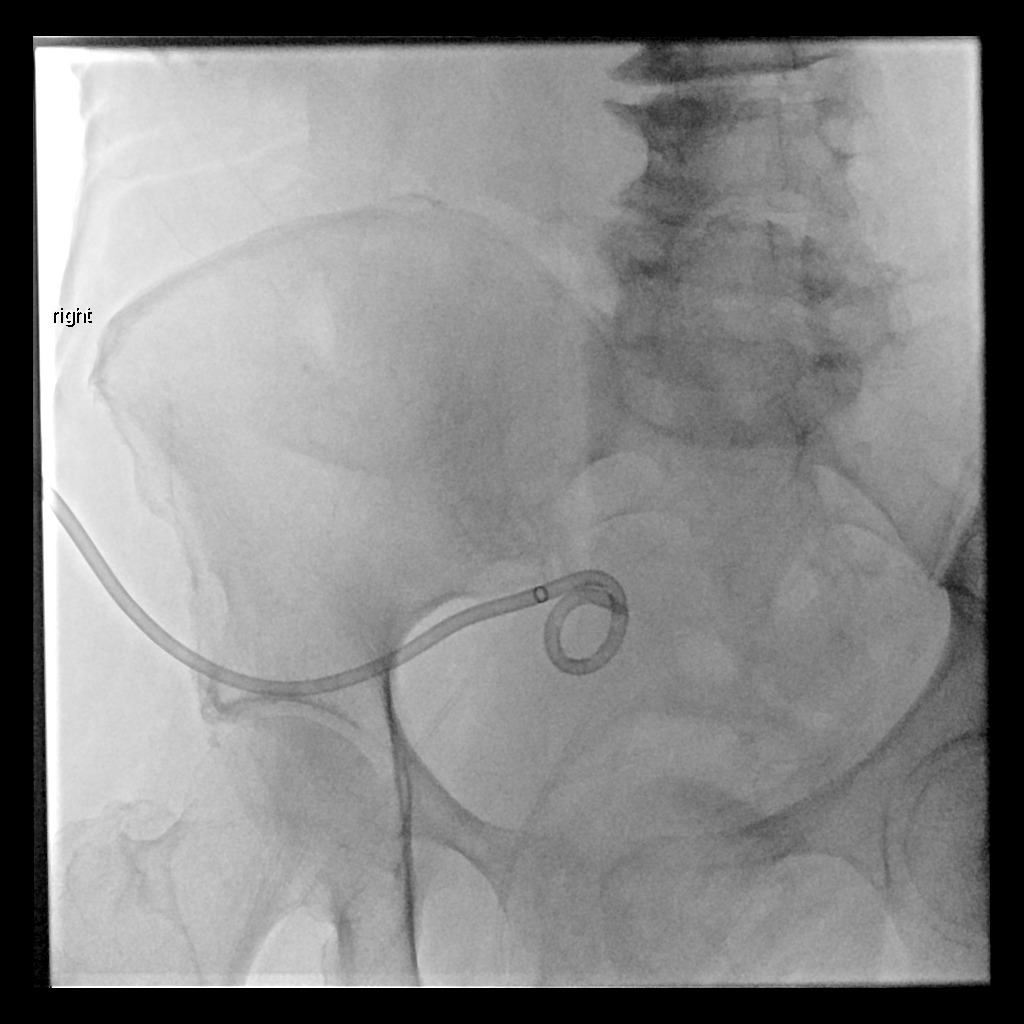

[Series 2: fl (-) angio · 1 of 1 slices shown (2 of 4)]
[im 1/1]
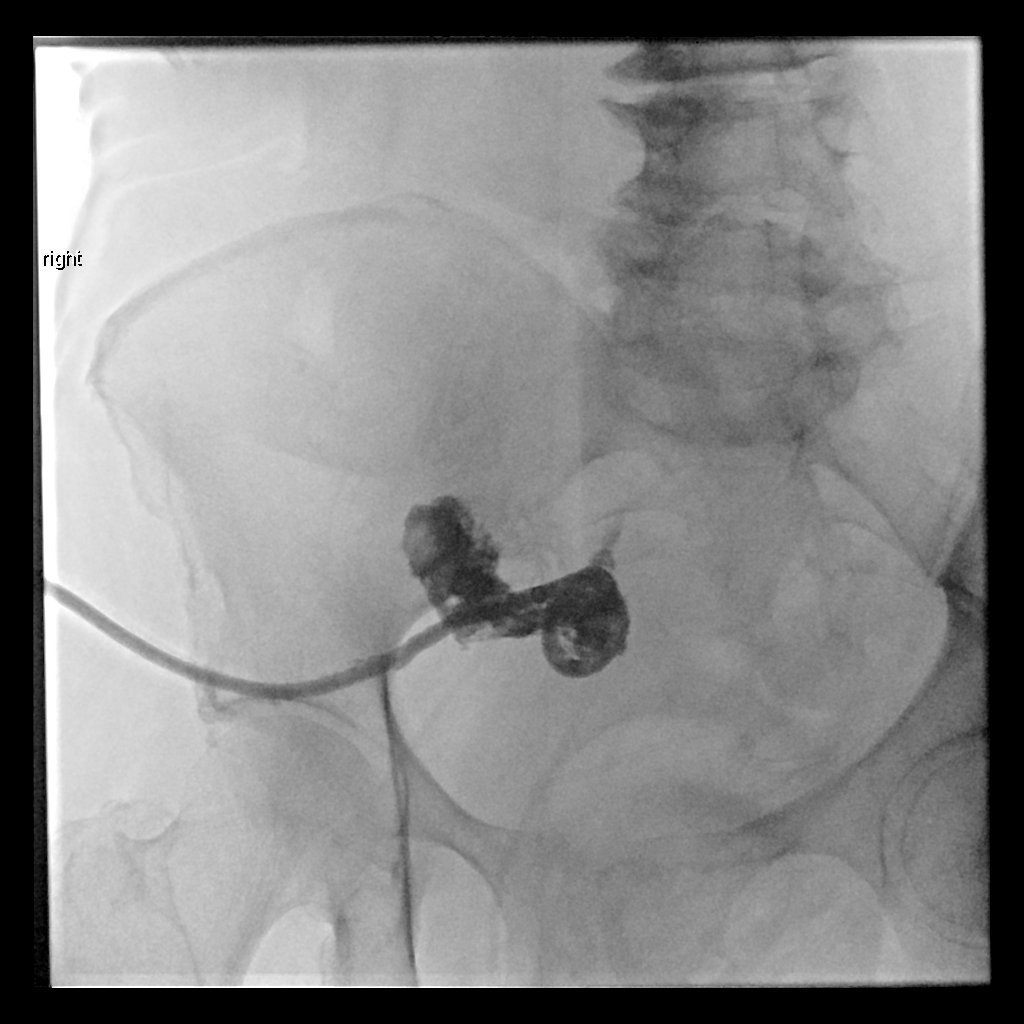

[Series 3: fl (-) angio · 1 of 1 slices shown (3 of 4)]
[im 1/1]
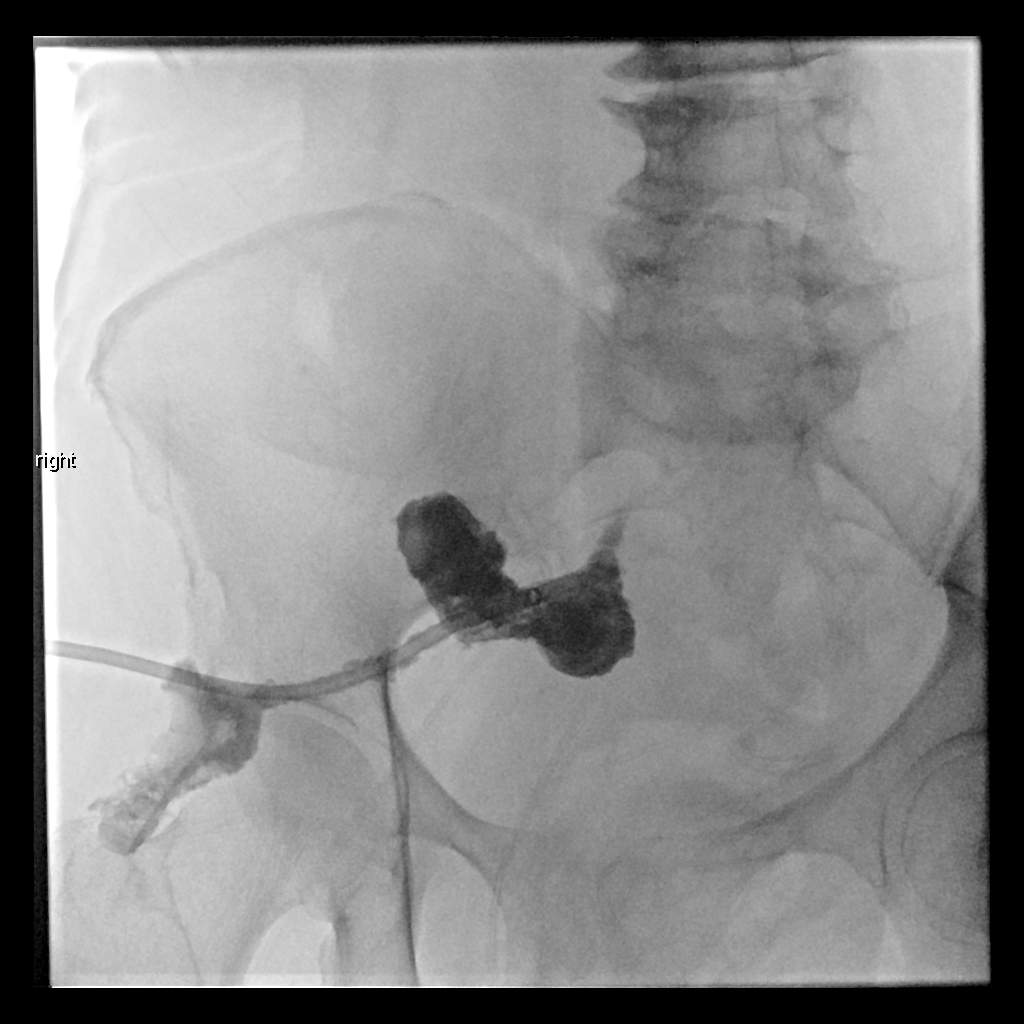

[Series 4: fl (-) angio · 1 of 1 slices shown (4 of 4)]
[im 1/1]
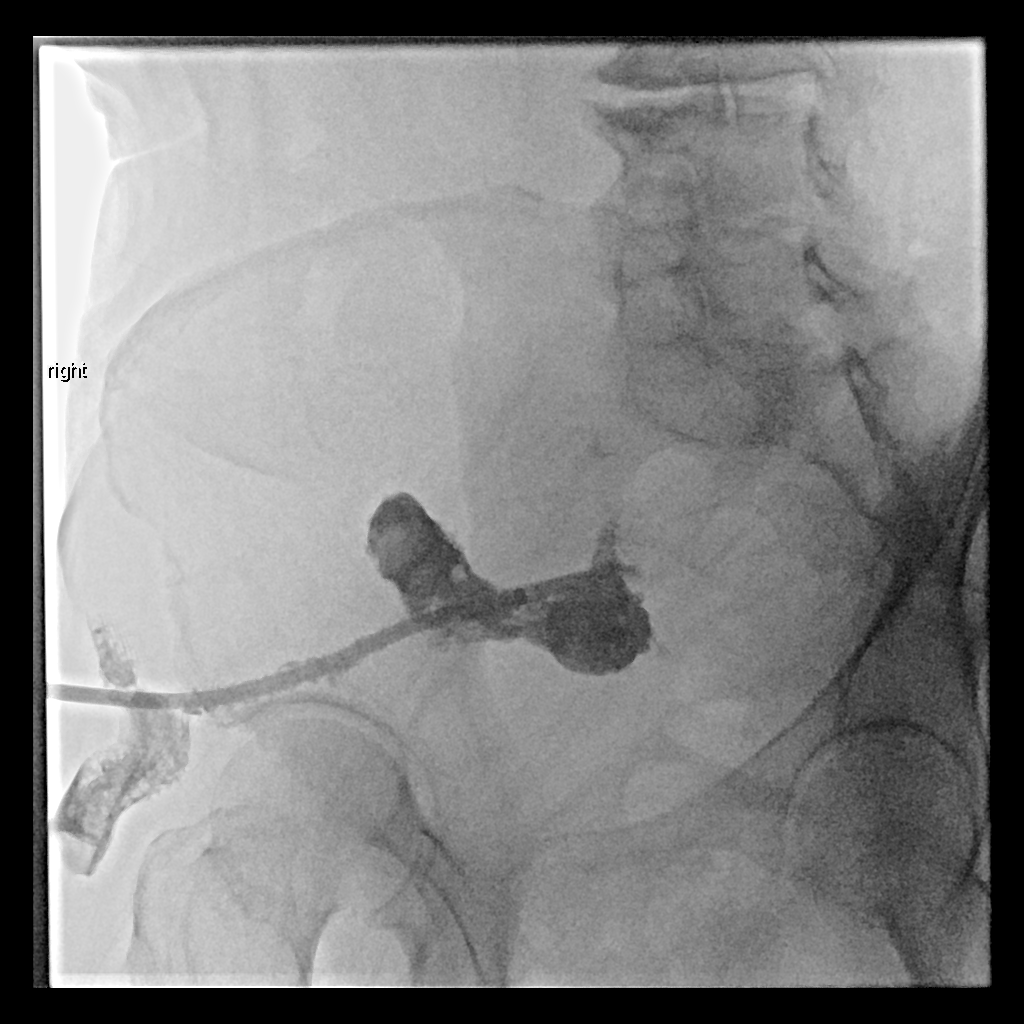

[4 of 4 positions shown; findings below may reference images not displayed]

Postprocedural CT scan of the abdomen pelvis demonstrates
significant reduction in the size of the residual lower
abdominal/pelvic fluid collection and as such, request made for
drainage catheter injection prior to potential removal.

The drainage catheter continues to put [REDACTED] cc of
blood tinged fluid per day.

EXAM:
SINUS TRACT INJECTION/FISTULOGRAM
01/16/2018;
ultrasound-guided drainage catheter placement-01/07/2018

CONTRAST:  10 cc [id number]

FLUOROSCOPY TIME:  42 seconds
A preprocedural spot fluoroscopic image was obtained of the right
lower abdomen/pelvis and existing percutaneous drainage catheter

Multiple spot fluoroscopic and radiographic images were obtained
following the injection of a small amount of contrast via the
existing percutaneous drainage catheter.

Multiple spot fluoroscopic and radiographic images were obtained
from the injection of a small amount of contrast via the existing
percutaneous drainage catheter

Images reviewed and discussed with referring surgeon, Dr. Ceola and
the decision was made to maintain the drainage catheter in place at
this time.

As such, the drainage catheter was flushed with a small amount of
saline and reconnected to a JP bulb.
FINDINGS: Preprocedural spot fluoroscopic image demonstrates unchanged
positioning of the percutaneous drainage catheter with end coiled
and locked over the right hemipelvis.

Contrast injection demonstrates opacification of a moderate-sized
residual decompressed abscess cavity without definitive fistulous
communication to adjacent intestines. There is minimal reflux of
contrast along the catheter tract to the entrance site at the skin
surface.
IMPRESSION: 1. Appropriately positioned and functioning percutaneous drainage
catheter.
2. Opacification of a moderate-sized residual decompressed abscess
cavity without definitive fistulous connection.

PLAN:
- Per my discussion with Dr. Ceola, given persistent output from
the moderate-sized decompressed abscess cavity, the decision was
made to maintain the drainage catheter in place at this time.

- Orders were placed to no longer flush the percutaneous drainage
catheter but to maintain diligent records regarding daily drainage
catheter output.

- If and when the output has reduced to less than 10 cc per day the
drainage catheter could be removed at that time either with or
without repeat fluoroscopic guided injection.

## 2018-12-21 ENCOUNTER — Other Ambulatory Visit: Payer: Self-pay

## 2018-12-21 ENCOUNTER — Encounter (HOSPITAL_BASED_OUTPATIENT_CLINIC_OR_DEPARTMENT_OTHER): Payer: Self-pay | Admitting: Emergency Medicine

## 2018-12-21 ENCOUNTER — Emergency Department (HOSPITAL_BASED_OUTPATIENT_CLINIC_OR_DEPARTMENT_OTHER): Payer: Medicare Other

## 2018-12-21 ENCOUNTER — Inpatient Hospital Stay (HOSPITAL_BASED_OUTPATIENT_CLINIC_OR_DEPARTMENT_OTHER)
Admission: EM | Admit: 2018-12-21 | Discharge: 2018-12-26 | DRG: 843 | Disposition: A | Discharge status: Hospice | Payer: Medicare Other | Attending: Internal Medicine | Admitting: Internal Medicine

## 2018-12-21 DIAGNOSIS — Z86718 Personal history of other venous thrombosis and embolism: Secondary | ICD-10-CM | POA: Diagnosis not present

## 2018-12-21 DIAGNOSIS — E860 Dehydration: Secondary | ICD-10-CM | POA: Diagnosis present

## 2018-12-21 DIAGNOSIS — C7A019 Malignant carcinoid tumor of the small intestine, unspecified portion: Secondary | ICD-10-CM | POA: Diagnosis not present

## 2018-12-21 DIAGNOSIS — R64 Cachexia: Secondary | ICD-10-CM | POA: Diagnosis present

## 2018-12-21 DIAGNOSIS — Z515 Encounter for palliative care: Secondary | ICD-10-CM | POA: Diagnosis not present

## 2018-12-21 DIAGNOSIS — I700 Atherosclerosis of aorta: Secondary | ICD-10-CM | POA: Diagnosis present

## 2018-12-21 DIAGNOSIS — C7B03 Secondary carcinoid tumors of bone: Secondary | ICD-10-CM | POA: Diagnosis not present

## 2018-12-21 DIAGNOSIS — F0390 Unspecified dementia without behavioral disturbance: Secondary | ICD-10-CM | POA: Diagnosis present

## 2018-12-21 DIAGNOSIS — Z9841 Cataract extraction status, right eye: Secondary | ICD-10-CM

## 2018-12-21 DIAGNOSIS — Z794 Long term (current) use of insulin: Secondary | ICD-10-CM

## 2018-12-21 DIAGNOSIS — K6389 Other specified diseases of intestine: Secondary | ICD-10-CM | POA: Diagnosis not present

## 2018-12-21 DIAGNOSIS — N289 Disorder of kidney and ureter, unspecified: Secondary | ICD-10-CM

## 2018-12-21 DIAGNOSIS — K5669 Other partial intestinal obstruction: Secondary | ICD-10-CM | POA: Diagnosis not present

## 2018-12-21 DIAGNOSIS — Z931 Gastrostomy status: Secondary | ICD-10-CM

## 2018-12-21 DIAGNOSIS — I10 Essential (primary) hypertension: Secondary | ICD-10-CM | POA: Diagnosis present

## 2018-12-21 DIAGNOSIS — E785 Hyperlipidemia, unspecified: Secondary | ICD-10-CM | POA: Diagnosis present

## 2018-12-21 DIAGNOSIS — C779 Secondary and unspecified malignant neoplasm of lymph node, unspecified: Secondary | ICD-10-CM | POA: Diagnosis present

## 2018-12-21 DIAGNOSIS — C7A8 Other malignant neuroendocrine tumors: Principal | ICD-10-CM | POA: Diagnosis present

## 2018-12-21 DIAGNOSIS — Z66 Do not resuscitate: Secondary | ICD-10-CM | POA: Diagnosis not present

## 2018-12-21 DIAGNOSIS — Z431 Encounter for attention to gastrostomy: Secondary | ICD-10-CM | POA: Diagnosis not present

## 2018-12-21 DIAGNOSIS — E873 Alkalosis: Secondary | ICD-10-CM | POA: Diagnosis present

## 2018-12-21 DIAGNOSIS — E1140 Type 2 diabetes mellitus with diabetic neuropathy, unspecified: Secondary | ICD-10-CM | POA: Diagnosis present

## 2018-12-21 DIAGNOSIS — E870 Hyperosmolality and hypernatremia: Secondary | ICD-10-CM | POA: Diagnosis not present

## 2018-12-21 DIAGNOSIS — C801 Malignant (primary) neoplasm, unspecified: Secondary | ICD-10-CM | POA: Diagnosis not present

## 2018-12-21 DIAGNOSIS — Z90710 Acquired absence of both cervix and uterus: Secondary | ICD-10-CM

## 2018-12-21 DIAGNOSIS — C7951 Secondary malignant neoplasm of bone: Secondary | ICD-10-CM | POA: Diagnosis present

## 2018-12-21 DIAGNOSIS — N179 Acute kidney failure, unspecified: Secondary | ICD-10-CM | POA: Diagnosis present

## 2018-12-21 DIAGNOSIS — E119 Type 2 diabetes mellitus without complications: Secondary | ICD-10-CM

## 2018-12-21 DIAGNOSIS — N1330 Unspecified hydronephrosis: Secondary | ICD-10-CM | POA: Diagnosis present

## 2018-12-21 DIAGNOSIS — I959 Hypotension, unspecified: Secondary | ICD-10-CM | POA: Diagnosis not present

## 2018-12-21 DIAGNOSIS — E43 Unspecified severe protein-calorie malnutrition: Secondary | ICD-10-CM | POA: Diagnosis present

## 2018-12-21 DIAGNOSIS — K56609 Unspecified intestinal obstruction, unspecified as to partial versus complete obstruction: Secondary | ICD-10-CM

## 2018-12-21 DIAGNOSIS — B370 Candidal stomatitis: Secondary | ICD-10-CM | POA: Diagnosis present

## 2018-12-21 DIAGNOSIS — E1143 Type 2 diabetes mellitus with diabetic autonomic (poly)neuropathy: Secondary | ICD-10-CM | POA: Diagnosis not present

## 2018-12-21 DIAGNOSIS — R29898 Other symptoms and signs involving the musculoskeletal system: Secondary | ICD-10-CM | POA: Diagnosis not present

## 2018-12-21 DIAGNOSIS — Z681 Body mass index (BMI) 19 or less, adult: Secondary | ICD-10-CM | POA: Diagnosis not present

## 2018-12-21 DIAGNOSIS — F0150 Vascular dementia without behavioral disturbance: Secondary | ICD-10-CM | POA: Diagnosis not present

## 2018-12-21 DIAGNOSIS — E1165 Type 2 diabetes mellitus with hyperglycemia: Secondary | ICD-10-CM | POA: Diagnosis present

## 2018-12-21 DIAGNOSIS — M810 Age-related osteoporosis without current pathological fracture: Secondary | ICD-10-CM | POA: Diagnosis present

## 2018-12-21 DIAGNOSIS — C787 Secondary malignant neoplasm of liver and intrahepatic bile duct: Secondary | ICD-10-CM | POA: Diagnosis present

## 2018-12-21 DIAGNOSIS — Z7189 Other specified counseling: Secondary | ICD-10-CM | POA: Diagnosis not present

## 2018-12-21 DIAGNOSIS — R739 Hyperglycemia, unspecified: Secondary | ICD-10-CM

## 2018-12-21 DIAGNOSIS — IMO0001 Reserved for inherently not codable concepts without codable children: Secondary | ICD-10-CM

## 2018-12-21 DIAGNOSIS — C7B02 Secondary carcinoid tumors of liver: Secondary | ICD-10-CM | POA: Diagnosis not present

## 2018-12-21 DIAGNOSIS — Z7401 Bed confinement status: Secondary | ICD-10-CM | POA: Diagnosis not present

## 2018-12-21 DIAGNOSIS — H409 Unspecified glaucoma: Secondary | ICD-10-CM | POA: Diagnosis present

## 2018-12-21 DIAGNOSIS — M2550 Pain in unspecified joint: Secondary | ICD-10-CM | POA: Diagnosis not present

## 2018-12-21 DIAGNOSIS — R5381 Other malaise: Secondary | ICD-10-CM | POA: Diagnosis not present

## 2018-12-21 DIAGNOSIS — Z7901 Long term (current) use of anticoagulants: Secondary | ICD-10-CM

## 2018-12-21 LAB — I-STAT VENOUS BLOOD GAS, ED
Acid-Base Excess: 3 mmol/L — ABNORMAL HIGH (ref 0.0–2.0)
Bicarbonate: 26 mmol/L (ref 20.0–28.0)
O2 Saturation: 97 %
PO2 VEN: 84 mmHg — AB (ref 32.0–45.0)
Patient temperature: 97.8
TCO2: 27 mmol/L (ref 22–32)
pCO2, Ven: 32.9 mmHg — ABNORMAL LOW (ref 44.0–60.0)
pH, Ven: 7.503 — ABNORMAL HIGH (ref 7.250–7.430)

## 2018-12-21 LAB — BASIC METABOLIC PANEL
Anion gap: 13 (ref 5–15)
BUN: 67 mg/dL — ABNORMAL HIGH (ref 8–23)
CO2: 17 mmol/L — ABNORMAL LOW (ref 22–32)
Calcium: 9.8 mg/dL (ref 8.9–10.3)
Chloride: 107 mmol/L (ref 98–111)
Creatinine, Ser: 1.38 mg/dL — ABNORMAL HIGH (ref 0.44–1.00)
GFR calc Af Amer: 40 mL/min — ABNORMAL LOW (ref >60)
GFR calc non Af Amer: 35 mL/min — ABNORMAL LOW (ref >60)
Glucose, Bld: 658 mg/dL (ref 70–99)
Potassium: 5.4 mmol/L — ABNORMAL HIGH (ref 3.5–5.1)
Sodium: 137 mmol/L (ref 135–145)

## 2018-12-21 LAB — CBC
HEMATOCRIT: 48.5 % — AB (ref 36.0–46.0)
Hemoglobin: 15.3 g/dL — ABNORMAL HIGH (ref 12.0–15.0)
MCH: 28.7 pg (ref 26.0–34.0)
MCHC: 31.5 g/dL (ref 30.0–36.0)
MCV: 90.8 fL (ref 80.0–100.0)
Platelets: 358 10*3/uL (ref 150–400)
RBC: 5.34 MIL/uL — ABNORMAL HIGH (ref 3.87–5.11)
RDW: 14.2 % (ref 11.5–15.5)
WBC: 6.2 10*3/uL (ref 4.0–10.5)
nRBC: 0 % (ref 0.0–0.2)

## 2018-12-21 LAB — CBG MONITORING, ED
Glucose-Capillary: 375 mg/dL — ABNORMAL HIGH (ref 70–99)
Glucose-Capillary: 463 mg/dL — ABNORMAL HIGH (ref 70–99)
Glucose-Capillary: 523 mg/dL (ref 70–99)

## 2018-12-21 LAB — I-STAT CG4 LACTIC ACID, ED: Lactic Acid, Venous: 3.02 mmol/L (ref 0.5–1.9)

## 2018-12-21 MED ORDER — SODIUM CHLORIDE 0.9 % IV BOLUS (SEPSIS)
500.0000 mL | Freq: Once | INTRAVENOUS | Status: AC
  Administered 2018-12-21: 500 mL via INTRAVENOUS

## 2018-12-21 MED ORDER — SODIUM CHLORIDE 0.9 % IV SOLN
1000.0000 mL | INTRAVENOUS | Status: DC
  Administered 2018-12-21: 1000 mL via INTRAVENOUS

## 2018-12-21 MED ORDER — SODIUM CHLORIDE 0.9 % IV SOLN
INTRAVENOUS | Status: DC
  Administered 2018-12-21: 23:00:00 via INTRAVENOUS

## 2018-12-21 MED ORDER — INSULIN REGULAR(HUMAN) IN NACL 100-0.9 UT/100ML-% IV SOLN
INTRAVENOUS | Status: DC
  Administered 2018-12-21: 4 [IU]/h via INTRAVENOUS
  Filled 2018-12-21: qty 100

## 2018-12-21 MED ORDER — DEXTROSE 50 % IV SOLN: 25.0000 mL | INTRAVENOUS | Status: DC | PRN

## 2018-12-21 MED ORDER — INSULIN REGULAR BOLUS VIA INFUSION
0.0000 [IU] | Freq: Three times a day (TID) | INTRAVENOUS | Status: DC
  Filled 2018-12-21: qty 10

## 2018-12-21 MED ORDER — IOPAMIDOL (ISOVUE-300) INJECTION 61%: 100.0000 mL | Freq: Once | INTRAVENOUS | Status: DC

## 2018-12-21 MED ORDER — SODIUM CHLORIDE 0.9 % IV SOLN
INTRAVENOUS | Status: AC
  Filled 2018-12-21: qty 1

## 2018-12-21 NOTE — ED Provider Notes (Signed)
[facility] EMERGENCY DEPARTMENT Provider Note   CSN: [id number] Arrival date & time: 12/21/18  7829     History   Chief Complaint Chief Complaint  Patient presents with  . Hyperglycemia    HPI Theresa Howe is a 85 y.o. female.  HPI Patient has had ongoing vomiting since treatment in the emergency department.  Her daughter has been trying Phenergan and Zofran.  However she is only tolerating small amounts of her tube feeds but continues to have vomiting.  Daughter also has been following instructions for insulin administration but blood sugars are persistently getting more elevated.  Patient denies he has any active pain. Past Medical History:  Diagnosis Date  . Abdominal mass 10/2017   SMALL BOWEL  . Diabetes mellitus    type 2  . Dyspnea   . Failure to thrive (0-17) 12/24/2017  . Glaucoma   . Hyperlipidemia   . Hypertension   . Neuropathy   . Osteoporosis   . Wheezing     Patient Active Problem List   Diagnosis Date Noted  . Metastasis to liver ([place]) 11/08/2018  . Metastasis to bone ([place]) 11/08/2018  . Dementia ([place]) 10/18/2018  . Hypomagnesemia 10/07/2018  . Hypokalemia 10/07/2018  . History of DVT (deep vein thrombosis) 09/26/2018  . Partial small bowel obstruction ([place]) 09/25/2018  . Advance care planning   . On total parenteral nutrition (TPN)   . Dysphagia   . Intra-abdominal abscess ([place])   . Oral thrush   . Palliative care by specialist   . Goals of care, counseling/discussion   . 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 Adult failure to thrive 12/24/2017  . Protein-calorie malnutrition, severe 12/06/2017  . Small bowel mass 12/04/2017  . Protein-calorie malnutrition ([place]) 09/11/2017  . Weight loss 09/11/2017  . Diabetic neuropathy ([place]) 09/28/2015  . Hip pain 09/21/2014  . Muscle ache of extremity 06/12/2014  . Anxiety state 10/06/2013  . Bladder prolapse, female, acquired 08/03/2013  .  Osteoporosis, unspecified 08/03/2013  . Nonspecific (abnormal) findings on radiological and other examination of biliary tract 04/17/2013  . Diabetes with neurologic complications ([place]) 56/21/3086  . HTN (hypertension) 09/18/2011  . Hyperlipidemia LDL goal <70 09/18/2011  . Glaucoma 09/18/2011    Past Surgical History:  Procedure Laterality Date  . ABDOMINAL HYSTERECTOMY    . CATARACT EXTRACTION Right 01/2010  . COLON SURGERY    . COLONOSCOPY    . EUS N/A 04/17/2013   Procedure: UPPER ENDOSCOPIC ULTRASOUND (EUS) LINEAR;  Surgeon: Milus Banister, MD;  Location: [facility] ENDOSCOPY;  Service: Endoscopy;  Laterality: N/A;  . GLAUCOMA SURGERY  2007  . IR CM INJ ANY COLONIC TUBE W/FLUORO  09/12/2018  . IR FLUORO GUIDE CV LINE RIGHT  01/17/2018  . IR GASTROSTOMY TUBE MOD SED  04/11/2018  . IR IMAGE GUIDED DRAINAGE PERCUT CATH  PERITONEAL RETROPERIT  01/09/2018  . IR REMOVAL TUN CV CATH W/O FL  06/19/2018  . IR SINUS/FIST TUBE CHK-NON GI  01/18/2018  . IR US GUIDE VASC ACCESS RIGHT  01/17/2018  . LAPAROSCOPIC SMALL BOWEL RESECTION  12/04/2017  . LAPAROSCOPIC SMALL BOWEL RESECTION N/A 12/04/2017   Procedure: LAPAROSCOPIC SMALL BOWEL RESECTION;  Surgeon: Stark Klein, MD;  Location: [facility];  Service: General;  Laterality: N/A;  . LAPAROSCOPY N/A 12/04/2017   Procedure: LAPAROSCOPY DIAGNOSTIC;  Surgeon: Stark Klein, MD;  Location: [facility];  Service: General;  Laterality: N/A;  . LAPAROSCOPY N/A 12/27/2017   Procedure: DIAGNOSTIC LAPAROSCOPY/  LAPAROTOMY WITH SMALL BOWEL RESECTION TIMES TWO;  Surgeon: Stark Klein, MD;  Location: [facility];  Service: General;  Laterality: N/A;  . MULTIPLE TOOTH EXTRACTIONS    . PICC LINE INSERTION  12/24/2017     OB History   No obstetric history on file.      Home Medications    Prior to Admission medications   Medication Sig Start Date End Date Taking? Authorizing Provider  acetaminophen (TYLENOL) 325 MG tablet Take 650 mg by mouth every 6 (six) hours as needed for  mild pain, moderate pain or headache.     [provider]  Blood Glucose Monitoring Suppl (BLOOD GLUCOSE SYSTEM PAK) KIT Please dispense based on patient and insurance preference. Use as directed to monitor FSBS 1x daily. Dx: E11.9. 09/03/18   Delsa Grana, PA-C  ELIQUIS 2.5 MG TABS tablet TAKE 1 TABLET BY MOUTH TWICE A DAY 12/16/18   Susy Frizzle, MD  fluconazole (DIFLUCAN) 100 MG tablet Take 1 tablet (100 mg total) by mouth daily. FOR 14 DAYS, CRUSH 12/16/18   Alycia Rossetti, MD  Glucose Blood (BLOOD GLUCOSE TEST STRIPS) STRP Please dispense based on patient and insurance preference. Use as directed to monitor FSBS 1x daily. Dx: E11.9. 09/03/18   Delsa Grana, PA-C  insulin detemir (LEVEMIR) 100 UNIT/ML injection Inject 0.08 mLs (8 Units total) into the skin at bedtime. 12/12/18   Alycia Rossetti, MD  Insulin Pen Needle (BD PEN NEEDLE MICRO U/F) 32G X 6 MM MISC Use with insulin pen ICD10:e11.43 09/16/18   Alycia Rossetti, MD  Lancets MISC Please dispense based on patient and insurance preference. Use as directed to monitor FSBS 1x daily. Dx: E11.9. 09/03/18   Delsa Grana, PA-C  Metoclopramide HCl 5 MG TBDP Take 1 tablet (5 mg total) by mouth 3 (three) times daily. 12/16/18   [place], Modena Nunnery, MD  nystatin (MYCOSTATIN) 100000 UNIT/ML suspension Take 5 mLs (500,000 Units total) by mouth 4 (four) times daily. As needed. Swish and spit. 12/04/18   [place], Modena Nunnery, MD  ondansetron (ZOFRAN ODT) 4 MG disintegrating tablet Take 1 tablet (4 mg total) by mouth every 8 (eight) hours as needed for nausea or vomiting. 12/16/18   [place], Modena Nunnery, MD  ondansetron (ZOFRAN) 4 MG tablet Take 4 mg by mouth every 8 (eight) hours as needed for nausea or vomiting.    [provider]  pantoprazole (PROTONIX) 40 MG tablet TAKE 1 TABLET BY MOUTH EVERY DAY 12/16/18   Susy Frizzle, MD  promethazine (PHENERGAN) 12.5 MG suppository Place 1 suppository (12.5 mg total) rectally every 6 (six)  hours as needed for nausea or vomiting. 12/16/18   Alycia Rossetti, MD  ranitidine (ZANTAC) 15 MG/ML syrup Take 10 mLs (150 mg total) by mouth 2 (two) times daily. 12/16/18   Alycia Rossetti, MD    Family History History reviewed. No pertinent family history.  Social History Social History   Tobacco Use  . Smoking status: Never Smoker  . Smokeless tobacco: Never Used  Substance Use Topics  . Alcohol use: No  . Drug use: No     Allergies   Patient has no known allergies.   Review of Systems Review of Systems 10 Systems reviewed and are negative for acute change except as noted in the HPI.   Physical Exam Updated Vital Signs BP (!) 140/123   Pulse (!) 107   Temp 97.8 F (36.6 C) (Oral)   Resp 19   Ht 5'  4" (1.626 m)   Wt 38.1 kg   SpO2 95%   BMI 14.42 kg/m   Physical Exam Constitutional:      Comments: Patient is extremely cachectic.  She is alert.  No respiratory distress.  HENT:     Head:     Comments: Thick layer of thrush on the tongue. Eyes:     Extraocular Movements: Extraocular movements intact.  Cardiovascular:     Rate and Rhythm: Normal rate and regular rhythm.  Pulmonary:     Effort: Pulmonary effort is normal.     Breath sounds: Normal breath sounds.  Abdominal:     Comments: Abdomen is flat.  Patient has a feeding tube in place.  No guarding.  No Distention.  Musculoskeletal:     Comments: No peripheral edema.  Extremities very cachectic muscular atrophy  Skin:    General: Skin is warm and dry.  Neurological:     Comments: Patient is alert.  No focal motor deficit.  Psychiatric:        Mood and Affect: Mood normal.      ED Treatments / Results  Labs (all labs ordered are listed, but only abnormal results are displayed) Labs Reviewed  BASIC METABOLIC PANEL - Abnormal; Notable for the following components:      Result Value   Potassium 5.4 (*)    CO2 17 (*)    Glucose, Bld 658 (*)    BUN 67 (*)    Creatinine, Ser 1.38 (*)     GFR calc non Af Amer 35 (*)    GFR calc Af Amer 40 (*)    All other components within normal limits  CBC - Abnormal; Notable for the following components:   RBC 5.34 (*)    Hemoglobin 15.3 (*)    HCT 48.5 (*)    All other components within normal limits  CBG MONITORING, ED - Abnormal; Notable for the following components:   Glucose-Capillary 523 (*)    All other components within normal limits  URINALYSIS, ROUTINE W REFLEX MICROSCOPIC  HEPATIC FUNCTION PANEL  LIPASE, BLOOD  BLOOD GAS, VENOUS  I-STAT CG4 LACTIC ACID, ED    EKG None  Radiology No results found.  Procedures Procedures (including critical care time) CRITICAL CARE Performed by: Charlesetta Shanks   Total critical care time: 30 minutes  Critical care time was exclusive of separately billable procedures and treating other patients.  Critical care was necessary to treat or prevent imminent or life-threatening deterioration.  Critical care was time spent personally by me on the following activities: development of treatment plan with patient and/or surrogate as well as nursing, discussions with consultants, evaluation of patient's response to treatment, examination of patient, obtaining history from patient or surrogate, ordering and performing treatments and interventions, ordering and review of laboratory studies, ordering and review of radiographic studies, pulse oximetry and re-evaluation of patient's condition. Medications Ordered in ED Medications  sodium chloride 0.9 % bolus 500 mL (has no administration in time range)    Followed by  0.9 %  sodium chloride infusion (has no administration in time range)  insulin regular bolus via infusion 0-10 Units (has no administration in time range)  insulin regular, human (MYXREDLIN) 100 units/ 100 mL infusion (has no administration in time range)  dextrose 50 % solution 25 mL (has no administration in time range)  0.9 %  sodium chloride infusion (has no administration in  time range)     Initial Impression / Assessment and Plan / ED  Course  I have reviewed the triage vital signs and the nursing notes.  Pertinent labs & imaging results that were available during my care of the patient were reviewed by me and considered in my medical decision making (see chart for details).  Clinical Course as of Dec 23 51  Sat Dec 21, 2018  2326 Consult: Reviewed with Dr. Ninfa Linden general surgery.  Will see patient in consult.   [MP]  2336 Consult: Dr. Hampton Abbot for admission tried hospitalist   [MP]    Clinical Course User Index [MP] Charlesetta Shanks, MD   She has have progressive decline.  Patient's daughter has been taking care of her at home but now patient is developing increasing episodes of vomiting and not tolerating tube feeds.  Blood sugars are extremely poorly controlled.  He is showing signs of dehydration with renal insufficiency.  CT shows small bowel obstruction.  Patient will need admission for multiple complications of severe comorbid illness.   Final Clinical Impressions(s) / ED Diagnoses   Final diagnoses:  Dehydration  Renal insufficiency  Hyperglycemia    ED Discharge Orders    None       Charlesetta Shanks, MD 12/22/18 [phone]

## 2018-12-21 NOTE — ED Notes (Signed)
Pt's brief changed by EMT.

## 2018-12-21 NOTE — ED Triage Notes (Signed)
Patients family member states that her blood sugar was reading " too high" on her meter at home  - she also has had frequent loose stools and has recently had N/V - patient has been recently very sleepy

## 2018-12-21 NOTE — ED Notes (Signed)
Dr. Dorothyann Gibbs aware of K level of 5.4. Primary nurse also aware.

## 2018-12-21 NOTE — Plan of Care (Signed)
[facility] PLAN OF CARE NOTE TRANSFER - ADMISSION  FROM:  [facility] TO: [facility]   PATIENT MRN: [id number]   BRIEF SUMMARY: Theresa Howe is a 85 y.o. female with metastatic neuroendocrine cancer who presented with refractory nausea and vomiting. Labs also notable for hyperglycemia. CT abd/pelvis wo contrast reporting diffuse dilation of small bowel with transition point at upper pelvis (concerning for obstruction due to tumor/adhesions) as well as large amount stool in rectum, possible rectal obstruction as well. Surgery was notified by [facility]. Patient is not a surgical candidate, recommending conservative measures at this time. Started on insulin drip and fluids in ED. Mild tachycardia but otherwise BP stable. ED also reports patient appears to be cachectic.    PLAN: - admit to stepdown [facility] unit given insulin drip - plan for decompression via feeding tube as well as rectal enema  - appreciate surgery recs - IV fluids  - discuss goals of care with family/palliative care consult    Colbert Ewing, MD Pager [phone] Go to [email or web address] - [email or web address] for contact info [facility] - Office  [phone]

## 2018-12-22 ENCOUNTER — Encounter (HOSPITAL_COMMUNITY): Payer: Self-pay | Admitting: Family Medicine

## 2018-12-22 DIAGNOSIS — B370 Candidal stomatitis: Secondary | ICD-10-CM

## 2018-12-22 DIAGNOSIS — C7951 Secondary malignant neoplasm of bone: Secondary | ICD-10-CM

## 2018-12-22 DIAGNOSIS — E1143 Type 2 diabetes mellitus with diabetic autonomic (poly)neuropathy: Secondary | ICD-10-CM

## 2018-12-22 DIAGNOSIS — E43 Unspecified severe protein-calorie malnutrition: Secondary | ICD-10-CM

## 2018-12-22 DIAGNOSIS — C787 Secondary malignant neoplasm of liver and intrahepatic bile duct: Secondary | ICD-10-CM

## 2018-12-22 DIAGNOSIS — F0390 Unspecified dementia without behavioral disturbance: Secondary | ICD-10-CM

## 2018-12-22 DIAGNOSIS — K56609 Unspecified intestinal obstruction, unspecified as to partial versus complete obstruction: Secondary | ICD-10-CM | POA: Diagnosis present

## 2018-12-22 DIAGNOSIS — Z794 Long term (current) use of insulin: Secondary | ICD-10-CM

## 2018-12-22 DIAGNOSIS — E860 Dehydration: Secondary | ICD-10-CM

## 2018-12-22 LAB — BASIC METABOLIC PANEL
Anion gap: 4 — ABNORMAL LOW (ref 5–15)
BUN: 54 mg/dL — ABNORMAL HIGH (ref 8–23)
CALCIUM: 8.3 mg/dL — AB (ref 8.9–10.3)
CO2: 27 mmol/L (ref 22–32)
Chloride: 112 mmol/L — ABNORMAL HIGH (ref 98–111)
Creatinine, Ser: 0.88 mg/dL (ref 0.44–1.00)
GFR calc non Af Amer: 60 mL/min — ABNORMAL LOW (ref >60)
Glucose, Bld: 190 mg/dL — ABNORMAL HIGH (ref 70–99)
Potassium: 3.8 mmol/L (ref 3.5–5.1)
Sodium: 143 mmol/L (ref 135–145)

## 2018-12-22 LAB — HEPATIC FUNCTION PANEL
ALBUMIN: 2 g/dL — AB (ref 3.5–5.0)
ALT: 9 U/L (ref 0–44)
AST: 11 U/L — ABNORMAL LOW (ref 15–41)
Alkaline Phosphatase: 43 U/L (ref 38–126)
Bilirubin, Direct: 0.1 mg/dL (ref 0.0–0.2)
Indirect Bilirubin: 0.3 mg/dL (ref 0.3–0.9)
Total Bilirubin: 0.4 mg/dL (ref 0.3–1.2)
Total Protein: 4.1 g/dL — ABNORMAL LOW (ref 6.5–8.1)

## 2018-12-22 LAB — GLUCOSE, CAPILLARY
GLUCOSE-CAPILLARY: 93 mg/dL (ref 70–99)
Glucose-Capillary: 139 mg/dL — ABNORMAL HIGH (ref 70–99)
Glucose-Capillary: 113 mg/dL — ABNORMAL HIGH (ref 70–99)
Glucose-Capillary: 162 mg/dL — ABNORMAL HIGH (ref 70–99)

## 2018-12-22 LAB — CBG MONITORING, ED
Glucose-Capillary: 167 mg/dL — ABNORMAL HIGH (ref 70–99)
Glucose-Capillary: 224 mg/dL — ABNORMAL HIGH (ref 70–99)
Glucose-Capillary: 228 mg/dL — ABNORMAL HIGH (ref 70–99)

## 2018-12-22 LAB — I-STAT CG4 LACTIC ACID, ED
Lactic Acid, Venous: 2.11 mmol/L (ref 0.5–1.9)
Lactic Acid, Venous: 3.86 mmol/L (ref 0.5–1.9)

## 2018-12-22 LAB — LIPASE, BLOOD: Lipase: 32 U/L (ref 11–51)

## 2018-12-22 MED ORDER — ONDANSETRON HCL 4 MG/2ML IJ SOLN
4.0000 mg | Freq: Four times a day (QID) | INTRAMUSCULAR | Status: DC | PRN
  Filled 2018-12-22: qty 2

## 2018-12-22 MED ORDER — ACETAMINOPHEN 325 MG PO TABS
650.0000 mg | ORAL_TABLET | Freq: Four times a day (QID) | ORAL | Status: DC | PRN
  Filled 2018-12-22: qty 2

## 2018-12-22 MED ORDER — LACTATED RINGERS IV SOLN
INTRAVENOUS | Status: DC
  Administered 2018-12-22 – 2018-12-23 (×2): via INTRAVENOUS

## 2018-12-22 MED ORDER — ENOXAPARIN SODIUM 40 MG/0.4ML ~~LOC~~ SOLN
1.0000 mg/kg | SUBCUTANEOUS | Status: DC
  Administered 2018-12-22 – 2018-12-26 (×5): 40 mg via SUBCUTANEOUS
  Filled 2018-12-22 (×5): qty 0.4

## 2018-12-22 MED ORDER — MORPHINE SULFATE (PF) 2 MG/ML IV SOLN: 2.0000 mg | INTRAVENOUS | Status: DC | PRN

## 2018-12-22 MED ORDER — FLUCONAZOLE 100MG IVPB
100.0000 mg | INTRAVENOUS | Status: DC
  Administered 2018-12-22 – 2018-12-26 (×5): 100 mg via INTRAVENOUS
  Filled 2018-12-22 (×5): qty 50

## 2018-12-22 MED ORDER — INSULIN DETEMIR 100 UNIT/ML ~~LOC~~ SOLN
10.0000 [IU] | Freq: Every day | SUBCUTANEOUS | Status: DC
  Administered 2018-12-22 – 2018-12-23 (×2): 10 [IU] via SUBCUTANEOUS
  Filled 2018-12-22 (×2): qty 0.1

## 2018-12-22 MED ORDER — ACETAMINOPHEN 650 MG RE SUPP: 650.0000 mg | Freq: Four times a day (QID) | RECTAL | Status: DC | PRN

## 2018-12-22 MED ORDER — SODIUM CHLORIDE 0.9 % IV BOLUS
1000.0000 mL | Freq: Once | INTRAVENOUS | Status: AC
  Administered 2018-12-22: 1000 mL via INTRAVENOUS

## 2018-12-22 MED ORDER — INSULIN ASPART 100 UNIT/ML ~~LOC~~ SOLN
0.0000 [IU] | SUBCUTANEOUS | Status: DC
  Administered 2018-12-22: 1 [IU] via SUBCUTANEOUS
  Administered 2018-12-22: 2 [IU] via SUBCUTANEOUS
  Administered 2018-12-24: 1 [IU] via SUBCUTANEOUS
  Administered 2018-12-25: 2 [IU] via SUBCUTANEOUS
  Administered 2018-12-25: 3 [IU] via SUBCUTANEOUS
  Administered 2018-12-26: 2 [IU] via SUBCUTANEOUS

## 2018-12-22 NOTE — H&P (Addendum)
History and Physical  Patient Name: Theresa Howe     JXB:[id number]    DOB: [birth year]    DOA: 12/21/2018 PCP: [name], MD  Patient coming from: Home --> [facility]  Chief Complaint: Vomiting, hyperglycemia      HPI: Theresa Howe is a 85 y.o. 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  Caveat that patient is demented and all history collected from chart as no family are present.  Per report, the patient is living with daughter right now.  Over the last few weeks, she has had progressive nausea and vomiting with oral medications, especially nystatin swish and swallow.  Seen in ER on 12/30, given fluids, Zofran and discharged.    Since then, persistent vomiting, nausea.  Now vomiting even without medications, just with attempting tube feeds, without relief from Zofran or Phenergan.  In meantime, also having rising blood sugars despite increasing insulin doses.  No fever. No abdominal pain.    ED course: - Afebrile, heart rate 112, respirations and pulse ox normal, blood pressure 120/88 -Na 137, K 5.4, Cr 1.38 (baseline 0.9), WBC 6.2 K, Hgb 15.3 - Lipase normal - Glucose 658, anion gap normal, bicarb low, VBG showed alkalosis - Lactic acid 3.0 -CT of the abdomen without contrast was obtained that showed a high-grade small bowel obstruction, large stool burden, possible right hydronephrosis, no other acute findings -She was started on an insulin drip, discussed with general surgery, and accepted by hospitalist service for admission for hyperglycemia and small bowel obstruction and acute kidney injury -Overnight, her renal function and hypoglycemia resolved with insulin drip and IV fluids    ROS: Review of Systems  Constitutional: Negative for fever.  Respiratory: Negative for shortness of breath.     Cardiovascular: Negative for chest pain.  Gastrointestinal: Negative for abdominal pain, nausea and vomiting.  All other systems reviewed and are negative. Review of systems unreliable given patient dementia.     Past Medical History:  Diagnosis Date  . Abdominal mass 10/2017   SMALL BOWEL  . Diabetes mellitus    type 2  . Dyspnea   . Failure to thrive (0-17) 12/24/2017  . Glaucoma   . Hyperlipidemia   . Hypertension   . Neuropathy   . Osteoporosis   . Wheezing     Past Surgical History:  Procedure Laterality Date  . ABDOMINAL HYSTERECTOMY    . CATARACT EXTRACTION Right 01/2010  . COLON SURGERY    . COLONOSCOPY    . EUS N/A 04/17/2013   Procedure: UPPER ENDOSCOPIC ULTRASOUND (EUS) LINEAR;  Surgeon: Rachael Fee, MD;  Location: [facility];  Service: Endoscopy;  Laterality: N/A;  . GLAUCOMA SURGERY  2007  . IR CM INJ ANY COLONIC TUBE W/FLUORO  09/12/2018  . IR FLUORO GUIDE CV LINE RIGHT  01/17/2018  . IR GASTROSTOMY TUBE MOD SED  04/11/2018  . IR IMAGE GUIDED DRAINAGE PERCUT CATH  PERITONEAL RETROPERIT  01/09/2018  . IR REMOVAL TUN CV CATH W/O FL  06/19/2018  . IR SINUS/FIST TUBE CHK-NON GI  01/18/2018  . IR US GUIDE VASC ACCESS RIGHT  01/17/2018  . LAPAROSCOPIC SMALL BOWEL RESECTION  12/04/2017  . LAPAROSCOPIC SMALL BOWEL RESECTION N/A 12/04/2017   Procedure: LAPAROSCOPIC SMALL BOWEL RESECTION;  Surgeon: Almond Lint, MD;  Location: [facility] OR;  Service: General;  Laterality:  N/A;  . LAPAROSCOPY N/A 12/04/2017   Procedure: LAPAROSCOPY DIAGNOSTIC;  Surgeon: Almond Lint, MD;  Location: [facility] OR;  Service: General;  Laterality: N/A;  . LAPAROSCOPY N/A 12/27/2017   Procedure: DIAGNOSTIC LAPAROSCOPY/ LAPAROTOMY WITH SMALL BOWEL RESECTION TIMES TWO;  Surgeon: Almond Lint, MD;  Location: [facility] OR;  Service: General;  Laterality: N/A;  . MULTIPLE TOOTH EXTRACTIONS    . PICC LINE INSERTION  12/24/2017    Social History: Patient lives with her daughter right now.  The patient unsure if  she is able to walk.  Nonsmoker.  No Known Allergies  Family history: Patient unable to reliably provide due to dementia.  Prior to Admission medications   Medication Sig Start Date End Date Taking? Authorizing Provider  ELIQUIS 2.5 MG TABS tablet TAKE 1 TABLET BY MOUTH TWICE A DAY 12/16/18  Yes Donita Brooks, MD  fluconazole (DIFLUCAN) 100 MG tablet Take 1 tablet (100 mg total) by mouth daily. FOR 14 DAYS, CRUSH 12/16/18  Yes [place], Velna Hatchet, MD  insulin detemir (LEVEMIR) 100 UNIT/ML injection Inject 0.08 mLs (8 Units total) into the skin at bedtime. Patient taking differently: Inject 10 Units into the skin at bedtime.  12/12/18  Yes [place], Velna Hatchet, MD  Metoclopramide HCl 5 MG TBDP Take 1 tablet (5 mg total) by mouth 3 (three) times daily. 12/16/18  Yes [place], Velna Hatchet, MD  ondansetron (ZOFRAN ODT) 4 MG disintegrating tablet Take 1 tablet (4 mg total) by mouth every 8 (eight) hours as needed for nausea or vomiting. 12/16/18  Yes [place], Velna Hatchet, MD  pantoprazole (PROTONIX) 40 MG tablet TAKE 1 TABLET BY MOUTH EVERY DAY 12/16/18  Yes Donita Brooks, MD  promethazine (PHENERGAN) 12.5 MG suppository Place 1 suppository (12.5 mg total) rectally every 6 (six) hours as needed for nausea or vomiting. 12/16/18  Yes [place], Velna Hatchet, MD  Blood Glucose Monitoring Suppl (BLOOD GLUCOSE SYSTEM PAK) KIT Please dispense based on patient and insurance preference. Use as directed to monitor FSBS 1x daily. Dx: E11.9. 09/03/18   Danelle Berry, PA-C  Glucose Blood (BLOOD GLUCOSE TEST STRIPS) STRP Please dispense based on patient and insurance preference. Use as directed to monitor FSBS 1x daily. Dx: E11.9. 09/03/18   Danelle Berry, PA-C  Insulin Pen Needle (BD PEN NEEDLE MICRO U/F) 32G X 6 MM MISC Use with insulin pen ICD10:e11.43 09/16/18   [name], MD  Lancets MISC Please dispense based on patient and insurance preference. Use as directed to monitor FSBS 1x daily. Dx: E11.9. 09/03/18   Danelle Berry, PA-C       Physical Exam: BP 121/80   Pulse 88   Temp 97.8 F (36.6 C) (Oral)   Resp 13   Ht 5\' 4"  (1.626 m)   Wt 38.1 kg   SpO2 98%   BMI 14.42 kg/m  General appearance: Cachectic adult female, awake and interactive, no obvious pain or  distress.   Eyes: Anicteric, conjunctiva pink, lids and lashes normal. PERRL.    ENT: No nasal deformity, discharge, epistaxis.  Hearing normal.  Edentulous, lips normal, OP moist, thick white plaque on tongue, no surrounding erythema.  Neck: No neck masses.  Trachea midline.  No thyromegaly/tenderness. Lymph: No cervical or supraclavicular lymphadenopathy. Skin: Warm and dry.  No suspicious rashes or lesions. Cardiac: Tachycardic, regular, nl S1-S2, no murmurs appreciated.  Capillary refill is brisk.  JVP not visible.  No LE edema.  Radial pulses 2+ and symmetric. Respiratory: Normal respiratory rate and rhythm.  CTAB without rales or wheezes. Abdomen: Abdomen soft.  No TTP that I can appreciate, but she is guarding. No ascites, distension.   MSK: No deformities or effusions of the large joints of the upper or lower extremities bilaterally.  No cyanosis or clubbing.  There is exteme  Loss of subcutaneous muscle mass and fat, thenar complete wasting, temporal wasting. Neuro: Cranial nerves 3-12 grossly symmetric.  Sensation intact to light touch. Speech is fluent.  Muscle strength 4/5 but symmetric.    Psych: Sensorium intact and responding to questions, attention slightly distracted but interactive.  Not oriented to year, month, place.  Behavior appropriate.  Affect blunted.  Judgment and insight appear impaired.     Labs on Admission:  I have personally reviewed following labs and imaging studies: CBC: Recent Labs  Lab 12/16/18 1538 12/21/18 1850  WBC 5.1 6.2  NEUTROABS 3.7  --   HGB 14.8 15.3*  HCT 47.1* 48.5*  MCV 91.5 90.8  PLT 304 358   Basic Metabolic Panel: Recent Labs  Lab 12/16/18 1538 12/21/18 1850 12/22/18 0310    NA 132* 137 143  K 5.2* 5.4* 3.8  CL 100 107 112*  CO2 21* 17* 27  GLUCOSE 371* 658* 190*  BUN 38* 67* 54*  CREATININE 0.78 1.38* 0.88  CALCIUM 9.6 9.8 8.3*   GFR: Estimated Creatinine Clearance: 28.1 mL/min (by C-G formula based on SCr of 0.88 mg/dL).  Liver Function Tests: Recent Labs  Lab 12/16/18 1538 12/21/18 1850  AST 21 11*  ALT 12 9  ALKPHOS 75 43  BILITOT 0.6 0.4  PROT 8.1 4.1*  ALBUMIN 3.8 2.0*   Recent Labs  Lab 12/16/18 1538 12/21/18 1850  LIPASE 45 32   CBG: Recent Labs  Lab 12/21/18 2340 12/22/18 0045 12/22/18 0146 12/22/18 0238 12/22/18 0728  GLUCAP 375* 228* 224* 167* 139*   Sepsis Labs: Lactic acid 3.01 --> 3.8 --> 2.11         Radiological Exams on Admission: Personally reviewed CT report, summarized above: Ct Abdomen Pelvis Wo Contrast  Result Date: 12/21/2018 CLINICAL DATA:  Acute onset of hyperglycemia. Loose stools. Nausea and vomiting. Increased sleepiness. EXAM: CT ABDOMEN AND PELVIS WITHOUT CONTRAST TECHNIQUE: Multidetector CT imaging of the abdomen and pelvis was performed following the standard protocol without IV contrast. COMPARISON:  PET/CT performed 11/08/2018 FINDINGS: Lower chest: Mild right basilar atelectasis is noted. The visualized portions of the mediastinum are unremarkable. Hepatobiliary: Known hepatic metastases are again noted, though less well characterized on this study. The gallbladder is unremarkable in appearance. The common bile duct remains normal in caliber. Pancreas: The pancreas is within normal limits. Spleen: The spleen is unremarkable in appearance. Adrenals/Urinary Tract: The adrenal glands are unremarkable in appearance. Mild right-sided hydronephrosis is noted, with mild prominence of the right ureter. This could reflect distal obstruction, difficult to fully characterize. A nonobstructing 3 mm stone is noted at the lower pole of the right kidney. The left kidney is unremarkable in appearance. No perinephric  stranding is seen. Stomach/Bowel: There is diffuse dilatation of small-bowel loops to 6.0 cm in maximal diameter, with a focal transition point at the upper pelvis, which may reflect adhesions and associated metastatic disease given underlying mesenteric thickening, on correlation with prior PET/CT. This is concerning for relatively high-grade small bowel obstruction, worsened from the prior study. Distal small bowel loops are decompressed, with surrounding postoperative change. The colon is largely decompressed, aside from a very large amount of stool at the rectum, which measures 9.8 cm  in transverse dimension, concerning for fecal impaction. A G-tube is noted ending at the body of the stomach. The stomach is largely decompressed and grossly unremarkable. Vascular/Lymphatic: Scattered calcification is seen along the abdominal aorta and its branches. The abdominal aorta is otherwise grossly unremarkable. The inferior vena cava is grossly unremarkable. No retroperitoneal lymphadenopathy is seen. No pelvic sidewall lymphadenopathy is identified. Reproductive: The bladder is mildly distended and grossly unremarkable. A ring pessary is noted at the pelvis. The patient is status post hysterectomy. No suspicious adnexal masses are seen. Other: No additional soft tissue abnormalities are seen. Musculoskeletal: No acute osseous abnormalities are identified. Known metastatic lesions within the lower thoracic and lumbar spine, and within the sacrum, are occult on CT. Multilevel vacuum phenomenon is noted along the lumbar spine, with endplate degenerative change and underlying facet disease. The visualized musculature is unremarkable in appearance. IMPRESSION: 1. Diffuse dilatation of small-bowel loops to 6.0 cm in maximal diameter, with a focal transition point at the upper pelvis, which may reflect adhesions and associated metastatic disease given underlying mesenteric thickening, on correlation with prior PET/CT. This is  concerning for high-grade small bowel obstruction, worsened from the prior study. 2. Very large amount of stool noted at the rectum, which measures 9.8 cm in transverse dimension, concerning for fecal impaction. 3. Mild right-sided hydronephrosis, with mild prominence of the right ureter. This could reflect distal obstruction, difficult to fully characterize. 4. Known hepatic metastases again noted, though less well characterized on this study. 5. Known metastatic lesions within the lower thoracic and lumbar spine, and within the sacrum, are occult on CT. 6. Nonobstructing 3 mm stone at the lower pole of the right kidney. Aortic Atherosclerosis (ICD10-I70.0). These results were called by telephone at the time of interpretation on 12/21/2018 at 11:00 pm to Dr. Arby Barrette, who verbally acknowledged these results. Electronically Signed   By: Roanna Raider M.D.   On: 12/21/2018 23:06           Assessment/Plan  Small bowel obstruction  Secondary to metastatic neuroendocrine tumor of SB with carcinomatosis Metastases to bone and liver  This is recurrent in the setting of her neuroendocrine tumor of the small bowel.   She has few if any surgical options. -Consult to General Surgery and Oncology, appreciate cares -Consult to Palliative Care  -G tube to LIS -Soap suds enema  -NPO -IVF  -IV Morphine for pain, ondansetron for nausea     Hydronephrosis  Equivocal finding on CT.  At present no flank pain, renal function improved with fluids, doubt urologically significant ureteral obstruciton.  However, given her carcinomatosis, it is within differential that she may have ureteral obstruction -If fever, obtain urine culture, renal US  Acute kidney injury  Resolved with fluids  Diabetes  Glucoses normalized overnight with IV fluids and insulin infusion.  -Stop insulin gtt -Restart home Levemir -SSI q4hrs correction insulin  Nutrition  -Hold tube feeds for now  Thrush  -Hold  nystatin -Hold oral fluconazole while NPO -IV fluconazole  History DVT  -Hold Eliquis -Lovenox, therapeutic dose  Dementia  Elevated lactate From dehdration, no findings to suggest infection.        DVT prophylaxis: N/A on Lovenox  Code Status: FULL per prior notes, patient unable to state, family not available.  Family Communication: None present; called to family, no answer  Disposition Plan: Anticipate IV fluids, supportive nonoperative management of SBO.  Palliative care consultation for consideration of residential hospice. Consults called: Gen Surg have evaluated patient; Palliative  referral placed in Epic; Oncology team added via Epic Admission status: INPATIENT   At the time of admission, it appears that the appropriate admission status for this patient is INPATIENT. This is judged to be reasonable and necessary in order to provide the required intensity of service to ensure the patient's safety given: -presenting symptoms of vomiting -physical exam findings of tachycardia >110 bpm, and  -initial radiographic and laboratory data small bowel obstruction high grade on CT, Acute kidney injury, elevated lactic acid -in the context of their chronic comorbidities metastatic neuroendocrine tumor with carcinomatosis    Together, these circumstances are felt to place her at high risk for further clinical deterioration threatening life, limb, or organ requiring a high intensity of service due to this acute illness that poses a threat to life, limb or bodily function.  I certify that at the point of admission it is my clinical judgment that the patient will require inpatient hospital care spanning beyond 2 midnights from the point of admission and that early discharge would result in unnecessary risk of decompensation and readmission or threat to life, limb or bodily function.      Medical decision making: Patient seen at 9:00 AM on 12/22/2018.  What exists of the patient's  chart was reviewed in depth and summarized above.  Clinical condition: stable but has incurable cancer, now causing recurrent small bowel obstruction with inability to continue tube feeds possibly, likely end stage, and appropriate for residential hospice.        Earl Lites Jerae Izard [facility] Pager: please page via [email or web address], enter password "[email or web address]", and identify attending under [facility]

## 2018-12-22 NOTE — Consult Note (Signed)
Reason for Consult:SBO Referring Physician: Dr. Isaiah Blakes  Theresa Howe is an 85 y.o. female.  HPI: This is an unfortunate 85 year old female who is known to [place].  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  She currently has a feeding tube in place.  She has had a laparotomy twice by Dr. Barry Dienes in late 2018 in early 2019 with small bowel resections.  Her most recent PET scan in November again demonstrated the liver metastases and skeletal metastases.  There is also suspected bowel mesenteric involvement.  Presented again with nausea, vomiting, and hyperglycemia.  A CT scan was performed showing diffusely dilated small bowel with mesenteric thickening as well as a large amount of stool in the rectum. She was transferred here from [facility] and surgery has been asked to evaluate the patient regarding the bowel obstruction.  She has multiple medical issues include dementia.  Currently, she reports minimal abdominal discomfort.  Past Medical History:  Diagnosis Date  . Abdominal mass 10/2017   SMALL BOWEL  . Diabetes mellitus    type 2  . Dyspnea   . Failure to thrive (0-17) 12/24/2017  . Glaucoma   . Hyperlipidemia   . Hypertension   . Neuropathy   . Osteoporosis   . Wheezing     Past Surgical History:  Procedure Laterality Date  . ABDOMINAL HYSTERECTOMY    . CATARACT EXTRACTION Right 01/2010  . COLON SURGERY    . COLONOSCOPY    . EUS N/A 04/17/2013   Procedure: UPPER ENDOSCOPIC ULTRASOUND (EUS) LINEAR;  Surgeon: Milus Banister, MD;  Location: [facility];  Service: Endoscopy;  Laterality: N/A;  . GLAUCOMA SURGERY  2007  . IR CM INJ ANY COLONIC TUBE W/FLUORO  09/12/2018  . IR FLUORO GUIDE CV LINE RIGHT  01/17/2018  . IR GASTROSTOMY TUBE MOD SED  04/11/2018  . IR IMAGE GUIDED DRAINAGE PERCUT CATH  PERITONEAL RETROPERIT  01/09/2018  . IR REMOVAL TUN CV CATH W/O FL  06/19/2018  . IR SINUS/FIST TUBE CHK-NON GI  01/18/2018  . IR US GUIDE VASC ACCESS RIGHT   01/17/2018  . LAPAROSCOPIC SMALL BOWEL RESECTION  12/04/2017  . LAPAROSCOPIC SMALL BOWEL RESECTION N/A 12/04/2017   Procedure: LAPAROSCOPIC SMALL BOWEL RESECTION;  Surgeon: Stark Klein, MD;  Location: [place];  Service: General;  Laterality: N/A;  . LAPAROSCOPY N/A 12/04/2017   Procedure: LAPAROSCOPY DIAGNOSTIC;  Surgeon: Stark Klein, MD;  Location: [place];  Service: General;  Laterality: N/A;  . LAPAROSCOPY N/A 12/27/2017   Procedure: DIAGNOSTIC LAPAROSCOPY/ LAPAROTOMY WITH SMALL BOWEL RESECTION TIMES TWO;  Surgeon: Stark Klein, MD;  Location: [place];  Service: General;  Laterality: N/A;  . MULTIPLE TOOTH EXTRACTIONS    . PICC LINE INSERTION  12/24/2017    History reviewed. No pertinent family history.  Social History:  reports that she has never smoked. She has never used smokeless tobacco. She reports that she does not drink alcohol or use drugs.  Allergies: No Known Allergies  Medications: I have reviewed the patient's current medications.  Results for orders placed or performed during the hospital encounter of 12/21/18 (from the past 48 hour(s))  CBG monitoring, ED     Status: Abnormal   Collection Time: 12/21/18  6:36 PM  Result Value Ref Range   Glucose-Capillary 523 (HH) 70 - 99 mg/dL   Comment 1 Notify RN    Comment 2 Document in Chart   Basic metabolic panel     Status: Abnormal  Collection Time: 12/21/18  6:50 PM  Result Value Ref Range   Sodium 137 135 - 145 mmol/L   Potassium 5.4 (H) 3.5 - 5.1 mmol/L    Comment: CRITICAL RESULT CALLED TO, READ BACK BY AND VERIFIED WITH:  MAYNARD ,C AT 1954 12/21/18 HASSANR    Chloride 107 98 - 111 mmol/L   CO2 17 (L) 22 - 32 mmol/L   Glucose, Bld 658 (HH) 70 - 99 mg/dL    Comment: CRITICAL RESULT CALLED TO, READ BACK BY AND VERIFIED WITH:  MAYNARD ,C AT 2019 12/21/18 HASSANR RESULTS CONFIRMED BY MANUAL DILUTION    BUN 67 (H) 8 - 23 mg/dL   Creatinine, Ser 1.38 (H) 0.44 - 1.00 mg/dL   Calcium 9.8 8.9 - 10.3 mg/dL   GFR calc non  Af Amer 35 (L) >60 mL/min   GFR calc Af Amer 40 (L) >60 mL/min   Anion gap 13 5 - 15    Comment: Performed at [facility], [address] [place], [place] [address]  CBC     Status: Abnormal   Collection Time: 12/21/18  6:50 PM  Result Value Ref Range   WBC 6.2 4.0 - 10.5 K/uL   RBC 5.34 (H) 3.87 - 5.11 MIL/uL   Hemoglobin 15.3 (H) 12.0 - 15.0 g/dL   HCT 48.5 (H) 36.0 - 46.0 %   MCV 90.8 80.0 - 100.0 fL   MCH 28.7 26.0 - 34.0 pg   MCHC 31.5 30.0 - 36.0 g/dL   RDW 14.2 11.5 - 15.5 %   Platelets 358 150 - 400 K/uL   nRBC 0.0 0.0 - 0.2 %    Comment: Performed at [facility], [address] [place], [place] [address]  Hepatic function panel     Status: Abnormal   Collection Time: 12/21/18  6:50 PM  Result Value Ref Range   Total Protein 4.1 (L) 6.5 - 8.1 g/dL   Albumin 2.0 (L) 3.5 - 5.0 g/dL   AST 11 (L) 15 - 41 U/L   ALT 9 0 - 44 U/L   Alkaline Phosphatase 43 38 - 126 U/L   Total Bilirubin 0.4 0.3 - 1.2 mg/dL   Bilirubin, Direct 0.1 0.0 - 0.2 mg/dL   Indirect Bilirubin 0.3 0.3 - 0.9 mg/dL    Comment: Performed at [facility], [address] [place], [place] [address]  Lipase, blood     Status: None   Collection Time: 12/21/18  6:50 PM  Result Value Ref Range   Lipase 32 11 - 51 U/L    Comment: Performed at [facility], [address] [place], [place] [address]  I-Stat venous blood gas, ED     Status: Abnormal   Collection Time: 12/21/18  9:41 PM  Result Value Ref Range   pH, Ven 7.503 (H) 7.250 - 7.430   pCO2, Ven 32.9 (L) 44.0 - 60.0 mmHg   pO2, Ven 84.0 (H) 32.0 - 45.0 mmHg   Bicarbonate 26.0 20.0 - 28.0 mmol/L   TCO2 27 22 - 32 mmol/L   O2 Saturation 97.0 %   Acid-Base Excess 3.0 (H) 0.0 - 2.0 mmol/L   Patient temperature 97.8 F    Collection site IV START    Drawn by Operator    Sample type VENOUS   I-Stat CG4 Lactic Acid, ED     Status: Abnormal   Collection Time: 12/21/18  9:43 PM  Result Value Ref Range  Lactic Acid, Venous 3.02 (HH) 0.5 - 1.9 mmol/L   Comment NOTIFIED PHYSICIAN   CBG monitoring, ED     Status: Abnormal   Collection Time: 12/21/18 10:36 PM  Result Value Ref Range   Glucose-Capillary 463 (H) 70 - 99 mg/dL  CBG monitoring, ED     Status: Abnormal   Collection Time: 12/21/18 11:40 PM  Result Value Ref Range   Glucose-Capillary 375 (H) 70 - 99 mg/dL  I-Stat CG4 Lactic Acid, ED     Status: Abnormal   Collection Time: 12/21/18 11:58 PM  Result Value Ref Range   Lactic Acid, Venous 3.86 (HH) 0.5 - 1.9 mmol/L   Comment NOTIFIED PHYSICIAN   CBG monitoring, ED     Status: Abnormal   Collection Time: 12/22/18 12:45 AM  Result Value Ref Range   Glucose-Capillary 228 (H) 70 - 99 mg/dL  CBG monitoring, ED     Status: Abnormal   Collection Time: 12/22/18  1:46 AM  Result Value Ref Range   Glucose-Capillary 224 (H) 70 - 99 mg/dL  CBG monitoring, ED     Status: Abnormal   Collection Time: 12/22/18  2:38 AM  Result Value Ref Range   Glucose-Capillary 167 (H) 70 - 99 mg/dL  I-Stat CG4 Lactic Acid, ED     Status: Abnormal   Collection Time: 12/22/18  2:54 AM  Result Value Ref Range   Lactic Acid, Venous 2.11 (HH) 0.5 - 1.9 mmol/L   Comment NOTIFIED PHYSICIAN   Basic metabolic panel     Status: Abnormal   Collection Time: 12/22/18  3:10 AM  Result Value Ref Range   Sodium 143 135 - 145 mmol/L   Potassium 3.8 3.5 - 5.1 mmol/L   Chloride 112 (H) 98 - 111 mmol/L    Comment: DELTA CHECK NOTED   CO2 27 22 - 32 mmol/L   Glucose, Bld 190 (H) 70 - 99 mg/dL   BUN 54 (H) 8 - 23 mg/dL   Creatinine, Ser 0.88 0.44 - 1.00 mg/dL   Calcium 8.3 (L) 8.9 - 10.3 mg/dL   GFR calc non Af Amer 60 (L) >60 mL/min   GFR calc Af Amer >60 >60 mL/min   Anion gap 4 (L) 5 - 15    Comment: Performed at [facility], [address] [place], [place] [address]  Glucose, capillary     Status: Abnormal   Collection Time: 12/22/18  7:28 AM  Result Value Ref Range   Glucose-Capillary 139 (H) 70 -  99 mg/dL    Ct Abdomen Pelvis Wo Contrast  Result Date: 12/21/2018 CLINICAL DATA:  Acute onset of hyperglycemia. Loose stools. Nausea and vomiting. Increased sleepiness. EXAM: CT ABDOMEN AND PELVIS WITHOUT CONTRAST TECHNIQUE: Multidetector CT imaging of the abdomen and pelvis was performed following the standard protocol without IV contrast. COMPARISON:  PET/CT performed 11/08/2018 FINDINGS: Lower chest: Mild right basilar atelectasis is noted. The visualized portions of the mediastinum are unremarkable. Hepatobiliary: Known hepatic metastases are again noted, though less well characterized on this study. The gallbladder is unremarkable in appearance. The common bile duct remains normal in caliber. Pancreas: The pancreas is within normal limits. Spleen: The spleen is unremarkable in appearance. Adrenals/Urinary Tract: The adrenal glands are unremarkable in appearance. Mild right-sided hydronephrosis is noted, with mild prominence of the right ureter. This could reflect distal obstruction, difficult to fully characterize. A nonobstructing 3 mm stone is noted at the lower pole of the right kidney. The left kidney is unremarkable in  appearance. No perinephric stranding is seen. Stomach/Bowel: There is diffuse dilatation of small-bowel loops to 6.0 cm in maximal diameter, with a focal transition point at the upper pelvis, which may reflect adhesions and associated metastatic disease given underlying mesenteric thickening, on correlation with prior PET/CT. This is concerning for relatively high-grade small bowel obstruction, worsened from the prior study. Distal small bowel loops are decompressed, with surrounding postoperative change. The colon is largely decompressed, aside from a very large amount of stool at the rectum, which measures 9.8 cm in transverse dimension, concerning for fecal impaction. A G-tube is noted ending at the body of the stomach. The stomach is largely decompressed and grossly unremarkable.  Vascular/Lymphatic: Scattered calcification is seen along the abdominal aorta and its branches. The abdominal aorta is otherwise grossly unremarkable. The inferior vena cava is grossly unremarkable. No retroperitoneal lymphadenopathy is seen. No pelvic sidewall lymphadenopathy is identified. Reproductive: The bladder is mildly distended and grossly unremarkable. A ring pessary is noted at the pelvis. The patient is status post hysterectomy. No suspicious adnexal masses are seen. Other: No additional soft tissue abnormalities are seen. Musculoskeletal: No acute osseous abnormalities are identified. Known metastatic lesions within the lower thoracic and lumbar spine, and within the sacrum, are occult on CT. Multilevel vacuum phenomenon is noted along the lumbar spine, with endplate degenerative change and underlying facet disease. The visualized musculature is unremarkable in appearance. IMPRESSION: 1. Diffuse dilatation of small-bowel loops to 6.0 cm in maximal diameter, with a focal transition point at the upper pelvis, which may reflect adhesions and associated metastatic disease given underlying mesenteric thickening, on correlation with prior PET/CT. This is concerning for high-grade small bowel obstruction, worsened from the prior study. 2. Very large amount of stool noted at the rectum, which measures 9.8 cm in transverse dimension, concerning for fecal impaction. 3. Mild right-sided hydronephrosis, with mild prominence of the right ureter. This could reflect distal obstruction, difficult to fully characterize. 4. Known hepatic metastases again noted, though less well characterized on this study. 5. Known metastatic lesions within the lower thoracic and lumbar spine, and within the sacrum, are occult on CT. 6. Nonobstructing 3 mm stone at the lower pole of the right kidney. Aortic Atherosclerosis (ICD10-I70.0). These results were called by telephone at the time of interpretation on 12/21/2018 at 11:00 pm to Dr.  Charlesetta Shanks, who verbally acknowledged these results. Electronically Signed   By: Garald Balding M.D.   On: 12/21/2018 23:06    Review of Systems  Unable to perform ROS: Dementia   Blood pressure 121/80, pulse 88, temperature 97.8 F (36.6 C), temperature source Oral, resp. rate 13, height 5\' 4"  (1.626 m), weight 38.1 kg, SpO2 98 %. Physical Exam  Constitutional: No distress.  Thin, elderly female in no distress  HENT:  Head: Normocephalic and atraumatic.  Right Ear: External ear normal.  Left Ear: External ear normal.  Nose: Nose normal.  Mouth/Throat: Oropharynx is clear and moist.  Eyes: Pupils are equal, round, and reactive to light. Right eye exhibits no discharge. Left eye exhibits no discharge. No scleral icterus.  Neck: Normal range of motion. Neck supple. No tracheal deviation present.  Cardiovascular: Normal rate, regular rhythm and normal heart sounds.  Respiratory: Effort normal and breath sounds normal. No respiratory distress. She exhibits no tenderness.  GI: Soft. She exhibits no distension. There is no abdominal tenderness.  Her abdomen is surprisingly not distended and there is no real tenderness or guarding  Musculoskeletal: Normal range of motion.  General: No edema.  Neurological: She is alert.  Skin: Skin is warm. No erythema.  Psychiatric: Her behavior is normal.    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  I suggest that the feeding tube be placed to suction.  I would recommend enemas for her constipation.  I would also recommend a palliative care consult if this is not been done.  There may be little role for surgery at this point.  We will follow with you.  Coralie Keens 12/22/2018, 7:37 AM

## 2018-12-22 NOTE — ED Notes (Signed)
[phone] Theresa Howe (Niece)

## 2018-12-22 NOTE — ED Notes (Signed)
EDP notified of CBG. Insulin drip d/c per v/o from EDP.

## 2018-12-22 NOTE — ED Notes (Signed)
Family updated on pt getting room assignment.

## 2018-12-22 NOTE — ED Provider Notes (Signed)
Care assumed from Dr. Vallery Ridge, patient scheduled to be admitted to stepdown unit for hyperglycemia and acute kidney injury.  However, no stepdown beds available at [facility].  Also queried [facility], [facility] in [place], [facility] in [place], [facility] in [place].  All had no stepdown beds available.  Patient was noted to have a rising lactate and was given IV fluids.  Lactate was repeated and has come down considerably, but not quite to normal.  With fluids, there also was normalization of creatinine and CO2, and BUN decreased somewhat.  Potassium also decreased to the normal range.  Glucose had come down to about 200, patient was taken off of glucose stabilizer.  Case was then discussed with Dr. Myna Hidalgo at [facility].  Patient was felt to be stable for telemetry bed at this point, no longer needing stepdown.  Dr. Myna Hidalgo agreed, and arrangements are made to admit the patient to [facility].  CRITICAL CARE Performed by: Delora Fuel Total critical care time: 45 minutes Critical care time was exclusive of separately billable procedures and treating other patients. Critical care was necessary to treat or prevent imminent or life-threatening deterioration. Critical care was time spent personally by me on the following activities: development of treatment plan with patient and/or surrogate as well as nursing, discussions with consultants, evaluation of patient's response to treatment, examination of patient, obtaining history from patient or surrogate, ordering and performing treatments and interventions, ordering and review of laboratory studies, ordering and review of radiographic studies, pulse oximetry and re-evaluation of patient's condition.   Delora Fuel, MD [birth year] [phone]

## 2018-12-23 ENCOUNTER — Inpatient Hospital Stay (HOSPITAL_COMMUNITY): Payer: Medicare Other

## 2018-12-23 LAB — GLUCOSE, CAPILLARY
GLUCOSE-CAPILLARY: 48 mg/dL — AB (ref 70–99)
Glucose-Capillary: 106 mg/dL — ABNORMAL HIGH (ref 70–99)
Glucose-Capillary: 111 mg/dL — ABNORMAL HIGH (ref 70–99)
Glucose-Capillary: 118 mg/dL — ABNORMAL HIGH (ref 70–99)
Glucose-Capillary: 121 mg/dL — ABNORMAL HIGH (ref 70–99)
Glucose-Capillary: 235 mg/dL — ABNORMAL HIGH (ref 70–99)
Glucose-Capillary: 58 mg/dL — ABNORMAL LOW (ref 70–99)
Glucose-Capillary: 65 mg/dL — ABNORMAL LOW (ref 70–99)
Glucose-Capillary: 94 mg/dL (ref 70–99)

## 2018-12-23 LAB — BASIC METABOLIC PANEL
Anion gap: 8 (ref 5–15)
BUN: 34 mg/dL — ABNORMAL HIGH (ref 8–23)
CO2: 27 mmol/L (ref 22–32)
Calcium: 7.9 mg/dL — ABNORMAL LOW (ref 8.9–10.3)
Chloride: 113 mmol/L — ABNORMAL HIGH (ref 98–111)
Creatinine, Ser: 0.86 mg/dL (ref 0.44–1.00)
GFR calc Af Amer: >60 mL/min (ref >60)
GFR calc non Af Amer: >60 mL/min (ref >60)
Glucose, Bld: 305 mg/dL — ABNORMAL HIGH (ref 70–99)
Potassium: 3.4 mmol/L — ABNORMAL LOW (ref 3.5–5.1)
Sodium: 148 mmol/L — ABNORMAL HIGH (ref 135–145)

## 2018-12-23 LAB — CBC
HEMATOCRIT: 35.4 % — AB (ref 36.0–46.0)
Hemoglobin: 11.1 g/dL — ABNORMAL LOW (ref 12.0–15.0)
MCH: 29.3 pg (ref 26.0–34.0)
MCHC: 31.4 g/dL (ref 30.0–36.0)
MCV: 93.4 fL (ref 80.0–100.0)
Platelets: 237 10*3/uL (ref 150–400)
RBC: 3.79 MIL/uL — ABNORMAL LOW (ref 3.87–5.11)
RDW: 14.2 % (ref 11.5–15.5)
WBC: 4.3 10*3/uL (ref 4.0–10.5)
nRBC: 0 % (ref 0.0–0.2)

## 2018-12-23 MED ORDER — NYSTATIN 100000 UNIT/ML MT SUSP
5.0000 mL | Freq: Four times a day (QID) | OROMUCOSAL | Status: DC
  Administered 2018-12-23 (×2): 500000 [IU] via ORAL
  Filled 2018-12-23: qty 5

## 2018-12-23 MED ORDER — DEXTROSE 50 % IV SOLN
25.0000 g | INTRAVENOUS | Status: AC
  Administered 2018-12-23: 25 g via INTRAVENOUS
  Filled 2018-12-23: qty 50

## 2018-12-23 MED ORDER — SODIUM CHLORIDE 0.45 % IV SOLN
INTRAVENOUS | Status: DC
  Administered 2018-12-23: 10:00:00 via INTRAVENOUS

## 2018-12-23 MED ORDER — POTASSIUM CHLORIDE 10 MEQ/100ML IV SOLN
10.0000 meq | INTRAVENOUS | Status: AC
  Administered 2018-12-23 (×3): 10 meq via INTRAVENOUS
  Filled 2018-12-23 (×2): qty 100

## 2018-12-23 MED ORDER — DEXTROSE 5 % IV SOLN
INTRAVENOUS | Status: DC
  Administered 2018-12-23 – 2018-12-25 (×4): via INTRAVENOUS

## 2018-12-23 NOTE — Progress Notes (Signed)
Initial Nutrition Assessment  DOCUMENTATION CODES:   Underweight, Severe malnutrition in context of chronic illness  INTERVENTION:   Vital 1.5 @ 30 ml/hr x 18 hours via G-tube Increase by 10 ml Q8 hours to goal rate of 60ml/hr x 18 hrs (1080 ml)  Provides: 1620 kcals, 73 grams protein, 821 ml free water. Meets 100% of needs.   Monitor magnesium, potassium, and phosphorus daily for at least 3 days, MD to replete as needed, as pt is at risk for refeeding syndrome.   NUTRITION DIAGNOSIS:   Severe Malnutrition related to chronic illness, cancer and cancer related treatments as evidenced by percent weight loss, severe fat depletion, severe muscle depletion.  GOAL:   Patient will meet greater than or equal to 90% of their needs  MONITOR:   Skin, TF tolerance, Weight trends, Labs, I & O's, Diet advancement  REASON FOR ASSESSMENT:   New TF   ASSESSMENT:   Patient with PMH significant for DM, FTT, HLD, HTN, and neuroendocrine tumor with carcinomatosis s/p 2 ex laps for bowel resection by Dr. Byerly, and metastases to the bone and liver. Recently admitted 10/24 for SBO likely due to adhesions.  Presents this admission with SBO secondary to metastatic neuroendocrine tumor.    Caregiver at bedside provided history. Pt does not take anything by mouth except sips of water. States PTA pt received Vital 1.5 @ 50 ml/hr from 830 am to 430 pm daily with 2 Promods (provides 600 kcal, 47 grams protein, and 306 ml free water). Caretaker states pt's family decreased volume from 60 ml to 50 ml as they felt 60 ml was too much volume. The pump is usually turned on before family goes to work and shut off when they come home. Suspect pt has not met nutrition needs in prolonged period. Weight shows to have decreased from 95 lb to 84 lb over the last three months (11.6% wt loss).   During previous admission this RD recommended Vital 1.5 @ 60 ml/hr x 18 hrs to provide 1620 kcal and 73 grams of protein (meets  100% of needs). Pt is currently NPO with G-tube to suction. If within GOC and TF is desired, would recommend following this regimen.  If pt continues TF at home, family needs to be educated on correct volume administration or TF needs to be adjusted.   Medications reviewed and include: SSI, levemir, D5 @ 75 ml/hr Labs reviewed: Na 148 (H) K 3.4 (L) CBG 48-235  NUTRITION - FOCUSED PHYSICAL EXAM:    Most Recent Value  Orbital Region  Mild depletion  Upper Arm Region  Severe depletion  Thoracic and Lumbar Region  Severe depletion  Buccal Region  Severe depletion  Temple Region  Severe depletion  Clavicle Bone Region  Severe depletion  Clavicle and Acromion Bone Region  Severe depletion  Scapular Bone Region  Severe depletion  Dorsal Hand  Severe depletion  Patellar Region  Severe depletion  Anterior Thigh Region  Severe depletion  Posterior Calf Region  Severe depletion  Edema (RD Assessment)  None  Hair  Reviewed  Eyes  Reviewed  Mouth  Reviewed  Skin  Reviewed  Nails  Reviewed     Diet Order:   Diet Order            Diet NPO time specified  Diet effective now              EDUCATION NEEDS:   Education needs have been addressed  Skin:  Skin Assessment: Skin Integrity   Issues: Skin Integrity Issues:: Stage II Stage II: buttocks  Last BM:  12/22/18  Height:   Ht Readings from Last 1 Encounters:  12/21/18 5' 4" (1.626 m)    Weight:   Wt Readings from Last 1 Encounters:  12/21/18 38.1 kg    Ideal Body Weight:  54.5 kg  BMI:  Body mass index is 14.42 kg/m.  Estimated Nutritional Needs:   Kcal:  1500-1700 kcal  Protein:  70-85 grams  Fluid:  >/= 1.5 L/day     RD, LDN Clinical Nutrition Pager # - [phone]  

## 2018-12-23 NOTE — Progress Notes (Addendum)
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 CT scan show high-grade small bowel obstruction, large stool burden, possible right hydronephrosis.  He was also started on an insulin drip, discussed with general surgery and accepted to hospitalist service.    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  G tube to suction.. N.p.o. Surgery following. KUB. Continue with IV fluids. Palliative  consult.  Hydronephrosis;  Renal function stable.  AKI: Improved with fluids.  DM; was initially on insulin drip.  She is now on Levemir. He dropped to 60.  With put on low rate IV fluids with D5. Might need to figure hold Levemir tomorrow if hypoglycemia.   Nutrition; n.p.o. for now.  Hypernatremia; IV fluids  Thrush; continue with IV Diflucan Resume nystatin  History of DVT;  Holding eliquis.  On Lovenox.    Pressure Injury 12/13/17 Stage III -  Full thickness tissue loss. Subcutaneous fat may be visible but bone, tendon or muscle are NOT exposed. (Active)  12/13/17 1500  Location: Coccyx  Location Orientation:   Staging: Stage III -  Full thickness tissue loss. Subcutaneous fat may  be visible but bone, tendon or muscle are NOT exposed.  Wound Description (Comments):   Present on Admission: No     Pressure Injury 12/13/17 Stage II -  Partial thickness loss of dermis presenting as a shallow open ulcer with a red, pink wound bed without slough. (Active)  12/13/17 1500  Location: Buttocks  Location Orientation: Right  Staging: Stage II -  Partial thickness loss of dermis presenting as a shallow open ulcer with a red, pink wound bed without slough.  Wound Description (Comments):   Present on Admission: No     Estimated body mass index is 14.42 kg/m as calculated from the following:   Height as of this encounter: 5\' 4"  (1.626 m).   Weight as of this encounter: 38.1 kg.   DVT prophylaxis: Lovenox Code Status: Full code Family Communication: updated niece over phone, will ask palliative care consult.  Disposition Plan: remain in patient.   Consultants:  Surgery   Procedures:  No   Antimicrobials:  none   Subjective: Pleasantly confuse. denies abdominal pain.   Objective: Vitals:   12/22/18 0623 12/22/18 1345 12/22/18 2133 12/23/18 0542  BP: 121/80 134/80 140/81 129/85  Pulse:  85 86 76  Resp: 13 16 16 16   Temp:  97.8 F (36.6 C) 98 F (36.7 C)   TempSrc:  Oral Oral   SpO2:  99% 98% 99%  Weight:      Height:        Intake/Output Summary (Last 24 hours) at 12/23/2018 0836 Last data filed at 12/23/2018 0600 Gross per 24 hour  Intake 1327.69  ml  Output 0 ml  Net 1327.69 ml   Filed Weights   12/21/18 1836  Weight: 38.1 kg    Examination:  General exam: Appears calm and comfortable  Respiratory system: Clear to auscultation. Respiratory effort normal. Cardiovascular system: S1 & S2 heard, RRR.  Gastrointestinal system: Abdomen is nondistended, G tube in place.  Central nervous system: Alert  Extremities: no edema Skin: No rashes, lesions or ulcers  Data Reviewed: I have personally reviewed following labs and imaging  studies  CBC: Recent Labs  Lab 12/16/18 1538 12/21/18 1850 12/23/18 0436  WBC 5.1 6.2 4.3  NEUTROABS 3.7  --   --   HGB 14.8 15.3* 11.1*  HCT 47.1* 48.5* 35.4*  MCV 91.5 90.8 93.4  PLT 304 358 237   Basic Metabolic Panel: Recent Labs  Lab 12/16/18 1538 12/21/18 1850 12/22/18 0310 12/23/18 0436  NA 132* 137 143 148*  K 5.2* 5.4* 3.8 3.4*  CL 100 107 112* 113*  CO2 21* 17* 27 27  GLUCOSE 371* 658* 190* 305*  BUN 38* 67* 54* 34*  CREATININE 0.78 1.38* 0.88 0.86  CALCIUM 9.6 9.8 8.3* 7.9*   GFR: Estimated Creatinine Clearance: 28.8 mL/min (by C-G formula based on SCr of 0.86 mg/dL). Liver Function Tests: Recent Labs  Lab 12/16/18 1538 12/21/18 1850  AST 21 11*  ALT 12 9  ALKPHOS 75 43  BILITOT 0.6 0.4  PROT 8.1 4.1*  ALBUMIN 3.8 2.0*   Recent Labs  Lab 12/16/18 1538 12/21/18 1850  LIPASE 45 32   No results for input(s): AMMONIA in the last 168 hours. Coagulation Profile: No results for input(s): INR, PROTIME in the last 168 hours. Cardiac Enzymes: No results for input(s): CKTOTAL, CKMB, CKMBINDEX, TROPONINI in the last 168 hours. BNP (last 3 results) No results for input(s): PROBNP in the last 8760 hours. HbA1C: No results for input(s): HGBA1C in the last 72 hours. CBG: Recent Labs  Lab 12/22/18 2020 12/23/18 0021 12/23/18 0410 12/23/18 0451 12/23/18 0744  GLUCAP 162* 94 48* 235* 111*   Lipid Profile: No results for input(s): CHOL, HDL, LDLCALC, TRIG, CHOLHDL, LDLDIRECT in the last 72 hours. Thyroid Function Tests: No results for input(s): TSH, T4TOTAL, FREET4, T3FREE, THYROIDAB in the last 72 hours. Anemia Panel: No results for input(s): VITAMINB12, FOLATE, FERRITIN, TIBC, IRON, RETICCTPCT in the last 72 hours. Sepsis Labs: Recent Labs  Lab 12/16/18 1910 12/21/18 2143 12/21/18 2358 12/22/18 0254  LATICACIDVEN 1.99* 3.02* 3.86* 2.11*    No results found for this or any previous visit (from the past 240 hour(s)).       Radiology  Studies: Ct Abdomen Pelvis Wo Contrast  Result Date: 12/21/2018 CLINICAL DATA:  Acute onset of hyperglycemia. Loose stools. Nausea and vomiting. Increased sleepiness. EXAM: CT ABDOMEN AND PELVIS WITHOUT CONTRAST TECHNIQUE: Multidetector CT imaging of the abdomen and pelvis was performed following the standard protocol without IV contrast. COMPARISON:  PET/CT performed 11/08/2018 FINDINGS: Lower chest: Mild right basilar atelectasis is noted. The visualized portions of the mediastinum are unremarkable. Hepatobiliary: Known hepatic metastases are again noted, though less well characterized on this study. The gallbladder is unremarkable in appearance. The common bile duct remains normal in caliber. Pancreas: The pancreas is within normal limits. Spleen: The spleen is unremarkable in appearance. Adrenals/Urinary Tract: The adrenal glands are unremarkable in appearance. Mild right-sided hydronephrosis is noted, with mild prominence of the right ureter. This could reflect distal obstruction, difficult to fully characterize. A nonobstructing 3 mm stone is noted at the  lower pole of the right kidney. The left kidney is unremarkable in appearance. No perinephric stranding is seen. Stomach/Bowel: There is diffuse dilatation of small-bowel loops to 6.0 cm in maximal diameter, with a focal transition point at the upper pelvis, which may reflect adhesions and associated metastatic disease given underlying mesenteric thickening, on correlation with prior PET/CT. This is concerning for relatively high-grade small bowel obstruction, worsened from the prior study. Distal small bowel loops are decompressed, with surrounding postoperative change. The colon is largely decompressed, aside from a very large amount of stool at the rectum, which measures 9.8 cm in transverse dimension, concerning for fecal impaction. A G-tube is noted ending at the body of the stomach. The stomach is largely decompressed and grossly unremarkable.  Vascular/Lymphatic: Scattered calcification is seen along the abdominal aorta and its branches. The abdominal aorta is otherwise grossly unremarkable. The inferior vena cava is grossly unremarkable. No retroperitoneal lymphadenopathy is seen. No pelvic sidewall lymphadenopathy is identified. Reproductive: The bladder is mildly distended and grossly unremarkable. A ring pessary is noted at the pelvis. The patient is status post hysterectomy. No suspicious adnexal masses are seen. Other: No additional soft tissue abnormalities are seen. Musculoskeletal: No acute osseous abnormalities are identified. Known metastatic lesions within the lower thoracic and lumbar spine, and within the sacrum, are occult on CT. Multilevel vacuum phenomenon is noted along the lumbar spine, with endplate degenerative change and underlying facet disease. The visualized musculature is unremarkable in appearance. IMPRESSION: 1. Diffuse dilatation of small-bowel loops to 6.0 cm in maximal diameter, with a focal transition point at the upper pelvis, which may reflect adhesions and associated metastatic disease given underlying mesenteric thickening, on correlation with prior PET/CT. This is concerning for high-grade small bowel obstruction, worsened from the prior study. 2. Very large amount of stool noted at the rectum, which measures 9.8 cm in transverse dimension, concerning for fecal impaction. 3. Mild right-sided hydronephrosis, with mild prominence of the right ureter. This could reflect distal obstruction, difficult to fully characterize. 4. Known hepatic metastases again noted, though less well characterized on this study. 5. Known metastatic lesions within the lower thoracic and lumbar spine, and within the sacrum, are occult on CT. 6. Nonobstructing 3 mm stone at the lower pole of the right kidney. Aortic Atherosclerosis (ICD10-I70.0). These results were called by telephone at the time of interpretation on 12/21/2018 at 11:00 pm to Dr.  Arby Barrette, who verbally acknowledged these results. Electronically Signed   By: Roanna Raider M.D.   On: 12/21/2018 23:06        Scheduled Meds: . enoxaparin (LOVENOX) injection  1 mg/kg Subcutaneous Q24H  . insulin aspart  0-9 Units Subcutaneous Q4H  . insulin detemir  10 Units Subcutaneous QHS   Continuous Infusions: . fluconazole (DIFLUCAN) IV Stopped (12/22/18 1240)  . lactated ringers 75 mL/hr at 12/23/18 0600     LOS: 2 days    Time spent: 35 minutes.     Alba Cory, MD [facility] Pager [phone]  If 7PM-7AM, please contact night-coverage [email or web address] Password [facility] 12/23/2018, 8:36 AM

## 2018-12-23 NOTE — Progress Notes (Signed)
Patient ID: Theresa Howe, female   DOB: [birth year], 85 y.o.   MRN: [id number]       Subjective: Patient with no complaints. No nausea.  Unclear if she is passing flatus.  No g-tube output recorded but not output in cannister  Objective: Vital signs in last 24 hours: Temp:  [97.8 F (36.6 C)-98 F (36.7 C)] 98 F (36.7 C) (01/05 2133) Pulse Rate:  [76-86] 76 (01/06 0542) Resp:  [16] 16 (01/06 0542) BP: (129-140)/(80-85) 129/85 (01/06 0542) SpO2:  [98 %-99 %] 99 % (01/06 0542) Last BM Date: 12/22/18  Intake/Output from previous day: 01/05 0701 - 01/06 0700 In: 1327.7 [I.V.:1277.7; IV Piggyback:50] Out: 0  Intake/Output this shift: No intake/output data recorded.  PE: Heart: regular Lungs: CTAB Abd: soft, NT, g-tube to suction with essentially not output, great BS  Lab Results:  Recent Labs    12/21/18 1850 12/23/18 0436  WBC 6.2 4.3  HGB 15.3* 11.1*  HCT 48.5* 35.4*  PLT 358 237   BMET Recent Labs    12/22/18 0310 12/23/18 0436  NA 143 148*  K 3.8 3.4*  CL 112* 113*  CO2 27 27  GLUCOSE 190* 305*  BUN 54* 34*  CREATININE 0.88 0.86  CALCIUM 8.3* 7.9*   PT/INR No results for input(s): LABPROT, INR in the last 72 hours. CMP     Component Value Date/Time   NA 148 (H) 12/23/2018 0436   K 3.4 (L) 12/23/2018 0436   CL 113 (H) 12/23/2018 0436   CO2 27 12/23/2018 0436   GLUCOSE 305 (H) 12/23/2018 0436   BUN 34 (H) 12/23/2018 0436   CREATININE 0.86 12/23/2018 0436   CREATININE 0.92 10/18/2018 1321   CREATININE 0.88 09/03/2018 1519   CALCIUM 7.9 (L) 12/23/2018 0436   PROT 4.1 (L) 12/21/2018 1850   ALBUMIN 2.0 (L) 12/21/2018 1850   AST 11 (L) 12/21/2018 1850   AST 22 10/18/2018 1321   ALT 9 12/21/2018 1850   ALT 26 10/18/2018 1321   ALKPHOS 43 12/21/2018 1850   BILITOT 0.4 12/21/2018 1850   BILITOT 0.7 10/18/2018 1321   GFRNONAA >60 12/23/2018 0436   GFRNONAA 55 (L) 10/18/2018 1321   GFRNONAA 60 09/03/2018 1519   GFRAA >60 12/23/2018 0436   GFRAA  >60 10/18/2018 1321   GFRAA 69 09/03/2018 1519   Lipase     Component Value Date/Time   LIPASE 32 12/21/2018 1850       Studies/Results: Ct Abdomen Pelvis Wo Contrast  Result Date: 12/21/2018 CLINICAL DATA:  Acute onset of hyperglycemia. Loose stools. Nausea and vomiting. Increased sleepiness. EXAM: CT ABDOMEN AND PELVIS WITHOUT CONTRAST TECHNIQUE: Multidetector CT imaging of the abdomen and pelvis was performed following the standard protocol without IV contrast. COMPARISON:  PET/CT performed 11/08/2018 FINDINGS: Lower chest: Mild right basilar atelectasis is noted. The visualized portions of the mediastinum are unremarkable. Hepatobiliary: Known hepatic metastases are again noted, though less well characterized on this study. The gallbladder is unremarkable in appearance. The common bile duct remains normal in caliber. Pancreas: The pancreas is within normal limits. Spleen: The spleen is unremarkable in appearance. Adrenals/Urinary Tract: The adrenal glands are unremarkable in appearance. Mild right-sided hydronephrosis is noted, with mild prominence of the right ureter. This could reflect distal obstruction, difficult to fully characterize. A nonobstructing 3 mm stone is noted at the lower pole of the right kidney. The left kidney is unremarkable in appearance. No perinephric stranding is seen. Stomach/Bowel: There is diffuse dilatation of small-bowel loops to  6.0 cm in maximal diameter, with a focal transition point at the upper pelvis, which may reflect adhesions and associated metastatic disease given underlying mesenteric thickening, on correlation with prior PET/CT. This is concerning for relatively high-grade small bowel obstruction, worsened from the prior study. Distal small bowel loops are decompressed, with surrounding postoperative change. The colon is largely decompressed, aside from a very large amount of stool at the rectum, which measures 9.8 cm in transverse dimension, concerning for  fecal impaction. A G-tube is noted ending at the body of the stomach. The stomach is largely decompressed and grossly unremarkable. Vascular/Lymphatic: Scattered calcification is seen along the abdominal aorta and its branches. The abdominal aorta is otherwise grossly unremarkable. The inferior vena cava is grossly unremarkable. No retroperitoneal lymphadenopathy is seen. No pelvic sidewall lymphadenopathy is identified. Reproductive: The bladder is mildly distended and grossly unremarkable. A ring pessary is noted at the pelvis. The patient is status post hysterectomy. No suspicious adnexal masses are seen. Other: No additional soft tissue abnormalities are seen. Musculoskeletal: No acute osseous abnormalities are identified. Known metastatic lesions within the lower thoracic and lumbar spine, and within the sacrum, are occult on CT. Multilevel vacuum phenomenon is noted along the lumbar spine, with endplate degenerative change and underlying facet disease. The visualized musculature is unremarkable in appearance. IMPRESSION: 1. Diffuse dilatation of small-bowel loops to 6.0 cm in maximal diameter, with a focal transition point at the upper pelvis, which may reflect adhesions and associated metastatic disease given underlying mesenteric thickening, on correlation with prior PET/CT. This is concerning for high-grade small bowel obstruction, worsened from the prior study. 2. Very large amount of stool noted at the rectum, which measures 9.8 cm in transverse dimension, concerning for fecal impaction. 3. Mild right-sided hydronephrosis, with mild prominence of the right ureter. This could reflect distal obstruction, difficult to fully characterize. 4. Known hepatic metastases again noted, though less well characterized on this study. 5. Known metastatic lesions within the lower thoracic and lumbar spine, and within the sacrum, are occult on CT. 6. Nonobstructing 3 mm stone at the lower pole of the right kidney. Aortic  Atherosclerosis (ICD10-I70.0). These results were called by telephone at the time of interpretation on 12/21/2018 at 11:00 pm to Dr. Arby Barrette, who verbally acknowledged these results. Electronically Signed   By: Roanna Raider M.D.   On: 12/21/2018 23:06   Dg Abd 1 View  Result Date: 12/23/2018 CLINICAL DATA:  Abdominal pain EXAM: ABDOMEN - 1 VIEW COMPARISON:  12/21/2018 FINDINGS: Scattered large and small bowel gas is noted. Mild small bowel prominence is noted similar to that seen on the prior exam. Pessary is noted in place. Degenerative changes of lumbar spine are noted. Postsurgical changes as well as a gastrostomy are seen. IMPRESSION: Mild small bowel dilatation is noted. No free air is seen. Continued follow-up is recommended. Electronically Signed   By: Alcide Clever M.D.   On: 12/23/2018 09:50    Anti-infectives: Anti-infectives (From admission, onward)   Start     Dose/Rate Route Frequency Ordered Stop   12/22/18 1000  fluconazole (DIFLUCAN) IVPB 100 mg     100 mg 50 mL/hr over 60 Minutes Intravenous Every 24 hours 12/22/18 0859         Assessment/Plan H/O neuroendocrine tumor, s/p resection x2 by Dr. Donell Beers Dementia DM History of DVT AKI  SBO likely secondary to carcinomatosis, recurrently -minimal NGT output, but with some dilated small bowel still present, but improved. -could probably clamp g-tube and see  how she does. -agree with palliative care consult as this is about her third admission in as many months or so secondary to recurrent SBOs.  She is likely not a surgical candidate with little else to offer her.  FEN - NPO with g-tube to suction for now VTE - Lovenox ID - diflucan/nystatin   LOS: 2 days    Letha Cape , [facility] Surgery 12/23/2018, 10:06 AM Pager: [phone]

## 2018-12-24 ENCOUNTER — Inpatient Hospital Stay (HOSPITAL_COMMUNITY): Payer: Medicare Other

## 2018-12-24 DIAGNOSIS — Z515 Encounter for palliative care: Secondary | ICD-10-CM

## 2018-12-24 LAB — BASIC METABOLIC PANEL
Anion gap: 6 (ref 5–15)
BUN: 20 mg/dL (ref 8–23)
CO2: 32 mmol/L (ref 22–32)
Calcium: 8.9 mg/dL (ref 8.9–10.3)
Chloride: 103 mmol/L (ref 98–111)
Creatinine, Ser: 0.87 mg/dL (ref 0.44–1.00)
GFR calc Af Amer: >60 mL/min (ref >60)
GFR calc non Af Amer: >60 mL/min (ref >60)
Glucose, Bld: 128 mg/dL — ABNORMAL HIGH (ref 70–99)
Potassium: 3.5 mmol/L (ref 3.5–5.1)
SODIUM: 141 mmol/L (ref 135–145)

## 2018-12-24 LAB — CBC
HCT: 39.1 % (ref 36.0–46.0)
Hemoglobin: 12.3 g/dL (ref 12.0–15.0)
MCH: 29.7 pg (ref 26.0–34.0)
MCHC: 31.5 g/dL (ref 30.0–36.0)
MCV: 94.4 fL (ref 80.0–100.0)
NRBC: 0 % (ref 0.0–0.2)
Platelets: 235 10*3/uL (ref 150–400)
RBC: 4.14 MIL/uL (ref 3.87–5.11)
RDW: 14.1 % (ref 11.5–15.5)
WBC: 3.1 10*3/uL — ABNORMAL LOW (ref 4.0–10.5)

## 2018-12-24 LAB — GLUCOSE, CAPILLARY
Glucose-Capillary: 114 mg/dL — ABNORMAL HIGH (ref 70–99)
Glucose-Capillary: 149 mg/dL — ABNORMAL HIGH (ref 70–99)
Glucose-Capillary: 73 mg/dL (ref 70–99)
Glucose-Capillary: 87 mg/dL (ref 70–99)
Glucose-Capillary: 98 mg/dL (ref 70–99)
Glucose-Capillary: 99 mg/dL (ref 70–99)

## 2018-12-24 MED ORDER — ACETAMINOPHEN 10 MG/ML IV SOLN
1000.0000 mg | Freq: Four times a day (QID) | INTRAVENOUS | Status: AC | PRN
  Administered 2018-12-24: 1000 mg via INTRAVENOUS
  Filled 2018-12-24: qty 100

## 2018-12-24 MED ORDER — MORPHINE SULFATE (PF) 2 MG/ML IV SOLN
1.0000 mg | INTRAVENOUS | Status: DC | PRN
  Administered 2018-12-24: 1 mg via INTRAVENOUS
  Filled 2018-12-24: qty 1

## 2018-12-24 NOTE — Consult Note (Signed)
Consultation Note Date: 12/24/2018   Patient Name: Theresa Howe  DOB: 05/14/1931  MRN: [id number]  Age / Sex: 85 y.o., female  PCP: [name], MD Referring Physician: Alba Cory, MD  Reason for Consultation: Establishing goals of care  HPI/Patient Profile: 85 y.o. female  with past medical history of neuro endocrine tumor s/p resection  admitted on 12/21/2018 with SBO .   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  CT scan show high-grade small bowel obstruction, large stool burden, possible right hydronephrosis.   Surgery is following, the patient has G-tube to gravity drainage for comfort, she is not a surgical candidate, she has plain films with ongoing SBO pattern.   She remains NPO with G tube suction.   A palliative consult has been requested for goals of care discussions.   The patient is resting in bed. She is awake alert. She answers few questions appropriately. Her sitter Elease Hashimoto is at the bedside.   The patient denies abdominal pain currently, states that it comes and goes. She is tolerating a few ice chips, states that she wants to eat.   Call placed and discussed with nephew Veronia Beets who is the patient's decision maker. I introduced myself and palliative care as follows: Palliative medicine is specialized medical care for people living with serious illness. It focuses on providing relief from the symptoms and stress of a serious illness. The goal is to improve quality of life for both the patient and the family.  I discussed with him about the patient's current condition, her underlying malignancy.  Goals, wishes and values attempted to be elicited. Discussed with him about DNR DNI, pursuing comfort care, sips/bites to be allowed for comfort and for him to consider hospice philosophy of care.   Nephew states that both him and his first cousin, the patient's niece would like to discuss further amongst themselves, they'd also like to explore if another health care system would be able to operate on her or not. Reviewed with him about the patient's current condition and the appropriateness of considering a more palliative/comfort focused mode of care.   I have discussed with surgery PA. Also discussed with TRH MD. PMT to continue to follow.   NEXT OF KIN  nephew Veronia Beets [phone]  SUMMARY OF RECOMMENDATIONS    Remains full code, full scope for now. Ongoing discussions with the patient's nephew, primary decision maker regarding her overall goals of care. Discussed with nephew over the phone, about the patient's non resolving obstruction, her underlying malignancy, recent evidence of liver and skeletal mets. Hence, discussed with him about considering DNR DNI, a more comfort focused approach and hospice.  Nephew asks if there is another hospital or surgical center that can operate on the  patient.  Call placed and discussed with surgery as well as TRH MD. As per surgery, the patient is not a surgical candidate.  Will continue to discuss overall goals of care with nephew. PMT to follow.    Code Status/Advance Care Planning:  Full code    Symptom Management:    as above   Palliative Prophylaxis:   Delirium Protocol  Additional Recommendations (Limitations, Scope, Preferences):  Full Scope Treatment  Psycho-social/Spiritual:   Desire for further Chaplaincy support:yes  Additional Recommendations: Caregiving  Support/Resources  Prognosis:   Unable to determine  Discharge Planning: To Be Determined      Primary Diagnoses: Present on Admission: . SBO (small bowel  obstruction) (HCC) . Protein-calorie malnutrition, severe . 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 Oral thrush . Metastasis to liver (HCC) . Metastasis to bone (HCC) . Diabetes with neurologic complications (HCC) . Dementia (HCC) . Small bowel obstruction (HCC)   I have reviewed the medical record, interviewed the patient and family, and examined the patient. The following aspects are pertinent.  Past Medical History:  Diagnosis Date  . Abdominal mass 10/2017   SMALL BOWEL  . Diabetes mellitus    type 2  . Dyspnea   . Failure to thrive (0-17) 12/24/2017  . Glaucoma   . Hyperlipidemia   . Hypertension   . Neuropathy   . Osteoporosis   . Wheezing    Social History   Socioeconomic History  . Marital status: Widowed    Spouse name: Not on file  . Number of children: Not on file  . Years of education: Not on file  . Highest education level: Not on file  Occupational History  . Not on file  Social Needs  . Financial resource strain: Not on file  . Food insecurity:    Worry: Not on file    Inability: Not on file  . Transportation needs:    Medical: Not on file    Non-medical: Not on file  Tobacco Use  . Smoking status: Never Smoker  . Smokeless tobacco: Never Used  Substance and Sexual Activity  . Alcohol use: No  . Drug use: No  . Sexual activity: Not Currently  Lifestyle  . Physical activity:    Days per week: Not on file    Minutes per session: Not on file  . Stress: Not on file  Relationships  . Social connections:    Talks on phone: Not on file    Gets together: Not on file    Attends religious service: Not on file    Active member of club or organization: Not on file    Attends meetings of clubs or organizations: Not on file    Relationship status: Not on file  Other Topics Concern  . Not on file  Social History Narrative  . Not on file   Family History  Family history unknown: Yes   Scheduled Meds: . enoxaparin  (LOVENOX) injection  1 mg/kg Subcutaneous Q24H  . insulin aspart  0-9 Units Subcutaneous Q4H   Continuous Infusions: . acetaminophen 1,000 mg (12/24/18 1344)  . dextrose 100 mL/hr at 12/24/18 1608  . fluconazole (DIFLUCAN) IV 100 mg (12/24/18 0906)   PRN Meds:.acetaminophen, acetaminophen **OR** acetaminophen, morphine injection, ondansetron (ZOFRAN) IV Medications Prior to Admission:  Prior to Admission medications   Medication Sig Start Date End Date Taking? Authorizing Provider  ELIQUIS 2.5 MG TABS tablet TAKE 1 TABLET BY MOUTH TWICE A DAY 12/16/18  Yes Lynnea Ferrier  T, MD  fluconazole (DIFLUCAN) 100 MG tablet Take 1 tablet (100 mg total) by mouth daily. FOR 14 DAYS, CRUSH 12/16/18  Yes [place], Velna Hatchet, MD  insulin detemir (LEVEMIR) 100 UNIT/ML injection Inject 0.08 mLs (8 Units total) into the skin at bedtime. Patient taking differently: Inject 10 Units into the skin at bedtime.  12/12/18  Yes [place], Velna Hatchet, MD  Metoclopramide HCl 5 MG TBDP Take 1 tablet (5 mg total) by mouth 3 (three) times daily. 12/16/18  Yes [place], Velna Hatchet, MD  ondansetron (ZOFRAN ODT) 4 MG disintegrating tablet Take 1 tablet (4 mg total) by mouth every 8 (eight) hours as needed for nausea or vomiting. 12/16/18  Yes [place], Velna Hatchet, MD  pantoprazole (PROTONIX) 40 MG tablet TAKE 1 TABLET BY MOUTH EVERY DAY 12/16/18  Yes Donita Brooks, MD  promethazine (PHENERGAN) 12.5 MG suppository Place 1 suppository (12.5 mg total) rectally every 6 (six) hours as needed for nausea or vomiting. 12/16/18  Yes [place], Velna Hatchet, MD  Blood Glucose Monitoring Suppl (BLOOD GLUCOSE SYSTEM PAK) KIT Please dispense based on patient and insurance preference. Use as directed to monitor FSBS 1x daily. Dx: E11.9. 09/03/18   Danelle Berry, PA-C  Glucose Blood (BLOOD GLUCOSE TEST STRIPS) STRP Please dispense based on patient and insurance preference. Use as directed to monitor FSBS 1x daily. Dx: E11.9. 09/03/18   Danelle Berry, PA-C    Insulin Pen Needle (BD PEN NEEDLE MICRO U/F) 32G X 6 MM MISC Use with insulin pen ICD10:e11.43 09/16/18   [name], MD  Lancets MISC Please dispense based on patient and insurance preference. Use as directed to monitor FSBS 1x daily. Dx: E11.9. 09/03/18   Danelle Berry, PA-C   No Known Allergies Review of Systems Review of Systems +abdominal pain sometimes. Physical Exam   Weak elderly lady resting in bed She has a G tube, on suction Awake alert Is able to answer few questions.  S 1 S 2  Clear breath sounds anteriorly Appears frail    Vital Signs: BP 115/70 (BP Location: Right Arm)   Pulse 68   Temp 98 F (36.7 C) (Oral)   Resp 16   Ht 5\' 4"  (1.626 m)   Wt 38.1 kg   SpO2 99%   BMI 14.42 kg/m  Pain Scale: 0-10 POSS *See Group Information*: 1-Acceptable,Awake and alert Pain Score: 6    SpO2: SpO2: 99 % O2 Device:SpO2: 99 % O2 Flow Rate: .   IO: Intake/output summary:   Intake/Output Summary (Last 24 hours) at 12/24/2018 1653 Last data filed at 12/24/2018 1400 Gross per 24 hour  Intake 1163.22 ml  Output 100 ml  Net 1063.22 ml    LBM: Last BM Date: 12/22/18 Baseline Weight: Weight: 38.1 kg Most recent weight: Weight: 38.1 kg     Palliative Assessment/Data:   Flowsheet Rows     Most Recent Value  Intake Tab  Referral Department  Hospitalist  Unit at Time of Referral  Med/Surg Unit  Palliative Care Primary Diagnosis  Cancer  Date Notified  12/22/18  Palliative Care Type  Return patient Palliative Care  Reason for referral  Clarify Goals of Care  Date of Admission  12/21/18  # of days IP prior to Palliative referral  1  Clinical Assessment  Psychosocial & Spiritual Assessment  Palliative Care Outcomes    PPS 30%  Time In:  1500 Time Out:  1610 Time Total:  70 min  Greater than 50%  of this time was spent  counseling and coordinating care related to the above assessment and plan.  Signed by: Rosalin Hawking, MD [id number]  Please contact Palliative  Medicine Team phone at [phone] for questions and concerns.  For individual provider: See Loretha Stapler

## 2018-12-24 NOTE — Progress Notes (Signed)
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  CT scan show high-grade small bowel obstruction, large stool burden, possible right hydronephrosis.  He was also started on an insulin drip, discussed with general surgery and accepted to hospitalist service.    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  G tube to gravity.  N.p.o. Surgery following. Patient is not a surgical candidate.  KUB with persistent SBO.  Continue with IV fluids. Palliative  consult.  Hydronephrosis;  Renal function stable.  AKI: Improved with fluids.  DM; was initially on insulin drip.  He dropped to 60.  With put on low rate IV fluids with D5. Will hold levemir.     Nutrition; n.p.o. for now.  Hypernatremia; IV fluids  Thrush; continue with IV Diflucan Resume nystatin  History of DVT;  Holding eliquis.  On Lovenox.    Pressure Injury 12/13/17 Stage III -  Full thickness tissue loss. Subcutaneous fat may be visible but bone, tendon or muscle are NOT exposed. (Active)  12/13/17 1500  Location: Coccyx  Location Orientation:   Staging: Stage III -  Full thickness tissue loss. Subcutaneous fat may  be visible but bone, tendon or muscle are NOT exposed.  Wound Description (Comments):   Present on Admission: No     Pressure Injury 12/13/17 Stage II -  Partial thickness loss of dermis presenting as a shallow open ulcer with a red, pink wound bed without slough. (Active)  12/13/17 1500  Location: Buttocks  Location Orientation: Right  Staging: Stage II -  Partial thickness loss of dermis presenting as a shallow open ulcer with a red, pink wound bed without slough.  Wound Description (Comments):   Present on Admission: No     Estimated body mass index is 14.42 kg/m as calculated from the following:   Height as of this encounter: 5\' 4"  (1.626 m).   Weight as of this encounter: 38.1 kg.   DVT prophylaxis: Lovenox Code Status: Full code Family Communication: updated Nephew over phone, surgical team in here wouldn't do surgery on Mrs. Bedi, if she has surgery, obstruction could happen again, and patient has carcinomatosis that will cause SBO. If they wish they can get a second opinion at different facility.  Disposition Plan: remain in patient.   Consultants:  Surgery   Procedures:  No   Antimicrobials:  none   Subjective: Pleasantly confuse. Report abdominal discomfort.    Objective: Vitals:   12/23/18 0542 12/23/18 1349 12/23/18 2045 12/24/18 0517  BP: 129/85 130/78 (!) 147/91 (!) 147/85  Pulse: 76 74 73 68  Resp: 16 18 18 18   Temp:  98.6 F (37 C) 98.5 F (36.9 C) 97.9 F (36.6 C)  TempSrc:  Oral Oral  Oral  SpO2: 99% 100% 100% 98%  Weight:      Height:        Intake/Output Summary (Last 24 hours) at 12/24/2018 1145 Last data filed at 12/24/2018 1000 Gross per 24 hour  Intake 1163.22 ml  Output 100 ml  Net 1063.22 ml   Filed Weights   12/21/18 1836  Weight: 38.1 kg    Examination:  General exam: NAD, cachetic Respiratory system: CTA Cardiovascular system: S 1, S 2 RRR Gastrointestinal system: Abdomen soft, distended, G tube in place.  Central  nervous system: alert.  Extremities: No edema Skin: no rashes.   Data Reviewed: I have personally reviewed following labs and imaging studies  CBC: Recent Labs  Lab 12/21/18 1850 12/23/18 0436 12/24/18 0506  WBC 6.2 4.3 3.1*  HGB 15.3* 11.1* 12.3  HCT 48.5* 35.4* 39.1  MCV 90.8 93.4 94.4  PLT 358 237 235   Basic Metabolic Panel: Recent Labs  Lab 12/21/18 1850 12/22/18 0310 12/23/18 0436 12/24/18 0506  NA 137 143 148* 141  K 5.4* 3.8 3.4* 3.5  CL 107 112* 113* 103  CO2 17* 27 27 32  GLUCOSE 658* 190* 305* 128*  BUN 67* 54* 34* 20  CREATININE 1.38* 0.88 0.86 0.87  CALCIUM 9.8 8.3* 7.9* 8.9   GFR: Estimated Creatinine Clearance: 28.4 mL/min (by C-G formula based on SCr of 0.87 mg/dL). Liver Function Tests: Recent Labs  Lab 12/21/18 1850  AST 11*  ALT 9  ALKPHOS 43  BILITOT 0.4  PROT 4.1*  ALBUMIN 2.0*   Recent Labs  Lab 12/21/18 1850  LIPASE 32   No results for input(s): AMMONIA in the last 168 hours. Coagulation Profile: No results for input(s): INR, PROTIME in the last 168 hours. Cardiac Enzymes: No results for input(s): CKTOTAL, CKMB, CKMBINDEX, TROPONINI in the last 168 hours. BNP (last 3 results) No results for input(s): PROBNP in the last 8760 hours. HbA1C: No results for input(s): HGBA1C in the last 72 hours. CBG: Recent Labs  Lab 12/23/18 1839 12/23/18 1951 12/23/18 2326 12/24/18 0354 12/24/18 0813  GLUCAP 121* 106* 118* 149* 73   Lipid Profile: No results for input(s): CHOL, HDL, LDLCALC, TRIG, CHOLHDL, LDLDIRECT in the last 72 hours. Thyroid Function Tests: No results for input(s): TSH, T4TOTAL, FREET4, T3FREE, THYROIDAB in the last 72 hours. Anemia Panel: No results for input(s): VITAMINB12, FOLATE, FERRITIN, TIBC, IRON, RETICCTPCT in the last 72 hours. Sepsis Labs: Recent Labs  Lab 12/21/18 2143 12/21/18 2358 12/22/18 0254  LATICACIDVEN 3.02* 3.86* 2.11*    No results found for this or any previous visit (from the past 240  hour(s)).       Radiology Studies: Dg Abd 1 View  Result Date: 12/24/2018 CLINICAL DATA:  Follow-up small bowel obstruction EXAM: ABDOMEN - 1 VIEW COMPARISON:  12/23/2018 FINDINGS: Gastrostomy tube projects over the left upper quadrant. Dilated bowel loops in the abdomen and pelvis appear to reflect dilated small bowel compatible with small bowel obstruction. No organomegaly or free air. IMPRESSION: Continued small bowel obstruction pattern, not significantly changed. Electronically Signed   By: Charlett Nose M.D.   On: 12/24/2018 08:57   Dg Abd 1 View  Result Date: 12/23/2018 CLINICAL DATA:  Abdominal pain EXAM: ABDOMEN - 1 VIEW COMPARISON:  12/21/2018 FINDINGS: Scattered large and small bowel gas is noted. Mild small bowel prominence is noted similar to that seen on the prior exam. Pessary is noted in place. Degenerative changes of lumbar spine are noted. Postsurgical changes as well as  a gastrostomy are seen. IMPRESSION: Mild small bowel dilatation is noted. No free air is seen. Continued follow-up is recommended. Electronically Signed   By: Alcide Clever M.D.   On: 12/23/2018 09:50        Scheduled Meds: . enoxaparin (LOVENOX) injection  1 mg/kg Subcutaneous Q24H  . insulin aspart  0-9 Units Subcutaneous Q4H  . insulin detemir  10 Units Subcutaneous QHS   Continuous Infusions: . dextrose 100 mL/hr at 12/24/18 0500  . fluconazole (DIFLUCAN) IV 100 mg (12/24/18 0906)     LOS: 3 days    Time spent: 35 minutes.     Alba Cory, MD [facility] Pager [phone]  If 7PM-7AM, please contact night-coverage [email or web address] Password [email or web address] 12/24/2018, 11:45 AM

## 2018-12-24 NOTE — Progress Notes (Signed)
Patient ID: KAYLANN BEHNER, female   DOB: 29-Mar-1931, 85 y.o.   MRN: [id number]        Subjective: Patient with no complaints this morning. Pleasant. She is unsure if she is passing flatus. No BM. No g-tube output recorded or output in cannister.   Objective: Vital signs in last 24 hours: Temp:  [97.9 F (36.6 C)-98.6 F (37 C)] 97.9 F (36.6 C) (01/07 0517) Pulse Rate:  [68-74] 68 (01/07 0517) Resp:  [18] 18 (01/07 0517) BP: (130-147)/(78-91) 147/85 (01/07 0517) SpO2:  [98 %-100 %] 98 % (01/07 0517) Last BM Date: 12/22/18  Intake/Output from previous day: 01/06 0701 - 01/07 0700 In: 1163.2 [P.O.:60; I.V.:1103.2] Out: 0  Intake/Output this shift: No intake/output data recorded.  PE: Abd: Soft, ND, NT. + BS. G-tube to suction without output.   Lab Results:  Recent Labs    12/23/18 0436 12/24/18 0506  WBC 4.3 3.1*  HGB 11.1* 12.3  HCT 35.4* 39.1  PLT 237 235   BMET Recent Labs    12/23/18 0436 12/24/18 0506  NA 148* 141  K 3.4* 3.5  CL 113* 103  CO2 27 32  GLUCOSE 305* 128*  BUN 34* 20  CREATININE 0.86 0.87  CALCIUM 7.9* 8.9   PT/INR No results for input(s): LABPROT, INR in the last 72 hours. CMP     Component Value Date/Time   NA 141 12/24/2018 0506   K 3.5 12/24/2018 0506   CL 103 12/24/2018 0506   CO2 32 12/24/2018 0506   GLUCOSE 128 (H) 12/24/2018 0506   BUN 20 12/24/2018 0506   CREATININE 0.87 12/24/2018 0506   CREATININE 0.92 10/18/2018 1321   CREATININE 0.88 09/03/2018 1519   CALCIUM 8.9 12/24/2018 0506   PROT 4.1 (L) 12/21/2018 1850   ALBUMIN 2.0 (L) 12/21/2018 1850   AST 11 (L) 12/21/2018 1850   AST 22 10/18/2018 1321   ALT 9 12/21/2018 1850   ALT 26 10/18/2018 1321   ALKPHOS 43 12/21/2018 1850   BILITOT 0.4 12/21/2018 1850   BILITOT 0.7 10/18/2018 1321   GFRNONAA >60 12/24/2018 0506   GFRNONAA 55 (L) 10/18/2018 1321   GFRNONAA 60 09/03/2018 1519   GFRAA >60 12/24/2018 0506   GFRAA >60 10/18/2018 1321   GFRAA 69 09/03/2018 1519    Lipase     Component Value Date/Time   LIPASE 32 12/21/2018 1850       Studies/Results: Dg Abd 1 View  Result Date: 12/23/2018 CLINICAL DATA:  Abdominal pain EXAM: ABDOMEN - 1 VIEW COMPARISON:  12/21/2018 FINDINGS: Scattered large and small bowel gas is noted. Mild small bowel prominence is noted similar to that seen on the prior exam. Pessary is noted in place. Degenerative changes of lumbar spine are noted. Postsurgical changes as well as a gastrostomy are seen. IMPRESSION: Mild small bowel dilatation is noted. No free air is seen. Continued follow-up is recommended. Electronically Signed   By: Alcide Clever M.D.   On: 12/23/2018 09:50    Anti-infectives: Anti-infectives (From admission, onward)   Start     Dose/Rate Route Frequency Ordered Stop   12/22/18 1000  fluconazole (DIFLUCAN) IVPB 100 mg     100 mg 50 mL/hr over 60 Minutes Intravenous Every 24 hours 12/22/18 0859         Assessment/Plan H/O neuroendocrine tumor, s/p resection x2 by Dr. Donell Beers Dementia DM History of DVT AKI  SBO likely secondary to carcinomatosis, recurrently -G-tube currently on suction. No GT output documented or in cannister -  Xray with continued SBO pattern. Patient is not a surgical candidate.  -Continue G-tube to gravity.  -Pending palliative consult.   FEN - NPO with g-tube to suction for now VTE - Lovenox ID - diflucan   LOS: 3 days    Jacinto Halim , [facility] 12/24/2018, 8:51 AM Pager: [phone]

## 2018-12-24 NOTE — Progress Notes (Signed)
Pts family has inquired about moving the pt to [place] as they are unhappy with her status as "nonsurgical". I have informed MD and will f/u with CM as requested by the family.

## 2018-12-24 NOTE — Care Management Note (Signed)
Case Management Note  Patient Details  Name: CADENCE MINTON MRN: [id number] Date of Birth: 1931/05/21  Subjective/Objective: SBO. From home. Full code.Active [facility]-rep Santiago Glad aware.Has GT-suction,noted no GT output. For abd xray f/u.NPO. Noted palliative cons-await recc.                   Action/Plan:dc plan home.   Expected Discharge Date:                  Expected Discharge Plan:  [place]  In-House Referral:     Discharge planning Services  CM Consult  Post Acute Care Choice:  Home Health(Active w/[facility]-HHRN,aide.) Choice offered to:     DME Arranged:    DME Agency:     HH Arranged:    [place] Agency:     Status of Service:  In process, will continue to follow  If discussed at Long Length of Stay Meetings, dates discussed:    Additional Comments:  Dessa Phi, RN 12/24/2018, 12:29 PM

## 2018-12-24 NOTE — Care Management Important Message (Signed)
Important Message  Patient Details  Name: Theresa Howe MRN: [id number] Date of Birth: 05-09-1931   Medicare Important Message Given:  Yes    Kerin Salen 12/24/2018, 12:05 [place] Message  Patient Details  Name: Theresa Howe MRN: [id number] Date of Birth: 08-10-1931   Medicare Important Message Given:  Yes    Kerin Salen 12/24/2018, 12:05 PM

## 2018-12-25 DIAGNOSIS — K56609 Unspecified intestinal obstruction, unspecified as to partial versus complete obstruction: Secondary | ICD-10-CM

## 2018-12-25 DIAGNOSIS — Z7189 Other specified counseling: Secondary | ICD-10-CM

## 2018-12-25 DIAGNOSIS — N179 Acute kidney failure, unspecified: Secondary | ICD-10-CM

## 2018-12-25 DIAGNOSIS — C7A8 Other malignant neuroendocrine tumors: Principal | ICD-10-CM

## 2018-12-25 DIAGNOSIS — F0150 Vascular dementia without behavioral disturbance: Secondary | ICD-10-CM

## 2018-12-25 DIAGNOSIS — E119 Type 2 diabetes mellitus without complications: Secondary | ICD-10-CM

## 2018-12-25 LAB — GLUCOSE, CAPILLARY
Glucose-Capillary: 152 mg/dL — ABNORMAL HIGH (ref 70–99)
Glucose-Capillary: 181 mg/dL — ABNORMAL HIGH (ref 70–99)
Glucose-Capillary: 92 mg/dL (ref 70–99)
Glucose-Capillary: 206 mg/dL — ABNORMAL HIGH (ref 70–99)
Glucose-Capillary: 80 mg/dL (ref 70–99)

## 2018-12-25 MED ORDER — NYSTATIN 100000 UNIT/ML MT SUSP
5.0000 mL | Freq: Four times a day (QID) | OROMUCOSAL | Status: DC
  Administered 2018-12-25 – 2018-12-26 (×3): 500000 [IU] via ORAL
  Filled 2018-12-25 (×3): qty 5

## 2018-12-25 NOTE — Progress Notes (Signed)
   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  I reviewed operative records from Dr. Byerly.  Patient had laparoscopic procedure initially with small bowel resection.  She was found at that time to have hepatic metastasis, omental disease, and mesenteric disease.  She required small bowel resection.  In January 2019 the patient had recurrent small bowel obstruction.  An attempt at laparoscopic surgery was not possible due to extensive adhesions.  Procedure was converted to open surgery which was quite difficult due to extensive adhesions and metastatic disease.  Patient was found to have disease involving the liver in the mesentery as well as peritoneal implants.  Significant damage was done to multiple areas of the small bowel requiring 2 small bowel resections.  A gastrostomy tube was placed in anticipation that the patient would not have further surgical intervention.  Recent studies include a nuclear medicine PET scan demonstrating disease in the liver, mesentery, and skeleton.  Patient also has a CT scan demonstrating obstruction likely due to recurrent tumor.  There are multiple metastasis in the liver.  There is involvement of the mesentery.  I met today with the patient's niece.  The patient does not have any children.  I explained to her that further surgical intervention would likely cause for more harm than good.  I think it is nearly impossible to believe that her gastrointestinal tract will ever be normally functional again.  I think there is significant risk of further damage to small bowel requiring resection with the likely outcome of multiple fistulae and possibly an ostomy, and most likely an open abdominal wound.  Given the patient's debilitated state and poor  nutritional status, I do not think she would be able to recover from any major surgical intervention.  I explained to the niece that there were simply prohibitive risk to any surgical intervention and that we would not recommend any surgery for this unfortunate patient.  She does have a gastrostomy tube in place through which they may provide a small amount of nutrition and hydration.  It may also be used for decompression for the small bowel obstruction.  After our discussion, the niece stated that they would be interested in having palliative care provide services to them at home.  They apparently have another family member at home who is receiving care from palliative services.  I have encouraged her to take this up with the medical service today to see if arrangements can be made for this patient to go home with the assistance of palliative care.  General surgery will sign off of this case at this time.  I will make Dr. Byerly and Dr. Tsuei aware of my discussions this morning.  Please call if we may be of assistance in any other way.         , MD       [facility]       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  Family at bedside.  Objective: Vital signs in last 24 hours: Temp:  [97.7 F (36.5 C)-98 F (36.7 C)] 97.7 F (36.5 C) (01/08 0601) Pulse Rate:  [66-68] 66 (01/08 0601) Resp:  [16-18] 16 (01/08 0601) BP: (  115-118)/(70-72) 115/72 (01/08 0601) SpO2:  [98 %-100 %] 98 % (01/08 0601) Last BM Date: 12/24/18(passed 2 small hard pieces of stool with soap suds enema)  Intake/Output from previous day: 01/07 0701 - 01/08 0700 In: 2124.1 [P.O.:120; I.V.:1854.1; IV Piggyback:150] Out: 800 [Urine:800] Intake/Output this shift: No intake/output data recorded.  Physical Exam: HEENT - sclerae clear, mucous membranes moist Neck -  soft Chest - clear bilaterally Cor - RRR Abdomen - soft without distension; non-tender; G-tube site clear and dry with dressing   Lab Results:  Recent Labs    12/23/18 0436 12/24/18 0506  WBC 4.3 3.1*  HGB 11.1* 12.3  HCT 35.4* 39.1  PLT 237 235   BMET Recent Labs    12/23/18 0436 12/24/18 0506  NA 148* 141  K 3.4* 3.5  CL 113* 103  CO2 27 32  GLUCOSE 305* 128*  BUN 34* 20  CREATININE 0.86 0.87  CALCIUM 7.9* 8.9   PT/INR No results for input(s): LABPROT, INR in the last 72 hours. Comprehensive Metabolic Panel:    Component Value Date/Time   NA 141 12/24/2018 0506   NA 148 (H) 12/23/2018 0436   K 3.5 12/24/2018 0506   K 3.4 (L) 12/23/2018 0436   CL 103 12/24/2018 0506   CL 113 (H) 12/23/2018 0436   CO2 32 12/24/2018 0506   CO2 27 12/23/2018 0436   BUN 20 12/24/2018 0506   BUN 34 (H) 12/23/2018 0436   CREATININE 0.87 12/24/2018 0506   CREATININE 0.86 12/23/2018 0436   CREATININE 0.92 10/18/2018 1321   CREATININE 0.88 09/03/2018 1519   CREATININE 0.97 (H) 10/05/2017 1400   GLUCOSE 128 (H) 12/24/2018 0506   GLUCOSE 305 (H) 12/23/2018 0436   CALCIUM 8.9 12/24/2018 0506   CALCIUM 7.9 (L) 12/23/2018 0436   AST 11 (L) 12/21/2018 1850   AST 21 12/16/2018 1538   AST 22 10/18/2018 1321   ALT 9 12/21/2018 1850   ALT 12 12/16/2018 1538   ALT 26 10/18/2018 1321   ALKPHOS 43 12/21/2018 1850   ALKPHOS 75 12/16/2018 1538   BILITOT 0.4 12/21/2018 1850   BILITOT 0.6 12/16/2018 1538   BILITOT 0.7 10/18/2018 1321   PROT 4.1 (L) 12/21/2018 1850   PROT 8.1 12/16/2018 1538   ALBUMIN 2.0 (L) 12/21/2018 1850   ALBUMIN 3.8 12/16/2018 1538    Studies/Results: Dg Abd 1 View  Result Date: 12/24/2018 CLINICAL DATA:  Follow-up small bowel obstruction EXAM: ABDOMEN - 1 VIEW COMPARISON:  12/23/2018 FINDINGS: Gastrostomy tube projects over the left upper quadrant. Dilated bowel loops in the abdomen and pelvis appear to reflect dilated small bowel compatible with small bowel  obstruction. No organomegaly or free air. IMPRESSION: Continued small bowel obstruction pattern, not significantly changed. Electronically Signed   By: Kevin  Dover M.D.   On: 12/24/2018 08:57   Dg Abd 1 View  Result Date: 12/23/2018 CLINICAL DATA:  Abdominal pain EXAM: ABDOMEN - 1 VIEW COMPARISON:  12/21/2018 FINDINGS: Scattered large and small bowel gas is noted. Mild small bowel prominence is noted similar to that seen on the prior exam. Pessary is noted in place. Degenerative changes of lumbar spine are noted. Postsurgical changes as well as a gastrostomy are seen. IMPRESSION: Mild small bowel dilatation is noted. No free air is seen. Continued follow-up is recommended. Electronically Signed   By: Mark  Lukens M.D.   On: 12/23/2018 09:50      , M 12/25/2018   

## 2018-12-25 NOTE — Plan of Care (Signed)
  Problem: Health Behavior/Discharge Planning: Goal: Ability to manage health-related needs will improve Outcome: Progressing   

## 2018-12-25 NOTE — Consult Note (Signed)
[place] CONSULT NOTE  Patient Care Team: [place], Modena Nunnery, MD as PCP - General (Family Medicine)  HEME/ONC OVERVIEW: 1. 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 Barry Dienes; path showed low-grade neuroendocrine tumor of the small bowel involving LN's and omentum (pT4N2), focal positive margin -12/2017: repeat diagnostic laparoscopy/laparotomy with small bowel resection; path showed low grade neuroendocrine tumor, margin not commented  -09/2018: admitted for small bowel obstruction but no mass identified; resolved with NG tube  -10/2018: dotatate PET showed multiple liver mets, mesenteric thickening (possible disease) and occult skeletal mets within thoracic/lumbar spine, ribs and sacrum   2. Hx of DVT on Eliquis ppx   3. G-tube with continuous tube feed  ACTIVE TREATMENTS: 1. Q4week lanreotide, 11/15/2018 - present  ASSESSMENT & PLAN:   High-grade small bowel obstruction -Demonstrated on CT on admission -GI surgery was consulted, and recommended against any surgical intervention due to the patient debilitated state and poor nutrition -I suspect that obstruction was likely multifactorial, including adhesions and metastatic neuroendocrine tumor -Given the relative indolent nature of the neuroendocrine tumor, I explained to the patient's family over the phone that even with Lutathera treatment, an immediate improvement in the small bowel obstruction would not be expected -In the absence of any feasible intervention, I agree with the recommendation from surgery team to consider hospice -The patient's family asked whether I thought physicians at [facility] would have different recommendations; I explained to the patient's family that I would not be able to speculate on what physicians at [facility] would offered, but given the patient's age,  overall frailty, and dementia, she is unlikely to be a candidate for any aggressive surgical intervention  Metastatic neuroendocrine tumor of the small bowel with mets to the liver and bones -Patient was started on lanreotide injection in late 10/2018 -See goals of care discussion below -As the patient's family is planning to transition toward home hospice due to the high-grade small bowel obstruction, I agree with discontinuing future treatment, as lanreotide injection would unlikely change the overall outcome  Goals of care discussion -I discussed the CT results in detail with the patient's family over the phone, including the likely multiple factors that are causing the patient's high-grade small bowel obstruction -In light of the lack of feasible interventions for the small bowel obstruction, I agree with the recommendation from surgery and the palliative care team's regarding hospice  Thank you for the opportunity to participate in Theresa Howe's care.  Please not hesitate to contact me if there are any questions.  Tish Men, MD 12/25/2018 4:02 PM   CHIEF COMPLAINTS/PURPOSE OF CONSULTATION:  "I am doing okay"  HISTORY OF PRESENTING ILLNESS:  Theresa Howe 85 y.o. female is admitted to the hospitalist service because of persistent nausea and vomiting for 2 days.  Patient has baseline dementia, so the history obtained primarily from the patient's niece via the phone.  The patient was accompanied by her sitter, but no family member was present at bedside..  Per the family, she has had progressive nausea and vomiting for the past few days, and she had come to the ER on 12/16/2018 for the symptoms, for which she was given fluids and Zofran and discharged home.  However, her symptoms continue to persist, prompting family to bring her to ER for further evaluation.  In the ED, CT abdomen/pelvis showed high-grade small bowel obstruction.  Labs  were notable for elevated creatinine (1.38), mild  hyperkalemia, and lactic acidosis.  Patient was admitted to the hospitalist service for further management.  Since admission, GI surgery has been consulted, and recommended against any surgical intervention due to patient's debilitated state and poor nutritional status.  They recommended using the G-tube for decompression of the small bowel obstruction.  Palliative care was consulted, and the recommended hospice.  The plan is to discharge patient home with home hospice on 12/26/2018.  Today, patient was sitting up in a chair comfortably, and denied any complaint except mild abdominal discomfort.  Patient was accompanied by a sitter.  I have reviewed her chart and materials related to her cancer extensively and collaborated history with the patient. Summary of oncologic history is as follows:   Primary malignant neuroendocrine tumor of small intestine ([place])   10/03/2017 Imaging    MRI abdomen: 1. Nondiagnostic study due to motion artifact. 2. Diffuse distention of main pancreatic duct in the head and body of pancreas as seen on CT. 3. 11 mm enhancing lesion in the inferior right liver cannot be further characterized. Follow-up imaging may prove helpful to ensure stability. If the patient is able to receive iodinated intravenous contrast, CT may be a better means of follow-up due to the better temporal resolution of CT imaging.    10/17/2017 Imaging    CT pancreas protocol: IMPRESSION: 1. Subcapsular enhancing lesion in the RIGHT hepatic lobe is favored a benign vascular phenomena. Recommend attention on follow-up. 2. Dilatation of the pancreatic duct without obstructing mass identified. Cannot exclude a ampullary lesion. Consider ERCP for evaluation. 3. The small bowel and pelvic mesenteric mass are not imaged on CT abdomen. In patient with potential neuroendocrine tumor, consider a Ga 66 DOTATATE PET/CT scan which has high sensitivity and specificity for well differentiated neuroendocrine tumors.  This could also evaluate the potential ampullary lesion if of neuroendocrine origin.    12/04/2017 Surgery    Diagnostic laparoscopy with small bowel resection, partial omentectomy by Dr. Barry Dienes    12/04/2017 Pathology Results    (Accession: [id number]) 1. Small intestine, resection for tumor - LOW GRADE NEUROENDOCRINE TUMOR, 3.8 CM, EXTENDING TO SMALL BOWEL SEROSA. - PROXIMAL AND DISTAL MARGINS FREE OF TUMOR. - FOUR CM MESENTERIC NODULE CONSISTENT WITH LYMPH NODE METASTASIS. - METASTATIC LOW GRADE NEUROENDOCRINE TUMOR IN THREE OF ELEVEN LYMPH NODES (3/11). 2. Omentum, biopsy - NODULES OF LOW GRADE NEUROENDOCRINE TUMOR INVOLVING OMENTAL ADIPOSE TISSUE.  pT4pN2    12/04/2017 Cancer Staging    Staging form: Small Intestine - Other Histologies, AJCC 8th Edition - Clinical stage from 12/04/2017: cT4, cN2, cM1 - Signed by Tish Men, MD on 10/14/2018    12/27/2017 Surgery    REPEAT DIAGNOSTIC LAPAROSCOPY/ LAPAROTOMY WITH SMALL BOWEL RESECTION (due to positive margin)    12/27/2017 Pathology Results    (Accession: [id number]) Small intestine, resection - LOW GRADE NEUROENDOCRINE TUMOR (CARCINOID TUMOR) INVOLVING SMOOTH MUSCLE AND FIBROADIPOSE TISSUE. - BENIGN SMALL BOWEL TYPE MUCOSA.    01/16/2018 Imaging    CT abdomen/pelvis  IMPRESSION: Further continued decrease in right pelvic abscess. This appears completely drained presently, with only a small amount of density surrounding the drainage catheter.  New demonstration of 2 small hyperdense areas in the right lower quadrant adjacent to the anastomosis/drainage catheter, the larger  measuring 2 cm in diameter. The differential diagnosis is postprocedural hematoma versus rapid recurrence of tumor versus is small leak collections. These warrant additional follow-up.    09/25/2018 Imaging    CT abdomen/pelvis  w/ contrast: 1. Small bowel obstruction with transition zone in the right mid abdomen. No obstructing mass is identified. Small bowel  fold thickening suggesting associated enteritis. 2. Small amount of free fluid in the abdomen and pelvis, likely ascites. No free air. 3. Vaginal pessary.    10/05/2018 Imaging    CT abdomen/pelvis w/ contrast: IMPRESSION: 1. Small bowel obstruction with transition point in the left lower quadrant. Minimal contrast is seen into the colon. 2. Moderate stool in the distal sigmoid colon and rectum without distal obstruction. 3. A small amount of free fluid is likely reactive. 4. Stable intra and extrahepatic biliary dilation. 5.  Aortic Atherosclerosis (ICD10-I70.0).    11/08/2018 Imaging    Dotatate PET: 1. Multiple foci of intensely hypermetabolic LIVER METASTASIS ( 8 lesions). 2. Moderate linear uptake within the mesentery RIGHT lower quadrant associated with mesenteric thickening. Differential includes postsurgical inflammation versus metastatic disease to the mesentery. No clear bowel lesion identified. 3. SKELETAL METASTASIS within the thoracic and lumbar spine as well as ribs and sacrum. These lesions are occult on CT. 4. Dilatation of the proximal small bowel follow multiple surgeries. Differential include partial obstruction versus ileus.     MEDICAL HISTORY:  Past Medical History:  Diagnosis Date  . Abdominal mass 10/2017   SMALL BOWEL  . Diabetes mellitus    type 2  . Dyspnea   . Failure to thrive (0-17) 12/24/2017  . Glaucoma   . Hyperlipidemia   . Hypertension   . Neuropathy   . Osteoporosis   . Wheezing     SURGICAL HISTORY: Past Surgical History:  Procedure Laterality Date  . ABDOMINAL HYSTERECTOMY    . CATARACT EXTRACTION Right 01/2010  . COLON SURGERY    . COLONOSCOPY    . EUS N/A 04/17/2013   Procedure: UPPER ENDOSCOPIC ULTRASOUND (EUS) LINEAR;  Surgeon: Milus Banister, MD;  Location: [facility];  Service: Endoscopy;  Laterality: N/A;  . GLAUCOMA SURGERY  2007  . IR CM INJ ANY COLONIC TUBE W/FLUORO  09/12/2018  . IR FLUORO GUIDE CV LINE RIGHT   01/17/2018  . IR GASTROSTOMY TUBE MOD SED  04/11/2018  . IR IMAGE GUIDED DRAINAGE PERCUT CATH  PERITONEAL RETROPERIT  01/09/2018  . IR REMOVAL TUN CV CATH W/O FL  06/19/2018  . IR SINUS/FIST TUBE CHK-NON GI  01/18/2018  . IR US GUIDE VASC ACCESS RIGHT  01/17/2018  . LAPAROSCOPIC SMALL BOWEL RESECTION  12/04/2017  . LAPAROSCOPIC SMALL BOWEL RESECTION N/A 12/04/2017   Procedure: LAPAROSCOPIC SMALL BOWEL RESECTION;  Surgeon: Stark Klein, MD;  Location: [facility];  Service: General;  Laterality: N/A;  . LAPAROSCOPY N/A 12/04/2017   Procedure: LAPAROSCOPY DIAGNOSTIC;  Surgeon: Stark Klein, MD;  Location: [place];  Service: General;  Laterality: N/A;  . LAPAROSCOPY N/A 12/27/2017   Procedure: DIAGNOSTIC LAPAROSCOPY/ LAPAROTOMY WITH SMALL BOWEL RESECTION TIMES TWO;  Surgeon: Stark Klein, MD;  Location: [place];  Service: General;  Laterality: N/A;  . MULTIPLE TOOTH EXTRACTIONS    . PICC LINE INSERTION  12/24/2017    SOCIAL HISTORY: Social History   Socioeconomic History  . Marital status: Widowed    Spouse name: Not on file  . Number of children: Not on file  . Years of education: Not on file  . Highest education level: Not on file  Occupational History  . Not on file  Social Needs  . Financial resource strain: Not on file  . Food insecurity:    Worry: Not on file  Inability: Not on file  . Transportation needs:    Medical: Not on file    Non-medical: Not on file  Tobacco Use  . Smoking status: Never Smoker  . Smokeless tobacco: Never Used  Substance and Sexual Activity  . Alcohol use: No  . Drug use: No  . Sexual activity: Not Currently  Lifestyle  . Physical activity:    Days per week: Not on file    Minutes per session: Not on file  . Stress: Not on file  Relationships  . Social connections:    Talks on phone: Not on file    Gets together: Not on file    Attends religious service: Not on file    Active member of club or organization: Not on file    Attends meetings of clubs  or organizations: Not on file    Relationship status: Not on file  . Intimate partner violence:    Fear of current or ex partner: Not on file    Emotionally abused: Not on file    Physically abused: Not on file    Forced sexual activity: Not on file  Other Topics Concern  . Not on file  Social History Narrative  . Not on file    FAMILY HISTORY: Family History  Family history unknown: Yes    ALLERGIES:  has No Known Allergies.  MEDICATIONS:  Current Facility-Administered Medications  Medication Dose Route Frequency Provider Last Rate Last Dose  . acetaminophen (TYLENOL) tablet 650 mg  650 mg Oral Q6H PRN Danford, Suann Larry, MD       Or  . acetaminophen (TYLENOL) suppository 650 mg  650 mg Rectal Q6H PRN Danford, Suann Larry, MD      . enoxaparin (LOVENOX) injection 40 mg  1 mg/kg Subcutaneous Q24H Danford, Suann Larry, MD   40 mg at 12/25/18 0844  . fluconazole (DIFLUCAN) IVPB 100 mg  100 mg Intravenous Q24H Edwin Dada, MD 50 mL/hr at 12/25/18 1014 100 mg at 12/25/18 1014  . insulin aspart (novoLOG) injection 0-9 Units  0-9 Units Subcutaneous Q4H Edwin Dada, MD   2 Units at 12/25/18 [phone]  . morphine 2 MG/ML injection 1 mg  1 mg Intravenous Q2H PRN Regalado, Belkys A, MD   1 mg at 12/24/18 1615  . nystatin (MYCOSTATIN) 100000 UNIT/ML suspension 500,000 Units  5 mL Oral QID Florencia Reasons, MD      . ondansetron [facility] injection 4 mg  4 mg Intravenous Q6H PRN Danford, Suann Larry, MD        REVIEW OF SYSTEMS:   Constitutional: ( - ) fevers, ( - )  chills , ( - ) night sweats Eyes: ( - ) blurriness of vision, ( - ) double vision, ( - ) watery eyes Ears, nose, mouth, throat, and face: ( - ) mucositis, ( - ) sore throat Respiratory: ( - ) cough, ( - ) dyspnea, ( - ) wheezes Cardiovascular: ( - ) palpitation, ( - ) chest discomfort, ( - ) lower extremity swelling Skin: ( - ) abnormal skin rashes Lymphatics: ( - ) new lymphadenopathy, ( - ) easy  bruising Neurological: ( - ) numbness, ( - ) tingling, ( - ) new weaknesses Behavioral/Psych: ( - ) mood change, ( - ) new changes  All other systems were reviewed with the patient and are negative.  PHYSICAL EXAMINATION: ECOG PERFORMANCE STATUS: 3 - Symptomatic, >50% confined to bed  Vitals:   12/25/18 0601 12/25/18 1335  BP: 115/72  93/61  Pulse: 66 75  Resp: 16 16  Temp: 97.7 F (36.5 C) 97.7 F (36.5 C)  SpO2: 98% 100%   Filed Weights   12/21/18 1836  Weight: 83 lb 15.9 oz (38.1 kg)    GENERAL: alert, no distress and comfortable, thin/frail SKIN: skin color, texture, turgor are normal, no rashes or significant lesions EYES: conjunctiva are pink and non-injected, sclera clear OROPHARYNX: no exudate, no erythema; lips, buccal mucosa, and tongue normal  NECK: supple, non-tender LUNGS: clear to auscultation and percussion with normal breathing effort HEART: regular rate & rhythm, no murmurs, no lower extremity edema ABDOMEN: soft, non-tender, non-distended, normal bowel sounds Musculoskeletal: no cyanosis of digits and no clubbing  PSYCH: alert, fluent speech  LABORATORY DATA:  I have reviewed the data as listed Lab Results  Component Value Date   WBC 3.1 (L) 12/24/2018   HGB 12.3 12/24/2018   HCT 39.1 12/24/2018   MCV 94.4 12/24/2018   PLT 235 12/24/2018   Lab Results  Component Value Date   NA 141 12/24/2018   K 3.5 12/24/2018   CL 103 12/24/2018   CO2 32 12/24/2018    RADIOGRAPHIC STUDIES: I have personally reviewed the radiological images as listed and agreed with the findings in the report. Ct Abdomen Pelvis Wo Contrast  Result Date: 12/21/2018 CLINICAL DATA:  Acute onset of hyperglycemia. Loose stools. Nausea and vomiting. Increased sleepiness. EXAM: CT ABDOMEN AND PELVIS WITHOUT CONTRAST TECHNIQUE: Multidetector CT imaging of the abdomen and pelvis was performed following the standard protocol without IV contrast. COMPARISON:  PET/CT performed 11/08/2018  FINDINGS: Lower chest: Mild right basilar atelectasis is noted. The visualized portions of the mediastinum are unremarkable. Hepatobiliary: Known hepatic metastases are again noted, though less well characterized on this study. The gallbladder is unremarkable in appearance. The common bile duct remains normal in caliber. Pancreas: The pancreas is within normal limits. Spleen: The spleen is unremarkable in appearance. Adrenals/Urinary Tract: The adrenal glands are unremarkable in appearance. Mild right-sided hydronephrosis is noted, with mild prominence of the right ureter. This could reflect distal obstruction, difficult to fully characterize. A nonobstructing 3 mm stone is noted at the lower pole of the right kidney. The left kidney is unremarkable in appearance. No perinephric stranding is seen. Stomach/Bowel: There is diffuse dilatation of small-bowel loops to 6.0 cm in maximal diameter, with a focal transition point at the upper pelvis, which may reflect adhesions and associated metastatic disease given underlying mesenteric thickening, on correlation with prior PET/CT. This is concerning for relatively high-grade small bowel obstruction, worsened from the prior study. Distal small bowel loops are decompressed, with surrounding postoperative change. The colon is largely decompressed, aside from a very large amount of stool at the rectum, which measures 9.8 cm in transverse dimension, concerning for fecal impaction. A G-tube is noted ending at the body of the stomach. The stomach is largely decompressed and grossly unremarkable. Vascular/Lymphatic: Scattered calcification is seen along the abdominal aorta and its branches. The abdominal aorta is otherwise grossly unremarkable. The inferior vena cava is grossly unremarkable. No retroperitoneal lymphadenopathy is seen. No pelvic sidewall lymphadenopathy is identified. Reproductive: The bladder is mildly distended and grossly unremarkable. A ring pessary is noted at  the pelvis. The patient is status post hysterectomy. No suspicious adnexal masses are seen. Other: No additional soft tissue abnormalities are seen. Musculoskeletal: No acute osseous abnormalities are identified. Known metastatic lesions within the lower thoracic and lumbar spine, and within the sacrum, are occult on CT. Multilevel vacuum  phenomenon is noted along the lumbar spine, with endplate degenerative change and underlying facet disease. The visualized musculature is unremarkable in appearance. IMPRESSION: 1. Diffuse dilatation of small-bowel loops to 6.0 cm in maximal diameter, with a focal transition point at the upper pelvis, which may reflect adhesions and associated metastatic disease given underlying mesenteric thickening, on correlation with prior PET/CT. This is concerning for high-grade small bowel obstruction, worsened from the prior study. 2. Very large amount of stool noted at the rectum, which measures 9.8 cm in transverse dimension, concerning for fecal impaction. 3. Mild right-sided hydronephrosis, with mild prominence of the right ureter. This could reflect distal obstruction, difficult to fully characterize. 4. Known hepatic metastases again noted, though less well characterized on this study. 5. Known metastatic lesions within the lower thoracic and lumbar spine, and within the sacrum, are occult on CT. 6. Nonobstructing 3 mm stone at the lower pole of the right kidney. Aortic Atherosclerosis (ICD10-I70.0). These results were called by telephone at the time of interpretation on 12/21/2018 at 11:00 pm to Dr. Charlesetta Shanks, who verbally acknowledged these results. Electronically Signed   By: Garald Balding M.D.   On: 12/21/2018 23:06   Dg Abd 1 View  Result Date: 12/24/2018 CLINICAL DATA:  Follow-up small bowel obstruction EXAM: ABDOMEN - 1 VIEW COMPARISON:  12/23/2018 FINDINGS: Gastrostomy tube projects over the left upper quadrant. Dilated bowel loops in the abdomen and pelvis appear to  reflect dilated small bowel compatible with small bowel obstruction. No organomegaly or free air. IMPRESSION: Continued small bowel obstruction pattern, not significantly changed. Electronically Signed   By: Rolm Baptise M.D.   On: 12/24/2018 08:57   Dg Abd 1 View  Result Date: 12/23/2018 CLINICAL DATA:  Abdominal pain EXAM: ABDOMEN - 1 VIEW COMPARISON:  12/21/2018 FINDINGS: Scattered large and small bowel gas is noted. Mild small bowel prominence is noted similar to that seen on the prior exam. Pessary is noted in place. Degenerative changes of lumbar spine are noted. Postsurgical changes as well as a gastrostomy are seen. IMPRESSION: Mild small bowel dilatation is noted. No free air is seen. Continued follow-up is recommended. Electronically Signed   By: Inez Catalina M.D.   On: 12/23/2018 09:50    PATHOLOGY: I have reviewed the pathology reports as documented in the oncologist history.

## 2018-12-25 NOTE — Progress Notes (Signed)
Daily Progress Note   Patient Name: Theresa Howe       Date: 12/25/2018 DOB: Jan 17, 1931  Age: 85 y.o. MRN#: [id number] Attending Physician: Albertine Grates, MD Primary Care Physician: [name], MD Admit Date: 12/21/2018  Reason for Consultation/Follow-up: Establishing goals of care  Subjective:  awake alert Now on clear liquids Tolerating well In no distress Denies pain Sitter at bedside   Length of Stay: 4  Current Medications: Scheduled Meds:  . enoxaparin (LOVENOX) injection  1 mg/kg Subcutaneous Q24H  . insulin aspart  0-9 Units Subcutaneous Q4H    Continuous Infusions: . acetaminophen Stopped (12/24/18 1400)  . dextrose 100 mL/hr at 12/25/18 0850  . fluconazole (DIFLUCAN) IV 100 mg (12/25/18 1014)    PRN Meds: acetaminophen, acetaminophen **OR** acetaminophen, morphine injection, ondansetron (ZOFRAN) IV  Physical Exam         Awake alert Abdomen is not distended She is in no distress S 1 S 2  Clear  She has bowel sounds No edema She is thin and frail  Vital Signs: BP 115/72 (BP Location: Left Arm)   Pulse 66   Temp 97.7 F (36.5 C) (Oral)   Resp 16   Ht 5\' 4"  (1.626 m)   Wt 38.1 kg   SpO2 98%   BMI 14.42 kg/m  SpO2: SpO2: 98 % O2 Device: O2 Device: Room Air O2 Flow Rate:    Intake/output summary:   Intake/Output Summary (Last 24 hours) at 12/25/2018 1256 Last data filed at 12/25/2018 4098 Gross per 24 hour  Intake 2124.05 ml  Output 850 ml  Net 1274.05 ml   LBM: Last BM Date: 12/24/18(passed 2 small hard pieces of stool with soap suds enema) Baseline Weight: Weight: 38.1 kg Most recent weight: Weight: 38.1 kg       Palliative Assessment/Data:    Flowsheet Rows     Most Recent Value  Intake Tab  Referral Department  Hospitalist  Unit  at Time of Referral  Med/Surg Unit  Palliative Care Primary Diagnosis  Cancer  Date Notified  12/22/18  Palliative Care Type  Return patient Palliative Care  Reason for referral  Clarify Goals of Care  Date of Admission  12/21/18  # of days IP prior to Palliative referral  1  Clinical Assessment  Psychosocial & Spiritual Assessment  Palliative  Care Outcomes      Patient Active Problem List   Diagnosis Date Noted  . Dehydration 12/22/2018  . Small bowel obstruction (HCC) 12/22/2018  . SBO (small bowel obstruction) (HCC) 12/21/2018  . Metastasis to liver (HCC) 11/08/2018  . Metastasis to bone (HCC) 11/08/2018  . Dementia (HCC) 10/18/2018  . Hypomagnesemia 10/07/2018  . Hypokalemia 10/07/2018  . History of DVT (deep vein thrombosis) 09/26/2018  . Partial small bowel obstruction (HCC) 09/25/2018  . Advance care planning   . On total parenteral nutrition (TPN)   . Dysphagia   . Intra-abdominal abscess (HCC)   . Oral thrush   . Palliative care by specialist   . Goals of care, counseling/discussion   . 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 Adult failure to thrive 12/24/2017  . Protein-calorie malnutrition, severe 12/06/2017  . Small bowel mass 12/04/2017  . Protein-calorie malnutrition (HCC) 09/11/2017  . Weight loss 09/11/2017  . Diabetic neuropathy (HCC) 09/28/2015  . Hip pain 09/21/2014  . Muscle ache of extremity 06/12/2014  . Anxiety state 10/06/2013  . Bladder prolapse, female, acquired 08/03/2013  . Osteoporosis, unspecified 08/03/2013  . Nonspecific (abnormal) findings on radiological and other examination of biliary tract 04/17/2013  . Diabetes with neurologic complications (HCC) 09/18/2011  . HTN (hypertension) 09/18/2011  . Hyperlipidemia LDL goal <70 09/18/2011  . 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  Patient also has a CT scan demonstrating obstruction likely due to recurrent tumor.  There are multiple metastasis in the liver.  There is involvement of the mesentery.  Recommendations/Plan:   call placed and discussed with nephew Mr Merilynn Finland, who is the patient's decision maker, patient lives with him in [place], [place].  Nephew is thankful for the information he has received, he has discussed with his cousin, the patient's niece, who discussed with surgery earlier this am.  Family now understands that there is no role for operative intervention.   We discussed about code status. Difference between full code and DNR DNI discussed in detail. Offered medical recommendation for establishing DNR DNI. Discussed frankly but compassionately, that the patient would not benefit from a resuscitative attempt, that it may cause her more harm than benefit.   We then discussed about hospice philosophy of care. Discussed about careful hand assisted feedings, initially with clears for now. Discussed about comfort measures. Difference between home with hospice versus residential hospice was also explained.   All of the nephew's questions answered to the best of my ability and to his satisfaction.   PLAN: DNR DNI Home with hospice.  Care manager consulted Continue clear liquids for now     Code Status:    Code Status Orders  (From admission, onward)         Start     Ordered   12/25/18 1256  Do not attempt resuscitation (DNR)  Continuous    Question Answer Comment  In the event of cardiac or respiratory ARREST Do not call a "code blue"   In the event  of cardiac or respiratory ARREST Do not perform Intubation, CPR, defibrillation or  ACLS   In the event of cardiac or respiratory ARREST Use medication by any route, position, wound care, and other measures to relive pain and suffering. May use oxygen, suction and manual treatment of airway obstruction as needed for comfort.      12/25/18 1256        Code Status History    Date Active Date Inactive Code Status Order ID Comments User Context   12/22/2018 0900 12/25/2018 1256 Full Code [id number]  Alberteen Sam, MD Inpatient   10/05/2018 1501 10/11/2018 2329 Full Code [id number]  Leatha Gilding, MD ED   09/26/2018 0345 10/01/2018 2125 Full Code [id number]  Briscoe Deutscher, MD Inpatient   01/26/2018 0548 02/15/2018 1731 Full Code [id number]  Ailene Ards Inpatient   12/24/2017 1120 01/26/2018 0027 Full Code [id number]  Almond Lint, MD Inpatient   12/04/2017 1344 12/13/2017 2157 Full Code [id number]  Almond Lint, MD Inpatient    Advance Directive Documentation     Most Recent Value  Type of Advance Directive  Healthcare Power of Attorney, Living will  Pre-existing out of facility DNR order (yellow form or pink MOST form)  -  "MOST" Form in Place?  -       Prognosis:   < 4 weeks  Discharge Planning:  Home with Hospice  Care plan was discussed with  Patient, sitter, RN, [facility] MD. Call placed and also discussed with nephew Mr Merilynn Finland.   Thank you for allowing the Palliative Medicine Team to assist in the care of this patient.   Time In: 12 Time Out: 12.35 Total Time 35 Prolonged Time Billed  no       Greater than 50%  of this time was spent counseling and coordinating care related to the above assessment and plan.  Rosalin Hawking, MD [phone] Please contact Palliative Medicine Team phone at [phone] for questions and concerns.

## 2018-12-25 NOTE — Care Management Note (Signed)
Case Management Note  Patient Details  Name: Theresa Howe MRN: [id number] Date of Birth: 06-19-1931  Subjective/Objective:  DNR.Received CM referral for offering home hospice choice-Spoke to niece-Theresa Howe [phone]-rep Cheri aware of referral-she will assess,& also contact Theresa-await outcome of referral. Patient was followed by [facility]/& dme-has hospital bed. Will need PTAR @ d/c.                  Action/Plan:dc home w/hospice/PTAR   Expected Discharge Date:                  Expected Discharge Plan:  Home w Hospice Care  In-House Referral:     Discharge planning Services  CM Consult  Post Acute Care Choice:  Home Health(Active w/[facility]-HHRN,aide.) Choice offered to:     DME Arranged:    DME Agency:     HH Arranged:    [place] Agency:     Status of Service:  In process, will continue to follow  If discussed at Long Length of Stay Meetings, dates discussed:    Additional Comments:  Dessa Phi, RN 12/25/2018, 3:06 PM

## 2018-12-25 NOTE — Progress Notes (Signed)
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  -General surgery consulted, no role for operative intervention, recommend palliative care  -per general surgery "She does have a gastrostomy tube in place through which they may provide a small amount of nutrition and hydration.  It may also be used for decompression for the small bowel obstruction." -palliative care consulted, family elected to go home with home hospice, case manager consulted to set up home hospice. - I discussed case with oncology Dr Dion Body who agreed with above plan.  Mild right-sided hydronephrosis with AKI on presentation Denies flank pain, cr back to baseline after hydration  at baseline  Insulin-dependent type 2 diabetes presented with hyperglycemia blood glucose on presentation 658 -She was put on insulin drip initially, and developed hypoglycemia, insulin drip stopped she is started on D5 infusion -Blood glucose has stabilized, is currently on clears, stop D5 infusion, continue SSI, hypoglycemia protocol -She is going home with home hospice, diet as tolerated  Oral thrush, she is started on IV Diflucan, topical nystatin  History of DVT, mild Eliquis held due to n.p.o. status, she is currently on Lovenox 1 mg per cake daily injection  transition to home with home hospice  Dementia: likely at baseline,  not oriented to time but to person and place, pleasant and cooperative.  Underweight, Severe malnutrition in context of chronic illness Body mass index is 14.42 kg/m.   no pressure ulcer this hospitalization     Code Status: DNR  Family Communication: patient   Disposition Plan: home with home hospice    Consultants:  General surgery  Oncology  Palliative care  hospice  Procedures:  none  Antibiotics:  none   Objective: BP 93/61 (BP Location: Left Arm)   Pulse 75   Temp 97.7 F (36.5 C) (Oral)   Resp 16   Ht 5\' 4"  (1.626 m)   Wt 38.1 kg   SpO2 100%   BMI 14.42 kg/m   Intake/Output Summary (Last 24 hours) at 12/25/2018 1432 Last data filed at 12/25/2018 1300 Gross per 24 hour  Intake 2124.05 ml  Output 850 ml  Net 1274.05 ml   Filed Weights   12/21/18 1836  Weight: 38.1 kg    Exam: Patient is examined daily including today on 12/25/2018, exams remain the same as of yesterday except that has changed    General:  Thin , chronically ill appearing, NAD, not oriented to time, but to place and person  Cardiovascular: RRR  Respiratory: CTABL  Abdomen: + peg tube, + prior midline surgical scar,  positive BS,  nontender   Musculoskeletal: No Edema  Neuro: alert, oriented to place and person, not to time  Data Reviewed: Basic Metabolic Panel: Recent Labs  Lab 12/21/18 1850 12/22/18 0310 12/23/18 0436 12/24/18 0506  NA 137 143 148* 141  K 5.4* 3.8 3.4* 3.5  CL 107 112* 113* 103  CO2 17*  27 27 32  GLUCOSE 658* 190* 305* 128*  BUN 67* 54* 34* 20  CREATININE 1.38* 0.88 0.86 0.87  CALCIUM 9.8 8.3* 7.9* 8.9   Liver Function Tests: Recent Labs  Lab 12/21/18 1850  AST 11*  ALT 9  ALKPHOS 43  BILITOT 0.4  PROT 4.1*  ALBUMIN 2.0*   Recent Labs  Lab 12/21/18 1850  LIPASE 32   No results for input(s): AMMONIA in the last 168 hours. CBC: Recent Labs  Lab 12/21/18 1850 12/23/18 0436 12/24/18 0506  WBC 6.2 4.3 3.1*  HGB 15.3*  11.1* 12.3  HCT 48.5* 35.4* 39.1  MCV 90.8 93.4 94.4  PLT 358 237 235   Cardiac Enzymes:   No results for input(s): CKTOTAL, CKMB, CKMBINDEX, TROPONINI in the last 168 hours. BNP (last 3 results) No results for input(s): BNP in the last 8760 hours.  ProBNP (last 3 results) No results for input(s): PROBNP in the last 8760 hours.  CBG: Recent Labs  Lab 12/24/18 1944 12/24/18 2345 12/25/18 0348 12/25/18 0744 12/25/18 1151  GLUCAP 98 114* 152* 181* 92    No results found for this or any previous visit (from the past 240 hour(s)).   Studies: No results found.  Scheduled Meds: . enoxaparin (LOVENOX) injection  1 mg/kg Subcutaneous Q24H  . insulin aspart  0-9 Units Subcutaneous Q4H    Continuous Infusions: . dextrose 100 mL/hr at 12/25/18 0850  . fluconazole (DIFLUCAN) IV 100 mg (12/25/18 1014)     Time spent: , case discussed with palliative care Dr. Linna Darner nd oncology Dr. Dion Body I have personally reviewed and interpreted on  12/25/2018 daily labs,  imagings as discussed above under date review session and assessment and plans.  I reviewed all nursing notes, pharmacy notes, consultant notes,  vitals, pertinent old records  I have discussed plan of care as described above with RN , patient on 12/25/2018   Albertine Grates MD, PhD  [facility] Pager [phone]. If 7PM-7AM, please contact night-coverage at [email or web address], password [facility] 12/25/2018, 2:32 PM  LOS: 4 days

## 2018-12-26 ENCOUNTER — Telehealth: Payer: Self-pay | Admitting: *Deleted

## 2018-12-26 LAB — GLUCOSE, CAPILLARY
Glucose-Capillary: 120 mg/dL — ABNORMAL HIGH (ref 70–99)
Glucose-Capillary: 120 mg/dL — ABNORMAL HIGH (ref 70–99)
Glucose-Capillary: 184 mg/dL — ABNORMAL HIGH (ref 70–99)
Glucose-Capillary: 67 mg/dL — ABNORMAL LOW (ref 70–99)
Glucose-Capillary: 90 mg/dL (ref 70–99)

## 2018-12-26 MED ORDER — INSULIN ASPART 100 UNIT/ML ~~LOC~~ SOLN: SUBCUTANEOUS | 0 refills | Status: AC

## 2018-12-26 NOTE — Care Management Note (Signed)
Case Management Note  Patient Details  Name: Theresa Howe MRN: [id number] Date of Birth: 06/03/1931  Subjective/Objective:Family chose, & has been accepted by [facility]-rep Cheri aware of d/c today-they will manage dme,& home visit today. PTAR for non emergency transp.DNR,PTAR forms in shadow chart. PTAR already scheduled for 12n pick up. No further CM needs.                    Action/Plan:dc home w/hospice/PTAR   Expected Discharge Date:  12/26/18               Expected Discharge Plan:  Home w Hospice Care  In-House Referral:     Discharge planning Services  CM Consult  Post Acute Care Choice:  Home Health(Active w/[facility]-HHRN,aide.) Choice offered to:  Adult Children  DME Arranged:    DME Agency:     HH Arranged:  RN [place] Agency:  [facility]  Status of Service:  Completed, signed off  If discussed at [employer], dates discussed:    Additional Comments:  Dessa Phi, RN 12/26/2018, 10:46 AM

## 2018-12-26 NOTE — Progress Notes (Signed)
PMT no charge note  Chart reviewed, discussed with care manager as well as hospice liaison. Appreciate their help.  Patient to go home with hospice support.  Continue comfort feedings by mouth. Prognosis few weeks at this point.  Comfort focused care. Appreciate med onc input  No additional PMT specific recommendations, ok to d.c home with hospice, once arrangements complete.   Loistine Chance MD  palliative medicine team [phone]

## 2018-12-26 NOTE — Telephone Encounter (Signed)
Received call from patient caregiver [name].   Reports that patient is to be discharged from hospital today and will have hospice eval at home. Will call if there are any other questions or concerns.   Also states that she requires letter stating that she needs caretakers for her insurance. Will fax over request.

## 2018-12-26 NOTE — Progress Notes (Signed)
[facility]: Met with sitter and pt in room. She is alert and oriented. Spoke to the niece who is aware pt will be discharged today and She will be at home after 1200 noon to receive pt by ambulance. Spoke to [facility] Case manager she has arranged for a 12 Noon pick up from [employer],. Our nurses will go to home after pt arrives to enroll in  Hospice care at home. She has all the equipment she needs in the home according to the niece. Webb Silversmith RN [phone]

## 2018-12-26 NOTE — Discharge Summary (Signed)
Discharge Summary  Theresa Howe ZOX:[id number] DOB: [birth year]  PCP: [name], MD  Admit date: 12/21/2018 Discharge date: 12/26/2018  Time spent: , more than 50% time spent on coordination of care.  Recommendations for Outpatient Follow-up:  1. Discharge home with home hospice , full comfort measures.  Discharge Diagnoses:  Active Hospital Problems   Diagnosis Date Noted  . SBO (small bowel obstruction) (HCC) 12/21/2018  . AKI (acute kidney injury) (HCC)   . Dehydration 12/22/2018  . Small bowel obstruction (HCC) 12/22/2018  . Metastasis to liver (HCC) 11/08/2018  . Metastasis to bone (HCC) 11/08/2018  . Dementia (HCC) 10/18/2018  . Oral thrush   . 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 Protein-calorie malnutrition, severe 12/06/2017  . Insulin dependent diabetes mellitus (HCC) 09/18/2011    Resolved Hospital Problems  No resolved problems to display.    Discharge Condition: stable  Diet recommendation: comfort feeds /clears as tolerated  Filed Weights   12/21/18 1836  Weight: 38.1 kg    History of present illness: (per admitting MD Dr Maryfrances Bunnell) PCP: [name], MD  Patient coming from: Home --> [facility]  Chief Complaint: Vomiting, hyperglycemia      HPI: Theresa Howe is a 85 y.o. 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  Caveat that patient is demented and all history collected from chart as no family are present.  Per report, the patient is living with daughter right now.  Over the last few weeks, she has had progressive nausea and vomiting with oral medications, especially nystatin swish and swallow.  Seen in ER on 12/30, given fluids, Zofran and discharged.    Since then, persistent vomiting, nausea.  Now vomiting  even without medications, just with attempting tube feeds, without relief from Zofran or Phenergan.  In meantime, also having rising blood sugars despite increasing insulin doses.  No fever. No abdominal pain.    ED course: - Afebrile, heart rate 112, respirations and pulse ox normal, blood pressure 120/88 -Na 137, K 5.4, Cr 1.38 (baseline 0.9), WBC 6.2 K, Hgb 15.3 - Lipase normal - Glucose 658, anion gap normal, bicarb low, VBG showed alkalosis - Lactic acid 3.0 -CT of the abdomen without contrast was obtained that showed a high-grade small bowel obstruction, large stool burden, possible right hydronephrosis, no other acute findings -She was started on an insulin drip, discussed with general surgery, and accepted by hospitalist service for admission for hyperglycemia and small bowel obstruction and acute kidney injury -Overnight, her renal function and hypoglycemia resolved with insulin drip and IV fluids   Hospital Course:  Principal Problem:   SBO (small bowel obstruction) (HCC) Active Problems:   Insulin dependent diabetes mellitus (HCC)   Protein-calorie malnutrition, severe   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  -General surgery consulted, no role for operative intervention, recommend palliative care  -per general surgery "She does have a gastrostomy tube in place through which they may provide a small amount of nutrition and hydration. It may  also be used for decompression for the small bowel obstruction." -palliative care consulted, family elected to go home with home hospice, case manager consulted to set up home hospice. - I discussed case with oncology Dr Dion Body who agreed with  above plan.  Mild right-sided hydronephrosis with AKI on presentation Denies flank pain, cr back to baseline after hydration   Insulin-dependent type 2 diabetes presented with hyperglycemia blood glucose on presentation 658 -She was put on insulin drip initially, and developed hypoglycemia, insulin drip stopped she is started on D5 infusion -Blood glucose has stabilized, is currently on clears, stop D5 infusion, continue SSI, hypoglycemia protocol -She is going home with home hospice, diet as tolerated  Oral thrush, she received IV Diflucan, topical nystatin in the hospital  History of DVT, mild Eliquis held due to n.p.o. status, she is  on Lovenox 1 mg per kg daily injection in the hospital  transition to home with home hospice  Dementia: likely at baseline, not oriented to time but to person and place, pleasant and cooperative.  Underweight, Severe malnutrition in context of chronic illness Body mass index is 14.42 kg/m.   no pressure ulcer this hospitalization     Code Status: DNR  Family Communication: patient   Disposition Plan: home with home hospice , full comfort measures   Consultants:  General surgery  Oncology  Palliative care  hospice  Procedures:  none  Antibiotics:  none  Discharge Exam: BP (!) 92/55 (BP Location: Left Arm)   Pulse 84   Temp 98.6 F (37 C) (Oral)   Resp 18   Ht 5\' 4"  (1.626 m)   Wt 38.1 kg   SpO2 98%   BMI 14.42 kg/m   General: NAD, alert, oriented to place and person, not to time Cardiovascular: RRR Respiratory: CTABL Abdomen: + peg tube, + prior midline surgical scar,  positive BS,  nontender   Discharge Instructions You were cared for by a hospitalist during your hospital stay. If you have any questions about your discharge medications or the care you received while you were in the hospital after you are discharged, you can call the unit and asked to speak with the hospitalist on call if the  hospitalist that took care of you is not available.   Discharge Instructions    Diet general   Complete by:  As directed    Comfort feeds, clear liquid Diet as tolerated   Increase activity slowly   Complete by:  As directed      Allergies as of 12/26/2018   No Known Allergies     Medication List    STOP taking these medications   Blood Glucose System Pak Kit   BLOOD GLUCOSE TEST STRIPS Strp   ELIQUIS 2.5 MG Tabs tablet Generic drug:  apixaban   fluconazole 100 MG tablet Commonly known as:  DIFLUCAN   insulin detemir 100 UNIT/ML injection Commonly known as:  LEVEMIR   Insulin Pen Needle 32G X 6 MM Misc Commonly known as:  BD PEN NEEDLE MICRO U/F   Lancets Misc   Metoclopramide HCl 5 MG Tbdp   ondansetron 4 MG disintegrating tablet Commonly known as:  ZOFRAN ODT   pantoprazole 40 MG tablet Commonly known as:  PROTONIX   promethazine 12.5 MG suppository Commonly known as:  PHENERGAN     TAKE these medications   insulin aspart 100 UNIT/ML injection Commonly known as:  novoLOG Insulin sliding scale: Blood sugar  120-150   3units  151-200   4units                       201-250   7units                       251- 300  11units                       301-350   15uints                       351-400   20units                       >400         call MD immediately      No Known Allergies Follow-up Information    [facility] Follow up.   Why:  Home nursing visit today. Contact information: [address] [place] [address] [phone]            The results of significant diagnostics from this hospitalization (including imaging, microbiology, ancillary and laboratory) are listed below for reference.    Significant Diagnostic Studies: Ct Abdomen Pelvis Wo Contrast  Result Date: 12/21/2018 CLINICAL DATA:  Acute onset of hyperglycemia. Loose stools. Nausea and vomiting. Increased sleepiness. EXAM: CT ABDOMEN AND  PELVIS WITHOUT CONTRAST TECHNIQUE: Multidetector CT imaging of the abdomen and pelvis was performed following the standard protocol without IV contrast. COMPARISON:  PET/CT performed 11/08/2018 FINDINGS: Lower chest: Mild right basilar atelectasis is noted. The visualized portions of the mediastinum are unremarkable. Hepatobiliary: Known hepatic metastases are again noted, though less well characterized on this study. The gallbladder is unremarkable in appearance. The common bile duct remains normal in caliber. Pancreas: The pancreas is within normal limits. Spleen: The spleen is unremarkable in appearance. Adrenals/Urinary Tract: The adrenal glands are unremarkable in appearance. Mild right-sided hydronephrosis is noted, with mild prominence of the right ureter. This could reflect distal obstruction, difficult to fully characterize. A nonobstructing 3 mm stone is noted at the lower pole of the right kidney. The left kidney is unremarkable in appearance. No perinephric stranding is seen. Stomach/Bowel: There is diffuse dilatation of small-bowel loops to 6.0 cm in maximal diameter, with a focal transition point at the upper pelvis, which may reflect adhesions and associated metastatic disease given underlying mesenteric thickening, on correlation with prior PET/CT. This is concerning for relatively high-grade small bowel obstruction, worsened from the prior study. Distal small bowel loops are decompressed, with surrounding postoperative change. The colon is largely decompressed, aside from a very large amount of stool at the rectum, which measures 9.8 cm in transverse dimension, concerning for fecal impaction. A G-tube is noted ending at the body of the stomach. The stomach is largely decompressed and grossly unremarkable. Vascular/Lymphatic: Scattered calcification is seen along the abdominal aorta and its branches. The abdominal aorta is otherwise grossly unremarkable. The inferior vena cava is grossly  unremarkable. No retroperitoneal lymphadenopathy is seen. No pelvic sidewall lymphadenopathy is identified. Reproductive: The bladder is mildly distended and grossly unremarkable. A ring pessary is noted at the pelvis. The patient is status post hysterectomy. No suspicious adnexal masses are seen. Other: No additional soft tissue abnormalities are seen. Musculoskeletal: No acute osseous abnormalities are identified. Known metastatic lesions within the lower thoracic and lumbar spine, and within the sacrum, are occult on CT. Multilevel vacuum phenomenon is noted  along the lumbar spine, with endplate degenerative change and underlying facet disease. The visualized musculature is unremarkable in appearance. IMPRESSION: 1. Diffuse dilatation of small-bowel loops to 6.0 cm in maximal diameter, with a focal transition point at the upper pelvis, which may reflect adhesions and associated metastatic disease given underlying mesenteric thickening, on correlation with prior PET/CT. This is concerning for high-grade small bowel obstruction, worsened from the prior study. 2. Very large amount of stool noted at the rectum, which measures 9.8 cm in transverse dimension, concerning for fecal impaction. 3. Mild right-sided hydronephrosis, with mild prominence of the right ureter. This could reflect distal obstruction, difficult to fully characterize. 4. Known hepatic metastases again noted, though less well characterized on this study. 5. Known metastatic lesions within the lower thoracic and lumbar spine, and within the sacrum, are occult on CT. 6. Nonobstructing 3 mm stone at the lower pole of the right kidney. Aortic Atherosclerosis (ICD10-I70.0). These results were called by telephone at the time of interpretation on 12/21/2018 at 11:00 pm to Dr. Arby Barrette, who verbally acknowledged these results. Electronically Signed   By: Roanna Raider M.D.   On: 12/21/2018 23:06   Dg Abd 1 View  Result Date: 12/24/2018 CLINICAL DATA:   Follow-up small bowel obstruction EXAM: ABDOMEN - 1 VIEW COMPARISON:  12/23/2018 FINDINGS: Gastrostomy tube projects over the left upper quadrant. Dilated bowel loops in the abdomen and pelvis appear to reflect dilated small bowel compatible with small bowel obstruction. No organomegaly or free air. IMPRESSION: Continued small bowel obstruction pattern, not significantly changed. Electronically Signed   By: Charlett Nose M.D.   On: 12/24/2018 08:57   Dg Abd 1 View  Result Date: 12/23/2018 CLINICAL DATA:  Abdominal pain EXAM: ABDOMEN - 1 VIEW COMPARISON:  12/21/2018 FINDINGS: Scattered large and small bowel gas is noted. Mild small bowel prominence is noted similar to that seen on the prior exam. Pessary is noted in place. Degenerative changes of lumbar spine are noted. Postsurgical changes as well as a gastrostomy are seen. IMPRESSION: Mild small bowel dilatation is noted. No free air is seen. Continued follow-up is recommended. Electronically Signed   By: Alcide Clever M.D.   On: 12/23/2018 09:50    Microbiology: No results found for this or any previous visit (from the past 240 hour(s)).   Labs: Basic Metabolic Panel: Recent Labs  Lab 12/21/18 1850 12/22/18 0310 12/23/18 0436 12/24/18 0506  NA 137 143 148* 141  K 5.4* 3.8 3.4* 3.5  CL 107 112* 113* 103  CO2 17* 27 27 32  GLUCOSE 658* 190* 305* 128*  BUN 67* 54* 34* 20  CREATININE 1.38* 0.88 0.86 0.87  CALCIUM 9.8 8.3* 7.9* 8.9   Liver Function Tests: Recent Labs  Lab 12/21/18 1850  AST 11*  ALT 9  ALKPHOS 43  BILITOT 0.4  PROT 4.1*  ALBUMIN 2.0*   Recent Labs  Lab 12/21/18 1850  LIPASE 32   No results for input(s): AMMONIA in the last 168 hours. CBC: Recent Labs  Lab 12/21/18 1850 12/23/18 0436 12/24/18 0506  WBC 6.2 4.3 3.1*  HGB 15.3* 11.1* 12.3  HCT 48.5* 35.4* 39.1  MCV 90.8 93.4 94.4  PLT 358 237 235   Cardiac Enzymes: No results for input(s): CKTOTAL, CKMB, CKMBINDEX, TROPONINI in the last 168  hours. BNP: BNP (last 3 results) No results for input(s): BNP in the last 8760 hours.  ProBNP (last 3 results) No results for input(s): PROBNP in the last 8760 hours.  CBG:  Recent Labs  Lab 12/25/18 2012 12/26/18 0017 12/26/18 0041 12/26/18 0340 12/26/18 0748  GLUCAP 80 67* 90 120* 120*       Signed:  Albertine Grates MD, PhD  [facility] 12/26/2018, 11:53 AM

## 2018-12-27 NOTE — Telephone Encounter (Signed)
Okay to complete?

## 2018-12-31 NOTE — Telephone Encounter (Signed)
Received form from caregiver, Theresa Howe. States that she is trying to set up funeral expenses to be paid out of life insurance with [employer], but they require documentation for competency determination to allow caregivers to make those decisions.   MD please advise.   Letter routed to PCP.

## 2019-01-02 ENCOUNTER — Encounter: Payer: Self-pay | Admitting: Family Medicine

## 2019-01-07 ENCOUNTER — Other Ambulatory Visit: Payer: Self-pay | Admitting: Family Medicine

## 2019-01-09 ENCOUNTER — Other Ambulatory Visit: Payer: Self-pay | Admitting: Family Medicine

## 2019-01-10 ENCOUNTER — Ambulatory Visit: Payer: Medicare Other

## 2019-01-31 ENCOUNTER — Other Ambulatory Visit: Payer: Medicare Other

## 2019-01-31 ENCOUNTER — Ambulatory Visit: Payer: Medicare Other

## 2019-01-31 ENCOUNTER — Ambulatory Visit: Payer: Medicare Other | Admitting: Hematology

## 2019-02-13 LAB — BLOOD GAS, VENOUS

## 2019-02-16 DEATH — deceased

## 2019-03-07 ENCOUNTER — Ambulatory Visit: Payer: Medicare Other

## 2019-04-04 ENCOUNTER — Ambulatory Visit: Payer: Medicare Other

## 2019-05-02 ENCOUNTER — Ambulatory Visit: Payer: Medicare Other

## 2019-07-18 ENCOUNTER — Other Ambulatory Visit: Payer: Self-pay
